# Patient Record
Sex: Male | Born: 1940
Health system: Southern US, Community
[De-identification: ages and names within clinical notes are randomized; demographics above are authoritative.]

## PROBLEM LIST (undated history)

## (undated) DIAGNOSIS — N529 Male erectile dysfunction, unspecified: Secondary | ICD-10-CM

## (undated) DIAGNOSIS — I241 Dressler's syndrome: Secondary | ICD-10-CM

## (undated) DIAGNOSIS — I00 Rheumatic fever without heart involvement: Secondary | ICD-10-CM

## (undated) DIAGNOSIS — R7303 Prediabetes: Secondary | ICD-10-CM

## (undated) DIAGNOSIS — G8929 Other chronic pain: Secondary | ICD-10-CM

## (undated) DIAGNOSIS — R51 Headache: Secondary | ICD-10-CM

## (undated) DIAGNOSIS — I255 Ischemic cardiomyopathy: Secondary | ICD-10-CM

## (undated) DIAGNOSIS — I480 Paroxysmal atrial fibrillation: Secondary | ICD-10-CM

## (undated) DIAGNOSIS — R519 Headache, unspecified: Secondary | ICD-10-CM

## (undated) DIAGNOSIS — I2510 Atherosclerotic heart disease of native coronary artery without angina pectoris: Secondary | ICD-10-CM

## (undated) DIAGNOSIS — R430 Anosmia: Secondary | ICD-10-CM

## (undated) DIAGNOSIS — R7989 Other specified abnormal findings of blood chemistry: Secondary | ICD-10-CM

## (undated) DIAGNOSIS — K760 Fatty (change of) liver, not elsewhere classified: Secondary | ICD-10-CM

## (undated) DIAGNOSIS — C61 Malignant neoplasm of prostate: Secondary | ICD-10-CM

## (undated) HISTORY — DX: Headache: R51

## (undated) HISTORY — DX: Rheumatic fever without heart involvement: I00

## (undated) HISTORY — PX: TONSILLECTOMY: SUR1361

## (undated) HISTORY — DX: Headache, unspecified: R51.9

## (undated) HISTORY — PX: PROSTATECTOMY: SHX69

## (undated) HISTORY — DX: Other chronic pain: G89.29

## (undated) HISTORY — DX: Male erectile dysfunction, unspecified: N52.9

## (undated) HISTORY — DX: Malignant neoplasm of prostate: C61

## (undated) HISTORY — DX: Fatty (change of) liver, not elsewhere classified: K76.0

## (undated) HISTORY — DX: Anosmia: R43.0

---
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
## 2009-07-07 ENCOUNTER — Emergency Department (HOSPITAL_COMMUNITY): Admission: EM | Admit: 2009-07-07 | Discharge: 2009-07-07 | Payer: Self-pay | Admitting: Family Medicine

## 2010-08-21 ENCOUNTER — Encounter: Payer: Self-pay | Admitting: Internal Medicine

## 2010-09-04 ENCOUNTER — Encounter: Payer: Self-pay | Admitting: Internal Medicine

## 2010-09-05 ENCOUNTER — Encounter: Payer: Self-pay | Admitting: Internal Medicine

## 2010-09-05 ENCOUNTER — Ambulatory Visit
Admission: RE | Admit: 2010-09-05 | Discharge: 2010-09-05 | Payer: Self-pay | Source: Home / Self Care | Attending: Internal Medicine | Admitting: Internal Medicine

## 2010-09-12 ENCOUNTER — Ambulatory Visit: Admission: EM | Admit: 2010-09-12 | Payer: Self-pay | Source: Home / Self Care | Admitting: Cardiology

## 2010-09-18 ENCOUNTER — Emergency Department (HOSPITAL_COMMUNITY): Payer: Medicare Other

## 2010-09-18 ENCOUNTER — Emergency Department (HOSPITAL_COMMUNITY)
Admission: EM | Admit: 2010-09-18 | Discharge: 2010-09-18 | Disposition: A | Discharge status: Home / Self Care | Payer: Medicare Other | Attending: Emergency Medicine | Admitting: Emergency Medicine

## 2010-09-18 DIAGNOSIS — R079 Chest pain, unspecified: Secondary | ICD-10-CM | POA: Insufficient documentation

## 2010-09-18 DIAGNOSIS — R0609 Other forms of dyspnea: Secondary | ICD-10-CM | POA: Insufficient documentation

## 2010-09-18 DIAGNOSIS — R0989 Other specified symptoms and signs involving the circulatory and respiratory systems: Secondary | ICD-10-CM | POA: Insufficient documentation

## 2010-09-18 DIAGNOSIS — Z7982 Long term (current) use of aspirin: Secondary | ICD-10-CM | POA: Insufficient documentation

## 2010-09-18 LAB — CBC
HCT: 43.8 % (ref 39.0–52.0)
Hemoglobin: 14.3 g/dL (ref 13.0–17.0)
RBC: 4.92 MIL/uL (ref 4.22–5.81)
WBC: 8.2 10*3/uL (ref 4.0–10.5)

## 2010-09-18 LAB — DIFFERENTIAL
Basophils Absolute: 0 10*3/uL (ref 0.0–0.1)
Lymphocytes Relative: 30 % (ref 12–46)
Monocytes Absolute: 0.8 10*3/uL (ref 0.1–1.0)
Neutro Abs: 4.7 10*3/uL (ref 1.7–7.7)

## 2010-09-18 LAB — BASIC METABOLIC PANEL
Calcium: 9.3 mg/dL (ref 8.4–10.5)
GFR calc Af Amer: >60 mL/min (ref >60)
GFR calc non Af Amer: >60 mL/min (ref >60)
Sodium: 145 mEq/L (ref 135–145)

## 2010-09-18 LAB — POCT CARDIAC MARKERS
CKMB, poc: 1.8 ng/mL (ref 1.0–8.0)
Myoglobin, poc: 63.3 ng/mL (ref 12–200)
Troponin i, poc: <0.05 ng/mL (ref 0.00–0.09)

## 2010-09-25 NOTE — Assessment & Plan Note (Signed)
Summary: np6/chest pains   Visit Type:  Initial Consult Primary Provider:  Dr Shon Baton  CC:  chest tightness recently.  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 Referred by Dr. [name] for further evaluation of CP.  Denies any h/o known HTN, HL, diabetes, or known CAD. Has never had stress test or cath. Brother has CAD and had CABG at age 3.  Had CT scan chest in 2007 as part of work-up for porstate CA dn told he had "hardening of the arteries."  Chest discomfort began about 2 months ago. Feels as if chest gets tight and constricted. Initially was getting it 3-4x/week which he related to stress. Would last 2-3 hours then resolve. No associated symptoms. Now that stressis better has not had any further episode for 3-4 weeks.   Does not exercise regularly but walks frequently as a real estate and has not had an episode.    Problems Prior to Update: 1)  Chest Pain-unspecified  (ICD-786.50)  Medications Prior to Update: 1)  Vitamin C 500 Mg  Tabs (Ascorbic Acid) .... Once Daily 2)  Aspirin 81 Mg Tbec (Aspirin) .... Take One Tablet By Mouth Daily 3)  Indomethacin Cr 75 Mg Cr-Caps (Indomethacin) .... Two Times A Day As Needed  Current Medications (verified): 1)  Vitamin C 500 Mg  Tabs (Ascorbic Acid) .... Once Daily 2)  Aspirin 81 Mg Tbec (Aspirin) .... Take One Tablet By Mouth Daily 3)  Indomethacin Cr 75 Mg Cr-Caps (Indomethacin) .... Two Times A Day As Needed  Allergies (verified): 1)  ! Pcn  Past History:  Past Medical History: Last updated: 09/04/2010 1. Chest pains 2. Prostate Cancer 3. Gout 4. Rheumatic fever @5  in 1947 5. Hx of Chest tube 6. Headaches 7. Fatty liver 8. Anosmia 9. ED  Past Surgical History: Last updated: 09/04/2010 Prostatectomy Tonsillectomy  Family History: Last updated: 09/05/2010 Family History of Cancer:  Family  History of Coronary Artery Disease:  Family History of Diabetes:  Family History of Hyperlipidemia:  Dad died colon CA 41 DM2, HTN, gout Mom 49 liver and kidney cance Brother CAD with CABG age 64  Social History: Last updated: 09/04/2010 Retired  Tobacco Use - Former.  1994 Alcohol Use - yes -- social Regular Exercise - no Drug Use - no  Risk Factors: Exercise: no (09/04/2010)  Risk Factors: Smoking Status: quit (09/04/2010)  Family History: Reviewed history from 09/04/2010 and no changes required. Family History of Cancer:  Family History of Coronary Artery Disease:  Family History of Diabetes:  Family History of Hyperlipidemia:  Dad died colon CA 25 DM2, HTN, gout Mom 80 liver and kidney cance Brother CAD with CABG age 37  Social History: Reviewed history from 09/04/2010 and no changes required. Retired  Tobacco Use - Former.  1994 Alcohol Use - yes -- social Regular Exercise - no Drug Use - no  Review of Systems       As per HPI and past medical history; otherwise all systems negative.   Vital Signs:  Patient profile:   70 year old male Height:      68.5 inches Weight:      178 pounds BMI:     26.77 Pulse rate:   63 / minute BP sitting:   140 / 66  (left arm) Cuff size:   regular  Vitals Entered By: Mignon Pine, RMA (September 05, 2010 3:28  PM)  Physical Exam  General:  Well appearing. no resp difficulty HEENT: normal Neck: supple. no JVD. Carotids 2+ bilat; no bruits. No lymphadenopathy or thryomegaly appreciated. Cor: PMI nondisplaced. Regular rate & rhythm. No rubs, gallops, murmur. Lungs: clear Abdomen: soft, nontender, nondistended. No hepatosplenomegaly. No bruits or masses. Good bowel sounds. Extremities: no cyanosis, clubbing, rash, edema Neuro: alert & orientedx3, cranial nerves grossly intact. moves all 4 extremities w/o difficulty. affect pleasant    Impression & Recommendations:  Problem # 1:  CHEST PAIN-UNSPECIFIED  (ICD-786.50) CP with typical and atypical features with strong FHX of CAD. Will ned further risk stratifciation. Discussed enrollment into the NIH Promise trial (randomized trial of stress testing vs cardiac CT). He is interested in proceeding will have him screened for enrollment. If testing is + will need cath. Continue ASA 81.   Other Orders: EKG w/ Interpretation (93000) Treadmill (Treadmill)  Patient Instructions: 1)  Your physician has requested that you have an exercise tolerance test.  For further information please visit [email or web address].  Please also follow instruction sheet, as given. 2)  Follow up in 2 months.

## 2010-09-26 ENCOUNTER — Encounter (INDEPENDENT_AMBULATORY_CARE_PROVIDER_SITE_OTHER): Payer: Medicare Other

## 2010-09-26 ENCOUNTER — Encounter: Payer: Self-pay | Admitting: Internal Medicine

## 2010-09-26 ENCOUNTER — Encounter (INDEPENDENT_AMBULATORY_CARE_PROVIDER_SITE_OTHER): Payer: Medicare Other | Admitting: Internal Medicine

## 2010-09-26 DIAGNOSIS — R0989 Other specified symptoms and signs involving the circulatory and respiratory systems: Secondary | ICD-10-CM

## 2010-09-26 DIAGNOSIS — R072 Precordial pain: Secondary | ICD-10-CM

## 2010-10-04 ENCOUNTER — Encounter: Payer: Self-pay | Admitting: Internal Medicine

## 2010-10-09 NOTE — Consult Note (Signed)
Summary: [facility].: Consultation Report  [facility] Consultation Report   Imported By: Roddie Mc 10/04/2010 15:36:40  _____________________________________________________________________  External Attachment:    Type:   Image     Comment:   External Document

## 2010-10-09 NOTE — Letter (Signed)
Summary: [facility] Pt History  [facility] Pt History   Imported By: Roddie Mc 10/04/2010 15:28:10  _____________________________________________________________________  External Attachment:    Type:   Image     Comment:   External Document

## 2010-10-15 NOTE — Medication Information (Signed)
Summary: [facility]: RX Folder  [facility]: RX Folder   Imported By: Roddie Mc 10/04/2010 15:35:36  _____________________________________________________________________  External Attachment:    Type:   Image     Comment:   External Document

## 2010-10-24 ENCOUNTER — Encounter (INDEPENDENT_AMBULATORY_CARE_PROVIDER_SITE_OTHER): Payer: Self-pay | Admitting: *Deleted

## 2010-10-29 ENCOUNTER — Encounter: Payer: Self-pay | Admitting: Internal Medicine

## 2010-10-29 NOTE — Letter (Signed)
Summary: Appointment - [place], [place]  [address] [place]   [place], [place] [address]   Phone: [phone]  Fax: [phone]     October 24, 2010 MRN: [id number]   Pedro Pope [place], [place]  [address]   Dear Pedro Pope,   Due to a change in our office schedule, your appointment on March 27,2012 at 11:15 must be changed.  It is very important that we reach you to reschedule this appointment. We look forward to participating in your health care needs. Please contact us at the number listed above at your earliest convenience to reschedule this appointment.     Sincerely, Designer, fashion/clothing

## 2010-11-12 ENCOUNTER — Ambulatory Visit: Payer: Self-pay | Admitting: Internal Medicine

## 2010-11-25 ENCOUNTER — Ambulatory Visit: Payer: Self-pay | Admitting: Internal Medicine

## 2010-12-12 ENCOUNTER — Ambulatory Visit: Payer: Self-pay | Admitting: Internal Medicine

## 2011-01-06 ENCOUNTER — Ambulatory Visit (INDEPENDENT_AMBULATORY_CARE_PROVIDER_SITE_OTHER): Payer: Medicare Other | Admitting: Internal Medicine

## 2011-01-06 ENCOUNTER — Encounter: Payer: Self-pay | Admitting: Internal Medicine

## 2011-01-06 VITALS — BP 130/68 | HR 58 | Resp 18 | Ht 69.0 in | Wt 177.8 lb

## 2011-01-06 DIAGNOSIS — R079 Chest pain, unspecified: Secondary | ICD-10-CM

## 2011-01-06 NOTE — Assessment & Plan Note (Signed)
Resolved. Treadmill without any evidence of ischemia though exercise tolerance not as good as I would have hoped. My suspicion is that he does have some degree of underlying vascular disease but so far seems to be doing well.  We discussed the possibility of starting a statin to reduce risk of plaque development/rupture but he prefers not to do this. I discussed with Dr. [name] who has followed him closely and recent TC 177 with LDL ~ 100. He will continue to follow and encourage ongoing exercise and diet efforts. Can consider red yeast rice as an adjunct. I will see him back in 6 months.

## 2011-01-06 NOTE — Progress Notes (Signed)
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 Referred by Dr. [name] in 1/12 for further evaluation of CP.  Denies any h/o known HTN, HL, diabetes, or known CAD.  Brother has CAD and had CABG at age 30. He had chest CT in past and was told he had "hardening of his heart arteries."  In February has ETT. Went 6:13 on Bruce protocol. No CP or ECG changes. SBP at peak 220.   Chest discomfort began about 2 months ago. Feels as if chest gets tight and constricted. Initially was getting it 3-4x/week which he related to stress. Would last 2-3 hours then resolve. No associated symptoms. Now that stressis better has not had any further episode for 3-4 weeks.   Returns for routine f/u. After ETT started walking program. Was up to 2.5 miles per day in [facility]  but schedule got too busy. Now walks 1.5 miles. Does it every day. No CP at all. Breathing OK. Feels his CP was due to emotional stress. Continues selling real estate.  ROS: All systems negative except as listed in HPI, PMH and Problem List.  Past Medical History  Diagnosis Date  . Chest pain   . Prostate cancer   . Gout   . Rheumatic fever 1947  . History of chest tube placement   . Chronic headaches   . Fatty liver   . Anosmia   . ED (erectile dysfunction)     Current Outpatient Prescriptions  Medication Sig Dispense Refill  . Ascorbic Acid (VITAMIN C) 500 MG tablet Take 500 mg by mouth daily.        Marland Kitchen aspirin 81 MG tablet Take 81 mg by mouth daily.        . indomethacin (INDOCIN SR) 75 MG CR capsule Take 75 mg by mouth as needed.          PHYSICAL EXAM: Filed Vitals:   01/06/11 1052  BP: 130/68  Pulse: 58  Resp: 18   General:  Well appearing. No resp difficulty HEENT: normal Neck: supple. JVP flat. Carotids 2+ bilaterally; no bruits. No lymphadenopathy or thryomegaly appreciated. Cor: PMI normal. Regular rate & rhythm.  No rubs, gallops or murmurs. Lungs: clear Abdomen: soft, nontender, nondistended. No hepatosplenomegaly. No bruits or masses. Good bowel sounds. Extremities: no cyanosis, clubbing, rash, edema Neuro: alert & orientedx3, cranial nerves grossly intact. Moves all 4 extremities w/o difficulty. Affect pleasant.    ECG: Sinus bradycardia 58 No ST-T wave abnormalities.     ASSESSMENT & PLAN:

## 2011-01-06 NOTE — Patient Instructions (Signed)
Your physician wants you to follow-up in:  6 months. You will receive a reminder letter in the mail two months in advance. If you don't receive a letter, please call our office to schedule the follow-up appointment.   

## 2011-11-10 DIAGNOSIS — R030 Elevated blood-pressure reading, without diagnosis of hypertension: Secondary | ICD-10-CM | POA: Diagnosis not present

## 2011-11-10 DIAGNOSIS — M109 Gout, unspecified: Secondary | ICD-10-CM | POA: Diagnosis not present

## 2011-11-10 DIAGNOSIS — K7689 Other specified diseases of liver: Secondary | ICD-10-CM | POA: Diagnosis not present

## 2011-11-10 DIAGNOSIS — K5900 Constipation, unspecified: Secondary | ICD-10-CM | POA: Diagnosis not present

## 2011-12-04 DIAGNOSIS — Z800 Family history of malignant neoplasm of digestive organs: Secondary | ICD-10-CM | POA: Diagnosis not present

## 2011-12-04 DIAGNOSIS — K5900 Constipation, unspecified: Secondary | ICD-10-CM | POA: Diagnosis not present

## 2011-12-04 DIAGNOSIS — R109 Unspecified abdominal pain: Secondary | ICD-10-CM | POA: Diagnosis not present

## 2012-01-15 DIAGNOSIS — R1084 Generalized abdominal pain: Secondary | ICD-10-CM | POA: Diagnosis not present

## 2012-01-15 DIAGNOSIS — K5730 Diverticulosis of large intestine without perforation or abscess without bleeding: Secondary | ICD-10-CM | POA: Diagnosis not present

## 2012-01-15 DIAGNOSIS — K5900 Constipation, unspecified: Secondary | ICD-10-CM | POA: Diagnosis not present

## 2012-01-15 DIAGNOSIS — Z800 Family history of malignant neoplasm of digestive organs: Secondary | ICD-10-CM | POA: Diagnosis not present

## 2012-01-15 DIAGNOSIS — Z1211 Encounter for screening for malignant neoplasm of colon: Secondary | ICD-10-CM | POA: Diagnosis not present

## 2012-01-15 DIAGNOSIS — Q438 Other specified congenital malformations of intestine: Secondary | ICD-10-CM | POA: Diagnosis not present

## 2012-03-11 DIAGNOSIS — C61 Malignant neoplasm of prostate: Secondary | ICD-10-CM | POA: Diagnosis not present

## 2012-05-13 DIAGNOSIS — K7689 Other specified diseases of liver: Secondary | ICD-10-CM | POA: Diagnosis not present

## 2012-05-13 DIAGNOSIS — R030 Elevated blood-pressure reading, without diagnosis of hypertension: Secondary | ICD-10-CM | POA: Diagnosis not present

## 2012-05-13 DIAGNOSIS — Z0000 Encounter for general adult medical examination without abnormal findings: Secondary | ICD-10-CM | POA: Diagnosis not present

## 2012-05-13 DIAGNOSIS — M109 Gout, unspecified: Secondary | ICD-10-CM | POA: Diagnosis not present

## 2012-05-20 DIAGNOSIS — Z0000 Encounter for general adult medical examination without abnormal findings: Secondary | ICD-10-CM | POA: Diagnosis not present

## 2012-05-20 DIAGNOSIS — K5900 Constipation, unspecified: Secondary | ICD-10-CM | POA: Diagnosis not present

## 2012-05-20 DIAGNOSIS — M1990 Unspecified osteoarthritis, unspecified site: Secondary | ICD-10-CM | POA: Diagnosis not present

## 2012-05-20 DIAGNOSIS — K7689 Other specified diseases of liver: Secondary | ICD-10-CM | POA: Diagnosis not present

## 2012-05-20 DIAGNOSIS — Z23 Encounter for immunization: Secondary | ICD-10-CM | POA: Diagnosis not present

## 2012-12-28 DIAGNOSIS — K7689 Other specified diseases of liver: Secondary | ICD-10-CM | POA: Diagnosis not present

## 2012-12-28 DIAGNOSIS — R51 Headache: Secondary | ICD-10-CM | POA: Diagnosis not present

## 2012-12-28 DIAGNOSIS — R209 Unspecified disturbances of skin sensation: Secondary | ICD-10-CM | POA: Diagnosis not present

## 2012-12-28 DIAGNOSIS — R030 Elevated blood-pressure reading, without diagnosis of hypertension: Secondary | ICD-10-CM | POA: Diagnosis not present

## 2012-12-29 DIAGNOSIS — R51 Headache: Secondary | ICD-10-CM | POA: Diagnosis not present

## 2012-12-29 DIAGNOSIS — R209 Unspecified disturbances of skin sensation: Secondary | ICD-10-CM | POA: Diagnosis not present

## 2013-01-03 DIAGNOSIS — R209 Unspecified disturbances of skin sensation: Secondary | ICD-10-CM | POA: Diagnosis not present

## 2013-01-03 DIAGNOSIS — R51 Headache: Secondary | ICD-10-CM | POA: Diagnosis not present

## 2013-02-22 DIAGNOSIS — D1039 Benign neoplasm of other parts of mouth: Secondary | ICD-10-CM | POA: Diagnosis not present

## 2013-03-11 DIAGNOSIS — N529 Male erectile dysfunction, unspecified: Secondary | ICD-10-CM | POA: Diagnosis not present

## 2013-03-11 DIAGNOSIS — C61 Malignant neoplasm of prostate: Secondary | ICD-10-CM | POA: Diagnosis not present

## 2013-03-11 DIAGNOSIS — Z8546 Personal history of malignant neoplasm of prostate: Secondary | ICD-10-CM | POA: Diagnosis not present

## 2013-03-11 DIAGNOSIS — Z9079 Acquired absence of other genital organ(s): Secondary | ICD-10-CM | POA: Diagnosis not present

## 2013-03-11 DIAGNOSIS — Z923 Personal history of irradiation: Secondary | ICD-10-CM | POA: Diagnosis not present

## 2013-03-11 DIAGNOSIS — Z09 Encounter for follow-up examination after completed treatment for conditions other than malignant neoplasm: Secondary | ICD-10-CM | POA: Diagnosis not present

## 2013-03-14 DIAGNOSIS — K1321 Leukoplakia of oral mucosa, including tongue: Secondary | ICD-10-CM | POA: Diagnosis not present

## 2013-03-17 DIAGNOSIS — K1321 Leukoplakia of oral mucosa, including tongue: Secondary | ICD-10-CM | POA: Diagnosis not present

## 2013-05-17 DIAGNOSIS — M109 Gout, unspecified: Secondary | ICD-10-CM | POA: Diagnosis not present

## 2013-05-17 DIAGNOSIS — R7309 Other abnormal glucose: Secondary | ICD-10-CM | POA: Diagnosis not present

## 2013-08-29 ENCOUNTER — Encounter: Payer: Self-pay | Admitting: Internal Medicine

## 2013-09-01 DIAGNOSIS — K7689 Other specified diseases of liver: Secondary | ICD-10-CM | POA: Diagnosis not present

## 2013-09-01 DIAGNOSIS — Z0000 Encounter for general adult medical examination without abnormal findings: Secondary | ICD-10-CM | POA: Diagnosis not present

## 2013-09-01 DIAGNOSIS — R51 Headache: Secondary | ICD-10-CM | POA: Diagnosis not present

## 2013-09-01 DIAGNOSIS — Z1331 Encounter for screening for depression: Secondary | ICD-10-CM | POA: Diagnosis not present

## 2013-09-01 DIAGNOSIS — K5900 Constipation, unspecified: Secondary | ICD-10-CM | POA: Diagnosis not present

## 2013-09-01 DIAGNOSIS — R030 Elevated blood-pressure reading, without diagnosis of hypertension: Secondary | ICD-10-CM | POA: Diagnosis not present

## 2013-09-01 DIAGNOSIS — M109 Gout, unspecified: Secondary | ICD-10-CM | POA: Diagnosis not present

## 2013-09-01 DIAGNOSIS — R7309 Other abnormal glucose: Secondary | ICD-10-CM | POA: Diagnosis not present

## 2013-09-01 DIAGNOSIS — M1990 Unspecified osteoarthritis, unspecified site: Secondary | ICD-10-CM | POA: Diagnosis not present

## 2013-09-23 DIAGNOSIS — C61 Malignant neoplasm of prostate: Secondary | ICD-10-CM | POA: Diagnosis not present

## 2014-03-10 DIAGNOSIS — C61 Malignant neoplasm of prostate: Secondary | ICD-10-CM | POA: Diagnosis not present

## 2014-03-10 DIAGNOSIS — N529 Male erectile dysfunction, unspecified: Secondary | ICD-10-CM | POA: Diagnosis not present

## 2014-05-18 DIAGNOSIS — S93602A Unspecified sprain of left foot, initial encounter: Secondary | ICD-10-CM | POA: Diagnosis not present

## 2014-09-05 DIAGNOSIS — M109 Gout, unspecified: Secondary | ICD-10-CM | POA: Diagnosis not present

## 2014-09-05 DIAGNOSIS — Z125 Encounter for screening for malignant neoplasm of prostate: Secondary | ICD-10-CM | POA: Diagnosis not present

## 2014-09-05 DIAGNOSIS — K5900 Constipation, unspecified: Secondary | ICD-10-CM | POA: Diagnosis not present

## 2014-09-05 DIAGNOSIS — K760 Fatty (change of) liver, not elsewhere classified: Secondary | ICD-10-CM | POA: Diagnosis not present

## 2014-09-05 DIAGNOSIS — R739 Hyperglycemia, unspecified: Secondary | ICD-10-CM | POA: Diagnosis not present

## 2014-09-05 DIAGNOSIS — Z0000 Encounter for general adult medical examination without abnormal findings: Secondary | ICD-10-CM | POA: Diagnosis not present

## 2014-09-21 DIAGNOSIS — K760 Fatty (change of) liver, not elsewhere classified: Secondary | ICD-10-CM | POA: Diagnosis not present

## 2014-09-21 DIAGNOSIS — R030 Elevated blood-pressure reading, without diagnosis of hypertension: Secondary | ICD-10-CM | POA: Diagnosis not present

## 2014-09-21 DIAGNOSIS — R739 Hyperglycemia, unspecified: Secondary | ICD-10-CM | POA: Diagnosis not present

## 2014-09-21 DIAGNOSIS — Z0000 Encounter for general adult medical examination without abnormal findings: Secondary | ICD-10-CM | POA: Diagnosis not present

## 2014-09-21 DIAGNOSIS — E781 Pure hyperglyceridemia: Secondary | ICD-10-CM | POA: Diagnosis not present

## 2014-09-21 DIAGNOSIS — Z6825 Body mass index (BMI) 25.0-25.9, adult: Secondary | ICD-10-CM | POA: Diagnosis not present

## 2014-09-21 DIAGNOSIS — Z1389 Encounter for screening for other disorder: Secondary | ICD-10-CM | POA: Diagnosis not present

## 2014-09-21 DIAGNOSIS — M1990 Unspecified osteoarthritis, unspecified site: Secondary | ICD-10-CM | POA: Diagnosis not present

## 2014-09-21 DIAGNOSIS — M109 Gout, unspecified: Secondary | ICD-10-CM | POA: Diagnosis not present

## 2014-09-21 DIAGNOSIS — C61 Malignant neoplasm of prostate: Secondary | ICD-10-CM | POA: Diagnosis not present

## 2014-09-22 DIAGNOSIS — C61 Malignant neoplasm of prostate: Secondary | ICD-10-CM | POA: Diagnosis not present

## 2014-10-04 DIAGNOSIS — Z1212 Encounter for screening for malignant neoplasm of rectum: Secondary | ICD-10-CM | POA: Diagnosis not present

## 2014-10-30 ENCOUNTER — Encounter (HOSPITAL_COMMUNITY)
Admission: EM | Disposition: A | Discharge status: Home / Self Care | Payer: PRIVATE HEALTH INSURANCE | Source: Ambulatory Visit | Attending: Cardiovascular Disease

## 2014-10-30 ENCOUNTER — Encounter (HOSPITAL_COMMUNITY): Payer: Self-pay | Admitting: Cardiology

## 2014-10-30 ENCOUNTER — Inpatient Hospital Stay (HOSPITAL_COMMUNITY)
Admission: EM | Admit: 2014-10-30 | Discharge: 2014-11-01 | DRG: 247 | Disposition: A | Discharge status: Home / Self Care | Payer: Medicare Other | Source: Ambulatory Visit | Attending: Cardiovascular Disease | Admitting: Cardiovascular Disease

## 2014-10-30 DIAGNOSIS — I2109 ST elevation (STEMI) myocardial infarction involving other coronary artery of anterior wall: Secondary | ICD-10-CM | POA: Diagnosis not present

## 2014-10-30 DIAGNOSIS — R748 Abnormal levels of other serum enzymes: Secondary | ICD-10-CM | POA: Insufficient documentation

## 2014-10-30 DIAGNOSIS — I2102 ST elevation (STEMI) myocardial infarction involving left anterior descending coronary artery: Secondary | ICD-10-CM | POA: Diagnosis not present

## 2014-10-30 DIAGNOSIS — E785 Hyperlipidemia, unspecified: Secondary | ICD-10-CM | POA: Insufficient documentation

## 2014-10-30 DIAGNOSIS — I255 Ischemic cardiomyopathy: Secondary | ICD-10-CM | POA: Diagnosis present

## 2014-10-30 DIAGNOSIS — Z7982 Long term (current) use of aspirin: Secondary | ICD-10-CM

## 2014-10-30 DIAGNOSIS — M109 Gout, unspecified: Secondary | ICD-10-CM | POA: Diagnosis present

## 2014-10-30 DIAGNOSIS — I2510 Atherosclerotic heart disease of native coronary artery without angina pectoris: Secondary | ICD-10-CM | POA: Diagnosis not present

## 2014-10-30 DIAGNOSIS — Z8546 Personal history of malignant neoplasm of prostate: Secondary | ICD-10-CM

## 2014-10-30 DIAGNOSIS — I10 Essential (primary) hypertension: Secondary | ICD-10-CM | POA: Diagnosis not present

## 2014-10-30 DIAGNOSIS — Q245 Malformation of coronary vessels: Secondary | ICD-10-CM

## 2014-10-30 DIAGNOSIS — I213 ST elevation (STEMI) myocardial infarction of unspecified site: Secondary | ICD-10-CM | POA: Diagnosis not present

## 2014-10-30 DIAGNOSIS — Z87891 Personal history of nicotine dependence: Secondary | ICD-10-CM

## 2014-10-30 DIAGNOSIS — I252 Old myocardial infarction: Secondary | ICD-10-CM

## 2014-10-30 DIAGNOSIS — R079 Chest pain, unspecified: Secondary | ICD-10-CM | POA: Diagnosis present

## 2014-10-30 DIAGNOSIS — Z955 Presence of coronary angioplasty implant and graft: Secondary | ICD-10-CM | POA: Diagnosis not present

## 2014-10-30 DIAGNOSIS — E119 Type 2 diabetes mellitus without complications: Secondary | ICD-10-CM | POA: Diagnosis present

## 2014-10-30 DIAGNOSIS — R51 Headache: Secondary | ICD-10-CM | POA: Diagnosis present

## 2014-10-30 DIAGNOSIS — I209 Angina pectoris, unspecified: Secondary | ICD-10-CM | POA: Diagnosis not present

## 2014-10-30 HISTORY — PX: PERCUTANEOUS CORONARY STENT INTERVENTION (PCI-S): SHX5485

## 2014-10-30 HISTORY — PX: LEFT HEART CATHETERIZATION WITH CORONARY ANGIOGRAM: SHX5451

## 2014-10-30 HISTORY — DX: Atherosclerotic heart disease of native coronary artery without angina pectoris: I25.10

## 2014-10-30 HISTORY — PX: CORONARY ANGIOPLASTY WITH STENT PLACEMENT: SHX49

## 2014-10-30 LAB — BASIC METABOLIC PANEL
Anion gap: 9 (ref 5–15)
BUN: 16 mg/dL (ref 6–23)
CO2: 26 mmol/L (ref 19–32)
CREATININE: 1 mg/dL (ref 0.50–1.35)
Calcium: 8.6 mg/dL (ref 8.4–10.5)
Chloride: 101 mmol/L (ref 96–112)
GFR calc Af Amer: 84 mL/min — ABNORMAL LOW (ref >90)
GFR calc non Af Amer: 73 mL/min — ABNORMAL LOW (ref >90)
Glucose, Bld: 146 mg/dL — ABNORMAL HIGH (ref 70–99)
Potassium: 4.2 mmol/L (ref 3.5–5.1)
Sodium: 136 mmol/L (ref 135–145)

## 2014-10-30 LAB — CBC
HEMATOCRIT: 43.7 % (ref 39.0–52.0)
HEMOGLOBIN: 14.7 g/dL (ref 13.0–17.0)
MCH: 29.5 pg (ref 26.0–34.0)
MCHC: 33.6 g/dL (ref 30.0–36.0)
MCV: 87.8 fL (ref 78.0–100.0)
Platelets: 220 10*3/uL (ref 150–400)
RBC: 4.98 MIL/uL (ref 4.22–5.81)
RDW: 14.2 % (ref 11.5–15.5)
WBC: 15.1 10*3/uL — AB (ref 4.0–10.5)

## 2014-10-30 LAB — TROPONIN I
Troponin I: >80 ng/mL (ref <0.031)
Troponin I: >80 ng/mL (ref <0.031)

## 2014-10-30 LAB — MRSA PCR SCREENING: MRSA BY PCR: NEGATIVE

## 2014-10-30 SURGERY — LEFT HEART CATHETERIZATION WITH CORONARY ANGIOGRAM

## 2014-10-30 MED ORDER — ONDANSETRON HCL 4 MG/2ML IJ SOLN: 4.0000 mg | Freq: Four times a day (QID) | INTRAMUSCULAR | Status: DC | PRN

## 2014-10-30 MED ORDER — ACETAMINOPHEN 325 MG PO TABS: 650.0000 mg | ORAL_TABLET | ORAL | Status: DC | PRN

## 2014-10-30 MED ORDER — ATORVASTATIN CALCIUM 80 MG PO TABS
80.0000 mg | ORAL_TABLET | Freq: Every day | ORAL | Status: DC
  Administered 2014-10-30 – 2014-10-31 (×2): 80 mg via ORAL
  Filled 2014-10-30 (×3): qty 1

## 2014-10-30 MED ORDER — SODIUM CHLORIDE 0.9 % IV SOLN
INTRAVENOUS | Status: AC
  Administered 2014-10-30: 15:00:00 via INTRAVENOUS

## 2014-10-30 MED ORDER — TICAGRELOR 90 MG PO TABS
90.0000 mg | ORAL_TABLET | Freq: Two times a day (BID) | ORAL | Status: DC
  Administered 2014-10-30 – 2014-11-01 (×4): 90 mg via ORAL
  Filled 2014-10-30 (×5): qty 1

## 2014-10-30 MED ORDER — HEPARIN (PORCINE) IN NACL 2-0.9 UNIT/ML-% IJ SOLN
INTRAMUSCULAR | Status: AC
  Filled 2014-10-30: qty 1500

## 2014-10-30 MED ORDER — LIDOCAINE HCL (PF) 1 % IJ SOLN
INTRAMUSCULAR | Status: AC
  Filled 2014-10-30: qty 30

## 2014-10-30 MED ORDER — SODIUM CHLORIDE 0.9 % IV SOLN
1.7500 mg/kg/h | INTRAVENOUS | Status: DC
  Filled 2014-10-30: qty 250

## 2014-10-30 MED ORDER — PNEUMOCOCCAL VAC POLYVALENT 25 MCG/0.5ML IJ INJ
0.5000 mL | INJECTION | INTRAMUSCULAR | Status: AC
  Administered 2014-11-01: 0.5 mL via INTRAMUSCULAR
  Filled 2014-10-30: qty 0.5

## 2014-10-30 MED ORDER — BIVALIRUDIN 250 MG IV SOLR
INTRAVENOUS | Status: AC
  Filled 2014-10-30: qty 250

## 2014-10-30 MED ORDER — TICAGRELOR 90 MG PO TABS
90.0000 mg | ORAL_TABLET | Freq: Two times a day (BID) | ORAL | Status: DC
Start: 2014-10-30 — End: 2014-10-30
  Filled 2014-10-30 (×2): qty 1

## 2014-10-30 MED ORDER — NITROGLYCERIN 1 MG/10 ML FOR IR/CATH LAB
INTRA_ARTERIAL | Status: AC
  Filled 2014-10-30: qty 10

## 2014-10-30 MED ORDER — SODIUM CHLORIDE 0.9 % IV SOLN
1.0000 mg/kg/h | INTRAVENOUS | Status: AC
  Administered 2014-10-30: 1 mg/kg/h via INTRAVENOUS
  Filled 2014-10-30: qty 250

## 2014-10-30 MED ORDER — INFLUENZA VAC SPLIT QUAD 0.5 ML IM SUSY
0.5000 mL | PREFILLED_SYRINGE | INTRAMUSCULAR | Status: AC
Start: 2014-10-31 — End: 2014-11-01
  Administered 2014-11-01: 0.5 mL via INTRAMUSCULAR
  Filled 2014-10-30: qty 0.5

## 2014-10-30 MED ORDER — METOPROLOL TARTRATE 25 MG PO TABS
25.0000 mg | ORAL_TABLET | Freq: Two times a day (BID) | ORAL | Status: DC
  Administered 2014-10-30 – 2014-11-01 (×4): 25 mg via ORAL
  Filled 2014-10-30 (×6): qty 1

## 2014-10-30 MED ORDER — FENTANYL CITRATE 0.05 MG/ML IJ SOLN
INTRAMUSCULAR | Status: AC
  Filled 2014-10-30: qty 2

## 2014-10-30 MED ORDER — ASPIRIN 81 MG PO CHEW
81.0000 mg | CHEWABLE_TABLET | Freq: Every day | ORAL | Status: DC
  Administered 2014-10-31 – 2014-11-01 (×2): 81 mg via ORAL
  Filled 2014-10-30 (×2): qty 1

## 2014-10-30 MED ORDER — MORPHINE SULFATE 2 MG/ML IJ SOLN
1.0000 mg | INTRAMUSCULAR | Status: DC | PRN
  Administered 2014-10-30: 1 mg via INTRAVENOUS
  Filled 2014-10-30: qty 1

## 2014-10-30 MED ORDER — TICAGRELOR 90 MG PO TABS
ORAL_TABLET | ORAL | Status: AC
  Filled 2014-10-30: qty 2

## 2014-10-30 MED ORDER — BIVALIRUDIN 250 MG IV SOLR
0.2500 mg/kg/h | INTRAVENOUS | Status: DC
  Filled 2014-10-30: qty 250

## 2014-10-30 MED ORDER — HEPARIN SODIUM (PORCINE) 1000 UNIT/ML IJ SOLN
INTRAMUSCULAR | Status: AC
  Filled 2014-10-30: qty 1

## 2014-10-30 NOTE — Care Management Note (Signed)
    Page 1 of 1   10/30/2014     3:09:12 PM CARE MANAGEMENT NOTE 10/30/2014  Patient:  Pedro Pope, Pedro Pope   Account Number:  [id number]  Date Initiated:  10/30/2014  Documentation initiated by:  Elissa Hefty  Subjective/Objective Assessment:   adm w mi     Action/Plan:   lives w  wife, pcp dr Shon Baton   Anticipated DC Date:     Anticipated DC Plan:  [place]  CM consult  Medication Assistance      Choice offered to / List presented to:             Status of service:   Medicare Important Message given?   (If response is "NO", the following Medicare IM given date fields will be blank) Date Medicare IM given:   Medicare IM given by:   Date Additional Medicare IM given:   Additional Medicare IM given by:    Discharge Disposition:    Per UR Regulation:  Reviewed for med. necessity/level of care/duration of stay  If discussed at [place] of Stay Meetings, dates discussed:    Comments:  3/14 1508 debbie Kenedie Dirocco rn,bsn gave pt 30day free brilinta card. pt states he does have ins for meds.

## 2014-10-30 NOTE — Progress Notes (Signed)
10/30/2014 1750  Trop I >80.0  Expected result with STEMI  MD aware (Dr. Gwenlyn Found)  Pedro Pope, Carolynn Comment

## 2014-10-30 NOTE — H&P (Signed)
Patient ID: Pedro Pope MRN: [id number], DOB/AGE: Nov 11, 1940   Admit date: 10/30/2014   Primary Physician: Precious Reel, MD Primary Cardiologist: Dr. Gwenlyn Found / Seen in past by Dr. Haroldine Laws (last OV 2012)  Pt. Profile:  74 y/o male with no prior cardiac history admitted for STEMI.   Problem List  Past Medical History  Diagnosis Date  . Chest pain   . Prostate cancer   . Gout   . Rheumatic fever 1947  . History of chest tube placement   . Chronic headaches   . Fatty liver   . Anosmia   . ED (erectile dysfunction)   . CAD (coronary artery disease), native coronary artery   . ST elevation myocardial infarction (STEMI) of inferior wall 10/30/14    STEMI s/p DES to proximal LAD; residual 80 % proximal and 60% mid LCx diease    Past Surgical History  Procedure Laterality Date  . Prostatectomy    . Tonsillectomy    . Coronary angioplasty with stent placement  10/30/14    STEMI s/p DES to proximal LAD; residual 80 % proximal and 60% mid LCx diease     Allergies  Allergies  Allergen Reactions  . Penicillins     HPI  The patient is a 74 y/o male, seen in the past by Dr. Haroldine Laws. He was evaluated in 2012 for chest pain and underwent a exercise tolerance test that was negative for ischemia. He is a former smoker, but quit in 1994. No prior history of HTN, HLD or DM. His family history is notable for CAD, with a brother undergoing CABG at the age of 31. He has a personal history of prostate CA s/p prostatectomy, h/o spontaneous PTX (age 58) and previous rheumatic fever. He is followed medically by Dr. [name].   Today, 10/30/14, he developed severe resting CP around 10:30 am and he called EMS. EKG in the field demonstrated anterolateral ST segment elevations. He was transported urgently to the [facility] cath lab for urgent cardiac catheterization. The procedure was performed by Dr. Gwenlyn Found. He was found to have an occluded proximal LAD.  This was successfully treated with PCI utilizing a  DES. He was also noted to have residual circumflex disease with an 80% proximal and 60% mid stenosis, for which medical therapy has been elected. An LV gram was not performed because the elevated LVEDP. 2-D echo will be obtained. He has been started on DAPT with ASA + Brilinta as well as BB and statin therapy. He is currently resting in CCU and now CP free.    Home Medications  Prior to Admission medications   Medication Sig Start Date End Date Taking? Authorizing Provider  Ascorbic Acid (VITAMIN C) 500 MG tablet Take 500 mg by mouth daily.      Historical Provider, MD  aspirin 81 MG tablet Take 81 mg by mouth daily.      Historical Provider, MD  indomethacin (INDOCIN SR) 75 MG CR capsule Take 75 mg by mouth as needed.     Historical Provider, MD    Family History  Family History  Problem Relation Age of Onset  . Cancer    . Coronary artery disease Brother 45    s/p CABG  . Diabetes    . Hyperlipidemia    . Hypertension    . Gout      Social History  History   Social History  . Marital Status: Married    Spouse Name: N/A  . Number of Children:  N/A  . Years of Education: N/A   Occupational History  . Not on file.   Social History Main Topics  . Smoking status: Former Smoker    Quit date: 08/18/1992  . Smokeless tobacco: Not on file  . Alcohol Use: Yes     Comment: social  . Drug Use: No  . Sexual Activity: Not on file   Other Topics Concern  . Not on file   Social History Narrative     Review of Systems General:  No chills, fever, night sweats or weight changes.  Cardiovascular:  No chest pain, dyspnea on exertion, edema, orthopnea, palpitations, paroxysmal nocturnal dyspnea. Dermatological: No rash, lesions/masses Respiratory: No cough, dyspnea Urologic: No hematuria, dysuria Abdominal:   No nausea, vomiting, diarrhea, bright red blood per rectum, melena, or hematemesis Neurologic:  No visual changes, wkns, changes in mental status. All other systems  reviewed and are otherwise negative except as noted above.  Physical Exam  Blood pressure 139/78, pulse 77, resp. rate 18, weight 178 lb 9.2 oz (81 kg), SpO2 99 %.  General: Pleasant, NAD Psych: Normal affect. Neuro: Alert and oriented X 3. Moves all extremities spontaneously. HEENT: Normal  Neck: Supple without bruits or JVD. Lungs:  Resp regular and unlabored, CTA. Heart: RRR no s3, s4, or murmurs. Abdomen: Soft, non-tender, non-distended, BS + x 4.  Extremities: No clubbing, cyanosis or edema. DP/PT/Radials 2+ and equal bilaterally.  Labs  Troponin (Point of Care Test) No results for input(s): TROPIPOC in the last 72 hours. No results for input(s): CKTOTAL, CKMB, TROPONINI in the last 72 hours. Lab Results  Component Value Date   WBC 8.2 09/18/2010   HGB 14.3 09/18/2010   HCT 43.8 09/18/2010   MCV 89.0 09/18/2010   PLT 218 09/18/2010   No results for input(s): NA, K, CL, CO2, BUN, CREATININE, CALCIUM, PROT, BILITOT, ALKPHOS, ALT, AST, GLUCOSE in the last 168 hours.  Invalid input(s): LABALBU No results found for: CHOL, HDL, LDLCALC, TRIG No results found for: DDIMER   Radiology/Studies  No results found.  ECG  Initial EKG: anterolateral ST elevations  Cath Report HEMODYNAMICS:   AO SYSTOLIC/AO DIASTOLIC: 025/42  LV SYSTOLIC/LV DIASTOLIC: 706/23  ANGIOGRAPHIC RESULTS:   1. Left main; normal  2. LAD; occluded proximally. This was the infarct related vessel 3. Left circumflex; dominant with an 80% fairly focal proximal AV groove stenosis and 60% mid.  4. Right coronary artery; nondominant with a 90% mid stenosis 5. Left ventriculography; was not performed  Echocardiogram - pending   ASSESSMENT AND PLAN   Active Problems:   Chest pain   ST elevation myocardial infarction (STEMI) involving other coronary artery of anterior wall - STEMI s/p DES to proximal LAD; residual 80 % proximal and 60% mid LCx diease   1. Anterior Wall STEMI: secondary to  acute proximal LAD occlusion. S/p PCI + DES. Also with moderate residual left circumflex disease to be treated medically. Now CP free. On DAPT with ASA + Brilinta. Will need to continue for a minimum of 1 year given DES placement.  On BB + statin for secondary prevention. Will try to initiate ACE-I therapy prior to discharge if BP allows. 2D echo pending. Will monitor in CCU on telemetry. Check fasting lipid panel, LFTs and Hgb A1c in the am. Will intiatiate Phase I Cardiac rehab prior to discharge.    Janee Morn, PA-C 10/30/2014, 2:35 PM Agree with note written by Ellen Henri  [facility]  Mr. Benkert developed chest pain at  10:30 this morning. He called EMS. His EKG showed anterolateral ST segment elevation. He was brought to the emergency room at which time I reviewed his EKG and obtained his history and agreed with the diagnosis. He is brought emergently to the gray catheterization laboratory for angiography and intervention. He had no cardiac risk factors.  Lorretta Harp 10/30/2014 3:09 PM

## 2014-10-30 NOTE — CV Procedure (Signed)
Pedro Pope is a 74 y.o. male    [id number] LOCATION:  FACILITY: [facility]  PHYSICIAN: Quay Burow, M.D. February 21, 1941   DATE OF PROCEDURE:  10/30/2014  DATE OF DISCHARGE:     CARDIAC CATHETERIZATION     History obtained from chart review.Mr. Tisdale is a 74 year old married Caucasian male without prior cardiac history. He is on no medications. He developed chest pain at 10:30 this morning and called EMS. He was found to have anterolateral ST segment elevation and was brought urgently to the Cath Lab to the emergency room for urgent cardiac catheterization plus or minus intervention.   PROCEDURE DESCRIPTION:   The patient was brought to the second floor [facility] [place] Cardiac cath lab in the postabsorptive state. He was premedicated with fentanyl IV. His right wristwas prepped and shaved in usual sterile fashion. Xylocaine 1% was used for local anesthesia. A 5/6 French sheath was inserted into the right radial artery using standard Seldinger technique. The patient received  4000 units  of heparin  intravenously.  Radial cocktail was administered through the SideArm sheath. A 5 [place] TIG catheter was used for selective coronary angiography. A 5 French Patel catheter was used to obtain LV pressures. An LV gram was not performed because of elevated LVEDP. Visipaque was used for the entirety of the case. Retrograde aortic, left ventricular and pullback pressures were recorded.    HEMODYNAMICS:    AO SYSTOLIC/AO DIASTOLIC: 174/08   LV SYSTOLIC/LV DIASTOLIC: 144/81  ANGIOGRAPHIC RESULTS:   1. Left main; normal  2. LAD; occluded proximally. This was the infarct related vessel 3. Left circumflex; dominant with an 80% fairly focal proximal AV groove stenosis and 60% mid.  4. Right coronary artery; nondominant with a 90% mid stenosis 5. Left ventriculography; was not performed  IMPRESSION:Mr. Nix has an occluded proximal LAD and it is having an anterolateral STEMI. Will proceed with PCI  and stenting using Brilenta, Angiomax, and drug eluding stent.  Procedure description: The patient received Intermedics bolus and infusion with an ACT of 571. Total contrast Moser to the patient was 245 mL.The LAD guide damped the left main and therefore sidehole catheter was used. The lesion was crossed with a 014 per water guidewire and antegrade flow is reestablished without 2 mm balloon. The door to balloon time was 45 minutes. Following this predilatation was upgraded to 2.5 x 15 mm because of inability to pass a stent and a 3 mm x 23 mm long Xience drug-eluting stent was then carefully positioned and deployed at 16 atm. This was postdilated with a 3.25 mm x 20 mm long noncompliant balloon at 16 atm (3.36 mm) resulting reduction of total occlusion to 0% residual. 200 mg of intra-articular glycerin was administered. There did appear to be a midsystolic myocardial bridge. There appeared to be some hypodensity at the origin of the LAD. The stent began to 3 mm after the takeoff of the LAD but there was TIMI-3 flow after reviewing this with Dr. Claiborne Billings I elected not to attempt to re-intervene on the origin of the LAD given that this was a left dominant system and it was at the bifurcation.  Overall impression: successful direct intervention of an occluded proximal LAD with drug-eluting stent, Angiomax and double antiplatelet therapy using Brilenta. An LV gram was not performed because the elevated LVEDP. 2-D echo will be obtained. Patient will be treated with standard post-MI medications including a beta blocker, statin drug. Medical therapy will be recommended for the residual circumflex disease at this time.  The radial sheath was removed and a TR band was placed on the right breast to achieve patent hemostasis. The patient left the lab in stable condition.  Lorretta Harp MD, [facility] 10/30/2014 12:58 PM

## 2014-10-31 ENCOUNTER — Encounter (HOSPITAL_COMMUNITY): Payer: Self-pay | Admitting: Cardiovascular Disease

## 2014-10-31 DIAGNOSIS — I209 Angina pectoris, unspecified: Secondary | ICD-10-CM

## 2014-10-31 DIAGNOSIS — I10 Essential (primary) hypertension: Secondary | ICD-10-CM

## 2014-10-31 DIAGNOSIS — E785 Hyperlipidemia, unspecified: Secondary | ICD-10-CM

## 2014-10-31 DIAGNOSIS — I213 ST elevation (STEMI) myocardial infarction of unspecified site: Secondary | ICD-10-CM

## 2014-10-31 LAB — TROPONIN I

## 2014-10-31 LAB — CBC
HCT: 41.8 % (ref 39.0–52.0)
HEMOGLOBIN: 14 g/dL (ref 13.0–17.0)
MCH: 29.4 pg (ref 26.0–34.0)
MCHC: 33.5 g/dL (ref 30.0–36.0)
MCV: 87.6 fL (ref 78.0–100.0)
Platelets: 205 10*3/uL (ref 150–400)
RBC: 4.77 MIL/uL (ref 4.22–5.81)
RDW: 14.3 % (ref 11.5–15.5)
WBC: 17.5 10*3/uL — ABNORMAL HIGH (ref 4.0–10.5)

## 2014-10-31 LAB — BASIC METABOLIC PANEL
Anion gap: 10 (ref 5–15)
BUN: 16 mg/dL (ref 6–23)
CO2: 23 mmol/L (ref 19–32)
Calcium: 8.7 mg/dL (ref 8.4–10.5)
Chloride: 104 mmol/L (ref 96–112)
Creatinine, Ser: 0.89 mg/dL (ref 0.50–1.35)
GFR calc Af Amer: >90 mL/min (ref >90)
GFR, EST NON AFRICAN AMERICAN: 83 mL/min — AB (ref >90)
Glucose, Bld: 149 mg/dL — ABNORMAL HIGH (ref 70–99)
Potassium: 4 mmol/L (ref 3.5–5.1)
Sodium: 137 mmol/L (ref 135–145)

## 2014-10-31 LAB — LIPID PANEL
CHOL/HDL RATIO: 5.4 ratio
Cholesterol: 158 mg/dL (ref 0–200)
HDL: 29 mg/dL — ABNORMAL LOW (ref >39)
LDL Cholesterol: 100 mg/dL — ABNORMAL HIGH (ref 0–99)
Triglycerides: 145 mg/dL (ref <150)
VLDL: 29 mg/dL (ref 0–40)

## 2014-10-31 LAB — HEPATIC FUNCTION PANEL
ALBUMIN: 3.3 g/dL — AB (ref 3.5–5.2)
ALK PHOS: 48 U/L (ref 39–117)
ALT: 76 U/L — AB (ref 0–53)
AST: 383 U/L — ABNORMAL HIGH (ref 0–37)
Bilirubin, Direct: 0.2 mg/dL (ref 0.0–0.5)
Indirect Bilirubin: 1.1 mg/dL — ABNORMAL HIGH (ref 0.3–0.9)
Total Bilirubin: 1.3 mg/dL — ABNORMAL HIGH (ref 0.3–1.2)
Total Protein: 5.6 g/dL — ABNORMAL LOW (ref 6.0–8.3)

## 2014-10-31 LAB — POCT ACTIVATED CLOTTING TIME: ACTIVATED CLOTTING TIME: 571 s

## 2014-10-31 MED ORDER — PERFLUTREN LIPID MICROSPHERE
1.0000 mL | INTRAVENOUS | Status: AC | PRN
  Administered 2014-10-31: 2 mL via INTRAVENOUS
  Filled 2014-10-31: qty 10

## 2014-10-31 MED ORDER — PERFLUTREN LIPID MICROSPHERE
INTRAVENOUS | Status: AC
  Filled 2014-10-31: qty 10

## 2014-10-31 MED FILL — Sodium Chloride IV Soln 0.9%: INTRAVENOUS | Qty: 50 | Status: AC

## 2014-10-31 NOTE — Progress Notes (Signed)
  Echocardiogram 2D Echocardiogram with Definity has been performed.  Diamond Nickel 10/31/2014, 9:32 AM

## 2014-10-31 NOTE — Progress Notes (Signed)
CARDIAC REHAB PHASE I   PRE:  Rate/Rhythm: 70 SR PVCs  BP:  Supine: 115/78  Sitting:   Standing:    SaO2:   MODE:  Ambulation: 350 ft   POST:  Rate/Rhythm: 84 SR PVCs  BP:  Supine: 117/55  Sitting:   Standing:    SaO2: 94%RA 1105-1235 Pt walked 350 ft with asst x 1 with slow steady gait. Tired by end of walk and back to bed but denied CP. Tolerated well for first walk. Encouraged walks with RN later. MI education completed with pt and son who voiced understanding. Pt does not like to take meds so I stressed at least three or four times importance of brilinta with stent, reasoning for NTG if needed for CP, reasoning for beta blocker, and statin. Stressed to pt that he must let cardiologist know if he has any concerns but cannot just stop meds. Pt has brilinta booklet. Discussed CRP 2 and pt gave permission for referral to [place]. Reviewed heart healthy diet choices. Son recorded session per phone and I was not aware until end of teaching. Stated wanted mother to hear education. Will follow up tomorrow to increase distance with walk.   Pedro Good, RN BSN  10/31/2014 12:30 PM

## 2014-10-31 NOTE — Progress Notes (Signed)
Patient Name: Pedro Pope Date of Encounter: 10/31/2014  Active Problems:   Chest pain   ST elevation myocardial infarction (STEMI) involving other coronary artery of anterior wall - STEMI s/p DES to proximal LAD; residual 80 % proximal and 60% mid LCx diease   Length of Stay: 1  SUBJECTIVE  The patient felt some mild chest pain yesterday night, but it has resolved. He feels very tired today.   CURRENT MEDS . aspirin  81 mg Oral Daily  . atorvastatin  80 mg Oral q1800  . Influenza vac split quadrivalent PF  0.5 mL Intramuscular Tomorrow-1000  . metoprolol tartrate  25 mg Oral BID  . pneumococcal 23 valent vaccine  0.5 mL Intramuscular Tomorrow-1000  . ticagrelor  90 mg Oral BID   OBJECTIVE  Filed Vitals:   10/31/14 0600 10/31/14 0700 10/31/14 0800 10/31/14 0900  BP: 113/48 113/49 103/43 105/40  Pulse: 67 70 66 70  Temp: 98.6 F (37 C) 97.8 F (36.6 C)    TempSrc: Oral Oral    Resp: 14 20 18    Height:      Weight:      SpO2: 94% 93% 95% 96%    Intake/Output Summary (Last 24 hours) at 10/31/14 0953 Last data filed at 10/31/14 0800  Gross per 24 hour  Intake 2075.29 ml  Output   1750 ml  Net 325.29 ml   Filed Weights   10/30/14 1100 10/30/14 1505  Weight: 178 lb 9.2 oz (81 kg) 173 lb 4.5 oz (78.6 kg)   PHYSICAL EXAM  General: Pleasant, NAD. Neuro: Alert and oriented X 3. Moves all extremities spontaneously. Psych: Normal affect. HEENT:  Normal  Neck: Supple without bruits or JVD. Lungs:  Resp regular and unlabored, CTA. Heart: RRR no s3, s4, or murmurs. Abdomen: Soft, non-tender, non-distended, BS + x 4.  Extremities: No clubbing, cyanosis or edema. DP/PT/Radials 2+ and equal bilaterally.  Accessory Clinical Findings  CBC  Recent Labs  10/30/14 1830 10/31/14 0313  WBC 15.1* 17.5*  HGB 14.7 14.0  HCT 43.7 41.8  MCV 87.8 87.6  PLT 220 024   Basic Metabolic Panel  Recent Labs  10/30/14 1830 10/31/14 0313  NA 136 137  K 4.2 4.0  CL  101 104  CO2 26 23  GLUCOSE 146* 149*  BUN 16 16  CREATININE 1.00 0.89  CALCIUM 8.6 8.7   Liver Function Tests  Recent Labs  10/31/14 0313  AST 383*  ALT 76*  ALKPHOS 48  BILITOT 1.3*  PROT 5.6*  ALBUMIN 3.3*   Cardiac Enzymes  Recent Labs  10/30/14 1540 10/30/14 1830 10/31/14 0055  TROPONINI >80.00* >80.00* >80.00*   Fasting Lipid Panel  Recent Labs  10/31/14 0313  CHOL 158  HDL 29*  LDLCALC 100*  TRIG 145  CHOLHDL 5.4   Radiology/Studies  No results found.  TELE: SR, PVCs  Left cardiac cath: 10/30/2014 ANGIOGRAPHIC RESULTS:   1. Left main; normal  2. LAD; occluded proximally. This was the infarct related vessel 3. Left circumflex; dominant with an 80% fairly focal proximal AV groove stenosis and 60% mid.  4. Right coronary artery; nondominant with a 90% mid stenosis 5. Left ventriculography; was not performed   ASSESSMENT AND PLAN  74 year old male without prior cardiac history, on no prior meds, who developed chest pain at 10:30 am yesterday.  1. CAD, s/p STEMI with occluded prox LAD, successful direct intervention of an occluded proximal LAD with drug-eluting stent,  Angiomax and double antiplatelet therapy  using Brilenta. An LV gram was not performed because the elevated LVEDP.  Echo is still pending. Troponin > 80. - continue ASA 81 mg po daily, ticagrelor 90 mg po BID, atorvastatin 80 mg po QHS, metoprolol 25 mg po daily. - start lisinopril 2.5 mg po daily tomorrow as he feels tired today and his BP is low.  - the patient has residual  80% fairly focal proximal AV groove stenosis and 60% mid stenosis in dominant left circumflex artery, per Dr Gwenlyn Found, cath if symptoms persists or schedule an outpatient stress test to evaluate for ischemia. Otherwise medical therapy.  We will keep him in CCU today, anticipated discharge tomorrow.    Signed, Dorothy Spark MD, [facility] 10/31/2014

## 2014-11-01 ENCOUNTER — Encounter (HOSPITAL_COMMUNITY): Payer: Self-pay | Admitting: Physician Assistant

## 2014-11-01 DIAGNOSIS — R748 Abnormal levels of other serum enzymes: Secondary | ICD-10-CM | POA: Insufficient documentation

## 2014-11-01 DIAGNOSIS — I2109 ST elevation (STEMI) myocardial infarction involving other coronary artery of anterior wall: Secondary | ICD-10-CM | POA: Diagnosis not present

## 2014-11-01 DIAGNOSIS — I255 Ischemic cardiomyopathy: Secondary | ICD-10-CM

## 2014-11-01 DIAGNOSIS — E785 Hyperlipidemia, unspecified: Secondary | ICD-10-CM | POA: Insufficient documentation

## 2014-11-01 DIAGNOSIS — Z955 Presence of coronary angioplasty implant and graft: Secondary | ICD-10-CM

## 2014-11-01 LAB — COMPREHENSIVE METABOLIC PANEL
ALT: 48 U/L (ref 0–53)
AST: 121 U/L — ABNORMAL HIGH (ref 0–37)
Albumin: 3.4 g/dL — ABNORMAL LOW (ref 3.5–5.2)
Alkaline Phosphatase: 47 U/L (ref 39–117)
Anion gap: 7 (ref 5–15)
BUN: 21 mg/dL (ref 6–23)
CO2: 26 mmol/L (ref 19–32)
Calcium: 8.9 mg/dL (ref 8.4–10.5)
Chloride: 102 mmol/L (ref 96–112)
Creatinine, Ser: 1.14 mg/dL (ref 0.50–1.35)
GFR calc Af Amer: 72 mL/min — ABNORMAL LOW (ref >90)
GFR calc non Af Amer: 62 mL/min — ABNORMAL LOW (ref >90)
Glucose, Bld: 125 mg/dL — ABNORMAL HIGH (ref 70–99)
Potassium: 3.9 mmol/L (ref 3.5–5.1)
Sodium: 135 mmol/L (ref 135–145)
Total Bilirubin: 2 mg/dL — ABNORMAL HIGH (ref 0.3–1.2)
Total Protein: 6.5 g/dL (ref 6.0–8.3)

## 2014-11-01 LAB — HEMOGLOBIN A1C
Hgb A1c MFr Bld: 6.1 % — ABNORMAL HIGH (ref 4.8–5.6)
Mean Plasma Glucose: 128 mg/dL

## 2014-11-01 MED ORDER — LISINOPRIL 2.5 MG PO TABS: 2.5000 mg | ORAL_TABLET | Freq: Every day | ORAL | Status: DC

## 2014-11-01 MED ORDER — METOPROLOL TARTRATE 25 MG PO TABS: 12.5000 mg | ORAL_TABLET | Freq: Two times a day (BID) | ORAL | Status: DC

## 2014-11-01 MED ORDER — TICAGRELOR 90 MG PO TABS: 90.0000 mg | ORAL_TABLET | Freq: Two times a day (BID) | ORAL | Status: DC

## 2014-11-01 MED ORDER — METOPROLOL TARTRATE 12.5 MG HALF TABLET
12.5000 mg | ORAL_TABLET | Freq: Two times a day (BID) | ORAL | Status: DC
  Filled 2014-11-01: qty 1

## 2014-11-01 MED ORDER — NITROGLYCERIN 0.4 MG SL SUBL: 0.4000 mg | SUBLINGUAL_TABLET | SUBLINGUAL | Status: DC | PRN

## 2014-11-01 MED ORDER — ATORVASTATIN CALCIUM 10 MG PO TABS: 10.0000 mg | ORAL_TABLET | Freq: Every day | ORAL | Status: DC

## 2014-11-01 MED ORDER — ATORVASTATIN CALCIUM 10 MG PO TABS
10.0000 mg | ORAL_TABLET | Freq: Every day | ORAL | Status: DC
  Filled 2014-11-01: qty 1

## 2014-11-01 MED ORDER — ASPIRIN 81 MG PO CHEW
81.0000 mg | CHEWABLE_TABLET | Freq: Every day | ORAL | Status: DC
Start: 2014-11-01 — End: 2015-02-07

## 2014-11-01 NOTE — Discharge Instructions (Signed)
Acute Coronary Syndrome Acute coronary syndrome (ACS) is an urgent problem in which the blood and oxygen supply to the heart is critically deficient. ACS requires hospitalization because one or more coronary arteries may be blocked. ACS represents a range of conditions including:  Previous angina that is now unstable, lasts longer, happens at rest, or is more intense.  A heart attack, with heart muscle cell injury and death. There are three vital coronary arteries that supply the heart muscle with blood and oxygen so that it can pump blood effectively. If blockages to these arteries develop, blood flow to the heart muscle is reduced. If the heart does not get enough blood, angina may occur as the first warning sign. SYMPTOMS   The most common signs of angina include:  Tightness or squeezing in the chest.  Feeling of heaviness on the chest.  Discomfort in the arms, neck, back, or jaw.  Shortness of breath and nausea.  Cold, wet skin.  Angina is usually brought on by physical effort or excitement which increase the oxygen needs of the heart. These states increase the blood flow needs of the heart beyond what can be delivered.  Other symptoms that are not as common include:  Fatigue  Unexplained feelings of nervousness or anxiety  Weakness  Diarrhea  Sometimes, you may not have noticed any symptoms at all but still suffered a cardiac injury. TREATMENT   Medicines to help discomfort may include nitroglycerin (nitro) in the form of tablets or a spray for rapid relief, or longer-acting forms such as cream, patches, or capsules. (Be aware that there are many side effects and possible interactions with other drugs).  Other medicines may be used to help the heart pump better.  Procedures to open blocked arteries including angioplasty or stent placement to keep the arteries open.  Open heart surgery may be needed when there are many blockages or they are in critical locations that  are best treated with surgery. HOME CARE INSTRUCTIONS   Do not use any tobacco products including cigarettes, chewing tobacco, or electronic cigarettes.  Take one baby or adult aspirin daily, if your health care provider advises. This helps reduce the risk of a heart attack.  It is very important that you follow the angina treatment prescribed by your health care provider. Make arrangements for proper follow-up care.  Eat a heart healthy diet with salt and fat restrictions as advised.  Regular exercise is good for you as long as it does not cause discomfort. Do not begin any new type of exercise until you check with your health care provider.  If you are overweight, you should lose weight.  Try to maintain normal blood lipid levels.  Keep your blood pressure under control as recommended by your health care provider.  You should tell your health care provider right away about any increase in the severity or frequency of your chest discomfort or angina attacks. When you have angina, you should stop what you are doing and sit down. This may bring relief in 3 to 5 minutes. If your health care provider has prescribed nitro, take it as directed.  If your health care provider has given you a follow-up appointment, it is very important to keep that appointment. Not keeping the appointment could result in a chronic or permanent injury, pain, and disability. If there is any problem keeping the appointment, you must call back to this facility for assistance. SEEK IMMEDIATE MEDICAL CARE IF:   You develop nausea, vomiting, or shortness  of breath.  You feel faint, lightheaded, or pass out.  Your chest discomfort gets worse.  You are sweating or experience sudden profound fatigue.  You do not get relief of your chest pain after 3 doses of nitro.  Your discomfort lasts longer than 15 minutes. MAKE SURE YOU:   Understand these instructions.  Will watch your condition.  Will get help right  away if you are not doing well or get worse.  Take all medicines as directed by your health care provider. Document Released: 08/04/2005 Document Revised: 08/09/2013 Document Reviewed: 12/06/2013 [facility] Patient Information 2015 [place], [place]. This information is not intended to replace advice given to you by your health care provider. Make sure you discuss any questions you have with your health care provider.  No driving for 48 hours. No lifting over 5 lbs for 1 week. No sexual activity for 1 week.Keep procedure site clean & dry. If you notice increased pain, swelling, bleeding or pus, call/return!  You may shower, but no soaking baths/hot tubs/pools for 1 week.

## 2014-11-01 NOTE — Discharge Summary (Signed)
Discharge Summary   Patient ID: Pedro Pope,  MRN: [id number], DOB/AGE: 1940/09/16 74 y.o.  Admit date: 10/30/2014 Discharge date: 11/01/2014  Primary Care Provider: Precious Reel Primary Cardiologist: Dr. Gwenlyn Found  Discharge Diagnoses Principal Problem:   ST elevation myocardial infarction (STEMI) involving other coronary artery of anterior wall - STEMI s/p DES to proximal LAD; residual 80 % proximal and 60% mid LCx diease Active Problems:   Stented coronary artery   Hyperlipidemia   Cardiomyopathy, ischemic   Elevated liver enzymes   Allergies Allergies  Allergen Reactions  . Penicillins Anaphylaxis  . Corn Syrup [Glucose] Other (See Comments)    High fructose corn syrup causes gout  . Aspirin Adult Low [Aspirin] Itching and Rash    Dr. [name] suspects that long term caused a rash, short term use appears to be ok    Procedures  Echocardiogram 10/31/2014 LV EF: 40% -  45%  ------------------------------------------------------------------- Indications:   MI - acute 410.91.  ------------------------------------------------------------------- History:  PMH:  Chest pain.  ------------------------------------------------------------------- Study Conclusions  - Left ventricle: The cavity size was normal. Wall thickness was increased in a pattern of mild LVH. Systolic function was mildly to moderately reduced. The estimated ejection fraction was in the range of 40% to 45%. Doppler parameters are consistent with pseudonormal left ventricular relaxation (grade 2 diastolic dysfunction). The E/A ratio is 1.8, the E/e&' ratio is >15, suggesting elevated LV filling pressure. - Aortic valve: Trileaflet. Sclerosis without stenosis. There was no regurgitation. - Aorta: The aorta was mildly calcified. - Mitral valve: Calcified annulus and anterior leaflet. Mild regurgitation. - Left atrium: Mildly dilated at 35 ml/m2. - Right atrium: The atrium was at the  upper limits of normal in size. - Tricuspid valve: There was trivial regurgitation. - Pulmonary arteries: PA peak pressure: 39 mm Hg (S). - Inferior vena cava: The vessel was normal in size. The respirophasic diameter changes were in the normal range (>= 50%), consistent with normal central venous pressure.  Impressions:  - LVEF 40-45%, anterior, anteroseptal and apical hypokinesis, grade 2 diastolic dysfunction with elevated left and right heart filling pressures, mild LAD, mild MR, trivial TR, RVSP 39 mmHg.    Cardiac catheterization 10/30/2014  HEMODYNAMICS:   AO SYSTOLIC/AO DIASTOLIC: 338/25  LV SYSTOLIC/LV DIASTOLIC: 053/97  ANGIOGRAPHIC RESULTS:   1. Left main; normal  2. LAD; occluded proximally. This was the infarct related vessel 3. Left circumflex; dominant with an 80% fairly focal proximal AV groove stenosis and 60% mid.  4. Right coronary artery; nondominant with a 90% mid stenosis 5. Left ventriculography; was not performed  IMPRESSION:Mr. Aungst has an occluded proximal LAD and it is having an anterolateral STEMI. Will proceed with PCI and stenting using Brilenta, Angiomax, and drug eluding stent.  Overall impression: successful direct intervention of an occluded proximal LAD with drug-eluting stent, Angiomax and double antiplatelet therapy using Brilenta. An LV gram was not performed because the elevated LVEDP. 2-D echo will be obtained. Patient will be treated with standard post-MI medications including a beta blocker, statin drug. Medical therapy will be recommended for the residual circumflex disease at this time. The radial sheath was removed and a TR band was placed on the right breast to achieve patent hemostasis. The patient left the lab in stable condition.    Hospital Course  The patient is a 74 year old male with no past cardiac history who presented on 10/30/2014 as anterolateral STEMI. He was previously evaluated for chest pain 2012 and  underwent exercise tolerance test that was negative  for ischemia. He is a former smoker however quit in 1994. He has no past history of hypertension, hyperlipidemia and diabetes. His family history is notable for CAD with a brother undergoing CABG at age 18. According to records, he developed severe resting chest pain around 10:30 AM and called EMS. EKG in the field demonstrated anterolateral ST elevation, he was transported directly to [facility] cath lab for urgent cardiac catheterization. Cardiac catheterization showed three-vessel disease with occluded proximal LAD, 80% focal proximal AV groove stenosis in the left circumflex, 60% mid left circumflex stenosis, 90% nondominant mid RCA stenosis. The occluded proximal LAD was treated with drug-eluting stent (3 mm x 23 mm long Xience drug-eluting stent), Angiomax and antiplatelet therapy using Brilinta. LV gram was not performed because elevated LVEDP. Post cath, patient was transported to CCU for monitoring. He was started on aspirin, Brilinta, statin and beta blocker. Post cath his troponin increased to greater than 80. Lipid panel was obtained on the following day which showed cholesterol 158, LDL 100, HDL 29, triglyceride 145. Hemoglobin A1c was 6.1. Echocardiogram was obtained on 3/15 which showed EF 45-80%, grade 2 diastolic dysfunction, elevated left and right heart filling pressure, mild MR, peak PA pressure 39. His statin was decreased on 3/15 as his liver function tests showed AST 383 and ALT 76. The LFT was repeated on the 3/16 which showed AST has improved to 121 and ALT 48.  He was seen in the morning of 11/01/2014, at which time he denies any significant chest discomfort or shortness of breath. We have added lisinopril to his medical regimen which he should take at nighttime to avoid significant drop in his blood pressure. Metoprolol was decreased to 12.5 mg twice a day to allow more room for lisinopril. He has been instructed to be compliant  with aspirin and Brilinta. He still have 80% focal proximal AV groove stenosis with 60% mid left circumflex stenosis, per Dr. Gwenlyn Found, we will continue medical therapy for now, however if he has anginal symptoms in the future or future stress test shows any sign of ischemia, we would consider PCI of this lesion later. Otherwise he is deemed stable for discharge from cardiology perspective. He has been seen by cardiac rehabilitation. I will arrange follow-up in the [facility] in one week as [facility]'s schedule is full for 1 month. I have also arranged followup with Dr. Gwenlyn Found in 4-6 wks.   Discharge Vitals Blood pressure 99/76, pulse 63, temperature 98.8 F (37.1 C), temperature source Oral, resp. rate 14, height 5\' 8"  (1.727 m), weight 173 lb 4.5 oz (78.6 kg), SpO2 98 %.  Filed Weights   10/30/14 1100 10/30/14 1505  Weight: 178 lb 9.2 oz (81 kg) 173 lb 4.5 oz (78.6 kg)    Labs  CBC  Recent Labs  10/30/14 1830 10/31/14 0313  WBC 15.1* 17.5*  HGB 14.7 14.0  HCT 43.7 41.8  MCV 87.8 87.6  PLT 220 998   Basic Metabolic Panel  Recent Labs  10/31/14 0313 11/01/14 1055  NA 137 135  K 4.0 3.9  CL 104 102  CO2 23 26  GLUCOSE 149* 125*  BUN 16 21  CREATININE 0.89 1.14  CALCIUM 8.7 8.9   Liver Function Tests  Recent Labs  10/31/14 0313 11/01/14 1055  AST 383* 121*  ALT 76* 48  ALKPHOS 48 47  BILITOT 1.3* 2.0*  PROT 5.6* 6.5  ALBUMIN 3.3* 3.4*   Cardiac Enzymes  Recent Labs  10/30/14 1540 10/30/14  1830 10/31/14 0055  TROPONINI >80.00* >80.00* >80.00*   Hemoglobin A1C  Recent Labs  10/31/14 0313  HGBA1C 6.1*   Fasting Lipid Panel  Recent Labs  10/31/14 0313  CHOL 158  HDL 29*  LDLCALC 100*  TRIG 145  CHOLHDL 5.4   Disposition  Pt is being discharged home today in good condition.  Follow-up Plans & Appointments      Follow-up Information    Follow up with Lorretta Harp, MD On 12/06/2014.   Specialty:  Cardiology   Why:  9:45am     Contact information:   [address] [place] [address] [phone]       Follow up with Truitt Merle, NP On 11/08/2014.   Specialty:  Nurse Practitioner   Why:  11:15am. Transition of Care followup. Note different office location as [facility]'s schedule is full for 1 month   Contact information:   [place] [place] [address] [phone]       Follow up with Precious Reel, MD.   Specialty:  Internal Medicine   Why:  Please followup with your primary care provider within 1-2 weeks   Contact information:   [address] [place] [address] [phone]       Discharge Medications    Medication List    STOP taking these medications        ibuprofen 200 MG tablet  Commonly known as:  ADVIL,MOTRIN      TAKE these medications        aspirin 81 MG chewable tablet  Chew 1 tablet (81 mg total) by mouth daily.     atorvastatin 10 MG tablet  Commonly known as:  LIPITOR  Take 1 tablet (10 mg total) by mouth daily at 6 PM.     lisinopril 2.5 MG tablet  Commonly known as:  PRINIVIL,ZESTRIL  Take 1 tablet (2.5 mg total) by mouth daily. Taken at night  Start taking on:  11/02/2014     metoprolol tartrate 25 MG tablet  Commonly known as:  LOPRESSOR  Take 0.5 tablets (12.5 mg total) by mouth 2 (two) times daily.     nitroGLYCERIN 0.4 MG SL tablet  Commonly known as:  NITROSTAT  Place 1 tablet (0.4 mg total) under the tongue every 5 (five) minutes as needed for chest pain.     ticagrelor 90 MG Tabs tablet  Commonly known as:  BRILINTA  Take 1 tablet (90 mg total) by mouth 2 (two) times daily.     vitamin C 500 MG tablet  Commonly known as:  ASCORBIC ACID  Take 500 mg by mouth daily.        Duration of Discharge Encounter   Greater than 30 minutes including physician time.  Hilbert Corrigan PA-C Pager: [phone] 11/01/2014, 2:38 PM

## 2014-11-01 NOTE — Progress Notes (Signed)
CARDIAC REHAB PHASE I   PRE:  Rate/Rhythm: 22 SR with PVC's  BP:  Supine:   Sitting: 95/55  Standing:    SaO2: 93 RA  MODE:  Ambulation: 500 ft   POST:  Rate/Rhythm: 74 SR with PVC's  BP:  Supine:   Sitting: 111/47  Standing:    SaO2: 99 RA 1025-1055  Assisted X 1 to ambulate, just held to pt's hand.He was able to walk 500 feet without c/o of cp. Pt did c/o of feeling tired by end of walk. VS stable Pt to recliner after walk with call light in reach.  Rodney Langton RN 11/01/2014 10:53 AM

## 2014-11-01 NOTE — Progress Notes (Addendum)
Patient Name: Pedro Pope Date of Encounter: 11/01/2014  Active Problems:   Chest pain   ST elevation myocardial infarction (STEMI) involving other coronary artery of anterior wall - STEMI s/p DES to proximal LAD; residual 80 % proximal and 60% mid LCx diease   Length of Stay: 2  SUBJECTIVE  The patient has 5/10 dull chest pain on deep inspiration. He hasn't been walking since yesterday..   CURRENT MEDS . aspirin  81 mg Oral Daily  . atorvastatin  80 mg Oral q1800  . metoprolol tartrate  25 mg Oral BID  . ticagrelor  90 mg Oral BID   OBJECTIVE  Filed Vitals:   11/01/14 0300 11/01/14 0400 11/01/14 0600 11/01/14 0904  BP: 94/47 113/53 107/46 112/51  Pulse: 70 68 67   Temp: 99.5 F (37.5 C)     TempSrc: Oral     Resp: 14     Height:      Weight:      SpO2: 93% 90% 90%     Intake/Output Summary (Last 24 hours) at 11/01/14 0920 Last data filed at 10/31/14 2200  Gross per 24 hour  Intake    340 ml  Output    200 ml  Net    140 ml   Filed Weights   10/30/14 1100 10/30/14 1505  Weight: 178 lb 9.2 oz (81 kg) 173 lb 4.5 oz (78.6 kg)   PHYSICAL EXAM  General: Pleasant, NAD. Neuro: Alert and oriented X 3. Moves all extremities spontaneously. Psych: Normal affect. HEENT:  Normal  Neck: Supple without bruits or JVD. Lungs:  Resp regular and unlabored, CTA. Heart: RRR no s3, s4, or murmurs. Abdomen: Soft, non-tender, non-distended, BS + x 4.  Extremities: No clubbing, cyanosis or edema. DP/PT/Radials 2+ and equal bilaterally.  Accessory Clinical Findings  CBC  Recent Labs  10/30/14 1830 10/31/14 0313  WBC 15.1* 17.5*  HGB 14.7 14.0  HCT 43.7 41.8  MCV 87.8 87.6  PLT 220 389   Basic Metabolic Panel  Recent Labs  10/30/14 1830 10/31/14 0313  NA 136 137  K 4.2 4.0  CL 101 104  CO2 26 23  GLUCOSE 146* 149*  BUN 16 16  CREATININE 1.00 0.89  CALCIUM 8.6 8.7   Liver Function Tests  Recent Labs  10/31/14 0313  AST 383*  ALT 76*  ALKPHOS 48   BILITOT 1.3*  PROT 5.6*  ALBUMIN 3.3*   Cardiac Enzymes  Recent Labs  10/30/14 1540 10/30/14 1830 10/31/14 0055  TROPONINI >80.00* >80.00* >80.00*   Fasting Lipid Panel  Recent Labs  10/31/14 0313  CHOL 158  HDL 29*  LDLCALC 100*  TRIG 145  CHOLHDL 5.4   TELE: SR, PVCs  Left cardiac cath: 10/30/2014 ANGIOGRAPHIC RESULTS:   1. Left main; normal  2. LAD; occluded proximally. This was the infarct related vessel 3. Left circumflex; dominant with an 80% fairly focal proximal AV groove stenosis and 60% mid.  4. Right coronary artery; nondominant with a 90% mid stenosis 5. Left ventriculography; was not performed  Echo: 10/31/2014 Left ventricle: The cavity size was normal. Wall thickness was increased in a pattern of mild LVH. Systolic function was mildly to moderately reduced. The estimated ejection fraction was in the range of 40% to 45%. Doppler parameters are consistent with pseudonormal left ventricular relaxation (grade 2 diastolic dysfunction). The E/A ratio is 1.8, the E/e&' ratio is >15, suggesting elevated LV filling pressure. - Aortic valve: Trileaflet. Sclerosis without stenosis. There was no  regurgitation. - Aorta: The aorta was mildly calcified. - Mitral valve: Calcified annulus and anterior leaflet. Mild regurgitation. - Left atrium: Mildly dilated at 35 ml/m2. - Right atrium: The atrium was at the upper limits of normal in size. - Tricuspid valve: There was trivial regurgitation. - Pulmonary arteries: PA peak pressure: 39 mm Hg (S). - Inferior vena cava: The vessel was normal in size. The respirophasic diameter changes were in the normal range (>= 50%), consistent with normal central venous pressure.  Impressions: - LVEF 40-45%, anterior, anteroseptal and apical hypokinesis, grade 2 diastolic dysfunction with elevated left and right heart filling pressures, mild LAD, mild MR, trivial TR, RVSP 39  mmHg.     ASSESSMENT AND PLAN  74 year old male without prior cardiac history, on no prior meds, who developed chest pain at 10:30 am yesterday.  1. CAD, s/p STEMI with occluded prox LAD, successful direct intervention of an occluded proximal LAD with drug-eluting stent,  Angiomax and double antiplatelet therapy using Brilenta. An LV gram was not performed because the elevated LVEDP.  Echo is still pending. Troponin > 80. - continue ASA 81 mg po daily, ticagrelor 90 mg po BID, atorvastatin 80 mg po QHS, decrease metoprolol to 12.5 mg po BID. - start lisinopril 2.5 mg po daily today to be taken at night as his BP is low.  - the patient has residual  80% fairly focal proximal AV groove stenosis and 60% mid stenosis in dominant left circumflex artery, per Dr Gwenlyn Found, cath if symptoms persists or schedule an outpatient stress test to evaluate for ischemia. Otherwise medical therapy.  - we will order physical therapy evaluation, he needs to walk and see if he has any symptoms. - he will need to be referred to an outpatient cardiac rehab  2. Ischemic cardiomyopathy - LVEF 40-45% with anterior, anteroseptal and apical hypokinesis, grade 2 diastolic dysfunction with elevated left and right heart filling pressures, mild LAD, mild MR, trivial TR, RVSP 39 mmHg. We will start lisinopril 2.5 mg po daily. Echo will be repeated in 6 weeks.  3. Elevated LFTs, we will decrease atorvastatin to 10 mg po daily, we will repeat today, if downtrensing, discharge home and follow in 1 week.   4. Hyperlipidemia - on atorvastatin, as above   Awaiting BMP and LFTs, if downtrending, discharge home today. Follow up in the clinic within 1 week.   Signed, Dorothy Spark MD, [facility] 11/01/2014

## 2014-11-03 ENCOUNTER — Telehealth: Payer: Self-pay | Admitting: Cardiovascular Disease

## 2014-11-03 ENCOUNTER — Inpatient Hospital Stay (HOSPITAL_COMMUNITY): Payer: Medicare Other

## 2014-11-03 ENCOUNTER — Other Ambulatory Visit (HOSPITAL_COMMUNITY): Payer: Self-pay

## 2014-11-03 ENCOUNTER — Inpatient Hospital Stay (HOSPITAL_COMMUNITY)
Admission: EM | Admit: 2014-11-03 | Discharge: 2014-11-06 | DRG: 315 | Disposition: A | Discharge status: Home / Self Care | Payer: Medicare Other | Attending: Cardiovascular Disease | Admitting: Cardiovascular Disease

## 2014-11-03 DIAGNOSIS — Z79899 Other long term (current) drug therapy: Secondary | ICD-10-CM

## 2014-11-03 DIAGNOSIS — R002 Palpitations: Secondary | ICD-10-CM | POA: Diagnosis not present

## 2014-11-03 DIAGNOSIS — N529 Male erectile dysfunction, unspecified: Secondary | ICD-10-CM | POA: Diagnosis present

## 2014-11-03 DIAGNOSIS — I252 Old myocardial infarction: Secondary | ICD-10-CM

## 2014-11-03 DIAGNOSIS — R29898 Other symptoms and signs involving the musculoskeletal system: Secondary | ICD-10-CM | POA: Diagnosis not present

## 2014-11-03 DIAGNOSIS — I241 Dressler's syndrome: Secondary | ICD-10-CM | POA: Diagnosis present

## 2014-11-03 DIAGNOSIS — I4892 Unspecified atrial flutter: Secondary | ICD-10-CM | POA: Diagnosis present

## 2014-11-03 DIAGNOSIS — R778 Other specified abnormalities of plasma proteins: Secondary | ICD-10-CM

## 2014-11-03 DIAGNOSIS — R7989 Other specified abnormal findings of blood chemistry: Secondary | ICD-10-CM

## 2014-11-03 DIAGNOSIS — I2510 Atherosclerotic heart disease of native coronary artery without angina pectoris: Secondary | ICD-10-CM | POA: Diagnosis present

## 2014-11-03 DIAGNOSIS — I480 Paroxysmal atrial fibrillation: Secondary | ICD-10-CM | POA: Diagnosis present

## 2014-11-03 DIAGNOSIS — Z9861 Coronary angioplasty status: Secondary | ICD-10-CM

## 2014-11-03 DIAGNOSIS — I493 Ventricular premature depolarization: Secondary | ICD-10-CM | POA: Diagnosis present

## 2014-11-03 DIAGNOSIS — I4891 Unspecified atrial fibrillation: Secondary | ICD-10-CM

## 2014-11-03 DIAGNOSIS — K760 Fatty (change of) liver, not elsewhere classified: Secondary | ICD-10-CM | POA: Diagnosis present

## 2014-11-03 DIAGNOSIS — R7309 Other abnormal glucose: Secondary | ICD-10-CM | POA: Diagnosis present

## 2014-11-03 DIAGNOSIS — R202 Paresthesia of skin: Secondary | ICD-10-CM | POA: Diagnosis not present

## 2014-11-03 DIAGNOSIS — Z955 Presence of coronary angioplasty implant and graft: Secondary | ICD-10-CM

## 2014-11-03 DIAGNOSIS — M109 Gout, unspecified: Secondary | ICD-10-CM | POA: Diagnosis present

## 2014-11-03 DIAGNOSIS — Z87891 Personal history of nicotine dependence: Secondary | ICD-10-CM | POA: Diagnosis not present

## 2014-11-03 DIAGNOSIS — Z7982 Long term (current) use of aspirin: Secondary | ICD-10-CM | POA: Diagnosis not present

## 2014-11-03 DIAGNOSIS — I255 Ischemic cardiomyopathy: Secondary | ICD-10-CM | POA: Diagnosis not present

## 2014-11-03 DIAGNOSIS — R918 Other nonspecific abnormal finding of lung field: Secondary | ICD-10-CM | POA: Diagnosis not present

## 2014-11-03 DIAGNOSIS — E785 Hyperlipidemia, unspecified: Secondary | ICD-10-CM | POA: Diagnosis not present

## 2014-11-03 DIAGNOSIS — R748 Abnormal levels of other serum enzymes: Secondary | ICD-10-CM | POA: Diagnosis present

## 2014-11-03 DIAGNOSIS — Z8546 Personal history of malignant neoplasm of prostate: Secondary | ICD-10-CM | POA: Diagnosis not present

## 2014-11-03 DIAGNOSIS — R197 Diarrhea, unspecified: Secondary | ICD-10-CM

## 2014-11-03 DIAGNOSIS — R200 Anesthesia of skin: Secondary | ICD-10-CM | POA: Diagnosis not present

## 2014-11-03 DIAGNOSIS — I10 Essential (primary) hypertension: Secondary | ICD-10-CM | POA: Diagnosis present

## 2014-11-03 DIAGNOSIS — R7303 Prediabetes: Secondary | ICD-10-CM | POA: Diagnosis present

## 2014-11-03 DIAGNOSIS — I6789 Other cerebrovascular disease: Secondary | ICD-10-CM | POA: Diagnosis not present

## 2014-11-03 DIAGNOSIS — E876 Hypokalemia: Secondary | ICD-10-CM

## 2014-11-03 HISTORY — DX: Dressler's syndrome: I24.1

## 2014-11-03 HISTORY — DX: Prediabetes: R73.03

## 2014-11-03 HISTORY — DX: Other specified abnormal findings of blood chemistry: R79.89

## 2014-11-03 HISTORY — DX: Paroxysmal atrial fibrillation: I48.0

## 2014-11-03 HISTORY — DX: Ischemic cardiomyopathy: I25.5

## 2014-11-03 LAB — BASIC METABOLIC PANEL
Anion gap: 12 (ref 5–15)
BUN: 30 mg/dL — AB (ref 6–23)
CHLORIDE: 101 mmol/L (ref 96–112)
CO2: 20 mmol/L (ref 19–32)
Calcium: 8.6 mg/dL (ref 8.4–10.5)
Creatinine, Ser: 1.15 mg/dL (ref 0.50–1.35)
GFR calc Af Amer: 71 mL/min — ABNORMAL LOW (ref >90)
GFR calc non Af Amer: 61 mL/min — ABNORMAL LOW (ref >90)
GLUCOSE: 129 mg/dL — AB (ref 70–99)
POTASSIUM: 3 mmol/L — AB (ref 3.5–5.1)
Sodium: 133 mmol/L — ABNORMAL LOW (ref 135–145)

## 2014-11-03 LAB — CBC
HCT: 40.1 % (ref 39.0–52.0)
Hemoglobin: 13.9 g/dL (ref 13.0–17.0)
MCH: 29.7 pg (ref 26.0–34.0)
MCHC: 34.7 g/dL (ref 30.0–36.0)
MCV: 85.7 fL (ref 78.0–100.0)
Platelets: 232 10*3/uL (ref 150–400)
RBC: 4.68 MIL/uL (ref 4.22–5.81)
RDW: 14.4 % (ref 11.5–15.5)
WBC: 11.4 10*3/uL — ABNORMAL HIGH (ref 4.0–10.5)

## 2014-11-03 LAB — I-STAT TROPONIN, ED: Troponin i, poc: 6.11 ng/mL (ref 0.00–0.08)

## 2014-11-03 LAB — APTT: aPTT: 33 seconds (ref 24–37)

## 2014-11-03 LAB — DIFFERENTIAL
BASOS ABS: 0 10*3/uL (ref 0.0–0.1)
Basophils Relative: 0 % (ref 0–1)
Eosinophils Absolute: 0.2 10*3/uL (ref 0.0–0.7)
Eosinophils Relative: 2 % (ref 0–5)
Lymphocytes Relative: 11 % — ABNORMAL LOW (ref 12–46)
Lymphs Abs: 1.3 10*3/uL (ref 0.7–4.0)
Monocytes Absolute: 1 10*3/uL (ref 0.1–1.0)
Monocytes Relative: 9 % (ref 3–12)
NEUTROS PCT: 78 % — AB (ref 43–77)
Neutro Abs: 8.9 10*3/uL — ABNORMAL HIGH (ref 1.7–7.7)

## 2014-11-03 LAB — I-STAT CHEM 8, ED
BUN: 31 mg/dL — AB (ref 6–23)
CALCIUM ION: 1.15 mmol/L (ref 1.13–1.30)
CREATININE: 1.1 mg/dL (ref 0.50–1.35)
Chloride: 100 mmol/L (ref 96–112)
Glucose, Bld: 132 mg/dL — ABNORMAL HIGH (ref 70–99)
HCT: 44 % (ref 39.0–52.0)
Hemoglobin: 15 g/dL (ref 13.0–17.0)
POTASSIUM: 3.1 mmol/L — AB (ref 3.5–5.1)
Sodium: 136 mmol/L (ref 135–145)
TCO2: 19 mmol/L (ref 0–100)

## 2014-11-03 LAB — PROTIME-INR
INR: 1.2 (ref 0.00–1.49)
Prothrombin Time: 15.4 seconds — ABNORMAL HIGH (ref 11.6–15.2)

## 2014-11-03 LAB — MAGNESIUM: MAGNESIUM: 2.1 mg/dL (ref 1.5–2.5)

## 2014-11-03 MED ORDER — HEPARIN (PORCINE) IN NACL 100-0.45 UNIT/ML-% IJ SOLN
1700.0000 [IU]/h | INTRAMUSCULAR | Status: DC
  Administered 2014-11-03: 1200 [IU]/h via INTRAVENOUS
  Administered 2014-11-06: 1500 [IU]/h via INTRAVENOUS
  Filled 2014-11-03 (×4): qty 250

## 2014-11-03 MED ORDER — HEPARIN SODIUM (PORCINE) 5000 UNIT/ML IJ SOLN
INTRAMUSCULAR | Status: AC
  Filled 2014-11-03: qty 1

## 2014-11-03 MED ORDER — HEPARIN SODIUM (PORCINE) 5000 UNIT/ML IJ SOLN: 4000.0000 [IU] | Freq: Once | INTRAMUSCULAR | Status: DC

## 2014-11-03 MED ORDER — ONDANSETRON HCL 4 MG/2ML IJ SOLN: 4.0000 mg | Freq: Four times a day (QID) | INTRAMUSCULAR | Status: DC | PRN

## 2014-11-03 MED ORDER — LISINOPRIL 2.5 MG PO TABS
2.5000 mg | ORAL_TABLET | Freq: Every day | ORAL | Status: DC
  Administered 2014-11-04 – 2014-11-06 (×3): 2.5 mg via ORAL
  Filled 2014-11-03 (×3): qty 1

## 2014-11-03 MED ORDER — METOPROLOL TARTRATE 12.5 MG HALF TABLET
12.5000 mg | ORAL_TABLET | Freq: Two times a day (BID) | ORAL | Status: DC
  Administered 2014-11-04 – 2014-11-06 (×5): 12.5 mg via ORAL
  Filled 2014-11-03 (×6): qty 1

## 2014-11-03 MED ORDER — COLCHICINE 0.6 MG PO TABS
0.6000 mg | ORAL_TABLET | Freq: Two times a day (BID) | ORAL | Status: DC
  Administered 2014-11-04 – 2014-11-05 (×4): 0.6 mg via ORAL
  Filled 2014-11-03 (×4): qty 1

## 2014-11-03 MED ORDER — NITROGLYCERIN 0.4 MG SL SUBL: 0.4000 mg | SUBLINGUAL_TABLET | SUBLINGUAL | Status: DC | PRN

## 2014-11-03 MED ORDER — VITAMIN C 500 MG PO TABS
500.0000 mg | ORAL_TABLET | Freq: Every day | ORAL | Status: DC
  Administered 2014-11-04 – 2014-11-06 (×3): 500 mg via ORAL
  Filled 2014-11-03 (×3): qty 1

## 2014-11-03 MED ORDER — POTASSIUM CHLORIDE CRYS ER 20 MEQ PO TBCR
40.0000 meq | EXTENDED_RELEASE_TABLET | Freq: Once | ORAL | Status: AC
  Administered 2014-11-03: 40 meq via ORAL
  Filled 2014-11-03: qty 2

## 2014-11-03 MED ORDER — ACETAMINOPHEN 325 MG PO TABS: 650.0000 mg | ORAL_TABLET | ORAL | Status: DC | PRN

## 2014-11-03 MED ORDER — POTASSIUM CHLORIDE 10 MEQ/100ML IV SOLN
10.0000 meq | Freq: Once | INTRAVENOUS | Status: AC
  Administered 2014-11-03: 10 meq via INTRAVENOUS
  Filled 2014-11-03: qty 100

## 2014-11-03 MED ORDER — TICAGRELOR 90 MG PO TABS
90.0000 mg | ORAL_TABLET | Freq: Two times a day (BID) | ORAL | Status: DC
  Administered 2014-11-04 – 2014-11-06 (×5): 90 mg via ORAL
  Filled 2014-11-03 (×6): qty 1

## 2014-11-03 MED ORDER — ATORVASTATIN CALCIUM 10 MG PO TABS
10.0000 mg | ORAL_TABLET | Freq: Every day | ORAL | Status: DC
  Administered 2014-11-04 – 2014-11-05 (×2): 10 mg via ORAL
  Filled 2014-11-03 (×2): qty 1

## 2014-11-03 MED ORDER — HEPARIN (PORCINE) IN NACL 100-0.45 UNIT/ML-% IJ SOLN: 10.0000 [IU]/kg/h | Freq: Once | INTRAMUSCULAR | Status: DC

## 2014-11-03 MED ORDER — ASPIRIN 81 MG PO CHEW
81.0000 mg | CHEWABLE_TABLET | Freq: Every day | ORAL | Status: DC
  Administered 2014-11-04 – 2014-11-06 (×3): 81 mg via ORAL
  Filled 2014-11-03 (×3): qty 1

## 2014-11-03 MED ORDER — DEXTROSE 5 % IV SOLN
5.0000 mg/h | INTRAVENOUS | Status: DC
  Administered 2014-11-03 – 2014-11-04 (×3): 10 mg/h via INTRAVENOUS
  Administered 2014-11-05: 5 mg/h via INTRAVENOUS

## 2014-11-03 MED ORDER — DILTIAZEM HCL 100 MG IV SOLR: 5.0000 mg/h | INTRAVENOUS | Status: DC

## 2014-11-03 MED ORDER — HEPARIN BOLUS VIA INFUSION
4000.0000 [IU] | Freq: Once | INTRAVENOUS | Status: AC
  Administered 2014-11-03: 4000 [IU] via INTRAVENOUS
  Filled 2014-11-03: qty 4000

## 2014-11-03 NOTE — ED Notes (Signed)
Dr. Gwenlyn Found, cardiologist at bedside.

## 2014-11-03 NOTE — ED Notes (Addendum)
Dr Ashok Cordia given a copy of troponin results 6.11

## 2014-11-03 NOTE — Telephone Encounter (Signed)
No further instructions. Follow-up with PCP, Dr. [name], for noncardiac issues

## 2014-11-03 NOTE — Telephone Encounter (Signed)
Pt's wife called in stating that for the past 2 days he has been having some really bad diarhrea and she would like to know if his medications may need to be adjusted. Please call her back  Thanks

## 2014-11-03 NOTE — Telephone Encounter (Signed)
Spoke to wife states patient had MI -PCI stent on Monday . He was discharge on Wednesday. She states he has had diarrhea before leaving hospital.4-5 times yesterday and 3-4 times last night. She states it is watery, no blood noted.  She states he is not able to eat but is able to drink fluids.She wanted to know if this normal form a heart standpoint. RN informed wife - that diarrhea is not normal - patient may have picked up stomach virus. RN informed wife to continue fluids and attempt soft food intake. If diarrhea continue this morning -Contact primary,or follow up at an [facility].  Wife also wanted to know what website she could go to for heart healthy diet- RN recommend [facility] may check [employer] website. Will defer to Dr Gwenlyn Found for any further istructions

## 2014-11-03 NOTE — H&P (Signed)
HPI: [name] is a 6M with recent anterolateral STEMI (s/p prox LAD DES 11/01/14), ischemic cardiomyopathy EF 40-45%, and HL here with L arm tingling and new onset AF.  [name] was feeling well until after dinner this evening when he developed L arm tingling.  This was similar to how he felt during his STEMI earlier this week, so he decided to call EMS.  He denies any CP/pressure, SOB, diaphoresis, N/V or palpitations.  He called EMS and upon their arrival he was noted to be in afib with rates in the 110s.  There were also inferior and lateral ST elevations, so a code STEMI was called.  Upon arrival in the ED [name] continued to be in atrial fibrillation with RVR.  He had 6mm STE in lead III and 2-86mm ST elevation in the lateral leads.  He was evaluated by Dr. Gwenlyn Pope in the ED and it was felt that this EKG was still an evolution of his event earlier this week.  He spontaneously converted to NSR and no longer felt the L arm tingling.  Given that his symptoms resolved with sinus rhythm and his evolving EKG he was not taken to the cath lab.  He was started on a diltiazem and heparin infusions.  He continued to switch back and forth between afib and sinus rhythm while in the ED.  He noted an odd feeling in his chest each time he went back into afib.  Of note, [name] reports multiple loose stools since being discharged from the hospital.  He reports up to 4 loose stools daily.  He denies fever, chills, abdominal pain, N/V, melena or hematochezia.  He also reports pleuritic CP under the L breast with deep inspiration.    Review of Systems:     Cardiac Review of Systems: {Y] = yes [ ]  = no  Chest Pain [    ]  Resting SOB [   ] Exertional SOB  [  ]  Orthopnea [  ]   Pedal Edema [   ]    Palpitations [  ] Syncope  [  ]   Presyncope [   ]  General Review of Systems: [Y] = yes [  ]=no Constitional: recent weight change [  ]; anorexia [  ]; fatigue [  ]; nausea [  ]; night sweats [  ]; fever [   ]; or chills [  ];                                                                                                                                          Dental: poor dentition[  ];  Eye : blurred vision [  ]; diplopia [   ]; vision changes [  ];  Amaurosis fugax[  ]; Resp: cough [  ];  wheezing[  ];  hemoptysis[  ]; shortness of breath[  ];  paroxysmal nocturnal dyspnea[  ]; dyspnea on exertion[  ]; or orthopnea[  ];  GI:  gallstones[  ], vomiting[  ];  dysphagia[  ]; melena[  ];  hematochezia [  ]; heartburn[  ];   Hx of  Colonoscopy[  ]; diarrhea GU: kidney stones [  ]; hematuria[  ];   dysuria [  ];  nocturia[  ];  history of     obstruction [  ];                 Skin: rash, swelling[  ];, hair loss[  ];  peripheral edema[  ];  or itching[  ]; Musculosketetal: myalgias[  ];  joint swelling[  ];  joint erythema[  ];  joint pain[  ];  back pain[  ];  Heme/Lymph: bruising[  ];  bleeding[  ];  anemia[  ];  Neuro: TIA[  ];  headaches[  ];  stroke[  ];  vertigo[  ];  seizures[  ];   paresthesias[  ];  difficulty walking[  ];  Psych:depression[  ]; anxiety[  ];  Endocrine: diabetes[  ];  thyroid dysfunction[  ];  Immunizations: Flu [  ]; Pneumococcal[  ];  Other:  Past Medical History  Diagnosis Date  . Chest pain   . Prostate cancer   . Gout   . Rheumatic fever 1947  . History of chest tube placement   . Chronic headaches   . Fatty liver   . Anosmia   . ED (erectile dysfunction)   . CAD (coronary artery disease), native coronary artery     cath 10/29/2013 anterolateral STEMI s/p DES to proximal LAD; residual 80 % proximal and 60% mid LCx diease  . ST elevation myocardial infarction (STEMI) of inferior wall 10/30/14    STEMI s/p DES to proximal LAD; residual 80 % proximal and 60% mid LCx diease     (Not in a hospital admission)   Allergies  Allergen Reactions  . Penicillins Anaphylaxis  . Corn Syrup [Glucose] Other (See Comments)    High fructose corn syrup causes gout  .  Aspirin Adult Low [Aspirin] Itching and Rash    Dr. Virgina Pope suspects that long term caused a rash, short term use appears to be ok    History   Social History  . Marital Status: Married    Spouse Name: N/A  . Number of Children: N/A  . Years of Education: N/A   Occupational History  . Not on file.   Social History Main Topics  . Smoking status: Former Smoker    Quit date: 08/18/1992  . Smokeless tobacco: Not on file  . Alcohol Use: Yes     Comment: social  . Drug Use: No  . Sexual Activity: Not on file   Other Topics Concern  . Not on file   Social History Narrative    Family History  Problem Relation Age of Onset  . Cancer    . Coronary artery disease Brother 53    s/p CABG  . Diabetes    . Hyperlipidemia    . Hypertension    . Gout      PHYSICAL EXAM: Filed Vitals:   11/03/14 2200  BP: 117/62  Pulse: 70  Temp:   Resp: 12   General:  Well appearing. No respiratory difficulty HEENT: normal Neck: supple. no JVD. Carotids 2+ bilat; no bruits. No lymphadenopathy or thryomegaly appreciated. Cor: PMI nondisplaced. Irregularly irregular. No rubs, gallops or murmurs. Lungs: clear to auscultation  bilaterally Abdomen: soft, nontender, nondistended. No hepatosplenomegaly. No bruits or masses. Good bowel sounds. Extremities: no cyanosis, clubbing, rash, edema Neuro: alert & oriented x 3, CN II-XII in tact.  Strength 5/5 throughout bilateral UE/LE proximally and distally.  Sensation in tact to LT.  No pronator drift.  Toes downgoing bilaterally.    Affect pleasant.  ECG: Atrial fibrillation at 111bpm.    Results for orders placed or performed during the hospital encounter of 11/03/14 (from the past 24 hour(s))  CBC     Status: Abnormal   Collection Time: 11/03/14  8:57 PM  Result Value Ref Range   WBC 11.4 (H) 4.0 - 10.5 K/uL   RBC 4.68 4.22 - 5.81 MIL/uL   Hemoglobin 13.9 13.0 - 17.0 g/dL   HCT 40.1 39.0 - 52.0 %   MCV 85.7 78.0 - 100.0 fL   MCH 29.7 26.0 -  34.0 pg   MCHC 34.7 30.0 - 36.0 g/dL   RDW 14.4 11.5 - 15.5 %   Platelets 232 150 - 400 K/uL  Differential     Status: Abnormal   Collection Time: 11/03/14  8:57 PM  Result Value Ref Range   Neutrophils Relative % 78 (H) 43 - 77 %   Neutro Abs 8.9 (H) 1.7 - 7.7 K/uL   Lymphocytes Relative 11 (L) 12 - 46 %   Lymphs Abs 1.3 0.7 - 4.0 K/uL   Monocytes Relative 9 3 - 12 %   Monocytes Absolute 1.0 0.1 - 1.0 K/uL   Eosinophils Relative 2 0 - 5 %   Eosinophils Absolute 0.2 0.0 - 0.7 K/uL   Basophils Relative 0 0 - 1 %   Basophils Absolute 0.0 0.0 - 0.1 K/uL  Protime-INR     Status: Abnormal   Collection Time: 11/03/14  8:57 PM  Result Value Ref Range   Prothrombin Time 15.4 (H) 11.6 - 15.2 seconds   INR 1.20 0.00 - 1.49  APTT     Status: None   Collection Time: 11/03/14  8:57 PM  Result Value Ref Range   aPTT 33 24 - 37 seconds  Basic metabolic panel     Status: Abnormal   Collection Time: 11/03/14  8:57 PM  Result Value Ref Range   Sodium 133 (L) 135 - 145 mmol/L   Potassium 3.0 (L) 3.5 - 5.1 mmol/L   Chloride 101 96 - 112 mmol/L   CO2 20 19 - 32 mmol/L   Glucose, Bld 129 (H) 70 - 99 mg/dL   BUN 30 (H) 6 - 23 mg/dL   Creatinine, Ser 1.15 0.50 - 1.35 mg/dL   Calcium 8.6 8.4 - 10.5 mg/dL   GFR calc non Af Amer 61 (L) >90 mL/min   GFR calc Af Amer 71 (L) >90 mL/min   Anion gap 12 5 - 15  I-stat troponin, ED (do not order at [facility])     Status: Abnormal   Collection Time: 11/03/14  9:06 PM  Result Value Ref Range   Troponin i, poc 6.11 (HH) 0.00 - 0.08 ng/mL   Comment NOTIFIED PHYSICIAN    Comment 3          I-Stat Chem 8, ED     Status: Abnormal   Collection Time: 11/03/14  9:08 PM  Result Value Ref Range   Sodium 136 135 - 145 mmol/L   Potassium 3.1 (L) 3.5 - 5.1 mmol/L   Chloride 100 96 - 112 mmol/L   BUN 31 (H) 6 - 23 mg/dL  Creatinine, Ser 1.10 0.50 - 1.35 mg/dL   Glucose, Bld 132 (H) 70 - 99 mg/dL   Calcium, Ion 1.15 1.13 - 1.30 mmol/L   TCO2 19 0 - 100 mmol/L    Hemoglobin 15.0 13.0 - 17.0 g/dL   HCT 44.0 39.0 - 52.0 %   No results Pope.   ASSESSMENT:  69M with recent anterolateral STEMI (s/p prox LAD DES 11/01/14), ischemic cardiomyopathy EF 40-45%, and HL here with L arm tingling and new onset AF.   PLAN/DISCUSSION:  # Atrial fibrillation: [name] continues to vacillate between afib and sinus rhythm.  He is tolerating the diltiazem infusion well.  Etiology may be due to ischemia, though troponin is downtrending and he does not have any stymptoms when in NSR, making this unlikely.  Potassium was 3.1 on admission.  Patient denies excessive EtOH use.  Given his report of pleuritic CP and ST elevations in leads that were not a part of his infarct-related artery, I'm concerned that he may have Dressler's syndrome.  Given that he is on heparin and ticagrelor, will not give high dose ASA. - Continue home metoprolol - Continue diltiazem infusion with plan to switch to oral vs increase metoprolol.  May favor increasing metoprolol given his depressed EF - Continue heparin infusion (CHA2DS2-VASc of 2) - Check thyroid function - Replete K - repeat echo to assess for effusion - Start colchicine 0.6mg  bid  # CAD s/p recent STEMI: No recurrent CP.   - Continue home ASA, beta blocker and statin - Continue ticagrelor - Consider ACE-I given reduced EF if BP will tolerate - Repeat troponin x1 to ensure it continues to trend down - Heart healthy diet  # Diarrhea: Seems to be slowing down.  Will check C. Diff given recent hospitalization.  Will likely get worse before getting better given colchicine.  # Code: Full

## 2014-11-03 NOTE — Progress Notes (Signed)
ANTICOAGULATION CONSULT NOTE - Initial Consult  Pharmacy Consult for Heparin Indication: atrial fibrillation  Allergies  Allergen Reactions  . Penicillins Anaphylaxis  . Corn Syrup [Glucose] Other (See Comments)    High fructose corn syrup causes gout  . Aspirin Adult Low [Aspirin] Itching and Rash    Dr. [name] suspects that long term caused a rash, short term use appears to be ok    Patient Measurements: Height: 5' 8.5" (174 cm)  Weight: 172 lb (78.019 kg) IBW/kg (Calculated) : 69.55 Heparin Dosing Weight: 78 kg  Vital Signs:    Labs:  Recent Labs  11/01/14 1055  CREATININE 1.14    Estimated Creatinine Clearance: 55.8 mL/min (by C-G formula based on Cr of 1.14).   Medical History: Past Medical History  Diagnosis Date  . Chest pain   . Prostate cancer   . Gout   . Rheumatic fever 1947  . History of chest tube placement   . Chronic headaches   . Fatty liver   . Anosmia   . ED (erectile dysfunction)   . CAD (coronary artery disease), native coronary artery     cath 10/29/2013 anterolateral STEMI s/p DES to proximal LAD; residual 80 % proximal and 60% mid LCx diease  . ST elevation myocardial infarction (STEMI) of inferior wall 10/30/14    STEMI s/p DES to proximal LAD; residual 80 % proximal and 60% mid LCx diease    Medications:   (Not in a hospital admission) Scheduled:  Infusions:   Assessment: 74yo male recently discharged for STEMI presents with arm numbness and is found to be in Afib w/ RVR. Pt reports no chest pain. Pharmacy is consulted to dose heparin for atrial fibrillation. Hgb 15, sCr 1.1, Plt pending.  Goal of Therapy:  Heparin level 0.3-0.7 units/ml Monitor platelets by anticoagulation protocol: Yes   Plan:  Give 4000 units bolus x 1 Start heparin infusion at 1200 units/hr Check anti-Xa level in 8 hours and daily while on heparin Continue to monitor H&H and platelets  Andrey Cota. Diona Foley, PharmD Clinical Pharmacist Pager  [phone] 11/03/2014,8:54 PM

## 2014-11-03 NOTE — Progress Notes (Signed)
Chaplain responded to code STEMI.  Offered emotional support, water to wife. Wife requested prayer, which was offered, with patient and family.  Patient and wife appear calm and express relief that the cath was cancelled although they are worried about why he is back in the hospital so soon after a heart attack and cath (Monday). Son appears somewhat anxious. Offered continued support as needed.    Luana Shu [phone]       11/03/14 2100  Clinical Encounter Type  Visited With Patient and family together  Visit Type Spiritual support;Initial  Spiritual Encounters  Spiritual Needs Emotional;Prayer  Stress Factors  Patient Stress Factors Health changes

## 2014-11-03 NOTE — ED Notes (Signed)
Pt taken to xray 

## 2014-11-03 NOTE — ED Notes (Addendum)
PER EMS: pt from home, stent placed on Monday. Pt states he called EMS tonight due to "irregular heart beat" and tingling and numbness in left arm, denies chest pain, started at 1930. A&OX4.  Pt takes brilinta and took 81mg  aspirin today. VS: BP-121/65, HR-89 afib, 97% RA.

## 2014-11-04 DIAGNOSIS — I255 Ischemic cardiomyopathy: Secondary | ICD-10-CM

## 2014-11-04 DIAGNOSIS — Z955 Presence of coronary angioplasty implant and graft: Secondary | ICD-10-CM

## 2014-11-04 DIAGNOSIS — I4892 Unspecified atrial flutter: Secondary | ICD-10-CM

## 2014-11-04 DIAGNOSIS — I4891 Unspecified atrial fibrillation: Secondary | ICD-10-CM

## 2014-11-04 DIAGNOSIS — R7989 Other specified abnormal findings of blood chemistry: Secondary | ICD-10-CM

## 2014-11-04 DIAGNOSIS — E785 Hyperlipidemia, unspecified: Secondary | ICD-10-CM

## 2014-11-04 LAB — BASIC METABOLIC PANEL
ANION GAP: 7 (ref 5–15)
BUN: 28 mg/dL — ABNORMAL HIGH (ref 6–23)
CO2: 21 mmol/L (ref 19–32)
Calcium: 8.5 mg/dL (ref 8.4–10.5)
Chloride: 104 mmol/L (ref 96–112)
Creatinine, Ser: 1.1 mg/dL (ref 0.50–1.35)
GFR calc Af Amer: 75 mL/min — ABNORMAL LOW (ref >90)
GFR calc non Af Amer: 65 mL/min — ABNORMAL LOW (ref >90)
Glucose, Bld: 104 mg/dL — ABNORMAL HIGH (ref 70–99)
POTASSIUM: 3.7 mmol/L (ref 3.5–5.1)
SODIUM: 132 mmol/L — AB (ref 135–145)

## 2014-11-04 LAB — HEPARIN LEVEL (UNFRACTIONATED)
HEPARIN UNFRACTIONATED: 0.55 [IU]/mL (ref 0.30–0.70)
Heparin Unfractionated: <0.1 IU/mL — ABNORMAL LOW (ref 0.30–0.70)

## 2014-11-04 LAB — CBC
HCT: 36.5 % — ABNORMAL LOW (ref 39.0–52.0)
Hemoglobin: 12.3 g/dL — ABNORMAL LOW (ref 13.0–17.0)
MCH: 29.2 pg (ref 26.0–34.0)
MCHC: 33.7 g/dL (ref 30.0–36.0)
MCV: 86.7 fL (ref 78.0–100.0)
Platelets: 232 10*3/uL (ref 150–400)
RBC: 4.21 MIL/uL — ABNORMAL LOW (ref 4.22–5.81)
RDW: 14.5 % (ref 11.5–15.5)
WBC: 11.1 10*3/uL — ABNORMAL HIGH (ref 4.0–10.5)

## 2014-11-04 LAB — TSH: TSH: 5.708 u[IU]/mL — ABNORMAL HIGH (ref 0.350–4.500)

## 2014-11-04 LAB — TROPONIN I: Troponin I: 8.73 ng/mL (ref <0.031)

## 2014-11-04 MED ORDER — HEPARIN BOLUS VIA INFUSION
2000.0000 [IU] | Freq: Once | INTRAVENOUS | Status: AC
  Administered 2014-11-04: 2000 [IU] via INTRAVENOUS
  Filled 2014-11-04: qty 2000

## 2014-11-04 NOTE — Progress Notes (Signed)
Pt has positive troponin 8.73. Pt resting in bed and denies any Cp. Md on call made aware. No new orders received. Will cont to monitor pt.

## 2014-11-04 NOTE — Progress Notes (Signed)
*  PRELIMINARY RESULTS* Echocardiogram 2D Echocardiogram has been performed.  Leavy Cella 11/04/2014, 1:27 PM

## 2014-11-04 NOTE — Progress Notes (Signed)
SUBJECTIVE:  Denies anginal chest pain.  He will have a very slight sharp pain when he takes a deep breath in. Left arm tingling has resolved  OBJECTIVE:   Vitals:   Filed Vitals:   11/03/14 2326 11/03/14 2345 11/04/14 0400 11/04/14 0926  BP: 96/62 103/76 110/53 111/57  Pulse:   68 70  Temp:   98.6 F (37 C)   TempSrc:   Oral   Resp: 18  18   Height:      Weight:   169 lb 6.4 oz (76.839 kg)   SpO2: 96%  96%    I&O's:   Intake/Output Summary (Last 24 hours) at 11/04/14 1418 Last data filed at 11/04/14 3295  Gross per 24 hour  Intake    240 ml  Output    300 ml  Net    -60 ml   TELEMETRY: Reviewed telemetry pt in NSR:     PHYSICAL EXAM General: Well developed, well nourished, in no acute distress Head: Eyes PERRLA, No xanthomas.   Normal cephalic and atramatic  Lungs:   Clear bilaterally to auscultation and percussion. Heart:   HRRR S1 S2 Pulses are 2+ & equal. Abdomen: Bowel sounds are positive, abdomen soft and non-tender without masses  Extremities:   No clubbing, cyanosis or edema.  DP +1 Neuro: Alert and oriented X 3. Psych:  Good affect, responds appropriately   LABS: Basic Metabolic Panel:  Recent Labs  11/03/14 2057 11/03/14 2108 11/03/14 2253 11/04/14 0415  NA 133* 136  --  132*  K 3.0* 3.1*  --  3.7  CL 101 100  --  104  CO2 20  --   --  21  GLUCOSE 129* 132*  --  104*  BUN 30* 31*  --  28*  CREATININE 1.15 1.10  --  1.10  CALCIUM 8.6  --   --  8.5  MG  --   --  2.1  --    Liver Function Tests: No results for input(s): AST, ALT, ALKPHOS, BILITOT, PROT, ALBUMIN in the last 72 hours. No results for input(s): LIPASE, AMYLASE in the last 72 hours. CBC:  Recent Labs  11/03/14 2057 11/03/14 2108 11/04/14 0415  WBC 11.4*  --  11.1*  NEUTROABS 8.9*  --   --   HGB 13.9 15.0 12.3*  HCT 40.1 44.0 36.5*  MCV 85.7  --  86.7  PLT 232  --  232   Cardiac Enzymes:  Recent Labs  11/04/14 0415  TROPONINI 8.73*   BNP: Invalid input(s):  POCBNP D-Dimer: No results for input(s): DDIMER in the last 72 hours. Hemoglobin A1C: No results for input(s): HGBA1C in the last 72 hours. Fasting Lipid Panel: No results for input(s): CHOL, HDL, LDLCALC, TRIG, CHOLHDL, LDLDIRECT in the last 72 hours. Thyroid Function Tests:  Recent Labs  11/03/14 2232  TSH 5.708*   Anemia Panel: No results for input(s): VITAMINB12, FOLATE, FERRITIN, TIBC, IRON, RETICCTPCT in the last 72 hours. Coag Panel:   Lab Results  Component Value Date   INR 1.20 11/03/2014    RADIOLOGY: X-ray Chest Pa And Lateral  11/03/2014   CLINICAL DATA:  Palpitations and left upper extremity paresthesias  EXAM: CHEST  2 VIEW  COMPARISON:  09/18/2010  FINDINGS: There is mild cardiomegaly. There is mild interstitial prominence. There is focal opacity in the posterior base which may represent a left lower lobe infiltrate. There are no effusions. Hilar and mediastinal contours are normal.  IMPRESSION: Small focal airspace opacity  in the posterior base, probably left lower lobe, more likely atelectatic but infectious infiltrate not excluded. Mild generalized interstitial prominence may be chronic.   Electronically Signed   By: Andreas Newport M.D.   On: 11/03/2014 23:41    ASSESSMENT: 21M with recent anterolateral STEMI (s/p prox LAD DES 11/01/14), ischemic cardiomyopathy EF 40-45%, and HL here with L arm tingling and new onset AF.   PLAN/DISCUSSION:  # Atrial fibrillation: Mr. Dipaola continues to go in and out of afib and sinus rhythm. He is tolerating the diltiazem infusion well. Etiology may be due to ischemia, though troponin is downtrending and he does not have any symptoms when in NSR, making this unlikely.Initially called an inferior STEMI in the ER but when reviewed with Dr. Gwenlyn Found he felt this was EKG changes as evolution of prior STEMI.  Code STEMI was cancelled.   Potassium was 3.1 on admission. Patient denies excessive EtOH use. Given his report of pleuritic  CP and ST elevations in leads that were not a part of his infarct-related artery, need to consider Dressler's syndrome. Given that he is on heparin and ticagrelor, will not give high dose ASA.  TSH slightly increased at 5.7 - Continue home metoprolol - Continue diltiazem infusion with plan to switch to oral vs increase metoprolol. May favor increasing metoprolol given his depressed EF.  Will await echo results. - Continue heparin infusion (CHA2DS2-VASc of 2) - continue colchicine 0.6mg  bid for now.  Avoid NSAIDs since he is on DAPT and IV Heparin gtt   # CAD s/p recent STEMI: No recurrent CP.  - Continue home ASA, beta blocker and statin - Continue ticagrelor - Consider ACE-I given reduced EF if BP will tolerate - Repeat troponin x1 to ensure it continues to trend down - Heart healthy diet  # Diarrhea: Seems to be slowing down and none since last night. Will check C. Diff given recent hospitalization. Will likely get worse before getting better given colchicine.    Sueanne Margarita, MD  11/04/2014  2:18 PM

## 2014-11-04 NOTE — Progress Notes (Signed)
Pt's EKG showed bigeminy, SR, and Atrial Fib. Pt resting in bed dening any CP orSOB. Md on call made aware. Will cont IV cardiziem and will start PO colchiceine.Will cont to monitor pt.

## 2014-11-04 NOTE — Progress Notes (Signed)
ANTICOAGULATION CONSULT NOTE - Follow Up Consult  Pharmacy Consult for Heparin Indication: atrial fibrillation  Allergies  Allergen Reactions  . Penicillins Anaphylaxis  . Corn Syrup [Glucose] Other (See Comments)    High fructose corn syrup causes gout  . Aspirin Adult Low [Aspirin] Itching and Rash    Dr. [name] suspects that long term caused a rash, short term use appears to be ok    Patient Measurements: Height: 5' 8.5" (174 cm) (Simultaneous filing. User may not have seen previous data.) Weight: 169 lb 6.4 oz (76.839 kg) IBW/kg (Calculated) : 69.55   Vital Signs: BP: 111/57 mmHg (03/19 0926) Pulse Rate: 70 (03/19 0926)  Labs:  Recent Labs  11/03/14 2057 11/03/14 2108 11/04/14 0415 11/04/14 1630  HGB 13.9 15.0 12.3*  --   HCT 40.1 44.0 36.5*  --   PLT 232  --  232  --   APTT 33  --   --   --   LABPROT 15.4*  --   --   --   INR 1.20  --   --   --   HEPARINUNFRC  --   --  <0.10* 0.55  CREATININE 1.15 1.10 1.10  --   TROPONINI  --   --  8.73*  --     Estimated Creatinine Clearance: 58.9 mL/min (by C-G formula based on Cr of 1.1).   Medications:  Heparin @ 1500 units/hr  Assessment: 73yom continues on heparin for afib. Heparin level is therapeutic after re-bolus and rate increase this morning.  Goal of Therapy:  Heparin level 0.3-0.7 units/ml Monitor platelets by anticoagulation protocol: Yes   Plan:  1) Continue heparin at 1500 units/hr 2) Follow up heparin level in AM  Deboraha Sprang 11/04/2014,7:05 PM

## 2014-11-04 NOTE — ED Provider Notes (Signed)
CSN: [id number]     Arrival date & time 11/03/14  2048 History   First MD Initiated Contact with Patient 11/03/14 2054     Chief Complaint  Patient presents with  . Code STEMI     (Consider location/radiation/quality/duration/timing/severity/associated sxs/prior Treatment) HPI   Pedro Pope is a 4M with recent anterolateral STEMI (s/p prox LAD DES 11/01/14), ischemic cardiomyopathy EF 40-45%, and HL  Prior to arrival he developed L arm tingling. This was similar to how he felt during his STEMI earlier this week, so he decided to call EMS. He denies any CP/pressure, SOB, diaphoresis, N/V or palpitations. He called EMS and upon their arrival he was noted to be in afib with rates in the 110s. There were also inferior and lateral ST elevations.  Called out as STEMI  Past Medical History  Diagnosis Date  . Chest pain   . Prostate cancer   . Gout   . Rheumatic fever 1947  . History of chest tube placement   . Chronic headaches   . Fatty liver   . Anosmia   . ED (erectile dysfunction)   . CAD (coronary artery disease), native coronary artery     cath 10/29/2013 anterolateral STEMI s/p DES to proximal LAD; residual 80 % proximal and 60% mid LCx diease  . ST elevation myocardial infarction (STEMI) of inferior wall 10/30/14    STEMI s/p DES to proximal LAD; residual 80 % proximal and 60% mid LCx diease   Past Surgical History  Procedure Laterality Date  . Prostatectomy    . Tonsillectomy    . Coronary angioplasty with stent placement  10/30/14    STEMI s/p DES to proximal LAD; residual 80 % proximal and 60% mid LCx diease  . Left heart catheterization with coronary angiogram N/A 10/30/2014    Procedure: LEFT HEART CATHETERIZATION WITH CORONARY ANGIOGRAM;  Surgeon: Lorretta Harp, MD;  Location: [facility] CATH LAB;  Service: Cardiovascular;  Laterality: N/A;  . Percutaneous coronary stent intervention (pci-s)  10/30/2014    Procedure: PERCUTANEOUS CORONARY STENT INTERVENTION (PCI-S);   Surgeon: Lorretta Harp, MD;  Location: [facility] CATH LAB;  Service: Cardiovascular;;   Family History  Problem Relation Age of Onset  . Cancer    . Coronary artery disease Brother 12    s/p CABG  . Diabetes    . Hyperlipidemia    . Hypertension    . Gout     History  Substance Use Topics  . Smoking status: Former Smoker    Quit date: 08/18/1992  . Smokeless tobacco: Not on file  . Alcohol Use: Yes     Comment: social    Review of Systems  Constitutional: Negative for fever and chills.  Eyes: Negative for redness.  Respiratory: Negative for cough and shortness of breath.   Cardiovascular: Negative for chest pain.  Gastrointestinal: Negative for nausea, vomiting, abdominal pain and diarrhea.  Genitourinary: Negative for dysuria.  Skin: Negative for rash.  Neurological: Negative for headaches.       Arm tingling   All other systems reviewed and are negative.     Allergies  Penicillins; Corn syrup; and Aspirin adult low  Home Medications   Prior to Admission medications   Medication Sig Start Date End Date Taking? Authorizing Provider  aspirin 81 MG chewable tablet Chew 81 mg by mouth daily.   Yes Historical Provider, MD  atorvastatin (LIPITOR) 10 MG tablet Take 10 mg by mouth daily.   Yes Historical Provider, MD  lisinopril (PRINIVIL,ZESTRIL)  2.5 MG tablet Take 2.5 mg by mouth daily.   Yes Historical Provider, MD  metoprolol tartrate (LOPRESSOR) 25 MG tablet Take 12.5 mg by mouth 2 (two) times daily.   Yes Historical Provider, MD  nitroGLYCERIN (NITROSTAT) 0.4 MG SL tablet Place 0.4 mg under the tongue every 5 (five) minutes as needed for chest pain.   Yes Historical Provider, MD  ticagrelor (BRILINTA) 90 MG TABS tablet Take 90 mg by mouth 2 (two) times daily.   Yes Historical Provider, MD  vitamin C (ASCORBIC ACID) 500 MG tablet Take 500 mg by mouth daily.   Yes Historical Provider, MD  Ascorbic Acid (VITAMIN C) 500 MG tablet Take 500 mg by mouth daily.      Historical  Provider, MD  aspirin 81 MG chewable tablet Chew 1 tablet (81 mg total) by mouth daily. 11/01/14   Almyra Deforest, PA  atorvastatin (LIPITOR) 10 MG tablet Take 1 tablet (10 mg total) by mouth daily at 6 PM. 11/01/14   Almyra Deforest, PA  lisinopril (PRINIVIL,ZESTRIL) 2.5 MG tablet Take 1 tablet (2.5 mg total) by mouth daily. Taken at night 11/02/14   Almyra Deforest, PA  metoprolol tartrate (LOPRESSOR) 25 MG tablet Take 0.5 tablets (12.5 mg total) by mouth 2 (two) times daily. 11/01/14   Almyra Deforest, PA  nitroGLYCERIN (NITROSTAT) 0.4 MG SL tablet Place 1 tablet (0.4 mg total) under the tongue every 5 (five) minutes as needed for chest pain. 11/01/14   Almyra Deforest, PA  ticagrelor (BRILINTA) 90 MG TABS tablet Take 1 tablet (90 mg total) by mouth 2 (two) times daily. 11/01/14   Almyra Deforest, PA   BP 111/57 mmHg  Pulse 70  Temp(Src) 98.6 F (37 C) (Oral)  Resp 18  Ht 5' 8.5" (1.74 m)  Wt 169 lb 6.4 oz (76.839 kg)  BMI 25.38 kg/m2  SpO2 96% Physical Exam  Constitutional: He is oriented to person, place, and time. No distress.  HENT:  Head: Normocephalic and atraumatic.  Eyes: EOM are normal. Pupils are equal, round, and reactive to light.  Neck: Normal range of motion. Neck supple.  Pulmonary/Chest: Effort normal. No respiratory distress.  Abdominal: Soft. There is no tenderness.  Musculoskeletal: Normal range of motion.  Neurological: He is alert and oriented to person, place, and time.  Skin: No rash noted. He is not diaphoretic.  Psychiatric: He has a normal mood and affect.  Vitals reviewed.   ED Course  Procedures (including critical care time) Labs Review Labs Reviewed  CBC - Abnormal; Notable for the following:    WBC 11.4 (*)    All other components within normal limits  DIFFERENTIAL - Abnormal; Notable for the following:    Neutrophils Relative % 78 (*)    Neutro Abs 8.9 (*)    Lymphocytes Relative 11 (*)    All other components within normal limits  PROTIME-INR - Abnormal; Notable for the following:     Prothrombin Time 15.4 (*)    All other components within normal limits  BASIC METABOLIC PANEL - Abnormal; Notable for the following:    Sodium 133 (*)    Potassium 3.0 (*)    Glucose, Bld 129 (*)    BUN 30 (*)    GFR calc non Af Amer 61 (*)    GFR calc Af Amer 71 (*)    All other components within normal limits  HEPARIN LEVEL (UNFRACTIONATED) - Abnormal; Notable for the following:    Heparin Unfractionated <0.10 (*)    All other  components within normal limits  CBC - Abnormal; Notable for the following:    WBC 11.1 (*)    RBC 4.21 (*)    Hemoglobin 12.3 (*)    HCT 36.5 (*)    All other components within normal limits  TSH - Abnormal; Notable for the following:    TSH 5.708 (*)    All other components within normal limits  TROPONIN I - Abnormal; Notable for the following:    Troponin I 8.73 (*)    All other components within normal limits  BASIC METABOLIC PANEL - Abnormal; Notable for the following:    Sodium 132 (*)    Glucose, Bld 104 (*)    BUN 28 (*)    GFR calc non Af Amer 65 (*)    GFR calc Af Amer 75 (*)    All other components within normal limits  I-STAT CHEM 8, ED - Abnormal; Notable for the following:    Potassium 3.1 (*)    BUN 31 (*)    Glucose, Bld 132 (*)    All other components within normal limits  I-STAT TROPOININ, ED - Abnormal; Notable for the following:    Troponin i, poc 6.11 (*)    All other components within normal limits  CLOSTRIDIUM DIFFICILE BY PCR  APTT  MAGNESIUM  HEPARIN LEVEL (UNFRACTIONATED)  HEPARIN LEVEL (UNFRACTIONATED)  CBC    Imaging Review X-ray Chest Pa And Lateral  11/03/2014   CLINICAL DATA:  Palpitations and left upper extremity paresthesias  EXAM: CHEST  2 VIEW  COMPARISON:  09/18/2010  FINDINGS: There is mild cardiomegaly. There is mild interstitial prominence. There is focal opacity in the posterior base which may represent a left lower lobe infiltrate. There are no effusions. Hilar and mediastinal contours are normal.   IMPRESSION: Small focal airspace opacity in the posterior base, probably left lower lobe, more likely atelectatic but infectious infiltrate not excluded. Mild generalized interstitial prominence may be chronic.   Electronically Signed   By: Andreas Newport M.D.   On: 11/03/2014 23:41     EKG Interpretation   Date/Time:  Friday November 03 2014 21:02:41 EDT Ventricular Rate:  123 PR Interval:    QRS Duration: 101 QT Interval:  338 QTC Calculation: 483 R Axis:   91 Text Interpretation:  Atrial fibrillation with rapid ventricular response  Premature ventricular complexes Non-specific ST-t changes Confirmed by  Ashok Cordia  MD, Lennette Bihari ([id number]) on 11/03/2014 9:13:50 PM      MDM   Final diagnoses:  Atrial fibrillation with rapid ventricular response  Hypokalemia  Elevated troponin    Pedro Pope is a 89M with recent anterolateral STEMI (s/p prox LAD DES 11/01/14), ischemic cardiomyopathy EF 40-45%, and HL  Prior to arrival he developed L arm tingling and some ST segment elevations in the inferior leads.  Called out as STEMI  On arrival, cards as bedside.  Cards has started hep gtt and will admit to their service.  Labs sig for trop of 6.  Patient admitted to cards w/o incident.    Jarome Matin, MD 11/04/14 [place], MD 11/05/14 [phone]

## 2014-11-04 NOTE — Progress Notes (Signed)
ANTICOAGULATION CONSULT NOTE - Follow Up Consult  Pharmacy Consult for Heparin  Indication: atrial fibrillation  Allergies  Allergen Reactions  . Penicillins Anaphylaxis  . Corn Syrup [Glucose] Other (See Comments)    High fructose corn syrup causes gout  . Aspirin Adult Low [Aspirin] Itching and Rash    Dr. [name] suspects that long term caused a rash, short term use appears to be ok    Patient Measurements: Height: 5' 8.5" (174 cm) (Simultaneous filing. User may not have seen previous data.) Weight: 169 lb 6.4 oz (76.839 kg) IBW/kg (Calculated) : 69.55  Vital Signs: Temp: 98.6 F (37 C) (03/19 0400) Temp Source: Oral (03/19 0400) BP: 110/53 mmHg (03/19 0400) Pulse Rate: 68 (03/19 0400)  Labs:  Recent Labs  11/01/14 1055  11/03/14 2057 11/03/14 2108 11/04/14 0415  HGB  --   < > 13.9 15.0 12.3*  HCT  --   --  40.1 44.0 36.5*  PLT  --   --  232  --  232  APTT  --   --  33  --   --   LABPROT  --   --  15.4*  --   --   INR  --   --  1.20  --   --   HEPARINUNFRC  --   --   --   --  <0.10*  CREATININE 1.14  --  1.15 1.10  --   < > = values in this interval not displayed.  Estimated Creatinine Clearance: 58.9 mL/min (by C-G formula based on Cr of 1.1).  Assessment: Sub-therapeutic heparin level, no issues per RN.  Goal of Therapy:  Heparin level 0.3-0.7 units/ml Monitor platelets by anticoagulation protocol: Yes   Plan:  -Heparin 2000 units BOLUS -Increase heparin drip 1500 units/hr -1400 H -Daily CBC/HL -Monitor for bleeding  Narda Bonds 11/04/2014,5:59 AM

## 2014-11-05 ENCOUNTER — Encounter (HOSPITAL_COMMUNITY): Payer: Self-pay | Admitting: *Deleted

## 2014-11-05 DIAGNOSIS — R197 Diarrhea, unspecified: Secondary | ICD-10-CM

## 2014-11-05 DIAGNOSIS — I2510 Atherosclerotic heart disease of native coronary artery without angina pectoris: Secondary | ICD-10-CM

## 2014-11-05 LAB — TROPONIN I
TROPONIN I: 5.33 ng/mL — AB (ref <0.031)
TROPONIN I: 4.8 ng/mL — AB (ref <0.031)
Troponin I: 6.13 ng/mL (ref <0.031)

## 2014-11-05 LAB — CBC
HCT: 35.9 % — ABNORMAL LOW (ref 39.0–52.0)
Hemoglobin: 12.1 g/dL — ABNORMAL LOW (ref 13.0–17.0)
MCH: 29.2 pg (ref 26.0–34.0)
MCHC: 33.7 g/dL (ref 30.0–36.0)
MCV: 86.7 fL (ref 78.0–100.0)
Platelets: 247 10*3/uL (ref 150–400)
RBC: 4.14 MIL/uL — ABNORMAL LOW (ref 4.22–5.81)
RDW: 14.5 % (ref 11.5–15.5)
WBC: 10.1 10*3/uL (ref 4.0–10.5)

## 2014-11-05 LAB — HEPARIN LEVEL (UNFRACTIONATED): Heparin Unfractionated: 0.33 IU/mL (ref 0.30–0.70)

## 2014-11-05 LAB — CLOSTRIDIUM DIFFICILE BY PCR: Toxigenic C. Difficile by PCR: NEGATIVE

## 2014-11-05 MED ORDER — DILTIAZEM HCL ER COATED BEADS 180 MG PO CP24
180.0000 mg | ORAL_CAPSULE | Freq: Every day | ORAL | Status: DC
  Administered 2014-11-05 – 2014-11-06 (×2): 180 mg via ORAL
  Filled 2014-11-05 (×2): qty 1

## 2014-11-05 MED ORDER — COLCHICINE 0.6 MG PO TABS
0.6000 mg | ORAL_TABLET | Freq: Every day | ORAL | Status: DC
  Administered 2014-11-06: 0.6 mg via ORAL
  Filled 2014-11-05: qty 1

## 2014-11-05 NOTE — Progress Notes (Signed)
ANTICOAGULATION CONSULT NOTE - Follow Up Consult  Pharmacy Consult for Heparin Indication: atrial fibrillation  Allergies  Allergen Reactions  . Penicillins Anaphylaxis  . Corn Syrup [Glucose] Other (See Comments)    High fructose corn syrup causes gout  . Aspirin Adult Low [Aspirin] Itching and Rash    Dr. [name] suspects that long term caused a rash, short term use appears to be ok    Patient Measurements: Height: 5' 8.5" (174 cm) (Simultaneous filing. User may not have seen previous data.) Weight: 170 lb (77.111 kg) IBW/kg (Calculated) : 69.55   Vital Signs: Temp: 98.5 F (36.9 C) (03/20 0500) Temp Source: Oral (03/20 0500) BP: 104/49 mmHg (03/20 0500) Pulse Rate: 62 (03/20 0500)  Labs:  Recent Labs  11/03/14 2057 11/03/14 2108 11/04/14 0415 11/04/14 1630 11/05/14 0030 11/05/14 0433  HGB 13.9 15.0 12.3*  --   --  12.1*  HCT 40.1 44.0 36.5*  --   --  35.9*  PLT 232  --  232  --   --  247  APTT 33  --   --   --   --   --   LABPROT 15.4*  --   --   --   --   --   INR 1.20  --   --   --   --   --   HEPARINUNFRC  --   --  <0.10* 0.55  --  0.33  CREATININE 1.15 1.10 1.10  --   --   --   TROPONINI  --   --  8.73*  --  6.13* 5.33*    Estimated Creatinine Clearance: 58.9 mL/min (by C-G formula based on Cr of 1.1).   Medications:  Heparin @ 1500 units/hr  Assessment: Pedro Pope continues on heparin for afib w/ RVR (CHA2DS2-VASc of 2).   Heparin level remains therapeutic this AM.  CBC is slightly low but stable. Platelets are within normal limits. No bleeding reported.   Goal of Therapy:  Heparin level 0.3-0.7 units/ml Monitor platelets by anticoagulation protocol: Yes   Plan:  1) Continue heparin at 1500 units/hr 2) Follow up heparin level in AM 3) Follow-up long-term anticoagulation plan  Sloan Leiter, PharmD, BCPS Clinical Pharmacist [phone] 11/05/2014,7:39 AM

## 2014-11-05 NOTE — Progress Notes (Signed)
SUBJECTIVE:  Patient continues to feel better. He is not having the slight discomfort in the chest that he had before. Hopefully this means that colchicine is helping his symptoms. He continues to have watery stools. C. difficile is negative. We did have to add colchicine twice a day. This may be playing a role with his diarrhea now although he had diarrhea before the colchicine. The patient is on heparin. Two-dimensional echo yesterday showed that his ejection fraction remained in the 45% range. There was no significant pericardial effusion. He has gone in and out of atrial fibrillation since being here. Currently he is in sinus rhythm. He is on dual antiplatelet therapy for his recent stent. In addition he is on heparin for his atrial fibrillation. Decisions have not been finalized about his long-term drug therapy for all of these problems. Troponin was checked again yesterday. There has been no bump since his admission.   Filed Vitals:   11/04/14 2112 11/05/14 0500 11/05/14 1015 11/05/14 1323  BP: 108/47 104/49 118/47 95/56  Pulse:  62 74 63  Temp: 98.4 F (36.9 C) 98.5 F (36.9 C)  97.8 F (36.6 C)  TempSrc:  Oral  Oral  Resp: 20 18    Height:      Weight:  170 lb (77.111 kg)    SpO2: 96% 96%  98%     Intake/Output Summary (Last 24 hours) at 11/05/14 1403 Last data filed at 11/05/14 1300  Gross per 24 hour  Intake    720 ml  Output      0 ml  Net    720 ml    LABS: Basic Metabolic Panel:  Recent Labs  11/03/14 2057 11/03/14 2108 11/03/14 2253 11/04/14 0415  NA 133* 136  --  132*  K 3.0* 3.1*  --  3.7  CL 101 100  --  104  CO2 20  --   --  21  GLUCOSE 129* 132*  --  104*  BUN 30* 31*  --  28*  CREATININE 1.15 1.10  --  1.10  CALCIUM 8.6  --   --  8.5  MG  --   --  2.1  --    Liver Function Tests: No results for input(s): AST, ALT, ALKPHOS, BILITOT, PROT, ALBUMIN in the last 72 hours. No results for input(s): LIPASE, AMYLASE in the last 72 hours. CBC:  Recent  Labs  11/03/14 2057  11/04/14 0415 11/05/14 0433  WBC 11.4*  --  11.1* 10.1  NEUTROABS 8.9*  --   --   --   HGB 13.9  < > 12.3* 12.1*  HCT 40.1  < > 36.5* 35.9*  MCV 85.7  --  86.7 86.7  PLT 232  --  232 247  < > = values in this interval not displayed. Cardiac Enzymes:  Recent Labs  11/05/14 0030 11/05/14 0433 11/05/14 1120  TROPONINI 6.13* 5.33* 4.80*   BNP: Invalid input(s): POCBNP D-Dimer: No results for input(s): DDIMER in the last 72 hours. Hemoglobin A1C: No results for input(s): HGBA1C in the last 72 hours. Fasting Lipid Panel: No results for input(s): CHOL, HDL, LDLCALC, TRIG, CHOLHDL, LDLDIRECT in the last 72 hours. Thyroid Function Tests:  Recent Labs  11/03/14 2232  TSH 5.708*    RADIOLOGY: X-ray Chest Pa And Lateral  11/03/2014   CLINICAL DATA:  Palpitations and left upper extremity paresthesias  EXAM: CHEST  2 VIEW  COMPARISON:  09/18/2010  FINDINGS: There is mild cardiomegaly. There is mild  interstitial prominence. There is focal opacity in the posterior base which may represent a left lower lobe infiltrate. There are no effusions. Hilar and mediastinal contours are normal.  IMPRESSION: Small focal airspace opacity in the posterior base, probably left lower lobe, more likely atelectatic but infectious infiltrate not excluded. Mild generalized interstitial prominence may be chronic.   Electronically Signed   By: Andreas Newport M.D.   On: 11/03/2014 23:41    PHYSICAL EXAM   patient is oriented to person time and place. Affect is normal. There are 5 or 6 family members in the room all of which are very nice and helpful. Lungs are clear. Respiratory effort is not labored. Cardiac exam reveals an S1 and S2. Abdomen is soft. There is no peripheral edema.   TELEMETRY: Currently on telemetry the rhythm is sinus. I personally reviewed telemetry today November 05, 2014   ASSESSMENT AND PLAN:    Stented coronary artery     Patient had a recent MI with PCI. He is  on dual antiplatelet therapy.    Current symptoms of chest discomfort. We are treating him for the possibility of post MI pleuropericarditis. He is on colchicine 0.6 mg twice a day. He is not having any further symptoms. Because of his diarrhea, I'm going to lower his dose to 0.6 mg once daily.    Hyperlipidemia    Cardiomyopathy, ischemic      His EF is in the 40-45% range. He is on an ACE inhibitor and beta blocker. We are also using diltiazem because of his atrial fibrillation. I feel this is reasonable in his case.    Paroxysmal atrial fibrillation     It had been noted that he was in and out of atrial fibrillation. Today he is in sinus rhythm. He is on dual antiplatelet therapy for his recent stent. He is on IV heparin as of today. It is difficult to decide about adding an antiarrhythmic in this case. Clinically we feel he has pleuropericarditis. I'm hesitant to have him on very potent combinations of anticoagulants. Heparin will be continued tonight. Decision will have to be made tomorrow as to whether or not heparin will be stopped with or without the addition of an oral anticoagulant. It would appear that his atrial fib is related to his other cardiac issues. He is currently on IV Cardizem. I'll be changing him to oral today.    Elevated troponin     History troponin has continued to come down since his admission.    CAD (coronary artery disease), native coronary artery    Diarrhea    At this point etiology remains unclear. He had this before colchicine was added. C. difficile is negative. Today I'm lowering his colchicine to 0.6 once daily in hopes that he will continue to feel well and possibly have further improvement in his diarrhea. We do not want to use nonsteroidal anti-inflammatory meds because of the multitude of other medications that he needs with bleeding risk.  Dola Argyle 11/05/2014 2:03 PM

## 2014-11-06 ENCOUNTER — Encounter (HOSPITAL_COMMUNITY): Payer: Self-pay | Admitting: Physician Assistant

## 2014-11-06 DIAGNOSIS — I2510 Atherosclerotic heart disease of native coronary artery without angina pectoris: Secondary | ICD-10-CM | POA: Diagnosis present

## 2014-11-06 DIAGNOSIS — M109 Gout, unspecified: Secondary | ICD-10-CM | POA: Diagnosis present

## 2014-11-06 DIAGNOSIS — Z9861 Coronary angioplasty status: Secondary | ICD-10-CM

## 2014-11-06 DIAGNOSIS — I241 Dressler's syndrome: Secondary | ICD-10-CM | POA: Diagnosis present

## 2014-11-06 DIAGNOSIS — I480 Paroxysmal atrial fibrillation: Secondary | ICD-10-CM | POA: Diagnosis present

## 2014-11-06 DIAGNOSIS — N529 Male erectile dysfunction, unspecified: Secondary | ICD-10-CM | POA: Diagnosis present

## 2014-11-06 DIAGNOSIS — I255 Ischemic cardiomyopathy: Secondary | ICD-10-CM | POA: Diagnosis present

## 2014-11-06 DIAGNOSIS — R7303 Prediabetes: Secondary | ICD-10-CM | POA: Diagnosis present

## 2014-11-06 LAB — CBC
HCT: 37.4 % — ABNORMAL LOW (ref 39.0–52.0)
Hemoglobin: 12.4 g/dL — ABNORMAL LOW (ref 13.0–17.0)
MCH: 28.8 pg (ref 26.0–34.0)
MCHC: 33.2 g/dL (ref 30.0–36.0)
MCV: 86.8 fL (ref 78.0–100.0)
PLATELETS: 292 10*3/uL (ref 150–400)
RBC: 4.31 MIL/uL (ref 4.22–5.81)
RDW: 14.5 % (ref 11.5–15.5)
WBC: 9.9 10*3/uL (ref 4.0–10.5)

## 2014-11-06 LAB — HEPARIN LEVEL (UNFRACTIONATED): Heparin Unfractionated: <0.1 IU/mL — ABNORMAL LOW (ref 0.30–0.70)

## 2014-11-06 MED ORDER — DILTIAZEM HCL ER COATED BEADS 180 MG PO CP24: 180.0000 mg | ORAL_CAPSULE | Freq: Every day | ORAL | Status: DC

## 2014-11-06 MED ORDER — ATORVASTATIN CALCIUM 40 MG PO TABS: 40.0000 mg | ORAL_TABLET | Freq: Every day | ORAL | Status: DC

## 2014-11-06 MED ORDER — COLCHICINE 0.6 MG PO TABS: 0.6000 mg | ORAL_TABLET | Freq: Every day | ORAL | Status: DC

## 2014-11-06 NOTE — Telephone Encounter (Signed)
Pedro Pope is aware to follow up with PCP.  To note, patient was hospitalized due to low potassium.

## 2014-11-06 NOTE — Discharge Summary (Addendum)
Discharge Summary   Patient ID: Pedro Pope MRN: [id number], DOB/AGE: [birth year] 74 y.o. Admit date: 11/03/2014 D/C date:     11/06/2014  Primary Cardiologist: Dr. Gwenlyn Found  Principal Problem:   PAF (paroxysmal atrial fibrillation)   Dressler's syndrome Active Problems:   Hyperlipidemia   Elevated liver enzymes   Diarrhea   Ischemic cardiomyopathy   CAD (coronary artery disease), native coronary artery   ED (erectile dysfunction)   Gout   Pre-diabetes    Admission Dates: 11/03/14-11/06/14 Discharge Diagnosis: New onset paroxysmal atrial fibrillation and Dressler's syndrome    HPI: Pedro Pope is a 74 y.o. male with a history of recent anterolateral STEMI (s/p prox LAD DES 10/30/14), ischemic cardiomyopathy EF 40-45%, and HLD who presented to [facility] on 11/03/14 with L arm tingling and new onset AF.  Pedro Pope was feeling well until after dinner on the evening of admission when he developed L arm tingling. This was similar to how he felt during his STEMI earlier this week, so he decided to call EMS. He denied CP/pressure, SOB, diaphoresis, N/V or palpitations. He called EMS and upon their arrival he was noted to be in afib with rates in the 110s. There were also inferior and lateral ST elevations, so a code STEMI was called. Upon arrival in the ED Pedro Pope continued to be in atrial fibrillation with RVR. He had 71mm STE in lead III and 2-32mm ST elevation in the lateral leads. He was evaluated by Dr. Gwenlyn Found in the ED and it was felt that this EKG was still an evolution of his event earlier this week. He spontaneously converted to NSR and no longer felt the L arm tingling. Given that his symptoms resolved with sinus rhythm and his evolving EKG he was not taken to the cath lab. He was started on a diltiazem and heparin infusions. He continued to switch back and forth between afib and sinus rhythm while in the ED. He noted an odd feeling in his chest each time he went back into  afib. Of note, Pedro Pope reports multiple loose stools since being discharged from the hospital.   Hospital Course  Paroxysmal atrial fibrillation: Currently he is maintaining NSR with freq ventricular ectopy. Has been out of atrial fibrillation for the past 24 hours  -- Continue home metoprolol 12.5mg  BID and dilt CD 180mg  qd -- He was placed on a heparin gtt. Dr Burt Knack reviewed the pros and cons of oral anticoagulation with the patient. His CHADS-Vasc = 2-3 (age 59, CAD 1, Reduced EF but no clinical CHF +/- 1). He was concerned about risk of hemorrhagic pericarditis with aggressive anticoagulation using either triple therapy or the combination of potent antiplatelet drugs with oral anticoagulants. We are hopeful that his atrial fibrillation will be limited to the post MI and possibly further related to his pericarditis. For now we will continue ASA/Brilinta -- Recommend arranging an event monitor in approximately 1-2 weeks.  Possible Dresslers syndrome- Initially called an inferior STEMI in the ER but when reviewed with Dr. Gwenlyn Found he felt this was EKG changes as evolution of prior STEMI. Code STEMI was cancelled. Given his report of pleuritic CP and ST elevations in leads that were not a part of his infarct-related artery, felt to be likely Dressler's syndrome.  -- Continue Colchicine 0.6 mg daily -- No NSAIDS with DAPT and heparin gtt currently and recent MI  CAD s/p recent STEMI: No recurrent CP.  -- Continue home ASA/Brilinta, beta blocker and statin -- Troponin  trending downwards 6.13--> 5.33--> 4.8  Ischemic CM-  -- 2D ECHO 11/04/14 w/ EF 40-45% with LV WMA, G2DD, mild MR, mild LA dilation, mod TR.There was no significant pericardial effusion -- No s/s CHF -- Continue ACE and BB.   Diarrhea:  -- C. difficile is negative -- Colchicine 0.6 mg decreased to once a day  Pre-diabetes- HGA1c 6.1 -- Counseled on diet and exercise -- Follow with PCP  Elevated TSH- mild @ 5.7.  Follow with PCP  HLD- LDL 100. Goal <70. He was placed on atorvastatin 10mg  due to mildly elevated LFTs (AST 383 and ALT 76.) LFTs repeated on the 3/16 which showed AST has improved to 121 and ALT 48. This was likely secondary to his STEMI. We will increase statin to 40mg  and recommend repeating LFTs in 1 month.   The patient has had an uncomplicated hospital course and is recovering well. He has been seen by Dr. Burt Knack today and deemed ready for discharge home. All follow-up appointments have been scheduled. Discharge medications are listed below.   ADDENDUM: while getting dressed to leave he went back into atrial fibrillation with CVR. He was asymptomatic. He walked without complications and was sent home.    Discharge Vitals: Blood pressure 112/56, pulse 72, temperature 98.7 F (37.1 C), temperature source Oral, resp. rate 18, height 5' 8.5" (1.74 m), weight 170 lb (77.112 kg), SpO2 95 %.  Labs: Lab Results  Component Value Date   WBC 9.9 11/06/2014   HGB 12.4* 11/06/2014   HCT 37.4* 11/06/2014   MCV 86.8 11/06/2014   PLT 292 11/06/2014    Recent Labs Lab 11/01/14 1055  11/04/14 0415  NA 135  < > 132*  K 3.9  < > 3.7  CL 102  < > 104  CO2 26  < > 21  BUN 21  < > 28*  CREATININE 1.14  < > 1.10  CALCIUM 8.9  < > 8.5  PROT 6.5  --   --   BILITOT 2.0*  --   --   ALKPHOS 47  --   --   ALT 48  --   --   AST 121*  --   --   GLUCOSE 125*  < > 104*  < > = values in this interval not displayed.  Recent Labs  11/04/14 0415 11/05/14 0030 11/05/14 0433 11/05/14 1120  TROPONINI 8.73* 6.13* 5.33* 4.80*   Lab Results  Component Value Date   CHOL 158 10/31/2014   HDL 29* 10/31/2014   LDLCALC 100* 10/31/2014   TRIG 145 10/31/2014     Diagnostic Studies/Procedures   X-ray Chest Pa And Lateral  11/03/2014   CLINICAL DATA:  Palpitations and left upper extremity paresthesias  EXAM: CHEST  2 VIEW  COMPARISON:  09/18/2010  FINDINGS: There is mild cardiomegaly. There is mild  interstitial prominence. There is focal opacity in the posterior base which may represent a left lower lobe infiltrate. There are no effusions. Hilar and mediastinal contours are normal.  IMPRESSION: Small focal airspace opacity in the posterior base, probably left lower lobe, more likely atelectatic but infectious infiltrate not excluded. Mild generalized interstitial prominence may be chronic.   Electronically Signed   By: Andreas Newport M.D.   On: 11/03/2014 23:41    2D ECHO  Study Date: 11/04/2014 LV EF: 40% -   45% Study Conclusions - Left ventricle: There is akinesis of the mid and distal anterior,   mid anteroseptal and distal septal walls. The cavity  size was   normal. There was mild concentric hypertrophy. Systolic function   was mildly to moderately reduced. The estimated ejection fraction   was in the range of 40% to 45%. Wall motion was normal; there   were no regional wall motion abnormalities. Features are   consistent with a pseudonormal left ventricular filling pattern,   with concomitant abnormal relaxation and increased filling   pressure (grade 2 diastolic dysfunction). Doppler parameters are   consistent with elevated ventricular end-diastolic filling   pressure. - Mitral valve: There was mild regurgitation. - Left atrium: The atrium was mildly dilated. - Right atrium: The atrium was normal in size. - Tricuspid valve: There was moderate regurgitation. - Pulmonary arteries: Systolic pressure was within the normal   range. PA peak pressure: 31 mm Hg (S). - Pericardium, extracardiac: There was no pericardial effusion. Impressions: - There is mild to moderate LV dysfunction with regional wall   motion abnormalities as described above.    Cardiac catheterization 10/30/2014 HEMODYNAMICS:   AO SYSTOLIC/AO DIASTOLIC: 196/22  LV SYSTOLIC/LV DIASTOLIC: 297/98  ANGIOGRAPHIC RESULTS:   1. Left main; normal  2. LAD; occluded proximally. This was the infarct  related vessel 3. Left circumflex; dominant with an 80% fairly focal proximal AV groove stenosis and 60% mid.  4. Right coronary artery; nondominant with a 90% mid stenosis 5. Left ventriculography; was not performed  IMPRESSION:Pedro Pope has an occluded proximal LAD and it is having an anterolateral STEMI. Will proceed with PCI and stenting using Brilenta, Angiomax, and drug eluding stent. Overall impression: successful direct intervention of an occluded proximal LAD with drug-eluting stent, Angiomax and double antiplatelet therapy using Brilenta. An LV gram was not performed because the elevated LVEDP. 2-D echo will be obtained. Patient will be treated with standard post-MI medications including a beta blocker, statin drug. Medical therapy will be recommended for the residual circumflex disease at this time. The radial sheath was removed and a TR band was placed on the right breast to achieve patent hemostasis. The patient left the lab in stable condition.           Discharge Medications     Medication List    TAKE these medications        aspirin 81 MG chewable tablet  Chew 1 tablet (81 mg total) by mouth daily.     atorvastatin 40 MG tablet  Commonly known as:  LIPITOR  Take 1 tablet (40 mg total) by mouth daily at 6 PM.     colchicine 0.6 MG tablet  Take 1 tablet (0.6 mg total) by mouth daily.     diltiazem 180 MG 24 hr capsule  Commonly known as:  CARDIZEM CD  Take 1 capsule (180 mg total) by mouth daily.     lisinopril 2.5 MG tablet  Commonly known as:  PRINIVIL,ZESTRIL  Take 1 tablet (2.5 mg total) by mouth daily. Taken at night     metoprolol tartrate 25 MG tablet  Commonly known as:  LOPRESSOR  Take 0.5 tablets (12.5 mg total) by mouth 2 (two) times daily.     nitroGLYCERIN 0.4 MG SL tablet  Commonly known as:  NITROSTAT  Place 1 tablet (0.4 mg total) under the tongue every 5 (five) minutes as needed for chest pain.     ticagrelor 90 MG Tabs tablet   Commonly known as:  BRILINTA  Take 1 tablet (90 mg total) by mouth 2 (two) times daily.     vitamin C 500 MG tablet  Commonly known as:  ASCORBIC ACID  Take 500 mg by mouth daily.     vitamin C 500 MG tablet  Commonly known as:  ASCORBIC ACID  Take 500 mg by mouth daily.        Disposition   The patient will be discharged in stable condition to home.  Follow-up Information    Follow up with Precious Reel, MD.   Specialty:  Internal Medicine   Contact information:   [address] [place] [address] [phone]       Follow up with Truitt Merle, NP On 11/08/2014.   Specialty:  Nurse Practitioner   Why:  @ 11:30am   Contact information:   [place] [place] [address] [phone]       Follow up with Lorretta Harp, MD On 12/06/2014.   Specialty:  Cardiology   Why:  @ 9:45am   Contact information:   [address] [place] [address] [phone]         Duration of Discharge Encounter: Greater than 30 minutes including physician and PA time.  SignedAngelena Form R PA-C 11/06/2014, 10:40 AM

## 2014-11-06 NOTE — Progress Notes (Signed)
Pt converted back to SR 78. Continue to monitor. Carroll Kinds RN

## 2014-11-06 NOTE — Progress Notes (Signed)
ANTICOAGULATION CONSULT NOTE - Follow Up Consult  Pharmacy Consult for Heparin  Indication: atrial fibrillation  Allergies  Allergen Reactions  . Penicillins Anaphylaxis  . Corn Syrup [Glucose] Other (See Comments)    High fructose corn syrup causes gout  . Aspirin Adult Low [Aspirin] Itching and Rash    Dr. [name] suspects that long term caused a rash, short term use appears to be ok    Patient Measurements: Height: 5' 8.5" (174 cm) (Simultaneous filing. User may not have seen previous data.) Weight: 170 lb (77.111 kg) IBW/kg (Calculated) : 69.55  Vital Signs: Temp: 98.7 F (37.1 C) (03/21 0536) Temp Source: Oral (03/21 0536) BP: 112/56 mmHg (03/21 0536) Pulse Rate: 72 (03/21 0536)  Labs:  Recent Labs  11/03/14 2057 11/03/14 2108  11/04/14 0415 11/04/14 1630 11/05/14 0030 11/05/14 0433 11/05/14 1120 11/06/14 0432  HGB 13.9 15.0  --  12.3*  --   --  12.1*  --  12.4*  HCT 40.1 44.0  --  36.5*  --   --  35.9*  --  37.4*  PLT 232  --   --  232  --   --  247  --  292  APTT 33  --   --   --   --   --   --   --   --   LABPROT 15.4*  --   --   --   --   --   --   --   --   INR 1.20  --   --   --   --   --   --   --   --   HEPARINUNFRC  --   --   < > <0.10* 0.55  --  0.33  --  <0.10*  CREATININE 1.15 1.10  --  1.10  --   --   --   --   --   TROPONINI  --   --   < > 8.73*  --  6.13* 5.33* 4.80*  --   < > = values in this interval not displayed.  Estimated Creatinine Clearance: 58.9 mL/min (by C-G formula based on Cr of 1.1).  Assessment: Undetectable heparin level, infusing good per RN.   Goal of Therapy:  Heparin level 0.3-0.7 units/ml Monitor platelets by anticoagulation protocol: Yes   Plan:  -Increase heparin to 1700 units/hr -1400 HL -Daily CBC/HL -Monitor for bleeding  Narda Bonds 11/06/2014,6:51 AM

## 2014-11-06 NOTE — Progress Notes (Signed)
Patient Name: Pedro Pope Date of Encounter: 11/06/2014     Active Problems:   Stented coronary artery   Hyperlipidemia   Cardiomyopathy, ischemic   Atrial flutter with rapid ventricular response   Elevated troponin   CAD (coronary artery disease), native coronary artery   Diarrhea    SUBJECTIVE  No further CP or palpitation. Feeling well.   CURRENT MEDS . aspirin  81 mg Oral Daily  . atorvastatin  10 mg Oral q1800  . colchicine  0.6 mg Oral Daily  . diltiazem  180 mg Oral Daily  . lisinopril  2.5 mg Oral Daily  . metoprolol tartrate  12.5 mg Oral BID  . ticagrelor  90 mg Oral BID  . vitamin C  500 mg Oral Daily    OBJECTIVE  Filed Vitals:   11/05/14 1712 11/05/14 2032 11/05/14 2128 11/06/14 0536  BP: 105/54 100/60 105/52 112/56  Pulse:  61 64 72  Temp:  98.4 F (36.9 C)  98.7 F (37.1 C)  TempSrc:  Oral  Oral  Resp:  18  18  Height:      Weight:    170 lb (77.112 kg)  SpO2:  99%  95%    Intake/Output Summary (Last 24 hours) at 11/06/14 0846 Last data filed at 11/05/14 2015  Gross per 24 hour  Intake    480 ml  Output      0 ml  Net    480 ml   Filed Weights   11/04/14 0400 11/05/14 0500 11/06/14 0536  Weight: 169 lb 6.4 oz (76.839 kg) 170 lb (77.111 kg) 170 lb (77.112 kg)    PHYSICAL EXAM  General: Pleasant, NAD. Neuro: Alert and oriented X 3. Moves all extremities spontaneously. Psych: Normal affect. HEENT:  Normal  Neck: Supple without bruits or JVD. Lungs:  Resp regular and unlabored, CTA. Heart: RRR no s3, s4, or murmurs. Abdomen: Soft, non-tender, non-distended, BS + x 4.  Extremities: No clubbing, cyanosis or edema. DP/PT/Radials 2+ and equal bilaterally.  Accessory Clinical Findings  CBC  Recent Labs  11/03/14 2057  11/05/14 0433 11/06/14 0432  WBC 11.4*  < > 10.1 9.9  NEUTROABS 8.9*  --   --   --   HGB 13.9  < > 12.1* 12.4*  HCT 40.1  < > 35.9* 37.4*  MCV 85.7  < > 86.7 86.8  PLT 232  < > 247 292  < > = values in this  interval not displayed. Basic Metabolic Panel  Recent Labs  11/03/14 2057 11/03/14 2108 11/03/14 2253 11/04/14 0415  NA 133* 136  --  132*  K 3.0* 3.1*  --  3.7  CL 101 100  --  104  CO2 20  --   --  21  GLUCOSE 129* 132*  --  104*  BUN 30* 31*  --  28*  CREATININE 1.15 1.10  --  1.10  CALCIUM 8.6  --   --  8.5  MG  --   --  2.1  --     Cardiac Enzymes  Recent Labs  11/05/14 0030 11/05/14 0433 11/05/14 1120  TROPONINI 6.13* 5.33* 4.80*   Thyroid Function Tests  Recent Labs  11/03/14 2232  TSH 5.708*    TELE  Currently he is maintaining NSR with freq ventricular ectopy.   Radiology/Studies  X-ray Chest Pa And Lateral  11/03/2014   CLINICAL DATA:  Palpitations and left upper extremity paresthesias  EXAM: CHEST  2 VIEW  COMPARISON:  09/18/2010  FINDINGS: There is mild cardiomegaly. There is mild interstitial prominence. There is focal opacity in the posterior base which may represent a left lower lobe infiltrate. There are no effusions. Hilar and mediastinal contours are normal.  IMPRESSION: Small focal airspace opacity in the posterior base, probably left lower lobe, more likely atelectatic but infectious infiltrate not excluded. Mild generalized interstitial prominence may be chronic.   Electronically Signed   By: Andreas Newport M.D.   On: 11/03/2014 23:41    ASSESSMENT AND PLAN  [name] is a 57M with recent anterolateral STEMI (s/p prox LAD DES 11/01/14), ischemic cardiomyopathy EF 40-45%, and HLD who presented to [facility] on 11/03/14 with L arm tingling and new onset AF.  Atrial fibrillation: [name] continues to go in and out of afib and sinus rhythm. Currently he is maintaining NSR with freq ventricular ectopy. -- Continue home metoprolol 12.5mg  BID and dilt CD 180mg  qd -- Currently on a heparin gtt/ (CHA2DS2-VASc of at least 2). Dr. Burt Knack to discuss long term AC.   Possible Dresslers syndrome- Initially called an inferior STEMI in the ER but when  reviewed with Dr. Gwenlyn Found he felt this was EKG changes as evolution of prior STEMI. Code STEMI was cancelled. Given his report of pleuritic CP and ST elevations in leads that were not a part of his infarct-related artery, felt to be likely Dressler's syndrome.  -- Continue Colchicine 0.6 mg daily -- No NSAIDS with DAPT and heparin gtt currently and recent MI   CAD s/p recent STEMI: No recurrent CP.  -- Continue home ASA/Brilinta, beta blocker and statin -- Troponin trending downwards 6.13--> 5.33--> 4.8  Ischemic CM-  -- 2D echo yesterday showed that his ejection fraction remained in the 45% range. There was no significant pericardial effusion -- No s/s CHF -- Continue ACE and BB.    Diarrhea:  -- C. difficile is negative -- Colchicine 0.6 mg decreased to once a day  Pre-diabetes- HGA1c 6.1 -- Counseled on diet and exercise -- Follow with PCP  Elevated TSH- mild. Follow with PCP   Signed, Eileen Stanford PA-C  Pager [phone]  Patient seen, examined. Available data reviewed. Agree with findings, assessment, and plan as outlined by Nell Range, PA-C. The patient is independently interviewed and examined. I carefully reviewed all of the available data. The patient had a recent anterior wall STEMI and was treated with primary PCI with recommendations for aspirin and brilinta 12 months. He now has a clinical presentation consistent with post MI pericarditis. He is currently on IV heparin and the question of whether to anticoagulate him has not been fully answered today.  Exam reveals an alert, oriented male in no distress. His lung fields are clear. Heart is regular rate and rhythm with frequent premature beats. There is no murmur or gallop present. There is no peripheral edema.  I have reviewed the pros and cons of oral anticoagulation with the patient. His CHADS-Vasc = 2-3 (age 71, CAD 1, Reduced EF but no clinical CHF +/- 1). I am concerned about risk of hemorrhagic  pericarditis with aggressive anticoagulation using either triple therapy or the combination of potent antiplatelet drugs with oral anticoagulants. I am hopeful that his atrial fibrillation will be limited to the post MI. And possibly further related to his pericarditis. I reviewed telemetry and he has not had any evidence of atrial fibrillation in the last 24 hours. After discussion of options, we decided to continue with aspirin and brilinta. I am going to stop  his IV heparin. I think the patient is stable for discharge and he is eager to go home. We will discharge him on colchicine 0.6 mg daily. Otherwise will continue his current medical program. I would recommend arranging an event monitor in approximately 1-2 weeks.  Sherren Mocha, M.D. 11/06/2014 9:20 AM

## 2014-11-06 NOTE — Progress Notes (Signed)
Pt called out stating he felt like he was in AFib again. Monitor had been dc'd for discharge. Monitor replaced Afib 97. Nell Range PA made aware. Stated to walk pt after lunch and see how heart rate increases. May dc home if no issues with elevated HR. Will continue to monitor. Carroll Kinds RN

## 2014-11-06 NOTE — Discharge Instructions (Signed)

## 2014-11-06 NOTE — Care Management (Signed)
Medicare Important Message given? YES   (If response is "NO", the following Medicare IM given date fields will be blank)   Date Medicare IM given:   Medicare IM given by: Graves-Bigelow, Lovinia Snare  

## 2014-11-08 ENCOUNTER — Ambulatory Visit (INDEPENDENT_AMBULATORY_CARE_PROVIDER_SITE_OTHER): Payer: Medicare Other | Admitting: Nurse Practitioner

## 2014-11-08 ENCOUNTER — Encounter: Payer: Self-pay | Admitting: Nurse Practitioner

## 2014-11-08 VITALS — BP 112/58 | HR 58 | Ht 68.0 in | Wt 168.0 lb

## 2014-11-08 DIAGNOSIS — I255 Ischemic cardiomyopathy: Secondary | ICD-10-CM

## 2014-11-08 DIAGNOSIS — I480 Paroxysmal atrial fibrillation: Secondary | ICD-10-CM | POA: Diagnosis not present

## 2014-11-08 DIAGNOSIS — Z79899 Other long term (current) drug therapy: Secondary | ICD-10-CM | POA: Diagnosis not present

## 2014-11-08 DIAGNOSIS — I213 ST elevation (STEMI) myocardial infarction of unspecified site: Secondary | ICD-10-CM

## 2014-11-08 NOTE — Patient Instructions (Addendum)
Stay on your current medicines  Come in one week for an event monitor  Can we get his OV with Dr. Gwenlyn Found moved to about 2 to 3 weeks   Call the [place] office at [phone] if you have any questions, problems or concerns.

## 2014-11-08 NOTE — Progress Notes (Signed)
CARDIOLOGY OFFICE NOTE  Date:  11/08/2014    Pedro Pope Date of Birth: 06/10/1941 Medical Record #[id number]  PCP:  Precious Reel, MD  Cardiologist:  Gwenlyn Found    Chief Complaint  Patient presents with  . PAF    Post hospital visit - seen for Dr. Gwenlyn Found    History of Present Illness: Pedro Pope is a 74 y.o. male who presents today for a post hospital/TOC visit - however no phone call noted to be documented. He is seen for Dr. Gwenlyn Found.   He has a history of recent anterolateral STEMI (s/p prox LAD DES 10/30/14), ischemic cardiomyopathy EF 40-45%, and HLD who presented back to [facility] on 11/03/14 with L arm tingling and new onset AF.  Pedro Pope was feeling well until after dinner on the evening of admission when he developed L arm tingling. This was similar to how he felt during his STEMI earlier this week, so he decided to call EMS. He denied CP/pressure, SOB, diaphoresis, N/V or palpitations. He called EMS and upon their arrival he was noted to be in afib with rates in the 110s. There were also inferior and lateral ST elevations, so a code STEMI was called. Upon arrival in the ED Pedro Pope continued to be in atrial fibrillation with RVR. He had 29mm STE in lead III and 2-35mm ST elevation in the lateral leads. He was evaluated by Dr. Gwenlyn Found in the ED and it was felt that this EKG was still an evolution of his event earlier this week. He spontaneously converted to NSR and no longer felt the L arm tingling. Given that his symptoms resolved with sinus rhythm and his evolving EKG he was not taken to the cath lab. He was started on a diltiazem and heparin infusions. He continued to switch back and forth between afib and sinus rhythm while in the ED. He noted an odd feeling in his chest each time he went back into afib. Of note, Pedro Pope reports multiple loose stools since being discharged from the hospital.  He was placed on a heparin gtt. Dr Burt Knack reviewed the pros and cons of  oral anticoagulation with the patient. His CHADS-Vasc = 2-3 (age 10, CAD 1, Reduced EF but no clinical CHF +/- Dr. Burt Knack was concerned about risk of hemorrhagic pericarditis with aggressive anticoagulation using either triple therapy or the combination of potent antiplatelet drugs with oral anticoagulants. He was hopeful that his atrial fibrillation would be limited to the post MI and possibly further related to his pericarditis. For now he will continue ASA/Brilinta. It has been recommended that he have an event monitor in approximately 1-2 weeks post discharge. He went back home on CCB and BB therapy. Due to concern for Dresslers - he was given colchicine but the dose cut back due to his loose stools. Did have some trending down of his LFTs and his statin dose was increased.   Comes back today. Here with his wife. Has only been home 2 days. "So far, so good". No real big problems. Still with a little pleuritic pain but that is improving as well. No chest pain. No arm tingling. Not short of breath. Not dizzy or lightheaded. Feels like his rhythm has been ok. Feels ok with his medicines. Not doing much in the way of activity. Wife has lots of questions - regarding diet mostly, possible sleep apnea. His stools are firmer.   Past Medical History  Diagnosis Date  . Prostate cancer   .  Gout   . Rheumatic fever 1947  . Chronic headaches   . Fatty liver   . Anosmia   . ED (erectile dysfunction)   . CAD (coronary artery disease), native coronary artery     a. 10/29/2013 antlat STEMI s/p DES to pLAD; residual 80 % proximal and 60% mid LCx diease  . Dressler's syndrome     a. placed on colchicine   . Ischemic cardiomyopathy     a. 2D ECHO 11/04/14 w/ EF 40-45% with LV WMA, G2DD, mild MR, mild LA dilation, mod TR.  Marland Kitchen PAF (paroxysmal atrial fibrillation)     a. Dx on 11/03/14 admission. Not placed on longterm AC due to DAPT w/ receent DES. Will plan for outpt heart monitor   . Pre-diabetes     a. HgA1c 6.1  in 10/2014  . Elevated TSH     Past Surgical History  Procedure Laterality Date  . Prostatectomy    . Tonsillectomy    . Coronary angioplasty with stent placement  10/30/14    STEMI s/p DES to proximal LAD; residual 80 % proximal and 60% mid LCx diease  . Left heart catheterization with coronary angiogram N/A 10/30/2014    Procedure: LEFT HEART CATHETERIZATION WITH CORONARY ANGIOGRAM;  Surgeon: Lorretta Harp, MD;  Location: [facility] CATH LAB;  Service: Cardiovascular;  Laterality: N/A;  . Percutaneous coronary stent intervention (pci-s)  10/30/2014    Procedure: PERCUTANEOUS CORONARY STENT INTERVENTION (PCI-S);  Surgeon: Lorretta Harp, MD;  Location: [facility] CATH LAB;  Service: Cardiovascular;;     Medications: Current Outpatient Prescriptions  Medication Sig Dispense Refill  . Ascorbic Acid (VITAMIN C) 500 MG tablet Take 500 mg by mouth daily.      Marland Kitchen aspirin 81 MG chewable tablet Chew 1 tablet (81 mg total) by mouth daily.    Marland Kitchen atorvastatin (LIPITOR) 40 MG tablet Take 1 tablet (40 mg total) by mouth daily at 6 PM. 30 tablet 11  . colchicine 0.6 MG tablet Take 1 tablet (0.6 mg total) by mouth daily. 30 tablet 3  . diltiazem (CARDIZEM CD) 180 MG 24 hr capsule Take 1 capsule (180 mg total) by mouth daily. 30 capsule 6  . lisinopril (PRINIVIL,ZESTRIL) 2.5 MG tablet Take 1 tablet (2.5 mg total) by mouth daily. Taken at night 30 tablet 5  . metoprolol tartrate (LOPRESSOR) 25 MG tablet Take 0.5 tablets (12.5 mg total) by mouth 2 (two) times daily. 30 tablet 5  . nitroGLYCERIN (NITROSTAT) 0.4 MG SL tablet Place 1 tablet (0.4 mg total) under the tongue every 5 (five) minutes as needed for chest pain. 25 tablet 5  . ticagrelor (BRILINTA) 90 MG TABS tablet Take 1 tablet (90 mg total) by mouth 2 (two) times daily. 180 tablet 3  . vitamin C (ASCORBIC ACID) 500 MG tablet Take 500 mg by mouth daily.     No current facility-administered medications for this visit.    Allergies: Allergies  Allergen  Reactions  . Penicillins Anaphylaxis  . Corn Syrup [Glucose] Other (See Comments)    High fructose corn syrup causes gout  . Aspirin Adult Low [Aspirin] Itching and Rash    Dr. [name] suspects that long term caused a rash, short term use appears to be ok    Social History: The patient  reports that he quit smoking about 22 years ago. He does not have any smokeless tobacco history on file. He reports that he drinks alcohol. He reports that he does not use illicit drugs.  Family History: The patient's family history includes Cancer in an other family member; Coronary artery disease (age of onset: 61) in his brother; Diabetes in an other family member; Gout in an other family member; Hyperlipidemia in an other family member; Hypertension in an other family member.   Review of Systems: Please see the history of present illness.   All other systems are reviewed and negative.   Physical Exam: VS:  BP 112/58 mmHg  Pulse 58  Ht 5\' 8"  (1.727 m)  Wt 168 lb (76.204 kg)  BMI 25.55 kg/m2 .  BMI Body mass index is 25.55 kg/(m^2).  Wt Readings from Last 3 Encounters:  11/08/14 168 lb (76.204 kg)  11/06/14 170 lb (77.112 kg)  10/30/14 173 lb 4.5 oz (78.6 kg)    General: Pleasant. Well developed, well nourished and in no acute distress.  HEENT: Normal. Neck: Supple, no JVD, carotid bruits, or masses noted.  Cardiac: Regular rate and rhythm. No murmurs, rubs, or gallops. No edema.  Respiratory:  Lungs are clear to auscultation bilaterally with normal work of breathing.  GI: Soft and nontender.  MS: No deformity or atrophy. Gait and ROM intact. Skin: Warm and dry. Color is normal.  Neuro:  Strength and sensation are intact and no gross focal deficits noted.  Psych: Alert, appropriate and with normal affect.   LABORATORY DATA:  EKG:  EKG is ordered today. This demonstrates sinus brady - has evolving changes noted.   Lab Results  Component Value Date   WBC 9.9 11/06/2014   HGB 12.4*  11/06/2014   HCT 37.4* 11/06/2014   PLT 292 11/06/2014   GLUCOSE 104* 11/04/2014   CHOL 158 10/31/2014   TRIG 145 10/31/2014   HDL 29* 10/31/2014   LDLCALC 100* 10/31/2014   ALT 48 11/01/2014   AST 121* 11/01/2014   NA 132* 11/04/2014   K 3.7 11/04/2014   CL 104 11/04/2014   CREATININE 1.10 11/04/2014   BUN 28* 11/04/2014   CO2 21 11/04/2014   TSH 5.708* 11/03/2014   INR 1.20 11/03/2014   HGBA1C 6.1* 10/31/2014    BNP (last 3 results) No results for input(s): BNP in the last 8760 hours.  ProBNP (last 3 results) No results for input(s): PROBNP in the last 8760 hours.   Other Studies Reviewed Today: 2D ECHO  Study Date: 11/04/2014 LV EF: 40% -  45% Study Conclusions - Left ventricle: There is akinesis of the mid and distal anterior, mid anteroseptal and distal septal walls. The cavity size was normal. There was mild concentric hypertrophy. Systolic function was mildly to moderately reduced. The estimated ejection fraction was in the range of 40% to 45%. Wall motion was normal; there were no regional wall motion abnormalities. Features are consistent with a pseudonormal left ventricular filling pattern, with concomitant abnormal relaxation and increased filling pressure (grade 2 diastolic dysfunction). Doppler parameters are consistent with elevated ventricular end-diastolic filling pressure. - Mitral valve: There was mild regurgitation. - Left atrium: The atrium was mildly dilated. - Right atrium: The atrium was normal in size. - Tricuspid valve: There was moderate regurgitation. - Pulmonary arteries: Systolic pressure was within the normal range. PA peak pressure: 31 mm Hg (S). - Pericardium, extracardiac: There was no pericardial effusion. Impressions: - There is mild to moderate LV dysfunction with regional wall motion abnormalities as described above.    Cardiac catheterization 10/30/2014 HEMODYNAMICS:   AO SYSTOLIC/AO  DIASTOLIC: 540/98  LV SYSTOLIC/LV DIASTOLIC: 119/14  ANGIOGRAPHIC RESULTS:   1. Left main;  normal  2. LAD; occluded proximally. This was the infarct related vessel 3. Left circumflex; dominant with an 80% fairly focal proximal AV groove stenosis and 60% mid.  4. Right coronary artery; nondominant with a 90% mid stenosis 5. Left ventriculography; was not performed  IMPRESSION:Pedro Pope has an occluded proximal LAD and it is having an anterolateral STEMI. Will proceed with PCI and stenting using Brilenta, Angiomax, and drug eluding stent. Overall impression: successful direct intervention of an occluded proximal LAD with drug-eluting stent, Angiomax and double antiplatelet therapy using Brilenta. An LV gram was not performed because the elevated LVEDP. 2-D echo will be obtained. Patient will be treated with standard post-MI medications including a beta blocker, statin drug. Medical therapy will be recommended for the residual circumflex disease at this time. The radial sheath was removed and a TR band was placed on the right breast to achieve patent hemostasis. The patient left the lab in stable condition.            Assessment/Plan: 1. Ischemic CM - looks well compensated. Continue to restrict salt. On low dose beta blocker and ACE. BP a little on the softer side - would hold on increase in his medicines for now. Start increasing his activity. Would hold on returning back to work for another week or so. Consider referral to rehab on return OV as well. Will see if we can get him back sooner for recheck.   2. Recent STEMI with PCI - on DAPT - no recurrent chest pain.   3. LV dysfunction - will need repeat echo in 3 months  4. Possible Dressler's - improving clinically  5. PAF -  Remains in sinus - will place event monitor next week to assess for recurrence/burden.  6. Elevated LFTs - will need to follow.   7. HLD  Current medicines are reviewed with the patient today.  The  patient does not have concerns regarding medicines other than what has been noted above.  The following changes have been made:  See above.  Labs/ tests ordered today include:    Orders Placed This Encounter  Procedures  . EKG 12-Lead  . Cardiac event monitor     Disposition:   FU with Dr. Gwenlyn Found as planned in April   Patient is agreeable to this plan and will call if any problems develop in the interim.   Signed: Burtis Junes, RN, ANP-C 11/08/2014 1:22 PM  [facility] [address] [place],   [address] Phone: [phone] Fax: [phone]

## 2014-11-11 ENCOUNTER — Telehealth: Payer: Self-pay | Admitting: Physician Assistant

## 2014-11-11 NOTE — Telephone Encounter (Signed)
Entered in error

## 2014-11-11 NOTE — Telephone Encounter (Signed)
    Noticed that his HR is in the 30s on BP machine. He also feels a little dizzy.  I advised him to stop his lopressor 12.5mg  BID and call the office on Monday to make an appointment. He is supposed to pick up a heart monitor on Wednesday.  He knows to come into the ED if he feels like he is going to pass out or he does not feel better.  Angelena Form PA-C  [facility]

## 2014-11-13 ENCOUNTER — Telehealth: Payer: Self-pay | Admitting: Nurse Practitioner

## 2014-11-13 NOTE — Telephone Encounter (Signed)
New Message    Pt c/o medication issue:  1. Name of Gueydan  2. How are you currently taking this medication (dosage and times per day)? 25mg /1/2 tablet by mouth twice daily   3. Are you having a reaction (difficulty breathing--STAT)? No   4. What is your medication issue? Beta Blocker

## 2014-11-13 NOTE — Telephone Encounter (Signed)
Received message from Pedro Merle, NP requesting monitor appt be moved to an earlier date if possible.  Currently scheduled for November 15, 2014.  I reviewed with Pedro Pope in monitor room and no earlier appts available.  I reviewed with Pedro Pope and pt should keep scheduled appt. He should go to ED if he develops symptoms.  I spoke with pt and gave him this information.  He has stopped metoprolol. Blood pressure now 127/56 and heart rate 62-66.  Pt aware he should continue to hold metoprolol.

## 2014-11-15 ENCOUNTER — Encounter: Payer: Self-pay | Admitting: *Deleted

## 2014-11-15 ENCOUNTER — Encounter (INDEPENDENT_AMBULATORY_CARE_PROVIDER_SITE_OTHER): Payer: Medicare Other

## 2014-11-15 DIAGNOSIS — I255 Ischemic cardiomyopathy: Secondary | ICD-10-CM

## 2014-11-15 DIAGNOSIS — I480 Paroxysmal atrial fibrillation: Secondary | ICD-10-CM

## 2014-11-15 DIAGNOSIS — I213 ST elevation (STEMI) myocardial infarction of unspecified site: Secondary | ICD-10-CM

## 2014-11-15 DIAGNOSIS — R42 Dizziness and giddiness: Secondary | ICD-10-CM

## 2014-11-15 DIAGNOSIS — R001 Bradycardia, unspecified: Secondary | ICD-10-CM

## 2014-11-15 NOTE — Progress Notes (Signed)
Patient ID: Pedro Pope, male   DOB: [birth year], 74 y.o.   MRN: [id number] [employer] verite 30 day cardiac event monitor applied to patient.

## 2014-11-28 DIAGNOSIS — I213 ST elevation (STEMI) myocardial infarction of unspecified site: Secondary | ICD-10-CM | POA: Diagnosis not present

## 2014-11-28 DIAGNOSIS — E781 Pure hyperglyceridemia: Secondary | ICD-10-CM | POA: Diagnosis not present

## 2014-11-28 DIAGNOSIS — Z6823 Body mass index (BMI) 23.0-23.9, adult: Secondary | ICD-10-CM | POA: Diagnosis not present

## 2014-11-28 DIAGNOSIS — R030 Elevated blood-pressure reading, without diagnosis of hypertension: Secondary | ICD-10-CM | POA: Diagnosis not present

## 2014-11-28 DIAGNOSIS — R739 Hyperglycemia, unspecified: Secondary | ICD-10-CM | POA: Diagnosis not present

## 2014-11-28 DIAGNOSIS — I4891 Unspecified atrial fibrillation: Secondary | ICD-10-CM | POA: Diagnosis not present

## 2014-11-28 DIAGNOSIS — I2510 Atherosclerotic heart disease of native coronary artery without angina pectoris: Secondary | ICD-10-CM | POA: Diagnosis not present

## 2014-11-28 DIAGNOSIS — M109 Gout, unspecified: Secondary | ICD-10-CM | POA: Diagnosis not present

## 2014-11-28 DIAGNOSIS — R748 Abnormal levels of other serum enzymes: Secondary | ICD-10-CM | POA: Diagnosis not present

## 2014-11-28 DIAGNOSIS — Z9861 Coronary angioplasty status: Secondary | ICD-10-CM | POA: Diagnosis not present

## 2014-11-28 DIAGNOSIS — I252 Old myocardial infarction: Secondary | ICD-10-CM | POA: Diagnosis not present

## 2014-11-28 DIAGNOSIS — I255 Ischemic cardiomyopathy: Secondary | ICD-10-CM | POA: Diagnosis not present

## 2014-12-06 ENCOUNTER — Encounter: Payer: Self-pay | Admitting: Cardiovascular Disease

## 2014-12-06 ENCOUNTER — Ambulatory Visit (INDEPENDENT_AMBULATORY_CARE_PROVIDER_SITE_OTHER): Payer: Medicare Other | Admitting: Cardiovascular Disease

## 2014-12-06 VITALS — BP 110/56 | HR 59 | Ht 68.5 in | Wt 154.9 lb

## 2014-12-06 DIAGNOSIS — I2510 Atherosclerotic heart disease of native coronary artery without angina pectoris: Secondary | ICD-10-CM

## 2014-12-06 DIAGNOSIS — Z79899 Other long term (current) drug therapy: Secondary | ICD-10-CM

## 2014-12-06 DIAGNOSIS — I2109 ST elevation (STEMI) myocardial infarction involving other coronary artery of anterior wall: Secondary | ICD-10-CM

## 2014-12-06 DIAGNOSIS — I480 Paroxysmal atrial fibrillation: Secondary | ICD-10-CM | POA: Diagnosis not present

## 2014-12-06 DIAGNOSIS — I255 Ischemic cardiomyopathy: Secondary | ICD-10-CM

## 2014-12-06 DIAGNOSIS — E785 Hyperlipidemia, unspecified: Secondary | ICD-10-CM

## 2014-12-06 DIAGNOSIS — I241 Dressler's syndrome: Secondary | ICD-10-CM | POA: Diagnosis not present

## 2014-12-06 NOTE — Assessment & Plan Note (Signed)
5 weeks status post anterior wall Monocryl function status post direct intervention of proximal LAD with a drug-eluting stent. There was mild narrowing and haziness at the true ostium of the LAD however the disease segment was covered by the stent. His beta blocker was discontinued because of bradycardia. He does have moderate LV dysfunction but other than fatigue is fairly symptomatically from this. We will recheck a 2-D echo in 6-8 weeks.

## 2014-12-06 NOTE — Assessment & Plan Note (Signed)
The patient is on atorvastatin 40 mg a day. His lipid profile during the hospitalization revealed total cholesterol 158 with an LDL of 100 and HDL of 29. We will recheck this

## 2014-12-06 NOTE — Assessment & Plan Note (Signed)
History of Dressler syndrome post antral myocardial infarction . The patient was begun on colchicine. He no longer has positional chest pain nor can I hear a rub. His 2-D echo performed on 11/04/14 did not show pericardial effusion. I've instructed him to continue colchicine for 4 more weeks and then to discontinue it

## 2014-12-06 NOTE — Progress Notes (Signed)
12/06/2014 Pedro Pope   March 28, 1941  [id number]  Primary Physician Precious Reel, MD Primary Cardiologist: Lorretta Harp MD [name]   HPI:  Pedro Pope is a delightful 74 year old thin appearing very Caucasian male father of 20, grandfather 7 grandchildren and great grandfather of 6 gray, children is a patient of [name]'s. 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 He basically had no risk factors prior to his presentation. He had an acute anterolateral infarction. By direct intervention by myself 10/30/14 with a drug-eluting stent in his proximal LAD. He had a left dominant system and had moderate proximal AV groove circumflex disease with high-grade nondominant RCA disease. Ejection fraction was in the 40-45% range with moderate anteroapical hypokinesia. He was discharged home 2 days after admission and was admitted several days later with A. Fib with RVR which she was symptomatic. His EKG showed changes and he has converted to sinus rhythm. Consideration was given to oral anticoagulation over the risks of bleeding were thought to outweigh the benefits. He is wearing an event monitor and has noticed several episodes of brief PAF.   Current Outpatient Prescriptions  Medication Sig Dispense Refill  . Ascorbic Acid (VITAMIN C) 500 MG tablet Take 500 mg by mouth daily.      Marland Kitchen aspirin 81 MG chewable tablet Chew 1 tablet (81 mg total) by mouth daily.    Marland Kitchen atorvastatin (LIPITOR) 40 MG tablet Take 1 tablet (40 mg total) by mouth daily at 6 PM. 30 tablet 11  . colchicine 0.6 MG tablet Take 1 tablet (0.6 mg total) by mouth daily. 30 tablet 3  . diltiazem (CARDIZEM CD) 180 MG 24 hr capsule Take 1 capsule (180 mg total) by mouth daily. 30 capsule 6  . lisinopril (PRINIVIL,ZESTRIL) 2.5 MG tablet Take 1 tablet (2.5 mg total) by mouth daily. Taken at night 30 tablet 5  . nitroGLYCERIN (NITROSTAT) 0.4 MG SL tablet Place 1 tablet (0.4 mg total) under  the tongue every 5 (five) minutes as needed for chest pain. 25 tablet 5  . ticagrelor (BRILINTA) 90 MG TABS tablet Take 1 tablet (90 mg total) by mouth 2 (two) times daily. 180 tablet 3   No current facility-administered medications for this visit.    Allergies  Allergen Reactions  . Penicillins Anaphylaxis  . Corn Syrup [Glucose] Other (See Comments)    High fructose corn syrup causes gout  . Aspirin Adult Low [Aspirin] Itching and Rash    Dr. [name] suspects that long term caused a rash, short term use appears to be ok    History   Social History  . Marital Status: Married    Spouse Name: N/A  . Number of Children: N/A  . Years of Education: N/A   Occupational History  . Not on file.   Social History Main Topics  . Smoking status: Former Smoker    Quit date: 08/18/1992  . Smokeless tobacco: Not on file  . Alcohol Use: Yes     Comment: social  . Drug Use: No  . Sexual Activity: Not on file   Other Topics Concern  . Not on file   Social History Narrative     Review of Systems: General: negative for chills, fever, night sweats or weight changes.  Cardiovascular: negative for chest pain, dyspnea on exertion, edema, orthopnea, palpitations, paroxysmal nocturnal dyspnea or shortness of breath Dermatological: negative for rash Respiratory: negative for cough or wheezing Urologic: negative for hematuria  Abdominal: negative for nausea, vomiting, diarrhea, bright red blood per rectum, melena, or hematemesis Neurologic: negative for visual changes, syncope, or dizziness All other systems reviewed and are otherwise negative except as noted above.    Blood pressure 110/56, pulse 59, height 5' 8.5" (1.74 m), weight 154 lb 14.4 oz (70.262 kg).  General appearance: alert and no distress Neck: no adenopathy, no carotid bruit, no JVD, supple, symmetrical, trachea midline and thyroid not enlarged, symmetric, no tenderness/mass/nodules Lungs: clear to auscultation  bilaterally Heart: regular rate and rhythm, S1, S2 normal, no murmur, click, rub or gallop Extremities: extremities normal, atraumatic, no cyanosis or edema  EKG sinus bradycardia 59 with QS pattern in leads V1 through V4 and deep anterolateral T wave inversion consistent with an evolving anterior infarct pattern. I personally reviewed this EKG  ASSESSMENT AND PLAN:   ST elevation myocardial infarction (STEMI) involving other coronary artery of anterior wall - STEMI s/p DES to proximal LAD; residual 80 % proximal and 60% mid LCx diease Pedro Pope is status post direct intervention of his proximal LAD by myself 10/30/14. I placed a 3 mm x 23 [employer] long[employer] science drug-eluting stent in his proximal LAD and postdilated with a 3.25 mm balloon up to 3.36 mm. He had a left dominant system with a 60-70% concentric proximal AV groove circumflex and a high-grade nondominant RCA. His LV function was moderately reduced with an EF in the 40-45% range with anterior wall motion abnormality. He was on appropriate medications including Toprol and therapy, ACE inhibitor, statin drug and beta blocker. He was readmitted with A. Fib with RVR and associated symptoms elsewhere converted to sinus rhythm with going in and out of PAF. Because of bradycardia and being on a calcium channel blocker his beta blocker was discontinued. 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 His chest pain has resolved over time. His EKG shows evolving changes of an anterior MI with QS pattern in leads V1 through V4 and deep anterolateral lateral T-wave inversions with small inferior Q waves.   PAF (paroxysmal atrial fibrillation) The patient was admitted several days after discharge with A. Fib with RVR, EKG changes and symptoms. He also was spontaneous and converted to sinus rhythm. His CHA2DS-VASC2 score was 2. Consideration was given to initiation of oral anticoagulation however given the fact that he had "Dressler syndrome and was already  on dual antibiotic therapy including Brilenta, Dr. Burt Knack, the discharging cardiologist, decided to get an event monitor and not initiate oral coagulation assuming that the A. Fib was related to his recent MI. I concur with his analysis that the bleeding risk outweighed the stroke prophylaxis benefit. He remains in sinus rhythm with a controlled ventricular response on Cardizem. He is wearing an event monitor and has noticed episodes of brief PAF.   Ischemic cardiomyopathy 5 weeks status post anterior wall Monocryl function status post direct intervention of proximal LAD with a drug-eluting stent. There was mild narrowing and haziness at the true ostium of the LAD however the disease segment was covered by the stent. His beta blocker was discontinued because of bradycardia. He does have moderate LV dysfunction but other than fatigue is fairly symptomatically from this. We will recheck a 2-D echo in 6-8 weeks.   Hyperlipidemia The patient is on atorvastatin 40 mg a day. His lipid profile during the hospitalization revealed total cholesterol 158 with an LDL of 100 and HDL of 29. We will recheck this   Dressler's syndrome History of Dressler syndrome post antral  myocardial infarction . The patient was begun on colchicine. He no longer has positional chest pain nor can I hear a rub. His 2-D echo performed on 11/04/14 did not show pericardial effusion. I've instructed him to continue colchicine for 4 more weeks and then to discontinue it       Lorretta Harp MD [facility], [facility] 12/06/2014 10:59 AM

## 2014-12-06 NOTE — Assessment & Plan Note (Signed)
The patient was admitted several days after discharge with A. Fib with RVR, EKG changes and symptoms. He also was spontaneous and converted to sinus rhythm. His CHA2DS-VASC2 score was 2. Consideration was given to initiation of oral anticoagulation however given the fact that he had "Dressler syndrome and was already on dual antibiotic therapy including Brilenta, Dr. Burt Knack, the discharging cardiologist, decided to get an event monitor and not initiate oral coagulation assuming that the A. Fib was related to his recent MI. I concur with his analysis that the bleeding risk outweighed the stroke prophylaxis benefit. He remains in sinus rhythm with a controlled ventricular response on Cardizem. He is wearing an event monitor and has noticed episodes of brief PAF.

## 2014-12-06 NOTE — Patient Instructions (Signed)
  We will see you back in follow up in 3 months with Dr Gwenlyn Found.   Dr Gwenlyn Found has ordered: 1. Lexiscan Myoview (to be done in 6 weeks)- this is a test that looks at the blood flow to your heart muscle.  It takes approximately 2 1/2 hours. Please follow instruction sheet, as given.  2. A FASTING lipid profile: to be done in 6 weeks.  There is a Psychologist, forensic lab on the first floor of this building, [address].  They are open from 8am-5pm with a lunch from 12-2.  You do not need an appointment.

## 2014-12-06 NOTE — Assessment & Plan Note (Signed)
Pedro Pope is status post direct intervention of his proximal LAD by myself 10/30/14. I placed a 3 mm x 23 [employer] long[employer] science drug-eluting stent in his proximal LAD and postdilated with a 3.25 mm balloon up to 3.36 mm. He had a left dominant system with a 60-70% concentric proximal AV groove circumflex and a high-grade nondominant RCA. His LV function was moderately reduced with an EF in the 40-45% range with anterior wall motion abnormality. He was on appropriate medications including Toprol and therapy, ACE inhibitor, statin drug and beta blocker. He was readmitted with A. Fib with RVR and associated symptoms elsewhere converted to sinus rhythm with going in and out of PAF. Because of bradycardia and being on a calcium channel blocker his beta blocker was discontinued. 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 His chest pain has resolved over time. His EKG shows evolving changes of an anterior MI with QS pattern in leads V1 through V4 and deep anterolateral lateral T-wave inversions with small inferior Q waves.

## 2014-12-07 ENCOUNTER — Telehealth: Payer: Self-pay | Admitting: Cardiovascular Disease

## 2014-12-07 NOTE — Telephone Encounter (Signed)
Advised recheck of lipids w/ hep function in 6 weeks. Pt voiced understanding.

## 2014-12-07 NOTE — Telephone Encounter (Signed)
Pt scheduled to have a lipid test,he just had one recently at his other doctor. He wants to make sure it is not a conflict,please call.

## 2014-12-14 ENCOUNTER — Telehealth: Payer: Self-pay | Admitting: Cardiovascular Disease

## 2014-12-15 NOTE — Telephone Encounter (Signed)
Dr Gwenlyn Found contacted [name]

## 2014-12-28 ENCOUNTER — Encounter (HOSPITAL_COMMUNITY)
Admission: RE | Admit: 2014-12-28 | Discharge: 2014-12-28 | Disposition: A | Discharge status: Home / Self Care | Payer: Medicare Other | Source: Ambulatory Visit | Attending: Cardiovascular Disease | Admitting: Cardiovascular Disease

## 2014-12-28 DIAGNOSIS — Z48812 Encounter for surgical aftercare following surgery on the circulatory system: Secondary | ICD-10-CM | POA: Insufficient documentation

## 2014-12-28 DIAGNOSIS — Z955 Presence of coronary angioplasty implant and graft: Secondary | ICD-10-CM | POA: Insufficient documentation

## 2014-12-28 DIAGNOSIS — I252 Old myocardial infarction: Secondary | ICD-10-CM | POA: Insufficient documentation

## 2015-01-01 ENCOUNTER — Encounter (HOSPITAL_COMMUNITY)
Admission: RE | Admit: 2015-01-01 | Discharge: 2015-01-01 | Disposition: A | Discharge status: Home / Self Care | Payer: Medicare Other | Source: Ambulatory Visit | Attending: Cardiovascular Disease | Admitting: Cardiovascular Disease

## 2015-01-01 DIAGNOSIS — I252 Old myocardial infarction: Secondary | ICD-10-CM | POA: Diagnosis not present

## 2015-01-01 DIAGNOSIS — Z955 Presence of coronary angioplasty implant and graft: Secondary | ICD-10-CM | POA: Diagnosis not present

## 2015-01-01 DIAGNOSIS — Z48812 Encounter for surgical aftercare following surgery on the circulatory system: Secondary | ICD-10-CM | POA: Diagnosis not present

## 2015-01-01 NOTE — Progress Notes (Addendum)
Pt started cardiac rehab today.  Pt tolerated light exercise without complaints.until on the nustep Pedro Pope mentioned having a twinge of pain in his left chest that was brief.. VSS, telemetry rhythm Sinus with intermittent PVC's PVC's are present on the patient's 12 lead ECG. , asymptomatic.  Medication list reconciled.  Pt verbalized compliance with medications and denies barriers to compliance. PSYCHOSOCIAL ASSESSMENT:  PHQ-0. Pt exhibits positive coping skills, hopeful outlook with supportive family. No psychosocial needs identified at this time, no psychosocial interventions necessary.   Pt cardiac rehab  goal is  to get his energy back and to try to get back into rhythm.  Pt encouraged to participate in functional fitness and exercising on your own to increase ability to achieve these goals.   Pt long term cardiac rehab goal is gain weight. Pedro Pope reports that he lost 20 pounds after his hospitalization,  Pt oriented to exercise equipment and routine.  Understanding verbalized. Pedro Pope reported that experienced some chest pain on Saturday night that lasted about a minute on Saturday night. Pedro Pope said the pain went across the front of his  Chest and went away without the patient needing to take any sublingual nitroglycerin.  Pedro Pope PAC called and notified. No new order received.  Pedro Pope said Pedro Pope is okay to exercise at cardiac rehab.Will continue to monitor the patient throughout  the program. Will fax exercise flow sheets to Dr. Kennon Holter office for review with today's ECG tracings.

## 2015-01-02 ENCOUNTER — Telehealth (HOSPITAL_COMMUNITY): Payer: Self-pay | Admitting: Internal Medicine

## 2015-01-03 ENCOUNTER — Encounter (HOSPITAL_COMMUNITY)
Admission: RE | Admit: 2015-01-03 | Discharge: 2015-01-03 | Disposition: A | Discharge status: Home / Self Care | Payer: Medicare Other | Source: Ambulatory Visit | Attending: Cardiovascular Disease | Admitting: Cardiovascular Disease

## 2015-01-03 DIAGNOSIS — Z48812 Encounter for surgical aftercare following surgery on the circulatory system: Secondary | ICD-10-CM | POA: Diagnosis not present

## 2015-01-03 DIAGNOSIS — Z955 Presence of coronary angioplasty implant and graft: Secondary | ICD-10-CM | POA: Diagnosis not present

## 2015-01-03 DIAGNOSIS — I252 Old myocardial infarction: Secondary | ICD-10-CM | POA: Diagnosis not present

## 2015-01-05 ENCOUNTER — Encounter (HOSPITAL_COMMUNITY)
Admission: RE | Admit: 2015-01-05 | Discharge: 2015-01-05 | Disposition: A | Discharge status: Home / Self Care | Payer: Medicare Other | Source: Ambulatory Visit | Attending: Cardiovascular Disease | Admitting: Cardiovascular Disease

## 2015-01-05 DIAGNOSIS — Z955 Presence of coronary angioplasty implant and graft: Secondary | ICD-10-CM | POA: Diagnosis not present

## 2015-01-05 DIAGNOSIS — I252 Old myocardial infarction: Secondary | ICD-10-CM | POA: Diagnosis not present

## 2015-01-05 DIAGNOSIS — Z48812 Encounter for surgical aftercare following surgery on the circulatory system: Secondary | ICD-10-CM | POA: Diagnosis not present

## 2015-01-05 NOTE — Progress Notes (Signed)
Hilario Quayle 74 y.o. male Nutrition Note Vitals - 1 value per visit 12/28/2014 12/06/2014 11/08/2014 11/06/2014  Weight (lb) 150.13 154.9 168 170   Vitals - 1 value per visit 11/03/2014 11/01/2014 10/30/2014 01/06/2011  Weight (lb)   173.28 177.8   Spoke with pt and pt's wife. Pt concerned about wt loss over the past year and how to gain wt on a "no sodium, no fat diet." Pt reports consuming "way less than 1000 mg of sodium/d and avoiding all fat." Pt and pt wife educated re: low sodium, heart healthy diet, which includes 30% total fat and < 7% saturated fat. Pt given written nutrition Rx for calories, total fat, and saturated fat for wt gain and a handout for "low sodium diet." Pt and pt wife expressed understanding of the information reviewed. Continue client-centered nutrition education by RD as part of interdisciplinary care.  Monitor and evaluate progress toward nutrition goal with team.  Derek Mound, M.Ed, RD, LDN, CDE 01/29/2015 12:07 PM

## 2015-01-05 NOTE — Progress Notes (Signed)
QUALITY OF LIFE SCORE REVIEW Patient score low in area of health and function with a score of 19.33  Scores less than 21 are considered low.  Patient quality of life slightly altered by physical constraints which limits ability to perform as prior to recent cardiac illness.  Pt with long standing family history of heart disease.  Although he is aware of the strong family history, his MI took him by surprise.  He is now able to deal with the diagnosis and has made many changes particular low fat and low salt.  Pt talked to the dietician about his concerns regarding his intentional weight loss.  Offered emotional support and reassurance.  Will continue to monitor and intervene as necessary. Cherre Huger, BSN

## 2015-01-08 ENCOUNTER — Encounter (HOSPITAL_COMMUNITY)
Admission: RE | Admit: 2015-01-08 | Discharge: 2015-01-08 | Disposition: A | Discharge status: Home / Self Care | Payer: Medicare Other | Source: Ambulatory Visit | Attending: Cardiovascular Disease | Admitting: Cardiovascular Disease

## 2015-01-08 DIAGNOSIS — Z48812 Encounter for surgical aftercare following surgery on the circulatory system: Secondary | ICD-10-CM | POA: Diagnosis not present

## 2015-01-08 DIAGNOSIS — I252 Old myocardial infarction: Secondary | ICD-10-CM | POA: Diagnosis not present

## 2015-01-08 DIAGNOSIS — Z955 Presence of coronary angioplasty implant and graft: Secondary | ICD-10-CM | POA: Diagnosis not present

## 2015-01-08 NOTE — Progress Notes (Signed)
Reviewed home exercise with pt today.  Pt plans to walk at home for exercise.  Reviewed THR, pulse, RPE, sign and symptoms, NTG use, and when to call 911 or MD.  Pt voiced understanding. Alberteen Sam, MA, ACSM RCEP

## 2015-01-10 ENCOUNTER — Encounter (HOSPITAL_COMMUNITY)
Admission: RE | Admit: 2015-01-10 | Discharge: 2015-01-10 | Disposition: A | Discharge status: Home / Self Care | Payer: Medicare Other | Source: Ambulatory Visit | Attending: Cardiovascular Disease | Admitting: Cardiovascular Disease

## 2015-01-10 DIAGNOSIS — Z955 Presence of coronary angioplasty implant and graft: Secondary | ICD-10-CM | POA: Diagnosis not present

## 2015-01-10 DIAGNOSIS — I252 Old myocardial infarction: Secondary | ICD-10-CM | POA: Diagnosis not present

## 2015-01-10 DIAGNOSIS — Z48812 Encounter for surgical aftercare following surgery on the circulatory system: Secondary | ICD-10-CM | POA: Diagnosis not present

## 2015-01-12 ENCOUNTER — Encounter (HOSPITAL_COMMUNITY)
Admission: RE | Admit: 2015-01-12 | Discharge: 2015-01-12 | Disposition: A | Discharge status: Home / Self Care | Payer: Medicare Other | Source: Ambulatory Visit | Attending: Cardiovascular Disease | Admitting: Cardiovascular Disease

## 2015-01-12 ENCOUNTER — Telehealth (HOSPITAL_COMMUNITY): Payer: Self-pay

## 2015-01-12 DIAGNOSIS — Z955 Presence of coronary angioplasty implant and graft: Secondary | ICD-10-CM | POA: Diagnosis not present

## 2015-01-12 DIAGNOSIS — I252 Old myocardial infarction: Secondary | ICD-10-CM | POA: Diagnosis not present

## 2015-01-12 DIAGNOSIS — Z48812 Encounter for surgical aftercare following surgery on the circulatory system: Secondary | ICD-10-CM | POA: Diagnosis not present

## 2015-01-12 NOTE — Telephone Encounter (Signed)
Encounter complete. 

## 2015-01-16 ENCOUNTER — Telehealth (HOSPITAL_COMMUNITY): Payer: Self-pay

## 2015-01-16 ENCOUNTER — Other Ambulatory Visit: Payer: Self-pay | Admitting: *Deleted

## 2015-01-16 ENCOUNTER — Telehealth (HOSPITAL_COMMUNITY): Payer: Self-pay | Admitting: *Deleted

## 2015-01-16 DIAGNOSIS — E785 Hyperlipidemia, unspecified: Secondary | ICD-10-CM | POA: Diagnosis not present

## 2015-01-16 DIAGNOSIS — Z79899 Other long term (current) drug therapy: Secondary | ICD-10-CM | POA: Diagnosis not present

## 2015-01-16 DIAGNOSIS — I2510 Atherosclerotic heart disease of native coronary artery without angina pectoris: Secondary | ICD-10-CM

## 2015-01-16 NOTE — Telephone Encounter (Signed)
Encounter complete. 

## 2015-01-17 ENCOUNTER — Encounter (HOSPITAL_COMMUNITY): Payer: Medicare Other

## 2015-01-17 ENCOUNTER — Encounter: Payer: Self-pay | Admitting: *Deleted

## 2015-01-17 ENCOUNTER — Ambulatory Visit (HOSPITAL_COMMUNITY)
Admission: RE | Admit: 2015-01-17 | Discharge: 2015-01-17 | Disposition: A | Discharge status: Home / Self Care | Payer: Medicare Other | Source: Ambulatory Visit | Attending: Cardiology | Admitting: Cardiology

## 2015-01-17 ENCOUNTER — Ambulatory Visit (HOSPITAL_BASED_OUTPATIENT_CLINIC_OR_DEPARTMENT_OTHER)
Admission: RE | Admit: 2015-01-17 | Discharge: 2015-01-17 | Disposition: A | Discharge status: Home / Self Care | Payer: Medicare Other | Source: Ambulatory Visit | Attending: Cardiology | Admitting: Cardiology

## 2015-01-17 DIAGNOSIS — I2510 Atherosclerotic heart disease of native coronary artery without angina pectoris: Secondary | ICD-10-CM | POA: Insufficient documentation

## 2015-01-17 LAB — MYOCARDIAL PERFUSION IMAGING
CHL CUP NUCLEAR SDS: 3
CHL CUP NUCLEAR SSS: 25
CHL CUP STRESS STAGE 1 DBP: 62 mmHg
CHL CUP STRESS STAGE 1 SPEED: 0 mph
CHL CUP STRESS STAGE 2 GRADE: 0 %
CHL CUP STRESS STAGE 2 HR: 58 {beats}/min
CHL CUP STRESS STAGE 2 SPEED: 0 mph
CHL CUP STRESS STAGE 3 GRADE: 0 %
CHL CUP STRESS STAGE 3 HR: 68 {beats}/min
CHL CUP STRESS STAGE 3 SPEED: 0 mph
CHL CUP STRESS STAGE 4 DBP: 61 mmHg
CSEPPBP: 110 mmHg
CSEPPMHR: 46 %
Estimated workload: 1 METS
Peak HR: 68 {beats}/min
Rest HR: 57 {beats}/min
SRS: 22
Stage 1 Grade: 0 %
Stage 1 HR: 61 {beats}/min
Stage 1 SBP: 117 mmHg
Stage 3 DBP: 59 mmHg
Stage 3 SBP: 110 mmHg
Stage 4 Grade: 0 %
Stage 4 HR: 68 {beats}/min
Stage 4 SBP: 103 mmHg
Stage 4 Speed: 0 mph
TID: 1.04

## 2015-01-17 LAB — LIPID PANEL
CHOLESTEROL: 74 mg/dL (ref 0–200)
HDL: 31 mg/dL — ABNORMAL LOW (ref >=40)
LDL Cholesterol: 23 mg/dL (ref 0–99)
TRIGLYCERIDES: 99 mg/dL (ref <150)
Total CHOL/HDL Ratio: 2.4 Ratio
VLDL: 20 mg/dL (ref 0–40)

## 2015-01-17 LAB — HEPATIC FUNCTION PANEL
ALK PHOS: 75 U/L (ref 39–117)
ALT: 18 U/L (ref 0–53)
AST: 21 U/L (ref 0–37)
Albumin: 3.9 g/dL (ref 3.5–5.2)
BILIRUBIN INDIRECT: 0.4 mg/dL (ref 0.2–1.2)
Bilirubin, Direct: 0.2 mg/dL (ref 0.0–0.3)
Total Bilirubin: 0.6 mg/dL (ref 0.2–1.2)
Total Protein: 6.2 g/dL (ref 6.0–8.3)

## 2015-01-17 MED ORDER — TECHNETIUM TC 99M SESTAMIBI GENERIC - CARDIOLITE
11.0000 | Freq: Once | INTRAVENOUS | Status: AC | PRN
  Administered 2015-01-17: 11 via INTRAVENOUS

## 2015-01-17 MED ORDER — AMINOPHYLLINE 25 MG/ML IV SOLN
75.0000 mg | Freq: Once | INTRAVENOUS | Status: AC
  Administered 2015-01-17: 75 mg via INTRAVENOUS

## 2015-01-17 MED ORDER — REGADENOSON 0.4 MG/5ML IV SOLN
0.4000 mg | Freq: Once | INTRAVENOUS | Status: AC
Start: 2015-01-17 — End: 2015-01-17
  Administered 2015-01-17: 0.4 mg via INTRAVENOUS

## 2015-01-17 MED ORDER — TECHNETIUM TC 99M SESTAMIBI GENERIC - CARDIOLITE
30.9000 | Freq: Once | INTRAVENOUS | Status: AC | PRN
  Administered 2015-01-17: 30.9 via INTRAVENOUS

## 2015-01-19 ENCOUNTER — Ambulatory Visit (INDEPENDENT_AMBULATORY_CARE_PROVIDER_SITE_OTHER): Payer: Medicare Other | Admitting: Cardiovascular Disease

## 2015-01-19 ENCOUNTER — Encounter: Payer: Self-pay | Admitting: Cardiovascular Disease

## 2015-01-19 ENCOUNTER — Encounter (HOSPITAL_COMMUNITY)
Admission: RE | Admit: 2015-01-19 | Discharge: 2015-01-19 | Disposition: A | Discharge status: Home / Self Care | Payer: Medicare Other | Source: Ambulatory Visit | Attending: Cardiovascular Disease | Admitting: Cardiovascular Disease

## 2015-01-19 VITALS — BP 104/50 | HR 46 | Ht 68.75 in | Wt 149.1 lb

## 2015-01-19 DIAGNOSIS — E785 Hyperlipidemia, unspecified: Secondary | ICD-10-CM | POA: Diagnosis not present

## 2015-01-19 DIAGNOSIS — I2510 Atherosclerotic heart disease of native coronary artery without angina pectoris: Secondary | ICD-10-CM

## 2015-01-19 DIAGNOSIS — I241 Dressler's syndrome: Secondary | ICD-10-CM | POA: Diagnosis not present

## 2015-01-19 DIAGNOSIS — I480 Paroxysmal atrial fibrillation: Secondary | ICD-10-CM

## 2015-01-19 DIAGNOSIS — I255 Ischemic cardiomyopathy: Secondary | ICD-10-CM | POA: Diagnosis not present

## 2015-01-19 DIAGNOSIS — Z955 Presence of coronary angioplasty implant and graft: Secondary | ICD-10-CM | POA: Insufficient documentation

## 2015-01-19 DIAGNOSIS — Z48812 Encounter for surgical aftercare following surgery on the circulatory system: Secondary | ICD-10-CM | POA: Insufficient documentation

## 2015-01-19 DIAGNOSIS — I252 Old myocardial infarction: Secondary | ICD-10-CM | POA: Diagnosis not present

## 2015-01-19 NOTE — Assessment & Plan Note (Signed)
History of recent anterior STEMI treatment to go to retract intervention with recent 2-D echo performed 01/17/15 revealing an EF of 30-35%. He has no symptoms of heart failure this represents a decline in his ejection fraction. He may benefit from ICD implantation for primary prevention which may also allow him to be put on a low-dose beta blocker.

## 2015-01-19 NOTE — Progress Notes (Signed)
01/19/2015 [name]   07-23-1941  [id number]  Primary Physician Precious Reel, MD Primary Cardiologist: Lorretta Harp MD [name]   HPI:  Mr. Delaughter is a delightful 74 year old thin appearing very Caucasian male father of 16, grandfather 7 grandchildren and great grandfather of 6 gray, children is a patient of [name]'s. I last saw him in the office 12/06/14. 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 He basically had no risk factors prior to his presentation. He had an acute anterolateral infarction. By direct intervention by myself 10/30/14 with a drug-eluting stent in his proximal LAD. He had a left dominant system and had moderate proximal AV groove circumflex disease with high-grade nondominant RCA disease. Ejection fraction was in the 40-45% range with moderate anteroapical hypokinesia. He was discharged home 2 days after admission and was admitted several days later with A. Fib with RVR which she was symptomatic. His EKG showed changes and he has converted to sinus rhythm. Consideration was given to oral anticoagulation over the risks of bleeding were thought to outweigh the benefits. He is wearing an event monitor and has noticed several episodes of brief PAF. His beta blocker was discontinued because of bradycardia and hypotension. Recent Myoview showed dense scar in the LAD territory and 2-D echo revealed an EF of 30-35%. He participates in cardiac rehabilitation and is relatively asymptomatic.   Current Outpatient Prescriptions  Medication Sig Dispense Refill  . Ascorbic Acid (VITAMIN C) 500 MG tablet Take 500 mg by mouth daily.      Marland Kitchen aspirin 81 MG chewable tablet Chew 1 tablet (81 mg total) by mouth daily.    Marland Kitchen atorvastatin (LIPITOR) 40 MG tablet Take 1 tablet (40 mg total) by mouth daily at 6 PM. 30 tablet 11  . colchicine 0.6 MG tablet Take 1 tablet (0.6 mg total) by mouth daily. 30 tablet 3  . diltiazem (CARDIZEM CD) 180  MG 24 hr capsule Take 1 capsule (180 mg total) by mouth daily. 30 capsule 6  . lisinopril (PRINIVIL,ZESTRIL) 2.5 MG tablet Take 1 tablet (2.5 mg total) by mouth daily. Taken at night 30 tablet 5  . nitroGLYCERIN (NITROSTAT) 0.4 MG SL tablet Place 1 tablet (0.4 mg total) under the tongue every 5 (five) minutes as needed for chest pain. 25 tablet 5  . ticagrelor (BRILINTA) 90 MG TABS tablet Take 1 tablet (90 mg total) by mouth 2 (two) times daily. 180 tablet 3   No current facility-administered medications for this visit.    Allergies  Allergen Reactions  . Penicillins Anaphylaxis  . Corn Syrup [Glucose] Other (See Comments)    High fructose corn syrup causes gout  . Aspirin Adult Low [Aspirin] Itching and Rash    Dr. [name] suspects that long term caused a rash, short term use appears to be ok    History   Social History  . Marital Status: Married    Spouse Name: N/A  . Number of Children: N/A  . Years of Education: N/A   Occupational History  . Not on file.   Social History Main Topics  . Smoking status: Former Smoker    Quit date: 08/18/1992  . Smokeless tobacco: Not on file  . Alcohol Use: Yes     Comment: social  . Drug Use: No  . Sexual Activity: Not on file   Other Topics Concern  . Not on file   Social History Narrative     Review of  Systems: General: negative for chills, fever, night sweats or weight changes.  Cardiovascular: negative for chest pain, dyspnea on exertion, edema, orthopnea, palpitations, paroxysmal nocturnal dyspnea or shortness of breath Dermatological: negative for rash Respiratory: negative for cough or wheezing Urologic: negative for hematuria Abdominal: negative for nausea, vomiting, diarrhea, bright red blood per rectum, melena, or hematemesis Neurologic: negative for visual changes, syncope, or dizziness All other systems reviewed and are otherwise negative except as noted above.    Blood pressure 104/50, pulse 46, height 5' 8.75"  (1.746 m), weight 149 lb 1.6 oz (67.631 kg).  General appearance: alert and no distress Neck: no adenopathy, no carotid bruit, no JVD, supple, symmetrical, trachea midline and thyroid not enlarged, symmetric, no tenderness/mass/nodules Lungs: clear to auscultation bilaterally Heart: regular rate and rhythm, S1, S2 normal, no murmur, click, rub or gallop Extremities: extremities normal, atraumatic, no cyanosis or edema  EKG not performed today  ASSESSMENT AND PLAN:   PAF (paroxysmal atrial fibrillation) Post-MI paroxysmal atrial fibrillation demonstrated during admission post MI as well as on the monitoring period. His CHA2DSVASC2 score is 2. He was not placed on oral anticoagulation  because of his concomitant dual antiplatelets therapy after his recent anterior STEMI 3 months ago. Beta blocker was stopped because of cardiac hypotension. We will need to rethink the possibility of changing oil into to either Effient or her Plavix and beginning a novel or oral anticoagulate for stroke prophylaxis.   Ischemic cardiomyopathy History of recent anterior STEMI treatment to go to retract intervention with recent 2-D echo performed 01/17/15 revealing an EF of 30-35%. He has no symptoms of heart failure this represents a decline in his ejection fraction. He may benefit from ICD implantation for primary prevention which may also allow him to be put on a low-dose beta blocker.   Hyperlipidemia History of hyperlipidemia on atorvastatin 40 mg a day with recent lipid profile performed 01/16/15 revealed a total cholesterol 74, LDL 23 and HDL of 31.   CAD (coronary artery disease), native coronary artery History of CAD status post inferior STEMI 10/29/13 with implantation of a drug-eluting stent in the proximal LAD. He'll left dominant system with residual disease in the circumflex coronary artery. Recent Myoview showed dense scar in the LAD territory without evidence of ischemia. He denies chest pain or  shortness of breath. He remains on a type of therapy with Brilenta.       Lorretta Harp MD FACP,FACC,FAHA, [facility] 01/19/2015 9:41 AM

## 2015-01-19 NOTE — Assessment & Plan Note (Signed)
Post-MI paroxysmal atrial fibrillation demonstrated during admission post MI as well as on the monitoring period. His CHA2DSVASC2 score is 2. He was not placed on oral anticoagulation  because of his concomitant dual antiplatelets therapy after his recent anterior STEMI 3 months ago. Beta blocker was stopped because of cardiac hypotension. We will need to rethink the possibility of changing oil into to either Effient or her Plavix and beginning a novel or oral anticoagulate for stroke prophylaxis.

## 2015-01-19 NOTE — Assessment & Plan Note (Addendum)
History of hyperlipidemia on atorvastatin 40 mg a day with recent lipid profile performed 01/16/15 revealed a total cholesterol 74, LDL 23 and HDL of 31.

## 2015-01-19 NOTE — Assessment & Plan Note (Signed)
History of CAD status post inferior STEMI 10/29/13 with implantation of a drug-eluting stent in the proximal LAD. He'll left dominant system with residual disease in the circumflex coronary artery. Recent Myoview showed dense scar in the LAD territory without evidence of ischemia. He denies chest pain or shortness of breath. He remains on a type of therapy with Brilenta.

## 2015-01-19 NOTE — Patient Instructions (Signed)
Dr Gwenlyn Found has referred you to Dr Rayann Heman, the electrophysiologist at our [facility] location.   Dr Gwenlyn Found will see you back in 3 months.

## 2015-01-22 ENCOUNTER — Encounter (HOSPITAL_COMMUNITY)
Admission: RE | Admit: 2015-01-22 | Discharge: 2015-01-22 | Disposition: A | Discharge status: Home / Self Care | Payer: Medicare Other | Source: Ambulatory Visit | Attending: Cardiovascular Disease | Admitting: Cardiovascular Disease

## 2015-01-22 ENCOUNTER — Telehealth: Payer: Self-pay | Admitting: Cardiovascular Disease

## 2015-01-22 ENCOUNTER — Other Ambulatory Visit: Payer: Self-pay

## 2015-01-22 DIAGNOSIS — Z48812 Encounter for surgical aftercare following surgery on the circulatory system: Secondary | ICD-10-CM | POA: Diagnosis not present

## 2015-01-22 DIAGNOSIS — I2510 Atherosclerotic heart disease of native coronary artery without angina pectoris: Secondary | ICD-10-CM

## 2015-01-22 DIAGNOSIS — I252 Old myocardial infarction: Secondary | ICD-10-CM | POA: Diagnosis not present

## 2015-01-22 DIAGNOSIS — Z955 Presence of coronary angioplasty implant and graft: Secondary | ICD-10-CM | POA: Diagnosis not present

## 2015-01-22 NOTE — Telephone Encounter (Signed)
Spoke with Carlette @ cardiac rehab. Patient is there and reported to her that he has been having "sharp, fleeting pains in chest, about 8-9x/daily". He noticed this pain while walking on the track for warm up and patient was concerned about this given his low EF.   Patient saw Dr. Gwenlyn Found Friday 6/3 and report was fine other than referral to Dr. Rayann Heman was placed - no documentation of chest discomfort being addressed in the note as a pressing issue.  Patient should be OK to exercise as he has previously, as he reports he has had these symptoms (while I suppose unreported until now) at cardiac rehab in past and Dr. Gwenlyn Found did not advise holding off on rehab.   Will route to Dr. Gwenlyn Found as Juluis Rainier

## 2015-01-22 NOTE — Progress Notes (Signed)
Pt in today for rehab today. After ambulating around the track, pt had a sharp fleeting pain.  Pt alerted rehab staff due to his concern over his worsening heart function.  BP 118/60.  Pain immediately resolves with no further intervention.Upon further evaluation pt stated that he has this sensation of pain 8-9 times a day some days little less others a bit more. Pain is not like his pain proceeding his MI which he describes as being heavy and radiates to the left arm. 12 lead ekg completed.  Called Dr. Gwenlyn Found office and spoke to gina who will relay the information to DR. Berrry who is not in the office today.  Pt remained symptom free during exercise.  Pt will see Dr/ allred on Wednesday.  Rehab report given to pt for MD review. Cherre Huger, BSN

## 2015-01-22 NOTE — Telephone Encounter (Signed)
EKG ordered per Pedro Pope's request. They cannot order 12 lead EKG

## 2015-01-22 NOTE — Addendum Note (Signed)
Addended by: Fidel Levy on: 01/22/2015 11:43 AM   Modules accepted: Orders

## 2015-01-24 ENCOUNTER — Ambulatory Visit (INDEPENDENT_AMBULATORY_CARE_PROVIDER_SITE_OTHER): Payer: Medicare Other | Admitting: Internal Medicine

## 2015-01-24 ENCOUNTER — Encounter (HOSPITAL_COMMUNITY)
Admission: RE | Admit: 2015-01-24 | Discharge: 2015-01-24 | Disposition: A | Discharge status: Home / Self Care | Payer: Medicare Other | Source: Ambulatory Visit | Attending: Cardiovascular Disease | Admitting: Cardiovascular Disease

## 2015-01-24 ENCOUNTER — Encounter: Payer: Self-pay | Admitting: Internal Medicine

## 2015-01-24 VITALS — BP 102/50 | HR 51 | Ht 68.75 in | Wt 146.6 lb

## 2015-01-24 DIAGNOSIS — Z48812 Encounter for surgical aftercare following surgery on the circulatory system: Secondary | ICD-10-CM | POA: Diagnosis not present

## 2015-01-24 DIAGNOSIS — I255 Ischemic cardiomyopathy: Secondary | ICD-10-CM

## 2015-01-24 DIAGNOSIS — I2109 ST elevation (STEMI) myocardial infarction involving other coronary artery of anterior wall: Secondary | ICD-10-CM

## 2015-01-24 DIAGNOSIS — Z955 Presence of coronary angioplasty implant and graft: Secondary | ICD-10-CM | POA: Diagnosis not present

## 2015-01-24 DIAGNOSIS — I241 Dressler's syndrome: Secondary | ICD-10-CM | POA: Diagnosis not present

## 2015-01-24 DIAGNOSIS — I252 Old myocardial infarction: Secondary | ICD-10-CM | POA: Diagnosis not present

## 2015-01-24 DIAGNOSIS — I480 Paroxysmal atrial fibrillation: Secondary | ICD-10-CM | POA: Diagnosis not present

## 2015-01-24 DIAGNOSIS — I2510 Atherosclerotic heart disease of native coronary artery without angina pectoris: Secondary | ICD-10-CM

## 2015-01-24 MED ORDER — CARVEDILOL 3.125 MG PO TABS
3.1250 mg | ORAL_TABLET | Freq: Two times a day (BID) | ORAL | Status: DC
Start: 2015-01-24 — End: 2015-06-05

## 2015-01-24 MED ORDER — LISINOPRIL 5 MG PO TABS: 5.0000 mg | ORAL_TABLET | Freq: Every day | ORAL | Status: DC

## 2015-01-24 NOTE — Patient Instructions (Addendum)
Medication Instructions:  Your physician has recommended you make the following change in your medication:  1) Stop Diltiazem 2) Start Carvedilol 3.125 mg twice daily 3) Increase Lisinopril to 5 mg daily   Labwork: None ordered  Testing/Procedures: Your physician has requested that you have an echocardiogram. Echocardiography is a painless test that uses sound waves to create images of your heart. It provides your doctor with information about the size and shape of your heart and how well your heart's chambers and valves are working. This procedure takes approximately one hour. There are no restrictions for this procedure.    Follow-Up: Your physician recommends that you schedule a follow-up appointment as scheduled with Dr Gwenlyn Found   Any Other Special Instructions Will Be Listed Below (If Applicable).

## 2015-01-24 NOTE — Progress Notes (Signed)
Electrophysiology Office Note   Date:  01/24/2015   ID:  Orie Baxendale, DOB Dec 31, 1940, MRN [id number]  PCP:  Precious Reel, MD  Cardiologist:  Dr Gwenlyn Found Primary Electrophysiologist: Thompson Grayer, MD    Chief Complaint  Patient presents with  . Congestive Heart Failure     History of Present Illness: Pedro Pope is a 74 y.o. male who presents today for electrophysiology evaluation.   He reports being in good health until 10/20/14 when he had an acute MI.  He underwent DES placement in the proximal LAD by Dr Gwenlyn Found 10/30/14.  EF was 40-45% at that time.  He was discharged and readmitted several days later with afib with RVR.  Due to antiplatelet therapy, he was not started on anticoagulation therapy.  He has not been placed on a beta blocker due to bradycardia but remains on diltiazem.Recent Myoview showed dense scar in the LAD territory and 2-D echo revealed an EF of 30-35%. He participates in cardiac rehabilitation and is relatively asymptomatic. He reports SOB with moderate activity.  He continues to improve slowly.  He has had atypical chest pains and has been placed on colchicine for dresslers syndrome.  Today, he denies symptoms of palpitations, shortness of breath, orthopnea, PND, lower extremity edema, claudication, dizziness, presyncope, syncope, bleeding, or neurologic sequela. The patient is tolerating medications without difficulties and is otherwise without complaint today.    Past Medical History  Diagnosis Date  . Prostate cancer     s/p prostatectomy and XRT  . Gout   . Rheumatic fever 1947  . Chronic headaches   . Fatty liver   . Anosmia   . ED (erectile dysfunction)   . CAD (coronary artery disease), native coronary artery     a. 10/29/2013 antlat STEMI s/p DES to pLAD; residual 80 % proximal and 60% mid LCx diease  . Dressler's syndrome     a. placed on colchicine   . Ischemic cardiomyopathy     a. 2D ECHO 11/04/14 w/ EF 40-45% with LV WMA, G2DD, mild MR, mild LA  dilation, mod TR.  Marland Kitchen PAF (paroxysmal atrial fibrillation)     a. Dx on 11/03/14 admission. Not placed on longterm AC due to DAPT w/ receent DES. Will plan for outpt heart monitor   . Pre-diabetes     a. HgA1c 6.1 in 10/2014  . Elevated TSH    Past Surgical History  Procedure Laterality Date  . Prostatectomy    . Tonsillectomy    . Coronary angioplasty with stent placement  10/30/14    STEMI s/p DES to proximal LAD; residual 80 % proximal and 60% mid LCx diease  . Left heart catheterization with coronary angiogram N/A 10/30/2014    Procedure: LEFT HEART CATHETERIZATION WITH CORONARY ANGIOGRAM;  Surgeon: Lorretta Harp, MD;  Location: [facility] CATH LAB;  Service: Cardiovascular;  Laterality: N/A;  . Percutaneous coronary stent intervention (pci-s)  10/30/2014    Procedure: PERCUTANEOUS CORONARY STENT INTERVENTION (PCI-S);  Surgeon: Lorretta Harp, MD;  Location: [facility] CATH LAB;  Service: Cardiovascular;;     Current Outpatient Prescriptions  Medication Sig Dispense Refill  . Ascorbic Acid (VITAMIN C) 500 MG tablet Take 500 mg by mouth daily.      Marland Kitchen aspirin 81 MG chewable tablet Chew 1 tablet (81 mg total) by mouth daily.    Marland Kitchen atorvastatin (LIPITOR) 40 MG tablet Take 1 tablet (40 mg total) by mouth daily at 6 PM. 30 tablet 11  . colchicine 0.6 MG tablet Take  1 tablet (0.6 mg total) by mouth daily. 30 tablet 3  . diltiazem (CARDIZEM CD) 180 MG 24 hr capsule Take 1 capsule (180 mg total) by mouth daily. 30 capsule 6  . lisinopril (PRINIVIL,ZESTRIL) 2.5 MG tablet Take 1 tablet (2.5 mg total) by mouth daily. Taken at night 30 tablet 5  . nitroGLYCERIN (NITROSTAT) 0.4 MG SL tablet Place 1 tablet (0.4 mg total) under the tongue every 5 (five) minutes as needed for chest pain. 25 tablet 5  . ticagrelor (BRILINTA) 90 MG TABS tablet Take 1 tablet (90 mg total) by mouth 2 (two) times daily. 180 tablet 3   No current facility-administered medications for this visit.    Allergies:   Penicillins; Corn syrup;  and Aspirin adult low   Social History:  The patient  reports that he quit smoking about 22 years ago. He does not have any smokeless tobacco history on file. He reports that he drinks alcohol. He reports that he does not use illicit drugs.   Family History:  The patient's  family history includes Cancer in an other family member; Coronary artery disease (age of onset: 14) in his brother; Diabetes in an other family member; Gout in an other family member; Hyperlipidemia in an other family member; Hypertension in an other family member.    ROS:  Please see the history of present illness.   All other systems are reviewed and negative.    PHYSICAL EXAM: VS:  BP 102/50 mmHg  Pulse 51  Ht 5' 8.75" (1.746 m)  Wt 66.497 kg (146 lb 9.6 oz)  BMI 21.81 kg/m2  SpO2 98% , BMI Body mass index is 21.81 kg/(m^2). GEN: Well nourished, well developed, in no acute distress HEENT: normal Neck: no JVD, carotid bruits, or masses Cardiac: RRR; no murmurs, rubs, or gallops,no edema  Respiratory:  clear to auscultation bilaterally, normal work of breathing GI: soft, nontender, nondistended, + BS MS: no deformity or atrophy Skin: warm and dry  Neuro:  Strength and sensation are intact Psych: euthymic mood, full affect  EKG:  EKG 03/24/15 reveals sinus rhythm with diffuse TWI   Recent Labs: 11/03/2014: Magnesium 2.1; TSH 5.708* 11/04/2014: BUN 28*; Creatinine, Ser 1.10; Potassium 3.7; Sodium 132* 11/06/2014: Hemoglobin 12.4*; Platelets 292 01/16/2015: ALT 18    Lipid Panel     Component Value Date/Time   CHOL 74 01/16/2015 0927   TRIG 99 01/16/2015 0927   HDL 31* 01/16/2015 0927   CHOLHDL 2.4 01/16/2015 0927   VLDL 20 01/16/2015 0927   LDLCALC 23 01/16/2015 0927     Wt Readings from Last 3 Encounters:  01/24/15 66.497 kg (146 lb 9.6 oz)  01/19/15 67.631 kg (149 lb 1.6 oz)  01/17/15 69.854 kg (154 lb)      Other studies Reviewed: Additional studies/ records that were reviewed today include:  Dr Rachel Bo notes and hospital records   ASSESSMENT AND PLAN:  1.  Ischemic CM The patient is s/p anterior MI and has an ischemic CM.  Today, we discussed ICD implantation.  At this juncture, he would prefer to continue to optimize medicines in hopes that his EF may recover. He is on an ACE inhibitor but not a beta blocker. I will stop diltiazem today and initiate coreg 3.'125mg'$  BID.  This can be further uptitrated when he sees Dr Gwenlyn Found.  I have also increased lisinopril to '5mg'$  daily today as he is not symptomatic with bradycardia Repeat echo in 3 months  2. Paroxysmal atrial fibrillation chads2vasc score is  at least 3.  He should therefore be anticoagulated long term.  He is presently on Multimedia programmer.  As he is now 3 months post PCI, I would advise that we convert Ticagralor to plavix and then initiate oral anticoagulation with a novel anticoagulant.   I would not advise concomitant use of ticagralor with [place] therapy due to bleeding risks.  I will defer this decision ultimately to Dr Gwenlyn Found.  3. dresslors syndrome He continues to have occasional chest pains He questions when he can stop colchicine I have instructed him to discuss this with Dr Gwenlyn Found Pt has follow-up with Dr Gwenlyn Found in 3 months Will repeat an echo at that time.  Current medicines are reviewed at length with the patient today.   The patient does not have concerns regarding his medicines.  The following changes were made today:  none   Signed, Thompson Grayer, MD  01/24/2015 9:09 AM     [facility] [address] [place] [address] [phone] (office) [phone] (fax)

## 2015-01-25 ENCOUNTER — Telehealth: Payer: Self-pay | Admitting: Internal Medicine

## 2015-01-25 NOTE — Telephone Encounter (Signed)
New message   Patient calling.     Pt c/o medication issue:  1. Name of Medication:carvediol 3.125 mg  2. How are you currently taking this medication (dosage and times per day)? Started new medication on yesterday  First dosage last night   3. Are you having a reaction (difficulty breathing--STAT)? No   4. What is your medication issue? Dx in 2001 with fatty liver.

## 2015-01-25 NOTE — Telephone Encounter (Signed)
Spoke with patient and he forgot to mention that was diagnosed with fatty liver in 2001.  He says the SE for Carvedilol says may worsen liver damage for people with liver disease.  He is reluctant to take until I discuss with Dr Rayann Heman.  I let him know I would forward a message to him but he is not back in the office until next week.  I also told him I would discuss with Elberta Leatherwood , Pharm D

## 2015-01-25 NOTE — Telephone Encounter (Signed)
Discussed with Gay Filler and it's okay for patient to take.  He is aware and will continue

## 2015-01-26 ENCOUNTER — Encounter (HOSPITAL_COMMUNITY)
Admission: RE | Admit: 2015-01-26 | Discharge: 2015-01-26 | Disposition: A | Discharge status: Home / Self Care | Payer: Medicare Other | Source: Ambulatory Visit | Attending: Cardiovascular Disease | Admitting: Cardiovascular Disease

## 2015-01-26 DIAGNOSIS — I252 Old myocardial infarction: Secondary | ICD-10-CM | POA: Diagnosis not present

## 2015-01-26 DIAGNOSIS — Z955 Presence of coronary angioplasty implant and graft: Secondary | ICD-10-CM | POA: Diagnosis not present

## 2015-01-26 DIAGNOSIS — Z48812 Encounter for surgical aftercare following surgery on the circulatory system: Secondary | ICD-10-CM | POA: Diagnosis not present

## 2015-01-29 ENCOUNTER — Telehealth: Payer: Self-pay | Admitting: *Deleted

## 2015-01-29 ENCOUNTER — Encounter (HOSPITAL_COMMUNITY)
Admission: RE | Admit: 2015-01-29 | Discharge: 2015-01-29 | Disposition: A | Discharge status: Home / Self Care | Payer: Medicare Other | Source: Ambulatory Visit | Attending: Cardiovascular Disease | Admitting: Cardiovascular Disease

## 2015-01-29 DIAGNOSIS — I252 Old myocardial infarction: Secondary | ICD-10-CM | POA: Diagnosis not present

## 2015-01-29 DIAGNOSIS — Z48812 Encounter for surgical aftercare following surgery on the circulatory system: Secondary | ICD-10-CM | POA: Diagnosis not present

## 2015-01-29 DIAGNOSIS — Z955 Presence of coronary angioplasty implant and graft: Secondary | ICD-10-CM | POA: Diagnosis not present

## 2015-01-29 NOTE — Progress Notes (Signed)
Low asymptomatic pre exercise bp.  96/60.  Pt given gatorade to drink and banana .  Pt breakfast this morning was light.  Pt rechecked 118/60 standing and 112/60 sitting.  Pt able to proceed with exercise with no complaints.  BP remained wnl during exercise as well as post exercise.  Continue to monitor. Cherre Huger, BSN

## 2015-01-29 NOTE — Progress Notes (Signed)
Pedro Pope 75 y.o. male Nutrition Note Spoke with pt. Nutrition Plan and Nutrition Survey goals reviewed with pt. Pt is following Step 2 of the Therapeutic Lifestyle Changes diet. Pt wants to stop losing wt/gain wt. Pt wt 146.5 lb (66.6 kg) today, which is down 3.5 lb (1.6 kg) over the past month. Pt wt is down 26.8 lb over the past 3 months (15.5% decrease). Pt has been trying to gain wt. Wt gain tips reviewed. Pt concerned about increasing his total fat intake due to "my increased risk for stroke and sudden death." Pt concerned about increasing sodium content with increased p.o. intake. Pt does not use canned/convenience foods often. Pt rarely adds salt to food. Pt eats out infrequently.  Pt educated re: role of nutrition and cardiovascular health. Pt is pre-diabetic. Pt is aware of pre-diabetes dx. Pre-diabetes discussed.  Pt expressed understanding of the information reviewed. Pt aware of nutrition education classes offered and plans on attending nutrition classes.  Lab Results  Component Value Date   HGBA1C 6.1* 10/31/2014   Vitals - 1 value per visit 01/24/2015 01/19/2015 01/17/2015 12/28/2014 12/06/2014  Weight (lb) 146.6 149.1 154 150.13 154.9   Vitals - 1 value per visit 11/08/2014 11/06/2014 11/03/2014 11/01/2014  Weight (lb) 168 170     Vitals - 1 value per visit 10/30/2014  Weight (lb) 173.28   Nutrition Diagnosis ? Food-and nutrition-related knowledge deficit related to lack of exposure to information as related to diagnosis of: ? CVD ? Pre-DM ? Recent h/o of wt loss due to decreased energy intake as evidenced by a weight loss of 26 lb over 3 months  Nutrition RX/ Estimated Daily Nutrition Needs for: wt gain  2600-3100 Kcal, 70-85 gm fat, 17-21 gm sat fat, 2.6-3.1 gm trans-fat, <1500 mg sodium Nutrition Intervention ? Pt's individual nutrition plan reviewed with pt. ? Benefits of adopting Therapeutic Lifestyle Changes discussed when Medficts reviewed. ? Pt to attend the Portion  Distortion class ? Pt to attend the   ? Nutrition I class                  ? Nutrition II class      ? Pt given handouts for: ? Pre-diabetes ? High Calorie, High Protein Nutrition Therapy ? Continue client-centered nutrition education by RD, as part of interdisciplinary care. Goal(s) ? Pt to identify food quantities necessary to achieve: ? wt gain at graduation from cardiac rehab.  Monitor and Evaluate progress toward nutrition goal with team. Nutrition Risk: Change to Moderate Derek Mound, M.Ed, RD, LDN, CDE 01/29/2015 12:08 PM

## 2015-01-29 NOTE — Addendum Note (Signed)
Encounter addended by: Jewel Baize, RD on: 01/29/2015 12:07 PM<BR>     Documentation filed: Notes Section

## 2015-01-29 NOTE — Telephone Encounter (Signed)
I spoke with patient and made an appt for patient to see Erasmo Downer for changing Brilinta to Plavix and adding a NOAC.

## 2015-01-29 NOTE — Telephone Encounter (Signed)
Returning your call,closed your encounter by mistake.

## 2015-01-29 NOTE — Telephone Encounter (Signed)
-----   Message from Lorretta Harp, MD sent at 01/27/2015  4:12 PM EDT ----- Erasmo Downer please ----- Message -----    From: Chauncy Lean, RN    Sent: 01/26/2015   3:04 PM      To: Lorretta Harp, MD  Would you like for me to bring the patient in to see you or Erasmo Downer to discuss the med change from Brilinta to plavix and addition of Noac?   ----- Message -----    From: Lorretta Harp, MD    Sent: 01/26/2015   4:53 AM      To: Thompson Grayer, MD, Chauncy Lean, RN  I'm OK to do both of those things Jeneen Rinks.  JJB ----- Message -----    From: Thompson Grayer, MD    Sent: 01/25/2015  10:23 PM      To: Lorretta Harp, MD  Happy to have seen Mr [place]... Very nice guy  If you are ok with switching brilinta to plavix then we could add a NOAC at this time. ? Do you think he could stop colchicine--> on it for dresslors  Will add beta blocker and repeat EF in 3 months.  Given myoview findings, I am doubtful that EF will recover.  If repeat EF is <35% when you see him in 3 months, please let me know.  Thanks!  JA

## 2015-01-30 ENCOUNTER — Encounter: Payer: Self-pay | Admitting: Pharmacist Clinician (PhC)/ Clinical Pharmacy Specialist

## 2015-01-30 ENCOUNTER — Ambulatory Visit (INDEPENDENT_AMBULATORY_CARE_PROVIDER_SITE_OTHER): Payer: Medicare Other | Admitting: Pharmacist Clinician (PhC)/ Clinical Pharmacy Specialist

## 2015-01-30 ENCOUNTER — Telehealth: Payer: Self-pay | Admitting: *Deleted

## 2015-01-30 VITALS — BP 96/50

## 2015-01-30 DIAGNOSIS — I241 Dressler's syndrome: Secondary | ICD-10-CM | POA: Diagnosis not present

## 2015-01-30 DIAGNOSIS — I480 Paroxysmal atrial fibrillation: Secondary | ICD-10-CM | POA: Diagnosis not present

## 2015-01-30 NOTE — Patient Instructions (Signed)
Continue Brilinta 90 mg twice daily until gone.  Twelve hours after stopping the Brilinta, start the clopidogrel 75 mg once daily.  Continue with the clopidogrel once daily.  This medication will help keep blood from sticking to your stent  Start Xarelto 20 mg once daily.  Take it with your evening meal.  This medication will decrease your risk of having a stroke due to the atrial fibrillation.

## 2015-01-30 NOTE — Telephone Encounter (Signed)
Message sent to [facility] to inform patient to d/c colchicine at office visit today.

## 2015-01-30 NOTE — Progress Notes (Signed)
Pt was started on Xarelto for atrial fibrillation on January 30, 2015.    Reviewed patients medication list.  Pt is not currently on any combined P-gp and strong CYP3A4 inhibitors/inducers (ketoconazole, traconazole, ritonavir, carbamazepine, phenytoin, rifampin, St. John's wort).  Reviewed labs.  SCr 1.10, Weight 66 kg, CrCl- 66.6.  Dose appropriate based on CrCl.   Hgb and HCT 12.4/37.4 (both improved from 2 months previous)  A full discussion of the nature of anticoagulants has been carried out.  A benefit/risk analysis has been presented to the patient, so that they understand the justification for choosing anticoagulation with Xarelto at this time.  The need for compliance is stressed.  Pt is aware to take the medication once daily with the largest meal of the day.  Side effects of potential bleeding are discussed, including unusual colored urine or stools, coughing up blood or coffee ground emesis, nose bleeds or serious fall or head trauma.  Discussed signs and symptoms of stroke. The patient should avoid any OTC items containing aspirin or ibuprofen.  Avoid alcohol consumption.   Call if any signs of abnormal bleeding.  Discussed financial obligations and resolved any difficulty in obtaining medication.  Next lab test test in 6 months.   Also spoke with patient about stopping colchicine and switching Brilinta 90 mg bid to clopidogrel 75 mg once daily.  Explained and gave written instruction on how to make this switch

## 2015-01-30 NOTE — Telephone Encounter (Signed)
-----   Message from Lorretta Harp, MD sent at 01/29/2015  9:16 PM EDT ----- Can prob DC ----- Message -----    From: Chauncy Lean, RN    Sent: 01/29/2015   1:49 PM      To: Lorretta Harp, MD  Keep or discontinue the colchicine?   ----- Message -----    From: Lorretta Harp, MD    Sent: 01/27/2015   4:12 PM      To: Chauncy Lean, RN  Erasmo Downer please ----- Message -----    From: Chauncy Lean, RN    Sent: 01/26/2015   3:04 PM      To: Lorretta Harp, MD  Would you like for me to bring the patient in to see you or Erasmo Downer to discuss the med change from Brilinta to plavix and addition of Noac?   ----- Message -----    From: Lorretta Harp, MD    Sent: 01/26/2015   4:53 AM      To: Thompson Grayer, MD, Chauncy Lean, RN  I'm OK to do both of those things Jeneen Rinks.  JJB ----- Message -----    From: Thompson Grayer, MD    Sent: 01/25/2015  10:23 PM      To: Lorretta Harp, MD  Happy to have seen Mr [place]... Very nice guy  If you are ok with switching brilinta to plavix then we could add a NOAC at this time. ? Do you think he could stop colchicine--> on it for dresslors  Will add beta blocker and repeat EF in 3 months.  Given myoview findings, I am doubtful that EF will recover.  If repeat EF is <35% when you see him in 3 months, please let me know.  Thanks!  JA

## 2015-01-31 ENCOUNTER — Encounter (HOSPITAL_COMMUNITY)
Admission: RE | Admit: 2015-01-31 | Discharge: 2015-01-31 | Disposition: A | Discharge status: Home / Self Care | Payer: Medicare Other | Source: Ambulatory Visit | Attending: Cardiovascular Disease | Admitting: Cardiovascular Disease

## 2015-01-31 ENCOUNTER — Encounter: Payer: Self-pay | Admitting: Cardiovascular Disease

## 2015-01-31 DIAGNOSIS — I252 Old myocardial infarction: Secondary | ICD-10-CM | POA: Diagnosis not present

## 2015-01-31 DIAGNOSIS — Z48812 Encounter for surgical aftercare following surgery on the circulatory system: Secondary | ICD-10-CM | POA: Diagnosis not present

## 2015-01-31 DIAGNOSIS — Z955 Presence of coronary angioplasty implant and graft: Secondary | ICD-10-CM | POA: Diagnosis not present

## 2015-01-31 MED ORDER — RIVAROXABAN 20 MG PO TABS: ORAL_TABLET | ORAL | Status: DC

## 2015-01-31 MED ORDER — CLOPIDOGREL BISULFATE 75 MG PO TABS: 75.0000 mg | ORAL_TABLET | Freq: Every day | ORAL | Status: DC

## 2015-02-02 ENCOUNTER — Encounter (HOSPITAL_COMMUNITY)
Admission: RE | Admit: 2015-02-02 | Discharge: 2015-02-02 | Disposition: A | Discharge status: Home / Self Care | Payer: Medicare Other | Source: Ambulatory Visit | Attending: Cardiovascular Disease | Admitting: Cardiovascular Disease

## 2015-02-02 DIAGNOSIS — Z48812 Encounter for surgical aftercare following surgery on the circulatory system: Secondary | ICD-10-CM | POA: Diagnosis not present

## 2015-02-02 DIAGNOSIS — Z955 Presence of coronary angioplasty implant and graft: Secondary | ICD-10-CM | POA: Diagnosis not present

## 2015-02-02 DIAGNOSIS — I252 Old myocardial infarction: Secondary | ICD-10-CM | POA: Diagnosis not present

## 2015-02-05 ENCOUNTER — Telehealth: Payer: Self-pay | Admitting: Cardiovascular Disease

## 2015-02-05 ENCOUNTER — Encounter (HOSPITAL_COMMUNITY)
Admission: RE | Admit: 2015-02-05 | Discharge: 2015-02-05 | Disposition: A | Discharge status: Home / Self Care | Payer: Medicare Other | Source: Ambulatory Visit | Attending: Cardiovascular Disease | Admitting: Cardiovascular Disease

## 2015-02-05 ENCOUNTER — Telehealth: Payer: Self-pay | Admitting: Internal Medicine

## 2015-02-05 DIAGNOSIS — I252 Old myocardial infarction: Secondary | ICD-10-CM | POA: Diagnosis not present

## 2015-02-05 DIAGNOSIS — Z48812 Encounter for surgical aftercare following surgery on the circulatory system: Secondary | ICD-10-CM | POA: Diagnosis not present

## 2015-02-05 DIAGNOSIS — Z79899 Other long term (current) drug therapy: Secondary | ICD-10-CM

## 2015-02-05 DIAGNOSIS — Z955 Presence of coronary angioplasty implant and graft: Secondary | ICD-10-CM | POA: Diagnosis not present

## 2015-02-05 NOTE — Telephone Encounter (Signed)
Pt have been bleeding from his monuth off and on, since his medicine was changed.

## 2015-02-05 NOTE — Telephone Encounter (Signed)
Patient reports intermittent bleeding, thinks is coming from the gumline - in any case, has been having oral bleeding for ~5-6 days. Bleeding self resolves, happening 1-2 times a day.  Meds changed on 6/14 - he was d/c'ed from his Brilinta, started on:  Plavix '75mg'$  daily Xarelto '20mg'$  daily  Notes taking as instructed and was asked to report unusual bleeding. Advised pt to continue meds for now, will route to pharmD to review and if further concerns can defer to cardiologist. Pt voiced agreement w/ plan.

## 2015-02-05 NOTE — Progress Notes (Signed)
  30 day Psychosocial followup assessment  Patient psychosocial assessment reveals no barriers to cardiac rehab participation.  Psychosocial areas that are affecting patient's rehab experience include concerns about overall health.  Pt has some anxiety regarding his weight loss that was similar to his brother who eventually was diagnoses pancreatic cancer and later passed away. Pt talked extensively regarding nutrition with the dietician.  Pt wife accompanied him to the consultation.  Pt recently seen by EP MD for evaluation of ICD.  Plan to hold off from that for now and recheck EF in a few months.  Pt feels comfortable with this decision.  Patient does continue to exhibit coping skills to deal with psychosocial concerns. Increased interaction noted with fellow participants.  Patient feels he is making progress towards cardiac rehab goals.  Patient reports health and activity level  improved in the past 30 days as evidenced by patient's reports being able to care for himself with no difficulties. Patient reports feeling positive about current and projected progress toward cardiac rehab goals.  Patient's rate of progress towards goals is good.  Plan of action to help patient continue to work towards rehab goals include monitoring MET level progression and consistent adherence to home exercise.  Will continue to monitor and evaluate progress toward psychosocial goals. Cherre Huger, BSN

## 2015-02-05 NOTE — Telephone Encounter (Signed)
New message      Talk to [facility].  Pt does not think future appt is correct.  He would not tell me any more

## 2015-02-06 NOTE — Telephone Encounter (Signed)
The patient should be on Xarelto , Plavix but no aspirin. Obtain A verify now P2 Y 12 level

## 2015-02-06 NOTE — Telephone Encounter (Signed)
Called and spoke with pt.  He states he notices a quarter size amount of blood on the pillow when he wakes up some morning as well as some bleeding when he flosses.  He has stopped flossing and that has helped.  He has had issues with nose bleeds related to sinus issues in the past but does not think this is the same.  Explained to him that some increased bleeding from gums is not uncommon with dual antiplatelet and anticoagulant therapy. We could not really stop either of his antiplatelet agents at this time given his recent PCI.  We could change him to a different DOAC but unsure that would resolve this issue. Suggested routine dental follow up and to let us know if it gets worse. He was agreeable to this.   Pt also mentioned he continues to have some arm pain since his procedure.  He states it occurs later in the evening (~8-9pm) and resolves within 2-3 minutes.  It is not constant.  Told him I would inform Dr. Gwenlyn Found.  He also wants to know if he is cleared to travel to [place].

## 2015-02-06 NOTE — Telephone Encounter (Signed)
Pedro Pope spoke with patient and fixed appointments

## 2015-02-06 NOTE — Telephone Encounter (Signed)
Okay to travel, continue antiplatelets therapy and oral anticoagulate

## 2015-02-07 ENCOUNTER — Encounter (HOSPITAL_COMMUNITY)
Admission: RE | Admit: 2015-02-07 | Discharge: 2015-02-07 | Disposition: A | Discharge status: Home / Self Care | Payer: Medicare Other | Source: Ambulatory Visit | Attending: Cardiovascular Disease | Admitting: Cardiovascular Disease

## 2015-02-07 ENCOUNTER — Institutional Professional Consult (permissible substitution): Payer: PRIVATE HEALTH INSURANCE | Admitting: Internal Medicine

## 2015-02-07 DIAGNOSIS — Z955 Presence of coronary angioplasty implant and graft: Secondary | ICD-10-CM | POA: Diagnosis not present

## 2015-02-07 DIAGNOSIS — Z48812 Encounter for surgical aftercare following surgery on the circulatory system: Secondary | ICD-10-CM | POA: Diagnosis not present

## 2015-02-07 DIAGNOSIS — I252 Old myocardial infarction: Secondary | ICD-10-CM | POA: Diagnosis not present

## 2015-02-07 NOTE — Telephone Encounter (Signed)
Patient notified to stop ASA and have p2y12 test drawn after being on the plavix for 2 weeks. He will have it drawn at [facility].

## 2015-02-07 NOTE — Telephone Encounter (Signed)
lmtc

## 2015-02-09 ENCOUNTER — Encounter (HOSPITAL_COMMUNITY)
Admission: RE | Admit: 2015-02-09 | Discharge: 2015-02-09 | Disposition: A | Discharge status: Home / Self Care | Payer: Medicare Other | Source: Ambulatory Visit | Attending: Cardiovascular Disease | Admitting: Cardiovascular Disease

## 2015-02-09 DIAGNOSIS — Z955 Presence of coronary angioplasty implant and graft: Secondary | ICD-10-CM | POA: Diagnosis not present

## 2015-02-09 DIAGNOSIS — Z48812 Encounter for surgical aftercare following surgery on the circulatory system: Secondary | ICD-10-CM | POA: Diagnosis not present

## 2015-02-09 DIAGNOSIS — I252 Old myocardial infarction: Secondary | ICD-10-CM | POA: Diagnosis not present

## 2015-02-12 ENCOUNTER — Encounter (HOSPITAL_COMMUNITY)
Admission: RE | Admit: 2015-02-12 | Discharge: 2015-02-12 | Disposition: A | Discharge status: Home / Self Care | Payer: Medicare Other | Source: Ambulatory Visit | Attending: Cardiovascular Disease | Admitting: Cardiovascular Disease

## 2015-02-12 DIAGNOSIS — I252 Old myocardial infarction: Secondary | ICD-10-CM | POA: Diagnosis not present

## 2015-02-12 DIAGNOSIS — Z955 Presence of coronary angioplasty implant and graft: Secondary | ICD-10-CM | POA: Diagnosis not present

## 2015-02-12 DIAGNOSIS — Z48812 Encounter for surgical aftercare following surgery on the circulatory system: Secondary | ICD-10-CM | POA: Diagnosis not present

## 2015-02-14 ENCOUNTER — Encounter (HOSPITAL_COMMUNITY)
Admission: RE | Admit: 2015-02-14 | Discharge: 2015-02-14 | Disposition: A | Discharge status: Home / Self Care | Payer: Medicare Other | Source: Ambulatory Visit | Attending: Cardiovascular Disease | Admitting: Cardiovascular Disease

## 2015-02-14 DIAGNOSIS — Z48812 Encounter for surgical aftercare following surgery on the circulatory system: Secondary | ICD-10-CM | POA: Diagnosis not present

## 2015-02-14 DIAGNOSIS — Z955 Presence of coronary angioplasty implant and graft: Secondary | ICD-10-CM | POA: Diagnosis not present

## 2015-02-14 DIAGNOSIS — I252 Old myocardial infarction: Secondary | ICD-10-CM | POA: Diagnosis not present

## 2015-02-16 ENCOUNTER — Encounter (HOSPITAL_COMMUNITY)
Admission: RE | Admit: 2015-02-16 | Discharge: 2015-02-16 | Disposition: A | Discharge status: Home / Self Care | Payer: Medicare Other | Source: Ambulatory Visit | Attending: Cardiovascular Disease | Admitting: Cardiovascular Disease

## 2015-02-16 DIAGNOSIS — I252 Old myocardial infarction: Secondary | ICD-10-CM | POA: Diagnosis not present

## 2015-02-16 DIAGNOSIS — Z955 Presence of coronary angioplasty implant and graft: Secondary | ICD-10-CM | POA: Diagnosis not present

## 2015-02-16 DIAGNOSIS — Z48812 Encounter for surgical aftercare following surgery on the circulatory system: Secondary | ICD-10-CM | POA: Insufficient documentation

## 2015-02-21 ENCOUNTER — Encounter (HOSPITAL_COMMUNITY)
Admission: RE | Admit: 2015-02-21 | Discharge: 2015-02-21 | Disposition: A | Discharge status: Home / Self Care | Payer: Medicare Other | Source: Ambulatory Visit | Attending: Cardiovascular Disease | Admitting: Cardiovascular Disease

## 2015-02-21 ENCOUNTER — Telehealth: Payer: Self-pay | Admitting: Cardiovascular Disease

## 2015-02-21 DIAGNOSIS — I252 Old myocardial infarction: Secondary | ICD-10-CM | POA: Diagnosis not present

## 2015-02-21 DIAGNOSIS — Z955 Presence of coronary angioplasty implant and graft: Secondary | ICD-10-CM | POA: Diagnosis not present

## 2015-02-21 DIAGNOSIS — Z48812 Encounter for surgical aftercare following surgery on the circulatory system: Secondary | ICD-10-CM | POA: Diagnosis not present

## 2015-02-21 LAB — PLATELET INHIBITION P2Y12: Platelet Function  P2Y12: 255 [PRU] (ref 194–418)

## 2015-02-21 NOTE — Telephone Encounter (Signed)
Pedro Pope called to request P2Y12 lab order. Gave me fax to send to. I printed order, this was faxed to her at provided number.  I called back to inform.

## 2015-02-23 ENCOUNTER — Encounter (HOSPITAL_COMMUNITY)
Admission: RE | Admit: 2015-02-23 | Discharge: 2015-02-23 | Disposition: A | Discharge status: Home / Self Care | Payer: Medicare Other | Source: Ambulatory Visit | Attending: Cardiovascular Disease | Admitting: Cardiovascular Disease

## 2015-02-23 DIAGNOSIS — Z955 Presence of coronary angioplasty implant and graft: Secondary | ICD-10-CM | POA: Diagnosis not present

## 2015-02-23 DIAGNOSIS — Z48812 Encounter for surgical aftercare following surgery on the circulatory system: Secondary | ICD-10-CM | POA: Diagnosis not present

## 2015-02-23 DIAGNOSIS — I252 Old myocardial infarction: Secondary | ICD-10-CM | POA: Diagnosis not present

## 2015-02-26 ENCOUNTER — Encounter (HOSPITAL_COMMUNITY)
Admission: RE | Admit: 2015-02-26 | Discharge: 2015-02-26 | Disposition: A | Discharge status: Home / Self Care | Payer: Medicare Other | Source: Ambulatory Visit | Attending: Cardiovascular Disease | Admitting: Cardiovascular Disease

## 2015-02-26 ENCOUNTER — Telehealth: Payer: Self-pay | Admitting: Cardiovascular Disease

## 2015-02-26 DIAGNOSIS — Z955 Presence of coronary angioplasty implant and graft: Secondary | ICD-10-CM | POA: Diagnosis not present

## 2015-02-26 DIAGNOSIS — Z48812 Encounter for surgical aftercare following surgery on the circulatory system: Secondary | ICD-10-CM | POA: Diagnosis not present

## 2015-02-26 DIAGNOSIS — I252 Old myocardial infarction: Secondary | ICD-10-CM | POA: Diagnosis not present

## 2015-02-26 NOTE — Telephone Encounter (Signed)
Pedro Pope is calling because she has several things he wants to speak about and the report on his blood test .. Please call

## 2015-02-26 NOTE — Progress Notes (Signed)
Pt in today for cardiac rehab.  Pt pre exercise bp with diastolic of 42.  Pt given h20 to drink.  Recheck orthostatic bp sitting 108/60, standing 106/56.  Pt oka to proceed with exercise.  During recheck pt remarked that he had an episode over the weekend where he was dizzy.  On Saturday morning about 3:30 pt awoke to go to the bathroom.  Pt remarked that he felt dizzy and needed help to get to the bathroom.  Pt did not have any reoccurrence and presently denies any symptoms.  Later on during second station, pt noted to have increased ectopy-couplets that were frequent in nature. Pt observed pushing hard on the stepper at a rate of 150 steps per minutes on level 5. Pt advised to dec workload and steps to no more than 100.  Ectopy on the monitor immediately decreased.  Will send rehab report and ekg strips to Dr. Gwenlyn Found for review. Cherre Huger, BSN

## 2015-02-26 NOTE — Telephone Encounter (Signed)
Pt. called in wanting to talk to Curt Bears about his message in [facility]

## 2015-02-27 ENCOUNTER — Telehealth: Payer: Self-pay | Admitting: Cardiovascular Disease

## 2015-02-27 NOTE — Telephone Encounter (Signed)
Patient complains of gout in his big toe, which he has a history of.  Patient was recently taking colchicine for his heart and has some left over.  Pedro Pope wants to know if he can take the remaining colchicine and get that RX renewed by Dr Gwenlyn Found.  I will defer to Dr Gwenlyn Found.

## 2015-02-27 NOTE — Telephone Encounter (Signed)
Pt said he talked to you yesterday,he forgot to ask you a question.

## 2015-02-28 ENCOUNTER — Encounter (HOSPITAL_COMMUNITY): Payer: Medicare Other

## 2015-02-28 ENCOUNTER — Telehealth (HOSPITAL_COMMUNITY): Payer: Self-pay | Admitting: Internal Medicine

## 2015-03-01 NOTE — Telephone Encounter (Signed)
The patient's colchicine will need to be renewed by his primary care physician

## 2015-03-02 ENCOUNTER — Encounter (HOSPITAL_COMMUNITY): Payer: Medicare Other

## 2015-03-02 NOTE — Telephone Encounter (Signed)
Patient advised to contact PCP for refill due to gout.

## 2015-03-05 ENCOUNTER — Encounter (HOSPITAL_COMMUNITY)
Admission: RE | Admit: 2015-03-05 | Discharge: 2015-03-05 | Disposition: A | Discharge status: Home / Self Care | Payer: Medicare Other | Source: Ambulatory Visit | Attending: Cardiovascular Disease | Admitting: Cardiovascular Disease

## 2015-03-05 DIAGNOSIS — Z955 Presence of coronary angioplasty implant and graft: Secondary | ICD-10-CM | POA: Diagnosis not present

## 2015-03-05 DIAGNOSIS — Z48812 Encounter for surgical aftercare following surgery on the circulatory system: Secondary | ICD-10-CM | POA: Diagnosis not present

## 2015-03-05 DIAGNOSIS — I252 Old myocardial infarction: Secondary | ICD-10-CM | POA: Diagnosis not present

## 2015-03-05 NOTE — Progress Notes (Signed)
Pt returned back to rehab after absences due to gout flare up.  Pt reported to rehab staff feeling "fluttering" in his chest area that comes and goes over a period of weeks.  Pt presently does not feel this discomfort.  Pt has not notified the md regarding this sensation.  Pt did not take any NTG for the pain because he felt like it wasn't bad enough.  Pt with history of ectopy and ? "fluttering sensation" be related to his ectopy. Pt proceeded with exercise with modifications due to gout pain in his feet.  Pt tolerated exercise with no complaints or return of "fluttering sensation".  Pt has upcoming appointment with md and he will mention this to him.  Continue to monitor. Cherre Huger, BSN

## 2015-03-06 ENCOUNTER — Ambulatory Visit: Payer: PRIVATE HEALTH INSURANCE | Admitting: Cardiovascular Disease

## 2015-03-07 ENCOUNTER — Encounter (HOSPITAL_COMMUNITY): Admission: RE | Admit: 2015-03-07 | Payer: Medicare Other | Source: Ambulatory Visit

## 2015-03-07 ENCOUNTER — Telehealth (HOSPITAL_COMMUNITY): Payer: Self-pay | Admitting: *Deleted

## 2015-03-07 DIAGNOSIS — M109 Gout, unspecified: Secondary | ICD-10-CM | POA: Diagnosis not present

## 2015-03-07 DIAGNOSIS — Z6822 Body mass index (BMI) 22.0-22.9, adult: Secondary | ICD-10-CM | POA: Diagnosis not present

## 2015-03-07 DIAGNOSIS — I255 Ischemic cardiomyopathy: Secondary | ICD-10-CM | POA: Diagnosis not present

## 2015-03-09 ENCOUNTER — Encounter (HOSPITAL_COMMUNITY)
Admission: RE | Admit: 2015-03-09 | Discharge: 2015-03-09 | Disposition: A | Discharge status: Home / Self Care | Payer: Medicare Other | Source: Ambulatory Visit | Attending: Cardiovascular Disease | Admitting: Cardiovascular Disease

## 2015-03-09 DIAGNOSIS — Z955 Presence of coronary angioplasty implant and graft: Secondary | ICD-10-CM | POA: Diagnosis not present

## 2015-03-09 DIAGNOSIS — I252 Old myocardial infarction: Secondary | ICD-10-CM | POA: Diagnosis not present

## 2015-03-09 DIAGNOSIS — Z48812 Encounter for surgical aftercare following surgery on the circulatory system: Secondary | ICD-10-CM | POA: Diagnosis not present

## 2015-03-09 NOTE — Progress Notes (Signed)
  60 day Psychosocial followup assessment  Pt absent on 60 day review due to complaints of gout discomfort.  Review completed on next day. Patient psychosocial assessment reveals no barriers to cardiac rehab participation. Psychosocial areas that are affecting patient's rehab experience include concerns about overall health. Pt has anxious behaviors that sometimes causes him to over analyze every little thing,  This is reported by the patient.  Despite encouragement and support, pt has multiple comments and questions about his heart health and overall healthy seemingly every day he participates in Cardiac Rehab.  Pt recently seen by EP MD for evaluation of ICD. Plan to hold off from that for now and recheck EF in a few months. Pt feels comfortable with this decision. Pt with interruption in his participation due to complaints of gout.  Pt presently on prednisone dose pack. Talked with pt regarding possible side effects. Increased interaction noted with fellow participants. Patient feels he is making progress towards cardiac rehab goals. Patient reports health and activity level improved in the past 30 days as evidenced by patient's reports being able to perform light household duties. Patient reports feeling positive about current and projected progress toward cardiac rehab goals. Patient's rate of progress towards goals is good. Plan of action to help patient continue to work towards rehab goals include monitoring MET level progression and consistent adherence to home exercise. Will continue to monitor and evaluate progress toward psychosocial goals. Cherre Huger, BSN

## 2015-03-12 ENCOUNTER — Encounter (HOSPITAL_COMMUNITY)
Admission: RE | Admit: 2015-03-12 | Discharge: 2015-03-12 | Disposition: A | Discharge status: Home / Self Care | Payer: Medicare Other | Source: Ambulatory Visit | Attending: Cardiovascular Disease | Admitting: Cardiovascular Disease

## 2015-03-12 DIAGNOSIS — I252 Old myocardial infarction: Secondary | ICD-10-CM | POA: Diagnosis not present

## 2015-03-12 DIAGNOSIS — Z955 Presence of coronary angioplasty implant and graft: Secondary | ICD-10-CM | POA: Diagnosis not present

## 2015-03-12 DIAGNOSIS — Z48812 Encounter for surgical aftercare following surgery on the circulatory system: Secondary | ICD-10-CM | POA: Diagnosis not present

## 2015-03-14 ENCOUNTER — Encounter (HOSPITAL_COMMUNITY)
Admission: RE | Admit: 2015-03-14 | Discharge: 2015-03-14 | Disposition: A | Discharge status: Home / Self Care | Payer: Medicare Other | Source: Ambulatory Visit | Attending: Cardiovascular Disease | Admitting: Cardiovascular Disease

## 2015-03-14 DIAGNOSIS — Z955 Presence of coronary angioplasty implant and graft: Secondary | ICD-10-CM | POA: Diagnosis not present

## 2015-03-14 DIAGNOSIS — Z48812 Encounter for surgical aftercare following surgery on the circulatory system: Secondary | ICD-10-CM | POA: Diagnosis not present

## 2015-03-14 DIAGNOSIS — I252 Old myocardial infarction: Secondary | ICD-10-CM | POA: Diagnosis not present

## 2015-03-16 ENCOUNTER — Encounter (HOSPITAL_COMMUNITY)
Admission: RE | Admit: 2015-03-16 | Discharge: 2015-03-16 | Disposition: A | Discharge status: Home / Self Care | Payer: Medicare Other | Source: Ambulatory Visit | Attending: Cardiovascular Disease | Admitting: Cardiovascular Disease

## 2015-03-16 ENCOUNTER — Encounter: Payer: Self-pay | Admitting: Cardiovascular Disease

## 2015-03-16 DIAGNOSIS — I252 Old myocardial infarction: Secondary | ICD-10-CM | POA: Diagnosis not present

## 2015-03-16 DIAGNOSIS — Z955 Presence of coronary angioplasty implant and graft: Secondary | ICD-10-CM | POA: Diagnosis not present

## 2015-03-16 DIAGNOSIS — Z48812 Encounter for surgical aftercare following surgery on the circulatory system: Secondary | ICD-10-CM | POA: Diagnosis not present

## 2015-03-19 ENCOUNTER — Encounter (HOSPITAL_COMMUNITY)
Admission: RE | Admit: 2015-03-19 | Discharge: 2015-03-19 | Disposition: A | Discharge status: Home / Self Care | Payer: Medicare Other | Source: Ambulatory Visit | Attending: Cardiovascular Disease | Admitting: Cardiovascular Disease

## 2015-03-19 DIAGNOSIS — Z48812 Encounter for surgical aftercare following surgery on the circulatory system: Secondary | ICD-10-CM | POA: Insufficient documentation

## 2015-03-19 DIAGNOSIS — Z955 Presence of coronary angioplasty implant and graft: Secondary | ICD-10-CM | POA: Diagnosis not present

## 2015-03-19 DIAGNOSIS — C61 Malignant neoplasm of prostate: Secondary | ICD-10-CM | POA: Diagnosis not present

## 2015-03-19 DIAGNOSIS — N401 Enlarged prostate with lower urinary tract symptoms: Secondary | ICD-10-CM | POA: Diagnosis not present

## 2015-03-19 DIAGNOSIS — R972 Elevated prostate specific antigen [PSA]: Secondary | ICD-10-CM | POA: Diagnosis not present

## 2015-03-19 DIAGNOSIS — I252 Old myocardial infarction: Secondary | ICD-10-CM | POA: Insufficient documentation

## 2015-03-21 ENCOUNTER — Encounter (HOSPITAL_COMMUNITY)
Admission: RE | Admit: 2015-03-21 | Discharge: 2015-03-21 | Disposition: A | Discharge status: Home / Self Care | Payer: Medicare Other | Source: Ambulatory Visit | Attending: Cardiovascular Disease | Admitting: Cardiovascular Disease

## 2015-03-21 DIAGNOSIS — I252 Old myocardial infarction: Secondary | ICD-10-CM | POA: Diagnosis not present

## 2015-03-21 DIAGNOSIS — Z955 Presence of coronary angioplasty implant and graft: Secondary | ICD-10-CM | POA: Diagnosis not present

## 2015-03-21 DIAGNOSIS — Z48812 Encounter for surgical aftercare following surgery on the circulatory system: Secondary | ICD-10-CM | POA: Diagnosis not present

## 2015-03-23 ENCOUNTER — Encounter (HOSPITAL_COMMUNITY)
Admission: RE | Admit: 2015-03-23 | Discharge: 2015-03-23 | Disposition: A | Discharge status: Home / Self Care | Payer: Medicare Other | Source: Ambulatory Visit | Attending: Cardiovascular Disease | Admitting: Cardiovascular Disease

## 2015-03-23 DIAGNOSIS — I252 Old myocardial infarction: Secondary | ICD-10-CM | POA: Diagnosis not present

## 2015-03-23 DIAGNOSIS — Z955 Presence of coronary angioplasty implant and graft: Secondary | ICD-10-CM | POA: Diagnosis not present

## 2015-03-23 DIAGNOSIS — Z48812 Encounter for surgical aftercare following surgery on the circulatory system: Secondary | ICD-10-CM | POA: Diagnosis not present

## 2015-03-26 ENCOUNTER — Encounter (HOSPITAL_COMMUNITY)
Admission: RE | Admit: 2015-03-26 | Discharge: 2015-03-26 | Disposition: A | Discharge status: Home / Self Care | Payer: Medicare Other | Source: Ambulatory Visit | Attending: Cardiovascular Disease | Admitting: Cardiovascular Disease

## 2015-03-26 DIAGNOSIS — I252 Old myocardial infarction: Secondary | ICD-10-CM | POA: Diagnosis not present

## 2015-03-26 DIAGNOSIS — Z955 Presence of coronary angioplasty implant and graft: Secondary | ICD-10-CM | POA: Diagnosis not present

## 2015-03-26 DIAGNOSIS — Z48812 Encounter for surgical aftercare following surgery on the circulatory system: Secondary | ICD-10-CM | POA: Diagnosis not present

## 2015-03-28 ENCOUNTER — Encounter (HOSPITAL_COMMUNITY)
Admission: RE | Admit: 2015-03-28 | Discharge: 2015-03-28 | Disposition: A | Discharge status: Home / Self Care | Payer: Medicare Other | Source: Ambulatory Visit | Attending: Cardiovascular Disease | Admitting: Cardiovascular Disease

## 2015-03-28 DIAGNOSIS — I252 Old myocardial infarction: Secondary | ICD-10-CM | POA: Diagnosis not present

## 2015-03-28 DIAGNOSIS — Z48812 Encounter for surgical aftercare following surgery on the circulatory system: Secondary | ICD-10-CM | POA: Diagnosis not present

## 2015-03-28 DIAGNOSIS — Z955 Presence of coronary angioplasty implant and graft: Secondary | ICD-10-CM | POA: Diagnosis not present

## 2015-03-30 ENCOUNTER — Encounter (HOSPITAL_COMMUNITY): Payer: Medicare Other

## 2015-03-30 DIAGNOSIS — Z6822 Body mass index (BMI) 22.0-22.9, adult: Secondary | ICD-10-CM | POA: Diagnosis not present

## 2015-03-30 DIAGNOSIS — I255 Ischemic cardiomyopathy: Secondary | ICD-10-CM | POA: Diagnosis not present

## 2015-03-30 DIAGNOSIS — R739 Hyperglycemia, unspecified: Secondary | ICD-10-CM | POA: Diagnosis not present

## 2015-03-30 DIAGNOSIS — E781 Pure hyperglyceridemia: Secondary | ICD-10-CM | POA: Diagnosis not present

## 2015-03-30 DIAGNOSIS — I4891 Unspecified atrial fibrillation: Secondary | ICD-10-CM | POA: Diagnosis not present

## 2015-03-30 DIAGNOSIS — I509 Heart failure, unspecified: Secondary | ICD-10-CM | POA: Diagnosis not present

## 2015-03-30 DIAGNOSIS — I2510 Atherosclerotic heart disease of native coronary artery without angina pectoris: Secondary | ICD-10-CM | POA: Diagnosis not present

## 2015-03-30 DIAGNOSIS — M109 Gout, unspecified: Secondary | ICD-10-CM | POA: Diagnosis not present

## 2015-03-30 DIAGNOSIS — Z9861 Coronary angioplasty status: Secondary | ICD-10-CM | POA: Diagnosis not present

## 2015-03-31 ENCOUNTER — Encounter: Payer: Self-pay | Admitting: Cardiovascular Disease

## 2015-04-02 ENCOUNTER — Encounter (HOSPITAL_COMMUNITY)
Admission: RE | Admit: 2015-04-02 | Discharge: 2015-04-02 | Disposition: A | Discharge status: Home / Self Care | Payer: Medicare Other | Source: Ambulatory Visit | Attending: Cardiovascular Disease | Admitting: Cardiovascular Disease

## 2015-04-02 DIAGNOSIS — I252 Old myocardial infarction: Secondary | ICD-10-CM | POA: Diagnosis not present

## 2015-04-02 DIAGNOSIS — Z48812 Encounter for surgical aftercare following surgery on the circulatory system: Secondary | ICD-10-CM | POA: Diagnosis not present

## 2015-04-02 DIAGNOSIS — Z955 Presence of coronary angioplasty implant and graft: Secondary | ICD-10-CM | POA: Diagnosis not present

## 2015-04-04 ENCOUNTER — Encounter (HOSPITAL_COMMUNITY)
Admission: RE | Admit: 2015-04-04 | Discharge: 2015-04-04 | Disposition: A | Discharge status: Home / Self Care | Payer: Medicare Other | Source: Ambulatory Visit | Attending: Cardiovascular Disease | Admitting: Cardiovascular Disease

## 2015-04-04 DIAGNOSIS — Z48812 Encounter for surgical aftercare following surgery on the circulatory system: Secondary | ICD-10-CM | POA: Diagnosis not present

## 2015-04-04 DIAGNOSIS — I252 Old myocardial infarction: Secondary | ICD-10-CM | POA: Diagnosis not present

## 2015-04-04 DIAGNOSIS — Z955 Presence of coronary angioplasty implant and graft: Secondary | ICD-10-CM | POA: Diagnosis not present

## 2015-04-06 ENCOUNTER — Encounter (HOSPITAL_COMMUNITY)
Admission: RE | Admit: 2015-04-06 | Discharge: 2015-04-06 | Disposition: A | Discharge status: Home / Self Care | Payer: Medicare Other | Source: Ambulatory Visit | Attending: Cardiovascular Disease | Admitting: Cardiovascular Disease

## 2015-04-06 DIAGNOSIS — I252 Old myocardial infarction: Secondary | ICD-10-CM | POA: Diagnosis not present

## 2015-04-06 DIAGNOSIS — Z955 Presence of coronary angioplasty implant and graft: Secondary | ICD-10-CM | POA: Diagnosis not present

## 2015-04-06 DIAGNOSIS — Z48812 Encounter for surgical aftercare following surgery on the circulatory system: Secondary | ICD-10-CM | POA: Diagnosis not present

## 2015-04-09 ENCOUNTER — Encounter (HOSPITAL_COMMUNITY)
Admission: RE | Admit: 2015-04-09 | Discharge: 2015-04-09 | Disposition: A | Discharge status: Home / Self Care | Payer: Medicare Other | Source: Ambulatory Visit | Attending: Cardiovascular Disease | Admitting: Cardiovascular Disease

## 2015-04-09 DIAGNOSIS — Z48812 Encounter for surgical aftercare following surgery on the circulatory system: Secondary | ICD-10-CM | POA: Diagnosis not present

## 2015-04-09 DIAGNOSIS — I252 Old myocardial infarction: Secondary | ICD-10-CM | POA: Diagnosis not present

## 2015-04-09 DIAGNOSIS — Z955 Presence of coronary angioplasty implant and graft: Secondary | ICD-10-CM | POA: Diagnosis not present

## 2015-04-09 NOTE — Progress Notes (Signed)
Pt graduated from cardiac rehab program today with completion of 36 exercise sessions in Phase II. Pt maintained good attendance to exercise and education classes and progressed nicely during his participation in rehab as evidenced by increased MET level. Pt increased from 3.2 to 4.9. Pt seen positively interacting with fellow participants. Medication list reconciled. Repeat  PHQ score-0 .  Pt has made significant lifestyle changes and should be commended for his success.Pt feels he is less anxious but still worries as he laughs. Pt is eager to have his echocardiogram completed again in September to see if there is any improvement of his EF.  Pt feels he has achieved his goals during cardiac rehab.  Pt feels he has his energy back.  Pt desired to get back in rhythm and this is a partially met goal. Pt has periodic checks where he feels his heart beat skips.  Pt with history of afib with frequent pvc and couplets.  Pt long term goal is progressing toward meeting this gaol.  Pt gain 2 pounds since beginning his participation in cardiac rehab.  Pt has met with the dietician for advice regarding safe weight gain. Pt plans to continue exercise in cardiac maintenance program. Maurice Small RN, BSN

## 2015-04-19 ENCOUNTER — Other Ambulatory Visit: Payer: Self-pay

## 2015-04-19 ENCOUNTER — Ambulatory Visit (HOSPITAL_COMMUNITY): Payer: Medicare Other | Attending: Cardiology

## 2015-04-19 DIAGNOSIS — I517 Cardiomegaly: Secondary | ICD-10-CM | POA: Diagnosis not present

## 2015-04-19 DIAGNOSIS — I480 Paroxysmal atrial fibrillation: Secondary | ICD-10-CM | POA: Diagnosis not present

## 2015-04-19 DIAGNOSIS — I351 Nonrheumatic aortic (valve) insufficiency: Secondary | ICD-10-CM | POA: Insufficient documentation

## 2015-04-19 DIAGNOSIS — I4891 Unspecified atrial fibrillation: Secondary | ICD-10-CM | POA: Diagnosis not present

## 2015-04-19 DIAGNOSIS — I340 Nonrheumatic mitral (valve) insufficiency: Secondary | ICD-10-CM | POA: Insufficient documentation

## 2015-04-24 ENCOUNTER — Ambulatory Visit (INDEPENDENT_AMBULATORY_CARE_PROVIDER_SITE_OTHER): Payer: Medicare Other | Admitting: Cardiovascular Disease

## 2015-04-24 ENCOUNTER — Encounter: Payer: Self-pay | Admitting: Cardiovascular Disease

## 2015-04-24 VITALS — BP 96/54 | HR 52 | Ht 68.0 in | Wt 150.0 lb

## 2015-04-24 DIAGNOSIS — E785 Hyperlipidemia, unspecified: Secondary | ICD-10-CM | POA: Diagnosis not present

## 2015-04-24 DIAGNOSIS — I255 Ischemic cardiomyopathy: Secondary | ICD-10-CM | POA: Diagnosis not present

## 2015-04-24 DIAGNOSIS — I2109 ST elevation (STEMI) myocardial infarction involving other coronary artery of anterior wall: Secondary | ICD-10-CM | POA: Diagnosis not present

## 2015-04-24 DIAGNOSIS — I241 Dressler's syndrome: Secondary | ICD-10-CM

## 2015-04-24 DIAGNOSIS — I480 Paroxysmal atrial fibrillation: Secondary | ICD-10-CM

## 2015-04-24 NOTE — Assessment & Plan Note (Addendum)
[name] had an acute anterolateral myocardial infarction treated by direct intervention 10/30/14 with a drug-eluting stent to his proximal LAD by myself. He had a left dominant system with moderate atypia gross circumflex disease and high-grade nondominant RCA disease. His EF at that time was 4045%. He is on antiplatelet therapy which initially was [place]. He transitioned to Plavix once he started a novel oral anticoagulant

## 2015-04-24 NOTE — Assessment & Plan Note (Signed)
History of paroxysmal atrial fibrillation which was diagnosed 11/03/14. He was ultimately placed on a novel oral anticoagulative by Dr. Rayann Heman because his CHA2DSVASC2 score is  3 . He was transitioned from [place] to products. He still has fairly frequent episodes of PAF which he is minimally symptomatically.

## 2015-04-24 NOTE — Assessment & Plan Note (Signed)
History of hyperlipidemia or atorvastatin 40 mg a day with recent lipid profile performed 01/16/15 revealing a total cholesterol 74, LDL 23 and HDL 31.

## 2015-04-24 NOTE — Assessment & Plan Note (Signed)
History of ischemic cardiac myopathy with an ejection fraction by 2-D echo 01/17/15 of 30-35% which has since improved to 40-45% by recent 2-D echo 04/19/15 obviating the need for consideration of ICD therapy.

## 2015-04-24 NOTE — Progress Notes (Signed)
04/24/2015 [name].   14-Jun-1941  [id number]  Primary Physician Precious Reel, MD Primary Cardiologist: Lorretta Harp MD [name]   HPI:  Mr. Dirr is a delightful 74 year old thin appearing very Caucasian male father of 41, grandfather 7 grandchildren and great grandfather of 19 great grandchildren . He  is a patient of [name]'s. I last saw him in the office 01/19/15. 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 He basically had no risk factors prior to his presentation. He had an acute anterolateral infarction. By direct intervention by myself 10/30/14 with a drug-eluting stent in his proximal LAD. He had a left dominant system and had moderate proximal AV groove circumflex disease with high-grade nondominant RCA disease. Ejection fraction was in the 40-45% range with moderate anteroapical hypokinesia. He was discharged home 2 days after admission and was admitted several days later with A. Fib with RVR which she was symptomatic. His EKG showed changes and he has converted to sinus rhythm. Consideration was given to oral anticoagulation over the risks of bleeding were thought to outweigh the benefits. He is wearing an event monitor and has noticed several episodes of brief PAF. His beta blocker was discontinued because of bradycardia and hypotension. Recent Myoview showed dense scar in the LAD territory and 2-D echo revealed an EF of 30-35%. He participated  in cardiac rehabilitation and is relatively asymptomatic. His most recent 2-D echo performed 04/19/15 showed improvement in his LV function up to the 40-45% range. I did send him to see Dr. Rayann Heman for consideration of ICD therapy and to discuss his A. Fib. He was ultimately placed on Xarelto oral anti-coagulation and his Luna Kitchens was transitioned to Plavix to mitigate his bleeding risk.   Current Outpatient Prescriptions  Medication Sig Dispense Refill  . Ascorbic Acid (VITAMIN C) 500  MG tablet Take 500 mg by mouth daily.      Marland Kitchen atorvastatin (LIPITOR) 40 MG tablet Take 1 tablet (40 mg total) by mouth daily at 6 PM. 30 tablet 11  . carvedilol (COREG) 3.125 MG tablet Take 1 tablet (3.125 mg total) by mouth 2 (two) times daily. 180 tablet 3  . clopidogrel (PLAVIX) 75 MG tablet Take 1 tablet (75 mg total) by mouth daily. 30 tablet 5  . colchicine 0.6 MG tablet Take 1 tablet (0.6 mg total) by mouth daily. (Patient taking differently: Take 0.6 mg by mouth as needed. AS NEEDED FOR GOUT.) 30 tablet 3  . lisinopril (PRINIVIL,ZESTRIL) 5 MG tablet Take 1 tablet (5 mg total) by mouth daily. Taken at night 90 tablet 3  . nitroGLYCERIN (NITROSTAT) 0.4 MG SL tablet Place 1 tablet (0.4 mg total) under the tongue every 5 (five) minutes as needed for chest pain. 25 tablet 5  . rivaroxaban (XARELTO) 20 MG TABS tablet Take 1 tablet by mouth daily with supper or largest meal of the day 30 tablet 5   No current facility-administered medications for this visit.    Allergies  Allergen Reactions  . Penicillins Anaphylaxis  . Corn Syrup [Glucose] Other (See Comments)    High fructose corn syrup causes gout  . Aspirin Adult Low [Aspirin] Itching and Rash    Dr. [name] suspects that long term caused a rash, short term use appears to be ok    Social History   Social History  . Marital Status: Married    Spouse Name: N/A  . Number of Children: N/A  . Years of  Education: N/A   Occupational History  . Not on file.   Social History Main Topics  . Smoking status: Former Smoker    Quit date: 08/18/1992  . Smokeless tobacco: Not on file  . Alcohol Use: Yes     Comment: social  . Drug Use: No  . Sexual Activity: Not on file   Other Topics Concern  . Not on file   Social History Narrative   Pt lives in [place] with spouse.  Realtor.  Retired Environmental education officer     Review of Systems: General: negative for chills, fever, night sweats or weight changes.  Cardiovascular: negative for chest  pain, dyspnea on exertion, edema, orthopnea, palpitations, paroxysmal nocturnal dyspnea or shortness of breath Dermatological: negative for rash Respiratory: negative for cough or wheezing Urologic: negative for hematuria Abdominal: negative for nausea, vomiting, diarrhea, bright red blood per rectum, melena, or hematemesis Neurologic: negative for visual changes, syncope, or dizziness All other systems reviewed and are otherwise negative except as noted above.    Blood pressure 96/54, pulse 52, height '5\' 8"'$  (1.727 m), weight 150 lb (68.04 kg).  General appearance: alert and no distress Neck: no adenopathy, no carotid bruit, no JVD, supple, symmetrical, trachea midline and thyroid not enlarged, symmetric, no tenderness/mass/nodules Lungs: clear to auscultation bilaterally Heart: regular rate and rhythm, S1, S2 normal, no murmur, click, rub or gallop Extremities: extremities normal, atraumatic, no cyanosis or edema Pulses: 2+ and symmetric Skin: Skin color, texture, turgor normal. No rashes or lesions Neurologic: Grossly normal  EKG not performed today  ASSESSMENT AND PLAN:   ST elevation myocardial infarction (STEMI) involving other coronary artery of anterior wall - STEMI s/p DES to proximal LAD; residual 80 % proximal and 60% mid LCx diease Mr. Trickett had an acute anterolateral myocardial infarction treated by direct intervention 10/30/14 with a drug-eluting stent to his proximal LAD by myself. He had a left dominant system with moderate atypia gross circumflex disease and high-grade nondominant RCA disease. His EF at that time was 4045%. He is on antiplatelet therapy which initially was [place]. He transitioned to Plavix once he started a novel oral anticoagulant  PAF (paroxysmal atrial fibrillation) History of paroxysmal atrial fibrillation which was diagnosed 11/03/14. He was ultimately placed on a novel oral anticoagulative by Dr. Rayann Heman because his CHA2DSVASC2 score is  3 . He was  transitioned from [place] to products. He still has fairly frequent episodes of PAF which he is minimally symptomatically.  Ischemic cardiomyopathy History of ischemic cardiac myopathy with an ejection fraction by 2-D echo 01/17/15 of 30-35% which has since improved to 40-45% by recent 2-D echo 04/19/15 obviating the need for consideration of ICD therapy.  Hyperlipidemia History of hyperlipidemia or atorvastatin 40 mg a day with recent lipid profile performed 01/16/15 revealing a total cholesterol 74, LDL 23 and HDL 31.      Lorretta Harp MD [facility], [facility] 04/24/2015 2:02 PM

## 2015-04-24 NOTE — Patient Instructions (Signed)
Your physician wants you to follow-up in: 6 months with Dr Berry. You will receive a reminder letter in the mail two months in advance. If you don't receive a letter, please call our office to schedule the follow-up appointment.  

## 2015-05-05 ENCOUNTER — Telehealth: Payer: Self-pay | Admitting: Physician Assistant

## 2015-05-05 NOTE — Telephone Encounter (Signed)
Patient called answering service this AM b/c he took 2 Coreg this AM by mistake.  His wife answered the phone when I called b/c the patient was in the shower.  She tells me that he is feeling well without complaints.    I advised her to have her husband to check his BP before his PM medicines.  If it is lower than usual, hold Lisinopril and Coreg tonight.  He should rest some today and drink extra fluids as well.  She agrees with this plan.  Richardson Dopp, PA-C   05/05/2015 8:56 AM

## 2015-06-01 ENCOUNTER — Telehealth: Payer: Self-pay | Admitting: Cardiovascular Disease

## 2015-06-01 DIAGNOSIS — I959 Hypotension, unspecified: Secondary | ICD-10-CM | POA: Diagnosis not present

## 2015-06-01 DIAGNOSIS — J029 Acute pharyngitis, unspecified: Secondary | ICD-10-CM | POA: Diagnosis not present

## 2015-06-01 DIAGNOSIS — J069 Acute upper respiratory infection, unspecified: Secondary | ICD-10-CM | POA: Diagnosis not present

## 2015-06-01 DIAGNOSIS — Z6822 Body mass index (BMI) 22.0-22.9, adult: Secondary | ICD-10-CM | POA: Diagnosis not present

## 2015-06-01 DIAGNOSIS — R008 Other abnormalities of heart beat: Secondary | ICD-10-CM | POA: Diagnosis not present

## 2015-06-01 NOTE — Telephone Encounter (Signed)
Spoke with patient and informed him he can take zyrtec, claritin, benadryl, mucinex without the -D or -DM in the name. He states he was exposed to strep throat over the weekend and he was advised to consult PCP or go to urgent care.

## 2015-06-01 NOTE — Telephone Encounter (Signed)
Pt called in stating that he has come down with a really bad cold and would like to know if he needs to take any procautions considering that he just had a heart attack 7 months ago. Please call  Thanks

## 2015-06-04 ENCOUNTER — Telehealth: Payer: Self-pay | Admitting: Cardiovascular Disease

## 2015-06-04 NOTE — Telephone Encounter (Signed)
Records received from [facility] for appointment with Dr Gwenlyn Found on 06/05/15.  Records given to [facility] (medical records) for Dr Kennon Holter schedule on 06/05/15. lp

## 2015-06-05 ENCOUNTER — Ambulatory Visit (INDEPENDENT_AMBULATORY_CARE_PROVIDER_SITE_OTHER): Payer: Medicare Other | Admitting: Cardiovascular Disease

## 2015-06-05 ENCOUNTER — Encounter: Payer: Self-pay | Admitting: Cardiovascular Disease

## 2015-06-05 VITALS — BP 84/42 | HR 69 | Ht 68.5 in | Wt 144.3 lb

## 2015-06-05 DIAGNOSIS — R9431 Abnormal electrocardiogram [ECG] [EKG]: Secondary | ICD-10-CM | POA: Diagnosis not present

## 2015-06-05 DIAGNOSIS — E785 Hyperlipidemia, unspecified: Secondary | ICD-10-CM

## 2015-06-05 DIAGNOSIS — I959 Hypotension, unspecified: Secondary | ICD-10-CM | POA: Insufficient documentation

## 2015-06-05 DIAGNOSIS — I241 Dressler's syndrome: Secondary | ICD-10-CM | POA: Diagnosis not present

## 2015-06-05 DIAGNOSIS — I9589 Other hypotension: Secondary | ICD-10-CM

## 2015-06-05 MED ORDER — BLOOD PRESSURE MONITOR KIT: PACK | Status: AC

## 2015-06-05 NOTE — Patient Instructions (Signed)
Medication Instructions:  Your physician recommends that you continue on your current medications as directed. Please refer to the Current Medication list given to you today.   Labwork: none  Testing/Procedures: none  Follow-Up: We request that you follow-up in: 6-8 weeks with an extender and in 4-6 months with Dr Andria Rhein will receive a reminder letter in the mail two months in advance. If you don't receive a letter, please call our office to schedule the follow-up appointment.    Any Other Special Instructions Will Be Listed Below (If Applicable).

## 2015-06-05 NOTE — Assessment & Plan Note (Signed)
Pedro Pope returns today after last being seen one month ago with more pronounced hypotension. His blood pressure a month ago was 96/54. He does have moderate LV dysfunction/ischemic cardiomyopathy on lisinopril and carvedilol. He saw his primary care physician yesterday who noted he was even more hypotensive and bradycardic. He has been feeling somewhat weak. He has lost weight as result of a very strict diet. He has eliminated salt.  Dr. [name] discontinued his lisinopril and cut his carvedilol and half. Today his blood pressure is 80/62 with a pulse of 69. His EKG shows sinus rhythm with PVCs. I've asked him to mildly liberalize his diet. He can remain off his lisinopril and continue carvedilol once a day (3.125 mg). He will see mid-level provider back in 6-8 weeks and me back in 4-6 months. I've asked him to monitor his blood pressure on a daily basis.

## 2015-06-05 NOTE — Progress Notes (Signed)
06/05/2015 Pedro Pope.   08/04/1941  [id number]  Primary Physician Precious Reel, MD Primary Cardiologist: Lorretta Harp MD [name]   HPI:  Pedro Pope is a delightful 74 year old thin appearing very Caucasian male father of 51, grandfather 7 grandchildren and great grandfather of 74 great grandchildren . He is a patient of [name]'s. I last saw him in the office 01/19/15. 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 He basically had no risk factors prior to his presentation. He had an acute anterolateral infarction. By direct intervention by myself 10/30/14 with a drug-eluting stent in his proximal LAD. He had a left dominant system and had moderate proximal AV groove circumflex disease with high-grade nondominant RCA disease. Ejection fraction was in the 40-45% range with moderate anteroapical hypokinesia. He was discharged home 2 days after admission and was admitted several days later with A. Fib with RVR which she was symptomatic. His EKG showed changes and he has converted to sinus rhythm. Consideration was given to oral anticoagulation over the risks of bleeding were thought to outweigh the benefits. He is wearing an event monitor and has noticed several episodes of brief PAF. His beta blocker was discontinued because of bradycardia and hypotension. Recent Myoview showed dense scar in the LAD territory and 2-D echo revealed an EF of 30-35%. He participated in cardiac rehabilitation and is relatively asymptomatic. His most recent 2-D echo performed 04/19/15 showed improvement in his LV function up to the 40-45% range. I did send him to see Dr. Rayann Heman for consideration of ICD therapy and to discuss his A. Fib. He was ultimately placed on Xarelto oral anti-coagulation and his Luna Kitchens was transitioned to Plavix to mitigate his bleeding risk. ssince I saw him in the office 04/24/15 he has been somewhat more hypotensive than usual. He was seen in  the office by Dr. [name] yesterday who noted an even more so pronounced low blood pressure and bradycardia. His meds were adjusted and he seen back here today in follow-up.   Current Outpatient Prescriptions  Medication Sig Dispense Refill  . carvedilol (COREG) 3.125 MG tablet Take 3.125 mg by mouth daily. Pt takes half of 3.187m tablet daily, pt takes a whole tablet only when blood pressure gets over 110    . colchicine 0.6 MG tablet Take 1 tablet (0.6 mg total) by mouth daily. (Patient taking differently: Take 0.6 mg by mouth as needed. AS NEEDED FOR GOUT.) 30 tablet 3  . Ascorbic Acid (VITAMIN C) 500 MG tablet Take 500 mg by mouth daily.      .Marland Kitchenatorvastatin (LIPITOR) 40 MG tablet Take 1 tablet (40 mg total) by mouth daily at 6 PM. 30 tablet 11  . azithromycin (ZITHROMAX) 250 MG tablet Take 250 mg by mouth daily.    . Blood Pressure Monitor KIT For daily blood pressure monitoring;  Arm Cuff 1 each 0  . clopidogrel (PLAVIX) 75 MG tablet Take 1 tablet (75 mg total) by mouth daily. 30 tablet 5  . nitroGLYCERIN (NITROSTAT) 0.4 MG SL tablet Place 1 tablet (0.4 mg total) under the tongue every 5 (five) minutes as needed for chest pain. 25 tablet 5  . rivaroxaban (XARELTO) 20 MG TABS tablet Take 1 tablet by mouth daily with supper or largest meal of the day 30 tablet 5   No current facility-administered medications for this visit.    Allergies  Allergen Reactions  . Penicillins Anaphylaxis  . Corn Syrup [Glucose]  Other (See Comments)    High fructose corn syrup causes gout  . Aspirin Adult Low [Aspirin] Itching and Rash    Dr. [name] suspects that long term caused a rash, short term use appears to be ok    Social History   Social History  . Marital Status: Married    Spouse Name: N/A  . Number of Children: N/A  . Years of Education: N/A   Occupational History  . Not on file.   Social History Main Topics  . Smoking status: Former Smoker    Quit date: 08/18/1992  . Smokeless  tobacco: Not on file  . Alcohol Use: Yes     Comment: social  . Drug Use: No  . Sexual Activity: Not on file   Other Topics Concern  . Not on file   Social History Narrative   Pt lives in [place] with spouse.  Realtor.  Retired Environmental education officer     Review of Systems: General: negative for chills, fever, night sweats or weight changes.  Cardiovascular: negative for chest pain, dyspnea on exertion, edema, orthopnea, palpitations, paroxysmal nocturnal dyspnea or shortness of breath Dermatological: negative for rash Respiratory: negative for cough or wheezing Urologic: negative for hematuria Abdominal: negative for nausea, vomiting, diarrhea, bright red blood per rectum, melena, or hematemesis Neurologic: negative for visual changes, syncope, or dizziness All other systems reviewed and are otherwise negative except as noted above.    Blood pressure 84/42, pulse 69, height 5' 8.5" (1.74 m), weight 144 lb 4.8 oz (65.454 kg).  General appearance: alert and no distress Neck: no adenopathy, no carotid bruit, no JVD, supple, symmetrical, trachea midline and thyroid not enlarged, symmetric, no tenderness/mass/nodules Lungs: clear to auscultation bilaterally Heart: regular rate and rhythm, S1, S2 normal, no murmur, click, rub or gallop Extremities: extremities normal, atraumatic, no cyanosis or edema  EKG normal sinus rhythm at 69 with frequent PVCs. He did have poor R-wave progression consistent with prior anterior wall myocardial infarction. I personally reviewed this EKG  ASSESSMENT AND PLAN:   Low blood pressure [name] returns today after last being seen one month ago with more pronounced hypotension. His blood pressure a month ago was 96/54. He does have moderate LV dysfunction/ischemic cardiomyopathy on lisinopril and carvedilol. He saw his primary care physician yesterday who noted he was even more hypotensive and bradycardic. He has been feeling somewhat weak. He has lost weight as  result of a very strict diet. He has eliminated salt.  Dr. [name] discontinued his lisinopril and cut his carvedilol and half. Today his blood pressure is 80/62 with a pulse of 69. His EKG shows sinus rhythm with PVCs. I've asked him to mildly liberalize his diet. He can remain off his lisinopril and continue carvedilol once a day (3.125 mg). He will see mid-level provider back in 6-8 weeks and me back in 4-6 months. I've asked him to monitor his blood pressure on a daily basis.      Lorretta Harp MD FACP,FACC,FAHA, [facility] 06/05/2015 9:18 AM

## 2015-06-25 ENCOUNTER — Encounter (HOSPITAL_COMMUNITY)
Admission: RE | Admit: 2015-06-25 | Discharge: 2015-06-25 | Disposition: A | Discharge status: Home / Self Care | Payer: Self-pay | Source: Ambulatory Visit | Attending: Cardiovascular Disease | Admitting: Cardiovascular Disease

## 2015-06-25 DIAGNOSIS — I480 Paroxysmal atrial fibrillation: Secondary | ICD-10-CM | POA: Insufficient documentation

## 2015-06-27 ENCOUNTER — Encounter (HOSPITAL_COMMUNITY)
Admission: RE | Admit: 2015-06-27 | Discharge: 2015-06-27 | Disposition: A | Discharge status: Home / Self Care | Payer: Self-pay | Source: Ambulatory Visit | Attending: Cardiovascular Disease | Admitting: Cardiovascular Disease

## 2015-06-29 ENCOUNTER — Encounter (HOSPITAL_COMMUNITY)
Admission: RE | Admit: 2015-06-29 | Discharge: 2015-06-29 | Disposition: A | Discharge status: Home / Self Care | Payer: Self-pay | Source: Ambulatory Visit | Attending: Cardiovascular Disease | Admitting: Cardiovascular Disease

## 2015-07-02 ENCOUNTER — Encounter (HOSPITAL_COMMUNITY)
Admission: RE | Admit: 2015-07-02 | Discharge: 2015-07-02 | Disposition: A | Discharge status: Home / Self Care | Payer: Self-pay | Source: Ambulatory Visit | Attending: Cardiovascular Disease | Admitting: Cardiovascular Disease

## 2015-07-04 ENCOUNTER — Encounter (HOSPITAL_COMMUNITY)
Admission: RE | Admit: 2015-07-04 | Discharge: 2015-07-04 | Disposition: A | Discharge status: Home / Self Care | Payer: Self-pay | Source: Ambulatory Visit | Attending: Cardiovascular Disease | Admitting: Cardiovascular Disease

## 2015-07-06 ENCOUNTER — Encounter (HOSPITAL_COMMUNITY): Payer: Self-pay

## 2015-07-09 ENCOUNTER — Encounter (HOSPITAL_COMMUNITY)
Admission: RE | Admit: 2015-07-09 | Discharge: 2015-07-09 | Disposition: A | Discharge status: Home / Self Care | Payer: Self-pay | Source: Ambulatory Visit | Attending: Cardiovascular Disease | Admitting: Cardiovascular Disease

## 2015-07-11 ENCOUNTER — Encounter (HOSPITAL_COMMUNITY): Payer: Self-pay

## 2015-07-16 ENCOUNTER — Encounter (HOSPITAL_COMMUNITY): Payer: Self-pay

## 2015-07-17 ENCOUNTER — Ambulatory Visit (INDEPENDENT_AMBULATORY_CARE_PROVIDER_SITE_OTHER): Payer: Medicare Other | Admitting: Cardiology

## 2015-07-17 ENCOUNTER — Encounter: Payer: Self-pay | Admitting: Cardiology

## 2015-07-17 VITALS — BP 108/52 | HR 56 | Ht 68.5 in | Wt 147.4 lb

## 2015-07-17 DIAGNOSIS — I241 Dressler's syndrome: Secondary | ICD-10-CM | POA: Diagnosis not present

## 2015-07-17 DIAGNOSIS — I255 Ischemic cardiomyopathy: Secondary | ICD-10-CM

## 2015-07-17 DIAGNOSIS — I480 Paroxysmal atrial fibrillation: Secondary | ICD-10-CM

## 2015-07-17 DIAGNOSIS — R0989 Other specified symptoms and signs involving the circulatory and respiratory systems: Secondary | ICD-10-CM | POA: Diagnosis not present

## 2015-07-17 NOTE — Patient Instructions (Signed)
Your physician has requested that you have a carotid duplex. This test is an ultrasound of the carotid arteries in your neck. It looks at blood flow through these arteries that supply the brain with blood. Allow one hour for this exam. There are no restrictions or special instructions.  WHEN YOU HAVE BLOOD WORK DONE FOR DR. [name] PLEASE MAKE SURE THEY RUN A TSH, CMET, AND LIPID PROFILE.  Your physician recommends that you schedule a follow-up appointment in: [place]

## 2015-07-17 NOTE — Assessment & Plan Note (Signed)
10/30/2014 antl STEMI s/p DES to pLAD; residual 80 % proximal and 60% mid LCx diease No angina

## 2015-07-17 NOTE — Progress Notes (Addendum)
07/17/2015 Pedro Re Jr.   June 02, 1941  [id number]  Primary Physician Precious Reel, MD Primary Cardiologist: Dr Gwenlyn Found  HPI:  74 y.o. male with a history of recent anterolateral STEMI (s/p prox LAD DES 10/30/14), ischemic cardiomyopathy with his most recent EF 40-45% Sept 2016, and post MI PAF. He was ultimately taken off Brilinta and ASA and placed on Plavix and Xarelto secondary to documented PAF on Holter. Recently he has been having problems with low B/P. He has not had syncope. He has completed stage 2 cardiac rehab. He is in the office today for follow up.    Current Outpatient Prescriptions  Medication Sig Dispense Refill  . Ascorbic Acid (VITAMIN C) 500 MG tablet Take 500 mg by mouth daily.      Marland Kitchen atorvastatin (LIPITOR) 40 MG tablet Take 1 tablet (40 mg total) by mouth daily at 6 PM. 30 tablet 11  . Blood Pressure Monitor KIT For daily blood pressure monitoring;  Arm Cuff 1 each 0  . carvedilol (COREG) 3.125 MG tablet Take 3.125 mg by mouth daily.     . clopidogrel (PLAVIX) 75 MG tablet Take 1 tablet (75 mg total) by mouth daily. 30 tablet 5  . colchicine 0.6 MG tablet Take 1 tablet (0.6 mg total) by mouth daily. (Patient taking differently: Take 0.6 mg by mouth as needed. AS NEEDED FOR GOUT.) 30 tablet 3  . nitroGLYCERIN (NITROSTAT) 0.4 MG SL tablet Place 1 tablet (0.4 mg total) under the tongue every 5 (five) minutes as needed for chest pain. 25 tablet 5  . rivaroxaban (XARELTO) 20 MG TABS tablet Take 1 tablet by mouth daily with supper or largest meal of the day 30 tablet 5   No current facility-administered medications for this visit.    Allergies  Allergen Reactions  . Penicillins Anaphylaxis  . Corn Syrup [Glucose] Other (See Comments)    High fructose corn syrup causes gout  . Aspirin Adult Low [Aspirin] Itching and Rash    Dr. [name] suspects that long term caused a rash, short term use appears to be ok    Social History   Social History  . Marital Status:  Married    Spouse Name: N/A  . Number of Children: N/A  . Years of Education: N/A   Occupational History  . Not on file.   Social History Main Topics  . Smoking status: Former Smoker    Quit date: 08/18/1992  . Smokeless tobacco: Not on file  . Alcohol Use: Yes     Comment: social  . Drug Use: No  . Sexual Activity: Not on file   Other Topics Concern  . 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  Cardiovascular: negative for chest pain, dyspnea on exertion, edema, orthopnea, palpitations, paroxysmal nocturnal dyspnea or shortness of breath Dermatological: negative for rash Respiratory: negative for cough or wheezing Urologic: negative for hematuria Abdominal: negative for nausea, vomiting, diarrhea, bright red blood per rectum, melena, or hematemesis Neurologic: negative for visual changes, syncope, or dizziness All other systems reviewed and are otherwise negative except as noted above.    Blood pressure 108/52, pulse 56, height 5' 8.5" (1.74 m), weight 147 lb 7 oz (66.877 kg).  General appearance: alert, cooperative and no distress Neck: no JVD and Lt carotid bruit Lungs: clear to auscultation bilaterally  Heart: regular rate and rhythm, S1, S2 normal, no murmur, click, rub or gallop Extremities: extremities normal, atraumatic, no cyanosis or edema Pulses: 2+ and symmetric Skin: Skin color, texture, turgor normal. No rashes or lesions Neurologic: Grossly normal   ASSESSMENT AND PLAN:   CAD S/P percutaneous coronary angioplasty 10/30/2014 antl STEMI s/p DES to pLAD; residual 80 % proximal and 60% mid LCx diease No angina  Ischemic cardiomyopathy 2D ECHO Sept16  Showed improved  EF to 40-45%   PAF post MI CHADS VASc = 3. Occasional irregular beats but no sustained tachycardia  Hyperlipidemia He needs CMET  and fasting lipids when he sees his PCP  Low blood pressure Intolerant to ACE, currently tolerating low dose Coreg.   PLAN  He is doing well from a cardiac standpoint. Same cardiac Rx. Reassurance. He should have a f/u TSH, CMET and lipid panel when he sees his PCP in a month or so. His TSH was mildly abnormal in March and his last lipid panel doesn't make sense- (chol 74- LDL 23 ). Pedro Pope has a Lt carotid bruit on exam, will check carotid dopplers.   Erlene Quan PA-C 07/17/2015 3:39 PM

## 2015-07-17 NOTE — Assessment & Plan Note (Signed)
He needs CMET and fasting lipids when he sees his PCP

## 2015-07-17 NOTE — Assessment & Plan Note (Addendum)
CHADS VASc = 3. Occasional irregular beats but no sustained tachycardia

## 2015-07-17 NOTE — Assessment & Plan Note (Signed)
2D ECHO Sept16  Showed improved  EF to 40-45%

## 2015-07-17 NOTE — Assessment & Plan Note (Signed)
Intolerant to ACE, currently tolerating low dose Coreg.

## 2015-07-17 NOTE — Assessment & Plan Note (Signed)
Lt CA bruit on exam

## 2015-07-17 NOTE — Assessment & Plan Note (Deleted)
Lt CA bruit on exam

## 2015-07-18 ENCOUNTER — Encounter (HOSPITAL_COMMUNITY)
Admission: RE | Admit: 2015-07-18 | Discharge: 2015-07-18 | Disposition: A | Discharge status: Home / Self Care | Payer: Self-pay | Source: Ambulatory Visit | Attending: Cardiovascular Disease | Admitting: Cardiovascular Disease

## 2015-07-19 ENCOUNTER — Ambulatory Visit (HOSPITAL_COMMUNITY)
Admission: RE | Admit: 2015-07-19 | Discharge: 2015-07-19 | Disposition: A | Discharge status: Home / Self Care | Payer: Medicare Other | Source: Ambulatory Visit | Attending: Cardiovascular Disease | Admitting: Cardiovascular Disease

## 2015-07-19 DIAGNOSIS — I6523 Occlusion and stenosis of bilateral carotid arteries: Secondary | ICD-10-CM | POA: Insufficient documentation

## 2015-07-19 DIAGNOSIS — R7303 Prediabetes: Secondary | ICD-10-CM | POA: Diagnosis not present

## 2015-07-19 DIAGNOSIS — R0989 Other specified symptoms and signs involving the circulatory and respiratory systems: Secondary | ICD-10-CM | POA: Diagnosis not present

## 2015-07-20 ENCOUNTER — Encounter (HOSPITAL_COMMUNITY)
Admission: RE | Admit: 2015-07-20 | Discharge: 2015-07-20 | Disposition: A | Discharge status: Home / Self Care | Payer: Self-pay | Source: Ambulatory Visit | Attending: Cardiovascular Disease | Admitting: Cardiovascular Disease

## 2015-07-20 DIAGNOSIS — I480 Paroxysmal atrial fibrillation: Secondary | ICD-10-CM | POA: Insufficient documentation

## 2015-07-23 ENCOUNTER — Encounter (HOSPITAL_COMMUNITY)
Admission: RE | Admit: 2015-07-23 | Discharge: 2015-07-23 | Disposition: A | Discharge status: Home / Self Care | Payer: Self-pay | Source: Ambulatory Visit | Attending: Cardiovascular Disease | Admitting: Cardiovascular Disease

## 2015-07-25 ENCOUNTER — Encounter (HOSPITAL_COMMUNITY)
Admission: RE | Admit: 2015-07-25 | Discharge: 2015-07-25 | Disposition: A | Discharge status: Home / Self Care | Payer: Self-pay | Source: Ambulatory Visit | Attending: Cardiovascular Disease | Admitting: Cardiovascular Disease

## 2015-07-27 ENCOUNTER — Encounter (HOSPITAL_COMMUNITY)
Admission: RE | Admit: 2015-07-27 | Discharge: 2015-07-27 | Disposition: A | Discharge status: Home / Self Care | Payer: Self-pay | Source: Ambulatory Visit | Attending: Cardiovascular Disease | Admitting: Cardiovascular Disease

## 2015-07-30 ENCOUNTER — Other Ambulatory Visit: Payer: Self-pay | Admitting: Cardiovascular Disease

## 2015-07-30 ENCOUNTER — Encounter (HOSPITAL_COMMUNITY)
Admission: RE | Admit: 2015-07-30 | Discharge: 2015-07-30 | Disposition: A | Discharge status: Home / Self Care | Payer: Self-pay | Source: Ambulatory Visit | Attending: Cardiovascular Disease | Admitting: Cardiovascular Disease

## 2015-07-30 NOTE — Telephone Encounter (Signed)
REFILL 

## 2015-08-01 ENCOUNTER — Encounter (HOSPITAL_COMMUNITY): Payer: Self-pay

## 2015-08-03 ENCOUNTER — Encounter (HOSPITAL_COMMUNITY): Payer: Self-pay

## 2015-08-06 ENCOUNTER — Encounter (HOSPITAL_COMMUNITY): Payer: Self-pay

## 2015-08-08 ENCOUNTER — Encounter (HOSPITAL_COMMUNITY): Payer: Self-pay

## 2015-08-10 ENCOUNTER — Encounter (HOSPITAL_COMMUNITY)
Admission: RE | Admit: 2015-08-10 | Discharge: 2015-08-10 | Disposition: A | Discharge status: Home / Self Care | Payer: Self-pay | Source: Ambulatory Visit | Attending: Cardiovascular Disease | Admitting: Cardiovascular Disease

## 2015-08-10 ENCOUNTER — Other Ambulatory Visit: Payer: Self-pay | Admitting: Cardiovascular Disease

## 2015-08-10 NOTE — Telephone Encounter (Signed)
Rx request sent to pharmacy.  

## 2015-08-10 NOTE — Telephone Encounter (Signed)
°*  STAT* If patient is at the pharmacy, call can be transferred to refill team.   1. Which medications need to be refilled? (please list name of each medication and dose if known) Xarelto-Please call today  2. Which pharmacy/location (including street and city if local pharmacy) is medication to be sent to?[facility]-[phone]  3. Do they need a 30 day or 90 day supply? 30 and refills

## 2015-08-15 ENCOUNTER — Encounter (HOSPITAL_COMMUNITY)
Admission: RE | Admit: 2015-08-15 | Discharge: 2015-08-15 | Disposition: A | Discharge status: Home / Self Care | Payer: Self-pay | Source: Ambulatory Visit | Attending: Cardiovascular Disease | Admitting: Cardiovascular Disease

## 2015-08-17 ENCOUNTER — Encounter (HOSPITAL_COMMUNITY)
Admission: RE | Admit: 2015-08-17 | Discharge: 2015-08-17 | Disposition: A | Discharge status: Home / Self Care | Payer: Self-pay | Source: Ambulatory Visit | Attending: Cardiovascular Disease | Admitting: Cardiovascular Disease

## 2015-08-22 ENCOUNTER — Encounter (HOSPITAL_COMMUNITY)
Admission: RE | Admit: 2015-08-22 | Discharge: 2015-08-22 | Disposition: A | Discharge status: Home / Self Care | Payer: Self-pay | Source: Ambulatory Visit | Attending: Cardiovascular Disease | Admitting: Cardiovascular Disease

## 2015-08-22 DIAGNOSIS — I480 Paroxysmal atrial fibrillation: Secondary | ICD-10-CM | POA: Insufficient documentation

## 2015-08-24 ENCOUNTER — Encounter (HOSPITAL_COMMUNITY)
Admission: RE | Admit: 2015-08-24 | Discharge: 2015-08-24 | Disposition: A | Discharge status: Home / Self Care | Payer: Self-pay | Source: Ambulatory Visit | Attending: Cardiovascular Disease | Admitting: Cardiovascular Disease

## 2015-08-27 ENCOUNTER — Encounter (HOSPITAL_COMMUNITY): Payer: Self-pay

## 2015-08-29 ENCOUNTER — Encounter (HOSPITAL_COMMUNITY): Payer: Self-pay

## 2015-08-31 ENCOUNTER — Encounter (HOSPITAL_COMMUNITY)
Admission: RE | Admit: 2015-08-31 | Discharge: 2015-08-31 | Disposition: A | Discharge status: Home / Self Care | Payer: Self-pay | Source: Ambulatory Visit | Attending: Cardiovascular Disease | Admitting: Cardiovascular Disease

## 2015-09-03 ENCOUNTER — Encounter (HOSPITAL_COMMUNITY)
Admission: RE | Admit: 2015-09-03 | Discharge: 2015-09-03 | Disposition: A | Discharge status: Home / Self Care | Payer: Self-pay | Source: Ambulatory Visit | Attending: Cardiovascular Disease | Admitting: Cardiovascular Disease

## 2015-09-05 ENCOUNTER — Encounter (HOSPITAL_COMMUNITY)
Admission: RE | Admit: 2015-09-05 | Discharge: 2015-09-05 | Disposition: A | Discharge status: Home / Self Care | Payer: Self-pay | Source: Ambulatory Visit | Attending: Cardiovascular Disease | Admitting: Cardiovascular Disease

## 2015-09-07 ENCOUNTER — Encounter (HOSPITAL_COMMUNITY): Payer: Self-pay

## 2015-09-10 ENCOUNTER — Encounter (HOSPITAL_COMMUNITY)
Admission: RE | Admit: 2015-09-10 | Discharge: 2015-09-10 | Disposition: A | Discharge status: Home / Self Care | Payer: Self-pay | Source: Ambulatory Visit | Attending: Cardiovascular Disease | Admitting: Cardiovascular Disease

## 2015-09-12 ENCOUNTER — Encounter (HOSPITAL_COMMUNITY)
Admission: RE | Admit: 2015-09-12 | Discharge: 2015-09-12 | Disposition: A | Discharge status: Home / Self Care | Payer: Self-pay | Source: Ambulatory Visit | Attending: Cardiovascular Disease | Admitting: Cardiovascular Disease

## 2015-09-14 ENCOUNTER — Encounter (HOSPITAL_COMMUNITY)
Admission: RE | Admit: 2015-09-14 | Discharge: 2015-09-14 | Disposition: A | Discharge status: Home / Self Care | Payer: Self-pay | Source: Ambulatory Visit | Attending: Cardiovascular Disease | Admitting: Cardiovascular Disease

## 2015-09-17 ENCOUNTER — Encounter (HOSPITAL_COMMUNITY)
Admission: RE | Admit: 2015-09-17 | Discharge: 2015-09-17 | Disposition: A | Discharge status: Home / Self Care | Payer: Self-pay | Source: Ambulatory Visit | Attending: Cardiovascular Disease | Admitting: Cardiovascular Disease

## 2015-09-17 DIAGNOSIS — I2510 Atherosclerotic heart disease of native coronary artery without angina pectoris: Secondary | ICD-10-CM | POA: Diagnosis not present

## 2015-09-17 DIAGNOSIS — I509 Heart failure, unspecified: Secondary | ICD-10-CM | POA: Diagnosis not present

## 2015-09-17 DIAGNOSIS — M109 Gout, unspecified: Secondary | ICD-10-CM | POA: Diagnosis not present

## 2015-09-17 DIAGNOSIS — R739 Hyperglycemia, unspecified: Secondary | ICD-10-CM | POA: Diagnosis not present

## 2015-09-17 DIAGNOSIS — E781 Pure hyperglyceridemia: Secondary | ICD-10-CM | POA: Diagnosis not present

## 2015-09-17 DIAGNOSIS — R8290 Unspecified abnormal findings in urine: Secondary | ICD-10-CM | POA: Diagnosis not present

## 2015-09-19 ENCOUNTER — Encounter (HOSPITAL_COMMUNITY)
Admission: RE | Admit: 2015-09-19 | Discharge: 2015-09-19 | Disposition: A | Discharge status: Home / Self Care | Payer: Self-pay | Source: Ambulatory Visit | Attending: Cardiovascular Disease | Admitting: Cardiovascular Disease

## 2015-09-19 DIAGNOSIS — I480 Paroxysmal atrial fibrillation: Secondary | ICD-10-CM | POA: Insufficient documentation

## 2015-09-21 ENCOUNTER — Encounter (HOSPITAL_COMMUNITY): Payer: Self-pay

## 2015-09-21 DIAGNOSIS — C61 Malignant neoplasm of prostate: Secondary | ICD-10-CM | POA: Diagnosis not present

## 2015-09-24 ENCOUNTER — Other Ambulatory Visit: Payer: Self-pay | Admitting: Internal Medicine

## 2015-09-24 ENCOUNTER — Encounter (HOSPITAL_COMMUNITY)
Admission: RE | Admit: 2015-09-24 | Discharge: 2015-09-24 | Disposition: A | Discharge status: Home / Self Care | Payer: Self-pay | Source: Ambulatory Visit | Attending: Cardiovascular Disease | Admitting: Cardiovascular Disease

## 2015-09-24 DIAGNOSIS — Z6821 Body mass index (BMI) 21.0-21.9, adult: Secondary | ICD-10-CM | POA: Diagnosis not present

## 2015-09-24 DIAGNOSIS — I4891 Unspecified atrial fibrillation: Secondary | ICD-10-CM | POA: Diagnosis not present

## 2015-09-24 DIAGNOSIS — I255 Ischemic cardiomyopathy: Secondary | ICD-10-CM | POA: Diagnosis not present

## 2015-09-24 DIAGNOSIS — I9589 Other hypotension: Secondary | ICD-10-CM | POA: Diagnosis not present

## 2015-09-24 DIAGNOSIS — Z23 Encounter for immunization: Secondary | ICD-10-CM | POA: Diagnosis not present

## 2015-09-24 DIAGNOSIS — Z1389 Encounter for screening for other disorder: Secondary | ICD-10-CM | POA: Diagnosis not present

## 2015-09-24 DIAGNOSIS — R008 Other abnormalities of heart beat: Secondary | ICD-10-CM | POA: Diagnosis not present

## 2015-09-24 DIAGNOSIS — H538 Other visual disturbances: Secondary | ICD-10-CM | POA: Diagnosis not present

## 2015-09-24 DIAGNOSIS — R319 Hematuria, unspecified: Secondary | ICD-10-CM

## 2015-09-24 DIAGNOSIS — I2510 Atherosclerotic heart disease of native coronary artery without angina pectoris: Secondary | ICD-10-CM | POA: Diagnosis not present

## 2015-09-24 DIAGNOSIS — Z0000 Encounter for general adult medical examination without abnormal findings: Secondary | ICD-10-CM | POA: Diagnosis not present

## 2015-09-24 DIAGNOSIS — I272 Other secondary pulmonary hypertension: Secondary | ICD-10-CM | POA: Diagnosis not present

## 2015-09-24 DIAGNOSIS — I509 Heart failure, unspecified: Secondary | ICD-10-CM | POA: Diagnosis not present

## 2015-09-26 ENCOUNTER — Encounter (HOSPITAL_COMMUNITY): Payer: Self-pay

## 2015-09-28 ENCOUNTER — Other Ambulatory Visit: Payer: Medicare Other

## 2015-09-28 ENCOUNTER — Encounter (HOSPITAL_COMMUNITY): Payer: Self-pay

## 2015-10-01 ENCOUNTER — Encounter (HOSPITAL_COMMUNITY): Payer: Self-pay

## 2015-10-02 ENCOUNTER — Ambulatory Visit
Admission: RE | Admit: 2015-10-02 | Discharge: 2015-10-02 | Disposition: A | Discharge status: Home / Self Care | Payer: Medicare Other | Source: Ambulatory Visit | Attending: Internal Medicine | Admitting: Internal Medicine

## 2015-10-02 DIAGNOSIS — R319 Hematuria, unspecified: Secondary | ICD-10-CM

## 2015-10-03 ENCOUNTER — Other Ambulatory Visit: Payer: Self-pay | Admitting: Physician Assistant

## 2015-10-03 ENCOUNTER — Encounter (HOSPITAL_COMMUNITY): Payer: Self-pay

## 2015-10-03 NOTE — Telephone Encounter (Signed)
Colchicine refill for gout deferred to PCP

## 2015-10-03 NOTE — Telephone Encounter (Signed)
Refill for colchicine refused - defer to PCP

## 2015-10-05 ENCOUNTER — Encounter (HOSPITAL_COMMUNITY): Payer: Self-pay

## 2015-10-08 ENCOUNTER — Encounter (HOSPITAL_COMMUNITY): Payer: Self-pay

## 2015-10-10 ENCOUNTER — Encounter (HOSPITAL_COMMUNITY): Payer: Self-pay

## 2015-10-12 ENCOUNTER — Encounter (HOSPITAL_COMMUNITY): Payer: Self-pay

## 2015-10-15 ENCOUNTER — Encounter (HOSPITAL_COMMUNITY)
Admission: RE | Admit: 2015-10-15 | Discharge: 2015-10-15 | Disposition: A | Discharge status: Home / Self Care | Payer: Self-pay | Source: Ambulatory Visit | Attending: Cardiovascular Disease | Admitting: Cardiovascular Disease

## 2015-10-16 DIAGNOSIS — R319 Hematuria, unspecified: Secondary | ICD-10-CM | POA: Diagnosis not present

## 2015-10-19 ENCOUNTER — Encounter (HOSPITAL_COMMUNITY)
Admission: RE | Admit: 2015-10-19 | Discharge: 2015-10-19 | Disposition: A | Discharge status: Home / Self Care | Payer: Self-pay | Source: Ambulatory Visit | Attending: Cardiovascular Disease | Admitting: Cardiovascular Disease

## 2015-10-19 DIAGNOSIS — Z954 Presence of other heart-valve replacement: Secondary | ICD-10-CM | POA: Insufficient documentation

## 2015-10-19 DIAGNOSIS — I213 ST elevation (STEMI) myocardial infarction of unspecified site: Secondary | ICD-10-CM | POA: Insufficient documentation

## 2015-10-22 ENCOUNTER — Encounter (HOSPITAL_COMMUNITY)
Admission: RE | Admit: 2015-10-22 | Discharge: 2015-10-22 | Disposition: A | Discharge status: Home / Self Care | Payer: Self-pay | Source: Ambulatory Visit | Attending: Cardiovascular Disease | Admitting: Cardiovascular Disease

## 2015-10-24 ENCOUNTER — Encounter (HOSPITAL_COMMUNITY)
Admission: RE | Admit: 2015-10-24 | Discharge: 2015-10-24 | Disposition: A | Discharge status: Home / Self Care | Payer: Self-pay | Source: Ambulatory Visit | Attending: Cardiovascular Disease | Admitting: Cardiovascular Disease

## 2015-10-26 ENCOUNTER — Encounter (HOSPITAL_COMMUNITY)
Admission: RE | Admit: 2015-10-26 | Discharge: 2015-10-26 | Disposition: A | Discharge status: Home / Self Care | Payer: Self-pay | Source: Ambulatory Visit | Attending: Cardiovascular Disease | Admitting: Cardiovascular Disease

## 2015-10-29 ENCOUNTER — Encounter (HOSPITAL_COMMUNITY)
Admission: RE | Admit: 2015-10-29 | Discharge: 2015-10-29 | Disposition: A | Discharge status: Home / Self Care | Payer: Self-pay | Source: Ambulatory Visit | Attending: Cardiovascular Disease | Admitting: Cardiovascular Disease

## 2015-10-31 ENCOUNTER — Encounter (HOSPITAL_COMMUNITY): Payer: Self-pay

## 2015-11-01 ENCOUNTER — Other Ambulatory Visit: Payer: Self-pay | Admitting: Physician Assistant

## 2015-11-01 NOTE — Telephone Encounter (Signed)
REFILL 

## 2015-11-02 ENCOUNTER — Encounter (HOSPITAL_COMMUNITY)
Admission: RE | Admit: 2015-11-02 | Discharge: 2015-11-02 | Disposition: A | Discharge status: Home / Self Care | Payer: Self-pay | Source: Ambulatory Visit | Attending: Cardiovascular Disease | Admitting: Cardiovascular Disease

## 2015-11-02 ENCOUNTER — Other Ambulatory Visit: Payer: Self-pay | Admitting: Physician Assistant

## 2015-11-02 NOTE — Telephone Encounter (Signed)
Rx request sent to pharmacy.  

## 2015-11-02 NOTE — Telephone Encounter (Signed)
Please review for refill. Thanks!  

## 2015-11-05 ENCOUNTER — Encounter (HOSPITAL_COMMUNITY)
Admission: RE | Admit: 2015-11-05 | Discharge: 2015-11-05 | Disposition: A | Discharge status: Home / Self Care | Payer: Self-pay | Source: Ambulatory Visit | Attending: Cardiovascular Disease | Admitting: Cardiovascular Disease

## 2015-11-07 ENCOUNTER — Encounter (HOSPITAL_COMMUNITY)
Admission: RE | Admit: 2015-11-07 | Discharge: 2015-11-07 | Disposition: A | Discharge status: Home / Self Care | Payer: Self-pay | Source: Ambulatory Visit | Attending: Cardiovascular Disease | Admitting: Cardiovascular Disease

## 2015-11-09 ENCOUNTER — Telehealth: Payer: Self-pay | Admitting: Internal Medicine

## 2015-11-09 ENCOUNTER — Encounter (HOSPITAL_COMMUNITY): Payer: Self-pay

## 2015-11-09 NOTE — Telephone Encounter (Signed)
On-Call Cardiology Phone Note  I was contacted by Pedro Pope, who states that he ran out of Xarelto and forgot to pick up his prescription today.  His last dose was approximately 10 PM yesterday.  He has continued taking Plavix and feels well.  The patient has a history of coronary artery disease with his most recent PCI being on 10/30/14, at which time he received a drug-eluting stent to the proximal LAD.  He also has a history of paroxysmal atrial fibrillation and has been maintained on Xarelto and Plavix.  Given that he is more than 12 months out from stent placement, I advised the patient that missing 1 dose of Xarelto carries with it a low risk.  I offered to call in a prescription to a 24 hour pharmacy, but the patient and I agreed that it would be reasonable for him to pick up his regular prescription at his pharmacy first thing tomorrow morning and take his next scheduled dose at that time.  I advised him to seek immediate medical attention if he develops any concerning symptoms such as chest pain.  I will forward this message to Dr. Gwenlyn Found for his review.  Nelva Bush Cardiology Fellow

## 2015-11-11 NOTE — Telephone Encounter (Signed)
That was approp advise Pedro Pope

## 2015-11-12 ENCOUNTER — Encounter (HOSPITAL_COMMUNITY)
Admission: RE | Admit: 2015-11-12 | Discharge: 2015-11-12 | Disposition: A | Discharge status: Home / Self Care | Payer: Self-pay | Source: Ambulatory Visit | Attending: Cardiovascular Disease | Admitting: Cardiovascular Disease

## 2015-11-14 ENCOUNTER — Encounter (HOSPITAL_COMMUNITY): Payer: Self-pay

## 2015-11-16 ENCOUNTER — Encounter (HOSPITAL_COMMUNITY)
Admission: RE | Admit: 2015-11-16 | Discharge: 2015-11-16 | Disposition: A | Discharge status: Home / Self Care | Payer: Self-pay | Source: Ambulatory Visit | Attending: Cardiovascular Disease | Admitting: Cardiovascular Disease

## 2015-11-19 ENCOUNTER — Encounter (HOSPITAL_COMMUNITY)
Admission: RE | Admit: 2015-11-19 | Discharge: 2015-11-19 | Disposition: A | Discharge status: Home / Self Care | Payer: Self-pay | Source: Ambulatory Visit | Attending: Cardiovascular Disease | Admitting: Cardiovascular Disease

## 2015-11-19 DIAGNOSIS — Z954 Presence of other heart-valve replacement: Secondary | ICD-10-CM | POA: Insufficient documentation

## 2015-11-19 DIAGNOSIS — I213 ST elevation (STEMI) myocardial infarction of unspecified site: Secondary | ICD-10-CM | POA: Insufficient documentation

## 2015-11-21 ENCOUNTER — Encounter (HOSPITAL_COMMUNITY): Payer: Self-pay

## 2015-11-23 ENCOUNTER — Encounter (HOSPITAL_COMMUNITY): Payer: Self-pay

## 2015-11-26 ENCOUNTER — Encounter (HOSPITAL_COMMUNITY)
Admission: RE | Admit: 2015-11-26 | Discharge: 2015-11-26 | Disposition: A | Discharge status: Home / Self Care | Payer: Self-pay | Source: Ambulatory Visit | Attending: Cardiovascular Disease | Admitting: Cardiovascular Disease

## 2015-11-28 ENCOUNTER — Encounter (HOSPITAL_COMMUNITY)
Admission: RE | Admit: 2015-11-28 | Discharge: 2015-11-28 | Disposition: A | Discharge status: Home / Self Care | Payer: Self-pay | Source: Ambulatory Visit | Attending: Cardiovascular Disease | Admitting: Cardiovascular Disease

## 2015-11-30 ENCOUNTER — Encounter (HOSPITAL_COMMUNITY): Payer: Self-pay

## 2015-12-03 ENCOUNTER — Encounter (HOSPITAL_COMMUNITY): Payer: Self-pay

## 2015-12-05 ENCOUNTER — Encounter (HOSPITAL_COMMUNITY)
Admission: RE | Admit: 2015-12-05 | Discharge: 2015-12-05 | Disposition: A | Discharge status: Home / Self Care | Payer: Self-pay | Source: Ambulatory Visit | Attending: Cardiovascular Disease | Admitting: Cardiovascular Disease

## 2015-12-07 ENCOUNTER — Encounter (HOSPITAL_COMMUNITY): Payer: Self-pay

## 2015-12-10 ENCOUNTER — Encounter (HOSPITAL_COMMUNITY)
Admission: RE | Admit: 2015-12-10 | Discharge: 2015-12-10 | Disposition: A | Discharge status: Home / Self Care | Payer: Self-pay | Source: Ambulatory Visit | Attending: Cardiovascular Disease | Admitting: Cardiovascular Disease

## 2015-12-12 ENCOUNTER — Encounter (HOSPITAL_COMMUNITY): Payer: Self-pay

## 2015-12-14 ENCOUNTER — Encounter (HOSPITAL_COMMUNITY): Payer: Self-pay

## 2015-12-17 ENCOUNTER — Encounter (HOSPITAL_COMMUNITY): Payer: Self-pay

## 2015-12-17 DIAGNOSIS — Z954 Presence of other heart-valve replacement: Secondary | ICD-10-CM | POA: Insufficient documentation

## 2015-12-17 DIAGNOSIS — I213 ST elevation (STEMI) myocardial infarction of unspecified site: Secondary | ICD-10-CM | POA: Insufficient documentation

## 2015-12-18 ENCOUNTER — Ambulatory Visit (INDEPENDENT_AMBULATORY_CARE_PROVIDER_SITE_OTHER): Payer: Medicare Other | Admitting: Cardiovascular Disease

## 2015-12-18 ENCOUNTER — Encounter: Payer: Self-pay | Admitting: Cardiovascular Disease

## 2015-12-18 VITALS — BP 106/52 | HR 61 | Ht 69.0 in | Wt 150.0 lb

## 2015-12-18 DIAGNOSIS — I480 Paroxysmal atrial fibrillation: Secondary | ICD-10-CM

## 2015-12-18 DIAGNOSIS — I255 Ischemic cardiomyopathy: Secondary | ICD-10-CM | POA: Diagnosis not present

## 2015-12-18 DIAGNOSIS — I2109 ST elevation (STEMI) myocardial infarction involving other coronary artery of anterior wall: Secondary | ICD-10-CM | POA: Diagnosis not present

## 2015-12-18 DIAGNOSIS — R0989 Other specified symptoms and signs involving the circulatory and respiratory systems: Secondary | ICD-10-CM

## 2015-12-18 DIAGNOSIS — E785 Hyperlipidemia, unspecified: Secondary | ICD-10-CM | POA: Diagnosis not present

## 2015-12-18 NOTE — Assessment & Plan Note (Signed)
History of PAF post MI currently on Xarelto maintaining sinus rhythm.

## 2015-12-18 NOTE — Assessment & Plan Note (Signed)
History of soft carotid bruit with normal carotid Doppler studies

## 2015-12-18 NOTE — Assessment & Plan Note (Signed)
History of hyperlipidemia on statin therapy followed by his PCP 

## 2015-12-18 NOTE — Progress Notes (Signed)
12/18/2015 Pedro Pope.   12-24-1940  [id number]  Primary Physician Pedro Reel, MD Primary Cardiologist: Pedro Harp MD Pedro Pope   HPI:  Pedro Pope is a delightful 75 year old thin appearing very Caucasian male father of 70, grandfather 7 grandchildren and great grandfather of 16 great grandchildren . He is a patient of Pedro Pope's. I last saw him in the office 06/05/15. 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 He basically had no risk factors prior to his presentation. He had an acute anterolateral infarction. By direct intervention by myself 10/30/14 with a drug-eluting stent in his proximal LAD. He had a left dominant system and had moderate proximal AV groove circumflex disease with high-grade nondominant RCA disease. Ejection fraction was in the 40-45% range with moderate anteroapical hypokinesia. He was discharged home 2 days after admission and was admitted several days later with A. Fib with RVR which she was symptomatic. His EKG showed changes and he has converted to sinus rhythm. Consideration was given to oral anticoagulation over the risks of bleeding were thought to outweigh the benefits. He is wearing an event monitor and has noticed several episodes of brief PAF. His beta blocker was discontinued because of bradycardia and hypotension. Recent Myoview showed dense scar in the LAD territory and 2-D echo revealed an EF of 30-35%. He participated in cardiac rehabilitation and is relatively asymptomatic. His most recent 2-D echo performed 04/19/15 showed improvement in his LV function up to the 40-45% range. I did send him to see Pedro Pope for consideration of ICD therapy and to discuss his A. Fib. He was ultimately placed on Xarelto oral anti-coagulation and his Pedro Pope was transitioned to Plavix to mitigate his bleeding risk. ssince I saw him in the office 04/24/15 he has been somewhat more hypotensive than usual. His blood  pressure medicines have been adjusted because of symptomatic hypotension which does not seem to be a problem recently. He does occasionally have "skipped beats"    Current Outpatient Prescriptions  Medication Sig Dispense Refill  . Ascorbic Acid (VITAMIN C) 500 MG tablet Take 500 mg by mouth daily.      Marland Kitchen atorvastatin (LIPITOR) 40 MG tablet TAKE ONE TABLET BY MOUTH ONCE DAILY AT  6  PM 30 tablet 1  . Blood Pressure Monitor KIT For daily blood pressure monitoring;  Arm Cuff 1 each 0  . carvedilol (COREG) 3.125 MG tablet Take 3.125 mg by mouth daily.     . clopidogrel (PLAVIX) 75 MG tablet TAKE ONE TABLET BY MOUTH ONCE DAILY 30 tablet 5  . colchicine 0.6 MG tablet Take 1 tablet (0.6 mg total) by mouth daily. (Patient taking differently: Take 0.6 mg by mouth as needed. AS NEEDED FOR GOUT.) 30 tablet 3  . NITROSTAT 0.4 MG SL tablet DISSOLVE ONE TABLET UNDER THE TONGUE EVERY 5 MINUTES AS NEEDED FOR CHEST PAIN. 25 tablet 4  . XARELTO 20 MG TABS tablet TAKE ONE TABLET BY MOUTH ONCE DAILY WITH  SUPPER OR LARGEST MEAL OF THE DAY 30 tablet 5   No current facility-administered medications for this visit.    Allergies  Allergen Reactions  . Penicillins Anaphylaxis  . Corn Syrup [Glucose] Other (See Comments)    High fructose corn syrup causes gout  . Aspirin Adult Low [Aspirin] Itching and Rash    Pedro Pope suspects that long term caused a rash, short term use appears to be ok    Social History  Social History  . Marital Status: Married    Spouse Name: N/A  . Number of Children: N/A  . Years of Education: N/A   Occupational History  . Not on file.   Social History Main Topics  . Smoking status: Former Smoker    Quit date: 08/18/1992  . Smokeless tobacco: Not on file  . Alcohol Use: Yes     Comment: social  . Drug Use: No  . Sexual Activity: Not on file   Other Topics Concern  . Not on file   Social History Narrative   Pt lives in [place] with spouse.  Realtor.  Retired  Environmental education officer     Review of Systems: General: negative for chills, fever, night sweats or weight changes.  Cardiovascular: negative for chest pain, dyspnea on exertion, edema, orthopnea, palpitations, paroxysmal nocturnal dyspnea or shortness of breath Dermatological: negative for rash Respiratory: negative for cough or wheezing Urologic: negative for hematuria Abdominal: negative for nausea, vomiting, diarrhea, bright red blood per rectum, melena, or hematemesis Neurologic: negative for visual changes, syncope, or dizziness All other systems reviewed and are otherwise negative except as noted above.    Blood pressure 106/52, pulse 61, height _0  (1.753 m), weight 150 lb (68.04 kg).  General appearance: alert and no distress Neck: no adenopathy, no JVD, supple, symmetrical, trachea midline, thyroid not enlarged, symmetric, no tenderness/mass/nodules and soft right carotid bruit Lungs: clear to auscultation bilaterally Heart: regular rate and rhythm, S1, S2 normal, no murmur, click, rub or gallop Extremities: extremities normal, atraumatic, no cyanosis or edema  EKG normal sinus rhythm at 61 with septal Q waves and nonspecific ST and T-wave changes. I personally reviewed this EKG  ASSESSMENT AND PLAN:   ST elevation myocardial infarction (STEMI) involving other coronary artery of anterior wall - STEMI s/p DES to proximal LAD; residual 80 % proximal and 60% mid LCx diease History of CAD status post acute anterolateral myocardial infarction treated by direct intervention 10/30/58 with a drug-eluting stent to his proximal LAD. He had a left dominant system with moderate proximal AV groove circumflex stenosis and high-grade nondominant RCA stenosis. His EF was in the 40-45% range at that time with moderate anteroapical hypokinesia. EF fell to 30% range after that ultimately improved recently to the 40-45% range. He remains on clopidogrel. He denies chest pain or shortness of  breath.  Hyperlipidemia History of hyperlipidemia on statin therapy followed by his PCP  Ischemic cardiomyopathy History of ischemic cardiac myopathy with an EF in the 40-45% range. He has no symptoms of congestive heart failure  PAF post MI History of PAF post MI currently on Xarelto maintaining sinus rhythm.   Carotid bruit History of soft carotid bruit with normal carotid Doppler studies      Pedro Harp MD [facility], [facility] 12/18/2015 1:47 PM

## 2015-12-18 NOTE — Assessment & Plan Note (Signed)
History of CAD status post acute anterolateral myocardial infarction treated by direct intervention 10/30/58 with a drug-eluting stent to his proximal LAD. He had a left dominant system with moderate proximal AV groove circumflex stenosis and high-grade nondominant RCA stenosis. His EF was in the 40-45% range at that time with moderate anteroapical hypokinesia. EF fell to 30% range after that ultimately improved recently to the 40-45% range. He remains on clopidogrel. He denies chest pain or shortness of breath.

## 2015-12-18 NOTE — Patient Instructions (Signed)
Medication Instructions:  Your physician recommends that you continue on your current medications as directed. Please refer to the Current Medication list given to you today.   Labwork: I will get your lab work from your Primary Care Physician.   Testing/Procedures: none  Follow-Up: Your physician wants you to follow-up in: 12 months with Dr. Gwenlyn Found. You will receive a reminder letter in the mail two months in advance. If you don't receive a letter, please call our office to schedule the follow-up appointment.   Any Other Special Instructions Will Be Listed Below (If Applicable).     If you need a refill on your cardiac medications before your next appointment, please call your pharmacy.

## 2015-12-18 NOTE — Assessment & Plan Note (Signed)
History of ischemic cardiac myopathy with an EF in the 40-45% range. He has no symptoms of congestive heart failure

## 2015-12-19 ENCOUNTER — Encounter (HOSPITAL_COMMUNITY): Payer: Self-pay

## 2015-12-21 ENCOUNTER — Encounter (HOSPITAL_COMMUNITY): Payer: Self-pay

## 2015-12-24 ENCOUNTER — Encounter (HOSPITAL_COMMUNITY)
Admission: RE | Admit: 2015-12-24 | Discharge: 2015-12-24 | Disposition: A | Discharge status: Home / Self Care | Payer: Self-pay | Source: Ambulatory Visit | Attending: Cardiovascular Disease | Admitting: Cardiovascular Disease

## 2015-12-25 DIAGNOSIS — H353131 Nonexudative age-related macular degeneration, bilateral, early dry stage: Secondary | ICD-10-CM | POA: Diagnosis not present

## 2015-12-25 DIAGNOSIS — H2513 Age-related nuclear cataract, bilateral: Secondary | ICD-10-CM | POA: Diagnosis not present

## 2015-12-26 ENCOUNTER — Encounter (HOSPITAL_COMMUNITY)
Admission: RE | Admit: 2015-12-26 | Discharge: 2015-12-26 | Disposition: A | Discharge status: Home / Self Care | Payer: Self-pay | Source: Ambulatory Visit | Attending: Cardiovascular Disease | Admitting: Cardiovascular Disease

## 2015-12-27 NOTE — Progress Notes (Deleted)
1300 dischargee instructions and prescription given to pt ans spouse . Verbalized understanding. Sent home Wheeled to lobby by NT

## 2015-12-28 ENCOUNTER — Encounter (HOSPITAL_COMMUNITY): Payer: Self-pay

## 2015-12-31 ENCOUNTER — Encounter: Payer: Self-pay | Admitting: Cardiovascular Disease

## 2015-12-31 ENCOUNTER — Encounter (HOSPITAL_COMMUNITY)
Admission: RE | Admit: 2015-12-31 | Discharge: 2015-12-31 | Disposition: A | Discharge status: Home / Self Care | Payer: Self-pay | Source: Ambulatory Visit | Attending: Cardiovascular Disease | Admitting: Cardiovascular Disease

## 2016-01-01 ENCOUNTER — Other Ambulatory Visit: Payer: Self-pay | Admitting: Physician Assistant

## 2016-01-01 NOTE — Telephone Encounter (Signed)
Rx Refill

## 2016-01-02 ENCOUNTER — Encounter (HOSPITAL_COMMUNITY)
Admission: RE | Admit: 2016-01-02 | Discharge: 2016-01-02 | Disposition: A | Discharge status: Home / Self Care | Payer: Self-pay | Source: Ambulatory Visit | Attending: Cardiovascular Disease | Admitting: Cardiovascular Disease

## 2016-01-04 ENCOUNTER — Encounter (HOSPITAL_COMMUNITY): Payer: Self-pay

## 2016-01-07 ENCOUNTER — Encounter (HOSPITAL_COMMUNITY): Payer: Self-pay

## 2016-01-07 ENCOUNTER — Telehealth: Payer: Self-pay | Admitting: Cardiovascular Disease

## 2016-01-07 NOTE — Telephone Encounter (Signed)
OK to interrupt Plavix (1 week prior) and Xarelto (3-4 days prior) to cataract surgery  JJB

## 2016-01-07 NOTE — Telephone Encounter (Signed)
New message     Please check mychart question from last week and call pt

## 2016-01-07 NOTE — Telephone Encounter (Signed)
Left msg for patient to call. 

## 2016-01-07 NOTE — Telephone Encounter (Signed)
Returned call. Pt states no rush on his part to do the cataract surgery. However, he has been scheduled for this on 5/31 and needs to know something this week as far as directions/clearance - or to cancel/postpone this procedure. Aware Dr. Gwenlyn Found out of office but may check messages - will route for recommendations.

## 2016-01-07 NOTE — Telephone Encounter (Signed)
Patient's msg from last week: From   Pedro Pope.    To   Lorretta Harp, MD    Sent   12/31/2015 10:32 AM        Dr. Jola Schmidt of [facility] says I am in need of cataract surgery on both of my eyes. I want to make certain that Dr, Gwenlyn Found is aware and approves of this surgery. I don't think I am in dire need to have this done now because I can still see well enough to function in my environment. I can delay if Dr. Gwenlyn Found thinks it's best that I wait awhile before having this surgery. Having the surgery may require me to skip my blood thinner medication prior to the surgery. I want to make the best decision for my overall health. Please advise.

## 2016-01-08 NOTE — Telephone Encounter (Signed)
Pt notified of Dr. Gwenlyn Found medication instructions. Pt very frustrated as if he needs to hold these medications for this time frame he must not even need it to begin with. Pt educated that these medication can be held for a short period of time to allow for a procedure. Pt stated he is not going to have procedure at this time. Told to contact his eye Dr. As it is his decision if he wants to have the surgery. Pt stated that it is unprofessional to put it on him to make the decision if he needs to have the surgery or not we are are the medical professional and should tell him. Pt educated that if his eye Dr. Alinda Dooms for the surgery it is likely needs and Dr. Gwenlyn Found has cleared him to hold these two medications for this amount of time. Pt said "thank you" and hung up.

## 2016-01-09 ENCOUNTER — Encounter (HOSPITAL_COMMUNITY): Payer: Self-pay

## 2016-01-11 ENCOUNTER — Encounter (HOSPITAL_COMMUNITY)
Admission: RE | Admit: 2016-01-11 | Discharge: 2016-01-11 | Disposition: A | Discharge status: Home / Self Care | Payer: Self-pay | Source: Ambulatory Visit | Attending: Cardiovascular Disease | Admitting: Cardiovascular Disease

## 2016-01-16 ENCOUNTER — Encounter (HOSPITAL_COMMUNITY): Payer: Self-pay

## 2016-01-18 ENCOUNTER — Encounter (HOSPITAL_COMMUNITY): Payer: Self-pay

## 2016-01-18 DIAGNOSIS — Z954 Presence of other heart-valve replacement: Secondary | ICD-10-CM | POA: Insufficient documentation

## 2016-01-18 DIAGNOSIS — I213 ST elevation (STEMI) myocardial infarction of unspecified site: Secondary | ICD-10-CM | POA: Insufficient documentation

## 2016-01-21 ENCOUNTER — Encounter (HOSPITAL_COMMUNITY)
Admission: RE | Admit: 2016-01-21 | Discharge: 2016-01-21 | Disposition: A | Discharge status: Home / Self Care | Payer: Self-pay | Source: Ambulatory Visit | Attending: Cardiovascular Disease | Admitting: Cardiovascular Disease

## 2016-01-23 ENCOUNTER — Encounter (HOSPITAL_COMMUNITY)
Admission: RE | Admit: 2016-01-23 | Discharge: 2016-01-23 | Disposition: A | Discharge status: Home / Self Care | Payer: Self-pay | Source: Ambulatory Visit | Attending: Cardiovascular Disease | Admitting: Cardiovascular Disease

## 2016-01-25 ENCOUNTER — Other Ambulatory Visit: Payer: Self-pay | Admitting: Cardiovascular Disease

## 2016-01-25 ENCOUNTER — Encounter (HOSPITAL_COMMUNITY): Payer: Self-pay

## 2016-01-25 NOTE — Telephone Encounter (Signed)
Rx request sent to pharmacy.  

## 2016-01-28 ENCOUNTER — Encounter (HOSPITAL_COMMUNITY)
Admission: RE | Admit: 2016-01-28 | Discharge: 2016-01-28 | Disposition: A | Discharge status: Home / Self Care | Payer: Self-pay | Source: Ambulatory Visit | Attending: Cardiovascular Disease | Admitting: Cardiovascular Disease

## 2016-01-30 ENCOUNTER — Encounter (HOSPITAL_COMMUNITY)
Admission: RE | Admit: 2016-01-30 | Discharge: 2016-01-30 | Disposition: A | Discharge status: Home / Self Care | Payer: Self-pay | Source: Ambulatory Visit | Attending: Cardiovascular Disease | Admitting: Cardiovascular Disease

## 2016-02-01 ENCOUNTER — Encounter (HOSPITAL_COMMUNITY)
Admission: RE | Admit: 2016-02-01 | Discharge: 2016-02-01 | Disposition: A | Discharge status: Home / Self Care | Payer: Self-pay | Source: Ambulatory Visit | Attending: Cardiovascular Disease | Admitting: Cardiovascular Disease

## 2016-02-04 ENCOUNTER — Encounter (HOSPITAL_COMMUNITY)
Admission: RE | Admit: 2016-02-04 | Discharge: 2016-02-04 | Disposition: A | Discharge status: Home / Self Care | Payer: Self-pay | Source: Ambulatory Visit | Attending: Cardiovascular Disease | Admitting: Cardiovascular Disease

## 2016-02-04 DIAGNOSIS — I213 ST elevation (STEMI) myocardial infarction of unspecified site: Secondary | ICD-10-CM

## 2016-02-05 ENCOUNTER — Other Ambulatory Visit: Payer: Self-pay | Admitting: *Deleted

## 2016-02-05 ENCOUNTER — Other Ambulatory Visit: Payer: Self-pay | Admitting: Cardiovascular Disease

## 2016-02-05 MED ORDER — RIVAROXABAN 20 MG PO TABS
20.0000 mg | ORAL_TABLET | Freq: Every day | ORAL | Status: DC
Start: 2016-02-05 — End: 2017-02-28

## 2016-02-06 ENCOUNTER — Encounter (HOSPITAL_COMMUNITY)
Admission: RE | Admit: 2016-02-06 | Discharge: 2016-02-06 | Disposition: A | Discharge status: Home / Self Care | Payer: Self-pay | Source: Ambulatory Visit | Attending: Cardiovascular Disease | Admitting: Cardiovascular Disease

## 2016-02-08 ENCOUNTER — Encounter (HOSPITAL_COMMUNITY): Payer: Self-pay

## 2016-02-11 ENCOUNTER — Encounter (HOSPITAL_COMMUNITY)
Admission: RE | Admit: 2016-02-11 | Discharge: 2016-02-11 | Disposition: A | Discharge status: Home / Self Care | Payer: Self-pay | Source: Ambulatory Visit | Attending: Cardiovascular Disease | Admitting: Cardiovascular Disease

## 2016-02-13 ENCOUNTER — Encounter (HOSPITAL_COMMUNITY)
Admission: RE | Admit: 2016-02-13 | Discharge: 2016-02-13 | Disposition: A | Discharge status: Home / Self Care | Payer: Self-pay | Source: Ambulatory Visit | Attending: Cardiovascular Disease | Admitting: Cardiovascular Disease

## 2016-02-15 ENCOUNTER — Encounter (HOSPITAL_COMMUNITY)
Admission: RE | Admit: 2016-02-15 | Discharge: 2016-02-15 | Disposition: A | Discharge status: Home / Self Care | Payer: Medicare Other | Source: Ambulatory Visit | Attending: Cardiovascular Disease | Admitting: Cardiovascular Disease

## 2016-02-18 ENCOUNTER — Encounter (HOSPITAL_COMMUNITY)
Admission: RE | Admit: 2016-02-18 | Discharge: 2016-02-18 | Disposition: A | Discharge status: Home / Self Care | Payer: Self-pay | Source: Ambulatory Visit | Attending: Cardiovascular Disease | Admitting: Cardiovascular Disease

## 2016-02-18 DIAGNOSIS — Z954 Presence of other heart-valve replacement: Secondary | ICD-10-CM | POA: Insufficient documentation

## 2016-02-18 DIAGNOSIS — I213 ST elevation (STEMI) myocardial infarction of unspecified site: Secondary | ICD-10-CM | POA: Insufficient documentation

## 2016-02-20 ENCOUNTER — Encounter (HOSPITAL_COMMUNITY): Payer: Self-pay

## 2016-02-22 ENCOUNTER — Encounter (HOSPITAL_COMMUNITY)
Admission: RE | Admit: 2016-02-22 | Discharge: 2016-02-22 | Disposition: A | Discharge status: Home / Self Care | Payer: Self-pay | Source: Ambulatory Visit | Attending: Cardiovascular Disease | Admitting: Cardiovascular Disease

## 2016-02-25 ENCOUNTER — Encounter (HOSPITAL_COMMUNITY)
Admission: RE | Admit: 2016-02-25 | Discharge: 2016-02-25 | Disposition: A | Discharge status: Home / Self Care | Payer: Self-pay | Source: Ambulatory Visit | Attending: Cardiovascular Disease | Admitting: Cardiovascular Disease

## 2016-02-27 ENCOUNTER — Encounter (HOSPITAL_COMMUNITY)
Admission: RE | Admit: 2016-02-27 | Discharge: 2016-02-27 | Disposition: A | Discharge status: Home / Self Care | Payer: Self-pay | Source: Ambulatory Visit | Attending: Cardiovascular Disease | Admitting: Cardiovascular Disease

## 2016-02-29 ENCOUNTER — Encounter (HOSPITAL_COMMUNITY)
Admission: RE | Admit: 2016-02-29 | Discharge: 2016-02-29 | Disposition: A | Discharge status: Home / Self Care | Payer: Self-pay | Source: Ambulatory Visit | Attending: Cardiovascular Disease | Admitting: Cardiovascular Disease

## 2016-02-29 IMAGING — NM NM MISC PROCEDURE
4 series · 24 of 24 positions shown · non-contrast
Comparison: none

[Series 1: wbr rest · 6.40mm/px · 6 of 64 frames shown]
[frame 6/64]
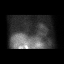
[frame 16/64]
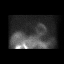
[frame 27/64]
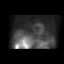
[frame 38/64]
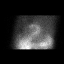
[frame 48/64]
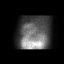
[frame 59/64]
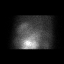

[Series 1: wbr_r-proj_st wbr rest · 6.40mm/px · 6 of 64 frames shown]
[frame 6/64]
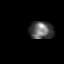
[frame 16/64]
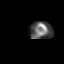
[frame 27/64]
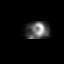
[frame 38/64]
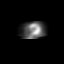
[frame 48/64]
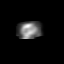
[frame 59/64]
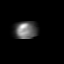

[Series 2: wbr stress ng · 6.40mm/px · 6 of 64 frames shown]
[frame 6/64]
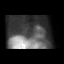
[frame 16/64]
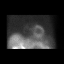
[frame 27/64]
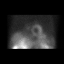
[frame 38/64]
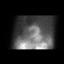
[frame 48/64]
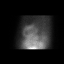
[frame 59/64]
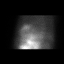

[Series 2: wbr_s-proj_st wbr stress ng · 6.40mm/px · 6 of 64 frames shown]
[frame 6/64]
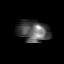
[frame 16/64]
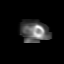
[frame 27/64]
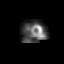
[frame 38/64]
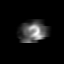
[frame 48/64]
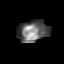
[frame 59/64]
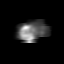

[24 of 24 positions shown; findings below may reference images not displayed]

Canned report from images found in remote index.

Refer to host system for actual result text.

## 2016-03-03 ENCOUNTER — Encounter (HOSPITAL_COMMUNITY)
Admission: RE | Admit: 2016-03-03 | Discharge: 2016-03-03 | Disposition: A | Discharge status: Home / Self Care | Payer: Self-pay | Source: Ambulatory Visit | Attending: Cardiovascular Disease | Admitting: Cardiovascular Disease

## 2016-03-05 ENCOUNTER — Encounter (HOSPITAL_COMMUNITY): Admission: RE | Admit: 2016-03-05 | Payer: Self-pay | Source: Ambulatory Visit

## 2016-03-07 ENCOUNTER — Encounter (HOSPITAL_COMMUNITY): Payer: Self-pay

## 2016-03-10 ENCOUNTER — Encounter (HOSPITAL_COMMUNITY)
Admission: RE | Admit: 2016-03-10 | Discharge: 2016-03-10 | Disposition: A | Discharge status: Home / Self Care | Payer: Self-pay | Source: Ambulatory Visit | Attending: Cardiovascular Disease | Admitting: Cardiovascular Disease

## 2016-03-12 ENCOUNTER — Encounter (HOSPITAL_COMMUNITY)
Admission: RE | Admit: 2016-03-12 | Discharge: 2016-03-12 | Disposition: A | Discharge status: Home / Self Care | Payer: Self-pay | Source: Ambulatory Visit | Attending: Cardiovascular Disease | Admitting: Cardiovascular Disease

## 2016-03-14 ENCOUNTER — Encounter (HOSPITAL_COMMUNITY): Payer: Self-pay

## 2016-03-17 ENCOUNTER — Encounter (HOSPITAL_COMMUNITY)
Admission: RE | Admit: 2016-03-17 | Discharge: 2016-03-17 | Disposition: A | Discharge status: Home / Self Care | Payer: Self-pay | Source: Ambulatory Visit | Attending: Cardiovascular Disease | Admitting: Cardiovascular Disease

## 2016-03-19 ENCOUNTER — Encounter (HOSPITAL_COMMUNITY): Payer: Self-pay

## 2016-03-19 DIAGNOSIS — I213 ST elevation (STEMI) myocardial infarction of unspecified site: Secondary | ICD-10-CM | POA: Insufficient documentation

## 2016-03-19 DIAGNOSIS — Z954 Presence of other heart-valve replacement: Secondary | ICD-10-CM | POA: Insufficient documentation

## 2016-03-21 ENCOUNTER — Encounter (HOSPITAL_COMMUNITY): Payer: Self-pay

## 2016-03-24 ENCOUNTER — Encounter (HOSPITAL_COMMUNITY)
Admission: RE | Admit: 2016-03-24 | Discharge: 2016-03-24 | Disposition: A | Discharge status: Home / Self Care | Payer: Self-pay | Source: Ambulatory Visit | Attending: Cardiovascular Disease | Admitting: Cardiovascular Disease

## 2016-03-25 DIAGNOSIS — Z6821 Body mass index (BMI) 21.0-21.9, adult: Secondary | ICD-10-CM | POA: Diagnosis not present

## 2016-03-25 DIAGNOSIS — I2510 Atherosclerotic heart disease of native coronary artery without angina pectoris: Secondary | ICD-10-CM | POA: Diagnosis not present

## 2016-03-25 DIAGNOSIS — I4891 Unspecified atrial fibrillation: Secondary | ICD-10-CM | POA: Diagnosis not present

## 2016-03-25 DIAGNOSIS — R030 Elevated blood-pressure reading, without diagnosis of hypertension: Secondary | ICD-10-CM | POA: Diagnosis not present

## 2016-03-25 DIAGNOSIS — H3530 Unspecified macular degeneration: Secondary | ICD-10-CM | POA: Diagnosis not present

## 2016-03-25 DIAGNOSIS — R7309 Other abnormal glucose: Secondary | ICD-10-CM | POA: Diagnosis not present

## 2016-03-25 DIAGNOSIS — I509 Heart failure, unspecified: Secondary | ICD-10-CM | POA: Diagnosis not present

## 2016-03-25 DIAGNOSIS — M109 Gout, unspecified: Secondary | ICD-10-CM | POA: Diagnosis not present

## 2016-03-25 DIAGNOSIS — I252 Old myocardial infarction: Secondary | ICD-10-CM | POA: Diagnosis not present

## 2016-03-25 DIAGNOSIS — N3289 Other specified disorders of bladder: Secondary | ICD-10-CM | POA: Diagnosis not present

## 2016-03-25 DIAGNOSIS — I255 Ischemic cardiomyopathy: Secondary | ICD-10-CM | POA: Diagnosis not present

## 2016-03-26 ENCOUNTER — Encounter (HOSPITAL_COMMUNITY)
Admission: RE | Admit: 2016-03-26 | Discharge: 2016-03-26 | Disposition: A | Discharge status: Home / Self Care | Payer: Self-pay | Source: Ambulatory Visit | Attending: Cardiovascular Disease | Admitting: Cardiovascular Disease

## 2016-03-28 ENCOUNTER — Encounter (HOSPITAL_COMMUNITY)
Admission: RE | Admit: 2016-03-28 | Discharge: 2016-03-28 | Disposition: A | Discharge status: Home / Self Care | Payer: Self-pay | Source: Ambulatory Visit | Attending: Cardiovascular Disease | Admitting: Cardiovascular Disease

## 2016-03-31 ENCOUNTER — Encounter (HOSPITAL_COMMUNITY): Payer: Self-pay

## 2016-04-01 ENCOUNTER — Telehealth: Payer: Self-pay | Admitting: *Deleted

## 2016-04-01 NOTE — Telephone Encounter (Signed)
Orders for [place]. [facility] signed by Dr Gwenlyn Found and faxed to [phone].

## 2016-04-02 ENCOUNTER — Encounter (HOSPITAL_COMMUNITY)
Admission: RE | Admit: 2016-04-02 | Discharge: 2016-04-02 | Disposition: A | Discharge status: Home / Self Care | Payer: Self-pay | Source: Ambulatory Visit | Attending: Cardiovascular Disease | Admitting: Cardiovascular Disease

## 2016-04-04 ENCOUNTER — Encounter (HOSPITAL_COMMUNITY)
Admission: RE | Admit: 2016-04-04 | Discharge: 2016-04-04 | Disposition: A | Discharge status: Home / Self Care | Payer: Self-pay | Source: Ambulatory Visit | Attending: Cardiovascular Disease | Admitting: Cardiovascular Disease

## 2016-04-07 ENCOUNTER — Encounter (HOSPITAL_COMMUNITY): Payer: Self-pay

## 2016-04-09 ENCOUNTER — Encounter (HOSPITAL_COMMUNITY)
Admission: RE | Admit: 2016-04-09 | Discharge: 2016-04-09 | Disposition: A | Discharge status: Home / Self Care | Payer: Self-pay | Source: Ambulatory Visit | Attending: Cardiovascular Disease | Admitting: Cardiovascular Disease

## 2016-04-11 ENCOUNTER — Encounter (HOSPITAL_COMMUNITY): Payer: Self-pay

## 2016-04-14 ENCOUNTER — Encounter (HOSPITAL_COMMUNITY)
Admission: RE | Admit: 2016-04-14 | Discharge: 2016-04-14 | Disposition: A | Discharge status: Home / Self Care | Payer: Self-pay | Source: Ambulatory Visit | Attending: Cardiovascular Disease | Admitting: Cardiovascular Disease

## 2016-04-16 ENCOUNTER — Encounter (HOSPITAL_COMMUNITY)
Admission: RE | Admit: 2016-04-16 | Discharge: 2016-04-16 | Disposition: A | Discharge status: Home / Self Care | Payer: Self-pay | Source: Ambulatory Visit | Attending: Cardiovascular Disease | Admitting: Cardiovascular Disease

## 2016-04-18 ENCOUNTER — Encounter (HOSPITAL_COMMUNITY)
Admission: RE | Admit: 2016-04-18 | Discharge: 2016-04-18 | Disposition: A | Discharge status: Home / Self Care | Payer: Self-pay | Source: Ambulatory Visit | Attending: Cardiovascular Disease | Admitting: Cardiovascular Disease

## 2016-04-18 DIAGNOSIS — Z954 Presence of other heart-valve replacement: Secondary | ICD-10-CM | POA: Insufficient documentation

## 2016-04-18 DIAGNOSIS — I213 ST elevation (STEMI) myocardial infarction of unspecified site: Secondary | ICD-10-CM | POA: Insufficient documentation

## 2016-04-23 ENCOUNTER — Encounter (HOSPITAL_COMMUNITY): Payer: Self-pay

## 2016-04-25 ENCOUNTER — Encounter (HOSPITAL_COMMUNITY): Payer: Self-pay

## 2016-04-28 ENCOUNTER — Encounter (HOSPITAL_COMMUNITY)
Admission: RE | Admit: 2016-04-28 | Discharge: 2016-04-28 | Disposition: A | Discharge status: Home / Self Care | Payer: Self-pay | Source: Ambulatory Visit | Attending: Cardiovascular Disease | Admitting: Cardiovascular Disease

## 2016-04-30 ENCOUNTER — Encounter (HOSPITAL_COMMUNITY): Payer: Self-pay

## 2016-05-02 ENCOUNTER — Encounter (HOSPITAL_COMMUNITY)
Admission: RE | Admit: 2016-05-02 | Discharge: 2016-05-02 | Disposition: A | Discharge status: Home / Self Care | Payer: Self-pay | Source: Ambulatory Visit | Attending: Cardiovascular Disease | Admitting: Cardiovascular Disease

## 2016-05-05 ENCOUNTER — Encounter (HOSPITAL_COMMUNITY)
Admission: RE | Admit: 2016-05-05 | Discharge: 2016-05-05 | Disposition: A | Discharge status: Home / Self Care | Payer: Self-pay | Source: Ambulatory Visit | Attending: Cardiovascular Disease | Admitting: Cardiovascular Disease

## 2016-05-07 ENCOUNTER — Encounter (HOSPITAL_COMMUNITY)
Admission: RE | Admit: 2016-05-07 | Discharge: 2016-05-07 | Disposition: A | Discharge status: Home / Self Care | Payer: Self-pay | Source: Ambulatory Visit | Attending: Cardiovascular Disease | Admitting: Cardiovascular Disease

## 2016-05-09 ENCOUNTER — Encounter (HOSPITAL_COMMUNITY): Payer: Self-pay

## 2016-05-12 ENCOUNTER — Encounter (HOSPITAL_COMMUNITY)
Admission: RE | Admit: 2016-05-12 | Discharge: 2016-05-12 | Disposition: A | Discharge status: Home / Self Care | Payer: Self-pay | Source: Ambulatory Visit | Attending: Cardiovascular Disease | Admitting: Cardiovascular Disease

## 2016-05-14 ENCOUNTER — Encounter (HOSPITAL_COMMUNITY)
Admission: RE | Admit: 2016-05-14 | Discharge: 2016-05-14 | Disposition: A | Discharge status: Home / Self Care | Payer: Self-pay | Source: Ambulatory Visit | Attending: Cardiovascular Disease | Admitting: Cardiovascular Disease

## 2016-05-16 ENCOUNTER — Encounter (HOSPITAL_COMMUNITY): Payer: Self-pay

## 2016-05-19 ENCOUNTER — Encounter (HOSPITAL_COMMUNITY)
Admission: RE | Admit: 2016-05-19 | Discharge: 2016-05-19 | Disposition: A | Discharge status: Home / Self Care | Payer: Self-pay | Source: Ambulatory Visit | Attending: Cardiovascular Disease | Admitting: Cardiovascular Disease

## 2016-05-19 DIAGNOSIS — I213 ST elevation (STEMI) myocardial infarction of unspecified site: Secondary | ICD-10-CM | POA: Insufficient documentation

## 2016-05-19 DIAGNOSIS — Z954 Presence of other heart-valve replacement: Secondary | ICD-10-CM | POA: Insufficient documentation

## 2016-05-21 ENCOUNTER — Encounter (HOSPITAL_COMMUNITY)
Admission: RE | Admit: 2016-05-21 | Discharge: 2016-05-21 | Disposition: A | Discharge status: Home / Self Care | Payer: Self-pay | Source: Ambulatory Visit | Attending: Cardiovascular Disease | Admitting: Cardiovascular Disease

## 2016-05-23 ENCOUNTER — Encounter (HOSPITAL_COMMUNITY): Payer: Self-pay

## 2016-05-26 ENCOUNTER — Encounter (HOSPITAL_COMMUNITY): Payer: Self-pay

## 2016-05-28 ENCOUNTER — Encounter (HOSPITAL_COMMUNITY)
Admission: RE | Admit: 2016-05-28 | Discharge: 2016-05-28 | Disposition: A | Discharge status: Home / Self Care | Payer: Self-pay | Source: Ambulatory Visit | Attending: Cardiovascular Disease | Admitting: Cardiovascular Disease

## 2016-05-30 ENCOUNTER — Encounter (HOSPITAL_COMMUNITY)
Admission: RE | Admit: 2016-05-30 | Discharge: 2016-05-30 | Disposition: A | Discharge status: Home / Self Care | Payer: Self-pay | Source: Ambulatory Visit | Attending: Cardiovascular Disease | Admitting: Cardiovascular Disease

## 2016-06-02 ENCOUNTER — Encounter (HOSPITAL_COMMUNITY)
Admission: RE | Admit: 2016-06-02 | Discharge: 2016-06-02 | Disposition: A | Discharge status: Home / Self Care | Payer: Self-pay | Source: Ambulatory Visit | Attending: Cardiovascular Disease | Admitting: Cardiovascular Disease

## 2016-06-04 ENCOUNTER — Encounter (HOSPITAL_COMMUNITY)
Admission: RE | Admit: 2016-06-04 | Discharge: 2016-06-04 | Disposition: A | Discharge status: Home / Self Care | Payer: Self-pay | Source: Ambulatory Visit | Attending: Cardiovascular Disease | Admitting: Cardiovascular Disease

## 2016-06-06 ENCOUNTER — Encounter (HOSPITAL_COMMUNITY)
Admission: RE | Admit: 2016-06-06 | Discharge: 2016-06-06 | Disposition: A | Discharge status: Home / Self Care | Payer: Self-pay | Source: Ambulatory Visit | Attending: Cardiovascular Disease | Admitting: Cardiovascular Disease

## 2016-06-09 ENCOUNTER — Encounter (HOSPITAL_COMMUNITY)
Admission: RE | Admit: 2016-06-09 | Discharge: 2016-06-09 | Disposition: A | Discharge status: Home / Self Care | Payer: Self-pay | Source: Ambulatory Visit | Attending: Cardiovascular Disease | Admitting: Cardiovascular Disease

## 2016-06-11 ENCOUNTER — Encounter (HOSPITAL_COMMUNITY)
Admission: RE | Admit: 2016-06-11 | Discharge: 2016-06-11 | Disposition: A | Discharge status: Home / Self Care | Payer: Self-pay | Source: Ambulatory Visit | Attending: Cardiovascular Disease | Admitting: Cardiovascular Disease

## 2016-06-13 ENCOUNTER — Encounter (HOSPITAL_COMMUNITY)
Admission: RE | Admit: 2016-06-13 | Discharge: 2016-06-13 | Disposition: A | Discharge status: Home / Self Care | Payer: Self-pay | Source: Ambulatory Visit | Attending: Cardiovascular Disease | Admitting: Cardiovascular Disease

## 2016-06-16 ENCOUNTER — Encounter (HOSPITAL_COMMUNITY)
Admission: RE | Admit: 2016-06-16 | Discharge: 2016-06-16 | Disposition: A | Discharge status: Home / Self Care | Payer: Self-pay | Source: Ambulatory Visit | Attending: Cardiovascular Disease | Admitting: Cardiovascular Disease

## 2016-06-18 ENCOUNTER — Encounter (HOSPITAL_COMMUNITY)
Admission: RE | Admit: 2016-06-18 | Discharge: 2016-06-18 | Disposition: A | Discharge status: Home / Self Care | Payer: Self-pay | Source: Ambulatory Visit | Attending: Cardiovascular Disease | Admitting: Cardiovascular Disease

## 2016-06-18 DIAGNOSIS — I213 ST elevation (STEMI) myocardial infarction of unspecified site: Secondary | ICD-10-CM | POA: Insufficient documentation

## 2016-06-18 DIAGNOSIS — Z954 Presence of other heart-valve replacement: Secondary | ICD-10-CM | POA: Insufficient documentation

## 2016-06-20 ENCOUNTER — Encounter (HOSPITAL_COMMUNITY)
Admission: RE | Admit: 2016-06-20 | Discharge: 2016-06-20 | Disposition: A | Discharge status: Home / Self Care | Payer: Self-pay | Source: Ambulatory Visit | Attending: Cardiovascular Disease | Admitting: Cardiovascular Disease

## 2016-06-23 ENCOUNTER — Ambulatory Visit: Payer: Self-pay | Admitting: *Deleted

## 2016-06-23 ENCOUNTER — Encounter (HOSPITAL_COMMUNITY)
Admission: RE | Admit: 2016-06-23 | Discharge: 2016-06-23 | Disposition: A | Discharge status: Home / Self Care | Payer: Self-pay | Source: Ambulatory Visit | Attending: Cardiovascular Disease | Admitting: Cardiovascular Disease

## 2016-06-25 ENCOUNTER — Encounter (HOSPITAL_COMMUNITY)
Admission: RE | Admit: 2016-06-25 | Discharge: 2016-06-25 | Disposition: A | Discharge status: Home / Self Care | Payer: Self-pay | Source: Ambulatory Visit | Attending: Cardiovascular Disease | Admitting: Cardiovascular Disease

## 2016-06-27 ENCOUNTER — Encounter (HOSPITAL_COMMUNITY)
Admission: RE | Admit: 2016-06-27 | Discharge: 2016-06-27 | Disposition: A | Discharge status: Home / Self Care | Payer: Self-pay | Source: Ambulatory Visit | Attending: Cardiovascular Disease | Admitting: Cardiovascular Disease

## 2016-06-30 ENCOUNTER — Encounter (HOSPITAL_COMMUNITY)
Admission: RE | Admit: 2016-06-30 | Discharge: 2016-06-30 | Disposition: A | Discharge status: Home / Self Care | Payer: Self-pay | Source: Ambulatory Visit | Attending: Cardiovascular Disease | Admitting: Cardiovascular Disease

## 2016-07-02 ENCOUNTER — Encounter (HOSPITAL_COMMUNITY)
Admission: RE | Admit: 2016-07-02 | Discharge: 2016-07-02 | Disposition: A | Discharge status: Home / Self Care | Payer: Self-pay | Source: Ambulatory Visit | Attending: Cardiovascular Disease | Admitting: Cardiovascular Disease

## 2016-07-04 ENCOUNTER — Encounter (HOSPITAL_COMMUNITY)
Admission: RE | Admit: 2016-07-04 | Discharge: 2016-07-04 | Disposition: A | Discharge status: Home / Self Care | Payer: Self-pay | Source: Ambulatory Visit | Attending: Cardiovascular Disease | Admitting: Cardiovascular Disease

## 2016-07-07 ENCOUNTER — Encounter (HOSPITAL_COMMUNITY): Payer: Self-pay

## 2016-07-09 ENCOUNTER — Encounter (HOSPITAL_COMMUNITY): Payer: Self-pay

## 2016-07-09 DIAGNOSIS — Z23 Encounter for immunization: Secondary | ICD-10-CM | POA: Diagnosis not present

## 2016-07-14 ENCOUNTER — Encounter (HOSPITAL_COMMUNITY)
Admission: RE | Admit: 2016-07-14 | Discharge: 2016-07-14 | Disposition: A | Discharge status: Home / Self Care | Payer: Self-pay | Source: Ambulatory Visit | Attending: Cardiovascular Disease | Admitting: Cardiovascular Disease

## 2016-07-16 ENCOUNTER — Encounter (HOSPITAL_COMMUNITY)
Admission: RE | Admit: 2016-07-16 | Discharge: 2016-07-16 | Disposition: A | Discharge status: Home / Self Care | Payer: Self-pay | Source: Ambulatory Visit | Attending: Cardiovascular Disease | Admitting: Cardiovascular Disease

## 2016-07-18 ENCOUNTER — Encounter (HOSPITAL_COMMUNITY): Payer: Self-pay

## 2016-07-21 ENCOUNTER — Encounter (HOSPITAL_COMMUNITY): Payer: Self-pay

## 2016-07-23 ENCOUNTER — Encounter (HOSPITAL_COMMUNITY): Payer: Self-pay

## 2016-07-25 ENCOUNTER — Encounter (HOSPITAL_COMMUNITY): Payer: Self-pay

## 2016-07-28 ENCOUNTER — Encounter (HOSPITAL_COMMUNITY): Payer: Self-pay

## 2016-07-30 ENCOUNTER — Encounter (HOSPITAL_COMMUNITY): Payer: Self-pay

## 2016-08-01 ENCOUNTER — Encounter (HOSPITAL_COMMUNITY): Payer: Self-pay

## 2016-08-04 ENCOUNTER — Encounter (HOSPITAL_COMMUNITY): Payer: Self-pay

## 2016-08-06 ENCOUNTER — Encounter (HOSPITAL_COMMUNITY): Payer: Self-pay

## 2016-08-08 ENCOUNTER — Encounter (HOSPITAL_COMMUNITY): Payer: Self-pay

## 2016-08-13 ENCOUNTER — Encounter (HOSPITAL_COMMUNITY): Payer: Self-pay

## 2016-08-15 ENCOUNTER — Encounter (HOSPITAL_COMMUNITY): Payer: Self-pay

## 2016-08-20 ENCOUNTER — Encounter (HOSPITAL_COMMUNITY): Payer: Self-pay

## 2016-08-20 ENCOUNTER — Other Ambulatory Visit: Payer: Self-pay | Admitting: *Deleted

## 2016-08-20 MED ORDER — CARVEDILOL 3.125 MG PO TABS: 3.1250 mg | ORAL_TABLET | Freq: Two times a day (BID) | ORAL | 1 refills | Status: DC

## 2016-08-22 ENCOUNTER — Encounter (HOSPITAL_COMMUNITY): Payer: Self-pay

## 2016-08-25 ENCOUNTER — Encounter (HOSPITAL_COMMUNITY): Payer: Self-pay

## 2016-08-27 ENCOUNTER — Encounter (HOSPITAL_COMMUNITY): Payer: Self-pay

## 2016-08-29 ENCOUNTER — Encounter (HOSPITAL_COMMUNITY): Payer: Self-pay

## 2016-09-01 ENCOUNTER — Encounter (HOSPITAL_COMMUNITY): Payer: Self-pay

## 2016-09-03 ENCOUNTER — Encounter (HOSPITAL_COMMUNITY): Payer: Self-pay

## 2016-09-05 ENCOUNTER — Encounter (HOSPITAL_COMMUNITY): Payer: Self-pay

## 2016-09-08 ENCOUNTER — Encounter (HOSPITAL_COMMUNITY): Payer: Self-pay

## 2016-09-10 ENCOUNTER — Encounter (HOSPITAL_COMMUNITY): Payer: Self-pay

## 2016-09-12 ENCOUNTER — Encounter (HOSPITAL_COMMUNITY): Payer: Self-pay

## 2016-09-15 ENCOUNTER — Encounter (HOSPITAL_COMMUNITY): Payer: Self-pay

## 2016-09-17 ENCOUNTER — Encounter (HOSPITAL_COMMUNITY): Payer: Self-pay

## 2016-09-18 DIAGNOSIS — I2510 Atherosclerotic heart disease of native coronary artery without angina pectoris: Secondary | ICD-10-CM | POA: Diagnosis not present

## 2016-09-18 DIAGNOSIS — E781 Pure hyperglyceridemia: Secondary | ICD-10-CM | POA: Diagnosis not present

## 2016-09-18 DIAGNOSIS — M109 Gout, unspecified: Secondary | ICD-10-CM | POA: Diagnosis not present

## 2016-09-18 DIAGNOSIS — R7309 Other abnormal glucose: Secondary | ICD-10-CM | POA: Diagnosis not present

## 2016-09-19 ENCOUNTER — Encounter (HOSPITAL_COMMUNITY): Payer: Self-pay

## 2016-09-22 ENCOUNTER — Encounter (HOSPITAL_COMMUNITY): Payer: Self-pay

## 2016-09-24 ENCOUNTER — Encounter (HOSPITAL_COMMUNITY): Payer: Self-pay

## 2016-09-26 ENCOUNTER — Encounter (HOSPITAL_COMMUNITY): Payer: Self-pay

## 2016-09-26 DIAGNOSIS — C61 Malignant neoplasm of prostate: Secondary | ICD-10-CM | POA: Diagnosis not present

## 2016-09-26 DIAGNOSIS — R399 Unspecified symptoms and signs involving the genitourinary system: Secondary | ICD-10-CM | POA: Diagnosis not present

## 2016-09-29 ENCOUNTER — Encounter (HOSPITAL_COMMUNITY): Payer: Self-pay

## 2016-09-30 DIAGNOSIS — E781 Pure hyperglyceridemia: Secondary | ICD-10-CM | POA: Diagnosis not present

## 2016-09-30 DIAGNOSIS — R7309 Other abnormal glucose: Secondary | ICD-10-CM | POA: Diagnosis not present

## 2016-09-30 DIAGNOSIS — I2789 Other specified pulmonary heart diseases: Secondary | ICD-10-CM | POA: Diagnosis not present

## 2016-09-30 DIAGNOSIS — Z0000 Encounter for general adult medical examination without abnormal findings: Secondary | ICD-10-CM | POA: Diagnosis not present

## 2016-09-30 DIAGNOSIS — I4891 Unspecified atrial fibrillation: Secondary | ICD-10-CM | POA: Diagnosis not present

## 2016-09-30 DIAGNOSIS — N3289 Other specified disorders of bladder: Secondary | ICD-10-CM | POA: Diagnosis not present

## 2016-09-30 DIAGNOSIS — I2510 Atherosclerotic heart disease of native coronary artery without angina pectoris: Secondary | ICD-10-CM | POA: Diagnosis not present

## 2016-09-30 DIAGNOSIS — Z6822 Body mass index (BMI) 22.0-22.9, adult: Secondary | ICD-10-CM | POA: Diagnosis not present

## 2016-09-30 DIAGNOSIS — I255 Ischemic cardiomyopathy: Secondary | ICD-10-CM | POA: Diagnosis not present

## 2016-09-30 DIAGNOSIS — R030 Elevated blood-pressure reading, without diagnosis of hypertension: Secondary | ICD-10-CM | POA: Diagnosis not present

## 2016-09-30 DIAGNOSIS — I509 Heart failure, unspecified: Secondary | ICD-10-CM | POA: Diagnosis not present

## 2016-09-30 DIAGNOSIS — Z1389 Encounter for screening for other disorder: Secondary | ICD-10-CM | POA: Diagnosis not present

## 2016-10-01 ENCOUNTER — Encounter (HOSPITAL_COMMUNITY): Payer: Self-pay

## 2016-10-02 DIAGNOSIS — Z1212 Encounter for screening for malignant neoplasm of rectum: Secondary | ICD-10-CM | POA: Diagnosis not present

## 2016-10-03 ENCOUNTER — Encounter (HOSPITAL_COMMUNITY): Payer: Self-pay

## 2016-10-06 ENCOUNTER — Encounter (HOSPITAL_COMMUNITY): Payer: Self-pay

## 2016-10-08 ENCOUNTER — Encounter (HOSPITAL_COMMUNITY): Payer: Self-pay

## 2016-10-10 ENCOUNTER — Encounter (HOSPITAL_COMMUNITY): Payer: Self-pay

## 2016-10-13 ENCOUNTER — Encounter (HOSPITAL_COMMUNITY): Payer: Self-pay

## 2016-10-14 DIAGNOSIS — C61 Malignant neoplasm of prostate: Secondary | ICD-10-CM | POA: Diagnosis not present

## 2016-10-15 ENCOUNTER — Encounter (HOSPITAL_COMMUNITY): Payer: Self-pay

## 2016-10-17 ENCOUNTER — Encounter (HOSPITAL_COMMUNITY): Payer: Self-pay

## 2016-10-20 ENCOUNTER — Encounter (HOSPITAL_COMMUNITY): Payer: Self-pay

## 2016-10-22 ENCOUNTER — Encounter (HOSPITAL_COMMUNITY): Payer: Self-pay

## 2016-10-24 ENCOUNTER — Encounter (HOSPITAL_COMMUNITY): Payer: Self-pay

## 2016-10-27 ENCOUNTER — Encounter (HOSPITAL_COMMUNITY): Payer: Self-pay

## 2016-10-28 ENCOUNTER — Telehealth: Payer: Self-pay | Admitting: Cardiovascular Disease

## 2016-10-28 NOTE — Telephone Encounter (Signed)
Call doesn't connect through when dialed, no means to leave VM.  Will seek Dr. Kennon Holter advice on any med holds/clearance for this dental procedure.

## 2016-10-28 NOTE — Telephone Encounter (Signed)
Will discuss w [facility] - pt had DES March 10/30/14. Dentist advising urgent procedure for bone graft and extraction w need to hold anticoagulation and antiplatelet agent. Rip Harbour spoke w dentist, informed her we would reach out to patient to give recommendations/med hold instructions prior to proc.

## 2016-10-28 NOTE — Telephone Encounter (Signed)
Request for surgical clearance:  What type of surgery is being performed?Tooth Extraction and Bone Graph  1. When is this surgery scheduled? 10/31/16    2. Are there any medications that need to be held prior to surgery and how long?Plavix and Xarelto    3. Name of physician performing surgery? Dr.Zafrin  4. What is your office phone and fax number? EL#[phone]  Per Dr. Doloris Hall this is needed for an emergency procedure , Please call if you can before 3:30pm. She will be out of the office after 3:30pm .

## 2016-10-28 NOTE — Telephone Encounter (Signed)
Spoke w dentist earlier who noted concern that patient would need to have the bone graft and tooth extraction done emergently/urgently, requested hold instructions for patient's antiplatelet and anticoagulation meds.  Discussed w Dr. Claiborne Billings - pt 2 years out from last stent (10/30/2014). He advised OK if emergent procedure to hold plavix starting today for 3 day hold, and OK to hold xarelto 48 hrs prior to procedure-- though noted ideally 5 day minimum hold of plavix to reduce bleed risk.  I have left msg w recommendations on dentist office voice mail, w request to call back to verify instruction. I discussed in detail w patient and he is aware of recommendations.  He is aware I have also contacted Dr. Suzette Battiest office to communicate advice.

## 2016-10-29 ENCOUNTER — Encounter (HOSPITAL_COMMUNITY): Payer: Self-pay

## 2016-10-31 ENCOUNTER — Encounter (HOSPITAL_COMMUNITY): Payer: Self-pay

## 2016-10-31 NOTE — Telephone Encounter (Signed)
Forward /deferred to Dr Quay Burow for clearance

## 2016-10-31 NOTE — Telephone Encounter (Signed)
ROUTED INFORMATION TO DR Doloris Hall

## 2016-10-31 NOTE — Telephone Encounter (Signed)
OK to hold plavix ( 7 days prior) and Xarelto ( 3 days prior) for dental procedure  JJB

## 2016-10-31 NOTE — Telephone Encounter (Signed)
F/U Call:  Dr. Fayette Pho is calling and like our office to fax over written documentation of the recommendation to suspend plavix. Patient's appt is scheduled with Dr. Doloris Hall on 11-17-16. Please fax to [phone]. Thanks.

## 2016-11-03 ENCOUNTER — Encounter (HOSPITAL_COMMUNITY): Payer: Self-pay

## 2016-11-05 ENCOUNTER — Encounter (HOSPITAL_COMMUNITY): Payer: Self-pay

## 2016-11-07 ENCOUNTER — Encounter (HOSPITAL_COMMUNITY): Payer: Self-pay

## 2016-11-10 ENCOUNTER — Encounter (HOSPITAL_COMMUNITY): Payer: Self-pay

## 2016-11-12 ENCOUNTER — Encounter (HOSPITAL_COMMUNITY): Payer: Self-pay

## 2016-11-13 ENCOUNTER — Telehealth: Payer: Self-pay | Admitting: Cardiovascular Disease

## 2016-11-13 NOTE — Telephone Encounter (Signed)
Called spoke to Dr Andres Shad

## 2016-11-13 NOTE — Telephone Encounter (Signed)
Follow up    Dr. Doloris Hall is calling back. She ask that she be called back on her direct number. [phone]

## 2016-11-13 NOTE — Telephone Encounter (Signed)
New message       Pt request to talk to the nurse.  He is having a tooth extraction on Monday.  He has already been advised by [place] regarding stopping medications but he has a question.  Please call

## 2016-11-13 NOTE — Telephone Encounter (Signed)
SPOKE TO PATIENT  PATIENT WANT CLARIFICATION OF HOW LONG TO HOLD  PLAVIX AND XARELTO FOR DENTAL PROCEDURE- TOOTH EXTRACTION--- ON Monday 11/17/16.  PATIENT STATES DR Doloris Hall HAS QUESTION CONCERNING PROCEDURE  HAS NOT BEEN ABLE TO CONTACT OFFICE.  INFORMATION WAS FAXED  RE FAXED AND CONTACT Dr Doloris Hall.

## 2016-11-13 NOTE — Telephone Encounter (Signed)
LATE ENTRY  SPOKE TO DR Doloris Hall    STATES RECEIVE INFORMATION FOR CLEARANCE   PATIENT WANTED  DR Doloris Hall TO CONTACT OFFICE   IF PATIENT HAS EXCESSIVE BLEEDING - DR Salley - SHE WOULD USE GUAZE, EPINEPHRINE.   REVIEWED WITH DR RANDOPLH- PER DR [place] NO CHANGE WITH BLEEDING PROTOCOL  RN NOTIFIED DR ZAFRIN,AND PATIENT. BOTH VERBALIZED UNDERSTANDING

## 2016-11-14 ENCOUNTER — Encounter (HOSPITAL_COMMUNITY): Payer: Self-pay

## 2016-11-24 DIAGNOSIS — Z6821 Body mass index (BMI) 21.0-21.9, adult: Secondary | ICD-10-CM | POA: Diagnosis not present

## 2016-11-24 DIAGNOSIS — H1130 Conjunctival hemorrhage, unspecified eye: Secondary | ICD-10-CM | POA: Diagnosis not present

## 2016-11-24 DIAGNOSIS — J069 Acute upper respiratory infection, unspecified: Secondary | ICD-10-CM | POA: Diagnosis not present

## 2016-12-23 ENCOUNTER — Ambulatory Visit (INDEPENDENT_AMBULATORY_CARE_PROVIDER_SITE_OTHER): Payer: Medicare Other | Admitting: Cardiovascular Disease

## 2016-12-23 ENCOUNTER — Encounter: Payer: Self-pay | Admitting: Cardiovascular Disease

## 2016-12-23 VITALS — BP 112/52 | HR 58 | Ht 69.0 in | Wt 147.6 lb

## 2016-12-23 DIAGNOSIS — E7800 Pure hypercholesterolemia, unspecified: Secondary | ICD-10-CM | POA: Diagnosis not present

## 2016-12-23 DIAGNOSIS — I2109 ST elevation (STEMI) myocardial infarction involving other coronary artery of anterior wall: Secondary | ICD-10-CM

## 2016-12-23 DIAGNOSIS — I255 Ischemic cardiomyopathy: Secondary | ICD-10-CM | POA: Diagnosis not present

## 2016-12-23 DIAGNOSIS — I480 Paroxysmal atrial fibrillation: Secondary | ICD-10-CM

## 2016-12-23 NOTE — Progress Notes (Signed)
12/23/2016 Pedro Pope.   Nov 12, 1940  [id number]  Primary Physician Shon Baton, MD Primary Cardiologist: Lorretta Harp MD [name]  HPI:  Pedro Pope is a delightful 76 year old thin appearing very Caucasian male father of 77, grandfather 7 grandchildren and great grandfather of 50 great grandchildren . He is a patient of [name]'s. I last saw him in the office 12/18/15. 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 He basically had no risk factors prior to his presentation. He had an acute anterolateral infarction. By direct intervention by myself 10/30/14 with a drug-eluting stent in his proximal LAD. He had a left dominant system and had moderate proximal AV groove circumflex disease with high-grade nondominant RCA disease. Ejection fraction was in the 40-45% range with moderate anteroapical hypokinesia. He was discharged home 2 days after admission and was admitted several days later with A. Fib with RVR which she was symptomatic. His EKG showed changes and he has converted to sinus rhythm. Consideration was given to oral anticoagulation over the risks of bleeding were thought to outweigh the benefits. He is wearing an event monitor and has noticed several episodes of brief PAF. His beta blocker was discontinued because of bradycardia and hypotension. Recent Myoview showed dense scar in the LAD territory and 2-D echo revealed an EF of 30-35%. He participated in cardiac rehabilitation and is relatively asymptomatic. His most recent 2-D echo performed 04/19/15 showed improvement in his LV function up to the 40-45% range. I did send him to see Dr. Rayann Heman for consideration of ICD therapy and to discuss his A. Fib. He was ultimately placed on Xarelto oral anti-coagulation and his Luna Kitchens was transitioned to Plavix to mitigate his bleeding risk. Since I saw him in the office 12/18/15 he has had occasional nosebleeds. He denies chest pain or shortness of  breath.  Current Outpatient Prescriptions  Medication Sig Dispense Refill  . Ascorbic Acid (VITAMIN C) 500 MG tablet Take 500 mg by mouth daily.      Marland Kitchen atorvastatin (LIPITOR) 40 MG tablet TAKE ONE TABLET BY MOUTH ONCE DAILY AT 6PM 30 tablet 11  . Blood Pressure Monitor KIT For daily blood pressure monitoring;  Arm Cuff 1 each 0  . carvedilol (COREG) 3.125 MG tablet Take 1 tablet (3.125 mg total) by mouth 2 (two) times daily with a meal. 180 tablet 1  . clopidogrel (PLAVIX) 75 MG tablet TAKE ONE TABLET BY MOUTH ONCE DAILY 30 tablet 11  . colchicine 0.6 MG tablet Take 1 tablet (0.6 mg total) by mouth daily. (Patient taking differently: Take 0.6 mg by mouth as needed. AS NEEDED FOR GOUT.) 30 tablet 3  . NITROSTAT 0.4 MG SL tablet DISSOLVE ONE TABLET UNDER THE TONGUE EVERY 5 MINUTES AS NEEDED FOR CHEST PAIN. 25 tablet 4  . rivaroxaban (XARELTO) 20 MG TABS tablet Take 1 tablet (20 mg total) by mouth daily with supper. 30 tablet 11   No current facility-administered medications for this visit.     Allergies  Allergen Reactions  . Penicillins Anaphylaxis  . Corn Syrup [Glucose] Other (See Comments)    High fructose corn syrup causes gout  . Aspirin Adult Low [Aspirin] Itching and Rash    Dr. [name] suspects that long term caused a rash, short term use appears to be ok    Social History   Social History  . Marital status: Married    Spouse name: N/A  . Number of children:  N/A  . Years of education: N/A   Occupational History  . Not on file.   Social History Main Topics  . Smoking status: Former Smoker    Quit date: 08/18/1992  . Smokeless tobacco: Never Used  . Alcohol use Yes     Comment: social  . Drug use: No  . Sexual activity: Not on file   Other Topics Concern  . Not on file   Social History Narrative   Pt lives in [place] with spouse.  Realtor.  Retired Environmental education officer     Review of Systems: General: negative for chills, fever, night sweats or weight changes.    Cardiovascular: negative for chest pain, dyspnea on exertion, edema, orthopnea, palpitations, paroxysmal nocturnal dyspnea or shortness of breath Dermatological: negative for rash Respiratory: negative for cough or wheezing Urologic: negative for hematuria Abdominal: negative for nausea, vomiting, diarrhea, bright red blood per rectum, melena, or hematemesis Neurologic: negative for visual changes, syncope, or dizziness All other systems reviewed and are otherwise negative except as noted above.    Blood pressure (!) 112/52, pulse (!) 58, height _0  (1.753 m), weight 147 lb 9.6 oz (67 kg).  General appearance: alert and no distress Neck: no adenopathy, no carotid bruit, no JVD, supple, symmetrical, trachea midline and thyroid not enlarged, symmetric, no tenderness/mass/nodules Lungs: clear to auscultation bilaterally Heart: regular rate and rhythm, S1, S2 normal, no murmur, click, rub or gallop Extremities: extremities normal, atraumatic, no cyanosis or edema  EKG sinus bradycardia at 58 with anterolateral Q waves consistent with an anterolateral myocardial infarction occasional PVC. I personally reviewed this EKG  ASSESSMENT AND PLAN:   ST elevation myocardial infarction (STEMI) involving other coronary artery of anterior wall - STEMI s/p DES to proximal LAD; residual 80 % proximal and 60% mid LCx diease Pedro Pope had an anterior STEMI 10/30/14 with direct intervention by myself using a drug loading stent in his proximal LAD. A left dominant system with moderate proximal AV groove circumflex disease as well as high-grade nondominant RCA disease. His EF was in the 40-45% by most recent 2-D echo 04/19/15. He denies chest pain or shortness of breath.  Hyperlipidemia History of hyperlipidemia on statin therapy with recent lipid profile performed 09/18/16 revealing total cholesterol 89, LDL 43 and HDL of 32.  Ischemic cardiomyopathy History of ischemic cardiomyopathy with an EF of 40% range  on appropriate medications, a symptomatic.  PAF post MI History of PAF maintaining sinus rhythm on Xarelto (CHADs VASc = 3)      Lorretta Harp MD [facility], [facility] 12/23/2016 1:56 PM

## 2016-12-23 NOTE — Assessment & Plan Note (Signed)
History of PAF maintaining sinus rhythm on Xarelto (CHADs VASc = 3)

## 2016-12-23 NOTE — Assessment & Plan Note (Signed)
Pedro Pope had an anterior STEMI 10/30/14 with direct intervention by myself using a drug loading stent in his proximal LAD. A left dominant system with moderate proximal AV groove circumflex disease as well as high-grade nondominant RCA disease. His EF was in the 40-45% by most recent 2-D echo 04/19/15. He denies chest pain or shortness of breath.

## 2016-12-23 NOTE — Assessment & Plan Note (Signed)
History of ischemic cardiomyopathy with an EF of 40% range on appropriate medications, a symptomatic.

## 2016-12-23 NOTE — Assessment & Plan Note (Signed)
History of hyperlipidemia on statin therapy with recent lipid profile performed 09/18/16 revealing total cholesterol 89, LDL 43 and HDL of 32.

## 2016-12-23 NOTE — Patient Instructions (Signed)
Dr Berry recommends that you schedule a follow-up appointment in 12 months. You will receive a reminder letter in the mail two months in advance. If you don't receive a letter, please call our office to schedule the follow-up appointment.  If you need a refill on your cardiac medications before your next appointment, please call your pharmacy. 

## 2017-01-29 ENCOUNTER — Other Ambulatory Visit: Payer: Self-pay | Admitting: Cardiovascular Disease

## 2017-01-30 NOTE — Telephone Encounter (Signed)
Rx(s) sent to pharmacy electronically.  

## 2017-02-05 DIAGNOSIS — S61218A Laceration without foreign body of other finger without damage to nail, initial encounter: Secondary | ICD-10-CM | POA: Diagnosis not present

## 2017-02-11 ENCOUNTER — Other Ambulatory Visit: Payer: Self-pay | Admitting: Cardiovascular Disease

## 2017-02-13 DIAGNOSIS — Z4802 Encounter for removal of sutures: Secondary | ICD-10-CM | POA: Diagnosis not present

## 2017-02-13 DIAGNOSIS — S61219S Laceration without foreign body of unspecified finger without damage to nail, sequela: Secondary | ICD-10-CM | POA: Diagnosis not present

## 2017-02-13 DIAGNOSIS — Z6821 Body mass index (BMI) 21.0-21.9, adult: Secondary | ICD-10-CM | POA: Diagnosis not present

## 2017-02-23 ENCOUNTER — Telehealth: Payer: Self-pay | Admitting: Cardiovascular Disease

## 2017-02-23 NOTE — Telephone Encounter (Signed)
Requesting surgical clearance:  1. Type of surgery: Colonoscopy  2. Surgeon: Dr. Paulita Fujita  3.Surgical Date:  03/17/17  4. Medications that need to be held: Xarelto   5. CAD: Yes  6. I will defer to:  Dr. Orpha Bur long to hold Xarelto if cleared?   Contact Information:  [facility] Phone:  [phone] Fax:  [phone]

## 2017-02-24 DIAGNOSIS — H2513 Age-related nuclear cataract, bilateral: Secondary | ICD-10-CM | POA: Diagnosis not present

## 2017-02-24 NOTE — Telephone Encounter (Signed)
okay to hold Xarelto for colonoscopy

## 2017-02-28 ENCOUNTER — Other Ambulatory Visit: Payer: Self-pay | Admitting: Cardiovascular Disease

## 2017-03-02 NOTE — Telephone Encounter (Signed)
Needs recent blood work

## 2017-03-03 NOTE — Telephone Encounter (Signed)
Labs requested from [facility]

## 2017-03-10 ENCOUNTER — Telehealth: Payer: Self-pay | Admitting: Cardiovascular Disease

## 2017-03-10 NOTE — Telephone Encounter (Signed)
3 days

## 2017-03-10 NOTE — Telephone Encounter (Signed)
Hold xarelto x 2 days prior to procedure

## 2017-03-10 NOTE — Telephone Encounter (Signed)
New message     They received the surgical clearance how long should patient hold the Xarelto , it was not specified on the paperwork   Fax [phone]  Procedure is being done on 03/17/17

## 2017-03-10 NOTE — Telephone Encounter (Signed)
Faxed via EPIC to [facility]

## 2017-03-10 NOTE — Telephone Encounter (Signed)
Clearance faxed to number provided via EPIC. 

## 2017-03-12 DIAGNOSIS — L821 Other seborrheic keratosis: Secondary | ICD-10-CM | POA: Diagnosis not present

## 2017-03-12 DIAGNOSIS — D1801 Hemangioma of skin and subcutaneous tissue: Secondary | ICD-10-CM | POA: Diagnosis not present

## 2017-03-12 DIAGNOSIS — L814 Other melanin hyperpigmentation: Secondary | ICD-10-CM | POA: Diagnosis not present

## 2017-03-12 DIAGNOSIS — L218 Other seborrheic dermatitis: Secondary | ICD-10-CM | POA: Diagnosis not present

## 2017-03-16 ENCOUNTER — Other Ambulatory Visit: Payer: Self-pay

## 2017-03-16 ENCOUNTER — Other Ambulatory Visit: Payer: Self-pay | Admitting: Pharmacist Clinician (PhC)/ Clinical Pharmacy Specialist

## 2017-03-16 MED ORDER — RIVAROXABAN 20 MG PO TABS: ORAL_TABLET | ORAL | 1 refills | Status: DC

## 2017-03-16 MED ORDER — CARVEDILOL 3.125 MG PO TABS: 3.1250 mg | ORAL_TABLET | Freq: Two times a day (BID) | ORAL | 3 refills | Status: DC

## 2017-03-16 MED ORDER — CLOPIDOGREL BISULFATE 75 MG PO TABS
75.0000 mg | ORAL_TABLET | Freq: Every day | ORAL | 6 refills | Status: DC
Start: 2017-03-16 — End: 2017-08-18

## 2017-03-16 MED ORDER — ATORVASTATIN CALCIUM 40 MG PO TABS: 40.0000 mg | ORAL_TABLET | Freq: Every day | ORAL | 8 refills | Status: DC

## 2017-03-16 MED ORDER — CARVEDILOL 3.125 MG PO TABS: 3.1250 mg | ORAL_TABLET | Freq: Two times a day (BID) | ORAL | 1 refills | Status: DC

## 2017-03-16 MED ORDER — ATORVASTATIN CALCIUM 40 MG PO TABS: 40.0000 mg | ORAL_TABLET | Freq: Every day | ORAL | 3 refills | Status: DC

## 2017-03-17 DIAGNOSIS — K5730 Diverticulosis of large intestine without perforation or abscess without bleeding: Secondary | ICD-10-CM | POA: Diagnosis not present

## 2017-03-17 DIAGNOSIS — Z1211 Encounter for screening for malignant neoplasm of colon: Secondary | ICD-10-CM | POA: Diagnosis not present

## 2017-03-17 DIAGNOSIS — Z800 Family history of malignant neoplasm of digestive organs: Secondary | ICD-10-CM | POA: Diagnosis not present

## 2017-03-25 DIAGNOSIS — H25812 Combined forms of age-related cataract, left eye: Secondary | ICD-10-CM | POA: Diagnosis not present

## 2017-03-25 DIAGNOSIS — H2512 Age-related nuclear cataract, left eye: Secondary | ICD-10-CM | POA: Diagnosis not present

## 2017-03-25 DIAGNOSIS — H5212 Myopia, left eye: Secondary | ICD-10-CM | POA: Diagnosis not present

## 2017-04-23 DIAGNOSIS — Z6821 Body mass index (BMI) 21.0-21.9, adult: Secondary | ICD-10-CM | POA: Diagnosis not present

## 2017-04-23 DIAGNOSIS — I509 Heart failure, unspecified: Secondary | ICD-10-CM | POA: Diagnosis not present

## 2017-04-23 DIAGNOSIS — I2510 Atherosclerotic heart disease of native coronary artery without angina pectoris: Secondary | ICD-10-CM | POA: Diagnosis not present

## 2017-04-23 DIAGNOSIS — Z23 Encounter for immunization: Secondary | ICD-10-CM | POA: Diagnosis not present

## 2017-04-23 DIAGNOSIS — E781 Pure hyperglyceridemia: Secondary | ICD-10-CM | POA: Diagnosis not present

## 2017-04-23 DIAGNOSIS — H269 Unspecified cataract: Secondary | ICD-10-CM | POA: Diagnosis not present

## 2017-04-23 DIAGNOSIS — I4891 Unspecified atrial fibrillation: Secondary | ICD-10-CM | POA: Diagnosis not present

## 2017-04-23 DIAGNOSIS — R946 Abnormal results of thyroid function studies: Secondary | ICD-10-CM | POA: Diagnosis not present

## 2017-04-23 DIAGNOSIS — I255 Ischemic cardiomyopathy: Secondary | ICD-10-CM | POA: Diagnosis not present

## 2017-06-10 DIAGNOSIS — H2511 Age-related nuclear cataract, right eye: Secondary | ICD-10-CM | POA: Diagnosis not present

## 2017-06-10 DIAGNOSIS — H25812 Combined forms of age-related cataract, left eye: Secondary | ICD-10-CM | POA: Diagnosis not present

## 2017-08-18 ENCOUNTER — Other Ambulatory Visit: Payer: Self-pay | Admitting: Cardiovascular Disease

## 2017-08-19 NOTE — Telephone Encounter (Signed)
Rx has been sent to the pharmacy electronically. ° °

## 2017-09-24 DIAGNOSIS — R946 Abnormal results of thyroid function studies: Secondary | ICD-10-CM | POA: Diagnosis not present

## 2017-09-24 DIAGNOSIS — R7309 Other abnormal glucose: Secondary | ICD-10-CM | POA: Diagnosis not present

## 2017-09-24 DIAGNOSIS — Z125 Encounter for screening for malignant neoplasm of prostate: Secondary | ICD-10-CM | POA: Diagnosis not present

## 2017-09-24 DIAGNOSIS — M109 Gout, unspecified: Secondary | ICD-10-CM | POA: Diagnosis not present

## 2017-09-24 DIAGNOSIS — R82998 Other abnormal findings in urine: Secondary | ICD-10-CM | POA: Diagnosis not present

## 2017-09-24 DIAGNOSIS — E781 Pure hyperglyceridemia: Secondary | ICD-10-CM | POA: Diagnosis not present

## 2017-09-27 ENCOUNTER — Other Ambulatory Visit: Payer: Self-pay | Admitting: Cardiovascular Disease

## 2017-09-28 NOTE — Telephone Encounter (Signed)
REFILL 

## 2017-10-01 DIAGNOSIS — I252 Old myocardial infarction: Secondary | ICD-10-CM | POA: Diagnosis not present

## 2017-10-01 DIAGNOSIS — Z1389 Encounter for screening for other disorder: Secondary | ICD-10-CM | POA: Diagnosis not present

## 2017-10-01 DIAGNOSIS — I255 Ischemic cardiomyopathy: Secondary | ICD-10-CM | POA: Diagnosis not present

## 2017-10-01 DIAGNOSIS — Z6821 Body mass index (BMI) 21.0-21.9, adult: Secondary | ICD-10-CM | POA: Diagnosis not present

## 2017-10-01 DIAGNOSIS — Z9861 Coronary angioplasty status: Secondary | ICD-10-CM | POA: Diagnosis not present

## 2017-10-01 DIAGNOSIS — I2721 Secondary pulmonary arterial hypertension: Secondary | ICD-10-CM | POA: Diagnosis not present

## 2017-10-01 DIAGNOSIS — I509 Heart failure, unspecified: Secondary | ICD-10-CM | POA: Diagnosis not present

## 2017-10-01 DIAGNOSIS — R008 Other abnormalities of heart beat: Secondary | ICD-10-CM | POA: Diagnosis not present

## 2017-10-01 DIAGNOSIS — I2510 Atherosclerotic heart disease of native coronary artery without angina pectoris: Secondary | ICD-10-CM | POA: Diagnosis not present

## 2017-10-01 DIAGNOSIS — I4891 Unspecified atrial fibrillation: Secondary | ICD-10-CM | POA: Diagnosis not present

## 2017-10-01 DIAGNOSIS — Z0000 Encounter for general adult medical examination without abnormal findings: Secondary | ICD-10-CM | POA: Diagnosis not present

## 2017-10-01 DIAGNOSIS — R946 Abnormal results of thyroid function studies: Secondary | ICD-10-CM | POA: Diagnosis not present

## 2017-10-01 DIAGNOSIS — Z23 Encounter for immunization: Secondary | ICD-10-CM | POA: Diagnosis not present

## 2017-10-08 DIAGNOSIS — Z1212 Encounter for screening for malignant neoplasm of rectum: Secondary | ICD-10-CM | POA: Diagnosis not present

## 2017-10-20 DIAGNOSIS — N5231 Erectile dysfunction following radical prostatectomy: Secondary | ICD-10-CM | POA: Diagnosis not present

## 2017-10-20 DIAGNOSIS — C61 Malignant neoplasm of prostate: Secondary | ICD-10-CM | POA: Diagnosis not present

## 2017-10-28 ENCOUNTER — Other Ambulatory Visit: Payer: Self-pay | Admitting: Cardiovascular Disease

## 2017-10-29 NOTE — Telephone Encounter (Signed)
Recent blood work @ [facility]

## 2017-12-14 ENCOUNTER — Telehealth: Payer: Self-pay | Admitting: Cardiovascular Disease

## 2017-12-14 NOTE — Telephone Encounter (Signed)
   [facility] Pre-operative Risk Assessment    Request for surgical clearance:  1. What type of surgery is being performed? 2 Dental Implants   2. When is this surgery scheduled? 12/23/2017   3. What type of clearance is required (medical clearance vs. Pharmacy clearance to hold med vs. Both)? PHARMACY  4. Are there any medications that need to be held prior to surgery and how long? Plavix and Xarelto (pt had extraction last year and even with holding Plavix for 7 days and Xarelto for 3 days he experienced excessive bleeding and a liver clot after the procedure)  5. Practice name and name of physician performing surgery? [facility]; Dr. Johnnette Gourd    6. What is your office phone number? [phone]    7.   What is your office fax number. [phone]  8.   Anesthesia type (None, local, MAC, general) ?    Newt Minion 12/14/2017, 4:27 PM  _________________________________________________________________   (provider comments below)

## 2017-12-14 NOTE — Telephone Encounter (Signed)
Routing to both pharmacy and Dr. Gwenlyn Found for input.   Dr. Gwenlyn Found - please route your recommendations back to the CV DIV Preop pool   Burtis Junes, RN, [facility] [address] [place], [place]  [phone]

## 2017-12-14 NOTE — Telephone Encounter (Signed)
New Message:      Dr. Emmit Pomfret was told to call and speak with someone regarding this pt's clearance. She states she was told she needed to answer two questions. She states this is regarding pt getting dental implants.

## 2017-12-14 NOTE — Telephone Encounter (Signed)
Pt takes Xarelto for afib with CHADS2VASc score of 4 (age x2, CAD, CHF). Renal function is normal. Recommend holding Xarelto for 2 days prior to procedure. Ok to hold up to 3 days if needed given prior bleeding after extractions.

## 2017-12-15 NOTE — Telephone Encounter (Signed)
   Awaiting input regarding +/- holding Plavix for dental implant. Patient with hx of anterior MI in 2016 tx with DES to LAD, parox AF.  He remains on Clopidogrel and Rivaroxaban.  Richardson Dopp, PA-C    12/15/2017 3:42 PM

## 2017-12-16 NOTE — Telephone Encounter (Signed)
Okay to control his Plavix if necessary

## 2017-12-17 NOTE — Telephone Encounter (Signed)
   Primary Cardiologist: No primary care provider on file.  Chart reviewed as part of pre-operative protocol coverage. Given past medical history and time since last visit, based on ACC/AHA guidelines, Pedro Pope. would be at acceptable risk for the planned procedure without further cardiovascular testing.   Patient's chart was reviewed by our clinical pharmacist who has recommended the patient hold Xarelto for 2 days prior to procedure, though may hold up to 3 days if needed given prior history of bleeding following extractions.  The patient's chart was also reviewed by his primary cardiologist, Dr. Gwenlyn Found, who has provided clearance to hold the patient's Plavix if necessary.  Plavix is typically held for 5-7 days prior to procedure.  Resumption of these medications should be undertaken as soon as felt to be safely possible and is deferred to the treating physician.  I will route this recommendation to the requesting party via Epic fax function and remove from pre-op pool.  Please call with questions.  Christell Faith, PA-C 12/17/2017, 3:39 PM

## 2017-12-18 NOTE — Telephone Encounter (Signed)
Info was sent but they have not rec'd will fax.

## 2017-12-18 NOTE — Telephone Encounter (Signed)
I called [phone] to verify fax number. I s/w Dr. Doloris Hall herself which she did verify fax # [phone]. I will manually fax clearance.

## 2017-12-18 NOTE — Telephone Encounter (Signed)
Follow Up    [facility] dental office calling, they have not received pre op clearance letter. Please fax  [phone] Any questions call [phone]

## 2017-12-29 ENCOUNTER — Ambulatory Visit (INDEPENDENT_AMBULATORY_CARE_PROVIDER_SITE_OTHER): Payer: Medicare Other | Admitting: Cardiovascular Disease

## 2017-12-29 ENCOUNTER — Encounter: Payer: Self-pay | Admitting: Cardiovascular Disease

## 2017-12-29 DIAGNOSIS — I255 Ischemic cardiomyopathy: Secondary | ICD-10-CM

## 2017-12-29 DIAGNOSIS — E7800 Pure hypercholesterolemia, unspecified: Secondary | ICD-10-CM | POA: Diagnosis not present

## 2017-12-29 DIAGNOSIS — I2109 ST elevation (STEMI) myocardial infarction involving other coronary artery of anterior wall: Secondary | ICD-10-CM | POA: Diagnosis not present

## 2017-12-29 DIAGNOSIS — I480 Paroxysmal atrial fibrillation: Secondary | ICD-10-CM | POA: Diagnosis not present

## 2017-12-29 NOTE — Assessment & Plan Note (Addendum)
History of ischemic cardiomyopathy with an EF in the 40 to 45% range asymptomatic on appropriate medications.

## 2017-12-29 NOTE — Assessment & Plan Note (Signed)
History of PAF maintaining sinus rhythm on Xarelto.

## 2017-12-29 NOTE — Progress Notes (Signed)
12/29/2017 Pedro Pope.   November 01, 1940  [id number]  Primary Physician Shon Baton, MD Primary Cardiologist: Lorretta Harp MD Pedro Pope, [place]  HPI:  Pedro Pope. is a 77 y.o.  thin appearing very Caucasian male father of 26, grandfather 7 grandchildren and great grandfather of 37 great grandchildren . He is a patient of Dr. Jenny Reichmann Pope's. I last saw him in the office 12/23/2016. 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 He basically had no risk factors prior to his presentation. He had an acute anterolateral infarction. By direct intervention by myself 10/30/14 with a drug-eluting stent in his proximal LAD. He had a left dominant system and had moderate proximal AV groove circumflex disease with high-grade nondominant RCA disease. Ejection fraction was in the 40-45% range with moderate anteroapical hypokinesia. He was discharged home 2 days after admission and was admitted several days later with A. Fib with RVR which she was symptomatic. His EKG showed changes and he has converted to sinus rhythm. Consideration was given to oral anticoagulation over the risks of bleeding were thought to outweigh the benefits. He is wearing an event monitor and has noticed several episodes of brief PAF. His beta blocker was discontinued because of bradycardia and hypotension. Recent Myoview showed dense scar in the LAD territory and 2-D echo revealed an EF of 30-35%. He participated in cardiac rehabilitation and is relatively asymptomatic. His most recent 2-D echo performed 04/19/15 showed improvement in his LV function up to the 40-45% range. I did send him to see Dr. Rayann Pope for consideration of ICD therapy and to discuss his A. Fib. He was ultimately placed on Xarelto oral anti-coagulation and his Pedro Pope was transitioned to Plavix to mitigate his bleeding risk. Since I saw him in the office 12/18/15 he has had occasional nosebleeds. He denies chest pain or shortness of  breath.  Recent lipid profile performed by his PCP 09/24/2017 revealed total cholesterol 84, LDL 37 and HDL of 36.     Current Meds  Medication Sig  . Ascorbic Acid (VITAMIN C) 500 MG tablet Take 500 mg by mouth daily.    Marland Kitchen atorvastatin (LIPITOR) 40 MG tablet Take 1 tablet (40 mg total) by mouth daily at 6 PM.  . Blood Pressure Monitor KIT For daily blood pressure monitoring;  Arm Cuff  . carvedilol (COREG) 3.125 MG tablet Take 1 tablet (3.125 mg total) by mouth 2 (two) times daily with a meal.  . clopidogrel (PLAVIX) 75 MG tablet TAKE 1 TABLET BY MOUTH EVERY DAY  . colchicine 0.6 MG tablet Take 1 tablet (0.6 mg total) by mouth daily. (Patient taking differently: Take 0.6 mg by mouth as needed. AS NEEDED FOR GOUT.)  . NITROSTAT 0.4 MG SL tablet DISSOLVE ONE TABLET UNDER THE TONGUE EVERY 5 MINUTES AS NEEDED FOR CHEST PAIN.  Marland Kitchen XARELTO 20 MG TABS tablet TAKE 1 TABLET BY MOUTH ONCE DAILY WITH SUPPER.     Allergies  Allergen Reactions  . Penicillins Anaphylaxis  . Corn Syrup [Glucose] Other (See Comments)    High fructose corn syrup causes gout  . Aspirin Adult Low [Aspirin] Itching and Rash    Dr. [name] suspects that long term caused a rash, short term use appears to be ok    Social History   Socioeconomic History  . Marital status: Married    Spouse name: Not on file  . Number of children: Not on file  . Years of education: Not  on file  . Highest education level: Not on file  Occupational History  . Not on file  Social Needs  . Financial resource strain: Not on file  . Food insecurity:    Worry: Not on file    Inability: Not on file  . Transportation needs:    Medical: Not on file    Non-medical: Not on file  Tobacco Use  . Smoking status: Former Smoker    Last attempt to quit: 08/18/1992    Years since quitting: 25.3  . Smokeless tobacco: Never Used  Substance and Sexual Activity  . Alcohol use: Yes    Comment: social  . Drug use: No  . Sexual activity: Not on file    Lifestyle  . Physical activity:    Days per week: Not on file    Minutes per session: Not on file  . Stress: Not on file  Relationships  . Social connections:    Talks on phone: Not on file    Gets together: Not on file    Attends religious service: Not on file    Active member of club or organization: Not on file    Attends meetings of clubs or organizations: Not on file    Relationship status: Not on file  . Intimate partner violence:    Fear of current or ex partner: Not on file    Emotionally abused: Not on file    Physically abused: Not on file    Forced sexual activity: Not on file  Other Topics Concern  . Not on file  Social History Narrative   Pt lives in [place] with spouse.  Realtor.  Retired Environmental education officer     Review of Systems: General: negative for chills, fever, night sweats or weight changes.  Cardiovascular: negative for chest pain, dyspnea on exertion, edema, orthopnea, palpitations, paroxysmal nocturnal dyspnea or shortness of breath Dermatological: negative for rash Respiratory: negative for cough or wheezing Urologic: negative for hematuria Abdominal: negative for nausea, vomiting, diarrhea, bright red blood per rectum, melena, or hematemesis Neurologic: negative for visual changes, syncope, or dizziness All other systems reviewed and are otherwise negative except as noted above.    Blood pressure (!) 116/54, pulse (!) 53, height 5' 9" (1.753 m), weight 145 lb (65.8 kg).  General appearance: alert and no distress Neck: no adenopathy, no carotid bruit, no JVD, supple, symmetrical, trachea midline and thyroid not enlarged, symmetric, no tenderness/mass/nodules Lungs: clear to auscultation bilaterally Heart: regular rate and rhythm, S1, S2 normal, no murmur, click, rub or gallop Extremities: extremities normal, atraumatic, no cyanosis or edema Pulses: 2+ and symmetric Skin: Skin color, texture, turgor normal. No rashes or lesions Neurologic: Alert and  oriented X 3, normal strength and tone. Normal symmetric reflexes. Normal coordination and gait  EKG sinus bradycardia 53 anterolateral T wave inversion is R wave progression along with small inferior Q waves.  I personally reviewed this EKG.  ASSESSMENT AND PLAN:   ST elevation myocardial infarction (STEMI) involving other coronary artery of anterior wall - STEMI s/p DES to proximal LAD; residual 80 % proximal and 60% mid LCx diease History of CAD status post anterolateral STEMI treated with direct intervention by myself 10/30/2014 with a drug-eluting stent to his proximal LAD.  He had a left dominant system with moderate and high-grade nondominant RCA disease with an EF initially 40 to 45%.  He currently denies chest pain or shortness of breath.  He remains on clopidogrel.  Hyperlipidemia She hyperlipidemia on statin therapy with  recent lipid profile performed 09/24/2017 revealing total cholesterol 84, LDL 37 HDL of 36.  Ischemic cardiomyopathy History of ischemic cardiomyopathy with an EF in the 40 to 45% range asymptomatic on appropriate medications.  PAF post MI History of PAF maintaining sinus rhythm on Xarelto.      Lorretta Harp MD FACP,FACC,FAHA, [facility] 12/29/2017 11:26 AM

## 2017-12-29 NOTE — Patient Instructions (Signed)

## 2017-12-29 NOTE — Assessment & Plan Note (Signed)
She hyperlipidemia on statin therapy with recent lipid profile performed 09/24/2017 revealing total cholesterol 84, LDL 37 HDL of 36.

## 2017-12-29 NOTE — Assessment & Plan Note (Signed)
History of CAD status post anterolateral STEMI treated with direct intervention by myself 10/30/2014 with a drug-eluting stent to his proximal LAD.  He had a left dominant system with moderate and high-grade nondominant RCA disease with an EF initially 40 to 45%.  He currently denies chest pain or shortness of breath.  He remains on clopidogrel.

## 2018-01-12 DIAGNOSIS — Z961 Presence of intraocular lens: Secondary | ICD-10-CM | POA: Diagnosis not present

## 2018-01-12 DIAGNOSIS — H353131 Nonexudative age-related macular degeneration, bilateral, early dry stage: Secondary | ICD-10-CM | POA: Diagnosis not present

## 2018-03-15 DIAGNOSIS — L821 Other seborrheic keratosis: Secondary | ICD-10-CM | POA: Diagnosis not present

## 2018-03-15 DIAGNOSIS — D225 Melanocytic nevi of trunk: Secondary | ICD-10-CM | POA: Diagnosis not present

## 2018-03-15 DIAGNOSIS — L814 Other melanin hyperpigmentation: Secondary | ICD-10-CM | POA: Diagnosis not present

## 2018-03-15 DIAGNOSIS — D1801 Hemangioma of skin and subcutaneous tissue: Secondary | ICD-10-CM | POA: Diagnosis not present

## 2018-03-19 DIAGNOSIS — Z6821 Body mass index (BMI) 21.0-21.9, adult: Secondary | ICD-10-CM | POA: Diagnosis not present

## 2018-03-19 DIAGNOSIS — M79671 Pain in right foot: Secondary | ICD-10-CM | POA: Diagnosis not present

## 2018-03-30 DIAGNOSIS — M79671 Pain in right foot: Secondary | ICD-10-CM | POA: Diagnosis not present

## 2018-03-31 ENCOUNTER — Other Ambulatory Visit: Payer: Self-pay | Admitting: Cardiovascular Disease

## 2018-03-31 NOTE — Telephone Encounter (Signed)
Rx request sent to pharmacy.  

## 2018-04-08 DIAGNOSIS — Z23 Encounter for immunization: Secondary | ICD-10-CM | POA: Diagnosis not present

## 2018-04-08 DIAGNOSIS — Z9861 Coronary angioplasty status: Secondary | ICD-10-CM | POA: Diagnosis not present

## 2018-04-08 DIAGNOSIS — I4891 Unspecified atrial fibrillation: Secondary | ICD-10-CM | POA: Diagnosis not present

## 2018-04-08 DIAGNOSIS — R7309 Other abnormal glucose: Secondary | ICD-10-CM | POA: Diagnosis not present

## 2018-04-08 DIAGNOSIS — R946 Abnormal results of thyroid function studies: Secondary | ICD-10-CM | POA: Diagnosis not present

## 2018-04-08 DIAGNOSIS — R030 Elevated blood-pressure reading, without diagnosis of hypertension: Secondary | ICD-10-CM | POA: Diagnosis not present

## 2018-04-08 DIAGNOSIS — I255 Ischemic cardiomyopathy: Secondary | ICD-10-CM | POA: Diagnosis not present

## 2018-04-08 DIAGNOSIS — N329 Bladder disorder, unspecified: Secondary | ICD-10-CM | POA: Diagnosis not present

## 2018-04-08 DIAGNOSIS — M109 Gout, unspecified: Secondary | ICD-10-CM | POA: Diagnosis not present

## 2018-04-08 DIAGNOSIS — Z6821 Body mass index (BMI) 21.0-21.9, adult: Secondary | ICD-10-CM | POA: Diagnosis not present

## 2018-04-08 DIAGNOSIS — M1990 Unspecified osteoarthritis, unspecified site: Secondary | ICD-10-CM | POA: Diagnosis not present

## 2018-04-30 DIAGNOSIS — Z8546 Personal history of malignant neoplasm of prostate: Secondary | ICD-10-CM | POA: Diagnosis not present

## 2018-04-30 DIAGNOSIS — N3041 Irradiation cystitis with hematuria: Secondary | ICD-10-CM | POA: Diagnosis not present

## 2018-04-30 DIAGNOSIS — Z87891 Personal history of nicotine dependence: Secondary | ICD-10-CM | POA: Diagnosis not present

## 2018-04-30 DIAGNOSIS — N5231 Erectile dysfunction following radical prostatectomy: Secondary | ICD-10-CM | POA: Diagnosis not present

## 2018-04-30 DIAGNOSIS — N200 Calculus of kidney: Secondary | ICD-10-CM | POA: Diagnosis not present

## 2018-04-30 DIAGNOSIS — R310 Gross hematuria: Secondary | ICD-10-CM | POA: Diagnosis not present

## 2018-04-30 DIAGNOSIS — N3040 Irradiation cystitis without hematuria: Secondary | ICD-10-CM | POA: Diagnosis not present

## 2018-04-30 DIAGNOSIS — C61 Malignant neoplasm of prostate: Secondary | ICD-10-CM | POA: Diagnosis not present

## 2018-05-07 ENCOUNTER — Telehealth: Payer: Self-pay | Admitting: Cardiovascular Disease

## 2018-05-07 NOTE — Telephone Encounter (Signed)
Returned call to patient, patient made aware of recommendations and verbalized understanding.

## 2018-05-07 NOTE — Telephone Encounter (Signed)
Per pt call stated he needs a call back about the blood in his urine. Pt stated he has seen his Urologist prescried  Med Elmiron 100 mg 3x daily.  He has a question about the interaction with other medications.  Pt c/o medication issue:  1. Name of Medication: ELMIRON 100mg    2. How are you currently taking this medication (dosage and times per day)? 3x day  3. Are you having a reaction (difficulty breathing--STAT)? no  4. What is your medication issue? Questions about Interactions with his current medication.

## 2018-05-07 NOTE — Telephone Encounter (Signed)
Have patient hold xarelto for 2 days (CHADS2-VASc score of 2).  Should follow with Urology about hematuria

## 2018-05-25 ENCOUNTER — Other Ambulatory Visit: Payer: Self-pay | Admitting: Cardiovascular Disease

## 2018-07-28 ENCOUNTER — Other Ambulatory Visit: Payer: Self-pay | Admitting: Cardiovascular Disease

## 2018-07-28 NOTE — Telephone Encounter (Signed)
Rx request sent to pharmacy.  

## 2018-07-30 ENCOUNTER — Other Ambulatory Visit: Payer: Self-pay | Admitting: *Deleted

## 2018-07-30 MED ORDER — NITROGLYCERIN 0.4 MG SL SUBL: 0.4000 mg | SUBLINGUAL_TABLET | SUBLINGUAL | 2 refills | Status: DC | PRN

## 2018-07-30 NOTE — Telephone Encounter (Signed)
Rx has been sent to the pharmacy electronically. ° °

## 2018-09-10 ENCOUNTER — Other Ambulatory Visit: Payer: Self-pay | Admitting: *Deleted

## 2018-09-10 ENCOUNTER — Telehealth: Payer: Self-pay | Admitting: Cardiovascular Disease

## 2018-09-10 MED ORDER — CARVEDILOL 3.125 MG PO TABS: 3.1250 mg | ORAL_TABLET | Freq: Two times a day (BID) | ORAL | 0 refills | Status: DC

## 2018-09-10 MED ORDER — CARVEDILOL 3.125 MG PO TABS: 3.1250 mg | ORAL_TABLET | Freq: Two times a day (BID) | ORAL | 1 refills | Status: DC

## 2018-09-10 NOTE — Telephone Encounter (Signed)
New message      *STAT* If patient is at the pharmacy, call can be transferred to refill team.   1. Which medications need to be refilled? (please list name of each medication and dose if known) carvedilol (COREG) 3.125 MG tablet  2. Which pharmacy/location (including street and city if local pharmacy) is medication to be sent to?[place], [place] - [address]  3. Do they need a 30 day or 90 day supply? [place]

## 2018-09-28 DIAGNOSIS — R7309 Other abnormal glucose: Secondary | ICD-10-CM | POA: Diagnosis not present

## 2018-09-28 DIAGNOSIS — R946 Abnormal results of thyroid function studies: Secondary | ICD-10-CM | POA: Diagnosis not present

## 2018-09-28 DIAGNOSIS — M109 Gout, unspecified: Secondary | ICD-10-CM | POA: Diagnosis not present

## 2018-09-28 DIAGNOSIS — E781 Pure hyperglyceridemia: Secondary | ICD-10-CM | POA: Diagnosis not present

## 2018-10-05 DIAGNOSIS — Z1331 Encounter for screening for depression: Secondary | ICD-10-CM | POA: Diagnosis not present

## 2018-10-05 DIAGNOSIS — Z9861 Coronary angioplasty status: Secondary | ICD-10-CM | POA: Diagnosis not present

## 2018-10-05 DIAGNOSIS — Z6822 Body mass index (BMI) 22.0-22.9, adult: Secondary | ICD-10-CM | POA: Diagnosis not present

## 2018-10-05 DIAGNOSIS — J439 Emphysema, unspecified: Secondary | ICD-10-CM | POA: Diagnosis not present

## 2018-10-05 DIAGNOSIS — I255 Ischemic cardiomyopathy: Secondary | ICD-10-CM | POA: Diagnosis not present

## 2018-10-05 DIAGNOSIS — R0981 Nasal congestion: Secondary | ICD-10-CM | POA: Diagnosis not present

## 2018-10-05 DIAGNOSIS — Z0000 Encounter for general adult medical examination without abnormal findings: Secondary | ICD-10-CM | POA: Diagnosis not present

## 2018-10-05 DIAGNOSIS — I4891 Unspecified atrial fibrillation: Secondary | ICD-10-CM | POA: Diagnosis not present

## 2018-10-05 DIAGNOSIS — I509 Heart failure, unspecified: Secondary | ICD-10-CM | POA: Diagnosis not present

## 2018-10-05 DIAGNOSIS — R946 Abnormal results of thyroid function studies: Secondary | ICD-10-CM | POA: Diagnosis not present

## 2018-10-05 DIAGNOSIS — I2721 Secondary pulmonary arterial hypertension: Secondary | ICD-10-CM | POA: Diagnosis not present

## 2018-10-05 DIAGNOSIS — I2510 Atherosclerotic heart disease of native coronary artery without angina pectoris: Secondary | ICD-10-CM | POA: Diagnosis not present

## 2018-10-05 DIAGNOSIS — I252 Old myocardial infarction: Secondary | ICD-10-CM | POA: Diagnosis not present

## 2018-10-08 DIAGNOSIS — Z1212 Encounter for screening for malignant neoplasm of rectum: Secondary | ICD-10-CM | POA: Diagnosis not present

## 2018-10-19 DIAGNOSIS — C61 Malignant neoplasm of prostate: Secondary | ICD-10-CM | POA: Diagnosis not present

## 2018-10-19 DIAGNOSIS — N3040 Irradiation cystitis without hematuria: Secondary | ICD-10-CM | POA: Diagnosis not present

## 2018-12-29 ENCOUNTER — Other Ambulatory Visit: Payer: Self-pay | Admitting: Cardiovascular Disease

## 2018-12-31 ENCOUNTER — Ambulatory Visit: Payer: Medicare Other | Admitting: Cardiovascular Disease

## 2019-02-21 ENCOUNTER — Other Ambulatory Visit: Payer: Self-pay | Admitting: Cardiovascular Disease

## 2019-02-21 NOTE — Telephone Encounter (Signed)
Rx(s) sent to pharmacy electronically.  

## 2019-03-23 DIAGNOSIS — N390 Urinary tract infection, site not specified: Secondary | ICD-10-CM | POA: Diagnosis not present

## 2019-03-23 DIAGNOSIS — I4891 Unspecified atrial fibrillation: Secondary | ICD-10-CM | POA: Diagnosis not present

## 2019-03-23 DIAGNOSIS — R310 Gross hematuria: Secondary | ICD-10-CM | POA: Diagnosis not present

## 2019-03-23 DIAGNOSIS — C61 Malignant neoplasm of prostate: Secondary | ICD-10-CM | POA: Diagnosis not present

## 2019-03-23 DIAGNOSIS — Z7901 Long term (current) use of anticoagulants: Secondary | ICD-10-CM | POA: Diagnosis not present

## 2019-03-23 DIAGNOSIS — R102 Pelvic and perineal pain: Secondary | ICD-10-CM | POA: Diagnosis not present

## 2019-03-24 ENCOUNTER — Other Ambulatory Visit: Payer: Self-pay | Admitting: Internal Medicine

## 2019-03-24 ENCOUNTER — Ambulatory Visit
Admission: RE | Admit: 2019-03-24 | Discharge: 2019-03-24 | Disposition: A | Discharge status: Home / Self Care | Payer: Medicare Other | Source: Ambulatory Visit | Attending: Internal Medicine | Admitting: Internal Medicine

## 2019-03-24 ENCOUNTER — Other Ambulatory Visit: Payer: Self-pay

## 2019-03-24 DIAGNOSIS — R102 Pelvic and perineal pain: Secondary | ICD-10-CM

## 2019-03-24 DIAGNOSIS — R319 Hematuria, unspecified: Secondary | ICD-10-CM

## 2019-03-24 DIAGNOSIS — R1032 Left lower quadrant pain: Secondary | ICD-10-CM | POA: Diagnosis not present

## 2019-03-24 MED ORDER — IOPAMIDOL (ISOVUE-300) INJECTION 61%
100.0000 mL | Freq: Once | INTRAVENOUS | Status: AC | PRN
  Administered 2019-03-24: 100 mL via INTRAVENOUS

## 2019-04-01 DIAGNOSIS — N3041 Irradiation cystitis with hematuria: Secondary | ICD-10-CM | POA: Diagnosis not present

## 2019-04-04 ENCOUNTER — Other Ambulatory Visit: Payer: Self-pay | Admitting: Cardiovascular Disease

## 2019-04-04 NOTE — Telephone Encounter (Signed)
Please schedule follow-up appointment.  Thank you.

## 2019-04-05 ENCOUNTER — Encounter: Payer: Self-pay | Admitting: Cardiovascular Disease

## 2019-04-05 ENCOUNTER — Ambulatory Visit (INDEPENDENT_AMBULATORY_CARE_PROVIDER_SITE_OTHER): Payer: Medicare Other | Admitting: Cardiovascular Disease

## 2019-04-05 ENCOUNTER — Other Ambulatory Visit: Payer: Self-pay

## 2019-04-05 VITALS — BP 130/56 | HR 54 | Ht 68.0 in | Wt 144.0 lb

## 2019-04-05 DIAGNOSIS — I255 Ischemic cardiomyopathy: Secondary | ICD-10-CM

## 2019-04-05 DIAGNOSIS — Z9861 Coronary angioplasty status: Secondary | ICD-10-CM | POA: Diagnosis not present

## 2019-04-05 DIAGNOSIS — Z008 Encounter for other general examination: Secondary | ICD-10-CM | POA: Diagnosis not present

## 2019-04-05 DIAGNOSIS — I509 Heart failure, unspecified: Secondary | ICD-10-CM | POA: Diagnosis not present

## 2019-04-05 DIAGNOSIS — I2109 ST elevation (STEMI) myocardial infarction involving other coronary artery of anterior wall: Secondary | ICD-10-CM | POA: Diagnosis not present

## 2019-04-05 DIAGNOSIS — E782 Mixed hyperlipidemia: Secondary | ICD-10-CM | POA: Diagnosis not present

## 2019-04-05 DIAGNOSIS — R310 Gross hematuria: Secondary | ICD-10-CM | POA: Diagnosis not present

## 2019-04-05 DIAGNOSIS — J439 Emphysema, unspecified: Secondary | ICD-10-CM | POA: Diagnosis not present

## 2019-04-05 DIAGNOSIS — Z7901 Long term (current) use of anticoagulants: Secondary | ICD-10-CM | POA: Diagnosis not present

## 2019-04-05 DIAGNOSIS — I4891 Unspecified atrial fibrillation: Secondary | ICD-10-CM | POA: Diagnosis not present

## 2019-04-05 DIAGNOSIS — R030 Elevated blood-pressure reading, without diagnosis of hypertension: Secondary | ICD-10-CM | POA: Diagnosis not present

## 2019-04-05 DIAGNOSIS — I700 Atherosclerosis of aorta: Secondary | ICD-10-CM | POA: Diagnosis not present

## 2019-04-05 DIAGNOSIS — R946 Abnormal results of thyroid function studies: Secondary | ICD-10-CM | POA: Diagnosis not present

## 2019-04-05 NOTE — Progress Notes (Signed)
04/05/2019 Pedro Pope.   Oct 09, 1940  [id number]  Primary Physician Shon Baton, MD Primary Cardiologist: Lorretta Harp MD [name], [place]  HPI:  Pedro Pope. is a 78 y.o.  thin appearing very Caucasian male father of 11, grandfather 7 grandchildren and great grandfather of 66 great grandchildren . He is a patient of [name]'s. I last saw him in the office 12/29/2017. 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 He basically had no risk factors prior to his presentation. He had an acute anterolateral infarction. By direct intervention by myself 10/30/14 with a drug-eluting stent in his proximal LAD. He had a left dominant system and had moderate proximal AV groove circumflex disease with high-grade nondominant RCA disease. Ejection fraction was in the 40-45% range with moderate anteroapical hypokinesia. He was discharged home 2 days after admission and was admitted several days later with A. Fib with RVR which she was symptomatic. His EKG showed changes and he has converted to sinus rhythm. Consideration was given to oral anticoagulation over the risks of bleeding were thought to outweigh the benefits. He is wearing an event monitor and has noticed several episodes of brief PAF. His beta blocker was discontinued because of bradycardia and hypotension. Recent Myoview showed dense scar in the LAD territory and 2-D echo revealed an EF of 30-35%. He participated in cardiac rehabilitation and is relatively asymptomatic. His most recent 2-D echo performed 04/19/15 showed improvement in his LV function up to the 40-45% range. I did send him to see Dr. Rayann Heman for consideration of ICD therapy and to discuss his A. Fib. He was ultimately placed on Xarelto oral anti-coagulation and his Luna Kitchens was transitioned to Plavix to mitigate his bleeding risk.  Since I saw him a year ago he is had some hematuria which is been worked up with an abdominal CT scan which  showed incidentally an occluded left iliac and questionable chronic distal abdominal aortic dissection.  The patient is completely asymptomatic from this.  He underwent [facility] at [facility] that did not reveal a bleeding source although they suggested that he begin on medication which has anticoagulation properties.  He denies chest pain or shortness of breath.    Current Meds  Medication Sig  . Ascorbic Acid (VITAMIN C) 500 MG tablet Take 500 mg by mouth daily.    Marland Kitchen atorvastatin (LIPITOR) 40 MG tablet TAKE 1 TABLET BY MOUTH ONCE DAILY AT  6PM  . Blood Pressure Monitor KIT For daily blood pressure monitoring;  Arm Cuff  . carvedilol (COREG) 3.125 MG tablet Take 1 tablet (3.125 mg total) by mouth 2 (two) times daily with a meal. NEED OV.  . clopidogrel (PLAVIX) 75 MG tablet TAKE 1 TABLET BY MOUTH EVERY DAY  . clopidogrel (PLAVIX) 75 MG tablet Take 1 tablet (75 mg total) by mouth daily. MUST KEEP APPOINTMENT 04/05/19 WITH DR Bernese Doffing FOR FUTURE REFILLS  . colchicine 0.6 MG tablet Take 1 tablet (0.6 mg total) by mouth daily. (Patient taking differently: Take 0.6 mg by mouth as needed. AS NEEDED FOR GOUT.)  . nitroGLYCERIN (NITROSTAT) 0.4 MG SL tablet Place 1 tablet (0.4 mg total) under the tongue every 5 (five) minutes as needed for chest pain.  Marland Kitchen XARELTO 20 MG TABS tablet TAKE 1 TABLET BY MOUTH ONCE DAILY WITH SUPPER     Allergies  Allergen Reactions  . Penicillins Anaphylaxis  . Corn Syrup [Glucose] Other (See Comments)    High  fructose corn syrup causes gout  . Aspirin Adult Low [Aspirin] Itching and Rash    Dr. [name] suspects that long term caused a rash, short term use appears to be ok    Social History   Socioeconomic History  . Marital status: Married    Spouse name: Not on file  . Number of children: Not on file  . Years of education: Not on file  . Highest education level: Not on file  Occupational History  . Not on file  Social Needs  . Financial resource strain: Not on file   . Food insecurity    Worry: Not on file    Inability: Not on file  . Transportation needs    Medical: Not on file    Non-medical: Not on file  Tobacco Use  . Smoking status: Former Smoker    Quit date: 08/18/1992    Years since quitting: 26.6  . Smokeless tobacco: Never Used  Substance and Sexual Activity  . Alcohol use: Yes    Comment: social  . Drug use: No  . Sexual activity: Not on file  Lifestyle  . Physical activity    Days per week: Not on file    Minutes per session: Not on file  . Stress: Not on file  Relationships  . Social Herbalist on phone: Not on file    Gets together: Not on file    Attends religious service: Not on file    Active member of club or organization: Not on file    Attends meetings of clubs or organizations: Not on file    Relationship status: Not on file  . Intimate partner violence    Fear of current or ex partner: Not on file    Emotionally abused: Not on file    Physically abused: Not on file    Forced sexual activity: Not on file  Other Topics Concern  . Not on file  Social History Narrative   Pt lives in [place] with spouse.  Realtor.  Retired Environmental education officer     Review of Systems: General: negative for chills, fever, night sweats or weight changes.  Cardiovascular: negative for chest pain, dyspnea on exertion, edema, orthopnea, palpitations, paroxysmal nocturnal dyspnea or shortness of breath Dermatological: negative for rash Respiratory: negative for cough or wheezing Urologic: negative for hematuria Abdominal: negative for nausea, vomiting, diarrhea, bright red blood per rectum, melena, or hematemesis Neurologic: negative for visual changes, syncope, or dizziness All other systems reviewed and are otherwise negative except as noted above.    Blood pressure (!) 130/56, pulse (!) 54, height _0  (1.727 m), weight 144 lb (65.3 kg), SpO2 97 %.  General appearance: alert and no distress Neck: no adenopathy, no carotid  bruit, no JVD, supple, symmetrical, trachea midline and thyroid not enlarged, symmetric, no tenderness/mass/nodules Lungs: clear to auscultation bilaterally Heart: regular rate and rhythm, S1, S2 normal, no murmur, click, rub or gallop Extremities: extremities normal, atraumatic, no cyanosis or edema Pulses: 2+ and symmetric Skin: Skin color, texture, turgor normal. No rashes or lesions Neurologic: Alert and oriented X 3, normal strength and tone. Normal symmetric reflexes. Normal coordination and gait  EKG sinus bradycardia with poor R wave progression, inferior Q waves and anterolateral T wave inversion.  I personally reviewed reviewed this EKG.  ASSESSMENT AND PLAN:   ST elevation myocardial infarction (STEMI) involving other coronary artery of anterior wall - STEMI s/p DES to proximal LAD; residual 80 % proximal and 60% mid  LCx diease History of CAD status post anterolateral STEMI treated with direct intervention by myself 10/30/2014 with a drug-eluting stent to the proximal LAD.  He had a left dominant system with moderate high-grade nondominant RCA stenosis, EF of 40 to 45%.  He remains pain-free on clopidogrel.  Hyperlipidemia History of hyperlipidemia on statin therapy with lipid profile performed 09/28/2018 revealing total cholesterol 90, LDL 41 HDL 32.  PAF post MI History of PAF maintaining sinus rhythm on Eliquis oral anticoagulation.  Ischemic cardiomyopathy History of ischemic cardiomyopathy with an EF September 2016 and 40 to 45%.  Patient is asymptomatic on appropriate medications.      Lorretta Harp MD FACP,FACC,FAHA, [facility] 04/05/2019 4:14 PM

## 2019-04-05 NOTE — Assessment & Plan Note (Signed)
History of hyperlipidemia on statin therapy with lipid profile performed 09/28/2018 revealing total cholesterol 90, LDL 41 HDL 32.

## 2019-04-05 NOTE — Assessment & Plan Note (Signed)
History of CAD status post anterolateral STEMI treated with direct intervention by myself 10/30/2014 with a drug-eluting stent to the proximal LAD.  He had a left dominant system with moderate high-grade nondominant RCA stenosis, EF of 40 to 45%.  He remains pain-free on clopidogrel.

## 2019-04-05 NOTE — Patient Instructions (Addendum)
Medication Instructions:  Your physician recommends that you continue on your current medications as directed. Please refer to the Current Medication list given to you today.  If you need a refill on your cardiac medications before your next appointment, please call your pharmacy.   Lab work: none If you have labs (blood work) drawn today and your tests are completely normal, you will receive your results only by: Marland Kitchen MyChart Message (if you have MyChart) OR . A paper copy in the mail If you have any lab test that is abnormal or we need to change your treatment, we will call you to review the results.  Testing/Procedures: none  Follow-Up: At [facility], you and your health needs are our priority.  As part of our continuing mission to provide you with exceptional heart care, we have created designated Provider Care Teams.  These Care Teams include your primary Cardiologist (physician) and Advanced Practice Providers (APPs -  Physician Assistants and Nurse Practitioners) who all work together to provide you with the care you need, when you need it. You will need a follow up appointment in 12 months with Dr. Quay Burow.  Please call our office 2 months in advance to schedule this appointment.

## 2019-04-05 NOTE — Assessment & Plan Note (Signed)
History of PAF maintaining sinus rhythm on Eliquis oral anticoagulation. 

## 2019-04-05 NOTE — Assessment & Plan Note (Signed)
History of ischemic cardiomyopathy with an EF September 2016 and 40 to 45%.  Patient is asymptomatic on appropriate medications.

## 2019-04-18 ENCOUNTER — Other Ambulatory Visit: Payer: Self-pay | Admitting: Cardiovascular Disease

## 2019-04-18 NOTE — Telephone Encounter (Signed)
Please review for refill. Thank you! 

## 2019-06-10 ENCOUNTER — Other Ambulatory Visit: Payer: Self-pay | Admitting: Cardiovascular Disease

## 2019-06-28 ENCOUNTER — Other Ambulatory Visit: Payer: Self-pay | Admitting: Cardiovascular Disease

## 2019-09-13 ENCOUNTER — Other Ambulatory Visit: Payer: Self-pay

## 2019-09-13 MED ORDER — CARVEDILOL 3.125 MG PO TABS: 3.1250 mg | ORAL_TABLET | Freq: Two times a day (BID) | ORAL | 0 refills | Status: DC

## 2019-10-03 DIAGNOSIS — M109 Gout, unspecified: Secondary | ICD-10-CM | POA: Diagnosis not present

## 2019-10-03 DIAGNOSIS — E781 Pure hyperglyceridemia: Secondary | ICD-10-CM | POA: Diagnosis not present

## 2019-10-03 DIAGNOSIS — R739 Hyperglycemia, unspecified: Secondary | ICD-10-CM | POA: Diagnosis not present

## 2019-10-10 DIAGNOSIS — I2721 Secondary pulmonary arterial hypertension: Secondary | ICD-10-CM | POA: Diagnosis not present

## 2019-10-10 DIAGNOSIS — I2510 Atherosclerotic heart disease of native coronary artery without angina pectoris: Secondary | ICD-10-CM | POA: Diagnosis not present

## 2019-10-10 DIAGNOSIS — R310 Gross hematuria: Secondary | ICD-10-CM | POA: Diagnosis not present

## 2019-10-10 DIAGNOSIS — Z9861 Coronary angioplasty status: Secondary | ICD-10-CM | POA: Diagnosis not present

## 2019-10-10 DIAGNOSIS — I255 Ischemic cardiomyopathy: Secondary | ICD-10-CM | POA: Diagnosis not present

## 2019-10-10 DIAGNOSIS — I4891 Unspecified atrial fibrillation: Secondary | ICD-10-CM | POA: Diagnosis not present

## 2019-10-10 DIAGNOSIS — Z7901 Long term (current) use of anticoagulants: Secondary | ICD-10-CM | POA: Diagnosis not present

## 2019-10-10 DIAGNOSIS — I509 Heart failure, unspecified: Secondary | ICD-10-CM | POA: Diagnosis not present

## 2019-10-10 DIAGNOSIS — Z0000 Encounter for general adult medical examination without abnormal findings: Secondary | ICD-10-CM | POA: Diagnosis not present

## 2019-10-10 DIAGNOSIS — I700 Atherosclerosis of aorta: Secondary | ICD-10-CM | POA: Diagnosis not present

## 2019-10-10 DIAGNOSIS — J439 Emphysema, unspecified: Secondary | ICD-10-CM | POA: Diagnosis not present

## 2019-10-10 DIAGNOSIS — R008 Other abnormalities of heart beat: Secondary | ICD-10-CM | POA: Diagnosis not present

## 2019-10-12 DIAGNOSIS — K921 Melena: Secondary | ICD-10-CM | POA: Diagnosis not present

## 2019-10-14 ENCOUNTER — Ambulatory Visit: Payer: Medicare Other | Attending: Internal Medicine

## 2019-10-14 DIAGNOSIS — Z23 Encounter for immunization: Secondary | ICD-10-CM

## 2019-10-14 NOTE — Progress Notes (Signed)
   [id number]  Name:  Pedro Pope.    MRN: [id number] DOB: 1940-10-23  10/14/2019  Pedro Pope was observed post Covid-19 immunization for 30 minutes based on pre-vaccination screening without incidence. He was provided with Vaccine Information Sheet and instruction to access the V-Safe system.   Pedro Pope was instructed to call 911 with any severe reactions post vaccine: Marland Kitchen Difficulty breathing  . Swelling of your face and throat  . A fast heartbeat  . A bad rash all over your body  . Dizziness and weakness    Immunizations Administered    Name Date Dose VIS Date Route   Pfizer COVID-19 Vaccine 10/14/2019  1:12 PM 0.3 mL 07/29/2019 Intramuscular   Manufacturer: [place]   Lot: [id number]   [place]: [address]-5

## 2019-10-25 DIAGNOSIS — R972 Elevated prostate specific antigen [PSA]: Secondary | ICD-10-CM | POA: Diagnosis not present

## 2019-10-25 DIAGNOSIS — C61 Malignant neoplasm of prostate: Secondary | ICD-10-CM | POA: Diagnosis not present

## 2019-10-25 DIAGNOSIS — N3040 Irradiation cystitis without hematuria: Secondary | ICD-10-CM | POA: Diagnosis not present

## 2019-10-25 DIAGNOSIS — N5231 Erectile dysfunction following radical prostatectomy: Secondary | ICD-10-CM | POA: Diagnosis not present

## 2019-11-05 ENCOUNTER — Other Ambulatory Visit: Payer: Self-pay | Admitting: Cardiovascular Disease

## 2019-11-08 ENCOUNTER — Ambulatory Visit: Payer: Medicare Other | Attending: Internal Medicine

## 2019-11-08 DIAGNOSIS — Z23 Encounter for immunization: Secondary | ICD-10-CM

## 2019-11-08 NOTE — Progress Notes (Signed)
   [id number]  Name:  Rocco Kerkhoff.    MRN: [id number] DOB: 1941/05/31  11/08/2019  Mr. Fidalgo was observed post Covid-19 immunization for 30 minutes based on pre-vaccination screening without incident. He was provided with Vaccine Information Sheet and instruction to access the V-Safe system.   Mr. Nall was instructed to call 911 with any severe reactions post vaccine: Marland Kitchen Difficulty breathing  . Swelling of face and throat  . A fast heartbeat  . A bad rash all over body  . Dizziness and weakness   Immunizations Administered    Name Date Dose VIS Date Route   Pfizer COVID-19 Vaccine 11/08/2019  3:43 PM 0.3 mL 07/29/2019 Intramuscular   Manufacturer: [place]   Lot: [id number]   [place]: [id number]

## 2019-11-22 DIAGNOSIS — Z1212 Encounter for screening for malignant neoplasm of rectum: Secondary | ICD-10-CM | POA: Diagnosis not present

## 2019-12-06 ENCOUNTER — Other Ambulatory Visit: Payer: Self-pay | Admitting: Cardiovascular Disease

## 2019-12-19 ENCOUNTER — Other Ambulatory Visit: Payer: Self-pay | Admitting: Cardiovascular Disease

## 2019-12-19 NOTE — Telephone Encounter (Signed)
Rx(s) sent to pharmacy electronically. Last OV August 2020

## 2020-03-13 ENCOUNTER — Other Ambulatory Visit: Payer: Self-pay | Admitting: Cardiovascular Disease

## 2020-04-10 DIAGNOSIS — I2729 Other secondary pulmonary hypertension: Secondary | ICD-10-CM | POA: Diagnosis not present

## 2020-04-10 DIAGNOSIS — I2510 Atherosclerotic heart disease of native coronary artery without angina pectoris: Secondary | ICD-10-CM | POA: Diagnosis not present

## 2020-04-10 DIAGNOSIS — Z7901 Long term (current) use of anticoagulants: Secondary | ICD-10-CM | POA: Diagnosis not present

## 2020-04-10 DIAGNOSIS — J439 Emphysema, unspecified: Secondary | ICD-10-CM | POA: Diagnosis not present

## 2020-04-10 DIAGNOSIS — R0789 Other chest pain: Secondary | ICD-10-CM | POA: Diagnosis not present

## 2020-04-10 DIAGNOSIS — I4891 Unspecified atrial fibrillation: Secondary | ICD-10-CM | POA: Diagnosis not present

## 2020-04-10 DIAGNOSIS — E781 Pure hyperglyceridemia: Secondary | ICD-10-CM | POA: Diagnosis not present

## 2020-04-10 DIAGNOSIS — I700 Atherosclerosis of aorta: Secondary | ICD-10-CM | POA: Diagnosis not present

## 2020-04-10 DIAGNOSIS — C61 Malignant neoplasm of prostate: Secondary | ICD-10-CM | POA: Diagnosis not present

## 2020-04-10 DIAGNOSIS — I255 Ischemic cardiomyopathy: Secondary | ICD-10-CM | POA: Diagnosis not present

## 2020-04-10 DIAGNOSIS — K760 Fatty (change of) liver, not elsewhere classified: Secondary | ICD-10-CM | POA: Diagnosis not present

## 2020-04-10 DIAGNOSIS — M109 Gout, unspecified: Secondary | ICD-10-CM | POA: Diagnosis not present

## 2020-04-30 ENCOUNTER — Other Ambulatory Visit: Payer: Self-pay

## 2020-04-30 ENCOUNTER — Ambulatory Visit (INDEPENDENT_AMBULATORY_CARE_PROVIDER_SITE_OTHER): Payer: Medicare Other | Admitting: Cardiovascular Disease

## 2020-04-30 ENCOUNTER — Encounter: Payer: Self-pay | Admitting: Cardiovascular Disease

## 2020-04-30 VITALS — BP 118/62 | HR 53 | Ht 68.0 in | Wt 145.0 lb

## 2020-04-30 DIAGNOSIS — I2109 ST elevation (STEMI) myocardial infarction involving other coronary artery of anterior wall: Secondary | ICD-10-CM | POA: Diagnosis not present

## 2020-04-30 DIAGNOSIS — I480 Paroxysmal atrial fibrillation: Secondary | ICD-10-CM

## 2020-04-30 DIAGNOSIS — E782 Mixed hyperlipidemia: Secondary | ICD-10-CM

## 2020-04-30 DIAGNOSIS — I255 Ischemic cardiomyopathy: Secondary | ICD-10-CM | POA: Diagnosis not present

## 2020-04-30 NOTE — Assessment & Plan Note (Signed)
History of ischemic heart cardiomyopathy with an EF in the 40 to 45% range on carvedilol low-dose although he is somewhat bradycardic.  He has no symptoms of heart failure

## 2020-04-30 NOTE — Assessment & Plan Note (Signed)
History of CAD status post anterior STEMI treated with direct intervention by myself 10/30/2014 of his proximal LAD with drug-eluting stent.  He did have a left dominant system and had moderate proximal AV groove circumflex stenosis with high-grade nondominant RCA stenosis.  His EF initially was 40 to 45% with moderate anteroapical hypokinesia.  He is on clopidogrel.  He had one episode of chest pain approximately 5 months ago which resolved after 2 sublingual nitroglycerin and has had no recurrent symptoms.

## 2020-04-30 NOTE — Assessment & Plan Note (Signed)
History of PAF maintaining sinus rhythm on Xarelto oral anticoagulation.

## 2020-04-30 NOTE — Assessment & Plan Note (Signed)
History of hyperlipidemia on statin therapy with lipid profile performed 10/03/2019 revealing total cholesterol of 96, LDL 46 and HDL of 38.

## 2020-04-30 NOTE — Patient Instructions (Signed)
Medication Instructions:  Your physician recommends that you continue on your current medications as directed. Please refer to the Current Medication list given to you today.  *If you need a refill on your cardiac medications before your next appointment, please call your pharmacy*   Follow-Up: At [facility], you and your health needs are our priority.  As part of our continuing mission to provide you with exceptional heart care, we have created designated Provider Care Teams.  These Care Teams include your primary Cardiologist (physician) and Advanced Practice Providers (APPs -  Physician Assistants and Nurse Practitioners) who all work together to provide you with the care you need, when you need it.  We recommend signing up for the patient portal called "MyChart".  Sign up information is provided on this After Visit Summary.  MyChart is used to connect with patients for Virtual Visits (Telemedicine).  Patients are able to view lab/test results, encounter notes, upcoming appointments, etc.  Non-urgent messages can be sent to your provider as well.   To learn more about what you can do with MyChart, go to [email or web address].    Your next appointment:   12 month(s)  The format for your next appointment:   In Person  Provider:   You may see Quay Burow, MD or one of the following Advanced Practice Providers on your designated Care Team:    Kerin Ransom, PA-C  [place], [place]  Coletta Memos, [place]    Other Instructions Please call 2 months in advance to schedule your follow-up appointment with Dr. Gwenlyn Found.

## 2020-04-30 NOTE — Progress Notes (Signed)
04/30/2020 Pedro Pope.   1940-08-25  [id number]  Primary Physician Shon Baton, MD Primary Cardiologist: Lorretta Harp MD [name], [place]  HPI:  Pedro Pope. is a 79 y.o.   thin appearing very Caucasian male father of 14, grandfather 7 grandchildren and great grandfather of 34 great grandchildren . He is a patient of [name]'s. I last saw him in the office 04/05/2019. 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 He basically had no risk factors prior to his presentation. He had an acute anterolateral infarction. By direct intervention by myself 10/30/14 with a drug-eluting stent in his proximal LAD. He had a left dominant system and had moderate proximal AV groove circumflex disease with high-grade nondominant RCA disease. Ejection fraction was in the 40-45% range with moderate anteroapical hypokinesia. He was discharged home 2 days after admission and was admitted several days later with A. Fib with RVR which she was symptomatic. His EKG showed changes and he has converted to sinus rhythm. Consideration was given to oral anticoagulation over the risks of bleeding were thought to outweigh the benefits. He is wearing an event monitor and has noticed several episodes of brief PAF. His beta blocker was discontinued because of bradycardia and hypotension. Recent Myoview showed dense scar in the LAD territory and 2-D echo revealed an EF of 30-35%. He participated in cardiac rehabilitation and is relatively asymptomatic. His most recent 2-D echo performed 04/19/15 showed improvement in his LV function up to the 40-45% range. I did send him to see Dr. Rayann Heman for consideration of ICD therapy and to discuss his A. Fib. He was ultimately placed on Xarelto oral anti-coagulation and his Luna Kitchens was transitioned to Plavix to mitigate his bleeding risk.  Since I saw him a year ago he continues to do well.  He had one episode of chest pain approxi-5 months ago  at night which resolved after 2 sublingual nitroglycerin.  He denies chest pain shortness of breath or claudication.  He apparently did have a CT scan at [facility] remotely that suggested a left iliac occlusion..   Current Meds  Medication Sig  . Ascorbic Acid (VITAMIN C) 500 MG tablet Take 500 mg by mouth daily.    Marland Kitchen atorvastatin (LIPITOR) 40 MG tablet TAKE 1 TABLET BY MOUTH EVERY DAY AT 6PM  . Blood Pressure Monitor KIT For daily blood pressure monitoring;  Arm Cuff  . carvedilol (COREG) 3.125 MG tablet TAKE 1 TABLET BY MOUTH TWICE A DAY WITH A MEAL  . clopidogrel (PLAVIX) 75 MG tablet TAKE 1 TABLET BY MOUTH EVERY DAY  . clopidogrel (PLAVIX) 75 MG tablet TAKE 1 TABLET BY MOUTH ONCE DAILY . APPOINTMENT REQUIRED FOR FUTURE REFILLS (04/05/19  WITH  DR  Shalynn Jorstad)  . clopidogrel (PLAVIX) 75 MG tablet TAKE 1 TABLET BY MOUTH EVERY DAY  . colchicine 0.6 MG tablet Take 1 tablet (0.6 mg total) by mouth daily. (Patient taking differently: Take 0.6 mg by mouth as needed. AS NEEDED FOR GOUT.)  . nitroGLYCERIN (NITROSTAT) 0.4 MG SL tablet Place 1 tablet (0.4 mg total) under the tongue every 5 (five) minutes as needed for chest pain.  Marland Kitchen XARELTO 20 MG TABS tablet TAKE 1 TABLET BY MOUTH EVERY DAY WITH SUPPER     Allergies  Allergen Reactions  . Penicillins Anaphylaxis  . Corn Syrup [Glucose] Other (See Comments)    High fructose corn syrup causes gout  . Aspirin Adult Low [Aspirin] Itching  and Rash    Dr. [name] suspects that long term caused a rash, short term use appears to be ok    Social History   Socioeconomic History  . Marital status: Married    Spouse name: Not on file  . Number of children: Not on file  . Years of education: Not on file  . Highest education level: Not on file  Occupational History  . Not on file  Tobacco Use  . Smoking status: Former Smoker    Quit date: 08/18/1992    Years since quitting: 27.7  . Smokeless tobacco: Never Used  Substance and Sexual Activity  . Alcohol use:  Yes    Comment: social  . Drug use: No  . Sexual activity: Not on file  Other Topics Concern  . Not on file  Social History Narrative   Pt lives in [place] with spouse.  Realtor.  Retired Environmental education officer   Social Determinants of Radio broadcast assistant Strain:   . Difficulty of Paying Living Expenses: Not on file  Food Insecurity:   . Worried About Charity fundraiser in the Last Year: Not on file  . Ran Out of Food in the Last Year: Not on file  Transportation Needs:   . Lack of Transportation (Medical): Not on file  . Lack of Transportation (Non-Medical): Not on file  Physical Activity:   . Days of Exercise per Week: Not on file  . Minutes of Exercise per Session: Not on file  Stress:   . Feeling of Stress : Not on file  Social Connections:   . Frequency of Communication with Friends and Family: Not on file  . Frequency of Social Gatherings with Friends and Family: Not on file  . Attends Religious Services: Not on file  . Active Member of Clubs or Organizations: Not on file  . Attends Archivist Meetings: Not on file  . Marital Status: Not on file  Intimate Partner Violence:   . Fear of Current or Ex-Partner: Not on file  . Emotionally Abused: Not on file  . Physically Abused: Not on file  . Sexually Abused: Not on file     Review of Systems: General: negative for chills, fever, night sweats or weight changes.  Cardiovascular: negative for chest pain, dyspnea on exertion, edema, orthopnea, palpitations, paroxysmal nocturnal dyspnea or shortness of breath Dermatological: negative for rash Respiratory: negative for cough or wheezing Urologic: negative for hematuria Abdominal: negative for nausea, vomiting, diarrhea, bright red blood per rectum, melena, or hematemesis Neurologic: negative for visual changes, syncope, or dizziness All other systems reviewed and are otherwise negative except as noted above.    Blood pressure 118/62, pulse (!) 53, height 5'  8" (1.727 m), weight 145 lb (65.8 kg).  General appearance: alert and no distress Neck: no adenopathy, no carotid bruit, no JVD, supple, symmetrical, trachea midline and thyroid not enlarged, symmetric, no tenderness/mass/nodules Lungs: clear to auscultation bilaterally Heart: regular rate and rhythm, S1, S2 normal, no murmur, click, rub or gallop Extremities: extremities normal, atraumatic, no cyanosis or edema Pulses: 2+ and symmetric Skin: Skin color, texture, turgor normal. No rashes or lesions Neurologic: Alert and oriented X 3, normal strength and tone. Normal symmetric reflexes. Normal coordination and gait  EKG sinus bradycardia at 53 with poor R wave progression and anterolateral T wave inversion.  There were inferior Q waves as well.  This was unchanged from his prior EKGs.  I personally reviewed this EKG.  ASSESSMENT AND PLAN:  ST elevation myocardial infarction (STEMI) involving other coronary artery of anterior wall - STEMI s/p DES to proximal LAD; residual 80 % proximal and 60% mid LCx diease History of CAD status post anterior STEMI treated with direct intervention by myself 10/30/2014 of his proximal LAD with drug-eluting stent.  He did have a left dominant system and had moderate proximal AV groove circumflex stenosis with high-grade nondominant RCA stenosis.  His EF initially was 40 to 45% with moderate anteroapical hypokinesia.  He is on clopidogrel.  He had one episode of chest pain approximately 5 months ago which resolved after 2 sublingual nitroglycerin and has had no recurrent symptoms.  Hyperlipidemia History of hyperlipidemia on statin therapy with lipid profile performed 10/03/2019 revealing total cholesterol of 96, LDL 46 and HDL of 38.  Ischemic cardiomyopathy History of ischemic heart cardiomyopathy with an EF in the 40 to 45% range on carvedilol low-dose although he is somewhat bradycardic.  He has no symptoms of heart failure  PAF post MI History of PAF  maintaining sinus rhythm on Xarelto oral anticoagulation.      Lorretta Harp MD FACP,FACC,FAHA, [facility] 04/30/2020 11:32 AM

## 2020-05-20 ENCOUNTER — Other Ambulatory Visit: Payer: Self-pay

## 2020-05-20 ENCOUNTER — Ambulatory Visit (HOSPITAL_COMMUNITY)
Admission: RE | Admit: 2020-05-20 | Discharge: 2020-05-20 | Disposition: A | Discharge status: Home / Self Care | Payer: Medicare Other | Source: Ambulatory Visit | Attending: Internal Medicine | Admitting: Internal Medicine

## 2020-05-20 NOTE — ED Notes (Signed)
Pedro Pope, patient access reports patient requested dr lamptey.  Dr Lanny Cramp not here today.  Patient then asked for a male provider.  No male providers on duty today.  Patient left saying he would check back.

## 2020-05-23 ENCOUNTER — Other Ambulatory Visit: Payer: Self-pay | Admitting: Cardiovascular Disease

## 2020-05-25 ENCOUNTER — Ambulatory Visit (HOSPITAL_COMMUNITY): Payer: Self-pay

## 2020-05-25 DIAGNOSIS — L02214 Cutaneous abscess of groin: Secondary | ICD-10-CM | POA: Diagnosis not present

## 2020-05-27 DIAGNOSIS — L02214 Cutaneous abscess of groin: Secondary | ICD-10-CM | POA: Diagnosis not present

## 2020-05-31 ENCOUNTER — Other Ambulatory Visit: Payer: Self-pay | Admitting: Cardiovascular Disease

## 2020-06-03 ENCOUNTER — Other Ambulatory Visit: Payer: Self-pay | Admitting: Cardiovascular Disease

## 2020-07-16 DIAGNOSIS — Z23 Encounter for immunization: Secondary | ICD-10-CM | POA: Diagnosis not present

## 2020-07-26 ENCOUNTER — Other Ambulatory Visit: Payer: Self-pay | Admitting: Cardiovascular Disease

## 2020-08-07 DIAGNOSIS — Z23 Encounter for immunization: Secondary | ICD-10-CM | POA: Diagnosis not present

## 2020-09-04 ENCOUNTER — Other Ambulatory Visit: Payer: Self-pay | Admitting: Cardiovascular Disease

## 2020-10-08 DIAGNOSIS — R946 Abnormal results of thyroid function studies: Secondary | ICD-10-CM | POA: Diagnosis not present

## 2020-10-08 DIAGNOSIS — R739 Hyperglycemia, unspecified: Secondary | ICD-10-CM | POA: Diagnosis not present

## 2020-10-08 DIAGNOSIS — M109 Gout, unspecified: Secondary | ICD-10-CM | POA: Diagnosis not present

## 2020-10-08 DIAGNOSIS — E781 Pure hyperglyceridemia: Secondary | ICD-10-CM | POA: Diagnosis not present

## 2020-10-08 DIAGNOSIS — I959 Hypotension, unspecified: Secondary | ICD-10-CM | POA: Diagnosis not present

## 2020-10-15 DIAGNOSIS — M109 Gout, unspecified: Secondary | ICD-10-CM | POA: Diagnosis not present

## 2020-10-15 DIAGNOSIS — I4891 Unspecified atrial fibrillation: Secondary | ICD-10-CM | POA: Diagnosis not present

## 2020-10-15 DIAGNOSIS — K760 Fatty (change of) liver, not elsewhere classified: Secondary | ICD-10-CM | POA: Diagnosis not present

## 2020-10-15 DIAGNOSIS — I272 Pulmonary hypertension, unspecified: Secondary | ICD-10-CM | POA: Diagnosis not present

## 2020-10-15 DIAGNOSIS — I255 Ischemic cardiomyopathy: Secondary | ICD-10-CM | POA: Diagnosis not present

## 2020-10-15 DIAGNOSIS — C61 Malignant neoplasm of prostate: Secondary | ICD-10-CM | POA: Diagnosis not present

## 2020-10-15 DIAGNOSIS — J439 Emphysema, unspecified: Secondary | ICD-10-CM | POA: Diagnosis not present

## 2020-10-15 DIAGNOSIS — I2510 Atherosclerotic heart disease of native coronary artery without angina pectoris: Secondary | ICD-10-CM | POA: Diagnosis not present

## 2020-10-15 DIAGNOSIS — E781 Pure hyperglyceridemia: Secondary | ICD-10-CM | POA: Diagnosis not present

## 2020-10-15 DIAGNOSIS — Z9861 Coronary angioplasty status: Secondary | ICD-10-CM | POA: Diagnosis not present

## 2020-10-15 DIAGNOSIS — Z0000 Encounter for general adult medical examination without abnormal findings: Secondary | ICD-10-CM | POA: Diagnosis not present

## 2020-10-15 DIAGNOSIS — I700 Atherosclerosis of aorta: Secondary | ICD-10-CM | POA: Diagnosis not present

## 2020-10-15 DIAGNOSIS — Z1212 Encounter for screening for malignant neoplasm of rectum: Secondary | ICD-10-CM | POA: Diagnosis not present

## 2020-10-23 DIAGNOSIS — C61 Malignant neoplasm of prostate: Secondary | ICD-10-CM | POA: Diagnosis not present

## 2020-10-23 DIAGNOSIS — R972 Elevated prostate specific antigen [PSA]: Secondary | ICD-10-CM | POA: Diagnosis not present

## 2020-10-23 DIAGNOSIS — N3040 Irradiation cystitis without hematuria: Secondary | ICD-10-CM | POA: Diagnosis not present

## 2020-10-30 ENCOUNTER — Other Ambulatory Visit: Payer: Self-pay | Admitting: Cardiovascular Disease

## 2020-11-23 ENCOUNTER — Other Ambulatory Visit: Payer: Self-pay | Admitting: Cardiovascular Disease

## 2020-11-23 NOTE — Telephone Encounter (Signed)
Prescription refill request for Xarelto received.  Indication: atrial fib Last office visit:9/21  berry Weight:65.8 kg Age:80 Scr: needs labs CrCl: needs labs  Prescription refilled

## 2020-12-29 ENCOUNTER — Other Ambulatory Visit: Payer: Self-pay | Admitting: Cardiovascular Disease

## 2021-01-27 ENCOUNTER — Other Ambulatory Visit: Payer: Self-pay | Admitting: Cardiovascular Disease

## 2021-04-11 ENCOUNTER — Emergency Department (HOSPITAL_COMMUNITY)
Admission: EM | Admit: 2021-04-11 | Discharge: 2021-04-11 | Disposition: A | Discharge status: Home / Self Care | Payer: Medicare Other | Attending: Emergency Medicine | Admitting: Emergency Medicine

## 2021-04-11 ENCOUNTER — Other Ambulatory Visit: Payer: Self-pay

## 2021-04-11 ENCOUNTER — Encounter (HOSPITAL_COMMUNITY): Payer: Self-pay | Admitting: Emergency Medicine

## 2021-04-11 DIAGNOSIS — R310 Gross hematuria: Secondary | ICD-10-CM

## 2021-04-11 DIAGNOSIS — Z87891 Personal history of nicotine dependence: Secondary | ICD-10-CM | POA: Diagnosis not present

## 2021-04-11 DIAGNOSIS — D500 Iron deficiency anemia secondary to blood loss (chronic): Secondary | ICD-10-CM | POA: Diagnosis not present

## 2021-04-11 DIAGNOSIS — Z8546 Personal history of malignant neoplasm of prostate: Secondary | ICD-10-CM | POA: Insufficient documentation

## 2021-04-11 DIAGNOSIS — R339 Retention of urine, unspecified: Secondary | ICD-10-CM | POA: Insufficient documentation

## 2021-04-11 DIAGNOSIS — I2510 Atherosclerotic heart disease of native coronary artery without angina pectoris: Secondary | ICD-10-CM | POA: Diagnosis not present

## 2021-04-11 LAB — BASIC METABOLIC PANEL
Anion gap: 7 (ref 5–15)
BUN: 18 mg/dL (ref 8–23)
CO2: 21 mmol/L — ABNORMAL LOW (ref 22–32)
Calcium: 8.7 mg/dL — ABNORMAL LOW (ref 8.9–10.3)
Chloride: 107 mmol/L (ref 98–111)
Creatinine, Ser: 1.11 mg/dL (ref 0.61–1.24)
GFR, Estimated: >60 mL/min (ref >60)
Glucose, Bld: 138 mg/dL — ABNORMAL HIGH (ref 70–99)
Potassium: 5 mmol/L (ref 3.5–5.1)
Sodium: 135 mmol/L (ref 135–145)

## 2021-04-11 LAB — CBC WITH DIFFERENTIAL/PLATELET
Abs Immature Granulocytes: 0.05 10*3/uL (ref 0.00–0.07)
Basophils Absolute: 0 10*3/uL (ref 0.0–0.1)
Basophils Relative: 0 %
Eosinophils Absolute: 0.1 10*3/uL (ref 0.0–0.5)
Eosinophils Relative: 1 %
HCT: 27.2 % — ABNORMAL LOW (ref 39.0–52.0)
Hemoglobin: 8 g/dL — ABNORMAL LOW (ref 13.0–17.0)
Immature Granulocytes: 0 %
Lymphocytes Relative: 15 %
Lymphs Abs: 1.8 10*3/uL (ref 0.7–4.0)
MCH: 30.1 pg (ref 26.0–34.0)
MCHC: 29.4 g/dL — ABNORMAL LOW (ref 30.0–36.0)
MCV: 102.3 fL — ABNORMAL HIGH (ref 80.0–100.0)
Monocytes Absolute: 0.8 10*3/uL (ref 0.1–1.0)
Monocytes Relative: 7 %
Neutro Abs: 9.1 10*3/uL — ABNORMAL HIGH (ref 1.7–7.7)
Neutrophils Relative %: 77 %
Platelets: 332 10*3/uL (ref 150–400)
RBC: 2.66 MIL/uL — ABNORMAL LOW (ref 4.22–5.81)
RDW: 15 % (ref 11.5–15.5)
WBC: 11.8 10*3/uL — ABNORMAL HIGH (ref 4.0–10.5)
nRBC: 0 % (ref 0.0–0.2)

## 2021-04-11 MED ORDER — SODIUM CHLORIDE 0.9 % IV SOLN
INTRAVENOUS | Status: DC
  Filled 2021-04-11: qty 40

## 2021-04-11 MED ORDER — SODIUM CHLORIDE 0.9 % IR SOLN: 3000.0000 mL | Status: DC

## 2021-04-11 NOTE — Discharge Instructions (Addendum)
You have an appointment with Dr. Darcus Austin on August 30 at 10:15 am  If you develop new or worsening bleeding, inability to urinate, dizziness or lightheadedness, or any other new/concerning symptoms then return to the ER or call 911.

## 2021-04-11 NOTE — ED Triage Notes (Signed)
Patient complains of hematuria and dysuria for the last two weeks. States his physician at [facility] prescribed him the same medication that worked last time these symptoms occurred but the pain and bleeding have persisted. Patient states physician at [facility] recommended to go to a local emergency department to be catheterized.

## 2021-04-11 NOTE — ED Provider Notes (Signed)
Emergency Medicine Provider Triage Evaluation Note  Pedro Pope. , a 80 y.o. male  was evaluated in triage.  Pt complains of urinary retention.  Review of Systems  Positive: Suprapubic pressure, hematuria with clots, urinary retention Negative: Fever, constipation  Physical Exam  BP (!) 157/60 (BP Location: Right Arm)   Pulse 93   Temp 98.8 F (37.1 C) (Oral)   Resp 18   SpO2 100%  Gen:   Awake, standing, pacing, appears uncomfortable Resp:  Normal effort  MSK:   Moves extremities without difficulty  Other:  Ttp to suprapupic.  Holding urinal with blood   Medical Decision Making  Medically screening exam initiated at 12:47 PM.  Appropriate orders placed.  Pedro Pope. was informed that the remainder of the evaluation will be completed by another provider, this initial triage assessment does not replace that evaluation, and the importance of remaining in the ED until their evaluation is complete.  Pt has had hematuria x 2 weeks and progressive worsening urinary retention x 1 week.  Has remote hx of prostate CA with radiation treatment.  Need foley insertion and bladder irrigation.    Domenic Moras, PA-C 04/11/21 [place], MD 04/11/21 1600

## 2021-04-11 NOTE — ED Provider Notes (Signed)
[name] EMERGENCY DEPARTMENT Provider Note   CSN: [id number] Arrival date & time: 04/11/21  1154     History Chief Complaint  Patient presents with   Urinary Retention    Pedro Pope. is a 79 y.o. male.  HPI 80 year old male presents with hematuria and urinary retention.  He has been having hematuria for about a week or so.  This is a recurrent problem for him and he is followed by [facility] urology.  However this morning he was unable to urinate at all.  He came here after calling his urologist to get a Foley catheter placed.  Just prior to me seeing him after triage he was able to urinate and urinated fully.  He feels like there is no residual urine.  Every once in a while if he cannot immediately pee then he will feel a little bit of burning but there is no other dysuria.  Past Medical History:  Diagnosis Date   Anosmia    CAD (coronary artery disease), native coronary artery    a. 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 placed on colchicine    ED (erectile dysfunction)    Elevated TSH    Fatty liver    Gout    Ischemic cardiomyopathy    a. 2D ECHO 11/04/14 w/ EF 40-45% with LV WMA, G2DD, mild MR, mild LA dilation, mod TR.   PAF (paroxysmal atrial fibrillation) ([place])    a. Dx on 11/03/14 admission. Not placed on longterm AC due to DAPT w/ receent DES. Will plan for outpt heart monitor    Pre-diabetes    a. 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[place])    Ischemic cardiomyopathy    PAF post MI    CAD S/P percutaneous coronary angioplasty    ED (erectile dysfunction)    Gout    Pre-diabetes    Diarrhea 11/05/2014   Hyperlipidemia    Elevated liver  enzymes    ST elevation myocardial infarction (STEMI) involving other coronary artery of anterior wall - STEMI s/p DES to proximal LAD; residual 80 % proximal and 60% mid LCx diease     Past Surgical History:  Procedure Laterality Date   CORONARY ANGIOPLASTY WITH STENT PLACEMENT  10/30/14   STEMI s/p DES to proximal LAD; residual 80 % proximal and 60% mid LCx diease   LEFT HEART CATHETERIZATION WITH CORONARY ANGIOGRAM N/A 10/30/2014   Procedure: LEFT HEART CATHETERIZATION WITH CORONARY ANGIOGRAM;  Surgeon: Lorretta Harp, MD;  Location: [facility] CATH LAB;  Service: Cardiovascular;  Laterality: N/A;   PERCUTANEOUS CORONARY STENT INTERVENTION (PCI-S)  10/30/2014   Procedure: PERCUTANEOUS CORONARY STENT INTERVENTION (PCI-S);  Surgeon: Lorretta Harp, MD;  Location: [facility] CATH LAB;  Service: Cardiovascular;;   PROSTATECTOMY     TONSILLECTOMY         Family History  Problem Relation Age of Onset   Cancer Other    Coronary artery disease Brother 52       s/p CABG   Diabetes Other    Hyperlipidemia Other    Hypertension Other    Gout Other  Social History   Tobacco Use   Smoking status: Former    Types: Cigarettes    Quit date: 08/18/1992    Years since quitting: 28.6   Smokeless tobacco: Never  Substance Use Topics   Alcohol use: Yes    Comment: social   Drug use: No    Home Medications Prior to Admission medications   Medication Sig Start Date End Date Taking? Authorizing Provider  Ascorbic Acid (VITAMIN C) 500 MG tablet Take 500 mg by mouth daily.     Yes [provider]  atorvastatin (LIPITOR) 40 MG tablet TAKE 1 TABLET BY MOUTH EVERY DAY AT 6PM 12/31/20  Yes Lorretta Harp, MD  carvedilol (COREG) 3.125 MG tablet TAKE 1 TABLET BY MOUTH TWICE A DAY WITH MEALS 07/27/20  Yes Lorretta Harp, MD  clopidogrel (PLAVIX) 75 MG tablet TAKE 1 TABLET BY MOUTH EVERY DAY 07/28/18  Yes Lorretta Harp, MD  colchicine 0.6 MG tablet Take 1 tablet (0.6 mg total) by mouth  daily. Patient taking differently: Take 0.6 mg by mouth as needed. AS NEEDED FOR GOUT. 11/06/14  Yes Eileen Stanford, PA-C  nitroGLYCERIN (NITROSTAT) 0.4 MG SL tablet Place 1 tablet (0.4 mg total) under the tongue every 5 (five) minutes as needed for chest pain. 07/30/18  Yes Lorretta Harp, MD  pentosan polysulfate (ELMIRON) 100 MG capsule Take 100 mg by mouth 3 (three) times daily.   Yes [provider]  XARELTO 20 MG TABS tablet TAKE 1 TABLET BY MOUTH EVERY DAY WITH SUPPER 11/23/20  Yes Lorretta Harp, MD  Blood Pressure Monitor KIT For daily blood pressure monitoring;  Arm Cuff 06/05/15   Lorretta Harp, MD  clopidogrel (PLAVIX) 75 MG tablet TAKE 1 TABLET BY MOUTH ONCE DAILY . APPOINTMENT REQUIRED FOR FUTURE REFILLS (04/05/19  WITH  DR  Gwenlyn Found) Patient not taking: Reported on 04/11/2021 06/10/19   Lorretta Harp, MD  clopidogrel (PLAVIX) 75 MG tablet TAKE 1 TABLET BY MOUTH EVERY DAY Patient not taking: Reported on 04/11/2021 01/28/21   Lorretta Harp, MD    Allergies    Penicillins, Corn syrup [glucose], and Aspirin adult low [aspirin]  Review of Systems   Review of Systems  Gastrointestinal:  Positive for abdominal pain (resolved after urinating).  Genitourinary:  Positive for difficulty urinating, dysuria and hematuria.  Musculoskeletal:  Negative for back pain.  All other systems reviewed and are negative.  Physical Exam Updated Vital Signs BP (!) 105/54   Pulse (!) 59   Temp 98.8 F (37.1 C) (Oral)   Resp 18   SpO2 100%   Physical Exam Vitals and nursing note reviewed.  Constitutional:      Appearance: He is well-developed.  HENT:     Head: Normocephalic and atraumatic.     Right Ear: External ear normal.     Left Ear: External ear normal.     Nose: Nose normal.  Eyes:     General:        Right eye: No discharge.        Left eye: No discharge.  Cardiovascular:     Rate and Rhythm: Normal rate and regular rhythm.     Heart sounds: Normal heart  sounds.  Pulmonary:     Effort: Pulmonary effort is normal.     Breath sounds: Normal breath sounds.  Abdominal:     General: There is no distension.     Palpations: Abdomen is soft.     Tenderness: There  is no abdominal tenderness.  Genitourinary:    Penis: Normal.   Musculoskeletal:     Cervical back: Neck supple.  Skin:    General: Skin is warm and dry.  Neurological:     Mental Status: He is alert.  Psychiatric:        Mood and Affect: Mood is not anxious.    ED Results / Procedures / Treatments   Labs (all labs ordered are listed, but only abnormal results are displayed) Labs Reviewed  BASIC METABOLIC PANEL - Abnormal; Notable for the following components:      Result Value   CO2 21 (*)    Glucose, Bld 138 (*)    Calcium 8.7 (*)    All other components within normal limits  CBC WITH DIFFERENTIAL/PLATELET - Abnormal; Notable for the following components:   WBC 11.8 (*)    RBC 2.66 (*)    Hemoglobin 8.0 (*)    HCT 27.2 (*)    MCV 102.3 (*)    MCHC 29.4 (*)    Neutro Abs 9.1 (*)    All other components within normal limits  URINALYSIS, ROUTINE W REFLEX MICROSCOPIC    EKG None  Radiology No results found.  Procedures Procedures   Medications Ordered in ED Medications - No data to display  ED Course  I have reviewed the triage vital signs and the nursing notes.  Pertinent labs & imaging results that were available during my care of the patient were reviewed by me and considered in my medical decision making (see chart for details).    MDM Rules/Calculators/A&P                           Patient is well-appearing.  He has soft blood pressures but no hypotension and he is not acutely ill.  He is found to have significant anemia with a hemoglobin of 8.0, last hemoglobin a few months ago at his PCPs office was 14.  Seems to be that he is actually been bleeding for several weeks rather than just 1 week.  I discussed with [facility] u[facility], Dr. Darcus Austin, who has set up  an appointment for him on August and they will recheck.  The patient completely emptied his bladder and does not want to wait to give a urinalysis as he is not really having any issues besides the blood itself.  He does not feel he has a UTI.  I think this is pretty reasonable and we discussed return precautions and he otherwise appears stable for discharge. Final Clinical Impression(s) / ED Diagnoses Final diagnoses:  Gross hematuria  Blood loss anemia    Rx / DC Orders ED Discharge Orders     None        Sherwood Gambler, MD 04/11/21 1439

## 2021-04-12 DIAGNOSIS — N138 Other obstructive and reflux uropathy: Secondary | ICD-10-CM | POA: Diagnosis not present

## 2021-04-12 DIAGNOSIS — Z923 Personal history of irradiation: Secondary | ICD-10-CM | POA: Diagnosis not present

## 2021-04-12 DIAGNOSIS — Z87891 Personal history of nicotine dependence: Secondary | ICD-10-CM | POA: Diagnosis not present

## 2021-04-12 DIAGNOSIS — I255 Ischemic cardiomyopathy: Secondary | ICD-10-CM | POA: Diagnosis not present

## 2021-04-12 DIAGNOSIS — Z9861 Coronary angioplasty status: Secondary | ICD-10-CM | POA: Diagnosis not present

## 2021-04-12 DIAGNOSIS — Z9079 Acquired absence of other genital organ(s): Secondary | ICD-10-CM | POA: Diagnosis not present

## 2021-04-12 DIAGNOSIS — D649 Anemia, unspecified: Secondary | ICD-10-CM | POA: Diagnosis not present

## 2021-04-12 DIAGNOSIS — N3040 Irradiation cystitis without hematuria: Secondary | ICD-10-CM | POA: Diagnosis not present

## 2021-04-12 DIAGNOSIS — R918 Other nonspecific abnormal finding of lung field: Secondary | ICD-10-CM | POA: Diagnosis not present

## 2021-04-12 DIAGNOSIS — N3041 Irradiation cystitis with hematuria: Secondary | ICD-10-CM | POA: Diagnosis not present

## 2021-04-12 DIAGNOSIS — Z20822 Contact with and (suspected) exposure to covid-19: Secondary | ICD-10-CM | POA: Diagnosis not present

## 2021-04-12 DIAGNOSIS — I4891 Unspecified atrial fibrillation: Secondary | ICD-10-CM | POA: Diagnosis not present

## 2021-04-12 DIAGNOSIS — I2510 Atherosclerotic heart disease of native coronary artery without angina pectoris: Secondary | ICD-10-CM | POA: Diagnosis not present

## 2021-04-12 DIAGNOSIS — R310 Gross hematuria: Secondary | ICD-10-CM | POA: Diagnosis not present

## 2021-04-12 DIAGNOSIS — C61 Malignant neoplasm of prostate: Secondary | ICD-10-CM | POA: Diagnosis not present

## 2021-04-12 DIAGNOSIS — Z79899 Other long term (current) drug therapy: Secondary | ICD-10-CM | POA: Diagnosis not present

## 2021-04-12 DIAGNOSIS — D6489 Other specified anemias: Secondary | ICD-10-CM | POA: Diagnosis not present

## 2021-04-12 DIAGNOSIS — R030 Elevated blood-pressure reading, without diagnosis of hypertension: Secondary | ICD-10-CM | POA: Diagnosis not present

## 2021-04-12 DIAGNOSIS — R8290 Unspecified abnormal findings in urine: Secondary | ICD-10-CM | POA: Diagnosis not present

## 2021-04-12 DIAGNOSIS — D62 Acute posthemorrhagic anemia: Secondary | ICD-10-CM | POA: Diagnosis not present

## 2021-04-12 DIAGNOSIS — Z7901 Long term (current) use of anticoagulants: Secondary | ICD-10-CM | POA: Diagnosis not present

## 2021-04-13 DIAGNOSIS — N3040 Irradiation cystitis without hematuria: Secondary | ICD-10-CM | POA: Diagnosis not present

## 2021-04-13 DIAGNOSIS — D62 Acute posthemorrhagic anemia: Secondary | ICD-10-CM | POA: Diagnosis present

## 2021-04-13 DIAGNOSIS — Z87891 Personal history of nicotine dependence: Secondary | ICD-10-CM | POA: Diagnosis not present

## 2021-04-13 DIAGNOSIS — Z79899 Other long term (current) drug therapy: Secondary | ICD-10-CM | POA: Diagnosis not present

## 2021-04-13 DIAGNOSIS — D649 Anemia, unspecified: Secondary | ICD-10-CM | POA: Diagnosis present

## 2021-04-13 DIAGNOSIS — R310 Gross hematuria: Secondary | ICD-10-CM | POA: Diagnosis not present

## 2021-04-13 DIAGNOSIS — N3041 Irradiation cystitis with hematuria: Secondary | ICD-10-CM | POA: Diagnosis present

## 2021-04-13 DIAGNOSIS — Z923 Personal history of irradiation: Secondary | ICD-10-CM | POA: Diagnosis not present

## 2021-04-13 DIAGNOSIS — Z20822 Contact with and (suspected) exposure to covid-19: Secondary | ICD-10-CM | POA: Diagnosis present

## 2021-04-13 DIAGNOSIS — Z8546 Personal history of malignant neoplasm of prostate: Secondary | ICD-10-CM | POA: Diagnosis not present

## 2021-04-13 DIAGNOSIS — Z7902 Long term (current) use of antithrombotics/antiplatelets: Secondary | ICD-10-CM | POA: Diagnosis not present

## 2021-04-13 DIAGNOSIS — C61 Malignant neoplasm of prostate: Secondary | ICD-10-CM | POA: Diagnosis not present

## 2021-04-13 DIAGNOSIS — R338 Other retention of urine: Secondary | ICD-10-CM | POA: Diagnosis present

## 2021-04-13 DIAGNOSIS — Z7901 Long term (current) use of anticoagulants: Secondary | ICD-10-CM | POA: Diagnosis not present

## 2021-04-13 DIAGNOSIS — I252 Old myocardial infarction: Secondary | ICD-10-CM | POA: Diagnosis not present

## 2021-04-17 ENCOUNTER — Telehealth: Payer: Self-pay

## 2021-04-17 ENCOUNTER — Telehealth: Payer: Self-pay | Admitting: Cardiovascular Disease

## 2021-04-17 NOTE — Telephone Encounter (Signed)
Spoke to patient he stated he was recently discharged from [facility] with gross hematuria and urinary retention.He has been holding Xarelto for 5 days.He was told to see Dr.Berry soon.He needs to know if ok to restart Xarelto.Appointment scheduled with Coletta Memos NP 9/12 at 2:15 pm.Message sent to [facility] for advice.

## 2021-04-17 NOTE — Telephone Encounter (Signed)
Spoke to patient he stated he was discharged from [facility] yesterday.Stated he had gross hematuria and urinary retention.Stated Xarelto was stopped.He was told to see Dr.Berry soon for advice when to restart Xarelto.Appointment scheduled with Dr.Berry 9/2 at 11:30 am.He will bring copies of recent lab for Dr.Berry to review.

## 2021-04-17 NOTE — Telephone Encounter (Signed)
Pt c/o medication issue:  1. Name of Medication: XARELTO 20 MG TABS tablet  2. How are you currently taking this medication (dosage and times per day)? Patient last took the medication Thursday 04/11/21  3. Are you having a reaction (difficulty breathing--STAT)?   4. What is your medication issue? Patient had instructions on his AVS from his hospital stay at [facility] to hold xarelto until he spoke to Dr. Gwenlyn Found.  He had blood in his urine (Gross Hematuria ) and Blood Loss.

## 2021-04-19 ENCOUNTER — Ambulatory Visit: Payer: Medicare Other | Admitting: Cardiovascular Disease

## 2021-04-19 NOTE — Telephone Encounter (Signed)
Spoke to patient Dr.Berry's advice given.

## 2021-04-24 DIAGNOSIS — R319 Hematuria, unspecified: Secondary | ICD-10-CM | POA: Diagnosis not present

## 2021-04-24 DIAGNOSIS — C61 Malignant neoplasm of prostate: Secondary | ICD-10-CM | POA: Diagnosis not present

## 2021-04-24 DIAGNOSIS — R600 Localized edema: Secondary | ICD-10-CM | POA: Diagnosis not present

## 2021-04-24 DIAGNOSIS — D62 Acute posthemorrhagic anemia: Secondary | ICD-10-CM | POA: Diagnosis not present

## 2021-04-24 DIAGNOSIS — M109 Gout, unspecified: Secondary | ICD-10-CM | POA: Diagnosis not present

## 2021-04-24 DIAGNOSIS — I509 Heart failure, unspecified: Secondary | ICD-10-CM | POA: Diagnosis not present

## 2021-04-24 DIAGNOSIS — I4891 Unspecified atrial fibrillation: Secondary | ICD-10-CM | POA: Diagnosis not present

## 2021-04-24 DIAGNOSIS — D6489 Other specified anemias: Secondary | ICD-10-CM | POA: Diagnosis not present

## 2021-04-24 DIAGNOSIS — Z7901 Long term (current) use of anticoagulants: Secondary | ICD-10-CM | POA: Diagnosis not present

## 2021-04-24 DIAGNOSIS — E8809 Other disorders of plasma-protein metabolism, not elsewhere classified: Secondary | ICD-10-CM | POA: Diagnosis not present

## 2021-04-26 NOTE — Progress Notes (Signed)
Cardiology Clinic Note   Patient Name: Pedro Pope. Date of Encounter: 04/29/2021  Primary Care Provider:  Shon Baton, MD Primary Cardiologist:  Quay Burow, MD  Patient Profile    Pedro Pope. 80 year old male presents the clinic today for follow-up evaluation of his coronary artery disease and mixed hyperlipidemia.  Past Medical History    Past Medical History:  Diagnosis Date   Anosmia    CAD (coronary artery disease), native coronary artery    a. 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 placed on colchicine    ED (erectile dysfunction)    Elevated TSH    Fatty liver    Gout    Ischemic cardiomyopathy    a. 2D ECHO 11/04/14 w/ EF 40-45% with LV WMA, G2DD, mild MR, mild LA dilation, mod TR.   PAF (paroxysmal atrial fibrillation) ([place])    a. Dx on 11/03/14 admission. Not placed on longterm AC due to DAPT w/ receent DES. Will plan for outpt heart monitor    Pre-diabetes    a. HgA1c 6.1 in 10/2014   Prostate cancer [facility])    s/p prostatectomy and XRT   Rheumatic fever 1947   Past Surgical History:  Procedure Laterality Date   CORONARY ANGIOPLASTY WITH STENT PLACEMENT  10/30/14   STEMI s/p DES to proximal LAD; residual 80 % proximal and 60% mid LCx diease   LEFT HEART CATHETERIZATION WITH CORONARY ANGIOGRAM N/A 10/30/2014   Procedure: LEFT HEART CATHETERIZATION WITH CORONARY ANGIOGRAM;  Surgeon: Lorretta Harp, MD;  Location: [facility] CATH LAB;  Service: Cardiovascular;  Laterality: N/A;   PERCUTANEOUS CORONARY STENT INTERVENTION (PCI-S)  10/30/2014   Procedure: PERCUTANEOUS CORONARY STENT INTERVENTION (PCI-S);  Surgeon: Lorretta Harp, MD;  Location: [facility] CATH LAB;  Service: Cardiovascular;;   PROSTATECTOMY     TONSILLECTOMY      Allergies  Allergies  Allergen Reactions   Penicillins Anaphylaxis   Corn Syrup [Glucose] Other (See Comments)    High fructose  corn syrup causes gout   Aspirin Adult Low [Aspirin] Itching and Rash    Dr. [name] suspects that long term caused a rash, short term use appears to be ok    History of Present Illness    Pedro Pope. is a PMH of coronary artery disease status post STEMI.  He had an anterior lateral infarction and underwent cardiac catheterization with PCI and DES by Dr. Gwenlyn Found on 10/30/2014.  His stent was placed in his proximal LAD.  His EF at that time was 40-45% and he was noted to have moderate anteroapical hypokinesis.  He was discharged on 11/01/2014 and readmitted with atrial fibrillation with RVR several days later.  He was symptomatic at that time.  His EKG showed changes and he was converted to NSR.  Oral anticoagulation was considered however, his bleeding risks were felt to outweigh his benefits.  He wore a cardiac event monitor which showed several episodes of brief paroxysmal atrial fibrillation.  His beta-blocker was discontinued due to bradycardia and hypotension.  His PMH also includes mixed hyperlipidemia, anemia, hematuria, and ischemic cardiomyopathy.  Has follow-up nuclear stress test showed dense scar in the LAD territory and his echocardiogram showed an EF of 30-35%.  He underwent cardiac rehab and was asymptomatic.  He underwent repeat echocardiogram 9/16 which showed an improved LV function of 40-45%.  Dr. Gwenlyn Found referred to  Dr. Rayann Heman for consideration of ICD placement.  His atrial fibrillation was also discussed.  He was placed on Xarelto for anticoagulation and his Brilinta was transitioned to Plavix to lower his bleeding risk.  He was seen by Dr. Gwenlyn Found 04/30/2020.  During that time he was doing well.  He noted 1 episode of chest pain 5 months prior.  His discomfort was relieved by 2 sublingual nitroglycerin.  At the time of the visit he denied chest pain shortness of breath as well as claudication.  He reported having a CT scan at [facility] which showed left iliac occlusion.  Contacted nurse  triage line on 04/17/2021 and reported that he had been discharged from [facility] after a bout of gross hematuria and urinary retention.  He reported that his Xarelto has been stopped.  He was contacting cardiology office/Dr. Gwenlyn Found for further recommendations.  He presents the clinic today for follow-up evaluation states he feels well today.  He reports that he had another recurrence of blood in his urine after taking a dose of colchicine for his gout last weekend.  We reviewed his CHA2DS2-VASc score and risk.  Will not restart his Xarelto at this time.  He denies episodes of chest discomfort.  He reports that since stopping Xarelto he has not had any chest discomfort whatsoever.  He has increased the protein in his diet and has noticed less swelling in his ankles.  He shows me labs from his most recent admission at [facility] where his albumin was low.  We reviewed the importance of maintaining his protein sources in his diet.  I also discussed the importance of heart healthy low-sodium high-fiber diet.  He and his wife expressed understanding.  I will have him keep his follow-up appointment as scheduled with Dr. Gwenlyn Found.  On  Today he denies chest pain, shortness of breath, lower extremity edema, fatigue, palpitations, melena, hematuria, hemoptysis, diaphoresis, weakness, presyncope, syncope, orthopnea, and PND.   Home Medications    Prior to Admission medications   Medication Sig Start Date End Date Taking? Authorizing Provider  Ascorbic Acid (VITAMIN C) 500 MG tablet Take 500 mg by mouth daily.      [provider]  atorvastatin (LIPITOR) 40 MG tablet TAKE 1 TABLET BY MOUTH EVERY DAY AT 6PM 12/31/20   Lorretta Harp, MD  Blood Pressure Monitor KIT For daily blood pressure monitoring;  Arm Cuff 06/05/15   Lorretta Harp, MD  carvedilol (COREG) 3.125 MG tablet TAKE 1 TABLET BY MOUTH TWICE A DAY WITH MEALS 07/27/20   Lorretta Harp, MD  clopidogrel (PLAVIX) 75 MG tablet TAKE 1 TABLET BY MOUTH  EVERY DAY 07/28/18   Lorretta Harp, MD  clopidogrel (PLAVIX) 75 MG tablet TAKE 1 TABLET BY MOUTH ONCE DAILY . APPOINTMENT REQUIRED FOR FUTURE REFILLS (04/05/19  WITH  DR  Gwenlyn Found) Patient not taking: Reported on 04/11/2021 06/10/19   Lorretta Harp, MD  clopidogrel (PLAVIX) 75 MG tablet TAKE 1 TABLET BY MOUTH EVERY DAY Patient not taking: Reported on 04/11/2021 01/28/21   Lorretta Harp, MD  colchicine 0.6 MG tablet Take 1 tablet (0.6 mg total) by mouth daily. Patient taking differently: Take 0.6 mg by mouth as needed. AS NEEDED FOR GOUT. 11/06/14   Eileen Stanford, PA-C  nitroGLYCERIN (NITROSTAT) 0.4 MG SL tablet Place 1 tablet (0.4 mg total) under the tongue every 5 (five) minutes as needed for chest pain. 07/30/18   Lorretta Harp, MD  pentosan polysulfate (ELMIRON) 100 MG capsule Take  100 mg by mouth 3 (three) times daily.    [provider]  XARELTO 20 MG TABS tablet TAKE 1 TABLET BY MOUTH EVERY DAY WITH SUPPER 11/23/20   Lorretta Harp, MD    Family History    Family History  Problem Relation Age of Onset   Cancer Other    Coronary artery disease Brother 55       s/p CABG   Diabetes Other    Hyperlipidemia Other    Hypertension Other    Gout Other    He indicated that his mother is deceased. He indicated that his father is deceased. He indicated that the status of his brother is unknown. He indicated that the status of his other is unknown.  Social History    Social History   Socioeconomic History   Marital status: Married    Spouse name: Not on file   Number of children: Not on file   Years of education: Not on file   Highest education level: Not on file  Occupational History   Not on file  Tobacco Use   Smoking status: Former    Types: Cigarettes    Quit date: 08/18/1992    Years since quitting: 28.7   Smokeless tobacco: Never  Substance and Sexual Activity   Alcohol use: Yes    Comment: social   Drug use: No   Sexual activity: Not on file   Other Topics Concern   Not on file  Social History Narrative   Pt lives in [place] with spouse.  Realtor.  Retired Surveyor, quantity of Radio broadcast assistant Strain: Not on [employer] Insecurity: Not on file  Transportation Needs: Not on file  Physical Activity: Not on file  Stress: Not on file  Social Connections: Not on file  Intimate Partner Violence: Not on file     Review of Systems    General:  No chills, fever, night sweats or weight changes.  Cardiovascular:  No chest pain, dyspnea on exertion, edema, orthopnea, palpitations, paroxysmal nocturnal dyspnea. Dermatological: No rash, lesions/masses Respiratory: No cough, dyspnea Urologic: No hematuria, dysuria Abdominal:   No nausea, vomiting, diarrhea, bright red blood per rectum, melena, or hematemesis Neurologic:  No visual changes, wkns, changes in mental status. All other systems reviewed and are otherwise negative except as noted above.  Physical Exam    VS:  BP (!) 120/50 (BP Location: Left Arm)   Pulse (!) 53   Ht 5' 8.5" (1.74 m)   Wt 143 lb 6.4 oz (65 kg)   SpO2 98%   BMI 21.49 kg/m  , BMI Body mass index is 21.49 kg/m. GEN: Well nourished, well developed, in no acute distress. HEENT: normal. Neck: Supple, no JVD, carotid bruits, or masses. Cardiac: RRR, no murmurs, rubs, or gallops. No clubbing, cyanosis, edema.  Radials/DP/PT 2+ and equal bilaterally.  Respiratory:  Respirations regular and unlabored, clear to auscultation bilaterally. GI: Soft, nontender, nondistended, BS + x 4. MS: no deformity or atrophy. Skin: warm and dry, no rash. Neuro:  Strength and sensation are intact. Psych: Normal affect.  Accessory Clinical Findings    Recent Labs: 04/11/2021: BUN 18; Creatinine, Ser 1.11; Hemoglobin 8.0; Platelets 332; Potassium 5.0; Sodium 135   Recent Lipid Panel    Component Value Date/Time   CHOL 74 01/16/2015 0927   TRIG 99 01/16/2015 0927   HDL 31 (L) 01/16/2015  0927   CHOLHDL 2.4 01/16/2015 0927   VLDL 20 01/16/2015 3419  [place] 23 01/16/2015 0927    ECG personally reviewed by me today-sinus bradycardia with first-degree AV block anterior lateral ischemia 53 bpm- No acute changes  Echocardiogram 04/19/2015  Study Conclusions   - Left ventricle: The cavity size was normal. Systolic function was    mildly to moderately reduced. The estimated ejection fraction was    in the range of 40% to 45%. There is akinesis of the mid    inferoseptal and apical septal myocardium. There is akinesis of    the entireapical myocardium. There is akinesis of the basal-mid    anterolateral and apical anterior myocardium.  - Aortic valve: Trileaflet; mildly thickened, mildly calcified    leaflets. There was trivial regurgitation.  - Mitral valve: Calcified annulus. There was mild regurgitation.  - Right atrium: The atrium was moderately dilated.   Assessment & Plan   1.  Coronary artery disease-denies chest pain today.  Underwent cardiac catheterization 10/30/2014 and received PCI with DES x1 to his proximal LAD.  Follow-up nuclear stress test showed LAD scaring.  Most recent echocardiogram showed LVEF 40-45%. Continue atorvastatin, carvedilol, clopidogrel Heart healthy low-sodium diet-salty 6 given Increase physical activity as tolerated  Hyperlipidemia-LDL 38 on 10/08/20 Continue atorvastatin Heart healthy low-sodium high-fiber diet Increase physical activity as tolerated   Paroxysmal atrial fibrillation-denies recent episodes of irregular or accelerated heartbeat.  Diagnosed with A. fib with RVR post cardiac catheterization 3/16.  Wore cardiac event monitor which showed brief episodes of paroxysmal atrial fibrillation.  Recently discharged from [facility] after a bout of gross hematuria and urinary retention.  His Xarelto was stopped at that time.CHA2DS2-VASc Score = 4   This indicates a 4.8% annual risk of stroke. The patient's score is based upon: CHF History:  1 HTN History: 0 Diabetes History: 0 Stroke History: 0 Vascular Disease History: 1 Age Score: 2 Gender Score: 0  Risk calculator algorithm discussed with patient.  He expresses understanding of risk.  Due to recent hematuria and increased bleeding risk we will not restart Xarelto at this time.  Ischemic cardiomyopathy-NYHA  class II today.  Ankle edema.  Echocardiogram 04/19/2015 showed LVEF 40-45%. Continue carvedilol Heart healthy low-sodium diet-salty 6 given Increase physical activity as tolerated  Disposition: Follow-up with Dr. Gwenlyn Found as scheduled.  Jossie Ng. Nik Gorrell NP-C    04/29/2021, 2:51 PM [facility] [place] [address] Office [phone] Fax [phone]  Notice: This dictation was prepared with Dragon dictation along with smaller phrase technology. Any transcriptional errors that result from this process are unintentional and may not be corrected upon review.  I spent 14 minutes examining this patient, reviewing medications, and using patient centered shared decision making involving her cardiac care.  Prior to her visit I spent greater than 20 minutes reviewing her past medical history,  medications, and prior cardiac tests.

## 2021-04-29 ENCOUNTER — Ambulatory Visit (INDEPENDENT_AMBULATORY_CARE_PROVIDER_SITE_OTHER): Payer: Medicare Other | Admitting: General Practice

## 2021-04-29 ENCOUNTER — Encounter: Payer: Self-pay | Admitting: General Practice

## 2021-04-29 ENCOUNTER — Other Ambulatory Visit: Payer: Self-pay

## 2021-04-29 VITALS — BP 120/50 | HR 53 | Ht 68.5 in | Wt 143.4 lb

## 2021-04-29 DIAGNOSIS — I480 Paroxysmal atrial fibrillation: Secondary | ICD-10-CM | POA: Diagnosis not present

## 2021-04-29 DIAGNOSIS — E782 Mixed hyperlipidemia: Secondary | ICD-10-CM

## 2021-04-29 DIAGNOSIS — I2510 Atherosclerotic heart disease of native coronary artery without angina pectoris: Secondary | ICD-10-CM

## 2021-04-29 DIAGNOSIS — I255 Ischemic cardiomyopathy: Secondary | ICD-10-CM

## 2021-04-29 NOTE — Patient Instructions (Signed)
Medication Instructions:  STOP XARELTO  *If you need a refill on your cardiac medications before your next appointment, please call your pharmacy*  Lab Work:   Testing/Procedures:  NONE    NONE  Special Instructions PLEASE READ AND FOLLOW INCREASED FIBER DIET-ATTACHED  PLEASE SEE ETI COMPRESSION STOCKING FLYER GIVEN TO YOU FOR ORDERING DIRECTIONS-CALL AND ORDER COMPRESSION=15-20 mmHg KNEE HIGHS. S  Follow-Up: Your next appointment:  KEEP SCHEDULED October APPOINTMENT In Person with Quay Burow, MD   At [facility], you and your health needs are our priority.  As part of our continuing mission to provide you with exceptional heart care, we have created designated Provider Care Teams.  These Care Teams include your primary Cardiologist (physician) and Advanced Practice Providers (APPs -  Physician Assistants and Nurse Practitioners) who all work together to provide you with the care you need, when you need it.          High-Fiber Eating Plan Fiber, also called dietary fiber, is a type of carbohydrate. It is found foods such as fruits, vegetables, whole grains, and beans. A high-fiber diet can have many health benefits. Your health care provider may recommend a high-fiber diet to help: Prevent constipation. Fiber can make your bowel movements more regular. Lower your cholesterol. Relieve the following conditions: Inflammation of veins in the anus (hemorrhoids). Inflammation of specific areas of the digestive tract (uncomplicated diverticulosis). A problem of the large intestine, also called the colon, that sometimes causes pain and diarrhea (irritable bowel syndrome, or IBS). Prevent overeating as part of a weight-loss plan. Prevent heart disease, type 2 diabetes, and certain cancers. What are tips for following this plan? Reading food labels  Check the nutrition facts label on food products for the amount of dietary fiber. Choose foods that have 5 grams of fiber or more per  serving. The goals for recommended daily fiber intake include: Men (age 25 or younger): 34-38 g. Men (over age 70): 28-34 g. Women (age 67 or younger): 25-28 g. Women (over age 66): 22-25 g. Your daily fiber goal is _____________ g. Shopping Choose whole fruits and vegetables instead of processed forms, such as apple juice or applesauce. Choose a wide variety of high-fiber foods such as avocados, lentils, oats, and kidney beans. Read the nutrition facts label of the foods you choose. Be aware of foods with added fiber. These foods often have high sugar and sodium amounts per serving. Cooking Use whole-grain flour for baking and cooking. Cook with brown rice instead of white rice. Meal planning Start the day with a breakfast that is high in fiber, such as a cereal that contains 5 g of fiber or more per serving. Eat breads and cereals that are made with whole-grain flour instead of refined flour or white flour. Eat brown rice, bulgur wheat, or millet instead of white rice. Use beans in place of meat in soups, salads, and pasta dishes. Be sure that half of the grains you eat each day are whole grains. General information You can get the recommended daily intake of dietary fiber by: Eating a variety of fruits, vegetables, grains, nuts, and beans. Taking a fiber supplement if you are not able to take in enough fiber in your diet. It is better to get fiber through food than from a supplement. Gradually increase how much fiber you consume. If you increase your intake of dietary fiber too quickly, you may have bloating, cramping, or gas. Drink plenty of water to help you digest fiber. Choose high-fiber snacks,  such as berries, raw vegetables, nuts, and popcorn. What foods should I eat? Fruits Berries. Pears. Apples. Oranges. Avocado. Prunes and raisins. Dried figs. Vegetables Sweet potatoes. Spinach. Kale. Artichokes. Cabbage. Broccoli. Cauliflower. Green peas. Carrots.  Squash. Grains Whole-grain breads. Multigrain cereal. Oats and oatmeal. Brown rice. Barley. Bulgur wheat. [place]. Quinoa. Bran muffins. Popcorn. Rye wafer crackers. Meats and other proteins Navy beans, kidney beans, and pinto beans. Soybeans. Split peas. Lentils. Nuts and seeds. Dairy Fiber-fortified yogurt. Beverages Fiber-fortified soy milk. Fiber-fortified orange juice. Other foods Fiber bars. The items listed above may not be a complete list of recommended foods and beverages. Contact a dietitian for more information. What foods should I avoid? Fruits Fruit juice. Cooked, strained fruit. Vegetables Fried potatoes. Canned vegetables. Well-cooked vegetables. Grains White bread. Pasta made with refined flour. White rice. Meats and other proteins Fatty cuts of meat. Fried chicken or fried fish. Dairy Milk. Yogurt. Cream cheese. Sour cream. Fats and oils Butters. Beverages Soft drinks. Other foods Cakes and pastries. The items listed above may not be a complete list of foods and beverages to avoid. Talk with your dietitian about what choices are best for you. Summary Fiber is a type of carbohydrate. It is found in foods such as fruits, vegetables, whole grains, and beans. A high-fiber diet has many benefits. It can help to prevent constipation, lower blood cholesterol, aid weight loss, and reduce your risk of heart disease, diabetes, and certain cancers. Increase your intake of fiber gradually. Increasing fiber too quickly may cause cramping, bloating, and gas. Drink plenty of water while you increase the amount of fiber you consume. The best sources of fiber include whole fruits and vegetables, whole grains, nuts, seeds, and beans. This information is not intended to replace advice given to you by your health care provider. Make sure you discuss any questions you have with your health care provider. Document Revised: 12/08/2019 Document Reviewed: 12/08/2019 Elsevier Patient  Education  2022 [employer].

## 2021-05-11 ENCOUNTER — Other Ambulatory Visit: Payer: Self-pay | Admitting: Cardiovascular Disease

## 2021-06-02 ENCOUNTER — Other Ambulatory Visit: Payer: Self-pay | Admitting: Cardiovascular Disease

## 2021-06-03 DIAGNOSIS — Z23 Encounter for immunization: Secondary | ICD-10-CM | POA: Diagnosis not present

## 2021-06-12 ENCOUNTER — Ambulatory Visit (INDEPENDENT_AMBULATORY_CARE_PROVIDER_SITE_OTHER): Payer: Medicare Other | Admitting: Cardiovascular Disease

## 2021-06-12 ENCOUNTER — Other Ambulatory Visit: Payer: Self-pay

## 2021-06-12 ENCOUNTER — Encounter: Payer: Self-pay | Admitting: Cardiovascular Disease

## 2021-06-12 DIAGNOSIS — I480 Paroxysmal atrial fibrillation: Secondary | ICD-10-CM

## 2021-06-12 DIAGNOSIS — E782 Mixed hyperlipidemia: Secondary | ICD-10-CM

## 2021-06-12 DIAGNOSIS — I255 Ischemic cardiomyopathy: Secondary | ICD-10-CM

## 2021-06-12 DIAGNOSIS — I2109 ST elevation (STEMI) myocardial infarction involving other coronary artery of anterior wall: Secondary | ICD-10-CM

## 2021-06-12 NOTE — Progress Notes (Signed)
06/12/2021 [name].   [birth year]  [id number]  Primary Physician Shon Baton, MD Primary Cardiologist: Lorretta Harp MD [name], [place]  HPI:  Elkin Belfield. is a 80 y.o.  thin appearing very Caucasian male father of 77, grandfather 7 grandchildren and great grandfather of 67 great grandchildren . He  is a patient of [name]'s. I last saw him in the office 04/30/2020. 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 He basically had no risk factors prior to his presentation. He had an acute anterolateral infarction. By direct intervention by myself 10/30/14 with a drug-eluting stent in his proximal LAD. He had a left dominant system and had moderate proximal AV groove circumflex disease with high-grade nondominant RCA disease. Ejection fraction was in the 40-45% range with moderate anteroapical hypokinesia. He was discharged home 2 days after admission and was admitted several days later with A. Fib with RVR which she was symptomatic. His EKG showed changes and he has converted to sinus rhythm. Consideration was given to oral anticoagulation over the risks of bleeding were thought to outweigh the benefits. He is wearing an event monitor and has noticed several episodes of brief PAF. His beta blocker was discontinued because of bradycardia and hypotension. Recent Myoview showed dense scar in the LAD territory and 2-D echo revealed an EF of 30-35%. He participated  in cardiac rehabilitation and is relatively asymptomatic. His most recent 2-D echo performed 04/19/15 showed improvement in his LV function up to the 40-45% range. I did send him to see Dr. Rayann Heman for consideration of ICD therapy and to discuss his A. Fib. He was ultimately placed on Xarelto oral anti-coagulation and his Luna Kitchens was transitioned to Plavix to mitigate his bleeding risk.   Since I saw him a year ago he continues to do well.  He apparently was seen at [facility] for gross hematuria  and his Xarelto was discontinued.  This has resolved.  He otherwise denies chest pain or shortness of breath.   Current Meds  Medication Sig   Ascorbic Acid (VITAMIN C) 500 MG tablet Take 500 mg by mouth daily.     atorvastatin (LIPITOR) 40 MG tablet TAKE 1 TABLET BY MOUTH EVERY DAY AT 6PM   Blood Pressure Monitor KIT For daily blood pressure monitoring;  Arm Cuff   carvedilol (COREG) 3.125 MG tablet TAKE 1 TABLET BY MOUTH TWICE A DAY WITH MEALS   Cholecalciferol 25 MCG (1000 UT) tablet Take by mouth daily.   clopidogrel (PLAVIX) 75 MG tablet TAKE 1 TABLET BY MOUTH EVERY DAY   pentosan polysulfate (ELMIRON) 100 MG capsule Take 100 mg by mouth 3 (three) times daily.     Allergies  Allergen Reactions   Penicillins Anaphylaxis   Corn Syrup [Glucose] Other (See Comments)    High fructose corn syrup causes gout   Aspirin Adult Low [Aspirin] Itching and Rash    Dr. [name] suspects that long term caused a rash, short term use appears to be ok    Social History   Socioeconomic History   Marital status: Married    Spouse name: Not on file   Number of children: Not on file   Years of education: Not on file   Highest education level: Not on file  Occupational History   Not on file  Tobacco Use   Smoking status: Former    Types: Cigarettes    Quit date: 08/18/1992    Years since  quitting: 28.8   Smokeless tobacco: Never  Substance and Sexual Activity   Alcohol use: Yes    Comment: social   Drug use: No   Sexual activity: Not on file  Other Topics Concern   Not on file  Social History Narrative   Pt lives in [place] with spouse.  Realtor.  Retired Surveyor, quantity of Radio broadcast assistant Strain: Not on [employer] Insecurity: Not on file  Transportation Needs: Not on file  Physical Activity: Not on file  Stress: Not on file  Social Connections: Not on file  Intimate Partner Violence: Not on file     Review of Systems: General: negative for  chills, fever, night sweats or weight changes.  Cardiovascular: negative for chest pain, dyspnea on exertion, edema, orthopnea, palpitations, paroxysmal nocturnal dyspnea or shortness of breath Dermatological: negative for rash Respiratory: negative for cough or wheezing Urologic: negative for hematuria Abdominal: negative for nausea, vomiting, diarrhea, bright red blood per rectum, melena, or hematemesis Neurologic: negative for visual changes, syncope, or dizziness All other systems reviewed and are otherwise negative except as noted above.    Blood pressure (!) 130/54, pulse (!) 54, height 5' 8.5" (1.74 m), weight 144 lb 6.4 oz (65.5 kg).  General appearance: alert and no distress Neck: no adenopathy, no carotid bruit, no JVD, supple, symmetrical, trachea midline, and thyroid not enlarged, symmetric, no tenderness/mass/nodules Lungs: clear to auscultation bilaterally Heart: regular rate and rhythm, S1, S2 normal, no murmur, click, rub or gallop Extremities: extremities normal, atraumatic, no cyanosis or edema Pulses: 2+ and symmetric Skin: Skin color, texture, turgor normal. No rashes or lesions Neurologic: Grossly normal  EKG not performed today  ASSESSMENT AND PLAN:   ST elevation myocardial infarction (STEMI) involving other coronary artery of anterior wall - STEMI s/p DES to proximal LAD; residual 80 % proximal and 60% mid LCx diease  History of CAD status post acute anterolateral myocardial infarction 10/30/2014 treated with drug-eluting stenting of the proximal LAD by myself.  He had a left dominant system and had moderate proximal AV groove circumflex disease with high-grade nondominant RCA disease.  His EF at that time was 40 to 45%.  His last echo performed 04/19/2015 showed an EF of 40 to 45% which has remained stable.  He is asymptomatic.  Hyperlipidemia History of hyperlipidemia on statin therapy with lipid profile performed 10/08/2020 revealing total cholesterol of 86, LDL 38  and HDL of 38.  PAF post MI History of PAF post MI on Xarelto oral anticoagulation which was recently discontinued at [facility] when he presented with gross hematuria.     Lorretta Harp MD FACP,FACC,FAHA, [facility] 06/12/2021 12:41 PM

## 2021-06-12 NOTE — Assessment & Plan Note (Signed)
History of hyperlipidemia on statin therapy with lipid profile performed 10/08/2020 revealing total cholesterol of 86, LDL 38 and HDL of 38.

## 2021-06-12 NOTE — Assessment & Plan Note (Signed)
History of PAF post MI on Xarelto oral anticoagulation which was recently discontinued at [facility] when he presented with gross hematuria.

## 2021-06-12 NOTE — Assessment & Plan Note (Signed)
History of CAD status post acute anterolateral myocardial infarction 10/30/2014 treated with drug-eluting stenting of the proximal LAD by myself.  He had a left dominant system and had moderate proximal AV groove circumflex disease with high-grade nondominant RCA disease.  His EF at that time was 40 to 45%.  His last echo performed 04/19/2015 showed an EF of 40 to 45% which has remained stable.  He is asymptomatic.

## 2021-06-12 NOTE — Patient Instructions (Signed)

## 2021-06-28 MED ORDER — NITROGLYCERIN 0.4 MG SL SUBL: 0.4000 mg | SUBLINGUAL_TABLET | SUBLINGUAL | 2 refills | Status: DC | PRN

## 2021-07-03 ENCOUNTER — Other Ambulatory Visit: Payer: Self-pay | Admitting: Cardiovascular Disease

## 2021-08-16 DIAGNOSIS — I2510 Atherosclerotic heart disease of native coronary artery without angina pectoris: Secondary | ICD-10-CM | POA: Diagnosis not present

## 2021-08-16 DIAGNOSIS — M1990 Unspecified osteoarthritis, unspecified site: Secondary | ICD-10-CM | POA: Diagnosis not present

## 2021-08-16 DIAGNOSIS — E781 Pure hyperglyceridemia: Secondary | ICD-10-CM | POA: Diagnosis not present

## 2021-10-22 DIAGNOSIS — R972 Elevated prostate specific antigen [PSA]: Secondary | ICD-10-CM | POA: Diagnosis not present

## 2021-10-22 DIAGNOSIS — N3040 Irradiation cystitis without hematuria: Secondary | ICD-10-CM | POA: Diagnosis not present

## 2021-10-22 DIAGNOSIS — C61 Malignant neoplasm of prostate: Secondary | ICD-10-CM | POA: Diagnosis not present

## 2021-10-22 DIAGNOSIS — N5231 Erectile dysfunction following radical prostatectomy: Secondary | ICD-10-CM | POA: Diagnosis not present

## 2021-10-30 DIAGNOSIS — I2510 Atherosclerotic heart disease of native coronary artery without angina pectoris: Secondary | ICD-10-CM | POA: Diagnosis not present

## 2021-10-30 DIAGNOSIS — E781 Pure hyperglyceridemia: Secondary | ICD-10-CM | POA: Diagnosis not present

## 2021-10-30 DIAGNOSIS — Z0000 Encounter for general adult medical examination without abnormal findings: Secondary | ICD-10-CM | POA: Diagnosis not present

## 2021-10-30 DIAGNOSIS — Z7901 Long term (current) use of anticoagulants: Secondary | ICD-10-CM | POA: Diagnosis not present

## 2021-10-30 DIAGNOSIS — I700 Atherosclerosis of aorta: Secondary | ICD-10-CM | POA: Diagnosis not present

## 2021-10-30 DIAGNOSIS — D6489 Other specified anemias: Secondary | ICD-10-CM | POA: Diagnosis not present

## 2021-10-30 DIAGNOSIS — K760 Fatty (change of) liver, not elsewhere classified: Secondary | ICD-10-CM | POA: Diagnosis not present

## 2021-10-30 DIAGNOSIS — R946 Abnormal results of thyroid function studies: Secondary | ICD-10-CM | POA: Diagnosis not present

## 2021-10-30 DIAGNOSIS — R739 Hyperglycemia, unspecified: Secondary | ICD-10-CM | POA: Diagnosis not present

## 2021-10-30 DIAGNOSIS — I4891 Unspecified atrial fibrillation: Secondary | ICD-10-CM | POA: Diagnosis not present

## 2021-10-30 DIAGNOSIS — E8809 Other disorders of plasma-protein metabolism, not elsewhere classified: Secondary | ICD-10-CM | POA: Diagnosis not present

## 2021-10-30 DIAGNOSIS — M109 Gout, unspecified: Secondary | ICD-10-CM | POA: Diagnosis not present

## 2021-12-08 ENCOUNTER — Encounter (HOSPITAL_COMMUNITY): Payer: Self-pay | Admitting: Emergency Medicine

## 2021-12-08 ENCOUNTER — Emergency Department (HOSPITAL_COMMUNITY)
Admission: EM | Admit: 2021-12-08 | Discharge: 2021-12-08 | Disposition: A | Discharge status: Home / Self Care | Payer: Medicare Other | Attending: Emergency Medicine | Admitting: Emergency Medicine

## 2021-12-08 ENCOUNTER — Emergency Department (HOSPITAL_COMMUNITY): Payer: Medicare Other

## 2021-12-08 ENCOUNTER — Other Ambulatory Visit: Payer: Self-pay

## 2021-12-08 DIAGNOSIS — Z7902 Long term (current) use of antithrombotics/antiplatelets: Secondary | ICD-10-CM | POA: Diagnosis not present

## 2021-12-08 DIAGNOSIS — Z79899 Other long term (current) drug therapy: Secondary | ICD-10-CM | POA: Insufficient documentation

## 2021-12-08 DIAGNOSIS — I2109 ST elevation (STEMI) myocardial infarction involving other coronary artery of anterior wall: Secondary | ICD-10-CM | POA: Diagnosis not present

## 2021-12-08 DIAGNOSIS — J181 Lobar pneumonia, unspecified organism: Secondary | ICD-10-CM | POA: Insufficient documentation

## 2021-12-08 DIAGNOSIS — R059 Cough, unspecified: Secondary | ICD-10-CM | POA: Diagnosis present

## 2021-12-08 DIAGNOSIS — R079 Chest pain, unspecified: Secondary | ICD-10-CM | POA: Diagnosis not present

## 2021-12-08 DIAGNOSIS — R0602 Shortness of breath: Secondary | ICD-10-CM | POA: Insufficient documentation

## 2021-12-08 DIAGNOSIS — R0789 Other chest pain: Secondary | ICD-10-CM | POA: Diagnosis not present

## 2021-12-08 DIAGNOSIS — J189 Pneumonia, unspecified organism: Secondary | ICD-10-CM

## 2021-12-08 LAB — CK: Total CK: 64 U/L (ref 49–397)

## 2021-12-08 LAB — CBC
HCT: 43.3 % (ref 39.0–52.0)
Hemoglobin: 13.9 g/dL (ref 13.0–17.0)
MCH: 29.9 pg (ref 26.0–34.0)
MCHC: 32.1 g/dL (ref 30.0–36.0)
MCV: 93.1 fL (ref 80.0–100.0)
Platelets: 212 10*3/uL (ref 150–400)
RBC: 4.65 MIL/uL (ref 4.22–5.81)
RDW: 13.2 % (ref 11.5–15.5)
WBC: 8.9 10*3/uL (ref 4.0–10.5)
nRBC: 0 % (ref 0.0–0.2)

## 2021-12-08 LAB — COMPREHENSIVE METABOLIC PANEL
ALT: 14 U/L (ref 0–44)
AST: 22 U/L (ref 15–41)
Albumin: 3.4 g/dL — ABNORMAL LOW (ref 3.5–5.0)
Alkaline Phosphatase: 75 U/L (ref 38–126)
Anion gap: 6 (ref 5–15)
BUN: 13 mg/dL (ref 8–23)
CO2: 25 mmol/L (ref 22–32)
Calcium: 8.7 mg/dL — ABNORMAL LOW (ref 8.9–10.3)
Chloride: 108 mmol/L (ref 98–111)
Creatinine, Ser: 0.92 mg/dL (ref 0.61–1.24)
GFR, Estimated: >60 mL/min (ref >60)
Glucose, Bld: 91 mg/dL (ref 70–99)
Potassium: 4.1 mmol/L (ref 3.5–5.1)
Sodium: 139 mmol/L (ref 135–145)
Total Bilirubin: 0.7 mg/dL (ref 0.3–1.2)
Total Protein: 6.2 g/dL — ABNORMAL LOW (ref 6.5–8.1)

## 2021-12-08 LAB — LIPASE, BLOOD: Lipase: 42 U/L (ref 11–51)

## 2021-12-08 LAB — TROPONIN I (HIGH SENSITIVITY): Troponin I (High Sensitivity): 10 ng/L (ref <18)

## 2021-12-08 LAB — BRAIN NATRIURETIC PEPTIDE: B Natriuretic Peptide: 192.8 pg/mL — ABNORMAL HIGH (ref 0.0–100.0)

## 2021-12-08 LAB — LACTIC ACID, PLASMA: Lactic Acid, Venous: 0.9 mmol/L (ref 0.5–1.9)

## 2021-12-08 MED ORDER — CEFDINIR 300 MG PO CAPS: 300.0000 mg | ORAL_CAPSULE | Freq: Two times a day (BID) | ORAL | 0 refills | Status: AC

## 2021-12-08 MED ORDER — CEFDINIR 300 MG PO CAPS: 300.0000 mg | ORAL_CAPSULE | Freq: Two times a day (BID) | ORAL | 0 refills | Status: DC

## 2021-12-08 MED ORDER — AZITHROMYCIN 250 MG PO TABS: 250.0000 mg | ORAL_TABLET | Freq: Every day | ORAL | 0 refills | Status: DC

## 2021-12-08 MED ORDER — AZITHROMYCIN 250 MG PO TABS
500.0000 mg | ORAL_TABLET | Freq: Once | ORAL | Status: AC
  Administered 2021-12-08: 500 mg via ORAL
  Filled 2021-12-08: qty 2

## 2021-12-08 MED ORDER — CEFDINIR 300 MG PO CAPS
300.0000 mg | ORAL_CAPSULE | Freq: Once | ORAL | Status: AC
  Administered 2021-12-08: 300 mg via ORAL
  Filled 2021-12-08: qty 1

## 2021-12-08 MED ORDER — AZITHROMYCIN 250 MG PO TABS: 250.0000 mg | ORAL_TABLET | Freq: Every day | ORAL | 0 refills | Status: AC

## 2021-12-08 MED ORDER — SODIUM CHLORIDE 0.9 % IV BOLUS
1000.0000 mL | Freq: Once | INTRAVENOUS | Status: AC
  Administered 2021-12-08: 1000 mL via INTRAVENOUS

## 2021-12-08 MED ORDER — ASPIRIN 81 MG PO CHEW: 324.0000 mg | CHEWABLE_TABLET | Freq: Once | ORAL | Status: DC

## 2021-12-08 NOTE — Discharge Instructions (Addendum)
Monitor your condition carefully and do not hesitate to return here for concerning changes in your condition. ?

## 2021-12-08 NOTE — ED Provider Notes (Addendum)
?[facility] ?Provider Note ? ? ?CSN: [id number] ?Arrival date & time: 12/08/21  1635 ? ?  ? ?History ? ?Chief Complaint  ?Patient presents with  ? Chest Pain  ? ? ?Pedro Pope. is a 81 y.o. male. ? ?HPI ?Patient presents with concern of left upper chest pain.  Pain has been present for days to weeks, worse with breathing, coughing, but improved with exertion.  He has a history of MI 7 years ago, has been taking his medication regularly and was well prior to the onset of this illness.  Today he went to urgent care, with concern for possible pneumonia he was sent here for evaluation. ?  ? ?Home Medications ?Prior to Admission medications   ?Medication Sig Start Date End Date Taking? Authorizing Provider  ?Ascorbic Acid (VITAMIN C) 500 MG tablet Take 500 mg by mouth daily.      [provider]  ?ascorbic acid (VITAMIN C) 500 MG tablet Vitamin C 500 mg tablet ?  1 tablet every day by oral route. 11/01/14   [provider]  ?atorvastatin (LIPITOR) 40 MG tablet TAKE 1 TABLET BY MOUTH EVERY DAY AT 6PM 07/03/21   Lorretta Harp, MD  ?Blood Pressure Monitor KIT For daily blood pressure monitoring;  ?Arm Cuff 06/05/15   Lorretta Harp, MD  ?carvedilol (COREG) 3.125 MG tablet TAKE 1 TABLET BY MOUTH TWICE A DAY WITH MEALS 07/27/20   Lorretta Harp, MD  ?Cholecalciferol 25 MCG (1000 UT) tablet Take by mouth daily.    [provider]  ?clopidogrel (PLAVIX) 75 MG tablet TAKE 1 TABLET BY MOUTH EVERY DAY 06/04/21   Lorretta Harp, MD  ?colchicine 0.6 MG tablet Take 1 tablet (0.6 mg total) by mouth daily. ?Patient not taking: Reported on 06/12/2021 11/06/14   Eileen Stanford, PA-C  ?nitroGLYCERIN (NITROSTAT) 0.4 MG SL tablet Place 1 tablet (0.4 mg total) under the tongue every 5 (five) minutes as needed for chest pain. 06/28/21   Lorretta Harp, MD  ?pentosan polysulfate (ELMIRON) 100 MG capsule Take 100 mg by mouth 3 (three) times daily.     [provider]  ?XARELTO 20 MG TABS tablet TAKE 1 TABLET BY MOUTH EVERY DAY WITH SUPPER ?Patient not taking: Reported on 06/12/2021 11/23/20   Lorretta Harp, MD  ?   ? ?Allergies    ?Penicillins, Corn syrup [glucose], and Aspirin adult low [aspirin]   ? ?Review of Systems   ?Review of Systems  ?Constitutional:   ?     Per HPI, otherwise negative  ?HENT:    ?     Per HPI, otherwise negative  ?Respiratory:    ?     Per HPI, otherwise negative  ?Cardiovascular:   ?     Per HPI, otherwise negative  ?Gastrointestinal:  Negative for vomiting.  ?Endocrine:  ?     Negative aside from HPI  ?Genitourinary:   ?     Neg aside from HPI   ?Musculoskeletal:   ?     Per HPI, otherwise negative  ?Skin: Negative.   ?Neurological:  Negative for syncope.  ? ?Physical Exam ?Updated Vital Signs ?BP (!) 135/59   Pulse (!) 54   Temp 97.8 ?F (36.6 ?C) (Oral)   Resp 15   Ht 5' 8.5" (1.74 m)   Wt 63.5 kg   SpO2 100%   BMI 20.98 kg/m?  ?Physical Exam ?Vitals and nursing note reviewed.  ?Constitutional:   ?  General: He is not in acute distress. ?   Appearance: He is well-developed.  ?HENT:  ?   Head: Normocephalic and atraumatic.  ?Eyes:  ?   Conjunctiva/sclera: Conjunctivae normal.  ?Cardiovascular:  ?   Rate and Rhythm: Normal rate and regular rhythm.  ?Pulmonary:  ?   Effort: Pulmonary effort is normal. No respiratory distress.  ?   Breath sounds: No stridor.  ?Abdominal:  ?   General: There is no distension.  ?Skin: ?   General: Skin is warm and dry.  ?Neurological:  ?   Mental Status: He is alert and oriented to person, place, and time.  ? ? ?ED Results / Procedures / Treatments   ?Labs ?(all labs ordered are listed, but only abnormal results are displayed) ?Labs Reviewed  ?COMPREHENSIVE METABOLIC PANEL - Abnormal; Notable for the following components:  ?    Result Value  ? Calcium 8.7 (*)   ? Total Protein 6.2 (*)   ? Albumin 3.4 (*)   ? All other components within normal limits  ?BRAIN NATRIURETIC PEPTIDE - Abnormal;  Notable for the following components:  ? B Natriuretic Peptide 192.8 (*)   ? All other components within normal limits  ?CBC  ?LIPASE, BLOOD  ?LACTIC ACID, PLASMA  ?CK  ?CBG MONITORING, ED  ?TROPONIN I (HIGH SENSITIVITY)  ?TROPONIN I (HIGH SENSITIVITY)  ? ? ?EKG ?EKG Interpretation ? ?Date/Time:  Sunday December 08 2021 16:49:56 EDT ?Ventricular Rate:  52 ?PR Interval:  219 ?QRS Duration: 105 ?QT Interval:  493 ?QTC Calculation: 459 ?R Axis:   56 ?Text Interpretation: Sinus rhythm Borderline prolonged PR interval Probable anterior infarct, age indeterminate T wave abnormality Abnormal ECG Confirmed by Carmin Muskrat [phone]) on 12/08/2021 4:52:03 PM ? ?Radiology ?DG Chest Portable 1 View ? ?Result Date: 12/08/2021 ?CLINICAL DATA:  Left upper chest pain for 1 week. EXAM: PORTABLE CHEST 1 VIEW COMPARISON:  Chest two views 11/03/2014 FINDINGS: There is heterogeneous opacification of the lateral mid and inferior aspect of the left upper lobe suggesting pneumonia. Baseline chronic mild interstitial thickening bilaterally. No definite pleural effusion. No pneumothorax. Cardiac silhouette and mediastinal contours are within normal limits with mild calcification within aortic arch. IMPRESSION: Moderate to high-grade lateral left upper lobe heterogeneous airspace opacification suspicious for pneumonia. Recommend follow-up radiographs 6 weeks after treatment to ensure resolution. Electronically Signed   By: Yvonne Kendall M.D.   On: 12/08/2021 18:34   ? ?Procedures ?Procedures  ? ? ?Medications Ordered in ED ?Medications  ?cefdinir (OMNICEF) capsule 300 mg (has no administration in time range)  ?azithromycin (ZITHROMAX) tablet 500 mg (has no administration in time range)  ?sodium chloride 0.9 % bolus 1,000 mL (1,000 mLs Intravenous New Bag/Given 12/08/21 1805)  ? ? ?ED Course/ Medical Decision Making/ A&P ?This patient with a Hx of MI chest pain presents to the ED for concern of chest pain, this involves an extensive number of  treatment options, and is a complaint that carries with it a high risk of complications and morbidity.   ? ?The differential diagnosis includes ACS, pneumonia, pneumothorax, bacteremia, sepsis less likely PE ? ? ?Social Determinants of Health: ? ?Age ? ?Additional history obtained: ? ?Additional history and/or information obtained from urgent care notes, ECG, notable for details of x-ray from urgent care, ECG, x-ray concerning for possible pneumonia left upper lobe, ECG with biphasic T wave ? ? ?After the initial evaluation, orders, including: X-ray, ECG, monitoring were initiated. ? ? ?Urgent care ECG with sinus rhythm, rate 53 ST  wave changes abnormal ?EMS rhythm strip rate 57 ST changes abnormal ? ?Patient placed on Cardiac and Pulse-Oximetry Monitors. ?The patient was maintained on a cardiac monitor.  The cardiac monitored showed an rhythm of sinus 60 normal ?The patient was also maintained on pulse oximetry. The readings were typically 99% room air normal ? ? ?On repeat evaluation of the patient improved ? ?Lab Tests: ? ?I personally interpreted labs.  The pertinent results include: Unremarkable labs, normal troponin ? ?Imaging Studies ordered: ? ?I independently visualized and interpreted imaging which showed  X-ray with lateral superior left opacification concerning for pneumonia ?I agree with the radiologist interpretation ? ?Consultations Obtained: ? ?I requested consultation with the pharmacy given the patient's allergy history,  and discussed lab and imaging findings as well as pertinent plan - they recommend: Omnicef, azithromycin ? ?Dispostion / Final MDM: ?7:36 PM ?Patient coming by his wife and son.  We lengthy conversation about all findings ?After consideration of the diagnostic results and the patient's response to treatment, patient will be discharged after initiation of antibiotics here.  Findings suggest pneumonia contributing to the patient's left upper chest wall pain.  Does have a history of  MI, risk profile is lifted but without abnormal ECG, with normal troponin, and with exertional improvement rather than worsening of symptoms, low suspicion for atypical ACS.  No evidence for bacteremia, sepsis, he ha

## 2021-12-08 NOTE — ED Triage Notes (Signed)
Pt BIB [facility] for L upper chest pain, pain has been ongoing for a week, worsened last PM, increases significantly with lying down. Pt arrives from [facility], EKG at [facility] showed possible ST-elevation. Hx MI appx 7 years ago.  ? ?EMS VS- HR 56 (hx bradycardia), 120/60, CBG 95  ?

## 2021-12-22 ENCOUNTER — Other Ambulatory Visit: Payer: Self-pay | Admitting: Cardiovascular Disease

## 2021-12-23 ENCOUNTER — Other Ambulatory Visit (HOSPITAL_BASED_OUTPATIENT_CLINIC_OR_DEPARTMENT_OTHER): Payer: Self-pay | Admitting: Adult Health

## 2021-12-23 ENCOUNTER — Other Ambulatory Visit: Payer: Self-pay | Admitting: Adult Health

## 2021-12-23 ENCOUNTER — Ambulatory Visit (HOSPITAL_COMMUNITY)
Admission: RE | Admit: 2021-12-23 | Discharge: 2021-12-23 | Disposition: A | Discharge status: Home / Self Care | Payer: Medicare Other | Source: Ambulatory Visit | Attending: Adult Health | Admitting: Adult Health

## 2021-12-23 DIAGNOSIS — I700 Atherosclerosis of aorta: Secondary | ICD-10-CM | POA: Diagnosis not present

## 2021-12-23 DIAGNOSIS — J189 Pneumonia, unspecified organism: Secondary | ICD-10-CM

## 2021-12-23 DIAGNOSIS — I2510 Atherosclerotic heart disease of native coronary artery without angina pectoris: Secondary | ICD-10-CM | POA: Diagnosis not present

## 2021-12-23 DIAGNOSIS — J181 Lobar pneumonia, unspecified organism: Secondary | ICD-10-CM | POA: Diagnosis not present

## 2021-12-23 DIAGNOSIS — J432 Centrilobular emphysema: Secondary | ICD-10-CM | POA: Diagnosis not present

## 2021-12-23 DIAGNOSIS — I509 Heart failure, unspecified: Secondary | ICD-10-CM | POA: Diagnosis not present

## 2021-12-23 DIAGNOSIS — J439 Emphysema, unspecified: Secondary | ICD-10-CM | POA: Diagnosis not present

## 2021-12-23 DIAGNOSIS — J90 Pleural effusion, not elsewhere classified: Secondary | ICD-10-CM | POA: Diagnosis not present

## 2021-12-23 DIAGNOSIS — R058 Other specified cough: Secondary | ICD-10-CM | POA: Diagnosis not present

## 2021-12-23 DIAGNOSIS — J918 Pleural effusion in other conditions classified elsewhere: Secondary | ICD-10-CM | POA: Diagnosis not present

## 2021-12-23 DIAGNOSIS — J159 Unspecified bacterial pneumonia: Secondary | ICD-10-CM | POA: Diagnosis not present

## 2021-12-23 DIAGNOSIS — J479 Bronchiectasis, uncomplicated: Secondary | ICD-10-CM | POA: Diagnosis not present

## 2021-12-25 ENCOUNTER — Telehealth: Payer: Self-pay | Admitting: Pulmonary Disease

## 2021-12-25 ENCOUNTER — Encounter: Payer: Self-pay | Admitting: Pulmonary Disease

## 2021-12-25 ENCOUNTER — Ambulatory Visit (INDEPENDENT_AMBULATORY_CARE_PROVIDER_SITE_OTHER): Payer: Medicare Other | Admitting: Pulmonary Disease

## 2021-12-25 ENCOUNTER — Ambulatory Visit: Payer: Medicare Other | Admitting: Gastroenterology

## 2021-12-25 VITALS — BP 116/62 | HR 83 | Temp 98.1°F | Ht 69.0 in | Wt 140.6 lb

## 2021-12-25 DIAGNOSIS — J181 Lobar pneumonia, unspecified organism: Secondary | ICD-10-CM

## 2021-12-25 DIAGNOSIS — J189 Pneumonia, unspecified organism: Secondary | ICD-10-CM

## 2021-12-25 DIAGNOSIS — J918 Pleural effusion in other conditions classified elsewhere: Secondary | ICD-10-CM | POA: Diagnosis not present

## 2021-12-25 NOTE — Patient Instructions (Signed)
Nice to meet you ? ?I recommend draining the fluid around the long since it is associated with your recent pneumonia ? ?I recommend a thoracentesis to be performed as an outpatient at [facility] next week.  We will try to arrange this to be done on Tuesday.  We should be in touch with date and time. ? ?In anticipation to reduce the risk of bleeding, please stop your Plavix starting tomorrow.  I will send a message to Dr. Gwenlyn Found to make sure this continues to be okay. ? ?We will need to repeat a CT scan in 6 to 8 weeks.  I will order this now and plan to see you shortly thereafter to discuss results in the office. ? ?Return to clinic in 6 to 8 weeks or sooner as needed with Dr. Silas Flood ?

## 2021-12-25 NOTE — Progress Notes (Signed)
? ?@Patient  ID: Pedro Pope., male    DOB: 1941-01-29, 81 y.o.   MRN: [id number] ? ?Chief Complaint  ?Patient presents with  ? Consult  ?  Pt is here for PNA that started in April and is on antibiotics. Pt states he also has emphysema that symptoms that were noted last year. Pain in upper left chest and lower left rib area. Pt states that he was told he had heavy fluid in the lower left part of his lung.   ? ? ?Referring provider: ?Pedro Baton, MD ? ?HPI:  ? ?81 y.o. man whom we are seeing in consultation for evaluation of pneumonia and parapneumonic effusion.  ED note reviewed.  Recent PCP note reviewed.  Most recent cardiology note reviewed. ? ?He was in usual health.  Significant smoking history.  Quit in the mid 88s.  No issues with dyspnea, cough etc.  Still works.  Unfortunately, developed chest pain and some cough, fever.  Went to the ED 11/2022.  Diagnosed with pneumonia based on chest x-ray on my review interpretation shows left peripheral midlung field opacity, scattered nodular surrounding, clear on the right, cannot totally assess presence of pleural effusion as left lung base is cut off, hyperinflation is present.  Placed on antibiotics.  Mild improvement in symptoms.  Unfortunate, developed recurrent pleuritic chest pain.  Had moved slightly, involve more the chest as opposed to upper chest initially.  This prompted CT scan 12/23/2021 on my review interpretation shows dense confluent airspace disease with peripheral nodularity and associated moderate left pleural effusion that appears relatively free-flowing with subtle early signs of loculation.  He was placed on levofloxacin at that point time.  He referred here for further evaluation. ? ?Overall, cough better.  Producing some sputum.  Still with pleuritic chest pain.  No fevers.  Overall improving but still residual symptoms. ? ?PMH: Sinus problem, CAD status post stent LAD 10/2014, atrial fibrillation, emphysema, prostate cancer, seasonal  allergies ?Surgical history: Tonsillectomy, stent as above ?Family history: CAD in first-degree relatives ?Social history: Former smoker, quit in 1994, lives in [place] ? ? ?Questionaires / Pulmonary Flowsheets:  ? ?ACT:  ?   ? View : No data to display.  ?  ?  ?  ? ? ?MMRC: ?   ? View : No data to display.  ?  ?  ?  ? ? ?Epworth:  ?   ? View : No data to display.  ?  ?  ?  ? ? ?Tests:  ? ?FENO:  ?No results found for: NITRICOXIDE ? ?PFT: ?   ? View : No data to display.  ?  ?  ?  ? ? ?WALK:  ?   ? View : No data to display.  ?  ?  ?  ? ? ?Imaging: ?Personally reviewed and as per EMR ?CT CHEST WO CONTRAST ? ?Result Date: 12/23/2021 ?CLINICAL DATA:  Pneumonia EXAM: CT CHEST WITHOUT CONTRAST TECHNIQUE: Multidetector CT imaging of the chest was performed following the standard protocol without IV contrast. RADIATION DOSE REDUCTION: This exam was performed according to the departmental dose-optimization program which includes automated exposure control, adjustment of the mA and/or kV according to patient size and/or use of iterative reconstruction technique. COMPARISON:  Radiograph 12/08/2021 FINDINGS: Cardiovascular: Normal cardiac size.No pericardial disease.Coronary artery calcifications. There is unchanged subendocardial hypoattenuation in the left ventricle associated thinning of the ventricular wall compatible with chronic LAD territory infarct.There is moderate atherosclerosis of the thoracic aorta. Mediastinum/Nodes: There is mediastinal and hilar  lymphadenopathy. For reference, left hilar lymph node measures up to 1.2 cm short axis (series 2, image 91). Left paratracheal lymph node measures to 1.0 cm short axis (series 2, image 66).The thyroid is unremarkable.Esophagus is unremarkable.The trachea is unremarkable. Lungs/Pleura: There is a moderate size left pleural effusion. There is a persistent left upper lobe airspace consolidation with mucoid impaction and some nodular areas. There is centrilobular and  paraseptal emphysema, mid apical predominant.There is subpleural reticulation and bronchiolectasis likely reflecting mild pulmonary fibrosis. No pneumothorax. Upper Abdomen: No acute abnormality. Musculoskeletal: Mild pectus deformity. No acute osseous abnormality. No suspicious lytic or blastic lesions. Multilevel degenerative changes of the spine. IMPRESSION: Severe left upper lobe pneumonia with mucoid impaction and nodular areas. Mediastinal and left hilar lymphadenopathy, likely reactive. Moderate size left pleural effusion. Recommend follow-up chest CT in 3 months after treatment to ensure resolution and rule out underlying neoplasm. Mid to apical predominant centrilobular and paraseptal emphysema. Coronary artery atherosclerosis with evidence of chronic LAD territory infarct. Aortic Atherosclerosis (ICD10-I70.0) and Emphysema (ICD10-J43.9). Electronically Signed   By: Maurine Simmering M.D.   On: 12/23/2021 16:12  ? ?DG Chest Portable 1 View ? ?Result Date: 12/08/2021 ?CLINICAL DATA:  Left upper chest pain for 1 week. EXAM: PORTABLE CHEST 1 VIEW COMPARISON:  Chest two views 11/03/2014 FINDINGS: There is heterogeneous opacification of the lateral mid and inferior aspect of the left upper lobe suggesting pneumonia. Baseline chronic mild interstitial thickening bilaterally. No definite pleural effusion. No pneumothorax. Cardiac silhouette and mediastinal contours are within normal limits with mild calcification within aortic arch. IMPRESSION: Moderate to high-grade lateral left upper lobe heterogeneous airspace opacification suspicious for pneumonia. Recommend follow-up radiographs 6 weeks after treatment to ensure resolution. Electronically Signed   By: Yvonne Kendall M.D.   On: 12/08/2021 18:34   ? ?Lab Results: ?Personally reviewed ?CBC ?   ?Component Value Date/Time  ? WBC 8.9 12/08/2021 1717  ? RBC 4.65 12/08/2021 1717  ? HGB 13.9 12/08/2021 1717  ? HCT 43.3 12/08/2021 1717  ? PLT 212 12/08/2021 1717  ? MCV 93.1  12/08/2021 1717  ? MCH 29.9 12/08/2021 1717  ? MCHC 32.1 12/08/2021 1717  ? RDW 13.2 12/08/2021 1717  ? LYMPHSABS 1.8 04/11/2021 1252  ? MONOABS 0.8 04/11/2021 1252  ? EOSABS 0.1 04/11/2021 1252  ? BASOSABS 0.0 04/11/2021 1252  ? ? ?BMET ?   ?Component Value Date/Time  ? NA 139 12/08/2021 1717  ? K 4.1 12/08/2021 1717  ? CL 108 12/08/2021 1717  ? CO2 25 12/08/2021 1717  ? GLUCOSE 91 12/08/2021 1717  ? BUN 13 12/08/2021 1717  ? CREATININE 0.92 12/08/2021 1717  ? CALCIUM 8.7 (L) 12/08/2021 1717  ? GFRNONAA >60 12/08/2021 1717  ? GFRAA 75 (L) 11/04/2014 0415  ? ? ?BNP ?   ?Component Value Date/Time  ? BNP 192.8 (H) 12/08/2021 1717  ? ? ?ProBNP ?No results found for: PROBNP ? ?Specialty Problems   ?None ? ? ?Allergies  ?Allergen Reactions  ? Penicillins Anaphylaxis  ? Corn Syrup [Glucose] Other (See Comments)  ?  High fructose corn syrup causes gout  ? Aspirin Adult Low [Aspirin] Itching and Rash  ?  Dr. [name] suspects that long term caused a rash, short term use appears to be ok  ? ? ?Immunization History  ?Administered Date(s) Administered  ? Influenza Split 05/20/2012, 09/01/2013  ? Influenza, High Dose Seasonal PF 04/23/2017  ? Influenza, Quadrivalent, Recombinant, Inj, Pf 04/08/2018, 07/18/2020  ? Influenza,inj,Quad PF,6+ Mos 11/01/2014  ? PFIZER  Comirnaty(Gray Top)Covid-19 Tri-Sucrose Vaccine 12/16/2019, 05/18/2020  ? PFIZER(Purple Top)SARS-COV-2 Vaccination 10/14/2019, 11/08/2019  ? Pneumococcal Conjugate-13 09/01/2013, 10/01/2017  ? Pneumococcal Polysaccharide-23 11/01/2014  ? Zoster, Live 09/01/2013  ? ? ?Past Medical History:  ?Diagnosis Date  ? Anosmia   ? CAD (coronary artery disease), native coronary artery   ? a. 10/29/2013 antlat STEMI s/p DES to pLAD; residual 80 % proximal and 60% mid LCx diease  ? Chronic headaches   ? 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 a. placed on colchicine   ? ED (erectile dysfunction)   ? Elevated TSH   ? Fatty liver   ? Gout   ? Ischemic cardiomyopathy   ? a. 2D ECHO 11/04/14 w/ EF  40-45% with LV WMA, G2DD, mild MR, mild LA dilation, mod TR.  ? PAF (paroxysmal atrial fibrillation) ([place])   ? a. Dx on 11/03/14 admission. Not placed on longterm AC due to DAPT w/ receent DES. Will plan for outpt heart

## 2021-12-25 NOTE — H&P (View-Only) (Signed)
? ?@Patient  ID: Pedro Needle., male    DOB: 06-16-1941, 81 y.o.   MRN: [id number] ? ?Chief Complaint  ?Patient presents with  ? Consult  ?  Pt is here for PNA that started in April and is on antibiotics. Pt states he also has emphysema that symptoms that were noted last year. Pain in upper left chest and lower left rib area. Pt states that he was told he had heavy fluid in the lower left part of his lung.   ? ? ?Referring provider: ?Shon Baton, MD ? ?HPI:  ? ?81 y.o. man whom we are seeing in consultation for evaluation of pneumonia and parapneumonic effusion.  ED note reviewed.  Recent PCP note reviewed.  Most recent cardiology note reviewed. ? ?He was in usual health.  Significant smoking history.  Quit in the mid 66s.  No issues with dyspnea, cough etc.  Still works.  Unfortunately, developed chest pain and some cough, fever.  Went to the ED 11/2022.  Diagnosed with pneumonia based on chest x-ray on my review interpretation shows left peripheral midlung field opacity, scattered nodular surrounding, clear on the right, cannot totally assess presence of pleural effusion as left lung base is cut off, hyperinflation is present.  Placed on antibiotics.  Mild improvement in symptoms.  Unfortunate, developed recurrent pleuritic chest pain.  Had moved slightly, involve more the chest as opposed to upper chest initially.  This prompted CT scan 12/23/2021 on my review interpretation shows dense confluent airspace disease with peripheral nodularity and associated moderate left pleural effusion that appears relatively free-flowing with subtle early signs of loculation.  He was placed on levofloxacin at that point time.  He referred here for further evaluation. ? ?Overall, cough better.  Producing some sputum.  Still with pleuritic chest pain.  No fevers.  Overall improving but still residual symptoms. ? ?PMH: Sinus problem, CAD status post stent LAD 10/2014, atrial fibrillation, emphysema, prostate cancer, seasonal  allergies ?Surgical history: Tonsillectomy, stent as above ?Family history: CAD in first-degree relatives ?Social history: Former smoker, quit in 1994, lives in [place] ? ? ?Questionaires / Pulmonary Flowsheets:  ? ?ACT:  ?   ? View : No data to display.  ?  ?  ?  ? ? ?MMRC: ?   ? View : No data to display.  ?  ?  ?  ? ? ?Epworth:  ?   ? View : No data to display.  ?  ?  ?  ? ? ?Tests:  ? ?FENO:  ?No results found for: NITRICOXIDE ? ?PFT: ?   ? View : No data to display.  ?  ?  ?  ? ? ?WALK:  ?   ? View : No data to display.  ?  ?  ?  ? ? ?Imaging: ?Personally reviewed and as per EMR ?CT CHEST WO CONTRAST ? ?Result Date: 12/23/2021 ?CLINICAL DATA:  Pneumonia EXAM: CT CHEST WITHOUT CONTRAST TECHNIQUE: Multidetector CT imaging of the chest was performed following the standard protocol without IV contrast. RADIATION DOSE REDUCTION: This exam was performed according to the departmental dose-optimization program which includes automated exposure control, adjustment of the mA and/or kV according to patient size and/or use of iterative reconstruction technique. COMPARISON:  Radiograph 12/08/2021 FINDINGS: Cardiovascular: Normal cardiac size.No pericardial disease.Coronary artery calcifications. There is unchanged subendocardial hypoattenuation in the left ventricle associated thinning of the ventricular wall compatible with chronic LAD territory infarct.There is moderate atherosclerosis of the thoracic aorta. Mediastinum/Nodes: There is mediastinal and hilar  lymphadenopathy. For reference, left hilar lymph node measures up to 1.2 cm short axis (series 2, image 91). Left paratracheal lymph node measures to 1.0 cm short axis (series 2, image 66).The thyroid is unremarkable.Esophagus is unremarkable.The trachea is unremarkable. Lungs/Pleura: There is a moderate size left pleural effusion. There is a persistent left upper lobe airspace consolidation with mucoid impaction and some nodular areas. There is centrilobular and  paraseptal emphysema, mid apical predominant.There is subpleural reticulation and bronchiolectasis likely reflecting mild pulmonary fibrosis. No pneumothorax. Upper Abdomen: No acute abnormality. Musculoskeletal: Mild pectus deformity. No acute osseous abnormality. No suspicious lytic or blastic lesions. Multilevel degenerative changes of the spine. IMPRESSION: Severe left upper lobe pneumonia with mucoid impaction and nodular areas. Mediastinal and left hilar lymphadenopathy, likely reactive. Moderate size left pleural effusion. Recommend follow-up chest CT in 3 months after treatment to ensure resolution and rule out underlying neoplasm. Mid to apical predominant centrilobular and paraseptal emphysema. Coronary artery atherosclerosis with evidence of chronic LAD territory infarct. Aortic Atherosclerosis (ICD10-I70.0) and Emphysema (ICD10-J43.9). Electronically Signed   By: Maurine Simmering M.D.   On: 12/23/2021 16:12  ? ?DG Chest Portable 1 View ? ?Result Date: 12/08/2021 ?CLINICAL DATA:  Left upper chest pain for 1 week. EXAM: PORTABLE CHEST 1 VIEW COMPARISON:  Chest two views 11/03/2014 FINDINGS: There is heterogeneous opacification of the lateral mid and inferior aspect of the left upper lobe suggesting pneumonia. Baseline chronic mild interstitial thickening bilaterally. No definite pleural effusion. No pneumothorax. Cardiac silhouette and mediastinal contours are within normal limits with mild calcification within aortic arch. IMPRESSION: Moderate to high-grade lateral left upper lobe heterogeneous airspace opacification suspicious for pneumonia. Recommend follow-up radiographs 6 weeks after treatment to ensure resolution. Electronically Signed   By: Yvonne Kendall M.D.   On: 12/08/2021 18:34   ? ?Lab Results: ?Personally reviewed ?CBC ?   ?Component Value Date/Time  ? WBC 8.9 12/08/2021 1717  ? RBC 4.65 12/08/2021 1717  ? HGB 13.9 12/08/2021 1717  ? HCT 43.3 12/08/2021 1717  ? PLT 212 12/08/2021 1717  ? MCV 93.1  12/08/2021 1717  ? MCH 29.9 12/08/2021 1717  ? MCHC 32.1 12/08/2021 1717  ? RDW 13.2 12/08/2021 1717  ? LYMPHSABS 1.8 04/11/2021 1252  ? MONOABS 0.8 04/11/2021 1252  ? EOSABS 0.1 04/11/2021 1252  ? BASOSABS 0.0 04/11/2021 1252  ? ? ?BMET ?   ?Component Value Date/Time  ? NA 139 12/08/2021 1717  ? K 4.1 12/08/2021 1717  ? CL 108 12/08/2021 1717  ? CO2 25 12/08/2021 1717  ? GLUCOSE 91 12/08/2021 1717  ? BUN 13 12/08/2021 1717  ? CREATININE 0.92 12/08/2021 1717  ? CALCIUM 8.7 (L) 12/08/2021 1717  ? GFRNONAA >60 12/08/2021 1717  ? GFRAA 75 (L) 11/04/2014 0415  ? ? ?BNP ?   ?Component Value Date/Time  ? BNP 192.8 (H) 12/08/2021 1717  ? ? ?ProBNP ?No results found for: PROBNP ? ?Specialty Problems   ?None ? ? ?Allergies  ?Allergen Reactions  ? Penicillins Anaphylaxis  ? Corn Syrup [Glucose] Other (See Comments)  ?  High fructose corn syrup causes gout  ? Aspirin Adult Low [Aspirin] Itching and Rash  ?  Dr. [name] suspects that long term caused a rash, short term use appears to be ok  ? ? ?Immunization History  ?Administered Date(s) Administered  ? Influenza Split 05/20/2012, 09/01/2013  ? Influenza, High Dose Seasonal PF 04/23/2017  ? Influenza, Quadrivalent, Recombinant, Inj, Pf 04/08/2018, 07/18/2020  ? Influenza,inj,Quad PF,6+ Mos 11/01/2014  ? PFIZER  Comirnaty(Gray Top)Covid-19 Tri-Sucrose Vaccine 12/16/2019, 05/18/2020  ? PFIZER(Purple Top)SARS-COV-2 Vaccination 10/14/2019, 11/08/2019  ? Pneumococcal Conjugate-13 09/01/2013, 10/01/2017  ? Pneumococcal Polysaccharide-23 11/01/2014  ? Zoster, Live 09/01/2013  ? ? ?Past Medical History:  ?Diagnosis Date  ? Anosmia   ? CAD (coronary artery disease), native coronary artery   ? a. 10/29/2013 antlat STEMI s/p DES to pLAD; residual 80 % proximal and 60% mid LCx diease  ? Chronic headaches   ? 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 a. placed on colchicine   ? ED (erectile dysfunction)   ? Elevated TSH   ? Fatty liver   ? Gout   ? Ischemic cardiomyopathy   ? a. 2D ECHO 11/04/14 w/ EF  40-45% with LV WMA, G2DD, mild MR, mild LA dilation, mod TR.  ? PAF (paroxysmal atrial fibrillation) ([place])   ? a. Dx on 11/03/14 admission. Not placed on longterm AC due to DAPT w/ receent DES. Will plan for outpt heart

## 2021-12-25 NOTE — Telephone Encounter (Signed)
Noted to Dr Silas Flood. Will try to get procedure done Monday but still plans to shoots for next Tuesday. Nothing further needed  ?

## 2021-12-31 ENCOUNTER — Encounter (HOSPITAL_COMMUNITY): Payer: Self-pay | Admitting: Internal Medicine

## 2021-12-31 ENCOUNTER — Ambulatory Visit (HOSPITAL_COMMUNITY)
Admission: RE | Admit: 2021-12-31 | Discharge: 2021-12-31 | Disposition: A | Discharge status: Home / Self Care | Payer: Medicare Other | Source: Ambulatory Visit | Attending: Internal Medicine | Admitting: Internal Medicine

## 2021-12-31 ENCOUNTER — Encounter (HOSPITAL_COMMUNITY)
Admission: RE | Disposition: A | Discharge status: Home / Self Care | Payer: Self-pay | Source: Ambulatory Visit | Attending: Internal Medicine

## 2021-12-31 ENCOUNTER — Ambulatory Visit (HOSPITAL_COMMUNITY): Payer: Medicare Other

## 2021-12-31 DIAGNOSIS — Z7902 Long term (current) use of antithrombotics/antiplatelets: Secondary | ICD-10-CM | POA: Diagnosis not present

## 2021-12-31 DIAGNOSIS — J90 Pleural effusion, not elsewhere classified: Secondary | ICD-10-CM | POA: Insufficient documentation

## 2021-12-31 DIAGNOSIS — J189 Pneumonia, unspecified organism: Secondary | ICD-10-CM | POA: Insufficient documentation

## 2021-12-31 DIAGNOSIS — Z87891 Personal history of nicotine dependence: Secondary | ICD-10-CM | POA: Diagnosis not present

## 2021-12-31 DIAGNOSIS — R846 Abnormal cytological findings in specimens from respiratory organs and thorax: Secondary | ICD-10-CM | POA: Diagnosis not present

## 2021-12-31 DIAGNOSIS — R091 Pleurisy: Secondary | ICD-10-CM | POA: Diagnosis not present

## 2021-12-31 HISTORY — PX: THORACENTESIS: SHX235

## 2021-12-31 LAB — BODY FLUID CELL COUNT WITH DIFFERENTIAL
Eos, Fluid: 5 %
Lymphs, Fluid: 77 %
Monocyte-Macrophage-Serous Fluid: 16 % — ABNORMAL LOW (ref 50–90)
Neutrophil Count, Fluid: 2 % (ref 0–25)
Total Nucleated Cell Count, Fluid: 670 cu mm (ref 0–1000)

## 2021-12-31 LAB — PROTEIN, PLEURAL OR PERITONEAL FLUID: Total protein, fluid: 3.6 g/dL

## 2021-12-31 LAB — LACTATE DEHYDROGENASE, PLEURAL OR PERITONEAL FLUID: LD, Fluid: 218 U/L — ABNORMAL HIGH (ref 3–23)

## 2021-12-31 SURGERY — THORACENTESIS
Anesthesia: LOCAL | Laterality: Left

## 2021-12-31 MED ORDER — LIDOCAINE HCL (PF) 1 % IJ SOLN
INTRAMUSCULAR | Status: AC
  Filled 2021-12-31: qty 30

## 2021-12-31 NOTE — Procedures (Signed)
Thoracentesis  Procedure Note ? ?Pedro Pope.  ?[id number]  ?August 02, 1941 ? ?Date:12/31/21  ?Time:2:42 PM  ? ?Provider Performing:Yonis Carreon C Tamala Julian  ? ?Procedure: Thoracentesis with imaging guidance ([id number]) ? ?Indication(s) ?Pleural Effusion ? ?Consent ?Risks of the procedure as well as the alternatives and risks of each were explained to the patient and/or caregiver.  Consent for the procedure was obtained and is signed in the bedside chart ? ?Anesthesia ?Topical only with 1% lidocaine  ? ? ?Time Out ?Verified patient identification, verified procedure, site/side was marked, verified correct patient position, special equipment/implants available, medications/allergies/relevant history reviewed, required imaging and test results available. ? ? ?Sterile Technique ?Maximal sterile technique including full sterile barrier drape, hand hygiene, sterile gown, sterile gloves, mask, hair covering, sterile ultrasound probe cover (if used). ? ?Procedure Description ?Ultrasound was used to identify appropriate pleural anatomy for placement and overlying skin marked.  Area of drainage cleaned and draped in sterile fashion. Lidocaine was used to anesthetize the skin and subcutaneous tissue.  1800 cc's of amber appearing fluid was drained from the left pleural space. Catheter then removed and bandaid applied to site. ? ? ?Complications/Tolerance ?None; patient tolerated the procedure well. ?Chest X-ray is ordered to confirm no post-procedural complication. ? ? ?EBL ?Minimal ? ? ?Specimen(s) ?Pleural fluid ? ? ? ? ? ? ? ? ? ? ?

## 2021-12-31 NOTE — Interval H&P Note (Signed)
Seen and examined. ?Agreeable to proceed with thora. ?All questions answered. ?

## 2022-01-01 ENCOUNTER — Encounter (HOSPITAL_COMMUNITY): Payer: Self-pay | Admitting: Internal Medicine

## 2022-01-02 LAB — CYTOLOGY - NON PAP

## 2022-01-03 ENCOUNTER — Telehealth: Payer: Self-pay | Admitting: Pulmonary Disease

## 2022-01-03 MED ORDER — DOXYCYCLINE HYCLATE 100 MG PO TABS: 100.0000 mg | ORAL_TABLET | Freq: Two times a day (BID) | ORAL | 0 refills | Status: AC

## 2022-01-03 MED ORDER — NAPROXEN 375 MG PO TBEC: 1.0000 | DELAYED_RELEASE_TABLET | Freq: Two times a day (BID) | ORAL | 0 refills | Status: AC

## 2022-01-03 NOTE — Telephone Encounter (Signed)
He reports  that he was told his pain would get better. He reports its a sharp pain in the right upper part of the lung and chest area. He feels its on the inside and he wants to know if may still have infection in his lung. He denies any other symptoms such as chest pressure, cough or shortness of breath. He is taking the Trelegy as directed and wants to know if he would need more Levaquin. Please advise.

## 2022-01-03 NOTE — Telephone Encounter (Signed)
Dr. Silas Flood is requesting  to have CT scan 01/29/2022 moved up to the next week or 2.    Please advise

## 2022-01-03 NOTE — Telephone Encounter (Signed)
Called and spoke with patient.  Still endorsing left-sided chest pain.  Largely pleuritic in nature.  Overall improved since thoracentesis earlier in the week.  Still persistent.  Reviewed most recent chest x-ray 12/31/2021 when compared to 11/2021 shows increased density, no real improvement at the least and left mid to upper lung field consolidation.  For the pain, advised naproxen 1 tablet twice daily, prescription sent.  Use for 7 days then stop.  In addition, will prescribe doxycycline 1 tab twice daily for 10 days.  Given persistent imaging findings, will request to have CT scan 01/29/2022 moved up to the next week or 2.  Do worry about underlying malignancy given lack of radiographic improvement.

## 2022-01-04 LAB — BODY FLUID CULTURE W GRAM STAIN
Culture: NO GROWTH
Gram Stain: NONE SEEN

## 2022-01-06 NOTE — Telephone Encounter (Addendum)
I rescheduled pt to 5/31.  I spoke to him and gave him new appt info.

## 2022-01-14 ENCOUNTER — Telehealth: Payer: Self-pay | Admitting: *Deleted

## 2022-01-14 NOTE — Telephone Encounter (Signed)
Call returned to patient, confirmed DOB. Patient states he has an appt tomorrow for a CT scan. He reports speaking with the MD about pleurisy. He states since the he has developed pain from the top to the bottom of his lungs as well as the back of his lungs and ribs.   I made him aware that his CT will cover his entire chest area. Voiced understanding.   Nothing further needed at this time.

## 2022-01-14 NOTE — Telephone Encounter (Signed)
Patient has CT scan scheduled for tomorrow at 3:30 and patient called to ask if he can have additional areas looked at as well.  He is having some back and rib pain and he also said he is having shortness of breath. Please advise.

## 2022-01-15 ENCOUNTER — Ambulatory Visit (HOSPITAL_COMMUNITY)
Admission: RE | Admit: 2022-01-15 | Discharge: 2022-01-15 | Disposition: A | Discharge status: Home / Self Care | Payer: Medicare Other | Source: Ambulatory Visit | Attending: Pulmonary Disease | Admitting: Pulmonary Disease

## 2022-01-15 DIAGNOSIS — R0902 Hypoxemia: Secondary | ICD-10-CM | POA: Diagnosis not present

## 2022-01-15 DIAGNOSIS — J189 Pneumonia, unspecified organism: Secondary | ICD-10-CM | POA: Diagnosis not present

## 2022-01-15 DIAGNOSIS — Z8249 Family history of ischemic heart disease and other diseases of the circulatory system: Secondary | ICD-10-CM | POA: Diagnosis not present

## 2022-01-15 DIAGNOSIS — J942 Hemothorax: Secondary | ICD-10-CM | POA: Diagnosis not present

## 2022-01-15 DIAGNOSIS — Z880 Allergy status to penicillin: Secondary | ICD-10-CM | POA: Diagnosis not present

## 2022-01-15 DIAGNOSIS — I2510 Atherosclerotic heart disease of native coronary artery without angina pectoris: Secondary | ICD-10-CM | POA: Diagnosis not present

## 2022-01-15 DIAGNOSIS — J181 Lobar pneumonia, unspecified organism: Secondary | ICD-10-CM | POA: Insufficient documentation

## 2022-01-15 DIAGNOSIS — R0689 Other abnormalities of breathing: Secondary | ICD-10-CM | POA: Diagnosis not present

## 2022-01-15 DIAGNOSIS — I480 Paroxysmal atrial fibrillation: Secondary | ICD-10-CM | POA: Diagnosis present

## 2022-01-15 DIAGNOSIS — Z886 Allergy status to analgesic agent status: Secondary | ICD-10-CM | POA: Diagnosis not present

## 2022-01-15 DIAGNOSIS — Z91018 Allergy to other foods: Secondary | ICD-10-CM | POA: Diagnosis not present

## 2022-01-15 DIAGNOSIS — J90 Pleural effusion, not elsewhere classified: Secondary | ICD-10-CM | POA: Diagnosis not present

## 2022-01-15 DIAGNOSIS — R1032 Left lower quadrant pain: Secondary | ICD-10-CM | POA: Diagnosis not present

## 2022-01-15 DIAGNOSIS — Z87891 Personal history of nicotine dependence: Secondary | ICD-10-CM | POA: Diagnosis not present

## 2022-01-15 DIAGNOSIS — Z79899 Other long term (current) drug therapy: Secondary | ICD-10-CM | POA: Diagnosis not present

## 2022-01-15 DIAGNOSIS — J8410 Pulmonary fibrosis, unspecified: Secondary | ICD-10-CM | POA: Diagnosis not present

## 2022-01-15 DIAGNOSIS — R7303 Prediabetes: Secondary | ICD-10-CM | POA: Diagnosis present

## 2022-01-15 DIAGNOSIS — C782 Secondary malignant neoplasm of pleura: Secondary | ICD-10-CM | POA: Diagnosis not present

## 2022-01-15 DIAGNOSIS — E785 Hyperlipidemia, unspecified: Secondary | ICD-10-CM | POA: Diagnosis present

## 2022-01-15 DIAGNOSIS — J9601 Acute respiratory failure with hypoxia: Secondary | ICD-10-CM | POA: Diagnosis not present

## 2022-01-15 DIAGNOSIS — Z20822 Contact with and (suspected) exposure to covid-19: Secondary | ICD-10-CM | POA: Diagnosis not present

## 2022-01-15 DIAGNOSIS — Z955 Presence of coronary angioplasty implant and graft: Secondary | ICD-10-CM | POA: Diagnosis not present

## 2022-01-15 DIAGNOSIS — Z9079 Acquired absence of other genital organ(s): Secondary | ICD-10-CM | POA: Diagnosis not present

## 2022-01-15 DIAGNOSIS — J910 Malignant pleural effusion: Secondary | ICD-10-CM | POA: Diagnosis not present

## 2022-01-15 DIAGNOSIS — I255 Ischemic cardiomyopathy: Secondary | ICD-10-CM | POA: Diagnosis present

## 2022-01-15 DIAGNOSIS — Z8546 Personal history of malignant neoplasm of prostate: Secondary | ICD-10-CM | POA: Diagnosis not present

## 2022-01-15 DIAGNOSIS — R1904 Left lower quadrant abdominal swelling, mass and lump: Secondary | ICD-10-CM | POA: Diagnosis not present

## 2022-01-15 DIAGNOSIS — I10 Essential (primary) hypertension: Secondary | ICD-10-CM | POA: Diagnosis not present

## 2022-01-15 DIAGNOSIS — Z7902 Long term (current) use of antithrombotics/antiplatelets: Secondary | ICD-10-CM | POA: Diagnosis not present

## 2022-01-15 DIAGNOSIS — Y92239 Unspecified place in hospital as the place of occurrence of the external cause: Secondary | ICD-10-CM | POA: Diagnosis not present

## 2022-01-15 DIAGNOSIS — C384 Malignant neoplasm of pleura: Secondary | ICD-10-CM | POA: Diagnosis not present

## 2022-01-15 DIAGNOSIS — R0602 Shortness of breath: Secondary | ICD-10-CM | POA: Diagnosis not present

## 2022-01-15 DIAGNOSIS — I252 Old myocardial infarction: Secondary | ICD-10-CM | POA: Diagnosis not present

## 2022-01-15 DIAGNOSIS — J439 Emphysema, unspecified: Secondary | ICD-10-CM | POA: Diagnosis not present

## 2022-01-15 DIAGNOSIS — J918 Pleural effusion in other conditions classified elsewhere: Secondary | ICD-10-CM | POA: Diagnosis not present

## 2022-01-15 DIAGNOSIS — I241 Dressler's syndrome: Secondary | ICD-10-CM | POA: Diagnosis not present

## 2022-01-15 DIAGNOSIS — Z992 Dependence on renal dialysis: Secondary | ICD-10-CM | POA: Diagnosis not present

## 2022-01-15 DIAGNOSIS — I700 Atherosclerosis of aorta: Secondary | ICD-10-CM | POA: Diagnosis not present

## 2022-01-15 DIAGNOSIS — K760 Fatty (change of) liver, not elsewhere classified: Secondary | ICD-10-CM | POA: Diagnosis not present

## 2022-01-15 DIAGNOSIS — N200 Calculus of kidney: Secondary | ICD-10-CM | POA: Diagnosis not present

## 2022-01-15 DIAGNOSIS — Z9861 Coronary angioplasty status: Secondary | ICD-10-CM | POA: Diagnosis not present

## 2022-01-15 DIAGNOSIS — C801 Malignant (primary) neoplasm, unspecified: Secondary | ICD-10-CM | POA: Diagnosis not present

## 2022-01-16 ENCOUNTER — Other Ambulatory Visit: Payer: Self-pay

## 2022-01-16 DIAGNOSIS — C3490 Malignant neoplasm of unspecified part of unspecified bronchus or lung: Secondary | ICD-10-CM

## 2022-01-16 HISTORY — DX: Malignant neoplasm of unspecified part of unspecified bronchus or lung: C34.90

## 2022-01-17 ENCOUNTER — Other Ambulatory Visit: Payer: Self-pay

## 2022-01-17 ENCOUNTER — Ambulatory Visit (INDEPENDENT_AMBULATORY_CARE_PROVIDER_SITE_OTHER): Payer: Medicare Other | Admitting: Nurse Practitioner

## 2022-01-17 ENCOUNTER — Encounter (HOSPITAL_COMMUNITY): Payer: Self-pay | Admitting: Emergency Medicine

## 2022-01-17 ENCOUNTER — Emergency Department (HOSPITAL_COMMUNITY): Payer: Medicare Other

## 2022-01-17 ENCOUNTER — Inpatient Hospital Stay (HOSPITAL_COMMUNITY)
Admission: EM | Admit: 2022-01-17 | Discharge: 2022-01-23 | DRG: 166 | Disposition: A | Discharge status: Home Health | Payer: Medicare Other | Attending: Internal Medicine | Admitting: Internal Medicine

## 2022-01-17 ENCOUNTER — Telehealth: Payer: Self-pay | Admitting: Pulmonary Disease

## 2022-01-17 ENCOUNTER — Encounter: Payer: Self-pay | Admitting: Nurse Practitioner

## 2022-01-17 DIAGNOSIS — R0689 Other abnormalities of breathing: Secondary | ICD-10-CM | POA: Diagnosis not present

## 2022-01-17 DIAGNOSIS — C801 Malignant (primary) neoplasm, unspecified: Secondary | ICD-10-CM

## 2022-01-17 DIAGNOSIS — Z79899 Other long term (current) drug therapy: Secondary | ICD-10-CM

## 2022-01-17 DIAGNOSIS — K760 Fatty (change of) liver, not elsewhere classified: Secondary | ICD-10-CM | POA: Diagnosis present

## 2022-01-17 DIAGNOSIS — R0602 Shortness of breath: Secondary | ICD-10-CM | POA: Diagnosis not present

## 2022-01-17 DIAGNOSIS — I480 Paroxysmal atrial fibrillation: Secondary | ICD-10-CM

## 2022-01-17 DIAGNOSIS — Z992 Dependence on renal dialysis: Secondary | ICD-10-CM | POA: Diagnosis not present

## 2022-01-17 DIAGNOSIS — Z886 Allergy status to analgesic agent status: Secondary | ICD-10-CM | POA: Diagnosis not present

## 2022-01-17 DIAGNOSIS — Z955 Presence of coronary angioplasty implant and graft: Secondary | ICD-10-CM | POA: Diagnosis not present

## 2022-01-17 DIAGNOSIS — E785 Hyperlipidemia, unspecified: Secondary | ICD-10-CM | POA: Diagnosis present

## 2022-01-17 DIAGNOSIS — R1904 Left lower quadrant abdominal swelling, mass and lump: Secondary | ICD-10-CM

## 2022-01-17 DIAGNOSIS — I241 Dressler's syndrome: Secondary | ICD-10-CM | POA: Diagnosis present

## 2022-01-17 DIAGNOSIS — I255 Ischemic cardiomyopathy: Secondary | ICD-10-CM | POA: Diagnosis present

## 2022-01-17 DIAGNOSIS — J918 Pleural effusion in other conditions classified elsewhere: Secondary | ICD-10-CM | POA: Diagnosis present

## 2022-01-17 DIAGNOSIS — I700 Atherosclerosis of aorta: Secondary | ICD-10-CM | POA: Diagnosis not present

## 2022-01-17 DIAGNOSIS — Z87891 Personal history of nicotine dependence: Secondary | ICD-10-CM

## 2022-01-17 DIAGNOSIS — R7303 Prediabetes: Secondary | ICD-10-CM | POA: Diagnosis present

## 2022-01-17 DIAGNOSIS — Z8249 Family history of ischemic heart disease and other diseases of the circulatory system: Secondary | ICD-10-CM

## 2022-01-17 DIAGNOSIS — I10 Essential (primary) hypertension: Secondary | ICD-10-CM | POA: Diagnosis not present

## 2022-01-17 DIAGNOSIS — Z91018 Allergy to other foods: Secondary | ICD-10-CM | POA: Diagnosis not present

## 2022-01-17 DIAGNOSIS — J9600 Acute respiratory failure, unspecified whether with hypoxia or hypercapnia: Secondary | ICD-10-CM | POA: Insufficient documentation

## 2022-01-17 DIAGNOSIS — I2510 Atherosclerotic heart disease of native coronary artery without angina pectoris: Secondary | ICD-10-CM | POA: Diagnosis present

## 2022-01-17 DIAGNOSIS — R1032 Left lower quadrant pain: Secondary | ICD-10-CM | POA: Diagnosis not present

## 2022-01-17 DIAGNOSIS — Z9861 Coronary angioplasty status: Secondary | ICD-10-CM | POA: Diagnosis not present

## 2022-01-17 DIAGNOSIS — Z20822 Contact with and (suspected) exposure to covid-19: Secondary | ICD-10-CM | POA: Diagnosis present

## 2022-01-17 DIAGNOSIS — J9601 Acute respiratory failure with hypoxia: Secondary | ICD-10-CM | POA: Diagnosis present

## 2022-01-17 DIAGNOSIS — Y92239 Unspecified place in hospital as the place of occurrence of the external cause: Secondary | ICD-10-CM | POA: Diagnosis not present

## 2022-01-17 DIAGNOSIS — C384 Malignant neoplasm of pleura: Secondary | ICD-10-CM | POA: Diagnosis present

## 2022-01-17 DIAGNOSIS — Z9079 Acquired absence of other genital organ(s): Secondary | ICD-10-CM | POA: Diagnosis not present

## 2022-01-17 DIAGNOSIS — T461X5A Adverse effect of calcium-channel blockers, initial encounter: Secondary | ICD-10-CM | POA: Diagnosis not present

## 2022-01-17 DIAGNOSIS — Z7902 Long term (current) use of antithrombotics/antiplatelets: Secondary | ICD-10-CM

## 2022-01-17 DIAGNOSIS — J90 Pleural effusion, not elsewhere classified: Secondary | ICD-10-CM

## 2022-01-17 DIAGNOSIS — C782 Secondary malignant neoplasm of pleura: Secondary | ICD-10-CM | POA: Diagnosis not present

## 2022-01-17 DIAGNOSIS — Z8546 Personal history of malignant neoplasm of prostate: Secondary | ICD-10-CM

## 2022-01-17 DIAGNOSIS — J942 Hemothorax: Secondary | ICD-10-CM

## 2022-01-17 DIAGNOSIS — I252 Old myocardial infarction: Secondary | ICD-10-CM

## 2022-01-17 DIAGNOSIS — I952 Hypotension due to drugs: Secondary | ICD-10-CM | POA: Diagnosis not present

## 2022-01-17 DIAGNOSIS — Z880 Allergy status to penicillin: Secondary | ICD-10-CM

## 2022-01-17 DIAGNOSIS — R0902 Hypoxemia: Secondary | ICD-10-CM | POA: Diagnosis not present

## 2022-01-17 DIAGNOSIS — N200 Calculus of kidney: Secondary | ICD-10-CM | POA: Diagnosis not present

## 2022-01-17 DIAGNOSIS — J439 Emphysema, unspecified: Secondary | ICD-10-CM | POA: Diagnosis not present

## 2022-01-17 DIAGNOSIS — Z833 Family history of diabetes mellitus: Secondary | ICD-10-CM

## 2022-01-17 DIAGNOSIS — J910 Malignant pleural effusion: Secondary | ICD-10-CM | POA: Diagnosis not present

## 2022-01-17 LAB — CBC WITH DIFFERENTIAL/PLATELET
Abs Immature Granulocytes: 0.05 10*3/uL (ref 0.00–0.07)
Basophils Absolute: 0 10*3/uL (ref 0.0–0.1)
Basophils Relative: 0 %
Eosinophils Absolute: 0.2 10*3/uL (ref 0.0–0.5)
Eosinophils Relative: 1 %
HCT: 42.4 % (ref 39.0–52.0)
Hemoglobin: 14.1 g/dL (ref 13.0–17.0)
Immature Granulocytes: 0 %
Lymphocytes Relative: 15 %
Lymphs Abs: 1.8 10*3/uL (ref 0.7–4.0)
MCH: 29.9 pg (ref 26.0–34.0)
MCHC: 33.3 g/dL (ref 30.0–36.0)
MCV: 90 fL (ref 80.0–100.0)
Monocytes Absolute: 0.9 10*3/uL (ref 0.1–1.0)
Monocytes Relative: 8 %
Neutro Abs: 8.7 10*3/uL — ABNORMAL HIGH (ref 1.7–7.7)
Neutrophils Relative %: 76 %
Platelets: 346 10*3/uL (ref 150–400)
RBC: 4.71 MIL/uL (ref 4.22–5.81)
RDW: 13.4 % (ref 11.5–15.5)
WBC: 11.7 10*3/uL — ABNORMAL HIGH (ref 4.0–10.5)
nRBC: 0 % (ref 0.0–0.2)

## 2022-01-17 LAB — COMPREHENSIVE METABOLIC PANEL
ALT: 16 U/L (ref 0–44)
AST: 36 U/L (ref 15–41)
Albumin: 2.1 g/dL — ABNORMAL LOW (ref 3.5–5.0)
Alkaline Phosphatase: 61 U/L (ref 38–126)
Anion gap: 7 (ref 5–15)
BUN: 19 mg/dL (ref 8–23)
CO2: 22 mmol/L (ref 22–32)
Calcium: 8.1 mg/dL — ABNORMAL LOW (ref 8.9–10.3)
Chloride: 111 mmol/L (ref 98–111)
Creatinine, Ser: 0.82 mg/dL (ref 0.61–1.24)
GFR, Estimated: >60 mL/min (ref >60)
Glucose, Bld: 95 mg/dL (ref 70–99)
Potassium: 4.7 mmol/L (ref 3.5–5.1)
Sodium: 140 mmol/L (ref 135–145)
Total Bilirubin: 1.2 mg/dL (ref 0.3–1.2)
Total Protein: 4.9 g/dL — ABNORMAL LOW (ref 6.5–8.1)

## 2022-01-17 LAB — SARS CORONAVIRUS 2 BY RT PCR: SARS Coronavirus 2 by RT PCR: NEGATIVE

## 2022-01-17 LAB — LACTIC ACID, PLASMA: Lactic Acid, Venous: 1.5 mmol/L (ref 0.5–1.9)

## 2022-01-17 LAB — TROPONIN I (HIGH SENSITIVITY)
Troponin I (High Sensitivity): 9 ng/L (ref <18)
Troponin I (High Sensitivity): 11 ng/L (ref <18)

## 2022-01-17 LAB — BRAIN NATRIURETIC PEPTIDE: B Natriuretic Peptide: 192.2 pg/mL — ABNORMAL HIGH (ref 0.0–100.0)

## 2022-01-17 MED ORDER — IOHEXOL 300 MG/ML  SOLN
80.0000 mL | Freq: Once | INTRAMUSCULAR | Status: AC | PRN
  Administered 2022-01-17: 80 mL via INTRAVENOUS

## 2022-01-17 NOTE — ED Triage Notes (Addendum)
Pt sent from [facility].  Has had increasing shob and was sent for CT on 5/31.  Was at the office today to receive results which showed significant pleural effusion on the left.  Pt not normally on O2.  Today requiring 6L.  Room air sats 82%  No lung sounds on the left.  18G LAC

## 2022-01-17 NOTE — Consult Note (Signed)
NAME:  Pedro Revoir., MRN:  [id number], DOB:  [birth year], LOS: 0 ADMISSION DATE:  01/17/2022, CONSULTATION DATE: January 17, 2022 REFERRING MD: Dr. Sherry Ruffing, CHIEF COMPLAINT: Dyspnea  History of Present Illness:  This is a very pleasant 81 year old male who has been followed by [facility] pulmonary for the last several weeks in the setting of a moderate to large left-sided pleural effusion.  The patient is a prior smoker who was diagnosed with pneumonia in late March and treated with antibiotics.  He had progressive dyspnea after this and by April 23 he was noted to have chest pain, coughing, and was treated for pneumonia.  He had persistent pleuritic pain and little resolution of symptoms so he had a CT chest which showed lymphadenopathy and effusion.  He was noted to be on Plavix which was held and plans were made for a thoracentesis which was performed on May 16.  1800 cc of amber appearing fluid was removed.  Postoperative imaging showed persistent opacification of the left lung and a small pleural effusion.  He has followed with pulmonary since the procedure.  He was treated with a course of Levaquin and doxycycline on separate occasions.  He continues to have some degree of dyspnea but it has been steadily worsening ever since the thoracentesis.  He now is unable to take several steps.  For the last 4 to 5 days he is also had increasing swelling and pain in his left flank.  He presented to the pulmonary office today was noted to be hypoxemic and was started on oxygen and sent to the emergency room for further evaluation. Pertinent  Medical History  Coronary artery disease> status post LAD stent, on Plavix Former cigarette smoker, smoked 1-1/2 packs of cigarettes daily up until 1994, Prostate cancer Prediabetes Paroxysmal A-fib Fatty liver   Significant Hospital Events: Including procedures, antibiotic start and stop dates in addition to other pertinent events   June 2 admission  Interim History  / Subjective:  As above  Objective   Blood pressure 93/60, pulse 72, temperature (!) 97.2 F (36.2 C), temperature source Temporal, resp. rate 19, SpO2 96 %.       No intake or output data in the 24 hours ending 01/17/22 1900 There were no vitals filed for this visit.  Examination:  General:  Resting comfortably in bed HENT: NCAT OP clear PULM: Diminished left base B, normal effort CV: RRR, no mgr GI: BS+, soft, nontender, mass like swelling left flank tender but not red or fluctuant MSK: normal bulk and tone Neuro: awake, alert, no distress, MAEW   Resolved Hospital Problem list     Assessment & Plan:  Acute respiratory failure with hypoxemia in setting of enlarging pleural effusion Loculated pleural effusion on left, presumably parapneumonic but differential diagnosis is broad New onset flank mass/swelling Coronary artery disease on Plavix  Discussion: I am concerned that he has developed a hemothorax, as the swelling on his left flank is most likely related to slow oozing from the recent thoracentesis and the fact that he is still taking Plavix.  Given this recent complication we have to be careful with proceeding with another thoracentesis versus chest tube.  At this time I think it is best to hold Plavix for as long as he can tolerate and then perform a thoracentesis and/or chest tube.  Ideally it would be best to have him hold Plavix for 5 days (last dose was yesterday).  However, if his symptoms worsen then we will need to  proceed with a high risk thoracentesis versus chest tube.  I believe that the new onset left flank swelling is related to bleeding.  It was not present on imaging earlier in May and it just popped up over the last 4-5 days.  Plan: At this time I do not think there is any role for further chest imaging Hold Plavix No sub-q heparin or chemical dvt prophylaxis Administer oxygen to maintain O2 saturation greater than 88% We will plan for thoracentesis  versus chest tube over the next several days based on how his symptoms progress Warm compresses to left flank swelling  Pulmonary and critical care will follow  Best Practice (right click and "Reselect all SmartList Selections" daily)   Per [facility]  Labs   CBC: Recent Labs  Lab 01/17/22 1617  WBC 11.7*  NEUTROABS 8.7*  HGB 14.1  HCT 42.4  MCV 90.0  PLT 326    Basic Metabolic Panel: Recent Labs  Lab 01/17/22 1722  NA 140  K 4.7  CL 111  CO2 22  GLUCOSE 95  BUN 19  CREATININE 0.82  CALCIUM 8.1*   GFR: CrCl cannot be calculated (Unknown ideal weight.). Recent Labs  Lab 01/17/22 1617 01/17/22 1636  WBC 11.7*  --   LATICACIDVEN  --  1.5    Liver Function Tests: Recent Labs  Lab 01/17/22 1722  AST 36  ALT 16  ALKPHOS 61  BILITOT 1.2  PROT 4.9*  ALBUMIN 2.1*   No results for input(s): LIPASE, AMYLASE in the last 168 hours. No results for input(s): AMMONIA in the last 168 hours.  ABG    Component Value Date/Time   TCO2 19 11/03/2014 2108     Coagulation Profile: No results for input(s): INR, PROTIME in the last 168 hours.  Cardiac Enzymes: No results for input(s): CKTOTAL, CKMB, CKMBINDEX, TROPONINI in the last 168 hours.  HbA1C: Hgb A1c MFr Bld  Date/Time Value Ref Range Status  10/31/2014 03:13 AM 6.1 (H) 4.8 - 5.6 % Final    Comment:    (NOTE)         Pre-diabetes: 5.7 - 6.4         Diabetes: >6.4         Glycemic control for adults with diabetes: <7.0     CBG: No results for input(s): GLUCAP in the last 168 hours.  Review of Systems:   Gen: Denies fever, chills, weight change, fatigue, night sweats HEENT: Denies blurred vision, double vision, hearing loss, tinnitus, sinus congestion, rhinorrhea, sore throat, neck stiffness, dysphagia PULM:per HPI CV: Denies chest pain, edema, orthopnea, paroxysmal nocturnal dyspnea, palpitations GI: Denies abdominal pain, nausea, vomiting, diarrhea, hematochezia, melena, constipation, change in bowel  habits GU: Denies dysuria, hematuria, polyuria, oliguria, urethral discharge Endocrine: Denies hot or cold intolerance, polyuria, polyphagia or appetite change Derm: Denies rash, dry skin, scaling or peeling skin change Heme: Denies easy bruising, bleeding, bleeding gums Neuro: Denies headache, numbness, weakness, slurred speech, loss of memory or consciousness   Past Medical History:  He,  has a past medical history of Anosmia, CAD (coronary artery disease), native coronary artery, Chronic headaches, 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   Surgical History:   Past Surgical History:  Procedure Laterality Date   CORONARY ANGIOPLASTY WITH STENT PLACEMENT  10/30/14   STEMI s/p DES to proximal LAD; residual 80 % proximal and 60% mid LCx diease  LEFT HEART CATHETERIZATION WITH CORONARY ANGIOGRAM N/A 10/30/2014   Procedure: LEFT HEART CATHETERIZATION WITH CORONARY ANGIOGRAM;  Surgeon: Lorretta Harp, MD;  Location: [facility] CATH LAB;  Service: Cardiovascular;  Laterality: N/A;   PERCUTANEOUS CORONARY STENT INTERVENTION (PCI-S)  10/30/2014   Procedure: PERCUTANEOUS CORONARY STENT INTERVENTION (PCI-S);  Surgeon: Lorretta Harp, MD;  Location: [facility] CATH LAB;  Service: Cardiovascular;;   PROSTATECTOMY     THORACENTESIS Left 12/31/2021   Procedure: THORACENTESIS;  Surgeon: Candee Furbish, MD;  Location: [facility] ENDOSCOPY;  Service: Pulmonary;  Laterality: Left;   TONSILLECTOMY       Social History:   reports that he quit smoking about 29 years ago. His smoking use included cigarettes. He has never used smokeless tobacco. He reports current alcohol use. He reports that he does not use drugs.   Family History:  His family history includes Cancer in an other family member; Coronary artery disease (age of onset: 22) in his brother;  Diabetes in an other family member; Gout in an other family member; Hyperlipidemia in an other family member; Hypertension in an other family member.   Allergies Allergies  Allergen Reactions   Penicillins Anaphylaxis   Corn Syrup [Glucose] Other (See Comments)    High fructose corn syrup causes gout   Aspirin Adult Low [Aspirin] Itching and Rash    Dr. [name] suspects that long term caused a rash, short term use appears to be ok     Home Medications  Prior to Admission medications   Medication Sig Start Date End Date Taking? Authorizing Provider  acetaminophen (TYLENOL) 500 MG tablet Take 500 mg by mouth every 6 (six) hours as needed for mild pain or moderate pain.    [provider]  albuterol (VENTOLIN HFA) 108 (90 Base) MCG/ACT inhaler Inhale 2 puffs into the lungs every 4 (four) hours as needed for shortness of breath. 12/23/21   [provider]  ascorbic acid (VITAMIN C) 500 MG tablet Take 500 mg by mouth daily. 11/01/14   [provider]  atorvastatin (LIPITOR) 40 MG tablet TAKE 1 TABLET BY MOUTH EVERY DAY AT 6PM 07/03/21   Lorretta Harp, MD  Blood Pressure Monitor KIT For daily blood pressure monitoring;  Arm Cuff 06/05/15   Lorretta Harp, MD  carvedilol (COREG) 3.125 MG tablet TAKE 1 TABLET BY MOUTH TWICE A DAY WITH MEALS Patient taking differently: Take 3.125 mg by mouth daily. 07/27/20   Lorretta Harp, MD  Cholecalciferol 25 MCG (1000 UT) tablet Take 1,000 Units by mouth daily.    [provider]  clopidogrel (PLAVIX) 75 MG tablet TAKE 1 TABLET BY MOUTH EVERY DAY 12/23/21   Lorretta Harp, MD  colchicine 0.6 MG tablet Take 1 tablet (0.6 mg total) by mouth daily. Patient taking differently: Take 0.6 mg by mouth daily as needed (Gout). 11/06/14   Eileen Stanford, PA-C  guaiFENesin (MUCINEX) 600 MG 12 hr tablet Take 600 mg by mouth 2 (two) times daily.    [provider]  levofloxacin (LEVAQUIN) 500 MG tablet Take 500 mg by  mouth daily. 12/23/21   [provider]  nitroGLYCERIN (NITROSTAT) 0.4 MG SL tablet Place 1 tablet (0.4 mg total) under the tongue every 5 (five) minutes as needed for chest pain. 06/28/21   Lorretta Harp, MD  TRELEGY ELLIPTA 100-62.5-25 MCG/ACT AEPB Inhale 1 puff into the lungs daily. Patient not taking: Reported on 01/17/2022 12/23/21   [provider]     Critical care  time: n/a    Roselie Awkward, MD [place] [facility] Pager: [phone] Cell: [phone] After 7:00 pm call Elink  [phone]

## 2022-01-17 NOTE — H&P (Signed)
History and Physical    Patient: Pedro Pope. MLY:[id number] DOB: [birth year] DOA: 01/17/2022 DOS: the patient was seen and examined on 01/18/2022 PCP: Shon Baton, MD  Patient coming from: Home  Chief Complaint:  Chief Complaint  Patient presents with   Shortness of Breath   HPI: Pedro Pope. is a 81 y.o. male with medical history significant of prostate cancer s/p RRP and salave EBRT, CAD s/p DES to LAD in 2016, recent parapneumonic effusion requiring thoracentesis who presents with worsening shortness of breath.  Starting in April, patient was treated with multiple courses of antibiotics for pneumonia but continued to have respiratory symptoms and recurrent pleuritic chest pain.  He then had CT imaging on 5/8 showing moderate left pleural effusion that appears to be loculated.  He was followed by pulmonology and had thoracentesis outpatient on 5/16 with 1800 cc of amber appearing fluid removed.  Postoperative imaging showed persistent opacification of the left lung and a small pleural effusion.  He continues to have dyspnea that has worsened since thoracentesis.  Symptoms worse with ambulation.  Has felt weak with decreased appetite.  He has also noticed increasing swelling and pain to his left flank for the past 4 to 5 days.  He was seen in pulmonology office today with hypoxemia and started on oxygen and sent to ED for further eval.  In the ED, he was afebrile and requiring 6 L via nasal cannula.  Mild leukocytosis of 11.7 K.  CMP largely unremarkable.  CT abdomen/pelvis showed large left pleural effusion and peripheral pleural nodules on the left side suggesting malignant neoplastic process such as mesothelioma or pleural metastatic disease.  There is 13 mm lesion in the right lobe of the liver not fully evaluated.  Suggest hemangioma or metastatic disease.  He was evaluated by pulmonology who suspect recurrent pleural effusion likely hemothorax given he was taking Plavix.  They  recommend holding Plavix for 5 days with plans for thoracentesis versus chest tube depending on his symptoms. Hospitalist on-call for admission.  Review of Systems: As mentioned in the history of present illness. All other systems reviewed and are negative. Past Medical History:  Diagnosis Date   Anosmia    CAD (coronary artery disease), native coronary artery    a. 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 placed on colchicine    ED (erectile dysfunction)    Elevated TSH    Fatty liver    Gout    Ischemic cardiomyopathy    a. 2D ECHO 11/04/14 w/ EF 40-45% with LV WMA, G2DD, mild MR, mild LA dilation, mod TR.   PAF (paroxysmal atrial fibrillation) ([place])    a. Dx on 11/03/14 admission. Not placed on longterm AC due to DAPT w/ receent DES. Will plan for outpt heart monitor    Pre-diabetes    a. HgA1c 6.1 in 10/2014   Prostate cancer [facility])    s/p prostatectomy and XRT   Rheumatic fever 1947   Past Surgical History:  Procedure Laterality Date   CORONARY ANGIOPLASTY WITH STENT PLACEMENT  10/30/14   STEMI s/p DES to proximal LAD; residual 80 % proximal and 60% mid LCx diease   LEFT HEART CATHETERIZATION WITH CORONARY ANGIOGRAM N/A 10/30/2014   Procedure: LEFT HEART CATHETERIZATION WITH CORONARY ANGIOGRAM;  Surgeon: Lorretta Harp, MD;  Location: [facility] CATH LAB;  Service: Cardiovascular;  Laterality: N/A;  PERCUTANEOUS CORONARY STENT INTERVENTION (PCI-S)  10/30/2014   Procedure: PERCUTANEOUS CORONARY STENT INTERVENTION (PCI-S);  Surgeon: Lorretta Harp, MD;  Location: [facility] CATH LAB;  Service: Cardiovascular;;   PROSTATECTOMY     THORACENTESIS Left 12/31/2021   Procedure: THORACENTESIS;  Surgeon: Candee Furbish, MD;  Location: [facility] ENDOSCOPY;  Service: Pulmonary;  Laterality: Left;   TONSILLECTOMY     Social History:  reports that he quit smoking about 29 years ago. His smoking use included  cigarettes. He has never used smokeless tobacco. He reports that he does not currently use alcohol. He reports that he does not use drugs.  Allergies  Allergen Reactions   Penicillins Anaphylaxis   Corn Syrup [Glucose] Other (See Comments)    High fructose corn syrup causes gout   Aspirin Adult Low [Aspirin] Itching and Rash    Dr. [name] suspects that long term caused a rash, short term use appears to be ok    Family History  Problem Relation Age of Onset   Cancer Other    Coronary artery disease Brother 41       s/p CABG   Diabetes Other    Hyperlipidemia Other    Hypertension Other    Gout Other     Prior to Admission medications   Medication Sig Start Date End Date Taking? Authorizing Provider  acetaminophen (TYLENOL) 500 MG tablet Take 500 mg by mouth every 6 (six) hours as needed for mild pain or moderate pain.   Yes [provider]  albuterol (VENTOLIN HFA) 108 (90 Base) MCG/ACT inhaler Inhale 2 puffs into the lungs every 4 (four) hours as needed for shortness of breath. 12/23/21  Yes [provider]  ascorbic acid (VITAMIN C) 500 MG tablet Take 500 mg by mouth daily. 11/01/14  Yes [provider]  atorvastatin (LIPITOR) 40 MG tablet TAKE 1 TABLET BY MOUTH EVERY DAY AT 6PM Patient taking differently: Take 40 mg by mouth every evening. 07/03/21  Yes Lorretta Harp, MD  carvedilol (COREG) 3.125 MG tablet TAKE 1 TABLET BY MOUTH TWICE A DAY WITH MEALS Patient taking differently: Take 3.125 mg by mouth daily. 07/27/20  Yes Lorretta Harp, MD  Cholecalciferol (VITAMIN D3) 25 MCG (1000 UT) CAPS Take 1 capsule by mouth daily.   Yes [provider]  clopidogrel (PLAVIX) 75 MG tablet TAKE 1 TABLET BY MOUTH EVERY DAY Patient taking differently: Take 75 mg by mouth daily. 12/23/21  Yes Lorretta Harp, MD  colchicine 0.6 MG tablet Take 1 tablet (0.6 mg total) by mouth daily. Patient taking differently: Take 0.6 mg by mouth daily as needed (Gout).  11/06/14  Yes Eileen Stanford, PA-C  nitroGLYCERIN (NITROSTAT) 0.4 MG SL tablet Place 1 tablet (0.4 mg total) under the tongue every 5 (five) minutes as needed for chest pain. 06/28/21  Yes Lorretta Harp, MD  Blood Pressure Monitor KIT For daily blood pressure monitoring;  Arm Cuff 06/05/15   Lorretta Harp, MD  levofloxacin (LEVAQUIN) 500 MG tablet Take 500 mg by mouth daily. Patient not taking: Reported on 01/17/2022 12/23/21   [provider]    Physical Exam: Vitals:   01/17/22 1900 01/17/22 2045 01/17/22 2047 01/17/22 2337  BP: 106/69   123/72  Pulse: 72 76 77 77  Resp: 17 (!) 24 (!) 25 19  Temp:    (!) 97.5 F (36.4 C)  TempSrc:    Oral  SpO2: 96% 95% 96% 94%  Weight:    62 kg  Height:    _0  (1.727 m)   Constitutional: NAD, calm, comfortable, thin elderly male laying flat in bed Eyes: PERRL, lids and conjunctivae normal ENMT: Mucous membranes are moist. Neck: normal, supple Respiratory: Diminished lung sounds throughout with no wheezing, no crackles. Normal respiratory effort on 6 L. No accessory muscle use.  Cardiovascular: Regular rate and rhythm, no murmurs / rubs / gallops. No extremity edema. 2+ pedal pulses. No carotid bruits.  Abdomen: Soft, nondistended with left lower quadrant abdominal swelling and mild tenderness. Bowel sounds positive.  Musculoskeletal: no clubbing / cyanosis. No joint deformity upper and lower extremities.  Normal muscle tone.  Skin: no rashes, lesions, ulcers. No induration Neurologic: CN 2-12 grossly intact. Strength 5/5 in all 4.  Psychiatric: Normal judgment and insight. Alert and oriented x 3. Normal mood. Data Reviewed:  See HPI  Assessment and Plan: * Acute respiratory failure with hypoxia ([place]) Hemothorax Secondary to large left-sided pleural effusion suspected to be hemothorax from recent thoracentesis on 5/16 and being on Plavix for CAD.  There is also concerns of possible malignant neoplasm process. -Admitted on  6 L. -Continue to monitor her to maintain O2 saturation greater than 80% --pulmonology has evaluated and they recommend holding Plavix for 5 days with plans for thoracentesis versus chest tube depending on his symptoms. -Last dose of Plavix on 6/1   Left lower quadrant abdominal swelling Possible small hematoma.  No significant findings seen on CT abdomen/pelvis  History of prostate cancer s/p RRP and salave EBRT. Followed by oncology at [facility]. -has chronic hematuria suspect that to be due to radiation cystitis  CAD S/P percutaneous coronary angioplasty S/p DES to LAD in 2016 on Plavix  -hold Plavix due to concerns of hemothorax from recent thoracentesis -continue Coreg and statin      Advance Care Planning:   Code Status: Full Code   Consults: Pulmonology  Family Communication: Discussed with wife and son at bedside  Severity of Illness: The appropriate patient status for this patient is INPATIENT. Inpatient status is judged to be reasonable and necessary in order to provide the required intensity of service to ensure the patient's safety. The patient's presenting symptoms, physical exam findings, and initial radiographic and laboratory data in the context of their chronic comorbidities is felt to place them at high risk for further clinical deterioration. Furthermore, it is not anticipated that the patient will be medically stable for discharge from the hospital within 2 midnights of admission.   * I certify that at the point of admission it is my clinical judgment that the patient will require inpatient hospital care spanning beyond 2 midnights from the point of admission due to high intensity of service, high risk for further deterioration and high frequency of surveillance required.*  Author: Orene Desanctis, DO 01/18/2022 1:39 AM  For on call review [email or web address].

## 2022-01-17 NOTE — Telephone Encounter (Signed)
On [place] NP schedule today

## 2022-01-17 NOTE — Assessment & Plan Note (Signed)
Recurrent effusion after thoracentesis on 5/16; significantly increased when compared to previous imaging. Concern for underlying malignancy. Pt presented hypoxic with sats in the 80's and increased work of breathing. He required 6 lpm to recover to 90-91%. Discussed that he is not safe for outpatient treatment given his new oxygen requirement. Advised he needs to be hospitalized for further management/workup. Sent to [facility] ED via EMS. Charge RN, Interior and spatial designer, and PCCM notified of incoming patient.

## 2022-01-17 NOTE — Progress Notes (Signed)
@Patient  ID: Pedro Needle., Pedro Pope    DOB: 1940/11/26, 81 y.o.   MRN: [id number]  Chief Complaint  Patient presents with   Acute Visit    Pateint states that he just had a CT scan done on 5/31/ Pt states he is not feeling well at all. Cant sleep, not eating much, no energy levels noted, feet are swollen and states they are blue, and he states oxygen is dropping. Pt states he has been feeling like this for 3 weeks now. Fluid was removed off lung a few weeks ago. Bloody stools noted as well x2. Haven't taken trelegy in a few days d/t it makes him cough. Pain on both sides and lower back.     Referring provider: Shon Baton, MD  HPI: [name], former smoker followed for recurrent left-sided pleural effusion.  He was initially seen 12/08/2021 in the ED for increased shortness of breath, coughing and left upper chest pain.  CXR was obtained which showed opacification of the mid and left upper lobe suggesting pneumonia, chronic interstitial thickening.  He was treated with azithromycin and cefdinir.  He then had ongoing pleuritic pain and minimal improvement in his symptoms.  Underwent CT chest which showed mediastinal and hilar LAD and a moderate size left pleural effusion.  There was also a persistent left upper lobe airspace consolidation with mucoid impaction and some nodular areas.  He was prescribed Levaquin course and referred to pulmonary.  He then saw Dr. Silas Flood 12/25/2021.  Overall he was feeling some better but continued to have a productive cough and pleuritic chest pain.  Recommended thoracentesis for further management.  He underwent thoracentesis with Dr. Tamala Julian on 5/16.  1800 cc of amber appearing fluid was drained and cytology negative for malignant cells.  He had a postop chest x-ray which showed worsening of the left midlung airspace consolidation and residual small pleural effusion.  Patient contacted the office on 5/19 and reported worsening pain in his left upper chest.   Spoke with Dr. Silas Flood who thought that this could be some pleurisy from procedure and persistent consolidation, possibly unresolved pneumonia.  He was recommended to start naproxen and treated with 10-day course of Doxy.  CT scan was moved up from 6/14 to 5/31.  There was some concern for underlying malignancy given lack of improvement.  TEST/EVENTS:  01/15/2022 CT chest: Interval increase in volume of a left pleural effusion, now large with extensive associated pleural thickening and nodularity throughout the left hemithorax.  There is also essentially complete consolidation/atelectasis of the left upper lobe.  Proximal obstruction of the left upper lobe bronchus as well.  There is interval enlargement of pretracheal, AP window and left hilar lymph nodes.  All of these findings are highly suspicious for nodal metastatic disease.  Right lung has mild pulmonary fibrosis with irregular peripheral interstitial opacity, septal thickening and areas of subpleural bronchiolectasis at the right lung base, considered probable UIP.  There is also underlying emphysematous changes.  01/17/2022: Today-acute Patient presents today with wife and son for increased shortness of breath and worsening rib and back pain.  He had a CT scan on 5/31; call report received today with significant increase in left-sided pleural effusion and essentially complete consolidation or atelectasis of the left upper lobe with proximal obstruction of the left upper lobe bronchus.  He reports that ever since his last thoracentesis he has never gotten quite back to his baseline.  Thought that he may have some pleurisy on the left side and  was treated with naproxen course he also had a persistent consolidation so he was provided with a 10-day course of Doxy.  He has completed both of these with minimal improvement in his symptoms.  He has also had recent loss of appetite and has just felt overall weak. Denies any hemoptysis, weight loss, night  sweats, fevers.   Allergies  Allergen Reactions   Penicillins Anaphylaxis   Corn Syrup [Glucose] Other (See Comments)    High fructose corn syrup causes gout   Aspirin Adult Low [Aspirin] Itching and Rash    Dr. [name] suspects that long term caused a rash, short term use appears to be ok    Immunization History  Administered Date(s) Administered   Influenza Split 05/20/2012, 09/01/2013   Influenza, High Dose Seasonal PF 04/23/2017   Influenza, Quadrivalent, Recombinant, Inj, Pf 04/08/2018, 07/18/2020   Influenza,inj,Quad PF,6+ Mos 11/01/2014   PFIZER Comirnaty(Gray Top)Covid-19 Tri-Sucrose Vaccine 12/16/2019, 05/18/2020   PFIZER(Purple Top)SARS-COV-2 Vaccination 10/14/2019, 11/08/2019   Pneumococcal Conjugate-13 09/01/2013, 10/01/2017   Pneumococcal Polysaccharide-23 11/01/2014   Zoster, Live 09/01/2013    Past Medical History:  Diagnosis Date   Anosmia    CAD (coronary artery disease), native coronary artery    a. 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 placed on colchicine    ED (erectile dysfunction)    Elevated TSH    Fatty liver    Gout    Ischemic cardiomyopathy    a. 2D ECHO 11/04/14 w/ EF 40-45% with LV WMA, G2DD, mild MR, mild LA dilation, mod TR.   PAF (paroxysmal atrial fibrillation) ([place])    a. Dx on 11/03/14 admission. Not placed on longterm AC due to DAPT w/ receent DES. Will plan for outpt heart monitor    Pre-diabetes    a. HgA1c 6.1 in 10/2014   Prostate cancer [facility])    s/p prostatectomy and XRT   Rheumatic fever 1947    Tobacco History: Social History   Tobacco Use  Smoking Status Former   Types: Cigarettes   Quit date: 08/18/1992   Years since quitting: 29.4  Smokeless Tobacco Never   Counseling given: Not Answered   Outpatient Medications Prior to Visit  Medication Sig Dispense Refill   acetaminophen (TYLENOL) 500 MG tablet Take 500 mg by  mouth every 6 (six) hours as needed for mild pain or moderate pain.     albuterol (VENTOLIN HFA) 108 (90 Base) MCG/ACT inhaler Inhale 2 puffs into the lungs every 4 (four) hours as needed for shortness of breath.     ascorbic acid (VITAMIN C) 500 MG tablet Take 500 mg by mouth daily.     atorvastatin (LIPITOR) 40 MG tablet TAKE 1 TABLET BY MOUTH EVERY DAY AT 6PM 90 tablet 1   Blood Pressure Monitor KIT For daily blood pressure monitoring;  Arm Cuff 1 each 0   carvedilol (COREG) 3.125 MG tablet TAKE 1 TABLET BY MOUTH TWICE A DAY WITH MEALS (Patient taking differently: Take 3.125 mg by mouth daily.) 180 tablet 3   Cholecalciferol 25 MCG (1000 UT) tablet Take 1,000 Units by mouth daily.     clopidogrel (PLAVIX) 75 MG tablet TAKE 1 TABLET BY MOUTH EVERY DAY 90 tablet 1   colchicine 0.6 MG tablet Take 1 tablet (0.6 mg total) by mouth daily. (Patient taking differently: Take 0.6 mg by mouth daily as needed (Gout).) 30 tablet  3   guaiFENesin (MUCINEX) 600 MG 12 hr tablet Take 600 mg by mouth 2 (two) times daily.     levofloxacin (LEVAQUIN) 500 MG tablet Take 500 mg by mouth daily.     nitroGLYCERIN (NITROSTAT) 0.4 MG SL tablet Place 1 tablet (0.4 mg total) under the tongue every 5 (five) minutes as needed for chest pain. 25 tablet 2   TRELEGY ELLIPTA 100-62.5-25 MCG/ACT AEPB Inhale 1 puff into the lungs daily. (Patient not taking: Reported on 01/17/2022)     No facility-administered medications prior to visit.     Review of Systems: As listed above otherwise negative    Physical Exam:  BP 126/64 (BP Location: Left Arm, Patient Position: Sitting, Cuff Size: Normal)   Pulse 83   SpO2 90% Comment: 6L  GEN: Pleasant, interactive, acute on chronically ill appearing; in mild respiratory distress. HEENT:  Normocephalic and atraumatic. PERRLA. Sclera white.  NECK:  Supple w/ fair ROM. No JVD present.  CV: RRR, no m/r/g, no peripheral edema. Pulses intact, +2 bilaterally. No cyanosis, pallor or  clubbing. PULMONARY:  Increased work of breathing. Minimal airflow left lung; diminished right lobe with basilar crackles. No accessory muscle use. No dullness to percussion. GI: BS present and normoactive. Soft, non-tender to palpation.  Neuro: A/Ox3. No focal deficits noted.   Skin: Warm, no lesions or rashe Psych: Normal affect and behavior. Judgement and thought content appropriate.     Lab Results:  CBC    Component Value Date/Time   WBC 8.9 12/08/2021 1717   RBC 4.65 12/08/2021 1717   HGB 13.9 12/08/2021 1717   HCT 43.3 12/08/2021 1717   PLT 212 12/08/2021 1717   MCV 93.1 12/08/2021 1717   MCH 29.9 12/08/2021 1717   MCHC 32.1 12/08/2021 1717   RDW 13.2 12/08/2021 1717   LYMPHSABS 1.8 04/11/2021 1252   MONOABS 0.8 04/11/2021 1252   EOSABS 0.1 04/11/2021 1252   BASOSABS 0.0 04/11/2021 1252    BMET    Component Value Date/Time   NA 139 12/08/2021 1717   K 4.1 12/08/2021 1717   CL 108 12/08/2021 1717   CO2 25 12/08/2021 1717   GLUCOSE 91 12/08/2021 1717   BUN 13 12/08/2021 1717   CREATININE 0.92 12/08/2021 1717   CALCIUM 8.7 (L) 12/08/2021 1717   GFRNONAA >60 12/08/2021 1717   GFRAA 75 (L) 11/04/2014 0415    BNP    Component Value Date/Time   BNP 192.8 (H) 12/08/2021 1717     Imaging:  CT Chest Wo Contrast  Result Date: 01/17/2022 CLINICAL DATA:  Follow-up pneumonia, parapneumonic effusion * Tracking Code: BO * EXAM: CT CHEST WITHOUT CONTRAST TECHNIQUE: Multidetector CT imaging of the chest was performed following the standard protocol without IV contrast. RADIATION DOSE REDUCTION: This exam was performed according to the departmental dose-optimization program which includes automated exposure control, adjustment of the mA and/or kV according to patient size and/or use of iterative reconstruction technique. COMPARISON:  12/23/2021 FINDINGS: Cardiovascular: Aortic atherosclerosis. Aortic valve calcifications. Normal heart size. Subendocardial fibrofatty scarring  of the left ventricular apex in keeping with prior infarction (series 2, image 117). Three-vessel coronary artery calcifications. No pericardial effusion. Mediastinum/Nodes: Interval enlargement of pretracheal, AP window, and left hilar lymph nodes, largest AP window node measuring 2.0 x 1.9 cm, previously 1.7 x 1.5 cm (series 2, image 66). Thyroid gland, trachea, and esophagus demonstrate no significant findings. Lungs/Pleura: Interval increase in volume of a left pleural effusion, now large, with extensive associated pleural thickening and nodularity  throughout the left hemithorax (series 2, image 138). There is now essentially complete consolidation and or atelectasis of the left upper lobe sans lingula, with proximal obstruction of the left upper lobe bronchus (series 5, image 77). Moderate to severe underlying centrilobular and paraseptal emphysema. Mild pulmonary fibrosis of the aerated right lung, characterized by irregular peripheral interstitial opacity, septal thickening, and areas of subpleural bronchiolectasis at the right lung base. Upper Abdomen: No acute abnormality. Nonobstructive right nephrolithiasis. Musculoskeletal: No chest wall abnormality. No suspicious osseous lesions identified. IMPRESSION: 1. Interval increase in volume of a left pleural effusion, now large, with extensive associated pleural thickening and nodularity throughout the left hemithorax. There is now essentially complete consolidation and or atelectasis of the left upper lobe sans lingula, with proximal obstruction of the left upper lobe bronchus. Findings are most consistent with underlying primary lung malignancy and associated pleural metastatic disease. 2. Interval enlargement of pretracheal, AP window, and left hilar lymph nodes, which may be reactive but are highly suspicious for nodal metastatic disease. 3. Underlying emphysema. 4. Mild pulmonary fibrosis of the aerated right lung, characterized by irregular peripheral  interstitial opacity, septal thickening, and areas of subpleural bronchiolectasis at the right lung base. Although not optimally characterized by this non tailored examination, findings are categorized as probable UIP per consensus guidelines: Diagnosis of Idiopathic Pulmonary Fibrosis: An Official ATS/ERS/JRS/ALAT Clinical Practice Guideline. [place], Iss 5, [phone], Apr 18 2017. 5. Coronary artery disease. 6. Aortic valve calcifications. Correlate for echocardiographic evidence of aortic valve dysfunction. 7. Nonobstructive right nephrolithiasis. These results will be called to the ordering clinician or representative by the Radiologist Assistant, and communication documented in the PACS or [employer]. Aortic Atherosclerosis (ICD10-I70.0) and Emphysema (ICD10-J43.9). Electronically Signed   By: Delanna Ahmadi M.D.   On: 01/17/2022 09:00   CT CHEST WO CONTRAST  Result Date: 12/23/2021 CLINICAL DATA:  Pneumonia EXAM: CT CHEST WITHOUT CONTRAST TECHNIQUE: Multidetector CT imaging of the chest was performed following the standard protocol without IV contrast. RADIATION DOSE REDUCTION: This exam was performed according to the departmental dose-optimization program which includes automated exposure control, adjustment of the mA and/or kV according to patient size and/or use of iterative reconstruction technique. COMPARISON:  Radiograph 12/08/2021 FINDINGS: Cardiovascular: Normal cardiac size.No pericardial disease.Coronary artery calcifications. There is unchanged subendocardial hypoattenuation in the left ventricle associated thinning of the ventricular wall compatible with chronic LAD territory infarct.There is moderate atherosclerosis of the thoracic aorta. Mediastinum/Nodes: There is mediastinal and hilar lymphadenopathy. For reference, left hilar lymph node measures up to 1.2 cm short axis (series 2, image 91). Left paratracheal lymph node measures to 1.0 cm short axis (series 2,  image 66).The thyroid is unremarkable.Esophagus is unremarkable.The trachea is unremarkable. Lungs/Pleura: There is a moderate size left pleural effusion. There is a persistent left upper lobe airspace consolidation with mucoid impaction and some nodular areas. There is centrilobular and paraseptal emphysema, mid apical predominant.There is subpleural reticulation and bronchiolectasis likely reflecting mild pulmonary fibrosis. No pneumothorax. Upper Abdomen: No acute abnormality. Musculoskeletal: Mild pectus deformity. No acute osseous abnormality. No suspicious lytic or blastic lesions. Multilevel degenerative changes of the spine. IMPRESSION: Severe left upper lobe pneumonia with mucoid impaction and nodular areas. Mediastinal and left hilar lymphadenopathy, likely reactive. Moderate size left pleural effusion. Recommend follow-up chest CT in 3 months after treatment to ensure resolution and rule out underlying neoplasm. Mid to apical predominant centrilobular and paraseptal emphysema. Coronary artery atherosclerosis with evidence of chronic LAD territory  infarct. Aortic Atherosclerosis (ICD10-I70.0) and Emphysema (ICD10-J43.9). Electronically Signed   By: Maurine Simmering M.D.   On: 12/23/2021 16:12   DG CHEST PORT 1 VIEW  Result Date: 12/31/2021 CLINICAL DATA:  Pneumonia, s/P thoracentesis EXAM: PORTABLE CHEST - 1 VIEW COMPARISON:  12/08/2021 FINDINGS: No pneumothorax. Progressive peripheral consolidation in the left mid lung extending to the hilum. Blunting of the left lateral costophrenic angle suggesting small effusion. Right lung clear. Heart size and mediastinal contours are within normal limits. Aortic Atherosclerosis (ICD10-170.0). Visualized bones unremarkable. IMPRESSION: 1. No pneumothorax. 2. Worsening peripheral left mid lung airspace consolidation with small effusion Electronically Signed   By: Lucrezia Europe M.D.   On: 12/31/2021 15:08          View : No data to display.          No results  found for: NITRICOXIDE      Assessment & Plan:   Pleural effusion Recurrent effusion after thoracentesis on 5/16; significantly increased when compared to previous imaging. Concern for underlying malignancy. Pt presented hypoxic with sats in the 80's and increased work of breathing. He required 6 lpm to recover to 90-91%. Discussed that he is not safe for outpatient treatment given his new oxygen requirement. Advised he needs to be hospitalized for further management/workup. Sent to [facility] ED via EMS. Charge RN, Interior and spatial designer, and PCCM notified of incoming patient.    Acute respiratory failure (HCC) Hypoxic into the 80's with new O2 requirement and mild respiratory distress.    Clayton Bibles, NP 01/17/2022  Pt aware and understands NP's role.

## 2022-01-17 NOTE — Telephone Encounter (Signed)
Spoke with Diane at [facility] imaging  Call report on CT Chest dated 01/15/22   Impression:  IMPRESSION: 1. Interval increase in volume of a left pleural effusion, now large, with extensive associated pleural thickening and nodularity throughout the left hemithorax. There is now essentially complete consolidation and or atelectasis of the left upper lobe sans lingula, with proximal obstruction of the left upper lobe bronchus. Findings are most consistent with underlying primary lung malignancy and associated pleural metastatic disease. 2. Interval enlargement of pretracheal, AP window, and left hilar lymph nodes, which may be reactive but are highly suspicious for nodal metastatic disease. 3. Underlying emphysema. 4. Mild pulmonary fibrosis of the aerated right lung, characterized by irregular peripheral interstitial opacity, septal thickening, and areas of subpleural bronchiolectasis at the right lung base. Although not optimally characterized by this non tailored examination, findings are categorized as probable UIP per consensus guidelines: Diagnosis of Idiopathic Pulmonary Fibrosis: An Official ATS/ERS/JRS/ALAT Clinical Practice Guideline. [place], Iss 5, [phone], Apr 18 2017. 5. Coronary artery disease. 6. Aortic valve calcifications. Correlate for echocardiographic evidence of aortic valve dysfunction. 7. Nonobstructive right nephrolithiasis.   These results will be called to the ordering clinician or representative by the Radiologist Assistant, and communication documented in the PACS or [employer].   Aortic Atherosclerosis (ICD10-I70.0) and Emphysema (ICD10-J43.9).     Electronically Signed   By: Delanna Ahmadi M.D.   On: 01/17/2022 09:00   Routing to Dr Lynelle Smoke P since Dr Silas Flood not available today

## 2022-01-17 NOTE — Consult Note (Incomplete)
NAME:  Pedro Santillana., MRN:  [id number], DOB:  1940-11-09, LOS: 0 ADMISSION DATE:  01/17/2022, CONSULTATION DATE:  6/2 REFERRING MD:  Tegeler, CHIEF COMPLAINT:  ***   History of Present Illness:  ***  Pertinent  Medical History  ***  Significant Hospital Events: Including procedures, antibiotic start and stop dates in addition to other pertinent events     Interim History / Subjective:  ***  Objective   Blood pressure 93/60, pulse 72, temperature (!) 97.2 F (36.2 C), temperature source Temporal, resp. rate 19, SpO2 96 %.       No intake or output data in the 24 hours ending 01/17/22 1858 There were no vitals filed for this visit.  Examination: General: *** HENT: *** Lungs: *** Cardiovascular: *** Abdomen: *** Extremities: *** Neuro: *** GU: ***  Resolved Hospital Problem list   ***  Assessment & Plan:  ***  Best Practice (right click and "Reselect all SmartList Selections" daily)   Diet/type: {diet type:25684} DVT prophylaxis: {anticoagulation (Optional):25687} GI prophylaxis: {GU:44034} Lines: {Central Venous Access:25771} Foley:  {Central Venous Access:25691} Code Status:  {Code Status:26939} Last date of multidisciplinary goals of care discussion [***]  Labs   CBC: Recent Labs  Lab 01/17/22 1617  WBC 11.7*  NEUTROABS 8.7*  HGB 14.1  HCT 42.4  MCV 90.0  PLT 742    Basic Metabolic Panel: Recent Labs  Lab 01/17/22 1722  NA 140  K 4.7  CL 111  CO2 22  GLUCOSE 95  BUN 19  CREATININE 0.82  CALCIUM 8.1*   GFR: CrCl cannot be calculated (Unknown ideal weight.). Recent Labs  Lab 01/17/22 1617 01/17/22 1636  WBC 11.7*  --   LATICACIDVEN  --  1.5    Liver Function Tests: Recent Labs  Lab 01/17/22 1722  AST 36  ALT 16  ALKPHOS 61  BILITOT 1.2  PROT 4.9*  ALBUMIN 2.1*   No results for input(s): LIPASE, AMYLASE in the last 168 hours. No results for input(s): AMMONIA in the last 168 hours.  ABG    Component Value  Date/Time   TCO2 19 11/03/2014 2108     Coagulation Profile: No results for input(s): INR, PROTIME in the last 168 hours.  Cardiac Enzymes: No results for input(s): CKTOTAL, CKMB, CKMBINDEX, TROPONINI in the last 168 hours.  HbA1C: Hgb A1c MFr Bld  Date/Time Value Ref Range Status  10/31/2014 03:13 AM 6.1 (H) 4.8 - 5.6 % Final    Comment:    (NOTE)         Pre-diabetes: 5.7 - 6.4         Diabetes: >6.4         Glycemic control for adults with diabetes: <7.0     CBG: No results for input(s): GLUCAP in the last 168 hours.  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   Surgical History:   Past Surgical History:  Procedure Laterality Date   CORONARY ANGIOPLASTY WITH STENT PLACEMENT  10/30/14   STEMI s/p DES to proximal LAD; residual 80 % proximal and 60% mid LCx diease   LEFT HEART CATHETERIZATION WITH CORONARY ANGIOGRAM N/A 10/30/2014   Procedure: LEFT HEART CATHETERIZATION WITH CORONARY ANGIOGRAM;  Surgeon: Lorretta Harp, MD;  Location: [place] CATH LAB;  Service: Cardiovascular;  Laterality: N/A;   PERCUTANEOUS CORONARY STENT INTERVENTION (PCI-S)  10/30/2014   Procedure: PERCUTANEOUS CORONARY STENT INTERVENTION (PCI-S);  Surgeon: Lorretta Harp, MD;  Location: [facility] CATH LAB;  Service: Cardiovascular;;   PROSTATECTOMY     THORACENTESIS Left 12/31/2021   Procedure: THORACENTESIS;  Surgeon: Candee Furbish, MD;  Location: [facility] ENDOSCOPY;  Service: Pulmonary;  Laterality: Left;   TONSILLECTOMY       Social History:   reports that he quit smoking about 29 years ago. His smoking use included cigarettes. He has never used smokeless tobacco. He  reports current alcohol use. He reports that he does not use drugs.   Family History:  His family history includes Cancer in an other family member; Coronary artery disease (age of onset: 12) in his brother; Diabetes in an other family member; Gout in an other family member; Hyperlipidemia in an other family member; Hypertension in an other family member.   Allergies Allergies  Allergen Reactions   Penicillins Anaphylaxis   Corn Syrup [Glucose] Other (See Comments)    High fructose corn syrup causes gout   Aspirin Adult Low [Aspirin] Itching and Rash    Dr. [name] suspects that long term caused a rash, short term use appears to be ok     Home Medications  Prior to Admission medications   Medication Sig Start Date End Date Taking? Authorizing Provider  acetaminophen (TYLENOL) 500 MG tablet Take 500 mg by mouth every 6 (six) hours as needed for mild pain or moderate pain.    [provider]  albuterol (VENTOLIN HFA) 108 (90 Base) MCG/ACT inhaler Inhale 2 puffs into the lungs every 4 (four) hours as needed for shortness of breath. 12/23/21   [provider]  ascorbic acid (VITAMIN C) 500 MG tablet Take 500 mg by mouth daily. 11/01/14   [provider]  atorvastatin (LIPITOR) 40 MG tablet TAKE 1 TABLET BY MOUTH EVERY DAY AT 6PM 07/03/21   Lorretta Harp, MD  Blood Pressure Monitor KIT For daily blood pressure monitoring;  Arm Cuff 06/05/15   Lorretta Harp, MD  carvedilol (COREG) 3.125 MG tablet TAKE 1 TABLET BY MOUTH TWICE A DAY WITH MEALS Patient taking differently: Take 3.125 mg by mouth daily. 07/27/20   Lorretta Harp, MD  Cholecalciferol 25 MCG (1000 UT) tablet Take 1,000 Units by mouth daily.    [provider]  clopidogrel (PLAVIX) 75 MG tablet TAKE 1 TABLET BY MOUTH EVERY DAY 12/23/21   Lorretta Harp, MD  colchicine 0.6 MG tablet Take 1 tablet (0.6 mg total) by mouth daily. Patient taking differently: Take 0.6 mg by mouth daily as needed  (Gout). 11/06/14   Eileen Stanford, PA-C  guaiFENesin (MUCINEX) 600 MG 12 hr tablet Take 600 mg by mouth 2 (two) times daily.    [provider]  levofloxacin (LEVAQUIN) 500 MG tablet Take 500 mg by mouth daily. 12/23/21   [provider]  nitroGLYCERIN (NITROSTAT) 0.4 MG SL tablet Place 1 tablet (0.4 mg total) under the tongue every 5 (five) minutes as needed for chest pain. 06/28/21   Lorretta Harp, MD  TRELEGY ELLIPTA 100-62.5-25 MCG/ACT AEPB Inhale 1 puff into the lungs daily. Patient not taking: Reported on 01/17/2022 12/23/21   [provider]     Critical care time: ***

## 2022-01-17 NOTE — ED Provider Notes (Signed)
[facility] EMERGENCY DEPARTMENT Provider Note   CSN: [id number] Arrival date & time: 01/17/22  1542     History  Chief Complaint  Patient presents with   Shortness of Breath    Pedro Pope. is a 81 y.o. male.  The history is provided by the patient, medical records and a relative. No language interpreter was used.  Shortness of Breath Severity:  Severe Onset quality:  Gradual Duration:  3 days Timing:  Constant Progression:  Waxing and waning Chronicity:  Recurrent Context: not URI   Relieved by:  Nothing Worsened by:  Deep breathing and coughing Ineffective treatments:  None tried Associated symptoms: abdominal pain, cough and sputum production   Associated symptoms: no chest pain, no claudication, no diaphoresis, no fever, no headaches, no neck pain, no rash, no syncope, no vomiting and no wheezing       Home Medications Prior to Admission medications   Medication Sig Start Date End Date Taking? Authorizing Provider  acetaminophen (TYLENOL) 500 MG tablet Take 500 mg by mouth every 6 (six) hours as needed for mild pain or moderate pain.    [provider]  albuterol (VENTOLIN HFA) 108 (90 Base) MCG/ACT inhaler Inhale 2 puffs into the lungs every 4 (four) hours as needed for shortness of breath. 12/23/21   [provider]  ascorbic acid (VITAMIN C) 500 MG tablet Take 500 mg by mouth daily. 11/01/14   [provider]  atorvastatin (LIPITOR) 40 MG tablet TAKE 1 TABLET BY MOUTH EVERY DAY AT 6PM 07/03/21   Lorretta Harp, MD  Blood Pressure Monitor KIT For daily blood pressure monitoring;  Arm Cuff 06/05/15   Lorretta Harp, MD  carvedilol (COREG) 3.125 MG tablet TAKE 1 TABLET BY MOUTH TWICE A DAY WITH MEALS Patient taking differently: Take 3.125 mg by mouth daily. 07/27/20   Lorretta Harp, MD  Cholecalciferol 25 MCG (1000 UT) tablet Take 1,000 Units by mouth daily.    [provider]  clopidogrel (PLAVIX) 75  MG tablet TAKE 1 TABLET BY MOUTH EVERY DAY 12/23/21   Lorretta Harp, MD  colchicine 0.6 MG tablet Take 1 tablet (0.6 mg total) by mouth daily. Patient taking differently: Take 0.6 mg by mouth daily as needed (Gout). 11/06/14   Eileen Stanford, PA-C  guaiFENesin (MUCINEX) 600 MG 12 hr tablet Take 600 mg by mouth 2 (two) times daily.    [provider]  levofloxacin (LEVAQUIN) 500 MG tablet Take 500 mg by mouth daily. 12/23/21   [provider]  nitroGLYCERIN (NITROSTAT) 0.4 MG SL tablet Place 1 tablet (0.4 mg total) under the tongue every 5 (five) minutes as needed for chest pain. 06/28/21   Lorretta Harp, MD  TRELEGY ELLIPTA 100-62.5-25 MCG/ACT AEPB Inhale 1 puff into the lungs daily. Patient not taking: Reported on 01/17/2022 12/23/21   [provider]      Allergies    Penicillins, Corn syrup [glucose], and Aspirin adult low [aspirin]    Review of Systems   Review of Systems  Constitutional:  Positive for fatigue. Negative for chills, diaphoresis and fever.  HENT:  Negative for congestion.   Respiratory:  Positive for cough, sputum production, chest tightness and shortness of breath. Negative for wheezing and stridor.   Cardiovascular:  Negative for chest pain, palpitations, claudication, leg swelling and syncope.  Gastrointestinal:  Positive for abdominal pain and blood in stool. Negative for constipation, diarrhea, nausea and vomiting.  Genitourinary:  Positive for flank  pain. Negative for dysuria and frequency.  Musculoskeletal:  Positive for back pain (L flank). Negative for neck pain and neck stiffness.  Skin:  Negative for rash and wound.  Neurological:  Negative for headaches.  Psychiatric/Behavioral:  Negative for agitation and confusion.   All other systems reviewed and are negative.  Physical Exam Updated Vital Signs BP 106/69   Pulse 77   Temp (!) 97.2 F (36.2 C) (Temporal)   Resp (!) 25   SpO2 96%  Physical Exam Vitals and nursing note  reviewed.  Constitutional:      General: He is not in acute distress.    Appearance: He is well-developed. He is ill-appearing. He is not toxic-appearing or diaphoretic.  HENT:     Head: Normocephalic and atraumatic.     Mouth/Throat:     Mouth: Mucous membranes are moist.  Eyes:     Conjunctiva/sclera: Conjunctivae normal.  Cardiovascular:     Rate and Rhythm: Normal rate and regular rhythm.     Heart sounds: No murmur heard. Pulmonary:     Effort: Pulmonary effort is normal. No respiratory distress.     Breath sounds: Examination of the left-upper field reveals decreased breath sounds. Examination of the left-middle field reveals decreased breath sounds. Examination of the left-lower field reveals decreased breath sounds. Decreased breath sounds, rhonchi and rales present.  Chest:     Chest wall: No tenderness.  Abdominal:     Palpations: Abdomen is soft.     Tenderness: There is no abdominal tenderness.  Musculoskeletal:        General: No swelling.     Cervical back: Neck supple.     Right lower leg: No tenderness. Edema present.     Left lower leg: No tenderness. Edema present.  Skin:    General: Skin is warm and dry.     Capillary Refill: Capillary refill takes less than 2 seconds.     Coloration: Skin is pale.     Findings: No erythema.  Neurological:     General: No focal deficit present.     Mental Status: He is alert.  Psychiatric:        Mood and Affect: Mood normal.    ED Results / Procedures / Treatments   Labs (all labs ordered are listed, but only abnormal results are displayed) Labs Reviewed  CBC WITH DIFFERENTIAL/PLATELET - Abnormal; Notable for the following components:      Result Value   WBC 11.7 (*)    Neutro Abs 8.7 (*)    All other components within normal limits  COMPREHENSIVE METABOLIC PANEL - Abnormal; Notable for the following components:   Calcium 8.1 (*)    Total Protein 4.9 (*)    Albumin 2.1 (*)    All other components within normal  limits  SARS CORONAVIRUS 2 BY RT PCR  URINE CULTURE  LACTIC ACID, PLASMA  URINALYSIS, ROUTINE W REFLEX MICROSCOPIC  BRAIN NATRIURETIC PEPTIDE  TROPONIN I (HIGH SENSITIVITY)  TROPONIN I (HIGH SENSITIVITY)    EKG EKG Interpretation  Date/Time:  Friday January 17 2022 18:19:09 EDT Ventricular Rate:  75 PR Interval:  201 QRS Duration: 100 QT Interval:  429 QTC Calculation: 480 R Axis:   57 Text Interpretation: Sinus rhythm Low voltage, precordial leads Borderline T wave abnormalities Borderline prolonged QT interval when compared to prior, similar appearance. No STEMI Confirmed by Antony Blackbird [phone]) on 01/17/2022 6:48:38 PM  Radiology DG Chest 2 View  Result Date: 01/17/2022 CLINICAL DATA:  Worsening hypoxia. Known  left pleural effusion recurrence EXAM: CHEST - 2 VIEW COMPARISON:  Chest CT 01/15/2022.  Radiograph 12/31/2021 FINDINGS: Large partially loculated left pleural effusion is not significantly changed from recent chest CT. Lateral component tracks towards the apex. There is underlying left lung opacities. Right lung emphysema without acute findings. Heart size is obscured by lung opacity. No pneumothorax. IMPRESSION: 1. Large partially loculated left pleural effusion, not significantly changed from CT 2 days ago. Underlying left lung opacities were better assessed on recent CT. 2. Emphysema.  No acute right lung findings. Electronically Signed   By: Keith Rake M.D.   On: 01/17/2022 17:08   CT ABDOMEN PELVIS W CONTRAST  Result Date: 01/17/2022 CLINICAL DATA:  Pain left lower quadrant of abdomen, rectal bleeding EXAM: CT ABDOMEN AND PELVIS WITH CONTRAST TECHNIQUE: Multidetector CT imaging of the abdomen and pelvis was performed using the standard protocol following bolus administration of intravenous contrast. RADIATION DOSE REDUCTION: This exam was performed according to the departmental dose-optimization program which includes automated exposure control, adjustment of the mA  and/or kV according to patient size and/or use of iterative reconstruction technique. CONTRAST:  2m OMNIPAQUE IOHEXOL 300 MG/ML  SOLN COMPARISON:  Previous CT chest done on 01/15/2022, chest radiographs done earlier today FINDINGS: Lower chest: Large left pleural effusion is noted. There are peripheral pleural nodules on the left side. There is almost complete atelectasis in the visualized left lower lung fields. There is increase in interstitial markings in the periphery of right lower lung fields possibly suggesting interstitial lung disease. There are scattered coronary artery calcifications. Dense calcification is seen in the region of mitral annulus. Hepatobiliary: In the image 14 of series 4, there is 1.3 cm low-density lesion in the right lobe of liver. There is no significant dilation of bile ducts. Gallbladder is not distended. Pancreas: No focal abnormality is seen. Spleen: There is medial displacement of spleen caused by large left pleural effusion. Adrenals/Urinary Tract: Adrenals are unremarkable. There is no hydronephrosis. Left kidney is displaced medially. There is 7 mm calculus in the midportion of right kidney. Ureters are not dilated. Urinary bladder is unremarkable. Stomach/Bowel: Stomach is not distended. Small bowel loops are not dilated. Appendix is not dilated. Appendix is noted in the right pelvic cavity. There is no significant wall thickening in colon. There is no pericolic stranding. Vascular/Lymphatic: Extensive calcifications are seen in the abdominal aorta and its major branches. Some of the coarse calcifications are noted projecting into the lumen of abdominal aorta. Reproductive: There is evidence of previous surgical removal of prostate. There are multiple surgical clips in both sides of pelvis. Other: There is no ascites or pneumoperitoneum. Umbilical hernia containing fat is seen. Musculoskeletal: Degenerative changes are noted in both hips, more so on the right side. No  definite focal lytic or sclerotic lesions are seen. IMPRESSION: Massive left pleural effusion. There are numerous nodules in the periphery of left pleural space suggesting malignant neoplastic process such as mesothelioma or pleural metastatic disease. There is compression atelectasis of visualized left lower lung fields. There is prominence of interstitial markings in the periphery of right lower lung fields suggesting scarring from interstitial lung disease. There is no evidence of intestinal obstruction or pneumoperitoneum. Appendix is not dilated. There is no hydronephrosis. There is 7 mm nonobstructing right renal stone. There is 13 mm low-density lesion in the right lobe of liver which is not fully evaluated. This may suggest hemangioma or malignant neoplastic process such as metastatic disease. Other findings as described in the  body of the report. Electronically Signed   By: Elmer Picker M.D.   On: 01/17/2022 20:06    Procedures Procedures    CRITICAL CARE Performed by: Gwenyth Allegra Keishawn Rajewski Total critical care time: 35 minutes Critical care time was exclusive of separately billable procedures and treating other patients. Critical care was necessary to treat or prevent imminent or life-threatening deterioration. Critical care was time spent personally by me on the following activities: development of treatment plan with patient and/or surrogate as well as nursing, discussions with consultants, evaluation of patient's response to treatment, examination of patient, obtaining history from patient or surrogate, ordering and performing treatments and interventions, ordering and review of laboratory studies, ordering and review of radiographic studies, pulse oximetry and re-evaluation of patient's condition.   Medications Ordered in ED Medications  iohexol (OMNIPAQUE) 300 MG/ML solution 80 mL (80 mLs Intravenous Contrast Given 01/17/22 1922)    ED Course/ Medical Decision Making/ A&P                            Medical Decision Making Amount and/or Complexity of Data Reviewed Labs: ordered. Radiology: ordered.  Risk Prescription drug management. Decision regarding hospitalization.    Kasey Ewings. is a 81 y.o. male with a past medical history significant for CAD status post PCI, hyperlipidemia, paroxysmal atrial fibrillation, ischemic cardiomyopathy, Dressler syndrome, recent pleural effusion status post thoracentesis several weeks ago, fatty liver, previous prostate cancer status post prostatectomy, who presents at the direction of pulmonology for evaluation and admission for worsened hypoxia in the setting of recurrent pleural effusion and new left lower quadrant abdominal pain with intermittent rectal bleeding.  According to patient, he had fluid taken off his lung several weeks ago and was doing better.  He says that over the last few days he has had shortness of breath worsening and some slightly productive cough.  He denies hemoptysis.  Denies any chest pain or palpitations but had a CT scan done 2 days ago that showed recurrent large left-sided pleural effusion and he was found to be hypoxic today.  Patient was requiring 6 L as his oxygen saturation on room air was 82%.  On my initial evaluation he is now on 7 L nasal cannula to maintain oxygen saturations in the 90s.  He says that for the last few days he has also had pain in his left lower quadrant/left flank and has had bright bloody stools.  He does think he has a history of diverticulitis in the past.  Otherwise denies fevers, chills, constipation, or diarrhea.  Denies any urinary changes or history of kidney stones.  Denies any rashes.  Does report some mild peripheral edema that he has had in the past.  Patient was sent for admission and likely thoracentesis during admission.  On exam, patient does have diminished breath sounds bilaterally left worse than right with some rales and rhonchi.  Chest and back were  nontender.  Patient does have tenderness in his left lower quadrant that is moderate.  He did not have rash to suggest shingles.  Bowel sounds were appreciated.  Legs are slightly edematous and are pale.  Patient otherwise maintaining oxygen saturations on the 7 L nasal cannula that when he does not take oxygen at home normally.  He is afebrile.  He is not hypotensive or tachycardic.  EKG does not show STEMI.  Clinically I suspect that patient has a combination of problems tonight.  I do feel  that given his hypoxia and the pleural effusion, I anticipate he will need admission for pleurocentesis with pulmonology however, with his new left lower quadrant abdominal pain, rectal bleeding I want to make sure he does not have concurrent diverticulitis and thus would need antibiotics.  We will also get a type and screen as he appears pale and has had the reported rectal bleeding.  Does not report any significant rectal pain.  When not pushing on his abdomen, he is not having significant pain, will hold on medications initially.  He is not reporting nausea or vomiting.  Anticipate when work-up is completed, we will call for admission.  As he just had a CT scan 2 days ago and is not having chest pain, will hold on repeat CT of the chest but we will get x-ray to look for other abnormality.  We will admit him after work-up.   7:11 PM Pulmonology came to see the patient and will see in consultation.  They agree with medicine admission after CT imaging is completed of the abdomen and pelvis.  CT scan returned showing no evidence of acute diverticulitis or intra-abdominal acute finding.  No obstruction.  No pneumoperitoneum.  Patient does have a liver hyperdensity that could be malignancy and also has some pleural abnormalities that could be malignancy or mesothelioma.  His labs also returned and he is not critically anemic.  We will call for admission now to get the thoracentesis and further management of his  hypoxia.         Final Clinical Impression(s) / ED Diagnoses Final diagnoses:  Hypoxia  Pleural effusion     Clinical Impression: 1. Hypoxia   2. Pleural effusion     Disposition: Admit  This note was prepared with assistance of Dragon voice recognition software. Occasional wrong-word or sound-a-like substitutions may have occurred due to the inherent limitations of voice recognition software.      Loui Massenburg, Gwenyth Allegra, MD 01/17/22 2056

## 2022-01-17 NOTE — Assessment & Plan Note (Addendum)
Hypoxic into the 80's with new O2 requirement and mild respiratory distress.

## 2022-01-17 NOTE — ED Notes (Signed)
Pt provided a urinal at this time

## 2022-01-18 ENCOUNTER — Encounter (HOSPITAL_COMMUNITY): Payer: Self-pay | Admitting: Family Medicine

## 2022-01-18 DIAGNOSIS — J942 Hemothorax: Secondary | ICD-10-CM | POA: Diagnosis not present

## 2022-01-18 DIAGNOSIS — Z8546 Personal history of malignant neoplasm of prostate: Secondary | ICD-10-CM

## 2022-01-18 DIAGNOSIS — J9601 Acute respiratory failure with hypoxia: Secondary | ICD-10-CM | POA: Diagnosis not present

## 2022-01-18 DIAGNOSIS — R1904 Left lower quadrant abdominal swelling, mass and lump: Secondary | ICD-10-CM

## 2022-01-18 LAB — CBC
HCT: 37.3 % — ABNORMAL LOW (ref 39.0–52.0)
Hemoglobin: 12.3 g/dL — ABNORMAL LOW (ref 13.0–17.0)
MCH: 29.6 pg (ref 26.0–34.0)
MCHC: 33 g/dL (ref 30.0–36.0)
MCV: 89.7 fL (ref 80.0–100.0)
Platelets: 319 10*3/uL (ref 150–400)
RBC: 4.16 MIL/uL — ABNORMAL LOW (ref 4.22–5.81)
RDW: 13.3 % (ref 11.5–15.5)
WBC: 10.8 10*3/uL — ABNORMAL HIGH (ref 4.0–10.5)
nRBC: 0 % (ref 0.0–0.2)

## 2022-01-18 MED ORDER — ACETAMINOPHEN 500 MG PO TABS
500.0000 mg | ORAL_TABLET | Freq: Four times a day (QID) | ORAL | Status: DC | PRN
Start: 2022-01-18 — End: 2022-01-23
  Administered 2022-01-20: 500 mg via ORAL
  Filled 2022-01-18 (×2): qty 1

## 2022-01-18 MED ORDER — OXYCODONE-ACETAMINOPHEN 5-325 MG PO TABS
1.0000 | ORAL_TABLET | Freq: Four times a day (QID) | ORAL | Status: AC | PRN
  Administered 2022-01-20: 1 via ORAL
  Filled 2022-01-18: qty 1

## 2022-01-18 MED ORDER — ALBUTEROL SULFATE (2.5 MG/3ML) 0.083% IN NEBU: 2.5000 mg | INHALATION_SOLUTION | RESPIRATORY_TRACT | Status: DC | PRN

## 2022-01-18 MED ORDER — ATORVASTATIN CALCIUM 40 MG PO TABS
40.0000 mg | ORAL_TABLET | Freq: Every evening | ORAL | Status: DC
  Administered 2022-01-18 – 2022-01-22 (×5): 40 mg via ORAL
  Filled 2022-01-18 (×5): qty 1

## 2022-01-18 MED ORDER — CARVEDILOL 3.125 MG PO TABS
3.1250 mg | ORAL_TABLET | Freq: Every day | ORAL | Status: DC
  Administered 2022-01-19: 3.125 mg via ORAL
  Filled 2022-01-18 (×2): qty 1

## 2022-01-18 MED ORDER — ASCORBIC ACID 500 MG PO TABS
500.0000 mg | ORAL_TABLET | Freq: Every day | ORAL | Status: DC
  Administered 2022-01-18 – 2022-01-23 (×6): 500 mg via ORAL
  Filled 2022-01-18 (×6): qty 1

## 2022-01-18 MED ORDER — VITAMIN D 25 MCG (1000 UNIT) PO TABS
1000.0000 [IU] | ORAL_TABLET | Freq: Every day | ORAL | Status: DC
  Administered 2022-01-18 – 2022-01-23 (×6): 1000 [IU] via ORAL
  Filled 2022-01-18 (×6): qty 1

## 2022-01-18 NOTE — Assessment & Plan Note (Addendum)
S/p DES to LAD in 2016 on Plavix  -continue Coreg - d/c statin given no further mortality benefit in setting of malignancy

## 2022-01-18 NOTE — Assessment & Plan Note (Addendum)
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

## 2022-01-18 NOTE — Progress Notes (Signed)
PROGRESS NOTE  Pedro Pope.  DOB: April 16, 1941  PCP: Shon Baton, MD YNW:[id number]  DOA: 01/17/2022  LOS: 1 day  Hospital Day: 2  Brief narrative: Pedro Eckard. is a 81 y.o. male with PMH significant for CAD s/p DES to LAD 2016, ischemic cardiomyopathy, mild systolic CHF, paroxysmal A-fib, Dressler syndrome, prediabetes, prostate cancer s/p prostatectomy and radiation, fatty liver, gout. Patient presented to the ED on 6/2 with worsening shortness of breath. In April, patient was treated with multiple courses of antibiotics for pneumonia despite which she continued to have shortness of breath and pleuritic chest pain. 5/8, CT chest showed moderate loculated left pleural effusion. 5/16, he was seen by pulmonology as an outpatient and underwent thoracentesis.  1800 cc of amber appearing fluid was removed.  Chest x-ray postprocedure showed worsening peripheral left midlung airspace opacity with a small effusion. His symptoms continue to progress.  Now he has dyspnea on minimal exertion, has poor oral intake, he also noticed increasing swelling and pain to his left flank for the past 4 to 5 days.   6/2, he followed up at pulmonology office.  He was noted to hypoxic, started on oxygen and sent to the ED.  In the ED, patient was afebrile, respiratory rate in 20s, required up to 6 L oxygen by nasal cannula. Labs showed WBC count elevated 11.7, CMP unremarkable, BNP elevated to 192. CT abdomen/pelvis showed large left pleural effusion and peripheral pleural nodules on the left side suggesting malignant neoplastic process such as mesothelioma or pleural metastatic disease.  He was also noted to have a 13 mm lesion in the right lobe of the liver suspicious for hemangioma versus metastatic disease. Admitted to hospitalist service. Pulmonary consultation was obtained.  Subjective: Patient was seen and examined this morning.  Pleasant elderly Caucasian male.  Lying on bed.  On 4 L oxygen by nasal  cannula.  Says he was able to get up and go to the bathroom but was short of breath.  Feels no different than at presentation.  Principal Problem:   Acute respiratory failure with hypoxia (HCC) Active Problems:   CAD S/P percutaneous coronary angioplasty   Pleural effusion on left   Hemothorax   History of prostate cancer   Left lower quadrant abdominal swelling    Assessment and Plan: Acute respiratory failure with hypoxia in the setting of enlarging pleural effusion -Presented with progressive shortness of breath for more than 2 months.  Underwent thoracentesis on 5/16 with recurrence again. -CT scanning on admission also suggestive of peripheral pulm nodules suspicious for mesothelioma or metastasis.  Per pulmonology Dr. Lake Bells, cytology was negative from the fluid sample on 5/16. -Pulmonary following.  Recommended to hold Plavix for 5 days with possible plan of chest tube placement at that time.  -Initially required 6 L oxygen by nasal cannula.  Wean down as tolerated. -Incentive spirometry  Left lower quadrant abdominal swelling -Unclear etiology.  No significant finding on CT abdomen pelvis.  Discussed with pulmonology.  Patient might have had leaking of pleural fluid/hemothorax tracking down to the left flank area.   -Percocet, Tylenol as needed for pain  History of prostate cancer s/p RRP and salave EBRT. Followed by oncology at [facility]. -has chronic hematuria likely due to to radiation cystitis   CAD S/P percutaneous coronary angioplasty S/p DES to LAD in 2016 on Plavix  -PTA, patient was not Coreg, Plavix, statin.   -Currently continued on Coreg and statin.  Plavix on hold for possible need of chest  tube.    Goals of care   Code Status: Full Code    Mobility: Encourage ambulation  Skin assessment: None    Nutritional status:  Body mass index is 20.79 kg/m.          Diet:  Diet Order             Diet regular Room service appropriate? Yes; Fluid  consistency: Thin  Diet effective now                   DVT prophylaxis: None SCDs Start: 01/17/22 2328   Antimicrobials: None Fluid: None Consultants: Pulmonology Family Communication: None at bedside  Status is: Inpatient  Continue in-hospital care because: Needs chest tube placement after Plavix washout Level of care: Progressive   Dispo: The patient is from: Home              Anticipated d/c is to: Pending clinical course              Patient currently is not medically stable to d/c.   Difficult to place patient No     Infusions:    Scheduled Meds:  ascorbic acid  500 mg Oral Daily   atorvastatin  40 mg Oral QPM   carvedilol  3.125 mg Oral Q breakfast   cholecalciferol  1,000 Units Oral Daily    PRN meds: acetaminophen, albuterol, oxyCODONE-acetaminophen   Antimicrobials: Anti-infectives (From admission, onward)    None       Objective: Vitals:   01/18/22 0921 01/18/22 1100  BP: (!) 109/57 (!) 100/55  Pulse:  78  Resp:  18  Temp:  97.9 F (36.6 C)  SpO2: 93% 95%    Intake/Output Summary (Last 24 hours) at 01/18/2022 1359 Last data filed at 01/18/2022 1304 Gross per 24 hour  Intake 296 ml  Output 300 ml  Net -4 ml   Filed Weights   01/17/22 2337  Weight: 62 kg   Weight change:  Body mass index is 20.79 kg/m.   Physical Exam: General exam: Pleasant, elderly Caucasian male. Skin: No rashes, lesions or ulcers. HEENT: Atraumatic, normocephalic, no obvious bleeding Lungs: Diminished air entry in left base.  Otherwise clear to auscultation bilaterally CVS: Regular rate and rhythm, no murmur GI/Abd soft, nontender, nondistended, bowel sound present CNS: Alert, awake, oriented x3 Psychiatry: Mood appropriate Extremities: No pedal edema, no calf tenderness  Data Review: I have personally reviewed the laboratory data and studies available.  F/u labs ordered Unresulted Labs (From admission, onward)     Start     Ordered   01/17/22 1611   Urine Culture  Once,   URGENT       Question:  Indication  Answer:  Flank Pain   01/17/22 1612   01/17/22 1610  Urinalysis, Routine w reflex microscopic  Once,   URGENT        01/17/22 1612            Signed, Terrilee Croak, MD [facility] 01/18/2022

## 2022-01-18 NOTE — Plan of Care (Signed)
  Problem: Education: Goal: Knowledge of General Education information will improve Description Including pain rating scale, medication(s)/side effects and non-pharmacologic comfort measures Outcome: Progressing   

## 2022-01-18 NOTE — Assessment & Plan Note (Signed)
s/p RRP and salave EBRT. Followed by oncology at [facility]. -has chronic hematuria suspect that to be due to radiation cystitis

## 2022-01-18 NOTE — Progress Notes (Signed)
NAME:  Pedro Pope., MRN:  [id number], DOB:  03-08-1941, LOS: 1 ADMISSION DATE:  01/17/2022, CONSULTATION DATE: January 17, 2022 REFERRING MD: Dr. Sherry Ruffing, CHIEF COMPLAINT: Dyspnea  History of Present Illness:  This is a very pleasant 81 year old male who has been followed by [facility] pulmonary for the last several weeks in the setting of a moderate to large left-sided pleural effusion.  The patient is a prior smoker who was diagnosed with pneumonia in late March and treated with antibiotics.  He had progressive dyspnea after this and by April 23 he was noted to have chest pain, coughing, and was treated for pneumonia.  He had persistent pleuritic pain and little resolution of symptoms so he had a CT chest which showed lymphadenopathy and effusion.  He was noted to be on Plavix which was held and plans were made for a thoracentesis which was performed on May 16.  1800 cc of amber appearing fluid was removed.  Postoperative imaging showed persistent opacification of the left lung and a small pleural effusion.  He has followed with pulmonary since the procedure.  He was treated with a course of Levaquin and doxycycline on separate occasions.  He continues to have some degree of dyspnea but it has been steadily worsening ever since the thoracentesis.  He now is unable to take several steps.  For the last 4 to 5 days he is also had increasing swelling and pain in his left flank.  He presented to the pulmonary office today was noted to be hypoxemic and was started on oxygen and sent to the emergency room for further evaluation. Pertinent  Medical History  Coronary artery disease> status post LAD stent, on Plavix Former cigarette smoker, smoked 1-1/2 packs of cigarettes daily up until 1994, Prostate cancer Prediabetes Paroxysmal A-fib Fatty liver   Significant Hospital Events: Including procedures, antibiotic start and stop dates in addition to other pertinent events   June 2 admission 6/3 sitting up  91% with O2 off, states that his breathing is feeling improved  Interim History / Subjective:  Pt sitting up and feeling improved, he has his oxygen off to eat and is satting 91%, denies CP or dyspnea  Objective   Blood pressure (!) 100/55, pulse 78, temperature 97.9 F (36.6 C), temperature source Oral, resp. rate 18, height 5' 8"  (1.727 m), weight 62 kg, SpO2 95 %.        Intake/Output Summary (Last 24 hours) at 01/18/2022 1202 Last data filed at 01/18/2022 0845 Gross per 24 hour  Intake 60 ml  Output 300 ml  Net -240 ml   Filed Weights   01/17/22 2337  Weight: 62 kg    General:  elderly, chronically ill-appearing M sitting up in chair in no distress HEENT: MM pink/moist, sclera anicteric Neuro: awake, alert, oriented and conversational CV: s1s2 rrr, no m/r/g PULM:  no distress on RA, decreased air entry LLL, soft L flank edema noted without erythema or induration GI: soft, non-tender Extremities: warm/dry, no edema  Skin: no rashes or lesions    Resolved Hospital Problem list     Assessment & Plan:  Acute respiratory failure with hypoxemia in setting of enlarging pleural effusion Loculated pleural effusion on left, presumably parapneumonic but differential diagnosis is broad New onset flank mass/swelling Coronary artery disease on Plavix  Concern that he has developed a hemothorax, as the swelling on his left flank is most likely related to slow oozing from the recent thoracentesis and the fact that he is still  taking Plavix.  Given he is improving, the plan is hold Plavix before proceeding with another thoracentesis or chest tube  Ideally it would be best to have him hold Plavix for 5 days (last dose was 6/1).  However, if his symptoms worsen then we will need to proceed with a high risk thoracentesis versus chest tube.  I believe that the new onset left flank swelling is related to bleeding.  It was not present on imaging earlier in May and it just popped up over the  last 4-5 days.  Plan: -continue holding Plavix -no current role for additional imaging -No sub-q heparin or chemical dvt prophylaxis -Administer oxygen to maintain O2 saturation greater than 88% -We will plan for thoracentesis versus chest tube over the next several days based on how his symptoms progress -Warm compresses to left flank swelling  Pulmonary and critical care will follow  Best Practice (right click and "Reselect all SmartList Selections" daily)   Per [facility]  Labs   CBC: Recent Labs  Lab 01/17/22 1617 01/18/22 0351  WBC 11.7* 10.8*  NEUTROABS 8.7*  --   HGB 14.1 12.3*  HCT 42.4 37.3*  MCV 90.0 89.7  PLT 346 319     Basic Metabolic Panel: Recent Labs  Lab 01/17/22 1722  NA 140  K 4.7  CL 111  CO2 22  GLUCOSE 95  BUN 19  CREATININE 0.82  CALCIUM 8.1*    GFR: Estimated Creatinine Clearance: 63 mL/min (by C-G formula based on SCr of 0.82 mg/dL). Recent Labs  Lab 01/17/22 1617 01/17/22 1636 01/18/22 0351  WBC 11.7*  --  10.8*  LATICACIDVEN  --  1.5  --      Liver Function Tests: Recent Labs  Lab 01/17/22 1722  AST 36  ALT 16  ALKPHOS 61  BILITOT 1.2  PROT 4.9*  ALBUMIN 2.1*    No results for input(s): LIPASE, AMYLASE in the last 168 hours. No results for input(s): AMMONIA in the last 168 hours.  ABG    Component Value Date/Time   TCO2 19 11/03/2014 2108      Coagulation Profile: No results for input(s): INR, PROTIME in the last 168 hours.  Cardiac Enzymes: No results for input(s): CKTOTAL, CKMB, CKMBINDEX, TROPONINI in the last 168 hours.  HbA1C: Hgb A1c MFr Bld  Date/Time Value Ref Range Status  10/31/2014 03:13 AM 6.1 (H) 4.8 - 5.6 % Final    Comment:    (NOTE)         Pre-diabetes: 5.7 - 6.4         Diabetes: >6.4         Glycemic control for adults with diabetes: <7.0     CBG: No results for input(s): GLUCAP in the last 168 hours.  Review of Systems:   Please see the history of present illness. All other  systems reviewed and are negative    Past Medical History:  He,  has a past medical history of Anosmia, CAD (coronary artery disease), native coronary artery, Chronic headaches, Dressler's syndrome ([place]), ED (erectile dysfunction), Elevated TSH, Fatty liver, Gout, Ischemic cardiomyopathy, PAF (paroxysmal atrial fibrillation) ([place]), Pre-diabetes, Prostate cancer ([place]), and Rheumatic fever (1947).   Surgical History:   Past Surgical History:  Procedure Laterality Date   CORONARY ANGIOPLASTY WITH STENT PLACEMENT  10/30/14   STEMI s/p DES to proximal LAD; residual 80 % proximal and 60% mid LCx diease   LEFT HEART CATHETERIZATION WITH CORONARY ANGIOGRAM N/A 10/30/2014   Procedure: LEFT HEART CATHETERIZATION  WITH CORONARY ANGIOGRAM;  Surgeon: Lorretta Harp, MD;  Location: [facility] CATH LAB;  Service: Cardiovascular;  Laterality: N/A;   PERCUTANEOUS CORONARY STENT INTERVENTION (PCI-S)  10/30/2014   Procedure: PERCUTANEOUS CORONARY STENT INTERVENTION (PCI-S);  Surgeon: Lorretta Harp, MD;  Location: [facility] CATH LAB;  Service: Cardiovascular;;   PROSTATECTOMY     THORACENTESIS Left 12/31/2021   Procedure: THORACENTESIS;  Surgeon: Candee Furbish, MD;  Location: [facility] ENDOSCOPY;  Service: Pulmonary;  Laterality: Left;   TONSILLECTOMY       Social History:   reports that he quit smoking about 29 years ago. His smoking use included cigarettes. He has never used smokeless tobacco. He reports that he does not currently use alcohol. He reports that he does not use drugs.   Family History:  His family history includes Cancer in an other family member; Coronary artery disease (age of onset: 97) in his brother; Diabetes in an other family member; Gout in an other family member; Hyperlipidemia in an other family member; Hypertension in an other family member.   Allergies Allergies  Allergen Reactions   Penicillins Anaphylaxis   Corn Syrup [Glucose] Other (See Comments)    High fructose corn syrup causes gout    Aspirin Adult Low [Aspirin] Itching and Rash    Dr. [name] suspects that long term caused a rash, short term use appears to be ok     Home Medications  Prior to Admission medications   Medication Sig Start Date End Date Taking? Authorizing Provider  acetaminophen (TYLENOL) 500 MG tablet Take 500 mg by mouth every 6 (six) hours as needed for mild pain or moderate pain.    [provider]  albuterol (VENTOLIN HFA) 108 (90 Base) MCG/ACT inhaler Inhale 2 puffs into the lungs every 4 (four) hours as needed for shortness of breath. 12/23/21   [provider]  ascorbic acid (VITAMIN C) 500 MG tablet Take 500 mg by mouth daily. 11/01/14   [provider]  atorvastatin (LIPITOR) 40 MG tablet TAKE 1 TABLET BY MOUTH EVERY DAY AT 6PM 07/03/21   Lorretta Harp, MD  Blood Pressure Monitor KIT For daily blood pressure monitoring;  Arm Cuff 06/05/15   Lorretta Harp, MD  carvedilol (COREG) 3.125 MG tablet TAKE 1 TABLET BY MOUTH TWICE A DAY WITH MEALS Patient taking differently: Take 3.125 mg by mouth daily. 07/27/20   Lorretta Harp, MD  Cholecalciferol 25 MCG (1000 UT) tablet Take 1,000 Units by mouth daily.    [provider]  clopidogrel (PLAVIX) 75 MG tablet TAKE 1 TABLET BY MOUTH EVERY DAY 12/23/21   Lorretta Harp, MD  colchicine 0.6 MG tablet Take 1 tablet (0.6 mg total) by mouth daily. Patient taking differently: Take 0.6 mg by mouth daily as needed (Gout). 11/06/14   Eileen Stanford, PA-C  guaiFENesin (MUCINEX) 600 MG 12 hr tablet Take 600 mg by mouth 2 (two) times daily.    [provider]  levofloxacin (LEVAQUIN) 500 MG tablet Take 500 mg by mouth daily. 12/23/21   [provider]  nitroGLYCERIN (NITROSTAT) 0.4 MG SL tablet Place 1 tablet (0.4 mg total) under the tongue every 5 (five) minutes as needed for chest pain. 06/28/21   Lorretta Harp, MD  TRELEGY ELLIPTA 100-62.5-25 MCG/ACT AEPB Inhale 1 puff into the lungs daily. Patient not  taking: Reported on 01/17/2022 12/23/21   [provider]     Critical care time: n/a    Otilio Carpen Willow Reczek, PA-C [place]  Pulmonary & Critical care please call [phone] until 7pm After 7:00 pm call Elink  [phone]

## 2022-01-18 NOTE — Assessment & Plan Note (Signed)
Possible small hematoma.  No significant findings seen on CT abdomen/pelvis

## 2022-01-18 NOTE — Progress Notes (Signed)
Mobility Specialist Progress Note:   01/18/22 1357  Mobility  Activity Ambulated with assistance in hallway  Level of Assistance Independent after set-up  Assistive Device None  Distance Ambulated (ft) 260 ft  Activity Response Tolerated well  $Mobility charge 1 Mobility   Pt received in bed willing to participate in mobility. No complaints of pain. Required 1 standing break d/t feeling SOB (SpO2 90% on 6L) . Left in bed with call bell in reach and all needs met.   [facility] Danielys Madry Mobility Specialist

## 2022-01-19 DIAGNOSIS — J9601 Acute respiratory failure with hypoxia: Secondary | ICD-10-CM | POA: Diagnosis not present

## 2022-01-19 LAB — TROPONIN I (HIGH SENSITIVITY): Troponin I (High Sensitivity): 11 ng/L (ref <18)

## 2022-01-19 MED ORDER — DILTIAZEM HCL-DEXTROSE 125-5 MG/125ML-% IV SOLN (PREMIX)
5.0000 mg/h | INTRAVENOUS | Status: DC
  Administered 2022-01-19: 5 mg/h via INTRAVENOUS
  Filled 2022-01-19 (×2): qty 125

## 2022-01-19 MED ORDER — CARVEDILOL 3.125 MG PO TABS
3.1250 mg | ORAL_TABLET | Freq: Two times a day (BID) | ORAL | Status: DC
  Administered 2022-01-19 – 2022-01-23 (×7): 3.125 mg via ORAL
  Filled 2022-01-19 (×10): qty 1

## 2022-01-19 MED ORDER — LIDOCAINE 5 % EX PTCH
1.0000 | MEDICATED_PATCH | Freq: Every day | CUTANEOUS | Status: DC | PRN
  Administered 2022-01-19: 1 via TRANSDERMAL
  Filled 2022-01-19: qty 1

## 2022-01-19 MED ORDER — METOPROLOL TARTRATE 5 MG/5ML IV SOLN
5.0000 mg | Freq: Once | INTRAVENOUS | Status: AC
  Administered 2022-01-19: 5 mg via INTRAVENOUS
  Filled 2022-01-19: qty 5

## 2022-01-19 NOTE — Progress Notes (Signed)
Notified that patient became hypotensive on Cardizem infusion and BP recovered when it was stopped.   He has been in sinus rhythm since 16:28. Plan to discontinue the Cardizem infusion.

## 2022-01-19 NOTE — Progress Notes (Signed)
NAME:  Pedro Pope., MRN:  [id number], DOB:  07-Apr-1941, LOS: 2 ADMISSION DATE:  01/17/2022, CONSULTATION DATE: January 17, 2022 REFERRING MD: Dr. Sherry Ruffing, CHIEF COMPLAINT: Dyspnea  History of Present Illness:  This is a very pleasant 81 year old male who has been followed by [facility] pulmonary for the last several weeks in the setting of a moderate to large left-sided pleural effusion.  The patient is a prior smoker who was diagnosed with pneumonia in late March and treated with antibiotics.  He had progressive dyspnea after this and by April 23 he was noted to have chest pain, coughing, and was treated for pneumonia.  He had persistent pleuritic pain and little resolution of symptoms so he had a CT chest which showed lymphadenopathy and effusion.  He was noted to be on Plavix which was held and plans were made for a thoracentesis which was performed on May 16.  1800 cc of amber appearing fluid was removed.  Postoperative imaging showed persistent opacification of the left lung and a small pleural effusion.  He has followed with pulmonary since the procedure.  He was treated with a course of Levaquin and doxycycline on separate occasions.  He continues to have some degree of dyspnea but it has been steadily worsening ever since the thoracentesis.  He now is unable to take several steps.  For the last 4 to 5 days he is also had increasing swelling and pain in his left flank.  He presented to the pulmonary office today was noted to be hypoxemic and was started on oxygen and sent to the emergency room for further evaluation.  Pertinent  Medical History  Coronary artery disease> status post LAD stent, on Plavix Former cigarette smoker, smoked 1-1/2 packs of cigarettes daily up until 1994, Prostate cancer Prediabetes Paroxysmal A-fib Fatty liver   Significant Hospital Events: Including procedures, antibiotic start and stop dates in addition to other pertinent events   June 2 admission 6/3 sitting up  91% with O2 off, states that his breathing is feeling improved 6/4 atrial fibrillation  Interim History / Subjective:   Feels better Went into atrial fibrillation this morning, given metoprolol  Objective   Blood pressure 110/60, pulse (!) 126, temperature 98.3 F (36.8 C), resp. rate (!) 25, height 5\' 8"  (1.727 m), weight 61.2 kg, SpO2 93 %.        Intake/Output Summary (Last 24 hours) at 01/19/2022 0944 Last data filed at 01/18/2022 2000 Gross per 24 hour  Intake 596 ml  Output --  Net 596 ml   Filed Weights   01/17/22 2337 01/19/22 0302  Weight: 62 kg 61.2 kg    General:  Resting comfortably in bed HENT: NCAT OP clear PULM: Diminished on left B, normal effort CV: Irreg irreg, no mgr GI: BS+, soft, nontender MSK: normal bulk and tone Neuro: awake, alert, no distress, MAEW    Resolved Hospital Problem list     Assessment & Plan:  Acute respiratory failure with hypoxemia in setting of enlarging pleural effusion Loculated pleural effusion on left, presumably parapneumonic but differential diagnosis is broad; concern for hemothorax given plavix use and hematoma New flank mass/swelling> likely hematoma Left upper lobe infiltrate and mediastinal adenopathy> presumably pneumonia but in former smoker malignancy possible Coronary artery disease on Plavix Atrial fibrillation  Plan: Given concern for bleeding after the last thoracentesis risk:benefit ratio still favors observation while awaiting plavix washout today, however if he develops worsening dyspnea we will need to intervene with chest tube sooner Would  plan for pigtail chest tube this week and likely bronchoscopy to assess LUL infiltrate while we have him off plavix Administer O2 to maintain O2 saturation > 88% ambulate  Pulmonary and critical care will follow  Best Practice (right click and "Reselect all SmartList Selections" daily)   Per [place]   Critical care time: n/a    Roselie Awkward, MD [place]  PCCM Pager: [phone] Cell: [phone] After 7:00 pm call Elink  [phone]

## 2022-01-19 NOTE — Progress Notes (Addendum)
Patient converted to NSR at 1628.  Per Dr. Pietro Cassis, titrate Cardizem back to 5mg /hr and keep running overnight.

## 2022-01-19 NOTE — Progress Notes (Signed)
PROGRESS NOTE  Pedro Pope.  DOB: 09-29-1940  PCP: Shon Baton, MD YHC:[id number]  DOA: 01/17/2022  LOS: 2 days  Hospital Day: 3  Brief narrative: Pedro Zuver. is a 81 y.o. male with PMH significant for CAD s/p DES to LAD 2016, ischemic cardiomyopathy, mild systolic CHF, paroxysmal A-fib, Dressler syndrome, prediabetes, prostate cancer s/p prostatectomy and radiation, fatty liver, gout. Patient presented to the ED on 6/2 with worsening shortness of breath. In April, patient was treated with multiple courses of antibiotics for pneumonia despite which she continued to have shortness of breath and pleuritic chest pain. 5/8, CT chest showed moderate loculated left pleural effusion. 5/16, he was seen by pulmonology as an outpatient and underwent thoracentesis.  1800 cc of amber appearing fluid was removed.  Chest x-ray postprocedure showed worsening peripheral left midlung airspace opacity with a small effusion. His symptoms continue to progress.  Now he has dyspnea on minimal exertion, has poor oral intake, he also noticed increasing swelling and pain to his left flank for the past 4 to 5 days.   6/2, he followed up at pulmonology office.  He was noted to hypoxic, started on oxygen and sent to the ED.  In the ED, patient was afebrile, respiratory rate in 20s, required up to 6 L oxygen by nasal cannula. Labs showed WBC count elevated 11.7, CMP unremarkable, BNP elevated to 192. CT abdomen/pelvis showed large left pleural effusion and peripheral pleural nodules on the left side suggesting malignant neoplastic process such as mesothelioma or pleural metastatic disease.  He was also noted to have a 13 mm lesion in the right lobe of the liver suspicious for hemangioma versus metastatic disease. Admitted to hospitalist service. Pulmonary consultation was obtained.  Subjective: Patient was seen and examined this morning.   Just 10 minutes prior to my visit, patient flipped from sinus rhythm  to A-fib with heart rate up to 130s.  He was feeling palpitations and left arm tingling.  He has a history of A-fib for which he used to be on Xarelto in the past but that was held after he started having hematuria last year.  He is on Coreg that he was given this morning.  He had a stent placed in 2014 after which he was on Plavix.  Plavix is on hold for last 3 days. IV metoprolol 1 dose 5 mg was given after which his heart rate improved to 90s, blood pressure remained stable.  Symptoms faded away.  Troponin negative.  Principal Problem:   Acute respiratory failure with hypoxia (HCC) Active Problems:   CAD S/P percutaneous coronary angioplasty   Pleural effusion on left   Hemothorax   History of prostate cancer   Left lower quadrant abdominal swelling    Assessment and Plan: Acute respiratory failure with hypoxia in the setting of enlarging pleural effusion -Presented with progressive shortness of breath for more than 2 months.  Underwent thoracentesis on 5/16 with recurrence again. -CT scanning on admission also suggestive of peripheral pulm nodules suspicious for mesothelioma or metastasis.  Per pulmonology Dr. Lake Bells, cytology was negative from the fluid sample on 5/16. -Pulmonary following.  Recommended to hold Plavix for 5 days with possible plan of chest tube placement at that time.  -Initially required 6 L oxygen by nasal cannula.  Wean down as tolerated. -Incentive spirometry  A-fib with RVR -This morning, patient flipped from sinus rhythm to A-fib with heart rate up to 130s.  He was feeling palpitations and left arm tingling.  He has a history of A-fib for which he used to be on Xarelto in the past but that was held after he started having hematuria last year.  He is on Coreg that he was given this morning.  He had a stent placed in 2014 after which he was on Plavix.  Plavix is on hold for last 3 days. IV metoprolol 1 dose 5 mg was given after which his heart rate improved to 90s,  blood pressure remained stable.  Symptoms faded away.  Troponin negative.  CAD S/P percutaneous coronary angioplasty S/p DES to LAD in 2016 on Plavix  -PTA, patient was not Coreg, Plavix, statin.   -Currently continued on Coreg and statin.  Plavix on hold for possible need of chest tube.    Left lower quadrant abdominal swelling -Unclear etiology.  No significant finding on CT abdomen pelvis.  Discussed with pulmonology.  Patient might have had leaking of pleural fluid/hemothorax tracking down to the left flank area.   -Percocet, Tylenol as needed for pain  History of prostate cancer s/p RRP and salave EBRT. Followed by oncology at [facility]. -has chronic hematuria likely due to to radiation cystitis.  Currently not bleeding   Goals of care   Code Status: Full Code    Mobility: Encourage ambulation  Skin assessment: None    Nutritional status:  Body mass index is 20.51 kg/m.          Diet:  Diet Order             Diet regular Room service appropriate? Yes; Fluid consistency: Thin  Diet effective now                   DVT prophylaxis: None SCDs Start: 01/17/22 2328   Antimicrobials: None Fluid: None Consultants: Pulmonology Family Communication: None at bedside  Status is: Inpatient  Continue in-hospital care because: Needs chest tube placement after Plavix washout Level of care: Progressive   Dispo: The patient is from: Home              Anticipated d/c is to: Pending clinical course              Patient currently is not medically stable to d/c.   Difficult to place patient No     Infusions:    Scheduled Meds:  ascorbic acid  500 mg Oral Daily   atorvastatin  40 mg Oral QPM   carvedilol  3.125 mg Oral Q breakfast   cholecalciferol  1,000 Units Oral Daily    PRN meds: acetaminophen, albuterol, oxyCODONE-acetaminophen   Antimicrobials: Anti-infectives (From admission, onward)    None       Objective: Vitals:   01/19/22 0921 01/19/22  1030  BP: 110/60 93/69  Pulse: (!) 126 (!) 104  Resp: (!) 25 (!) 25  Temp: 98.3 F (36.8 C) (!) 97.4 F (36.3 C)  SpO2: 93% 93%    Intake/Output Summary (Last 24 hours) at 01/19/2022 1045 Last data filed at 01/19/2022 0900 Gross per 24 hour  Intake 836 ml  Output 0 ml  Net 836 ml    Filed Weights   01/17/22 2337 01/19/22 0302  Weight: 62 kg 61.2 kg   Weight change: -0.816 kg Body mass index is 20.51 kg/m.   Physical Exam: General exam: Pleasant, elderly Caucasian male. Skin: No rashes, lesions or ulcers. HEENT: Atraumatic, normocephalic, no obvious bleeding Lungs: Diminished air entry in left hemithorax.  Right side clear CVS: Irregular tachycardia, no murmur  GI/Abd soft,  and area of swelling and tenderness in left flank, nondistended, bowel sound present CNS: Alert, awake, oriented x3 Psychiatry: Mood appropriate Extremities: No pedal edema, no calf tenderness  Data Review: I have personally reviewed the laboratory data and studies available.  F/u labs ordered Unresulted Labs (From admission, onward)     Start     Ordered   01/17/22 1611  Urine Culture  Once,   URGENT       Question:  Indication  Answer:  Flank Pain   01/17/22 1612   01/17/22 1610  Urinalysis, Routine w reflex microscopic  Once,   URGENT        01/17/22 1612            Signed, Terrilee Croak, MD [facility] 01/19/2022

## 2022-01-19 NOTE — Progress Notes (Signed)
   01/19/22 0921  Assess: MEWS Score  Temp 98.3 F (36.8 C)  BP 110/60  MAP (mmHg) 76  Pulse Rate (!) 126  ECG Heart Rate (!) 130  Resp (!) 25  Level of Consciousness Alert  SpO2 93 %  O2 Device Nasal Cannula  O2 Flow Rate (L/min) 4 L/min  Assess: MEWS Score  MEWS Temp 0  MEWS Systolic 0  MEWS Pulse 3  MEWS RR 1  MEWS LOC 0  MEWS Score 4  MEWS Score Color Red  Assess: if the MEWS score is Yellow or Red  Were vital signs taken at a resting state? Yes  Focused Assessment Change from prior assessment (see assessment flowsheet)  Does the patient meet 2 or more of the SIRS criteria? No  MEWS guidelines implemented *See Row Information* Yes  Treat  Pain Scale 0-10  Pain Score 0  Take Vital Signs  Increase Vital Sign Frequency  Red: Q 1hr X 4 then Q 4hr X 4, if remains red, continue Q 4hrs  Escalate  MEWS: Escalate Red: discuss with charge nurse/RN and provider, consider discussing with RRT  Notify: Charge Nurse/RN  Name of Charge Nurse/RN Notified [place]  Date Charge Nurse/RN Notified 01/19/22  Time Charge Nurse/RN Notified 0737  Notify: Provider  Provider Name/Title Dr. Pietro Cassis  Date Provider Notified 01/19/22  Time Provider Notified [phone]  Method of Notification Page  Notification Reason Change in status  Provider response At bedside  Date of Provider Response 01/19/22  Time of Provider Response [id number]  Document  Patient Outcome Other (Comment) (stabilized)  Assess: SIRS CRITERIA  SIRS Temperature  0  SIRS Pulse 1  SIRS Respirations  1  SIRS WBC 0  SIRS Score Sum  2

## 2022-01-20 ENCOUNTER — Inpatient Hospital Stay (HOSPITAL_COMMUNITY): Payer: Medicare Other

## 2022-01-20 DIAGNOSIS — J90 Pleural effusion, not elsewhere classified: Secondary | ICD-10-CM

## 2022-01-20 DIAGNOSIS — J9601 Acute respiratory failure with hypoxia: Secondary | ICD-10-CM | POA: Diagnosis not present

## 2022-01-20 DIAGNOSIS — R0602 Shortness of breath: Secondary | ICD-10-CM

## 2022-01-20 LAB — TYPE AND SCREEN
ABO/RH(D): O POS
Antibody Screen: NEGATIVE

## 2022-01-20 LAB — ABO/RH: ABO/RH(D): O POS

## 2022-01-20 LAB — PROTIME-INR
INR: 1.2 (ref 0.8–1.2)
Prothrombin Time: 15.1 seconds (ref 11.4–15.2)

## 2022-01-20 MED ORDER — IOHEXOL 300 MG/ML  SOLN
100.0000 mL | Freq: Once | INTRAMUSCULAR | Status: AC | PRN
Start: 2022-01-20 — End: 2022-01-20
  Administered 2022-01-20: 100 mL via INTRAVENOUS

## 2022-01-20 NOTE — Consult Note (Signed)
[address]       [place] [address]             Texline Jr. Thayer Record #[id number] Date of Birth: 1941-07-11  Referring: No ref. provider found Primary Care: Shon Baton, MD Primary Cardiologist: Quay Burow, MD  Chief Complaint:    Chief Complaint  Patient presents with   Shortness of Breath    History of Present Illness:    Pedro Pope. 81 y.o. male admitted to the hospital with shortness of breath.  He was originally treated for parapneumonic effusion in May with thoracentesis.  He represented with worsening shortness of breath and underwent a second thoracentesis.  He subsequently restarted his Plavix and comes in with for shortness of breath and a larger left pleural effusion along with left chest wall swelling.  CTS has been consulted to assist with management.    Past Medical History:  Diagnosis Date   Anosmia    CAD (coronary artery disease), native coronary artery    a. 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 placed on colchicine    ED (erectile dysfunction)    Elevated TSH    Fatty liver    Gout    Ischemic cardiomyopathy    a. 2D ECHO 11/04/14 w/ EF 40-45% with LV WMA, G2DD, mild MR, mild LA dilation, mod TR.   PAF (paroxysmal atrial fibrillation) ([place])    a. Dx on 11/03/14 admission. Not placed on longterm AC due to DAPT w/ receent DES. Will plan for outpt heart monitor    Pre-diabetes    a. HgA1c 6.1 in 10/2014   Prostate cancer Procedure [facility])    s/p prostatectomy and XRT   Rheumatic fever 1947    Past Surgical History:  Procedure Laterality Date   CORONARY ANGIOPLASTY WITH STENT PLACEMENT  10/30/14   STEMI s/p DES to proximal LAD; residual 80 % proximal and 60% mid LCx diease   LEFT HEART CATHETERIZATION WITH CORONARY ANGIOGRAM N/A 10/30/2014   Procedure: LEFT HEART  CATHETERIZATION WITH CORONARY ANGIOGRAM;  Surgeon: Lorretta Harp, MD;  Location: [facility] CATH LAB;  Service: Cardiovascular;  Laterality: N/A;   PERCUTANEOUS CORONARY STENT INTERVENTION (PCI-S)  10/30/2014   Procedure: PERCUTANEOUS CORONARY STENT INTERVENTION (PCI-S);  Surgeon: Lorretta Harp, MD;  Location: [facility] CATH LAB;  Service: Cardiovascular;;   PROSTATECTOMY     THORACENTESIS Left 12/31/2021   Procedure: THORACENTESIS;  Surgeon: Candee Furbish, MD;  Location: [facility] ENDOSCOPY;  Service: Pulmonary;  Laterality: Left;   TONSILLECTOMY      Family History  Problem Relation Age of Onset   Cancer Other    Coronary artery disease Brother 72       s/p CABG   Diabetes Other    Hyperlipidemia Other    Hypertension Other    Gout Other      Social History   Tobacco Use  Smoking Status Former   Types: Cigarettes   Quit date: 08/18/1992   Years since quitting: 29.4  Smokeless Tobacco Never    Social History   Substance and Sexual Activity  Alcohol Use Not Currently   Comment: social     Allergies  Allergen Reactions   Penicillins Anaphylaxis  Corn Syrup [Glucose] Other (See Comments)    High fructose corn syrup causes gout   Aspirin Adult Low [Aspirin] Itching and Rash    Dr. [name] suspects that long term caused a rash, short term use appears to be ok    Current Facility-Administered Medications  Medication Dose Route Frequency Provider Last Rate Last Admin   acetaminophen (TYLENOL) tablet 500 mg  500 mg Oral Q6H PRN Tu, Ching T, DO   500 mg at 01/20/22 1050   albuterol (PROVENTIL) (2.5 MG/3ML) 0.083% nebulizer solution 2.5 mg  2.5 mg Inhalation Q4H PRN Tu, Ching T, DO       ascorbic acid (VITAMIN C) tablet 500 mg  500 mg Oral Daily Tu, Ching T, DO   500 mg at 01/20/22 0955   atorvastatin (LIPITOR) tablet 40 mg  40 mg Oral QPM Tu, Ching T, DO   40 mg at 01/19/22 1604   carvedilol (COREG) tablet 3.125 mg  3.125 mg Oral BID WC Dahal, Binaya, MD   3.125 mg at 01/20/22 1000    cholecalciferol (VITAMIN D3) tablet 1,000 Units  1,000 Units Oral Daily Tu, Ching T, DO   1,000 Units at 01/20/22 0955   lidocaine (LIDODERM) 5 % 1 patch  1 patch Transdermal Daily PRN Opyd, Ilene Qua, MD   1 patch at 01/19/22 2026   oxyCODONE-acetaminophen (PERCOCET/ROXICET) 5-325 MG per tablet 1 tablet  1 tablet Oral Q6H PRN Terrilee Croak, MD        Review of Systems  Constitutional:  Positive for malaise/fatigue. Negative for fever.  Respiratory:  Positive for shortness of breath.   Cardiovascular:  Negative for chest pain.   PHYSICAL EXAMINATION: BP 114/82   Pulse 82   Temp 98 F (36.7 C)   Resp 18   Ht 5\' 8"  (1.727 m)   Wt 61.6 kg   SpO2 92%   BMI 20.66 kg/m   Physical Exam Constitutional:      General: He is not in acute distress.    Appearance: He is normal weight. He is not ill-appearing.  Cardiovascular:     Rate and Rhythm: Normal rate.  Pulmonary:     Effort: Pulmonary effort is normal.  Chest:     Comments: Left flank fullness.  No bruising Musculoskeletal:        General: Normal range of motion.  Neurological:     General: No focal deficit present.     Mental Status: He is alert and oriented to person, place, and time.     Diagnostic Studies & Laboratory data:     Recent Radiology Findings:   DG Chest 2 View  Result Date: 01/17/2022 CLINICAL DATA:  Worsening hypoxia. Known left pleural effusion recurrence EXAM: CHEST - 2 VIEW COMPARISON:  Chest CT 01/15/2022.  Radiograph 12/31/2021 FINDINGS: Large partially loculated left pleural effusion is not significantly changed from recent chest CT. Lateral component tracks towards the apex. There is underlying left lung opacities. Right lung emphysema without acute findings. Heart size is obscured by lung opacity. No pneumothorax. IMPRESSION: 1. Large partially loculated left pleural effusion, not significantly changed from CT 2 days ago. Underlying left lung opacities were better assessed on recent CT. 2. Emphysema.  No  acute right lung findings. Electronically Signed   By: Keith Rake M.D.   On: 01/17/2022 17:08   CT Chest Wo Contrast  Result Date: 01/17/2022 CLINICAL DATA:  Follow-up pneumonia, parapneumonic effusion * Tracking Code: BO * EXAM: CT CHEST WITHOUT CONTRAST TECHNIQUE: Multidetector CT imaging  of the chest was performed following the standard protocol without IV contrast. RADIATION DOSE REDUCTION: This exam was performed according to the departmental dose-optimization program which includes automated exposure control, adjustment of the mA and/or kV according to patient size and/or use of iterative reconstruction technique. COMPARISON:  12/23/2021 FINDINGS: Cardiovascular: Aortic atherosclerosis. Aortic valve calcifications. Normal heart size. Subendocardial fibrofatty scarring of the left ventricular apex in keeping with prior infarction (series 2, image 117). Three-vessel coronary artery calcifications. No pericardial effusion. Mediastinum/Nodes: Interval enlargement of pretracheal, AP window, and left hilar lymph nodes, largest AP window node measuring 2.0 x 1.9 cm, previously 1.7 x 1.5 cm (series 2, image 66). Thyroid gland, trachea, and esophagus demonstrate no significant findings. Lungs/Pleura: Interval increase in volume of a left pleural effusion, now large, with extensive associated pleural thickening and nodularity throughout the left hemithorax (series 2, image 138). There is now essentially complete consolidation and or atelectasis of the left upper lobe sans lingula, with proximal obstruction of the left upper lobe bronchus (series 5, image 77). Moderate to severe underlying centrilobular and paraseptal emphysema. Mild pulmonary fibrosis of the aerated right lung, characterized by irregular peripheral interstitial opacity, septal thickening, and areas of subpleural bronchiolectasis at the right lung base. Upper Abdomen: No acute abnormality. Nonobstructive right nephrolithiasis. Musculoskeletal:  No chest wall abnormality. No suspicious osseous lesions identified. IMPRESSION: 1. Interval increase in volume of a left pleural effusion, now large, with extensive associated pleural thickening and nodularity throughout the left hemithorax. There is now essentially complete consolidation and or atelectasis of the left upper lobe sans lingula, with proximal obstruction of the left upper lobe bronchus. Findings are most consistent with underlying primary lung malignancy and associated pleural metastatic disease. 2. Interval enlargement of pretracheal, AP window, and left hilar lymph nodes, which may be reactive but are highly suspicious for nodal metastatic disease. 3. Underlying emphysema. 4. Mild pulmonary fibrosis of the aerated right lung, characterized by irregular peripheral interstitial opacity, septal thickening, and areas of subpleural bronchiolectasis at the right lung base. Although not optimally characterized by this non tailored examination, findings are categorized as probable UIP per consensus guidelines: Diagnosis of Idiopathic Pulmonary Fibrosis: An Official ATS/ERS/JRS/ALAT Clinical Practice Guideline. [place], Iss 5, [phone], Apr 18 2017. 5. Coronary artery disease. 6. Aortic valve calcifications. Correlate for echocardiographic evidence of aortic valve dysfunction. 7. Nonobstructive right nephrolithiasis. These results will be called to the ordering clinician or representative by the Radiologist Assistant, and communication documented in the PACS or [employer]. Aortic Atherosclerosis (ICD10-I70.0) and Emphysema (ICD10-J43.9). Electronically Signed   By: Delanna Ahmadi M.D.   On: 01/17/2022 09:00   CT CHEST WO CONTRAST  Result Date: 12/23/2021 CLINICAL DATA:  Pneumonia EXAM: CT CHEST WITHOUT CONTRAST TECHNIQUE: Multidetector CT imaging of the chest was performed following the standard protocol without IV contrast. RADIATION DOSE REDUCTION: This exam was performed  according to the departmental dose-optimization program which includes automated exposure control, adjustment of the mA and/or kV according to patient size and/or use of iterative reconstruction technique. COMPARISON:  Radiograph 12/08/2021 FINDINGS: Cardiovascular: Normal cardiac size.No pericardial disease.Coronary artery calcifications. There is unchanged subendocardial hypoattenuation in the left ventricle associated thinning of the ventricular wall compatible with chronic LAD territory infarct.There is moderate atherosclerosis of the thoracic aorta. Mediastinum/Nodes: There is mediastinal and hilar lymphadenopathy. For reference, left hilar lymph node measures up to 1.2 cm short axis (series 2, image 91). Left paratracheal lymph node measures to 1.0 cm short axis (  series 2, image 66).The thyroid is unremarkable.Esophagus is unremarkable.The trachea is unremarkable. Lungs/Pleura: There is a moderate size left pleural effusion. There is a persistent left upper lobe airspace consolidation with mucoid impaction and some nodular areas. There is centrilobular and paraseptal emphysema, mid apical predominant.There is subpleural reticulation and bronchiolectasis likely reflecting mild pulmonary fibrosis. No pneumothorax. Upper Abdomen: No acute abnormality. Musculoskeletal: Mild pectus deformity. No acute osseous abnormality. No suspicious lytic or blastic lesions. Multilevel degenerative changes of the spine. IMPRESSION: Severe left upper lobe pneumonia with mucoid impaction and nodular areas. Mediastinal and left hilar lymphadenopathy, likely reactive. Moderate size left pleural effusion. Recommend follow-up chest CT in 3 months after treatment to ensure resolution and rule out underlying neoplasm. Mid to apical predominant centrilobular and paraseptal emphysema. Coronary artery atherosclerosis with evidence of chronic LAD territory infarct. Aortic Atherosclerosis (ICD10-I70.0) and Emphysema (ICD10-J43.9).  Electronically Signed   By: Maurine Simmering M.D.   On: 12/23/2021 16:12   CT ABDOMEN PELVIS W CONTRAST  Result Date: 01/17/2022 CLINICAL DATA:  Pain left lower quadrant of abdomen, rectal bleeding EXAM: CT ABDOMEN AND PELVIS WITH CONTRAST TECHNIQUE: Multidetector CT imaging of the abdomen and pelvis was performed using the standard protocol following bolus administration of intravenous contrast. RADIATION DOSE REDUCTION: This exam was performed according to the departmental dose-optimization program which includes automated exposure control, adjustment of the mA and/or kV according to patient size and/or use of iterative reconstruction technique. CONTRAST:  54mL OMNIPAQUE IOHEXOL 300 MG/ML  SOLN COMPARISON:  Previous CT chest done on 01/15/2022, chest radiographs done earlier today FINDINGS: Lower chest: Large left pleural effusion is noted. There are peripheral pleural nodules on the left side. There is almost complete atelectasis in the visualized left lower lung fields. There is increase in interstitial markings in the periphery of right lower lung fields possibly suggesting interstitial lung disease. There are scattered coronary artery calcifications. Dense calcification is seen in the region of mitral annulus. Hepatobiliary: In the image 14 of series 4, there is 1.3 cm low-density lesion in the right lobe of liver. There is no significant dilation of bile ducts. Gallbladder is not distended. Pancreas: No focal abnormality is seen. Spleen: There is medial displacement of spleen caused by large left pleural effusion. Adrenals/Urinary Tract: Adrenals are unremarkable. There is no hydronephrosis. Left kidney is displaced medially. There is 7 mm calculus in the midportion of right kidney. Ureters are not dilated. Urinary bladder is unremarkable. Stomach/Bowel: Stomach is not distended. Small bowel loops are not dilated. Appendix is not dilated. Appendix is noted in the right pelvic cavity. There is no significant  wall thickening in colon. There is no pericolic stranding. Vascular/Lymphatic: Extensive calcifications are seen in the abdominal aorta and its major branches. Some of the coarse calcifications are noted projecting into the lumen of abdominal aorta. Reproductive: There is evidence of previous surgical removal of prostate. There are multiple surgical clips in both sides of pelvis. Other: There is no ascites or pneumoperitoneum. Umbilical hernia containing fat is seen. Musculoskeletal: Degenerative changes are noted in both hips, more so on the right side. No definite focal lytic or sclerotic lesions are seen. IMPRESSION: Massive left pleural effusion. There are numerous nodules in the periphery of left pleural space suggesting malignant neoplastic process such as mesothelioma or pleural metastatic disease. There is compression atelectasis of visualized left lower lung fields. There is prominence of interstitial markings in the periphery of right lower lung fields suggesting scarring from interstitial lung disease. There is no evidence of  intestinal obstruction or pneumoperitoneum. Appendix is not dilated. There is no hydronephrosis. There is 7 mm nonobstructing right renal stone. There is 13 mm low-density lesion in the right lobe of liver which is not fully evaluated. This may suggest hemangioma or malignant neoplastic process such as metastatic disease. Other findings as described in the body of the report. Electronically Signed   By: Elmer Picker M.D.   On: 01/17/2022 20:06   DG CHEST PORT 1 VIEW  Result Date: 12/31/2021 CLINICAL DATA:  Pneumonia, s/P thoracentesis EXAM: PORTABLE CHEST - 1 VIEW COMPARISON:  12/08/2021 FINDINGS: No pneumothorax. Progressive peripheral consolidation in the left mid lung extending to the hilum. Blunting of the left lateral costophrenic angle suggesting small effusion. Right lung clear. Heart size and mediastinal contours are within normal limits. Aortic Atherosclerosis  (ICD10-170.0). Visualized bones unremarkable. IMPRESSION: 1. No pneumothorax. 2. Worsening peripheral left mid lung airspace consolidation with small effusion Electronically Signed   By: Lucrezia Europe M.D.   On: 12/31/2021 15:08       I have independently reviewed the above radiology studies  and reviewed the findings with the patient.   Recent Lab Findings: Lab Results  Component Value Date   WBC 10.8 (H) 01/18/2022   HGB 12.3 (L) 01/18/2022   HCT 37.3 (L) 01/18/2022   PLT 319 01/18/2022   GLUCOSE 95 01/17/2022   CHOL 74 01/16/2015   TRIG 99 01/16/2015   HDL 31 (L) 01/16/2015   LDLCALC 23 01/16/2015   ALT 16 01/17/2022   AST 36 01/17/2022   NA 140 01/17/2022   K 4.7 01/17/2022   CL 111 01/17/2022   CREATININE 0.82 01/17/2022   BUN 19 01/17/2022   CO2 22 01/17/2022   TSH 5.708 (H) 11/03/2014   INR 1.20 11/03/2014   HGBA1C 6.1 (H) 10/31/2014         Assessment / Plan:   81 year old male with current left pleural effusion.  There is some concern for pleural thickening on his abdominal CT.  We will obtain a CT chest, and tentatively plan for left thoracoscopy, decortication, pleural biopsy on 01/21/2022.      Lajuana Matte 01/20/2022 2:38 PM

## 2022-01-20 NOTE — TOC Progression Note (Addendum)
Transition of Care (TOC) - Progression Note    Patient Details  Name: Pedro Pope. MRN: [id number] Date of Birth: 01/06/1941  Transition of Care [facility]) CM/SW Contact  Zenon Mayo, RN Phone Number: 01/20/2022, 5:15 PM  Clinical Narrative:     Transition of Care [facility]) Screening Note   Patient Details  Name: Pedro Pope. Date of Birth: 06-29-1941   Transition of Care [facility]) CM/SW Contact:    Zenon Mayo, RN Phone Number: 01/20/2022, 5:15 PM    From home with wife, resp distress , for tap tomorrow, possibly may go home with pleurx drain, We will continue to monitor patient advancement through interdisciplinary progression rounds.           Expected Discharge Plan and Services                                                 Social Determinants of Health (SDOH) Interventions    Readmission Risk Interventions     View : No data to display.

## 2022-01-20 NOTE — Progress Notes (Signed)
NAME:  Pedro Yaffe., MRN:  [id number], DOB:  26-Feb-1941, LOS: 3 ADMISSION DATE:  01/17/2022, CONSULTATION DATE: January 17, 2022 REFERRING MD: Dr. Sherry Ruffing, CHIEF COMPLAINT: Dyspnea  History of Present Illness:  This is a very pleasant 81 year old male who has been followed by [facility] pulmonary for the last several weeks in the setting of a moderate to large left-sided pleural effusion.  The patient is a prior smoker who was diagnosed with pneumonia in late March and treated with antibiotics.  He had progressive dyspnea after this and by April 23 he was noted to have chest pain, coughing, and was treated for pneumonia.  He had persistent pleuritic pain and little resolution of symptoms so he had a CT chest which showed lymphadenopathy and effusion.  He was noted to be on Plavix which was held and plans were made for a thoracentesis which was performed on May 16.  1800 cc of amber appearing fluid was removed.  Postoperative imaging showed persistent opacification of the left lung and a small pleural effusion.  He has followed with pulmonary since the procedure.  He was treated with a course of Levaquin and doxycycline on separate occasions.  He continues to have some degree of dyspnea but it has been steadily worsening ever since the thoracentesis.  He now is unable to take several steps.  For the last 4 to 5 days he is also had increasing swelling and pain in his left flank.  He presented to the pulmonary office today was noted to be hypoxemic and was started on oxygen and sent to the emergency room for further evaluation.  Pertinent  Medical History  Coronary artery disease> status post LAD stent, on Plavix Former cigarette smoker, smoked 1-1/2 packs of cigarettes daily up until 1994, Prostate cancer Prediabetes Paroxysmal A-fib Fatty liver   Significant Hospital Events: Including procedures, antibiotic start and stop dates in addition to other pertinent events   June 2 admission 6/3 sitting up  91% with O2 off, states that his breathing is feeling improved 6/4 atrial fibrillation  Interim History / Subjective:  Breathing is stable today; supplemental oxygen has been helpful. Last plavix on Friday 6/2.  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 Active at baseline-- before his recent illness this spring he would have had no activity limitations.  Objective   Blood pressure (!) 105/54, pulse 70, temperature 97.9 F (36.6 C), temperature source Oral, resp. rate (!) 21, height 5\' 8"  (1.727 m), weight 61.6 kg, SpO2 95 %.        Intake/Output Summary (Last 24 hours) at 01/20/2022 0901 Last data filed at 01/19/2022 2042 Gross per 24 hour  Intake 559.36 ml  Output 200 ml  Net 359.36 ml    Filed Weights   01/17/22 2337 01/19/22 0302 01/20/22 0553  Weight: 62 kg 61.2 kg 61.6 kg    General:  elderly man lying in bed in NAD, appears younger than stated age HENT: [place]/AT, eyes anicteric PULM: breathing comfortably on 4L [place], reduced left basilar breath sounds CV: S1S2, RRR GI: soft, NT MSK: normal muscle mass, no clubbing or cyanosis. Left flank mass mobile, no associated bruising.  Neuro: awake, alert, normal speech, answering questions appropriately  H/H 12.3/37.2, down from ~14/42 in April  Pleural fluid studies from 12/31/2021: LD 218 Protein 3.6g WBC 670 (77% lymphs) Culture> NG Path> No malignant cells. Reactive mesothelial cells and chronic inflammatory cells present.  CT w/ contrast personally  reviewed>  left effusion with significant compression atelectasis.   Resolved Hospital Problem list     Assessment & Plan:  Acute respiratory failure with hypoxemia due to enlarging pleural effusion-- concern for hemothorax after recent thoracentesis despite having held plavix for his procedure.  Loculated pleural effusion on left- it was presumably originally parapneumonic but differential diagnosis is broad, especially since it was  lymphocyte predominant with negative cytology. Pleura on newest CT is significantly irregular, and previous CT was not ideal for assessing pleura without contrast. New flank mass/swelling> likely hematoma from tract of thoracentesis Left upper lobe infiltrate and mediastinal adenopathy> presumably pneumonia but in former smoker malignancy possible (had 25 years x 1.5ppd exposure) Coronary artery disease on Plavix- remote stent placement Paroxysmal atrial fibrillation  Plan: -TCTS consult for consideration of VATs vs chest tube drainage given concern for hemothorax. Direct examination of the pleura and potentially pleural biopsy may be helpful given irregular pleural appearance on contrasted CT scan. Can repeat dedicated chest CT if this would he helpful, but will hold off until discussed with TCTS. -con't to hold plavix for 5-day washout (last dose 6/2) -supplemental O2 to maintain SpO2 >90% -pulmonary toilet,  ambulate  PCCM will con't to follow.   Best Practice (right click and "Reselect all SmartList Selections" daily)   Per Morrison   Critical care time: Pulcifer Nathifa Ritthaler, DO 01/20/22 9:48 AM [facility]

## 2022-01-20 NOTE — Progress Notes (Addendum)
PROGRESS NOTE  Pedro Pope.  DOB: 04-21-1941  PCP: Shon Baton, MD LOV:[id number]  DOA: 01/17/2022  LOS: 3 days  Hospital Day: 4  Brief narrative: Pedro Pope. is a 81 y.o. male with PMH significant for CAD s/p DES to LAD 2016, ischemic cardiomyopathy, mild systolic CHF, paroxysmal A-fib, Dressler syndrome, prediabetes, prostate cancer s/p prostatectomy and radiation, fatty liver, gout. Patient presented to the ED on 6/2 with worsening shortness of breath. In April, patient was treated with multiple courses of antibiotics for pneumonia despite which she continued to have shortness of breath and pleuritic chest pain. 5/8, CT chest showed moderate loculated left pleural effusion. 5/16, he was seen by pulmonology as an outpatient and underwent thoracentesis.  1800 cc of amber appearing fluid was removed.  Chest x-ray postprocedure showed worsening peripheral left midlung airspace opacity with a small effusion. His symptoms continue to progress.  Now he has dyspnea on minimal exertion, has poor oral intake, he also noticed increasing swelling and pain to his left flank for the past 4 to 5 days.   6/2, he followed up at pulmonology office.  He was noted to hypoxic, started on oxygen and sent to the ED.  In the ED, patient was afebrile, respiratory rate in 20s, required up to 6 L oxygen by nasal cannula. Labs showed WBC count elevated 11.7, CMP unremarkable, BNP elevated to 192. CT abdomen/pelvis showed large left pleural effusion and peripheral pleural nodules on the left side suggesting malignant neoplastic process such as mesothelioma or pleural metastatic disease.  He was also noted to have a 13 mm lesion in the right lobe of the liver suspicious for hemangioma versus metastatic disease. Admitted to hospitalist service. Pulmonary consultation was obtained.  Subjective: Patient was seen and examined this morning.   Lying on bed.  Patient is on 6 L oxygen by nasal cannula.  Blood  pressure in 100s, heart rate 80s.  Son at bedside.   Yesterday morning, patient was in A-fib with RVR which initially improved with IV metoprolol push but recurred again and hence patient was started on Cardizem drip.  Patient converted to normal sinus rhythm in the afternoon.  I wanted to continue Cardizem overnight but because of low blood pressure last night, it was held.  Patient remains in normal sinus rhythm this morning.  Principal Problem:   Acute respiratory failure with hypoxia (HCC) Active Problems:   CAD S/P percutaneous coronary angioplasty   Pleural effusion on left   Hemothorax   History of prostate cancer   Left lower quadrant abdominal swelling    Assessment and Plan: Acute respiratory failure with hypoxia in the setting of enlarging pleural effusion -Presented with progressive shortness of breath for more than 2 months.  Underwent thoracentesis on 5/16 with recurrence again. -CT scan on admission also suggestive of peripheral pulmonary nodules suspicious for mesothelioma or metastasis.  Per pulmonology Dr. Lake Bells, cytology was negative from the fluid sample on 5/16.  Pulmonary follow-up appreciated.  Noted a plan to consult chest surgery because of hemothorax and possible need of pleural biopsy. -Currently Plavix is on hold.  Last dose 6/2.   -Currently on 6 L oxygen by nasal cannula. Continue to wean down as tolerated. -Incentive spirometry  A-fib with RVR History of paroxysmal A-fib -6/4, patient went to A-fib with RVR.  It initially improved with IV metoprolol push but recurred again and hence patient was started on Cardizem drip.  Patient converted to normal sinus rhythm in the afternoon.  I  wanted to continue Cardizem overnight but because of low blood pressure last night, it was held.  Patient remains in normal sinus rhythm this morning.  -Previously, patient was on Coreg 3.125 mg daily only.  I would increase the frequency to twice daily.  CAD S/P percutaneous  coronary angioplasty S/p DES to LAD in 2016 on Plavix  -PTA, patient was not Coreg, Plavix, statin.   -Currently continued on Coreg and statin.  Plavix on hold last dose on 6/2.  Left lower quadrant abdominal swelling -Per pulmonology, patient likely had tracking of hematoma from thoracentesis puncture site and collecting subcutaneously in the left flank.   -Continue Percocet, Tylenol as needed for pain  History of prostate cancer -s/p RRP and salave EBRT. Followed by oncology at [facility]. -has chronic hematuria likely due to to radiation cystitis.  Currently not bleeding   Goals of care   Code Status: Full Code    Mobility: Encourage ambulation  Skin assessment: None    Nutritional status:  Body mass index is 20.66 kg/m.          Diet:  Diet Order             Diet regular Room service appropriate? Yes; Fluid consistency: Thin  Diet effective now                   DVT prophylaxis: None SCDs Start: 01/17/22 2328   Antimicrobials: None Fluid: None Consultants: Pulmonology Family Communication: None at bedside  Status is: Inpatient  Continue in-hospital care because: Needs chest tube placement after Plavix washout Level of care: Progressive   Dispo: The patient is from: Home              Anticipated d/c is to: Pending clinical course              Patient currently is not medically stable to d/c.   Difficult to place patient No     Infusions:    Scheduled Meds:  ascorbic acid  500 mg Oral Daily   atorvastatin  40 mg Oral QPM   carvedilol  3.125 mg Oral BID WC   cholecalciferol  1,000 Units Oral Daily    PRN meds: acetaminophen, albuterol, lidocaine, oxyCODONE-acetaminophen   Antimicrobials: Anti-infectives (From admission, onward)    None       Objective: Vitals:   01/20/22 0553 01/20/22 0730  BP: (!) 104/53 (!) 105/54  Pulse: 71 70  Resp: 20 (!) 21  Temp: 97.9 F (36.6 C)   SpO2: 91% 95%    Intake/Output Summary (Last 24 hours)  at 01/20/2022 1024 Last data filed at 01/19/2022 2042 Gross per 24 hour  Intake 559.36 ml  Output 200 ml  Net 359.36 ml    Filed Weights   01/17/22 2337 01/19/22 0302 01/20/22 0553  Weight: 62 kg 61.2 kg 61.6 kg   Weight change: 0.454 kg Body mass index is 20.66 kg/m.   Physical Exam: General exam: Pleasant, elderly Caucasian male. Skin: No rashes, lesions or ulcers. HEENT: Atraumatic, normocephalic, no obvious bleeding Lungs: Diminished air entry in left hemithorax. Right side clear. CVS: Regular rate and rhythm, no murmur. GI/Abd soft, and area of swelling and tenderness in left flank, nondistended, bowel sound present CNS: Alert, awake, oriented x3 Psychiatry: Mood appropriate Extremities: No pedal edema, no calf tenderness  Data Review: I have personally reviewed the laboratory data and studies available.  F/u labs ordered [employer] (From admission, onward)     Start  Ordered   01/17/22 1611  Urine Culture  Once,   URGENT       Question:  Indication  Answer:  Flank Pain   01/17/22 1612   01/17/22 1610  Urinalysis, Routine w reflex microscopic  Once,   URGENT        01/17/22 1612            Signed, Terrilee Croak, MD [facility] 01/20/2022

## 2022-01-20 NOTE — Care Management Important Message (Signed)
Important Message  Patient Details  Name: Pedro Pope. MRN: [id number] Date of Birth: Aug 11, 1941   Medicare Important Message Given:  Yes     [name] 01/20/2022, 10:49 AM

## 2022-01-21 ENCOUNTER — Inpatient Hospital Stay (HOSPITAL_COMMUNITY): Payer: Medicare Other | Admitting: Certified Registered Nurse Anesthetist

## 2022-01-21 ENCOUNTER — Encounter (HOSPITAL_COMMUNITY)
Admission: EM | Disposition: A | Discharge status: Home / Self Care | Payer: Self-pay | Source: Home / Self Care | Attending: Internal Medicine

## 2022-01-21 ENCOUNTER — Encounter (HOSPITAL_COMMUNITY): Payer: Self-pay | Admitting: Family Medicine

## 2022-01-21 ENCOUNTER — Other Ambulatory Visit: Payer: Self-pay

## 2022-01-21 DIAGNOSIS — J910 Malignant pleural effusion: Secondary | ICD-10-CM

## 2022-01-21 DIAGNOSIS — C782 Secondary malignant neoplasm of pleura: Secondary | ICD-10-CM | POA: Diagnosis not present

## 2022-01-21 DIAGNOSIS — C801 Malignant (primary) neoplasm, unspecified: Secondary | ICD-10-CM | POA: Diagnosis not present

## 2022-01-21 DIAGNOSIS — I2510 Atherosclerotic heart disease of native coronary artery without angina pectoris: Secondary | ICD-10-CM

## 2022-01-21 DIAGNOSIS — J9601 Acute respiratory failure with hypoxia: Secondary | ICD-10-CM | POA: Diagnosis not present

## 2022-01-21 HISTORY — PX: VIDEO ASSISTED THORACOSCOPY (VATS)/DECORTICATION: SHX6171

## 2022-01-21 LAB — SURGICAL PCR SCREEN
MRSA, PCR: NEGATIVE
Staphylococcus aureus: NEGATIVE

## 2022-01-21 SURGERY — VIDEO ASSISTED THORACOSCOPY (VATS)/DECORTICATION
Anesthesia: General | Laterality: Left

## 2022-01-21 MED ORDER — LIDOCAINE 2% (20 MG/ML) 5 ML SYRINGE
INTRAMUSCULAR | Status: AC
  Filled 2022-01-21: qty 5

## 2022-01-21 MED ORDER — DEXAMETHASONE SODIUM PHOSPHATE 10 MG/ML IJ SOLN
INTRAMUSCULAR | Status: DC | PRN
  Administered 2022-01-21: 10 mg via INTRAVENOUS

## 2022-01-21 MED ORDER — ONDANSETRON HCL 4 MG/2ML IJ SOLN
INTRAMUSCULAR | Status: AC
  Filled 2022-01-21: qty 2

## 2022-01-21 MED ORDER — ACETAMINOPHEN 500 MG PO TABS
1000.0000 mg | ORAL_TABLET | Freq: Once | ORAL | Status: AC
  Administered 2022-01-21: 1000 mg via ORAL

## 2022-01-21 MED ORDER — LACTATED RINGERS IV SOLN: INTRAVENOUS | Status: DC

## 2022-01-21 MED ORDER — AMISULPRIDE (ANTIEMETIC) 5 MG/2ML IV SOLN
10.0000 mg | Freq: Once | INTRAVENOUS | Status: DC | PRN
Start: 2022-01-21 — End: 2022-01-21

## 2022-01-21 MED ORDER — VANCOMYCIN HCL 1000 MG IV SOLR
1000.0000 mg | Freq: Once | INTRAVENOUS | Status: DC
  Administered 2022-01-21: 1000 mg via INTRAVENOUS
  Filled 2022-01-21: qty 20

## 2022-01-21 MED ORDER — VANCOMYCIN HCL IN DEXTROSE 1-5 GM/200ML-% IV SOLN
1000.0000 mg | INTRAVENOUS | Status: DC
  Filled 2022-01-21: qty 200

## 2022-01-21 MED ORDER — 0.9 % SODIUM CHLORIDE (POUR BTL) OPTIME
TOPICAL | Status: DC | PRN
  Administered 2022-01-21: 1000 mL

## 2022-01-21 MED ORDER — ALBUMIN HUMAN 5 % IV SOLN: INTRAVENOUS | Status: DC | PRN

## 2022-01-21 MED ORDER — LACTATED RINGERS IV SOLN: INTRAVENOUS | Status: DC | PRN

## 2022-01-21 MED ORDER — DEXAMETHASONE SODIUM PHOSPHATE 10 MG/ML IJ SOLN
INTRAMUSCULAR | Status: AC
  Filled 2022-01-21: qty 1

## 2022-01-21 MED ORDER — PROPOFOL 10 MG/ML IV BOLUS
INTRAVENOUS | Status: AC
  Filled 2022-01-21: qty 20

## 2022-01-21 MED ORDER — BUPIVACAINE HCL (PF) 0.5 % IJ SOLN
INTRAMUSCULAR | Status: AC
  Filled 2022-01-21: qty 30

## 2022-01-21 MED ORDER — FENTANYL CITRATE (PF) 100 MCG/2ML IJ SOLN: 25.0000 ug | INTRAMUSCULAR | Status: DC | PRN

## 2022-01-21 MED ORDER — PHENYLEPHRINE 80 MCG/ML (10ML) SYRINGE FOR IV PUSH (FOR BLOOD PRESSURE SUPPORT)
PREFILLED_SYRINGE | INTRAVENOUS | Status: DC | PRN
  Administered 2022-01-21: 160 ug via INTRAVENOUS

## 2022-01-21 MED ORDER — ONDANSETRON HCL 4 MG/2ML IJ SOLN
INTRAMUSCULAR | Status: DC | PRN
  Administered 2022-01-21: 4 mg via INTRAVENOUS

## 2022-01-21 MED ORDER — PHENYLEPHRINE HCL-NACL 20-0.9 MG/250ML-% IV SOLN
INTRAVENOUS | Status: DC | PRN
  Administered 2022-01-21: 25 ug/min via INTRAVENOUS

## 2022-01-21 MED ORDER — ORAL CARE MOUTH RINSE: 15.0000 mL | Freq: Once | OROMUCOSAL | Status: AC

## 2022-01-21 MED ORDER — CHLORHEXIDINE GLUCONATE 0.12 % MT SOLN
OROMUCOSAL | Status: AC
  Administered 2022-01-21: 15 mL via OROMUCOSAL
  Filled 2022-01-21: qty 15

## 2022-01-21 MED ORDER — BUPIVACAINE HCL 0.5 % IJ SOLN
INTRAMUSCULAR | Status: DC | PRN
  Administered 2022-01-21: 30 mL

## 2022-01-21 MED ORDER — FENTANYL CITRATE (PF) 250 MCG/5ML IJ SOLN
INTRAMUSCULAR | Status: DC | PRN
  Administered 2022-01-21: 100 ug via INTRAVENOUS

## 2022-01-21 MED ORDER — FENTANYL CITRATE (PF) 250 MCG/5ML IJ SOLN
INTRAMUSCULAR | Status: AC
  Filled 2022-01-21: qty 5

## 2022-01-21 MED ORDER — EPHEDRINE SULFATE-NACL 50-0.9 MG/10ML-% IV SOSY
PREFILLED_SYRINGE | INTRAVENOUS | Status: DC | PRN
  Administered 2022-01-21: 10 mg via INTRAVENOUS

## 2022-01-21 MED ORDER — CHLORHEXIDINE GLUCONATE 0.12 % MT SOLN: 15.0000 mL | Freq: Once | OROMUCOSAL | Status: AC

## 2022-01-21 MED ORDER — VANCOMYCIN HCL 1000 MG IV SOLR
INTRAVENOUS | Status: AC
Start: 2022-01-21 — End: ?
  Filled 2022-01-21: qty 20

## 2022-01-21 MED ORDER — LIDOCAINE 2% (20 MG/ML) 5 ML SYRINGE
INTRAMUSCULAR | Status: DC | PRN
  Administered 2022-01-21: 40 mg via INTRAVENOUS

## 2022-01-21 MED ORDER — SUGAMMADEX SODIUM 200 MG/2ML IV SOLN
INTRAVENOUS | Status: DC | PRN
  Administered 2022-01-21: 200 mg via INTRAVENOUS

## 2022-01-21 MED ORDER — ROCURONIUM BROMIDE 10 MG/ML (PF) SYRINGE
PREFILLED_SYRINGE | INTRAVENOUS | Status: AC
  Filled 2022-01-21: qty 10

## 2022-01-21 MED ORDER — BUPIVACAINE LIPOSOME 1.3 % IJ SUSP
INTRAMUSCULAR | Status: AC
  Filled 2022-01-21: qty 20

## 2022-01-21 MED ORDER — ROCURONIUM BROMIDE 10 MG/ML (PF) SYRINGE
PREFILLED_SYRINGE | INTRAVENOUS | Status: DC | PRN
  Administered 2022-01-21: 50 mg via INTRAVENOUS
  Administered 2022-01-21: 10 mg via INTRAVENOUS

## 2022-01-21 MED ORDER — PROPOFOL 10 MG/ML IV BOLUS
INTRAVENOUS | Status: DC | PRN
  Administered 2022-01-21: 80 mg via INTRAVENOUS

## 2022-01-21 SURGICAL SUPPLY — 87 items
APPLICATOR TIP COSEAL (VASCULAR PRODUCTS) IMPLANT
APPLICATOR TIP EXT COSEAL (VASCULAR PRODUCTS) IMPLANT
BLADE CLIPPER SURG (BLADE) IMPLANT
BLADE SURG 11 STRL SS (BLADE) ×2 IMPLANT
BLADE SURG 15 STRL LF DISP TIS (BLADE) IMPLANT
BLADE SURG 15 STRL SS (BLADE) ×1
CANISTER SUCT 3000ML PPV (MISCELLANEOUS) ×2 IMPLANT
CATH THORACIC 28FR (CATHETERS) ×1 IMPLANT
CATH THORACIC 36FR (CATHETERS) IMPLANT
CATH THORACIC 36FR RT ANG (CATHETERS) IMPLANT
CHLORAPREP W/TINT 26 (MISCELLANEOUS) ×2 IMPLANT
CLEANER TIP ELECTROSURG 2X2 (MISCELLANEOUS) IMPLANT
CNTNR URN SCR LID CUP LEK RST (MISCELLANEOUS) ×2 IMPLANT
CONN ST 1/4X3/8  BEN (MISCELLANEOUS)
CONN ST 1/4X3/8 BEN (MISCELLANEOUS) IMPLANT
CONN Y 3/8X3/8X3/8  BEN (MISCELLANEOUS)
CONN Y 3/8X3/8X3/8 BEN (MISCELLANEOUS) IMPLANT
CONT SPEC 4OZ STRL OR WHT (MISCELLANEOUS) ×2
COVER SURGICAL LIGHT HANDLE (MISCELLANEOUS) IMPLANT
DEFOGGER SCOPE WARMER CLEARIFY (MISCELLANEOUS) ×3 IMPLANT
DERMABOND ADVANCED (GAUZE/BANDAGES/DRESSINGS) ×1
DERMABOND ADVANCED .7 DNX12 (GAUZE/BANDAGES/DRESSINGS) ×1 IMPLANT
DISSECTOR BLUNT TIP ENDO 5MM (MISCELLANEOUS) IMPLANT
DRAIN CHANNEL 28F RND 3/8 FF (WOUND CARE) IMPLANT
ELECT BLADE 4.0 EZ CLEAN MEGAD (MISCELLANEOUS) ×2
ELECT REM PT RETURN 9FT ADLT (ELECTROSURGICAL) ×2
ELECTRODE BLDE 4.0 EZ CLN MEGD (MISCELLANEOUS) ×1 IMPLANT
ELECTRODE REM PT RTRN 9FT ADLT (ELECTROSURGICAL) ×1 IMPLANT
GAUZE 4X4 16PLY ~~LOC~~+RFID DBL (SPONGE) ×2 IMPLANT
GAUZE SPONGE 4X4 12PLY STRL (GAUZE/BANDAGES/DRESSINGS) ×2 IMPLANT
GLOVE BIO SURGEON STRL SZ7.5 (GLOVE) ×4 IMPLANT
GOWN STRL REUS W/ TWL LRG LVL3 (GOWN DISPOSABLE) ×3 IMPLANT
GOWN STRL REUS W/ TWL XL LVL3 (GOWN DISPOSABLE) ×1 IMPLANT
GOWN STRL REUS W/TWL LRG LVL3 (GOWN DISPOSABLE) ×3
GOWN STRL REUS W/TWL XL LVL3 (GOWN DISPOSABLE) ×1
KIT BASIN OR (CUSTOM PROCEDURE TRAY) ×2 IMPLANT
KIT PLEURX DRAIN CATH 15.5FR (DRAIN) ×1 IMPLANT
KIT SUCTION CATH 14FR (SUCTIONS) ×2 IMPLANT
KIT TURNOVER KIT B (KITS) ×2 IMPLANT
NDL 18GX1X1/2 (RX/OR ONLY) (NEEDLE) ×1 IMPLANT
NEEDLE 18GX1X1/2 (RX/OR ONLY) (NEEDLE) ×2 IMPLANT
NS IRRIG 1000ML POUR BTL (IV SOLUTION) ×4 IMPLANT
PACK CHEST (CUSTOM PROCEDURE TRAY) ×2 IMPLANT
PACK UNIVERSAL I (CUSTOM PROCEDURE TRAY) ×2 IMPLANT
PAD ARMBOARD 7.5X6 YLW CONV (MISCELLANEOUS) ×4 IMPLANT
PASSER SUT SWANSON 36MM LOOP (INSTRUMENTS) IMPLANT
SCISSORS LAP 5X35 DISP (ENDOMECHANICALS) IMPLANT
SEALANT SURG COSEAL 4ML (VASCULAR PRODUCTS) IMPLANT
SEALANT SURG COSEAL 8ML (VASCULAR PRODUCTS) IMPLANT
SPONGE T-LAP 18X18 ~~LOC~~+RFID (SPONGE) ×8 IMPLANT
STOPCOCK 4 WAY LG BORE MALE ST (IV SETS) IMPLANT
SUT PROLENE 3 0 SH DA (SUTURE) IMPLANT
SUT PROLENE 4 0 RB 1 (SUTURE)
SUT PROLENE 4-0 RB1 .5 CRCL 36 (SUTURE) IMPLANT
SUT SILK  1 MH (SUTURE) ×1
SUT SILK 1 MH (SUTURE) ×1 IMPLANT
SUT SILK 1 TIES 10X30 (SUTURE) IMPLANT
SUT SILK 2 0SH CR/8 30 (SUTURE) IMPLANT
SUT SILK 3 0SH CR/8 30 (SUTURE) IMPLANT
SUT VIC AB 1 CTX 18 (SUTURE) IMPLANT
SUT VIC AB 1 CTX 36 (SUTURE)
SUT VIC AB 1 CTX36XBRD ANBCTR (SUTURE) IMPLANT
SUT VIC AB 2-0 CT1 27 (SUTURE) ×1
SUT VIC AB 2-0 CT1 TAPERPNT 27 (SUTURE) ×1 IMPLANT
SUT VIC AB 2-0 CTX 36 (SUTURE) IMPLANT
SUT VIC AB 3-0 SH 27 (SUTURE) ×2
SUT VIC AB 3-0 SH 27X BRD (SUTURE) ×1 IMPLANT
SUT VIC AB 3-0 X1 27 (SUTURE) IMPLANT
SUT VICRYL 0 UR6 27IN ABS (SUTURE) ×3 IMPLANT
SUT VICRYL 2 TP 1 (SUTURE) IMPLANT
SWAB COLLECTION DEVICE MRSA (MISCELLANEOUS) IMPLANT
SWAB CULTURE ESWAB REG 1ML (MISCELLANEOUS) IMPLANT
SYR 10ML LL (SYRINGE) IMPLANT
SYR 20ML LL LF (SYRINGE) ×2 IMPLANT
SYR 50ML LL SCALE MARK (SYRINGE) IMPLANT
SYR CONTROL 10ML LL (SYRINGE) ×1 IMPLANT
SYSTEM SAHARA CHEST DRAIN ATS (WOUND CARE) ×2 IMPLANT
TAPE CLOTH 4X10 WHT NS (GAUZE/BANDAGES/DRESSINGS) ×2 IMPLANT
TAPE UMBILICAL COTTON 1/8X30 (MISCELLANEOUS) ×2 IMPLANT
TIP APPLICATOR SPRAY EXTEND 16 (VASCULAR PRODUCTS) IMPLANT
TOWEL GREEN STERILE (TOWEL DISPOSABLE) ×4 IMPLANT
TRAP SPECIMEN MUCUS 40CC (MISCELLANEOUS) ×2 IMPLANT
TRAY FOLEY SLVR 16FR LF STAT (SET/KITS/TRAYS/PACK) ×2 IMPLANT
TROCAR XCEL 12X100 BLDLESS (ENDOMECHANICALS) IMPLANT
TUBING EXTENTION W/L.L. (IV SETS) IMPLANT
TUBING LAP HI FLOW INSUFFLATIO (TUBING) IMPLANT
WATER STERILE IRR 1000ML POUR (IV SOLUTION) ×2 IMPLANT

## 2022-01-21 NOTE — Progress Notes (Addendum)
NAME:  Pedro Venuto., MRN:  [id number], DOB:  [birth year], LOS: 4 ADMISSION DATE:  01/17/2022, CONSULTATION DATE: January 17, 2022 REFERRING MD: Dr. Sherry Ruffing, CHIEF COMPLAINT: Dyspnea  History of Present Illness:  This is a very pleasant 81 year old male who has been followed by [facility] pulmonary for the last several weeks in the setting of a moderate to large left-sided pleural effusion.  The patient is a prior smoker who was diagnosed with pneumonia in late March and treated with antibiotics.  He had progressive dyspnea after this and by April 23 he was noted to have chest pain, coughing, and was treated for pneumonia.  He had persistent pleuritic pain and little resolution of symptoms so he had a CT chest which showed lymphadenopathy and effusion.  He was noted to be on Plavix which was held and plans were made for a thoracentesis which was performed on May 16.  1800 cc of amber appearing fluid was removed.  Postoperative imaging showed persistent opacification of the left lung and a small pleural effusion.  He has followed with pulmonary since the procedure.  He was treated with a course of Levaquin and doxycycline on separate occasions.  He continues to have some degree of dyspnea but it has been steadily worsening ever since the thoracentesis.  He now is unable to take several steps.  For the last 4 to 5 days he is also had increasing swelling and pain in his left flank.  He presented to the pulmonary office today was noted to be hypoxemic and was started on oxygen and sent to the emergency room for further evaluation.  Pertinent  Medical History  Coronary artery disease> status post LAD stent, on Plavix Former cigarette smoker, smoked 1-1/2 packs of cigarettes daily up until 1994, Prostate cancer Prediabetes Paroxysmal A-fib Fatty liver   Significant Hospital Events: Including procedures, antibiotic start and stop dates in addition to other pertinent events   June 2 admission 6/3 sitting up  91% with O2 off, states that his breathing is feeling improved 6/4 atrial fibrillation  Interim History / Subjective:  Breathing is stable today; supplemental oxygen has been helpful. Last plavix on Friday 6/2.  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 Active at baseline-- before his recent illness this spring he would have had no activity limitations.  Objective   Blood pressure 103/61, pulse 70, temperature 98.1 F (36.7 C), temperature source Oral, resp. rate 18, height 5\' 8"  (1.727 m), weight 61.6 kg, SpO2 95 %.        Intake/Output Summary (Last 24 hours) at 01/21/2022 1127 Last data filed at 01/21/2022 0800 Gross per 24 hour  Intake 440 ml  Output 0 ml  Net 440 ml    Filed Weights   01/17/22 2337 01/19/22 0302 01/20/22 0553  Weight: 62 kg 61.2 kg 61.6 kg    General:  elderly man lying in bed in NAD, appears younger than stated age HENT: [place]/AT, eyes anicteric PULM: breathing comfortably on [place], reduced left basilar breath sounds CV: S1S2, RRR GI: soft, NT MSK: normal muscle mass, no clubbing or cyanosis. Left flank mass mobile, no associated bruising.  Neuro: awake, alert, normal speech, answering questions appropriately  H/H 12.3/37.2, down from ~14/42 in April  Pleural fluid studies from 12/31/2021: LD 218 Protein 3.6g WBC 670 (77% lymphs) Culture> NG Path> No malignant cells. Reactive mesothelial cells and chronic inflammatory cells present.  CT w/ contrast personally  reviewed> left effusion with  significant compression atelectasis, contrasted images with irregular, nodular pleura with some heterogeneity in density  Resolved Hospital Problem list     Assessment & Plan:  Acute respiratory failure with hypoxemia due to enlarging pleural effusion-- concern for hemothorax after recent thoracentesis despite having held plavix for his procedure.  Loculated pleural effusion on left- it was presumably originally parapneumonic but  differential diagnosis is broad, especially since it was lymphocyte predominant with negative cytology. Pleura on newest CT is significantly irregular, and previous CT was not ideal for assessing pleura without contrast. New flank mass/swelling> likely hematoma from tract of thoracentesis Left upper lobe mass and mediastinal adenopathy> treated for pneumonia but imaging has worsened, former smoker (had 25 years x 1.5ppd exposure) Coronary artery disease on Plavix- remote stent placement Paroxysmal atrial fibrillation  Plan: --High suspicion for malignant effusion --flank mass could be hematoma but no sign of bruising or mobilization of hematoma, if appearance of fluid appears consistent with hemothorax then would likely confirm hematoma --VATS today for biopsy, high suspicion for malignancy, favor lung primary but pleural based malignancy possible, discussed at length with patient and family at bedside  PCCM will con't to follow.   Best Practice (right click and "Reselect all SmartList Selections" daily)   Per [place]   Critical care time: n/a     Lanier Clam, MD  01/21/22 11:27 AM [facility]

## 2022-01-21 NOTE — Op Note (Signed)
     [address]       [place] [address]             [phone]       01/21/2022 Patient:  Pedro Pope. Pre-Op Dx: Recurrent left pleural effusion Post-op Dx: Pleural metastasis. Procedure: -Left video assisted thoracoscopy -Pleural biopsy -Placement of Pleurx catheter   Surgeon and Role:      * Dasha Kawabata, Lucile Crater, MD - Primary        Anesthesia  general EBL: 10 ml Blood Administration: None Specimen: Pleural biopsy  Drains: Pleurx catheter Counts: correct    Indications: This is an 81 year old gentleman that presents to the hospital with recurrent left-sided pleural effusion.  He does have a significant smoking history and on cross-sectional imaging he was noted to have some pleural studding.  He is undergone a thoracentesis which has been negative for any malignancy but due to concern for pleural disease he was brought to the operating theater for pleural biopsy.  Findings: Pleural nodules and carcinomatosis was noted.  The frozen section was positive for cancer.  Both lobes were completely encased in tumor.  We drained over 3 L serous fluid from the pleural space.  Operative Technique: After the risks, benefits and alternatives were thoroughly discussed, the patient was brought to the operative theatre.  Anesthesia was induced, the patient was then placed in a right decubitus position and was prepped and draped in normal sterile fashion.  An appropriate surgical pause was performed, and pre-operative antibiotics were dosed accordingly.  We began with 2cm incision in the anterior axillary line at the 5th intercostal space.  The chest was entered, and on digital palpation there were nodules noted along the pleural surface.  We drained over 3 L of serous fluid from the pleural space.  We then placed a 1cm incision at the 10th intercostal space, and introduced our camera port.  The lung was directly visualized.  Both lobes were completely encased in  tumor.  Pleural biopsy was performed and sent off for frozen section.   Results were consistent with carcinoma.    A Pleurx catheter was then tunneled under the skin and passed into the chest under direct visualization.  The skin and soft tissue were closed with absorbable suture    The patient tolerated the procedure without any immediate complications, and was transferred to the PACU in stable condition.  [name]

## 2022-01-21 NOTE — Progress Notes (Signed)
Mobility Specialist Progress Note:   01/21/22 1210  Mobility  Activity Off unit   Will follow-up as time allows.   [facility] Mikai Meints Mobility Specialist

## 2022-01-21 NOTE — Progress Notes (Signed)
Placed Pt on HFNC 5L due to increase oxygen demand while ambulating. Pt states he feels fine and is not on any respiratory distress. RT will continue to monitor for any changes.

## 2022-01-21 NOTE — Progress Notes (Signed)
     [address]       [place] [address]             [phone]       No events Vitals:   01/21/22 1145 01/21/22 1152  BP: (!) 125/102 106/74  Pulse: 80 76  Resp: 18   Temp: 97.8 F (36.6 C)   SpO2: 95%    Alert NAD Sinus EWOB  OR today for L VATS decortication  Devra Stare O Marcene Laskowski

## 2022-01-21 NOTE — Op Note (Incomplete)
     [address]       [place] [address]             [phone]       01/21/2022 Patient:  [name] Pre-Op Dx: ***   Post-op Dx:  *** Procedure: - *** Video assisted thoracoscopy - Decortication - Intercostal nerve block  Surgeon and Role:      * Lajuana Matte, MD - Primary    * *** - assisting An experienced assistant was required given the complexity of this surgery and the standard of surgical care. The assistant was needed for exposure, dissection, suctioning, retraction of delicate tissues and sutures, instrument exchange and for overall help during this procedure.     Anesthesia  general EBL:  ***ml Blood Administration: *** Specimen: ***  Drains: 35 F argyle chest tube in *** chest Counts: correct    Indications: *** Findings: ***  Operative Technique: After the risks, benefits and alternatives were thoroughly discussed, the patient was brought to the operative theatre.  Anesthesia was induced, the patient was then placed in a *** decubitus position and was prepped and draped in normal sterile fashion.  An appropriate surgical pause was performed, and pre-operative antibiotics were dosed accordingly.  We began with 2cm incision in the anterior axillary line at the 5th intercostal space.  The chest was entered, and we then placed a 1cm incision at the 10th intercostal space, and introduced our camera port.  The lung was directly visualized.  ***.   The lung was then mobilized off of the chest wall.  ***.  The pleural peal was carefully decorticated off of each lobe.  The fissure was then mobilized.  We achieved good expansion of the lung.  The chest was then irrigated.    An intercostal nerve block was performed under direct visualization.  A 28 F chest tube was then placed, and we watch the lung re-expand.  The skin and soft tissue were closed with absorbable suture    The patient tolerated the procedure without any immediate  complications, and was transferred to the PACU in stable condition.  Sora Vrooman Bary Leriche

## 2022-01-21 NOTE — Anesthesia Procedure Notes (Signed)
Arterial Line Insertion Start/End6/01/2022 12:16 PM, 01/21/2022 12:16 PM Performed by: Renato Shin, CRNA, CRNA  Patient location: Pre-op. Preanesthetic checklist: patient identified, IV checked, site marked, risks and benefits discussed, surgical consent, monitors and equipment checked, pre-op evaluation, timeout performed and anesthesia consent Lidocaine 1% used for infiltration Right, radial was placed Catheter size: 20 G Hand hygiene performed , maximum sterile barriers used  and Seldinger technique used Allen's test indicative of satisfactory collateral circulation Attempts: 1 Procedure performed without using ultrasound guided technique. Following insertion, dressing applied and Biopatch. Post procedure assessment: normal  Patient tolerated the procedure well with no immediate complications.

## 2022-01-21 NOTE — Anesthesia Procedure Notes (Addendum)
Procedure Name: Intubation Date/Time: 01/21/2022 1:35 PM Performed by: Harden Mo, CRNA Pre-anesthesia Checklist: Patient identified, Emergency Drugs available, Suction available and Patient being monitored Patient Re-evaluated:Patient Re-evaluated prior to induction Oxygen Delivery Method: Circle System Utilized Preoxygenation: Pre-oxygenation with 100% oxygen Induction Type: IV induction Ventilation: Mask ventilation without difficulty Laryngoscope Size: Miller and 2 Grade View: Grade I Endobronchial tube: Double lumen EBT, EBT position confirmed by auscultation and EBT position confirmed by fiberoptic bronchoscope and 39 Fr Number of attempts: 1 Airway Equipment and Method: Stylet and Oral airway Placement Confirmation: ETT inserted through vocal cords under direct vision, positive ETCO2 and breath sounds checked- equal and bilateral Tube secured with: Tape Dental Injury: Teeth and Oropharynx as per pre-operative assessment

## 2022-01-21 NOTE — Plan of Care (Signed)

## 2022-01-21 NOTE — Transfer of Care (Signed)
Immediate Anesthesia Transfer of Care Note  Patient: Pedro Pope.  Procedure(s) Performed: VIDEO ASSISTED THORACOSCOPY (VATS)/DECORTICATION (Left)  Patient Location: PACU  Anesthesia Type:General  Level of Consciousness: drowsy  Airway & Oxygen Therapy: Patient Spontanous Breathing and Patient connected to nasal cannula oxygen  Post-op Assessment: Report given to RN and Post -op Vital signs reviewed and stable  Post vital signs: Reviewed and stable  Last Vitals:  Vitals Value Taken Time  BP 101/46 01/21/22 1500  Temp    Pulse 60 01/21/22 1506  Resp 18 01/21/22 1506  SpO2 86 % 01/21/22 1506  Vitals shown include unvalidated device data.  Last Pain:  Vitals:   01/21/22 1152  TempSrc:   PainSc: 0-No pain      Patients Stated Pain Goal: 2 (47/34/03 7096)  Complications: No notable events documented.

## 2022-01-21 NOTE — Progress Notes (Signed)
PROGRESS NOTE  Pedro Pope.  DOB: Jan 12, 1941  PCP: Shon Baton, MD NWG:[id number]  DOA: 01/17/2022  LOS: 4 days  Hospital Day: 5  Brief narrative: Pedro Tackitt. is a 81 y.o. male with PMH significant for CAD s/p DES to LAD 2016, ischemic cardiomyopathy, mild systolic CHF, paroxysmal A-fib, Dressler syndrome, prediabetes, prostate cancer s/p prostatectomy and radiation, fatty liver, gout. Patient presented to the ED on 6/2 with worsening shortness of breath. In April, patient was treated with multiple courses of antibiotics for pneumonia despite which she continued to have shortness of breath and pleuritic chest pain. 5/8, CT chest showed moderate loculated left pleural effusion. 5/16, he was seen by pulmonology as an outpatient and underwent thoracentesis.  1800 cc of amber appearing fluid was removed.  Chest x-ray postprocedure showed worsening peripheral left midlung airspace opacity with a small effusion. His symptoms continue to progress.  Now he has dyspnea on minimal exertion, has poor oral intake, he also noticed increasing swelling and pain to his left flank for the past 4 to 5 days.   6/2, he followed up at pulmonology office.  He was noted to hypoxic, started on oxygen and sent to the ED.  In the ED, patient was afebrile, respiratory rate in 20s, required up to 6 L oxygen by nasal cannula. Labs showed WBC count elevated 11.7, CMP unremarkable, BNP elevated to 192. CT abdomen/pelvis showed large left pleural effusion and peripheral pleural nodules on the left side suggesting malignant neoplastic process such as mesothelioma or pleural metastatic disease.  He was also noted to have a 13 mm lesion in the right lobe of the liver suspicious for hemangioma versus metastatic disease. Admitted to hospitalist service. Pulmonary consultation was obtained.  Subjective: Patient was seen and examined this morning.   Lying down in bed.  On 4 L oxygen by nasal collar.  Multiple family  members at bedside.  He is waiting for VATS today.  Principal Problem:   Acute respiratory failure with hypoxia (HCC) Active Problems:   CAD S/P percutaneous coronary angioplasty   Pleural effusion on left   Hemothorax   History of prostate cancer   Left lower quadrant abdominal swelling    Assessment and Plan: Acute respiratory failure with hypoxia in the setting of enlarging pleural effusion -Presented with progressive shortness of breath for more than 2 months.  Underwent thoracentesis on 5/16 with negative cytology. -CT scan on admission showed large left pleural effusion and was also suggestive of peripheral pulmonary nodules suspicious for mesothelioma or metastasis.   -PCCM and CT surgery consulted.   -Pending VATS today. -Continue to monitor  A-fib with RVR History of paroxysmal A-fib -6/4, patient went to A-fib with RVR.  Controlled with Cardizem drip.  Currently on Coreg 3.125 mg twice daily.  Continue to monitor  CAD S/P percutaneous coronary angioplasty S/p DES to LAD in 2016 on Plavix  -PTA, patient was not Coreg, Plavix, statin.   -Currently continued on Coreg and statin.  Plavix on hold last dose on 6/2.  Left lower quadrant abdominal swelling -Per pulmonology, patient likely had tracking of hematoma from thoracentesis puncture site and collecting subcutaneously in the left flank.   -Continue Percocet, Tylenol as needed for pain  History of prostate cancer -s/p RRP and salave EBRT. Followed by oncology at [facility]. -has chronic hematuria likely due to to radiation cystitis.  Currently not bleeding   Goals of care   Code Status: Full Code    Mobility: Encourage ambulation  Skin  assessment: None    Nutritional status:  Body mass index is 20.66 kg/m.          Diet:  Diet Order             Diet regular Room service appropriate? Yes; Fluid consistency: Thin  Diet effective now                   DVT prophylaxis: None SCDs Start: 01/17/22  2328   Antimicrobials: None Fluid: None Consultants: Pulmonology, CT surgery Family Communication: None at bedside  Status is: Inpatient  Continue in-hospital care because: VATS today for biopsy, Level of care: Progressive   Dispo: The patient is from: Home              Anticipated d/c is to: Pending clinical course              Patient currently is not medically stable to d/c.   Difficult to place patient No     Infusions:   lactated ringers      Scheduled Meds:  acetaminophen  1,000 mg Oral Once   [MAR Hold] ascorbic acid  500 mg Oral Daily   [MAR Hold] atorvastatin  40 mg Oral QPM   [MAR Hold] carvedilol  3.125 mg Oral BID WC   chlorhexidine  15 mL Mouth/Throat Once   Or   mouth rinse  15 mL Mouth Rinse Once   chlorhexidine       [MAR Hold] cholecalciferol  1,000 Units Oral Daily    PRN meds: [MAR Hold] acetaminophen, [MAR Hold] albuterol, [MAR Hold] lidocaine, [MAR Hold] oxyCODONE-acetaminophen   Antimicrobials: Anti-infectives (From admission, onward)    None       Objective: Vitals:   01/21/22 1145 01/21/22 1152  BP: (!) 125/102 106/74  Pulse: 80 76  Resp: 18   Temp: 97.8 F (36.6 C)   SpO2: 95%     Intake/Output Summary (Last 24 hours) at 01/21/2022 1157 Last data filed at 01/21/2022 0800 Gross per 24 hour  Intake 440 ml  Output 0 ml  Net 440 ml    Filed Weights   01/17/22 2337 01/19/22 0302 01/20/22 0553  Weight: 62 kg 61.2 kg 61.6 kg   Weight change:  Body mass index is 20.66 kg/m.   Physical Exam: General exam: Pleasant, elderly Caucasian male. Skin: No rashes, lesions or ulcers. HEENT: Atraumatic, normocephalic, no obvious bleeding Lungs: Diminished air entry in left hemithorax. Right side clear. CVS: Regular rate and rhythm, no murmur. GI/Abd soft, and area of swelling and tenderness in left flank, nondistended, bowel sound present CNS: Alert, awake, oriented x3 Psychiatry: Mood appropriate Extremities: No pedal edema, no calf  tenderness  Data Review: I have personally reviewed the laboratory data and studies available.  F/u labs ordered Unresulted Labs (From admission, onward)     Start     Ordered   01/17/22 1611  Urine Culture  Once,   URGENT       Question:  Indication  Answer:  Flank Pain   01/17/22 1612   01/17/22 1610  Urinalysis, Routine w reflex microscopic  Once,   URGENT        01/17/22 1612            Signed, Terrilee Croak, MD [facility] 01/21/2022

## 2022-01-21 NOTE — Anesthesia Preprocedure Evaluation (Signed)
Anesthesia Evaluation  Patient identified by MRN, date of birth, ID band Patient awake    Reviewed: Allergy & Precautions, NPO status , Patient's Chart, lab work & pertinent test results, reviewed documented beta blocker date and time   Airway Mallampati: II  TM Distance: >3 FB Neck ROM: Full    Dental  (+) Dental Advisory Given   Pulmonary former smoker,    breath sounds clear to auscultation       Cardiovascular hypertension, Pt. on home beta blockers and Pt. on medications + CAD, + Past MI and + Cardiac Stents   Rhythm:Regular Rate:Normal     Neuro/Psych negative neurological ROS     GI/Hepatic negative GI ROS, Neg liver ROS,   Endo/Other  negative endocrine ROS  Renal/GU negative Renal ROS     Musculoskeletal   Abdominal   Peds  Hematology negative hematology ROS (+)   Anesthesia Other Findings   Reproductive/Obstetrics                             Lab Results  Component Value Date   WBC 10.8 (H) 01/18/2022   HGB 12.3 (L) 01/18/2022   HCT 37.3 (L) 01/18/2022   MCV 89.7 01/18/2022   PLT 319 01/18/2022   Lab Results  Component Value Date   CREATININE 0.82 01/17/2022   BUN 19 01/17/2022   NA 140 01/17/2022   K 4.7 01/17/2022   CL 111 01/17/2022   CO2 22 01/17/2022    Anesthesia Physical Anesthesia Plan  ASA: 3  Anesthesia Plan: General   Post-op Pain Management: Tylenol PO (pre-op)*   Induction: Intravenous  PONV Risk Score and Plan: 2 and Dexamethasone, Ondansetron and Treatment may vary due to age or medical condition  Airway Management Planned: Double Lumen EBT  Additional Equipment: Arterial line  Intra-op Plan:   Post-operative Plan: Extubation in OR and Possible Post-op intubation/ventilation  Informed Consent: I have reviewed the patients History and Physical, chart, labs and discussed the procedure including the risks, benefits and alternatives for the  proposed anesthesia with the patient or authorized representative who has indicated his/her understanding and acceptance.     Dental advisory given  Plan Discussed with:   Anesthesia Plan Comments:         Anesthesia Quick Evaluation

## 2022-01-22 ENCOUNTER — Telehealth: Payer: Self-pay | Admitting: Pulmonary Disease

## 2022-01-22 ENCOUNTER — Encounter (HOSPITAL_COMMUNITY): Payer: Self-pay | Admitting: Thoracic Surgery (Cardiothoracic Vascular Surgery)

## 2022-01-22 ENCOUNTER — Inpatient Hospital Stay (HOSPITAL_COMMUNITY): Payer: Medicare Other

## 2022-01-22 DIAGNOSIS — J9601 Acute respiratory failure with hypoxia: Secondary | ICD-10-CM | POA: Diagnosis not present

## 2022-01-22 DIAGNOSIS — J90 Pleural effusion, not elsewhere classified: Secondary | ICD-10-CM | POA: Diagnosis not present

## 2022-01-22 DIAGNOSIS — I480 Paroxysmal atrial fibrillation: Secondary | ICD-10-CM

## 2022-01-22 LAB — CYTOLOGY - NON PAP

## 2022-01-22 LAB — SURGICAL PATHOLOGY

## 2022-01-22 MED ORDER — TRAMADOL HCL 50 MG PO TABS
50.0000 mg | ORAL_TABLET | Freq: Four times a day (QID) | ORAL | Status: DC | PRN
  Administered 2022-01-22 – 2022-01-23 (×2): 50 mg via ORAL
  Filled 2022-01-22 (×2): qty 1

## 2022-01-22 NOTE — Hospital Course (Signed)
Pedro Pope. is an 81 y.o. male with PMH significant for CAD s/p DES to LAD 2016, ischemic cardiomyopathy, mild systolic CHF, paroxysmal A-fib, Dressler syndrome, prediabetes, prostate cancer s/p prostatectomy and radiation, fatty liver, gout. Patient presented to the ED on 6/2 with worsening shortness of breath. In April, patient was treated with multiple courses of antibiotics for pneumonia despite which he continued to have shortness of breath and pleuritic chest pain. 5/8, CT chest showed moderate loculated left pleural effusion. 5/16, he was seen by pulmonology as an outpatient and underwent thoracentesis.  1800 cc of amber appearing fluid was removed.  Chest x-ray postprocedure showed worsening peripheral left midlung airspace opacity with a small effusion. His symptoms continued to progress to DOE and poor oral intake. On 6/2, he followed up at pulmonology office.  He was noted to hypoxic, started on oxygen and sent to the ED.   In the ED, patient was afebrile, respiratory rate in 20s, required up to 6 L oxygen by nasal cannula. Labs showed WBC count elevated 11.7, CMP unremarkable, BNP elevated to 192. CT abdomen/pelvis showed large left pleural effusion and peripheral pleural nodules on the left side suggesting malignant neoplastic process such as mesothelioma or pleural metastatic disease.  He was also noted to have a 13 mm lesion in the right lobe of the liver suspicious for hemangioma versus metastatic disease. He underwent VATS, pleural biopsy, and placement of Pleurx catheter on 01/21/2022.

## 2022-01-22 NOTE — Assessment & Plan Note (Addendum)
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

## 2022-01-22 NOTE — Anesthesia Postprocedure Evaluation (Signed)
Anesthesia Post Note  Patient: Pedro Pope.  Procedure(s) Performed: VIDEO ASSISTED THORACOSCOPY (VATS)/DECORTICATION (Left)     Patient location during evaluation: PACU Anesthesia Type: General Level of consciousness: awake and alert Pain management: pain level controlled Vital Signs Assessment: post-procedure vital signs reviewed and stable Respiratory status: spontaneous breathing, nonlabored ventilation, respiratory function stable and patient connected to nasal cannula oxygen Cardiovascular status: blood pressure returned to baseline and stable Postop Assessment: no apparent nausea or vomiting Anesthetic complications: no   No notable events documented.  Last Vitals:  Vitals:   01/22/22 0347 01/22/22 1134  BP: (!) 108/58 102/67  Pulse: 62 65  Resp: 18 20  Temp: 36.4 C   SpO2: 99% 97%    Last Pain:  Vitals:   01/22/22 0817  TempSrc:   PainSc: 2                  Tiajuana Amass

## 2022-01-22 NOTE — Progress Notes (Signed)
Mobility Specialist Progress Note    01/22/22 1637  Mobility  Activity Ambulated with assistance in hallway  Level of Assistance Contact guard assist, steadying assist  Assistive Device Front wheel walker  Distance Ambulated (ft) 470 ft  Activity Response Tolerated well  $Mobility charge 1 Mobility   Pre-Mobility: 69 HR, 93% SpO2 Post-Mobility: 77 HR, 90% SpO2  Pt received in bed and agreeable. No complaints on walk. Returned to sitting EOB with call bell in reach.    Hildred Alamin Mobility Specialist

## 2022-01-22 NOTE — Assessment & Plan Note (Signed)
-  Per pulmonology, patient likely had tracking of hematoma from thoracentesis puncture site and collecting subcutaneously in the left flank.   - plavix on hold

## 2022-01-22 NOTE — Plan of Care (Signed)
  Problem: Education: Goal: Knowledge of General Education information will improve Description: Including pain rating scale, medication(s)/side effects and non-pharmacologic comfort measures Outcome: Progressing   Problem: Clinical Measurements: Goal: Ability to maintain clinical measurements within normal limits will improve Outcome: Progressing Goal: Respiratory complications will improve Outcome: Progressing   Problem: Activity: Goal: Risk for activity intolerance will decrease Outcome: Progressing   Problem: Nutrition: Goal: Adequate nutrition will be maintained Outcome: Progressing   Problem: Coping: Goal: Level of anxiety will decrease Outcome: Progressing   Problem: Elimination: Goal: Will not experience complications related to urinary retention Outcome: Progressing   Problem: Pain Managment: Goal: General experience of comfort will improve Outcome: Progressing

## 2022-01-22 NOTE — Progress Notes (Signed)
Progress Note    [employer].   FAO:[id number]  DOB: 11-03-1940  DOA: 01/17/2022     5 PCP: Shon Baton, MD  Initial CC: SOB  Hospital Course: Pedro Pope. is an 81 y.o. male with PMH significant for CAD s/p DES to LAD 2016, ischemic cardiomyopathy, mild systolic CHF, paroxysmal A-fib, Dressler syndrome, prediabetes, prostate cancer s/p prostatectomy and radiation, fatty liver, gout. Patient presented to the ED on 6/2 with worsening shortness of breath. In April, patient was treated with multiple courses of antibiotics for pneumonia despite which he continued to have shortness of breath and pleuritic chest pain. 5/8, CT chest showed moderate loculated left pleural effusion. 5/16, he was seen by pulmonology as an outpatient and underwent thoracentesis.  1800 cc of amber appearing fluid was removed.  Chest x-ray postprocedure showed worsening peripheral left midlung airspace opacity with a small effusion. His symptoms continued to progress to DOE and poor oral intake. On 6/2, he followed up at pulmonology office.  He was noted to hypoxic, started on oxygen and sent to the ED.   In the ED, patient was afebrile, respiratory rate in 20s, required up to 6 L oxygen by nasal cannula. Labs showed WBC count elevated 11.7, CMP unremarkable, BNP elevated to 192. CT abdomen/pelvis showed large left pleural effusion and peripheral pleural nodules on the left side suggesting malignant neoplastic process such as mesothelioma or pleural metastatic disease.  He was also noted to have a 13 mm lesion in the right lobe of the liver suspicious for hemangioma versus metastatic disease. He underwent VATS, pleural biopsy, and placement of Pleurx catheter on 01/21/2022.  Interval History:  Family present this morning; patient endorsed feeling that his breathing was easier and improved since yesterday. Family aware of frozen biopsy findings and plan is for further outpatient management.  Assessment and  Plan: * Acute respiratory failure with hypoxia (HCC) - Secondary to large left-sided pleural effusion now found to be consistent with malignancy on frozen section  - s/p VATS and pleurx cath on 6/6 - wean O2 as able; likely needs home O2 at discharge  Pleural effusion on left - s/p VATS, pleural biopsy, and pleurx cath placement on 6/6 -Frozen biopsy consistent with cancer.  Pathology pending - outpatient followup scheduled with pulmonology and TCTS at discharge  - will need referral to oncology once pathology results   Paroxysmal atrial fibrillation with RVR ([place]) -6/4, patient went to A-fib with RVR. Responded to Cardizem drip, now off - continue coreg  Left lower quadrant abdominal swelling Possible small hematoma.  No significant findings seen on CT abdomen/pelvis  History of prostate cancer s/p RRP and salave EBRT. Followed by oncology at [facility]. 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   - plavix on hold   CAD S/P percutaneous coronary angioplasty S/p DES to LAD in 2016 on Plavix  -hold Plavix due to concerns of hemothorax from recent thoracentesis -continue Coreg and statin   Old records reviewed in assessment of this patient  Antimicrobials:   DVT prophylaxis:  SCDs Start: 01/17/22 2328   Code Status:   Code Status: Full Code  Disposition Plan: Home in 1 to 2 days Status is: Inpatient  Objective: Blood pressure 102/67, pulse 65, temperature 97.6 F (36.4 C), temperature source Oral, resp. rate 20, height 5\' 8"  (1.727 m), weight 61.6 kg, SpO2  97 %.  Examination:  Physical Exam Constitutional:      General: He is not in acute distress. HENT:     Head: Normocephalic and atraumatic.     Mouth/Throat:     Mouth: Mucous membranes are moist.  Eyes:     Extraocular Movements:  Extraocular movements intact.  Cardiovascular:     Rate and Rhythm: Normal rate and regular rhythm.  Pulmonary:     Effort: No respiratory distress.     Comments: No wheezing noted; decreased left sided breath sounds; pleurx in place on left chest Abdominal:     General: Bowel sounds are normal. There is no distension.     Palpations: Abdomen is soft.     Tenderness: There is no abdominal tenderness.  Musculoskeletal:        General: Normal range of motion.     Cervical back: Normal range of motion and neck supple.  Skin:    General: Skin is warm and dry.  Neurological:     General: No focal deficit present.     Mental Status: He is alert.  Psychiatric:        Mood and Affect: Mood normal.        Behavior: Behavior normal.     Consultants:  Pulmonology TCTS  Procedures:  VATS, pleural biopsy, Pleurx cath placement: 01/21/22  Data Reviewed: No results found for this or any previous visit (from the past 24 hour(s)).  I have Reviewed nursing notes, Vitals, and Lab results since pt's last encounter. Pertinent lab results : see above I have ordered test including BMP, CBC, Mg I have reviewed the last note from staff over past 24 hours I have discussed pt's care plan and test results with nursing staff, case manager   LOS: 5 days   Dwyane Dee, MD [facility] 01/22/2022, 2:36 PM

## 2022-01-22 NOTE — Assessment & Plan Note (Signed)
-  6/4, patient went to A-fib with RVR. Responded to Cardizem drip, now off - continue coreg

## 2022-01-22 NOTE — Progress Notes (Addendum)
[address]       [place] [address]             [phone]      1 Day Post-Op Procedure(s) (LRB): VIDEO ASSISTED THORACOSCOPY (VATS)/DECORTICATION (Left) Subjective: Pretty comfortable, able to take deeper breaths  Objective: Vital signs in last 24 hours: Temp:  [97.6 F (36.4 C)-98.1 F (36.7 C)] 97.6 F (36.4 C) (06/07 0347) Pulse Rate:  [61-80] 62 (06/07 0347) Cardiac Rhythm: Normal sinus rhythm (06/06 1900) Resp:  [10-20] 18 (06/07 0347) BP: (94-125)/(45-102) 108/58 (06/07 0347) SpO2:  [92 %-99 %] 99 % (06/07 0347) Arterial Line BP: (97-128)/(43-53) 108/43 (06/06 1600)  Hemodynamic parameters for last 24 hours:    Intake/Output from previous day: 06/06 0701 - 06/07 0700 In: 1740 [P.O.:240; I.V.:1000; IV Piggyback:500] Out: 7106 [Blood:30; Chest Tube:520] Intake/Output this shift: No intake/output data recorded.  General appearance: alert, cooperative, and no distress Heart: regular rate and rhythm Lungs: fair air exchange, decreased more so on left Abdomen: benign Extremities: no calf tenderness Wound: healing well  Lab Results: No results for input(s): WBC, HGB, HCT, PLT in the last 72 hours. BMET: No results for input(s): NA, K, CL, CO2, GLUCOSE, BUN, CREATININE, CALCIUM in the last 72 hours.  PT/INR:  Recent Labs    01/20/22 1516  LABPROT 15.1  INR 1.2   ABG    Component Value Date/Time   TCO2 19 11/03/2014 2108   CBG (last 3)  No results for input(s): GLUCAP in the last 72 hours.  Meds Scheduled Meds:  ascorbic acid  500 mg Oral Daily   atorvastatin  40 mg Oral QPM   carvedilol  3.125 mg Oral BID WC   cholecalciferol  1,000 Units Oral Daily   Continuous Infusions: PRN Meds:.acetaminophen, albuterol, lidocaine  Xrays CT CHEST W CONTRAST  Result Date: 01/20/2022 CLINICAL DATA:  Large left pleural effusion. EXAM: CT CHEST WITH CONTRAST TECHNIQUE: Multidetector CT imaging of the chest was performed during  intravenous contrast administration. RADIATION DOSE REDUCTION: This exam was performed according to the departmental dose-optimization program which includes automated exposure control, adjustment of the mA and/or kV according to patient size and/or use of iterative reconstruction technique. CONTRAST:  152mL OMNIPAQUE IOHEXOL 300 MG/ML  SOLN COMPARISON:  Recent chest CTs from May 8 and Jan 15, 2022 FINDINGS: Cardiovascular: The heart is normal in size. No pericardial effusion. The aorta is normal in caliber. Stable atherosclerotic calcifications. The pulmonary arteries are patent. Stable three-vessel coronary artery calcifications. Mediastinum/Nodes: Stable mediastinal lymphadenopathy. 2.5 cm AP window node on image number 63/3. Left hilar nodes versus hilar pleural tumor. The more lateral lesion measures 21 mm and the more medial lesion measures 13 mm. The esophagus is grossly normal. Lungs/Pleura: Very large left pleural effusion increased in size since the prior CT scan. Essentially drowned/obstructed left lung. Extensive enhancing pleural tumor involving the left hemithorax. The heart and mediastinum are displaced to the right because of the large left hydrothorax. The right lung demonstrates advanced emphysematous changes and pulmonary scarring but no pulmonary nodules or right-sided pleural tumor. Upper Abdomen: No significant upper abdominal findings. Advanced vascular calcifications. Musculoskeletal: No significant bony findings. IMPRESSION: 1. Very large left pleural effusion increased in size since the prior CT scan. 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 2. Essentially drowned/obstructed left lung. 3. Extensive enhancing left pleural tumor and mediastinal adenopathy. 4. Advanced emphysematous changes and pulmonary scarring in  the right lung but no pulmonary nodules or right-sided pleural tumor. 5. Stable advanced vascular calcifications.  Aortic Atherosclerosis (ICD10-I70.0) and Emphysema (ICD10-J43.9). Electronically Signed   By: Marijo Sanes M.D.   On: 01/20/2022 16:43    Assessment/Plan: S/P Procedure(s) (LRB): VIDEO ASSISTED THORACOSCOPY (VATS)/DECORTICATION (Left) POD#1 1 afeb, VSS 2 sats good on 4 liters 3 CT 520 cc recorded- serosang, keep in place, no air leak 4 voiding well 5 no new labs 6 some improvement with aeration 7 will add some prn ultram 8 mobilize as able, routine pulm hygiene 9 medical management as per primary     LOS: 5 days    John Giovanni PA-C Pager [phone] 01/22/2022    Agree with above Pleurx catheter teaching [place]

## 2022-01-22 NOTE — Telephone Encounter (Signed)
Patient scheduled for HFU with Dr. Silas Flood 6/16 at 2:30pm- appointment reminder mailed to address on file.

## 2022-01-22 NOTE — Progress Notes (Addendum)
NAME:  Pedro Gulas., MRN:  [id number], DOB:  November 01, 1940, LOS: 5 ADMISSION DATE:  01/17/2022, CONSULTATION DATE: January 17, 2022 REFERRING MD: Dr. Sherry Ruffing, CHIEF COMPLAINT: Dyspnea  History of Present Illness:  This is a very pleasant 81 year old male who has been followed by [facility] pulmonary for the last several weeks in the setting of a moderate to large left-sided pleural effusion.  The patient is a prior smoker who was diagnosed with pneumonia in late March and treated with antibiotics.  He had progressive dyspnea after this and by April 23 he was noted to have chest pain, coughing, and was treated for pneumonia.  He had persistent pleuritic pain and little resolution of symptoms so he had a CT chest which showed lymphadenopathy and effusion.  He was noted to be on Plavix which was held and plans were made for a thoracentesis which was performed on May 16.  1800 cc of amber appearing fluid was removed.  Postoperative imaging showed persistent opacification of the left lung and a small pleural effusion.  He has followed with pulmonary since the procedure.  He was treated with a course of Levaquin and doxycycline on separate occasions.  He continues to have some degree of dyspnea but it has been steadily worsening ever since the thoracentesis.  He now is unable to take several steps.  For the last 4 to 5 days he is also had increasing swelling and pain in his left flank.  He presented to the pulmonary office today was noted to be hypoxemic and was started on oxygen and sent to the emergency room for further evaluation.  Pertinent  Medical History  Coronary artery disease> status post LAD stent, on Plavix Former cigarette smoker, smoked 1-1/2 packs of cigarettes daily up until 1994, Prostate cancer Prediabetes Paroxysmal A-fib Fatty liver   Significant Hospital Events: Including procedures, antibiotic start and stop dates in addition to other pertinent events   June 2 admission 6/3 sitting up  91% with O2 off, states that his breathing is feeling improved 6/4 atrial fibrillation 6/6 VATS biopsy, PleurX placed  Interim History / Subjective:  Breathing is stable improved after pleural fluid drainage. Long discussion with patient, wife, son, sibling. Lung appeared encased in tumor. Pleural biopsy consistent with cancer. Multiple questions answered as able.   Objective   Blood pressure (!) 108/58, pulse 62, temperature 97.6 F (36.4 C), temperature source Oral, resp. rate 18, height 5\' 8"  (1.727 m), weight 61.6 kg, SpO2 99 %.        Intake/Output Summary (Last 24 hours) at 01/22/2022 1112 Last data filed at 01/22/2022 0841 Gross per 24 hour  Intake 1740 ml  Output 3890 ml  Net -2150 ml    Filed Weights   01/17/22 2337 01/19/22 0302 01/20/22 0553  Weight: 62 kg 61.2 kg 61.6 kg    General:  elderly man lying in bed in NAD, appears younger than stated age HENT: [place]/AT, eyes anicteric PULM: breathing comfortably on [place], reduced left basilar breath sounds CV: S1S2, RRR GI: soft, NT MSK: normal muscle mass, no clubbing or cyanosis. Left flank mass mobile, no associated bruising.  Neuro: awake, alert, normal speech, answering questions appropriately  H/H 12.3/37.2, down from ~14/42 in April  Pleural fluid studies from 12/31/2021: LD 218 Protein 3.6g WBC 670 (77% lymphs) Culture> NG Path> No malignant cells. Reactive mesothelial cells and chronic inflammatory cells present.  CT w/ contrast personally  reviewed> left effusion with significant compression atelectasis, contrasted images with irregular, nodular pleura  with some heterogeneity in density  Resolved Hospital Problem list     Assessment & Plan:  Left Pleural effusion Lung mass with initial frozen section of pleural biopsy consistent with cancer Pleural nodules, suspect metastatic from lung cancer  Plan: --Continue PleurX cathter drainage, needs teaching and resources for PleurX secured at home prior to  d/c --Wean O2, goal sat > 88%, will need to ambulate and assess exertional O2 need --Will arrange f/u in pulmonary clinic for next week --Appreciate Thoracic Surgery assistance  PCCM will sign off.   Best Practice (right click and "Reselect all SmartList Selections" daily)   Per [place]   Critical care time: n/a     Lanier Clam, MD  01/22/22 11:12 AM [facility]

## 2022-01-22 NOTE — Telephone Encounter (Signed)
Please schedule hospital follow up with myself or NP next week. Thanks!

## 2022-01-23 DIAGNOSIS — J942 Hemothorax: Secondary | ICD-10-CM | POA: Diagnosis not present

## 2022-01-23 DIAGNOSIS — C801 Malignant (primary) neoplasm, unspecified: Secondary | ICD-10-CM | POA: Diagnosis not present

## 2022-01-23 DIAGNOSIS — J90 Pleural effusion, not elsewhere classified: Secondary | ICD-10-CM | POA: Diagnosis not present

## 2022-01-23 DIAGNOSIS — J9601 Acute respiratory failure with hypoxia: Secondary | ICD-10-CM | POA: Diagnosis not present

## 2022-01-23 LAB — ACID FAST SMEAR (AFB, MYCOBACTERIA): Acid Fast Smear: NEGATIVE

## 2022-01-23 MED ORDER — MORPHINE SULFATE (PF) 2 MG/ML IV SOLN
2.0000 mg | INTRAVENOUS | Status: DC | PRN
  Administered 2022-01-23 (×2): 2 mg via INTRAVENOUS
  Filled 2022-01-23 (×2): qty 1

## 2022-01-23 MED ORDER — OXYCODONE HCL 5 MG PO TABS: 2.5000 mg | ORAL_TABLET | ORAL | 0 refills | Status: AC | PRN

## 2022-01-23 MED ORDER — MORPHINE SULFATE (PF) 2 MG/ML IV SOLN: 2.0000 mg | INTRAVENOUS | Status: DC | PRN

## 2022-01-23 NOTE — Progress Notes (Signed)
Mobility Specialist Progress Note:   01/23/22 1021  Mobility  Activity Ambulated with assistance in hallway  Level of Assistance Standby assist, set-up cues, supervision of patient - no hands on  Assistive Device Front wheel walker  Distance Ambulated (ft) 470 ft  Activity Response Tolerated well  $Mobility charge 1 Mobility   Pt received in bed willing to participate in mobility. No complaints of pain. Left in bed with call bell in reach and all needs met.    [facility] Peg Fifer Mobility Specialist

## 2022-01-23 NOTE — Progress Notes (Signed)
SATURATION QUALIFICATIONS: (This note is used to comply with regulatory documentation for home oxygen)  Patient Saturations on Room Air at Rest = 85%  Patient Saturations on Room Air while Ambulating = 79%  Patient Saturations on 4 Liters of oxygen while Ambulating = 91%  Please briefly explain why patient needs home oxygen: [place] while ambulating short distances . Desats at rest after several minutes off of oxygen.

## 2022-01-23 NOTE — TOC Progression Note (Addendum)
Transition of Care (TOC) - Progression Note    Patient Details  Name: Pedro Pope. MRN: [id number] Date of Birth: July 25, 1941  Transition of Care [facility] CM/SW Contact  Angelita Ingles, RN Phone Number:[phone]  01/23/2022, 12:33 PM  Clinical Narrative:   CM received message stating that patient is to discharge home with [facility] PT/ RN, pleurex drain and Home O2. CM at bedside to offer choice for home health. Patient and family have no preference. Home health referral has been accepted by [facility] with Alvis Lemmings. Patient has orders for home O2. Cm has made nurse aware that CM will need qualifying O2 screen noted before Home O2 can be set up.   [facility] referral accepted per [place]. Portable tank to be delivered to bedside.    1518 Pleurx drain supply written order form has been placed on chart for MD signature. MD Girguis has been notified and will sign form. This form will be faxed to [facility] to ensure that patient is able to receive supplies post discharge.        Expected Discharge Plan and Services                                                 Social Determinants of Health (SDOH) Interventions    Readmission Risk Interventions     No data to display

## 2022-01-23 NOTE — Discharge Summary (Addendum)
Physician Discharge Summary   Pedro Pope. UYQ:[id number] DOB: Nov 23, 1940 DOA: 01/17/2022  PCP: Shon Baton, MD  Admit date: 01/17/2022 Discharge date: 01/23/2022  Admitted From: Home Disposition:  Home Discharging physician: Dwyane Dee, MD  Recommendations for Outpatient Follow-up:  Follow up with pulmonology and thoracic surgery Plavix held at discharge due to hemothorax Needs referral to oncology  Consider palliative care referral soon   Home Health: PT, RN Equipment/Devices: O2, Pleurx supplies  Discharge Condition: fair CODE STATUS: Full Diet recommendation:  Diet Orders (From admission, onward)     Start     Ordered   01/23/22 0000  Diet general        01/23/22 1437   01/17/22 2328  Diet regular Room service appropriate? Yes; Fluid consistency: Thin  Diet effective now       Question Answer Comment  Room service appropriate? Yes   Fluid consistency: Thin      01/17/22 2328            Hospital Course: Pedro Pope. is an 81 y.o. male with PMH significant for CAD s/p DES to LAD 2016, ischemic cardiomyopathy, mild systolic CHF, paroxysmal A-fib, Dressler syndrome, prediabetes, prostate cancer s/p prostatectomy and radiation, fatty liver, gout. Patient presented to the ED on 6/2 with worsening shortness of breath. In April, patient was treated with multiple courses of antibiotics for pneumonia despite which he continued to have shortness of breath and pleuritic chest pain. 5/8, CT chest showed moderate loculated left pleural effusion. 5/16, he was seen by pulmonology as an outpatient and underwent thoracentesis.  1800 cc of amber appearing fluid was removed.  Chest x-ray postprocedure showed worsening peripheral left midlung airspace opacity with a small effusion. His symptoms continued to progress to DOE and poor oral intake. On 6/2, he followed up at pulmonology office.  He was noted to hypoxic, started on oxygen and sent to the ED.   In the ED, patient  was afebrile, respiratory rate in 20s, required up to 6 L oxygen by nasal cannula. Labs showed WBC count elevated 11.7, CMP unremarkable, BNP elevated to 192. CT abdomen/pelvis showed large left pleural effusion and peripheral pleural nodules on the left side suggesting malignant neoplastic process such as mesothelioma or pleural metastatic disease.  He was also noted to have a 13 mm lesion in the right lobe of the liver suspicious for hemangioma versus metastatic disease. He underwent VATS, pleural biopsy, and placement of Pleurx catheter on 01/21/2022.  Assessment and Plan: * Acute respiratory failure with hypoxia ([place]) - Secondary to large left-sided pleural effusion now found to be consistent with small cell carcinoma on pathology - s/p VATS and pleurx cath on 6/6 -Home oxygen arranged at time of discharge  Pleural effusion on left - s/p VATS, pleural biopsy, and pleurx cath placement on 6/6 - Pathology has returned consistent with small cell carcinoma - outpatient followup scheduled with pulmonology and TCTS at discharge  - will need referral to oncology  Paroxysmal atrial fibrillation with RVR ([place]) -6/4, patient went to A-fib with RVR. Responded to Cardizem drip, now off - continue coreg  Left lower quadrant abdominal swelling Possible small hematoma.  No significant findings seen on CT abdomen/pelvis  History of prostate cancer s/p RRP and salave EBRT. Followed by oncology at [facility]. 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   - plavix  on hold   CAD S/P percutaneous coronary angioplasty S/p DES to LAD in 2016 on Plavix  -continue Coreg - d/c statin given no further mortality benefit in setting of malignancy    Principal Diagnosis: Acute respiratory failure with hypoxia [facility])  Discharge  Diagnoses: Principal Problem:   Acute respiratory failure with hypoxia (HCC) Active Problems:   Pleural effusion on left   CAD S/P percutaneous coronary angioplasty   Hemothorax   History of prostate cancer   Left lower quadrant abdominal swelling   Paroxysmal atrial fibrillation with RVR (HCC)   Small cell carcinoma (HCC)   Discharge Instructions     Diet general   Complete by: As directed    Discharge wound care:   Complete by: As directed    Continue pleurx wound care as provided   Increase activity slowly   Complete by: As directed       Allergies as of 01/23/2022       Reactions   Penicillins Anaphylaxis   Corn Syrup [glucose] Other (See Comments)   High fructose corn syrup causes gout   Aspirin Adult Low [aspirin] Itching, Rash   Dr. [name] suspects that long term caused a rash, short term use appears to be ok        Medication List     STOP taking these medications    atorvastatin 40 MG tablet Commonly known as: LIPITOR   clopidogrel 75 MG tablet Commonly known as: PLAVIX   levofloxacin 500 MG tablet Commonly known as: LEVAQUIN       TAKE these medications    acetaminophen 500 MG tablet Commonly known as: TYLENOL Take 500 mg by mouth every 6 (six) hours as needed for mild pain or moderate pain.   albuterol 108 (90 Base) MCG/ACT inhaler Commonly known as: VENTOLIN HFA Inhale 2 puffs into the lungs every 4 (four) hours as needed for shortness of breath.   ascorbic acid 500 MG tablet Commonly known as: VITAMIN C Take 500 mg by mouth daily.   Blood Pressure Monitor Kit For daily blood pressure monitoring;  Arm Cuff   carvedilol 3.125 MG tablet Commonly known as: COREG TAKE 1 TABLET BY MOUTH TWICE A DAY WITH MEALS What changed: when to take this   colchicine 0.6 MG tablet Take 1 tablet (0.6 mg total) by mouth daily. What changed:  when to take this reasons to take this   nitroGLYCERIN 0.4 MG SL tablet Commonly known as:  Nitrostat Place 1 tablet (0.4 mg total) under the tongue every 5 (five) minutes as needed for chest pain.   oxyCODONE 5 MG immediate release tablet Commonly known as: Roxicodone Take 0.5-1 tablets (2.5-5 mg total) by mouth every 4 (four) hours as needed.   Vitamin D3 25 MCG (1000 UT) Caps Take 1 capsule by mouth daily.               Durable Medical Equipment  (From admission, onward)           Start     Ordered   01/22/22 1435  For home use only DME oxygen  Once       Question Answer Comment  Length of Need 12 Months   Mode or (Route) Nasal cannula   Liters per Minute 3   Frequency Continuous (stationary and portable oxygen unit needed)   Oxygen conserving device Yes   Oxygen delivery system Gas      01/22/22 1434  Discharge Care Instructions  (From admission, onward)           Start     Ordered   01/23/22 0000  Discharge wound care:       Comments: Continue pleurx wound care as provided   01/23/22 1437            Follow-up Information     Shon Baton, MD Follow up.   Specialty: Internal Medicine Contact information: [place] [address] [phone]         Lorretta Harp, MD .   Specialties: Cardiology, Radiology Contact information: [address] [place] [address] [phone]         Lajuana Matte, MD Follow up.   Specialty: Cardiothoracic Surgery Why: Please see discharge paperwork for follow-up appointment with Dr. Kipp Brood.  On the date you see Dr. Kipp Brood obtain a chest x-ray at [facility] 1/2-hour prior to the appointment.  It is located in the same office complex on the first floor. Contact information: [address] [place] [address] [phone]         [facility] Follow up.   Specialty: Home Health Services Why: Your home health has been set up with [facility] The office will call you with start of service  information. If you have any questions or concerns please call number listed above Contact information: [address] [place] [address] [phone]                Allergies  Allergen Reactions   Penicillins Anaphylaxis   Corn Syrup [Glucose] Other (See Comments)    High fructose corn syrup causes gout   Aspirin Adult Low [Aspirin] Itching and Rash    Dr. [name] suspects that long term caused a rash, short term use appears to be ok    Consultations: TCTS Pulmonology   Procedures: VATS, pleural biopsy, Pleurx cath placement: 01/21/22  Discharge Exam: BP (!) 96/57 (BP Location: Left Arm)   Pulse 62   Temp 97.6 F (36.4 C) (Oral)   Resp 14   Ht 5' 8"  (1.727 m)   Wt 61.6 kg   SpO2 96%   BMI 20.66 kg/m  Physical Exam Constitutional:      General: He is not in acute distress. HENT:     Head: Normocephalic and atraumatic.     Mouth/Throat:     Mouth: Mucous membranes are moist.  Eyes:     Extraocular Movements: Extraocular movements intact.  Cardiovascular:     Rate and Rhythm: Normal rate and regular rhythm.  Pulmonary:     Effort: No respiratory distress.     Comments: No wheezing noted; decreased left sided breath sounds; pleurx in place on left chest Abdominal:     General: Bowel sounds are normal. There is no distension.     Palpations: Abdomen is soft.     Tenderness: There is no abdominal tenderness.  Musculoskeletal:        General: Normal range of motion.     Cervical back: Normal range of motion and neck supple.  Skin:    General: Skin is warm and dry.  Neurological:     General: No focal deficit present.     Mental Status: He is alert.  Psychiatric:        Mood and Affect: Mood normal.        Behavior: Behavior normal.      The results of significant diagnostics  from this hospitalization (including imaging, microbiology, ancillary and laboratory) are listed below for reference.   Microbiology: Recent Results (from the past  240 hour(s))  SARS Coronavirus 2 by RT PCR (hospital order, performed in [facility] hospital lab) *cepheid single result test* Anterior Nasal Swab     Status: None   Collection Time: 01/17/22  4:11 PM   Specimen: Anterior Nasal Swab  Result Value Ref Range Status   SARS Coronavirus 2 by RT PCR NEGATIVE NEGATIVE Final    Comment: (NOTE) SARS-CoV-2 target nucleic acids are NOT DETECTED.  The SARS-CoV-2 RNA is generally detectable in upper and lower respiratory specimens during the acute phase of infection. The lowest concentration of SARS-CoV-2 viral copies this assay can detect is 250 copies / mL. A negative result does not preclude SARS-CoV-2 infection and should not be used as the sole basis for treatment or other patient management decisions.  A negative result may occur with improper specimen collection / handling, submission of specimen other than nasopharyngeal swab, presence of viral mutation(s) within the areas targeted by this assay, and inadequate number of viral copies (<250 copies / mL). A negative result must be combined with clinical observations, patient history, and epidemiological information.  Fact Sheet for Patients:   https://www.patel.info/  Fact Sheet for Healthcare Providers: https://hall.com/  This test is not yet approved or  cleared by the [place] FDA and has been authorized for detection and/or diagnosis of SARS-CoV-2 by FDA under an Emergency Use Authorization (EUA).  This EUA will remain in effect (meaning this test can be used) for the duration of the COVID-19 declaration under Section 564(b)(1) of the Act, 21 U.S.C. section 360bbb-3(b)(1), unless the authorization is terminated or revoked sooner.  Performed at [facility], [place] [address] [place], [place] [address]   Surgical pcr screen     Status: None   Collection Time: 01/21/22  3:31 AM   Specimen: Nasal Mucosa; Nasal Swab  Result Value  Ref Range Status   MRSA, PCR NEGATIVE NEGATIVE Final   Staphylococcus aureus NEGATIVE NEGATIVE Final    Comment: (NOTE) The Xpert SA Assay (FDA approved for NASAL specimens in patients 74 years of age and older), is one component of a comprehensive surveillance program. It is not intended to diagnose infection nor to guide or monitor treatment. Performed at [facility], [place] [address], [place], [place] [address]   Aerobic/Anaerobic Culture w Gram Stain (surgical/deep wound)     Status: None (Preliminary result)   Collection Time: 01/21/22  2:31 PM   Specimen: PATH Cytology Pleural fluid; Body Fluid  Result Value Ref Range Status   Specimen Description PLEURAL  Final   Special Requests LEFT PLEURAL FLUID  Final   Gram Stain   Final    RARE WBC PRESENT,BOTH PMN AND MONONUCLEAR NO ORGANISMS SEEN    Culture   Final    NO GROWTH 2 DAYS NO ANAEROBES ISOLATED; CULTURE IN PROGRESS FOR 5 DAYS Performed at [facility], [place] [address] [place], [place] [address]    Report Status PENDING  Incomplete  Acid Fast Smear (AFB)     Status: None   Collection Time: 01/21/22  2:31 PM   Specimen: PATH Cytology Pleural fluid; Body Fluid  Result Value Ref Range Status   AFB Specimen Processing Concentration  Final   Acid Fast Smear Negative  Final    Comment: (NOTE) Performed At: [facility] [place], [place] [id number] Rush Farmer MD  OX:[id number]    Source (AFB) PLEURAL  Final    Comment: Performed at [facility], [place] [address], [place], [place] [address]     Labs: BNP (last 3 results) Recent Labs    12/08/21 1717 01/17/22 2035  BNP 192.8* 962.2*   Basic Metabolic Panel: Recent Labs  Lab 01/17/22 1722  NA 140  K 4.7  CL 111  CO2 22  GLUCOSE 95  BUN 19  CREATININE 0.82  CALCIUM 8.1*   Liver Function Tests: Recent Labs  Lab 01/17/22 1722  AST 36  ALT 16  ALKPHOS 61  BILITOT 1.2  PROT 4.9*  ALBUMIN 2.1*   No results for  input(s): "LIPASE", "AMYLASE" in the last 168 hours. No results for input(s): "AMMONIA" in the last 168 hours. CBC: Recent Labs  Lab 01/17/22 1617 01/18/22 0351  WBC 11.7* 10.8*  NEUTROABS 8.7*  --   HGB 14.1 12.3*  HCT 42.4 37.3*  MCV 90.0 89.7  PLT 346 319   Cardiac Enzymes: No results for input(s): "CKTOTAL", "CKMB", "CKMBINDEX", "TROPONINI" in the last 168 hours. BNP: Invalid input(s): "POCBNP" CBG: No results for input(s): "GLUCAP" in the last 168 hours. D-Dimer No results for input(s): "DDIMER" in the last 72 hours. Hgb A1c No results for input(s): "HGBA1C" in the last 72 hours. Lipid Profile No results for input(s): "CHOL", "HDL", "LDLCALC", "TRIG", "CHOLHDL", "LDLDIRECT" in the last 72 hours. Thyroid function studies No results for input(s): "TSH", "T4TOTAL", "T3FREE", "THYROIDAB" in the last 72 hours.  Invalid input(s): "FREET3" Anemia work up No results for input(s): "VITAMINB12", "FOLATE", "FERRITIN", "TIBC", "IRON", "RETICCTPCT" in the last 72 hours. Urinalysis No results found for: "COLORURINE", "APPEARANCEUR", "LABSPEC", "PHURINE", "GLUCOSEU", "HGBUR", "BILIRUBINUR", "KETONESUR", "PROTEINUR", "UROBILINOGEN", "NITRITE", "LEUKOCYTESUR" Sepsis Labs Recent Labs  Lab 01/17/22 1617 01/18/22 0351  WBC 11.7* 10.8*   Microbiology Recent Results (from the past 240 hour(s))  SARS Coronavirus 2 by RT PCR (hospital order, performed in [facility] hospital lab) *cepheid single result test* Anterior Nasal Swab     Status: None   Collection Time: 01/17/22  4:11 PM   Specimen: Anterior Nasal Swab  Result Value Ref Range Status   SARS Coronavirus 2 by RT PCR NEGATIVE NEGATIVE Final    Comment: (NOTE) SARS-CoV-2 target nucleic acids are NOT DETECTED.  The SARS-CoV-2 RNA is generally detectable in upper and lower respiratory specimens during the acute phase of infection. The lowest concentration of SARS-CoV-2 viral copies this assay can detect is 250 copies / mL. A  negative result does not preclude SARS-CoV-2 infection and should not be used as the sole basis for treatment or other patient management decisions.  A negative result may occur with improper specimen collection / handling, submission of specimen other than nasopharyngeal swab, presence of viral mutation(s) within the areas targeted by this assay, and inadequate number of viral copies (<250 copies / mL). A negative result must be combined with clinical observations, patient history, and epidemiological information.  Fact Sheet for Patients:   https://www.patel.info/  Fact Sheet for Healthcare Providers: https://hall.com/  This test is not yet approved or  cleared by the [place] FDA and has been authorized for detection and/or diagnosis of SARS-CoV-2 by FDA under an Emergency Use Authorization (EUA).  This EUA will remain in effect (meaning this test can be used) for the duration of the COVID-19 declaration under Section 564(b)(1) of the Act, 21 U.S.C. section 360bbb-3(b)(1), unless the authorization is terminated or revoked sooner.  Performed at [facility],  [address] [place], [place] [address]   Surgical pcr screen     Status: None   Collection Time: 01/21/22  3:31 AM   Specimen: Nasal Mucosa; Nasal Swab  Result Value Ref Range Status   MRSA, PCR NEGATIVE NEGATIVE Final   Staphylococcus aureus NEGATIVE NEGATIVE Final    Comment: (NOTE) The Xpert SA Assay (FDA approved for NASAL specimens in patients 68 years of age and older), is one component of a comprehensive surveillance program. It is not intended to diagnose infection nor to guide or monitor treatment. Performed at [facility], [place] [address] [place], [place] [address]   Aerobic/Anaerobic Culture w Gram Stain (surgical/deep wound)     Status: None (Preliminary result)   Collection Time: 01/21/22  2:31 PM   Specimen: PATH Cytology Pleural fluid; Body  Fluid  Result Value Ref Range Status   Specimen Description PLEURAL  Final   Special Requests LEFT PLEURAL FLUID  Final   Gram Stain   Final    RARE WBC PRESENT,BOTH PMN AND MONONUCLEAR NO ORGANISMS SEEN    Culture   Final    NO GROWTH 2 DAYS NO ANAEROBES ISOLATED; CULTURE IN PROGRESS FOR 5 DAYS Performed at [facility], [place] [address] [place], [place] [address]    Report Status PENDING  Incomplete  Acid Fast Smear (AFB)     Status: None   Collection Time: 01/21/22  2:31 PM   Specimen: PATH Cytology Pleural fluid; Body Fluid  Result Value Ref Range Status   AFB Specimen Processing Concentration  Final   Acid Fast Smear Negative  Final    Comment: (NOTE) Performed At: [facility] [place], [place] [id number] Rush Farmer MD DQ:[id number]    Source (AFB) PLEURAL  Final    Comment: Performed at [facility], [place] [address], [place], [place] [address]    Procedures/Studies: [place] CHEST PORT 1 VIEW  Result Date: 01/22/2022 CLINICAL DATA:  Ischemic cardiomyopathy.  Postoperative check. EXAM: PORTABLE CHEST 1 VIEW COMPARISON:  January 17, 2022 FINDINGS: The heart size and mediastinal contours are stable. Dense consolidation of the left lung is identified with interval slight better aeration of the left lung base compared prior exam. A left chest tube is identified with the distal tip in the left apex. Subcutaneous emphysema is identified over the left chest. The right lung demonstrate emphysematous changes. The visualized skeletal structures are stable. IMPRESSION: Dense consolidation of the left lung with interval slight better aeration of the left lung base compared prior exam. Left chest tube is identified with the distal tip in the left apex. Electronically Signed   By: Abelardo Diesel M.D.   On: 01/22/2022 07:44   CT CHEST W CONTRAST  Result Date: 01/20/2022 CLINICAL DATA:  Large left pleural effusion. EXAM: CT CHEST WITH CONTRAST TECHNIQUE: Multidetector  CT imaging of the chest was performed during intravenous contrast administration. RADIATION DOSE REDUCTION: This exam was performed according to the departmental dose-optimization program which includes automated exposure control, adjustment of the mA and/or kV according to patient size and/or use of iterative reconstruction technique. CONTRAST:  177m OMNIPAQUE IOHEXOL 300 MG/ML  SOLN COMPARISON:  Recent chest CTs from May 8 and Jan 15, 2022 FINDINGS: Cardiovascular: The heart is normal in size. No pericardial effusion. The aorta is normal in caliber. Stable atherosclerotic calcifications. The pulmonary arteries are patent. Stable three-vessel coronary artery calcifications. Mediastinum/Nodes: Stable mediastinal lymphadenopathy. 2.5 cm AP window node on image number 63/3.  Left hilar nodes versus hilar pleural tumor. The more lateral lesion measures 21 mm and the more medial lesion measures 13 mm. The esophagus is grossly normal. Lungs/Pleura: Very large left pleural effusion increased in size since the prior CT scan. Essentially drowned/obstructed left lung. Extensive enhancing pleural tumor involving the left hemithorax. The heart and mediastinum are displaced to the right because of the large left hydrothorax. The right lung demonstrates advanced emphysematous changes and pulmonary scarring but no pulmonary nodules or right-sided pleural tumor. Upper Abdomen: No significant upper abdominal findings. Advanced vascular calcifications. Musculoskeletal: No significant bony findings. IMPRESSION: 1. Very large left pleural effusion increased in size since the prior CT scan. 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 2. Essentially drowned/obstructed left lung. 3. Extensive enhancing left pleural tumor and mediastinal adenopathy. 4. Advanced emphysematous changes and pulmonary scarring in the right lung but no pulmonary nodules or right-sided pleural tumor. 5.  Stable advanced vascular calcifications. Aortic Atherosclerosis (ICD10-I70.0) and Emphysema (ICD10-J43.9). Electronically Signed   By: Marijo Sanes M.D.   On: 01/20/2022 16:43   CT ABDOMEN PELVIS W CONTRAST  Result Date: 01/17/2022 CLINICAL DATA:  Pain left lower quadrant of abdomen, rectal bleeding EXAM: CT ABDOMEN AND PELVIS WITH CONTRAST TECHNIQUE: Multidetector CT imaging of the abdomen and pelvis was performed using the standard protocol following bolus administration of intravenous contrast. RADIATION DOSE REDUCTION: This exam was performed according to the departmental dose-optimization program which includes automated exposure control, adjustment of the mA and/or kV according to patient size and/or use of iterative reconstruction technique. CONTRAST:  15m OMNIPAQUE IOHEXOL 300 MG/ML  SOLN COMPARISON:  Previous CT chest done on 01/15/2022, chest radiographs done earlier today FINDINGS: Lower chest: Large left pleural effusion is noted. There are peripheral pleural nodules on the left side. There is almost complete atelectasis in the visualized left lower lung fields. There is increase in interstitial markings in the periphery of right lower lung fields possibly suggesting interstitial lung disease. There are scattered coronary artery calcifications. Dense calcification is seen in the region of mitral annulus. Hepatobiliary: In the image 14 of series 4, there is 1.3 cm low-density lesion in the right lobe of liver. There is no significant dilation of bile ducts. Gallbladder is not distended. Pancreas: No focal abnormality is seen. Spleen: There is medial displacement of spleen caused by large left pleural effusion. Adrenals/Urinary Tract: Adrenals are unremarkable. There is no hydronephrosis. Left kidney is displaced medially. There is 7 mm calculus in the midportion of right kidney. Ureters are not dilated. Urinary bladder is unremarkable. Stomach/Bowel: Stomach is not distended. Small bowel loops are  not dilated. Appendix is not dilated. Appendix is noted in the right pelvic cavity. There is no significant wall thickening in colon. There is no pericolic stranding. Vascular/Lymphatic: Extensive calcifications are seen in the abdominal aorta and its major branches. Some of the coarse calcifications are noted projecting into the lumen of abdominal aorta. Reproductive: There is evidence of previous surgical removal of prostate. There are multiple surgical clips in both sides of pelvis. Other: There is no ascites or pneumoperitoneum. Umbilical hernia containing fat is seen. Musculoskeletal: Degenerative changes are noted in both hips, more so on the right side. No definite focal lytic or sclerotic lesions are seen. IMPRESSION: Massive left pleural effusion. There are numerous nodules in the periphery of left pleural space suggesting malignant neoplastic process such as mesothelioma or pleural metastatic disease. There is compression atelectasis of visualized  left lower lung fields. There is prominence of interstitial markings in the periphery of right lower lung fields suggesting scarring from interstitial lung disease. There is no evidence of intestinal obstruction or pneumoperitoneum. Appendix is not dilated. There is no hydronephrosis. There is 7 mm nonobstructing right renal stone. There is 13 mm low-density lesion in the right lobe of liver which is not fully evaluated. This may suggest hemangioma or malignant neoplastic process such as metastatic disease. Other findings as described in the body of the report. Electronically Signed   By: Elmer Picker M.D.   On: 01/17/2022 20:06   DG Chest 2 View  Result Date: 01/17/2022 CLINICAL DATA:  Worsening hypoxia. Known left pleural effusion recurrence EXAM: CHEST - 2 VIEW COMPARISON:  Chest CT 01/15/2022.  Radiograph 12/31/2021 FINDINGS: Large partially loculated left pleural effusion is not significantly changed from recent chest CT. Lateral component tracks  towards the apex. There is underlying left lung opacities. Right lung emphysema without acute findings. Heart size is obscured by lung opacity. No pneumothorax. IMPRESSION: 1. Large partially loculated left pleural effusion, not significantly changed from CT 2 days ago. Underlying left lung opacities were better assessed on recent CT. 2. Emphysema.  No acute right lung findings. Electronically Signed   By: Keith Rake M.D.   On: 01/17/2022 17:08   CT Chest Wo Contrast  Result Date: 01/17/2022 CLINICAL DATA:  Follow-up pneumonia, parapneumonic effusion * Tracking Code: BO * EXAM: CT CHEST WITHOUT CONTRAST TECHNIQUE: Multidetector CT imaging of the chest was performed following the standard protocol without IV contrast. RADIATION DOSE REDUCTION: This exam was performed according to the departmental dose-optimization program which includes automated exposure control, adjustment of the mA and/or kV according to patient size and/or use of iterative reconstruction technique. COMPARISON:  12/23/2021 FINDINGS: Cardiovascular: Aortic atherosclerosis. Aortic valve calcifications. Normal heart size. Subendocardial fibrofatty scarring of the left ventricular apex in keeping with prior infarction (series 2, image 117). Three-vessel coronary artery calcifications. No pericardial effusion. Mediastinum/Nodes: Interval enlargement of pretracheal, AP window, and left hilar lymph nodes, largest AP window node measuring 2.0 x 1.9 cm, previously 1.7 x 1.5 cm (series 2, image 66). Thyroid gland, trachea, and esophagus demonstrate no significant findings. Lungs/Pleura: Interval increase in volume of a left pleural effusion, now large, with extensive associated pleural thickening and nodularity throughout the left hemithorax (series 2, image 138). There is now essentially complete consolidation and or atelectasis of the left upper lobe sans lingula, with proximal obstruction of the left upper lobe bronchus (series 5, image 77).  Moderate to severe underlying centrilobular and paraseptal emphysema. Mild pulmonary fibrosis of the aerated right lung, characterized by irregular peripheral interstitial opacity, septal thickening, and areas of subpleural bronchiolectasis at the right lung base. Upper Abdomen: No acute abnormality. Nonobstructive right nephrolithiasis. Musculoskeletal: No chest wall abnormality. No suspicious osseous lesions identified. IMPRESSION: 1. Interval increase in volume of a left pleural effusion, now large, with extensive associated pleural thickening and nodularity throughout the left hemithorax. There is now essentially complete consolidation and or atelectasis of the left upper lobe sans lingula, with proximal obstruction of the left upper lobe bronchus. Findings are most consistent with underlying primary lung malignancy and associated pleural metastatic disease. 2. Interval enlargement of pretracheal, AP window, and left hilar lymph nodes, which may be reactive but are highly suspicious for nodal metastatic disease. 3. Underlying emphysema. 4. Mild pulmonary fibrosis of the aerated right lung, characterized by irregular peripheral interstitial opacity, septal thickening, and areas of subpleural bronchiolectasis  at the right lung base. Although not optimally characterized by this non tailored examination, findings are categorized as probable UIP per consensus guidelines: Diagnosis of Idiopathic Pulmonary Fibrosis: An Official ATS/ERS/JRS/ALAT Clinical Practice Guideline. [place], Iss 5, [phone], Apr 18 2017. 5. Coronary artery disease. 6. Aortic valve calcifications. Correlate for echocardiographic evidence of aortic valve dysfunction. 7. Nonobstructive right nephrolithiasis. These results will be called to the ordering clinician or representative by the Radiologist Assistant, and communication documented in the PACS or [employer]. Aortic Atherosclerosis (ICD10-I70.0) and Emphysema  (ICD10-J43.9). Electronically Signed   By: Delanna Ahmadi M.D.   On: 01/17/2022 09:00   DG CHEST PORT 1 VIEW  Result Date: 12/31/2021 CLINICAL DATA:  Pneumonia, s/P thoracentesis EXAM: PORTABLE CHEST - 1 VIEW COMPARISON:  12/08/2021 FINDINGS: No pneumothorax. Progressive peripheral consolidation in the left mid lung extending to the hilum. Blunting of the left lateral costophrenic angle suggesting small effusion. Right lung clear. Heart size and mediastinal contours are within normal limits. Aortic Atherosclerosis (ICD10-170.0). Visualized bones unremarkable. IMPRESSION: 1. No pneumothorax. 2. Worsening peripheral left mid lung airspace consolidation with small effusion Electronically Signed   By: Lucrezia Europe M.D.   On: 12/31/2021 15:08     Time coordinating discharge: Over 30 minutes    Dwyane Dee, MD  [facility] 01/23/2022, 4:32 PM

## 2022-01-23 NOTE — Progress Notes (Signed)
Plurex draining and dsg change teaching performed with patient and family (son and daughter).   400cc of pleural fluid drained during teaching.   1 box of supplies delivered to room.   No questions at this time from patient or family members.

## 2022-01-23 NOTE — Progress Notes (Signed)
      [address]       [place] [address]             [phone]      2 Days Post-Op Procedure(s) (LRB): VIDEO ASSISTED THORACOSCOPY (VATS)/DECORTICATION (Left) Subjective: Somewhat more uncomfortable today  Objective: Vital signs in last 24 hours: Temp:  [97.5 F (36.4 C)-98.4 F (36.9 C)] 97.5 F (36.4 C) (06/08 0743) Pulse Rate:  [60-75] 60 (06/08 0743) Cardiac Rhythm: Normal sinus rhythm;Heart block (06/07 2000) Resp:  [15-20] 19 (06/08 0743) BP: (86-112)/(50-67) 86/55 (06/08 0743) SpO2:  [90 %-97 %] 97 % (06/08 0743)  Hemodynamic parameters for last 24 hours:    Intake/Output from previous day: 06/07 0701 - 06/08 0700 In: 240 [P.O.:240] Out: 520 [Chest Tube:520] Intake/Output this shift: No intake/output data recorded.  General appearance: alert, cooperative, fatigued, and no distress Heart: regular rate and rhythm Lungs: dim on left Wound: incis healing well  Lab Results: No results for input(s): "WBC", "HGB", "HCT", "PLT" in the last 72 hours. BMET: No results for input(s): "NA", "K", "CL", "CO2", "GLUCOSE", "BUN", "CREATININE", "CALCIUM" in the last 72 hours.  PT/INR:  Recent Labs    01/20/22 1516  LABPROT 15.1  INR 1.2   ABG    Component Value Date/Time   TCO2 19 11/03/2014 2108   CBG (last 3)  No results for input(s): "GLUCAP" in the last 72 hours.  Meds Scheduled Meds:  ascorbic acid  500 mg Oral Daily   atorvastatin  40 mg Oral QPM   carvedilol  3.125 mg Oral BID WC   cholecalciferol  1,000 Units Oral Daily   Continuous Infusions: PRN Meds:.acetaminophen, albuterol, lidocaine, morphine injection, traMADol  Xrays DG CHEST PORT 1 VIEW  Result Date: 01/22/2022 CLINICAL DATA:  Ischemic cardiomyopathy.  Postoperative check. EXAM: PORTABLE CHEST 1 VIEW COMPARISON:  January 17, 2022 FINDINGS: The heart size and mediastinal contours are stable. Dense consolidation of the left lung is identified with interval slight better  aeration of the left lung base compared prior exam. A left chest tube is identified with the distal tip in the left apex. Subcutaneous emphysema is identified over the left chest. The right lung demonstrate emphysematous changes. The visualized skeletal structures are stable. IMPRESSION: Dense consolidation of the left lung with interval slight better aeration of the left lung base compared prior exam. Left chest tube is identified with the distal tip in the left apex. Electronically Signed   By: Abelardo Diesel M.D.   On: 01/22/2022 07:44    Assessment/Plan: S/P Procedure(s) (LRB): VIDEO ASSISTED THORACOSCOPY (VATS)/DECORTICATION (Left) POD#2 1 afeb, S BP 80's-110's 2 sats ok on 4 liters 3 path c/w small cell carcinoma 4 CT 50 cc last 12 hours, 520 cc /24 h-home health being arranged for pleurx drainage- I put a schedule in the discharge instructions as well as post VATS instructions- will make arrangements for office visit with Dr Kipp Brood      LOS: 6 days    John Giovanni PA-C Pager [phone] 01/23/2022

## 2022-01-23 NOTE — Progress Notes (Signed)
I provided discharge information and discharge paperwork to the patient and his family. They said they understood and did not have any questions or concerns at this time.  Dianah Field, RN

## 2022-01-23 NOTE — Discharge Instructions (Signed)
Daily Pleurix drainage to start tomorrow or at discharge If <150 ml /drainage session x3 consecutive occasions - QOD drainage If <150 ml /drainage session x3 consecutive occasions on  QOD drainage schedule- call [facility] office  [phone]) for evaluation and possible removal

## 2022-01-24 DIAGNOSIS — J910 Malignant pleural effusion: Secondary | ICD-10-CM | POA: Diagnosis not present

## 2022-01-25 DIAGNOSIS — Z8701 Personal history of pneumonia (recurrent): Secondary | ICD-10-CM | POA: Diagnosis not present

## 2022-01-25 DIAGNOSIS — N529 Male erectile dysfunction, unspecified: Secondary | ICD-10-CM | POA: Diagnosis not present

## 2022-01-25 DIAGNOSIS — I2510 Atherosclerotic heart disease of native coronary artery without angina pectoris: Secondary | ICD-10-CM | POA: Diagnosis not present

## 2022-01-25 DIAGNOSIS — Z48813 Encounter for surgical aftercare following surgery on the respiratory system: Secondary | ICD-10-CM | POA: Diagnosis not present

## 2022-01-25 DIAGNOSIS — J8410 Pulmonary fibrosis, unspecified: Secondary | ICD-10-CM | POA: Diagnosis not present

## 2022-01-25 DIAGNOSIS — Z955 Presence of coronary angioplasty implant and graft: Secondary | ICD-10-CM | POA: Diagnosis not present

## 2022-01-25 DIAGNOSIS — Z4801 Encounter for change or removal of surgical wound dressing: Secondary | ICD-10-CM | POA: Diagnosis not present

## 2022-01-25 DIAGNOSIS — I110 Hypertensive heart disease with heart failure: Secondary | ICD-10-CM | POA: Diagnosis not present

## 2022-01-25 DIAGNOSIS — I700 Atherosclerosis of aorta: Secondary | ICD-10-CM | POA: Diagnosis not present

## 2022-01-25 DIAGNOSIS — N200 Calculus of kidney: Secondary | ICD-10-CM | POA: Diagnosis not present

## 2022-01-25 DIAGNOSIS — K760 Fatty (change of) liver, not elsewhere classified: Secondary | ICD-10-CM | POA: Diagnosis not present

## 2022-01-25 DIAGNOSIS — J9601 Acute respiratory failure with hypoxia: Secondary | ICD-10-CM | POA: Diagnosis not present

## 2022-01-25 DIAGNOSIS — I5022 Chronic systolic (congestive) heart failure: Secondary | ICD-10-CM | POA: Diagnosis not present

## 2022-01-25 DIAGNOSIS — I081 Rheumatic disorders of both mitral and tricuspid valves: Secondary | ICD-10-CM | POA: Diagnosis not present

## 2022-01-25 DIAGNOSIS — J910 Malignant pleural effusion: Secondary | ICD-10-CM | POA: Diagnosis not present

## 2022-01-25 DIAGNOSIS — I252 Old myocardial infarction: Secondary | ICD-10-CM | POA: Diagnosis not present

## 2022-01-25 DIAGNOSIS — I480 Paroxysmal atrial fibrillation: Secondary | ICD-10-CM | POA: Diagnosis not present

## 2022-01-25 DIAGNOSIS — M109 Gout, unspecified: Secondary | ICD-10-CM | POA: Diagnosis not present

## 2022-01-25 DIAGNOSIS — J942 Hemothorax: Secondary | ICD-10-CM | POA: Diagnosis not present

## 2022-01-25 DIAGNOSIS — I255 Ischemic cardiomyopathy: Secondary | ICD-10-CM | POA: Diagnosis not present

## 2022-01-25 DIAGNOSIS — J479 Bronchiectasis, uncomplicated: Secondary | ICD-10-CM | POA: Diagnosis not present

## 2022-01-25 DIAGNOSIS — Z8546 Personal history of malignant neoplasm of prostate: Secondary | ICD-10-CM | POA: Diagnosis not present

## 2022-01-25 DIAGNOSIS — C3490 Malignant neoplasm of unspecified part of unspecified bronchus or lung: Secondary | ICD-10-CM | POA: Diagnosis not present

## 2022-01-25 DIAGNOSIS — J439 Emphysema, unspecified: Secondary | ICD-10-CM | POA: Diagnosis not present

## 2022-01-25 DIAGNOSIS — R7303 Prediabetes: Secondary | ICD-10-CM | POA: Diagnosis not present

## 2022-01-26 ENCOUNTER — Other Ambulatory Visit: Payer: Self-pay | Admitting: Cardiovascular Disease

## 2022-01-26 LAB — AEROBIC/ANAEROBIC CULTURE W GRAM STAIN (SURGICAL/DEEP WOUND): Culture: NO GROWTH

## 2022-01-27 ENCOUNTER — Encounter: Payer: Self-pay | Admitting: *Deleted

## 2022-01-27 DIAGNOSIS — J918 Pleural effusion in other conditions classified elsewhere: Secondary | ICD-10-CM | POA: Diagnosis not present

## 2022-01-27 DIAGNOSIS — J9601 Acute respiratory failure with hypoxia: Secondary | ICD-10-CM | POA: Diagnosis not present

## 2022-01-27 DIAGNOSIS — Z4801 Encounter for change or removal of surgical wound dressing: Secondary | ICD-10-CM | POA: Diagnosis not present

## 2022-01-27 DIAGNOSIS — J942 Hemothorax: Secondary | ICD-10-CM | POA: Diagnosis not present

## 2022-01-27 DIAGNOSIS — I4891 Unspecified atrial fibrillation: Secondary | ICD-10-CM | POA: Diagnosis not present

## 2022-01-27 DIAGNOSIS — Z7901 Long term (current) use of anticoagulants: Secondary | ICD-10-CM | POA: Diagnosis not present

## 2022-01-27 DIAGNOSIS — J910 Malignant pleural effusion: Secondary | ICD-10-CM | POA: Diagnosis not present

## 2022-01-27 DIAGNOSIS — K5900 Constipation, unspecified: Secondary | ICD-10-CM | POA: Diagnosis not present

## 2022-01-27 DIAGNOSIS — Z48813 Encounter for surgical aftercare following surgery on the respiratory system: Secondary | ICD-10-CM | POA: Diagnosis not present

## 2022-01-27 DIAGNOSIS — C801 Malignant (primary) neoplasm, unspecified: Secondary | ICD-10-CM | POA: Diagnosis not present

## 2022-01-27 DIAGNOSIS — I2510 Atherosclerotic heart disease of native coronary artery without angina pectoris: Secondary | ICD-10-CM | POA: Diagnosis not present

## 2022-01-27 DIAGNOSIS — C3490 Malignant neoplasm of unspecified part of unspecified bronchus or lung: Secondary | ICD-10-CM | POA: Diagnosis not present

## 2022-01-27 DIAGNOSIS — C61 Malignant neoplasm of prostate: Secondary | ICD-10-CM | POA: Diagnosis not present

## 2022-01-27 DIAGNOSIS — J90 Pleural effusion, not elsewhere classified: Secondary | ICD-10-CM | POA: Diagnosis not present

## 2022-01-27 NOTE — Progress Notes (Signed)
Oncology Nurse Navigator Documentation     01/27/2022    3:00 PM  Oncology Nurse Navigator Flowsheets  Navigator Follow Up Date: 01/30/2022  Navigator Follow Up Reason: New Patient Appointment  Navigator Location [facility]  Referral Date to RadOnc/MedOnc 01/27/2022  Navigator Encounter Type Diagnostic Results  Treatment Phase Pre-Tx/Tx Discussion  Barriers/Navigation Needs Coordination of Care/ I received referral on Pedro Pope today. I notified new patient coordinator to call and schedule him to be seen this week with labs.   Interventions Coordination of Care  Acuity Level 2-Minimal Needs (1-2 Barriers Identified)  Coordination of Care Other  Time Spent with Patient 30

## 2022-01-28 ENCOUNTER — Encounter: Payer: Self-pay | Admitting: *Deleted

## 2022-01-28 NOTE — Progress Notes (Signed)
I sent Pedro Pope a message via Mychart regarding his appt with Dr. Julien Nordmann.

## 2022-01-28 NOTE — Progress Notes (Signed)
Oncology Nurse Navigator Documentation I received a message from Mr. Genis. I called him back and scheduled him to be seen with med onc this week. He verbalized understanding of appt.     01/28/2022    8:00 AM 01/27/2022    3:00 PM  Oncology Nurse Navigator Flowsheets  Navigator Follow Up Date:  01/30/2022  Navigator Follow Up Reason:  New Patient Appointment  Navigator Location [facility]  Referral Date to RadOnc/MedOnc  01/27/2022  Navigator Encounter Type Introductory Phone Call Diagnostic Results  Treatment Phase Pre-Tx/Tx Discussion Pre-Tx/Tx Discussion  Barriers/Navigation Needs Coordination of Care;Education Coordination of Care  Education Other   Interventions Coordination of Care;Education Coordination of Care  Acuity Level 2-Minimal Needs (1-2 Barriers Identified) Level 2-Minimal Needs (1-2 Barriers Identified)  Coordination of Care Appts Other  Education Method Verbal;Other   Time Spent with Patient 01 10

## 2022-01-29 ENCOUNTER — Other Ambulatory Visit (HOSPITAL_COMMUNITY): Payer: Medicare Other

## 2022-01-29 DIAGNOSIS — J910 Malignant pleural effusion: Secondary | ICD-10-CM | POA: Diagnosis not present

## 2022-01-29 DIAGNOSIS — J942 Hemothorax: Secondary | ICD-10-CM | POA: Diagnosis not present

## 2022-01-29 DIAGNOSIS — C3490 Malignant neoplasm of unspecified part of unspecified bronchus or lung: Secondary | ICD-10-CM | POA: Diagnosis not present

## 2022-01-29 DIAGNOSIS — J9601 Acute respiratory failure with hypoxia: Secondary | ICD-10-CM | POA: Diagnosis not present

## 2022-01-29 DIAGNOSIS — Z48813 Encounter for surgical aftercare following surgery on the respiratory system: Secondary | ICD-10-CM | POA: Diagnosis not present

## 2022-01-29 DIAGNOSIS — Z4801 Encounter for change or removal of surgical wound dressing: Secondary | ICD-10-CM | POA: Diagnosis not present

## 2022-01-30 ENCOUNTER — Encounter: Payer: Self-pay | Admitting: Internal Medicine

## 2022-01-30 ENCOUNTER — Inpatient Hospital Stay: Payer: Medicare Other | Attending: Internal Medicine | Admitting: Internal Medicine

## 2022-01-30 ENCOUNTER — Other Ambulatory Visit: Payer: Self-pay

## 2022-01-30 ENCOUNTER — Encounter: Payer: Self-pay | Admitting: *Deleted

## 2022-01-30 ENCOUNTER — Inpatient Hospital Stay: Payer: Medicare Other

## 2022-01-30 VITALS — BP 103/48 | HR 77 | Temp 97.0°F | Resp 18 | Wt 130.1 lb

## 2022-01-30 DIAGNOSIS — J910 Malignant pleural effusion: Secondary | ICD-10-CM | POA: Diagnosis not present

## 2022-01-30 DIAGNOSIS — Z5112 Encounter for antineoplastic immunotherapy: Secondary | ICD-10-CM | POA: Insufficient documentation

## 2022-01-30 DIAGNOSIS — C3482 Malignant neoplasm of overlapping sites of left bronchus and lung: Secondary | ICD-10-CM | POA: Diagnosis not present

## 2022-01-30 DIAGNOSIS — Z4801 Encounter for change or removal of surgical wound dressing: Secondary | ICD-10-CM | POA: Diagnosis not present

## 2022-01-30 DIAGNOSIS — J9601 Acute respiratory failure with hypoxia: Secondary | ICD-10-CM | POA: Diagnosis not present

## 2022-01-30 DIAGNOSIS — C3412 Malignant neoplasm of upper lobe, left bronchus or lung: Secondary | ICD-10-CM | POA: Diagnosis not present

## 2022-01-30 DIAGNOSIS — C801 Malignant (primary) neoplasm, unspecified: Secondary | ICD-10-CM

## 2022-01-30 DIAGNOSIS — K5900 Constipation, unspecified: Secondary | ICD-10-CM | POA: Diagnosis not present

## 2022-01-30 DIAGNOSIS — C3490 Malignant neoplasm of unspecified part of unspecified bronchus or lung: Secondary | ICD-10-CM

## 2022-01-30 DIAGNOSIS — M7989 Other specified soft tissue disorders: Secondary | ICD-10-CM | POA: Insufficient documentation

## 2022-01-30 DIAGNOSIS — Z48813 Encounter for surgical aftercare following surgery on the respiratory system: Secondary | ICD-10-CM | POA: Diagnosis not present

## 2022-01-30 DIAGNOSIS — J942 Hemothorax: Secondary | ICD-10-CM | POA: Diagnosis not present

## 2022-01-30 DIAGNOSIS — Z5111 Encounter for antineoplastic chemotherapy: Secondary | ICD-10-CM | POA: Insufficient documentation

## 2022-01-30 LAB — CMP (CANCER CENTER ONLY)
ALT: 76 U/L — ABNORMAL HIGH (ref 0–44)
AST: 64 U/L — ABNORMAL HIGH (ref 15–41)
Albumin: 2.5 g/dL — ABNORMAL LOW (ref 3.5–5.0)
Alkaline Phosphatase: 74 U/L (ref 38–126)
Anion gap: 6 (ref 5–15)
BUN: 19 mg/dL (ref 8–23)
CO2: 28 mmol/L (ref 22–32)
Calcium: 8.8 mg/dL — ABNORMAL LOW (ref 8.9–10.3)
Chloride: 101 mmol/L (ref 98–111)
Creatinine: 0.89 mg/dL (ref 0.61–1.24)
GFR, Estimated: >60 mL/min (ref >60)
Glucose, Bld: 103 mg/dL — ABNORMAL HIGH (ref 70–99)
Potassium: 4.7 mmol/L (ref 3.5–5.1)
Sodium: 135 mmol/L (ref 135–145)
Total Bilirubin: 0.5 mg/dL (ref 0.3–1.2)
Total Protein: 5.8 g/dL — ABNORMAL LOW (ref 6.5–8.1)

## 2022-01-30 LAB — CBC WITH DIFFERENTIAL (CANCER CENTER ONLY)
Abs Immature Granulocytes: 0.1 10*3/uL — ABNORMAL HIGH (ref 0.00–0.07)
Basophils Absolute: 0 10*3/uL (ref 0.0–0.1)
Basophils Relative: 0 %
Eosinophils Absolute: 0.1 10*3/uL (ref 0.0–0.5)
Eosinophils Relative: 1 %
HCT: 37.7 % — ABNORMAL LOW (ref 39.0–52.0)
Hemoglobin: 12.6 g/dL — ABNORMAL LOW (ref 13.0–17.0)
Immature Granulocytes: 1 %
Lymphocytes Relative: 14 %
Lymphs Abs: 1.9 10*3/uL (ref 0.7–4.0)
MCH: 29 pg (ref 26.0–34.0)
MCHC: 33.4 g/dL (ref 30.0–36.0)
MCV: 86.9 fL (ref 80.0–100.0)
Monocytes Absolute: 1.3 10*3/uL — ABNORMAL HIGH (ref 0.1–1.0)
Monocytes Relative: 9 %
Neutro Abs: 10.4 10*3/uL — ABNORMAL HIGH (ref 1.7–7.7)
Neutrophils Relative %: 75 %
Platelet Count: 462 10*3/uL — ABNORMAL HIGH (ref 150–400)
RBC: 4.34 MIL/uL (ref 4.22–5.81)
RDW: 13.3 % (ref 11.5–15.5)
WBC Count: 13.9 10*3/uL — ABNORMAL HIGH (ref 4.0–10.5)
nRBC: 0 % (ref 0.0–0.2)

## 2022-01-30 MED ORDER — PROCHLORPERAZINE MALEATE 10 MG PO TABS: 10.0000 mg | ORAL_TABLET | Freq: Four times a day (QID) | ORAL | 0 refills | Status: DC | PRN

## 2022-01-30 NOTE — Patient Instructions (Addendum)
Thank you for choosing [facility] to provide your care.   Should you have questions after your visit to the [facility]), please contact this office at [phone] between 8:30 AM and 4:30 PM.  Voice mails left after 4:00 PM may not be returned until the following business day.  Calls received after 4:30 PM will be answered by an off-site Nurse Triage Line.    Prescription Refills:  Please have your pharmacy contact us directly for most prescription requests.  Contact the office directly for refills of narcotics (pain medications). Allow 48-72 hours for refills.  Appointments: Please contact the [facility] scheduling department [phone] for questions regarding [facility] appointment scheduling.  Contact the schedulers with any scheduling changes so that your appointment can be rescheduled in a timely manner.   Central Scheduling for [facility] [phone] - Call to schedule procedures such as PET scans, CT scans, MRI, Ultrasound, etc.  To afford each patient quality time with our providers, please arrive 30 minutes before your scheduled appointment time.  If you arrive late for your appointment, you may be asked to reschedule.  We strive to give you quality time with our providers, and arriving late affects you and other patients whose appointments are after yours. If you are a no show for multiple scheduled visits, you may be dismissed from the clinic at the providers discretion.     Resources: [employer] [phone] for additional information on assistance programs or assistance connecting with community support programs   [place] [employer]  [phone] 837-5428: Information regarding food stamps, Medicaid, and utility assistance [employer] [place] [employer]'s shared-ride transportation service for eligible riders who have a disability that prevents them from riding the fixed route bus.   [place] 9018080811  Helps people with Medicare understand their rights and benefits, navigate the Medicare system, and secure the quality healthcare they deserve American Cancer Society [phone] Assists patients locate various types of support and financial assistance Cancer Care: 1-800-813-HOPE (204)826-8112) Provides financial assistance, online support groups, medication/co-pay assistance.   Transportation Assistance for appointments at [facility]:[phone]  Again, thank you for choosing [facility] for your care.       Lung Cancer Lung cancer is an abnormal growth of cancerous cells that forms a mass (malignant tumor) in a lung. There are several types of lung cancer. The types are based on the appearance of the tumor cells. The two most common types are: Non-small cell lung cancer. This type of lung cancer is the most common type. Non-small cell lung cancers include squamous cell carcinoma, adenocarcinoma, and large cell carcinoma. Small cell lung cancer. In this type of lung cancer, abnormal cells are smaller than those of non-small cell lung cancer. Small cell lung cancer gets worse (progresses) faster than non-small cell lung cancer. What are the causes? The most common cause of lung cancer is smoking tobacco. The second most common cause is exposure to a chemical called radon. What increases the risk? You are more likely to develop this condition if: You smoke tobacco. You have been exposed to: Secondhand tobacco smoke. Radon gas. Uranium. Asbestos. Arsenic in drinking water. Air pollution and diesel exhaust. You have a family or personal history of lung cancer. You have had lung radiation therapy in the past. You are older than age 44. What are the signs or symptoms? In the early stages, you may not have any symptoms. As the cancer progresses, symptoms may include: A  lasting cough, possibly with blood. Fatigue. Unexplained weight loss. Shortness of breath. High-pitched whistling  sounds when you breathe, most often when you breathe out (wheezing). Chest pain. Loss of appetite. Symptoms of advanced lung cancer include: Hoarseness. Bone or joint pain. Weakness. Change in the structure of the fingernails (clubbing), so that the nail looks like an upside-down spoon. Swelling of the face or arms. Inability to move the face (paralysis). Drooping eyelids. How is this diagnosed? This condition may be diagnosed based on: Your symptoms and medical history. A physical exam. A chest X-ray. A CT scan. Blood tests. Sputum tests. Removal of a sample of lung tissue (lung biopsy) for testing. Your cancer will be assessed (staged) to determine how severe it is and how much it has spread (metastasized). How is this treated? Treatment depends on the type and stage of your cancer. Treatment may include one or more of the following: Surgery to remove as much of the cancer as possible. Lymph nodes in the area may be removed and tested for cancer as well. Medicines that kill cancer cells (chemotherapy). High-energy rays that kill cancer cells (radiation therapy). Targeted therapy. This targets specific parts of cancer cells and the area around them to block the growth and spread of the cancer. Targeted therapy can help limit the damage to healthy cells. Immunotherapy. This treatment uses a person's own immune system to fight cancer by either boosting the immune system or changing how the immune system works. Follow these instructions at home:  Do not use any products that contain nicotine or tobacco. These products include cigarettes, chewing tobacco, and vaping devices, such as e-cigarettes. If you need help quitting, ask your health care provider. Do not drink alcohol. If you are admitted to the hospital, make sure your cancer specialist (oncologist) is aware. Your cancer may affect your treatment for other conditions. Take over-the-counter and prescription medicines only as told  by your health care provider. Work with your health care provider to manage any side effects of treatment. Keep all follow-up visits. This is important. Where to find support Consider joining a local support group for people who have been diagnosed with lung cancer. Where to find more information American Cancer Society: www.cancer.[place]): www.cancer.gov Contact a health care provider if you: Lose weight without trying. Have a persistent cough and wheezing. Feel short of breath. Get tired easily. Have bone or joint pain. Have difficulty swallowing. Notice that your voice is changing or getting hoarse. Have pain that does not get better with medicine. Get help right away if you: Cough up blood. Have chest pain or new breathing problems. Have a fever. Have swelling in an ankle, leg, or arm, or the face or neck. Have paralysis in your face. Are very confused. Have a drooping eyelid. These symptoms may represent a serious problem that is an emergency. Do not wait to see if the symptoms will go away. Get medical help right away. Call your local emergency services (911 in the [place] Do not drive yourself to the hospital. Summary Lung cancer is an abnormal growth of cancerous cells that forms a mass (malignant tumor) in a lung. There are several types of lung cancer. The types are based on the appearance of the tumor cells. The two most common types are non-small cell and small cell. The most common cause of lung cancer is smoking tobacco. Early symptoms include a lasting cough, possibly with blood, and fatigue, unexplained weight loss, and shortness of breath. After diagnosis,  treatment depends on the type and stage of your cancer. This information is not intended to replace advice given to you by your health care provider. Make sure you discuss any questions you have with your health care provider. Document Revised: 01/23/2021 Document Reviewed:  01/23/2021 Elsevier Patient Education  [place] Injection What is this medication? TRILACICLIB (trye la sye klib) decreases the risk of damage to your red blood cells, white blood cells, and platelets from chemotherapy. This medicine may be used for other purposes; ask your health care provider or pharmacist if you have questions. COMMON BRAND NAME(S): COSELA What should I tell my care team before I take this medication? They need to know if you have any of these conditions: an unusual or allergic reaction to trilaciclib, other medicines, foods, dyes, or preservatives pregnant or trying to get pregnant breast-feeding How should I use this medication? This medicine is injected into a vein. It is given by a health care provider in a hospital or clinic setting. Talk to your health care provider about the use of this medicine in children. Special care may be needed. Overdosage: If you think you have taken too much of this medicine contact a poison control center or emergency room at once. NOTE: This medicine is only for you. Do not share this medicine with others. What if I miss a dose? Keep appointments for follow-up doses. It is important to not miss your dose. Call your health care provider if you are unable to keep an appointment. What may interact with this medication? cisplatin dalfampridine dofetilide metformin This list may not describe all possible interactions. Give your health care provider a list of all the medicines, herbs, non-prescription drugs, or dietary supplements you use. Also tell them if you smoke, drink alcohol, or use illegal drugs. Some items may interact with your medicine. What should I watch for while using this medication? Your condition will be monitored carefully while you are receiving this medicine. Do not become pregnant while taking this medicine or for at least 3 weeks after stopping it. Women should inform their health care provider  if they wish to become pregnant or think they might be pregnant. There is a potential for serious harm to an unborn child. Talk to your health care provider for more information. Do not breast-feed an infant while taking this medicine or for at least 3 weeks after stopping it. This medicine may make it more difficult to get pregnant. Talk to your health care provider if you are concerned about your fertility. What side effects may I notice from receiving this medication? Side effects that you should report to your doctor or health care professional as soon as possible: allergic reactions (skin rash; itching or hives; swelling of the face, lips, or tongue) blood clot (chest pain; shortness of breath; pain, swelling, or warmth in the leg) cough pain, redness, or irritation at site where injected trouble breathing Side effects that usually do not require medical attention (report these to your doctor or health care professional if they continue or are bothersome): edema (sudden weight gain; swelling of the ankles, feet, hands or other unusual swelling) headache stomach pain unusually weak or tired This list may not describe all possible side effects. Call your doctor for medical advice about side effects. You may report side effects to FDA at 1-800-FDA-1088. Where should I keep my medication? This medicine is given in a hospital or clinic. It will not be stored at home. NOTE: This sheet is  a summary. It may not cover all possible information. If you have questions about this medicine, talk to your doctor, pharmacist, or health care provider.  2023 Elsevier/Gold Standard (2021-07-05 00:00:00) Durvalumab injection What is this medication? DURVALUMAB (dur VAL ue mab) is a monoclonal antibody. It is used to treat lung cancer. This medicine may be used for other purposes; ask your health care provider or pharmacist if you have questions. COMMON BRAND NAME(S): IMFINZI What should I tell my care team  before I take this medication? They need to know if you have any of these conditions: autoimmune diseases like Crohn's disease, ulcerative colitis, or lupus have had or planning to have an allogeneic stem cell transplant (uses someone else's stem cells) history of organ transplant history of radiation to the chest nervous system problems like myasthenia gravis or Guillain-Barre syndrome an unusual or allergic reaction to durvalumab, other medicines, foods, dyes, or preservatives pregnant or trying to get pregnant breast-feeding How should I use this medication? This medicine is for infusion into a vein. It is given by a health care professional in a hospital or clinic setting. A special MedGuide will be given to you before each treatment. Be sure to read this information carefully each time. Talk to your pediatrician regarding the use of this medicine in children. Special care may be needed. Overdosage: If you think you have taken too much of this medicine contact a poison control center or emergency room at once. NOTE: This medicine is only for you. Do not share this medicine with others. What if I miss a dose? It is important not to miss your dose. Call your doctor or health care professional if you are unable to keep an appointment. What may interact with this medication? Interactions have not been studied. This list may not describe all possible interactions. Give your health care provider a list of all the medicines, herbs, non-prescription drugs, or dietary supplements you use. Also tell them if you smoke, drink alcohol, or use illegal drugs. Some items may interact with your medicine. What should I watch for while using this medication? This medication may make you feel generally unwell. Continue your course of treatment even though you feel ill unless your care team tells you to stop. You may need blood work done while you are taking this medication. Do not become pregnant while  taking this medication or for 3 months after stopping it. Women should inform their care team if they wish to become pregnant or think they might be pregnant. There is a potential for serious side effects to an unborn child. Talk to your care team or pharmacist for more information. Do not breast-feed an infant while taking this medication or for 3 months after stopping it. What side effects may I notice from receiving this medication? Side effects that you should report to your care team as soon as possible: Allergic reactions--skin rash, itching, hives, swelling of the face, lips, tongue, or throat Bloody or watery diarrhea Dizziness, loss of balance or coordination, confusion or trouble speaking Dry cough, shortness of breath or trouble breathing Flushing, mostly over the face, neck, and chest, during injection High blood sugar (hyperglycemia)--increased thirst or amount of urine, unusual weakness or fatigue, blurry vision High thyroid levels (hyperthyroidism)--fast or irregular heartbeat, weight loss, excessive sweating or sensitivity to heat, tremors or shaking, anxiety, nervousness, irregular menstrual cycle or spotting Infection--fever, chills, cough, or sore throat Liver injury--right upper belly pain, loss of appetite, nausea, light-colored stool, dark yellow or brown  urine, yellowing skin or eyes, unusual weakness or fatigue Low adrenal gland function--nausea, vomiting, loss of appetite, unusual weakness or fatigue, dizziness, low blood pressure Low thyroid levels (hypothyroidism)--unusual weakness or fatigue, increased sensitivity to cold, constipation, hair loss, dry skin, weight gain, feelings of depression Pancreatitis--severe stomach pain that spreads to your back or gets worse after eating or when touched, fever, nausea, vomiting Rash, fever, and swollen lymph nodes Redness, blistering, peeling or loosening of the skin, including inside the mouth Wheezing--trouble breathing with loud  or whistling sounds Side effects that usually do not require medical attention (report these to your care team if they continue or are bothersome): Fatigue Hair loss This list may not describe all possible side effects. Call your doctor for medical advice about side effects. You may report side effects to FDA at 1-800-FDA-1088. Where should I keep my medication? This medication is given in a hospital or clinic. It will not be stored at home. NOTE: This sheet is a summary. It may not cover all possible information. If you have questions about this medicine, talk to your doctor, pharmacist, or health care provider.  2023 Elsevier/Gold Standard (2021-07-05 00:00:00) Etoposide, VP-16 injection What is this medication? ETOPOSIDE, VP-16 (e toe POE side) is a chemotherapy drug. It is used to treat testicular cancer, lung cancer, and other cancers. This medicine may be used for other purposes; ask your health care provider or pharmacist if you have questions. COMMON BRAND NAME(S): Etopophos, Toposar, VePesid What should I tell my care team before I take this medication? They need to know if you have any of these conditions: infection kidney disease liver disease low blood counts, like low white cell, platelet, or red cell counts an unusual or allergic reaction to etoposide, other medicines, foods, dyes, or preservatives pregnant or trying to get pregnant breast-feeding How should I use this medication? This medicine is for infusion into a vein. It is administered in a hospital or clinic by a specially trained health care professional. Talk to your pediatrician regarding the use of this medicine in children. Special care may be needed. Overdosage: If you think you have taken too much of this medicine contact a poison control center or emergency room at once. NOTE: This medicine is only for you. Do not share this medicine with others. What if I miss a dose? It is important not to miss your dose.  Call your doctor or health care professional if you are unable to keep an appointment. What may interact with this medication? This medicine may interact with the following medications: warfarin This list may not describe all possible interactions. Give your health care provider a list of all the medicines, herbs, non-prescription drugs, or dietary supplements you use. Also tell them if you smoke, drink alcohol, or use illegal drugs. Some items may interact with your medicine. What should I watch for while using this medication? Visit your doctor for checks on your progress. This drug may make you feel generally unwell. This is not uncommon, as chemotherapy can affect healthy cells as well as cancer cells. Report any side effects. Continue your course of treatment even though you feel ill unless your doctor tells you to stop. In some cases, you may be given additional medicines to help with side effects. Follow all directions for their use. Call your doctor or health care professional for advice if you get a fever, chills or sore throat, or other symptoms of a cold or flu. Do not treat yourself. This drug decreases  your body's ability to fight infections. Try to avoid being around people who are sick. This medicine may increase your risk to bruise or bleed. Call your doctor or health care professional if you notice any unusual bleeding. Talk to your doctor about your risk of cancer. You may be more at risk for certain types of cancers if you take this medicine. Do not become pregnant while taking this medicine or for at least 6 months after stopping it. Women should inform their doctor if they wish to become pregnant or think they might be pregnant. Women of child-bearing potential will need to have a negative pregnancy test before starting this medicine. There is a potential for serious side effects to an unborn child. Talk to your health care professional or pharmacist for more information. Do not  breast-feed an infant while taking this medicine. Men must use a latex condom during sexual contact with a woman while taking this medicine and for at least 4 months after stopping it. A latex condom is needed even if you have had a vasectomy. Contact your doctor right away if your partner becomes pregnant. Do not donate sperm while taking this medicine and for at least 4 months after you stop taking this medicine. Men should inform their doctors if they wish to father a child. This medicine may lower sperm counts. What side effects may I notice from receiving this medication? Side effects that you should report to your doctor or health care professional as soon as possible: allergic reactions like skin rash, itching or hives, swelling of the face, lips, or tongue low blood counts - this medicine may decrease the number of white blood cells, red blood cells, and platelets. You may be at increased risk for infections and bleeding nausea, vomiting redness, blistering, peeling or loosening of the skin, including inside the mouth signs and symptoms of infection like fever; chills; cough; sore throat; pain or trouble passing urine signs and symptoms of low red blood cells or anemia such as unusually weak or tired; feeling faint or lightheaded; falls; breathing problems unusual bruising or bleeding Side effects that usually do not require medical attention (report to your doctor or health care professional if they continue or are bothersome): changes in taste diarrhea hair loss loss of appetite mouth sores This list may not describe all possible side effects. Call your doctor for medical advice about side effects. You may report side effects to FDA at 1-800-FDA-1088. Where should I keep my medication? This drug is given in a hospital or clinic and will not be stored at home. NOTE: This sheet is a summary. It may not cover all possible information. If you have questions about this medicine, talk to your  doctor, pharmacist, or health care provider.  2023 Elsevier/Gold Standard (2021-07-05 00:00:00) Carboplatin injection What is this medication? CARBOPLATIN (KAR boe pla tin) is a chemotherapy drug. It targets fast dividing cells, like cancer cells, and causes these cells to die. This medicine is used to treat ovarian cancer and many other cancers. This medicine may be used for other purposes; ask your health care provider or pharmacist if you have questions. COMMON BRAND NAME(S): Paraplatin What should I tell my care team before I take this medication? They need to know if you have any of these conditions: blood disorders hearing problems kidney disease recent or ongoing radiation therapy an unusual or allergic reaction to carboplatin, cisplatin, other chemotherapy, other medicines, foods, dyes, or preservatives pregnant or trying to get pregnant breast-feeding How  should I use this medication? This drug is usually given as an infusion into a vein. It is administered in a hospital or clinic by a specially trained health care professional. Talk to your pediatrician regarding the use of this medicine in children. Special care may be needed. Overdosage: If you think you have taken too much of this medicine contact a poison control center or emergency room at once. NOTE: This medicine is only for you. Do not share this medicine with others. What if I miss a dose? It is important not to miss a dose. Call your doctor or health care professional if you are unable to keep an appointment. What may interact with this medication? medicines for seizures medicines to increase blood counts like filgrastim, pegfilgrastim, sargramostim some antibiotics like amikacin, gentamicin, neomycin, streptomycin, tobramycin vaccines Talk to your doctor or health care professional before taking any of these medicines: acetaminophen aspirin ibuprofen ketoprofen naproxen This list may not describe all possible  interactions. Give your health care provider a list of all the medicines, herbs, non-prescription drugs, or dietary supplements you use. Also tell them if you smoke, drink alcohol, or use illegal drugs. Some items may interact with your medicine. What should I watch for while using this medication? Your condition will be monitored carefully while you are receiving this medicine. You will need important blood work done while you are taking this medicine. This drug may make you feel generally unwell. This is not uncommon, as chemotherapy can affect healthy cells as well as cancer cells. Report any side effects. Continue your course of treatment even though you feel ill unless your doctor tells you to stop. In some cases, you may be given additional medicines to help with side effects. Follow all directions for their use. Call your doctor or health care professional for advice if you get a fever, chills or sore throat, or other symptoms of a cold or flu. Do not treat yourself. This drug decreases your body's ability to fight infections. Try to avoid being around people who are sick. This medicine may increase your risk to bruise or bleed. Call your doctor or health care professional if you notice any unusual bleeding. Be careful brushing and flossing your teeth or using a toothpick because you may get an infection or bleed more easily. If you have any dental work done, tell your dentist you are receiving this medicine. Avoid taking products that contain aspirin, acetaminophen, ibuprofen, naproxen, or ketoprofen unless instructed by your doctor. These medicines may hide a fever. Do not become pregnant while taking this medicine. Women should inform their doctor if they wish to become pregnant or think they might be pregnant. There is a potential for serious side effects to an unborn child. Talk to your health care professional or pharmacist for more information. Do not breast-feed an infant while taking this  medicine. What side effects may I notice from receiving this medication? Side effects that you should report to your doctor or health care professional as soon as possible: allergic reactions like skin rash, itching or hives, swelling of the face, lips, or tongue signs of infection - fever or chills, cough, sore throat, pain or difficulty passing urine signs of decreased platelets or bleeding - bruising, pinpoint red spots on the skin, black, tarry stools, nosebleeds signs of decreased red blood cells - unusually weak or tired, fainting spells, lightheadedness breathing problems changes in hearing changes in vision chest pain high blood pressure low blood counts - This drug may  decrease the number of white blood cells, red blood cells and platelets. You may be at increased risk for infections and bleeding. nausea and vomiting pain, swelling, redness or irritation at the injection site pain, tingling, numbness in the hands or feet problems with balance, talking, walking trouble passing urine or change in the amount of urine Side effects that usually do not require medical attention (report to your doctor or health care professional if they continue or are bothersome): hair loss loss of appetite metallic taste in the mouth or changes in taste This list may not describe all possible side effects. Call your doctor for medical advice about side effects. You may report side effects to FDA at 1-800-FDA-1088. Where should I keep my medication? This drug is given in a hospital or clinic and will not be stored at home. NOTE: This sheet is a summary. It may not cover all possible information. If you have questions about this medicine, talk to your doctor, pharmacist, or health care provider.  2023 Elsevier/Gold Standard (2008-01-12 00:00:00) Magnetic Resonance Imaging Magnetic resonance imaging (MRI) is a painless test that produces detailed images of organs and tissues inside the body without  using X-rays. During an MRI, strong magnets and radio waves work together to form images. MRI images may provide more details about a medical condition than X-rays, CT scans, and ultrasounds can provide. For a standard MRI, you will lie on a table that slides into a tunnel. In an open MRI, the tunnel will be open at the sides. In some cases, dye (contrast material) may be injected into your bloodstream to make the MRI images even clearer. Tell a health care provider about: Any allergies you have. All medicines you are taking, including vitamins, herbs, eye drops, creams, and over-the-counter medicines. Any surgeries you have had. Any medical conditions you have. Any metal you may have in your body. The magnets used in an MRI can cause metal objects in your body to move. Metal can also make it difficult to get clear images. Objects that may contain metal include: Any joint replacement (prosthesis), such as an artificial knee or hip. An implanted defibrillator, pacemaker, or neurostimulator. A metallic ear implant (cochlear implant). An artificial heart valve. A metallic object in the eye. Metal splinters. Bullet fragments. A port for delivering insulin or chemotherapy. Any tattoos you have. Some of the darker inks can cause problems with testing. Whether you are using a birth control implant such as an intrauterine device (IUD). Whether you are pregnant, may be pregnant, or are breastfeeding. Any fear of cramped spaces (claustrophobia). If this is a problem, it usually can be managed with medicines given prior to the MRI. What are the risks? Generally, this is a safe test. However, problems may occur, such as: If you have metal in your body and it is close to the area being tested, it may be hard to get high-quality images. If you are pregnant, you should avoid MRI tests during the first three months of pregnancy. An MRI may affect an unborn baby. If dye is used: You may need to stop  breastfeeding until the dye leaves your body naturally, if this applies. There is a risk of an allergic reaction to the dye. You can take medicines to prevent this reaction or to treat it if you have allergy symptoms. The dye can cause damage to your kidneys. Drinking plenty of water before and after the procedure can help prevent this problem. What happens before the procedure? You will  be asked to remove all metal, including: A watch, jewelry (including jewelry in piercings), and other metal objects. Hearing aids. Dentures. An underwire bra. Makeup. Some makeup contains small amounts of metal. Braces and fillings are normally not a problem. If you are breastfeeding, ask your health care provider if you need to pump before your test. You may need to stop breastfeeding temporarily if dye will be used. What happens during the procedure?  You may be given earplugs or headphones to listen to music. The MRI machine can be noisy. You will lie flat on your back on a long table. If dye will be used, an IV will be inserted into one of your veins. Dye will be injected into your IV and travel through your bloodstream. The table will slide into a tunnel that has magnets inside. When you are inside the tunnel, you will still be able to talk to your health care provider. You will be asked to lie very still while images are taken. Your health care provider will tell you when you can move. You may have to wait a few minutes to make sure that the images produced during the test are clear. When all images are produced, the table will slide out of the tunnel. The procedure can last from 30 minutes to over an hour. The procedure may vary among health care providers and hospitals. What can I expect after the procedure? You may be taken to a recovery area if sedation medicines were used. Your blood pressure, heart rate, breathing rate, and blood oxygen level will be monitored until you leave the hospital or  clinic. If dye was used: It will leave your body through your urine within a day. You may be told to drink plenty of fluids to help flush the dye out of your system. Do not breastfeed your child until your health care provider says that this is safe. Follow these instructions at home: You may return to your normal activities right away, or as told by your health care provider. It is up to you to get your test results. Ask your health care provider, or the department that is doing the test, when your results will be ready. Keep all follow-up visits. This is important. Talk with your health care provider about what your test results mean. Summary Magnetic resonance imaging (MRI) is a painless test that produces detailed pictures of the inside of your body without using X-rays. Strong magnets and radio waves work together to form very detailed and clear images. In some cases, dye (contrast material) may be injected into your body to make MRI images even clearer. Before your MRI, be sure to tell your health care provider about any metal you may have in your body. Talk with your health care provider about what your test results mean. This information is not intended to replace advice given to you by your health care provider. Make sure you discuss any questions you have with your health care provider. Document Revised: 04/17/2021 Document Reviewed: 12/07/2019 Elsevier Patient Education  [place]. PET Scan A PET scan, or positron emission tomography, is an imaging test. A large scanner with a camera creates pictures of your organs and tissues. The pictures are used to study and detect diseases such as cancer. For this test, a tiny amount of radioactive material, called a tracer, will be injected into your blood. The tracer will travel through your bloodstream and create colors and brightness on the pictures if there are problems. On  a PET scan image, for example, cancer tissue appears brighter  than normal tissue. Tell a health care provider about: Any allergies you have. All medicines you are taking, including vitamins, herbs, eye drops, creams, and over-the-counter medicines. Any blood disorders you have. Any surgeries you have had. Any medical conditions you have. Any fear of cramped spaces (claustrophobia). You may be given medicine to help you relax (sedative) or a medicine to treat anxiety if this is a problem. Any trouble you have staying still for long periods of time. Whether you are pregnant, may be pregnant, or are breastfeeding. What are the risks? Generally, this is a safe test. However, problems may occur, including: Bleeding, pain, or swelling at the IV site. Allergic reaction to the radioactive tracer. This is rare. Exposure to radiation (a small amount). What happens before the procedure? Follow instructions from your health care provider about eating or drinking restrictions. Ask your health care provider about: Changing or stopping your regular medicines. This is especially important if you are taking diabetes medicines or blood thinners. Taking medicines such as aspirin and ibuprofen. These medicines can thin your blood. Do not take these medicines unless your health care provider tells you to take them. Taking over-the-counter medicines, vitamins, herbs, and supplements. If you have diabetes, ask your health care provider for diet guidelines to control your blood sugar (glucose) levels on the day of the test. What happens during the procedure?  An IV will be inserted into one of your veins. You may be given a sedative through the IV. A small amount of radioactive tracer will be injected through the IV. You will wait 30-60 minutes after the injection. This allows the tracer to travel through your body. You will lie on a cushioned table. The table will move through the center of the scanning machine. The scanning machine will create pictures of your body.  This can last for about 30-60 minutes. You will need to stay very still during this time. The procedure may vary among health care providers and hospitals. What can I expect after the procedure? You may be taken to a recovery area if sedation medicines were used. Your blood pressure, heart rate, breathing rate, and blood oxygen level will be monitored until you leave the hospital or clinic. It is up to you to get your test results. Ask your health care provider, or the department that is doing the test, when your results will be ready. Also ask: How will I get my results? What are my treatment options? What other tests do I need? What are my next steps? Follow these instructions at home: Drink 6-8 glasses of water after the test to flush the radioactive tracer out of your body. Drink enough fluid to keep your urine pale yellow. You may return to your normal diet and activities as told by your health care provider. Summary A PET scan, or positron emission tomography, is an imaging test that creates pictures of your organs and tissues. The pictures are used to study and detect diseases such as cancer. For this test, a small amount of radioactive tracer will be injected and travel through your bloodstream. You will lie flat on a table and be very still. The table will move through the scanning machine that creates pictures of your body. If the images show colors and brightness, that is a sign of a problem in that area. Ask your health care provider what your results mean. This information is not intended to replace advice given  to you by your health care provider. Make sure you discuss any questions you have with your health care provider. Document Revised: 04/17/2021 Document Reviewed: 12/10/2019 Elsevier Patient Education  [place].

## 2022-01-30 NOTE — Progress Notes (Signed)
[place] [phone]   [phone]  CONSULT NOTE  REFERRING PHYSICIAN: Dr. Melodie Bouillon  REASON FOR CONSULTATION:  81 years old white male recently diagnosed with lung cancer.  HPI Pedro Pope. is a 81 y.o. male with past medical history significant for coronary artery disease, Dressler syndrome, gout, history of prostate cancer diagnosed in 1996 status post surgery and then XRT in 2007 with radiotherapy induced cystitis and hemorrhage.  He also has a history of ischemic cardiomyopathy and rheumatic fever and history for smoking but quit in 1994.  The patient was seen at the emergency department on 12/08/2021 complaining of chest pain, cough and fever.  He had chest x-ray performed at that time and it showed moderate to high-grade lateral left upper lobe heterogeneous airspace opacification suspicious for pneumonia.  CT scan of the chest without contrast was performed on 12/23/2021 and it showed severe left upper lobe pneumonia with mucoid impaction and nodular areas.  There was mediastinal and left hilar adenopathy likely reactive in addition to moderate size left pleural effusion.  The patient has no improvement in his condition and he had repeat CT scan of the chest without contrast on Jan 15, 2022 and it showed interval increase in the volume of a left pleural effusion that is now large with extensive associated pleural thickening and nodularity throughout the left hemothorax with complete consolidation and/or atelectasis of the left upper lobe and lingula with proximal obstruction of the left upper lobe bronchus.  There was also interval enlargement of pretracheal, AP window and left hilar lymph nodes the largest AP window node measures 2.0 x 1.9 cm.  Repeat CT scan of the chest on 01/20/2022 showed very large left pleural effusion increased in size since the prior scan and this hydrothorax is causing mass effect on the heart and mediastinum which is  displaced to the right.  There was essentially drowned/obstructed left lung and extensive enhancing of the left pleural tumor and mediastinal adenopathy.  The patient was seen by Dr. Kipp Brood and on 01/21/2022 he underwent left Pleurx catheter placement for drainage of the left pleural effusion with pleural biopsy. The final pathology [id number]) was consistent with small cell carcinoma. The patient was referred to me today for evaluation and recommendation regarding treatment of his condition.  He continues to have drainage of the left pleural effusion around 450-600 mL on daily basis. When seen today the patient continues to complain of left-sided pain and swelling as well as shortness of breath and dry cough.  He has occasional hemoptysis.  He lost around 20 pounds in the last 2 months.  He denied having any nausea, vomiting, diarrhea or constipation.  He has no headache or visual changes. Family history significant for mother with kidney cancer father had colon cancer and brother had pancreatic cancer. The patient is married and has 3 children 2 sons and 1 daughter.  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  He was accompanied today by his son Pedro Pope.  The patient has a history of smoking up to 1.5 pack/day for around 25 years and quit in 1994.  He has no history of alcohol or drug abuse.  HPI  Past Medical History:  Diagnosis Date   Anosmia    CAD (coronary artery disease), native coronary artery    a. 10/29/2013 antlat STEMI s/p DES to pLAD; residual 80 % proximal and 60% mid LCx diease   Chronic  headaches    Dressler's syndrome ([name])    a. placed on colchicine    ED (erectile dysfunction)    Elevated TSH    Fatty liver    Gout    Ischemic cardiomyopathy    a. 2D ECHO 11/04/14 w/ EF 40-45% with LV WMA, G2DD, mild MR, mild LA dilation, mod TR.   PAF (paroxysmal atrial fibrillation) ([name])    a. Dx on 11/03/14 admission. Not placed on longterm AC due  to DAPT w/ receent DES. Will plan for outpt heart monitor    Pre-diabetes    a. HgA1c 6.1 in 10/2014   Prostate cancer [facility])    s/p prostatectomy and XRT   Rheumatic fever 1947    Past Surgical History:  Procedure Laterality Date   CORONARY ANGIOPLASTY WITH STENT PLACEMENT  10/30/14   STEMI s/p DES to proximal LAD; residual 80 % proximal and 60% mid LCx diease   LEFT HEART CATHETERIZATION WITH CORONARY ANGIOGRAM N/A 10/30/2014   Procedure: LEFT HEART CATHETERIZATION WITH CORONARY ANGIOGRAM;  Surgeon: Lorretta Harp, MD;  Location: [facility] CATH LAB;  Service: Cardiovascular;  Laterality: N/A;   PERCUTANEOUS CORONARY STENT INTERVENTION (PCI-S)  10/30/2014   Procedure: PERCUTANEOUS CORONARY STENT INTERVENTION (PCI-S);  Surgeon: Lorretta Harp, MD;  Location: [facility] CATH LAB;  Service: Cardiovascular;;   PROSTATECTOMY     THORACENTESIS Left 12/31/2021   Procedure: THORACENTESIS;  Surgeon: Candee Furbish, MD;  Location: [facility] ENDOSCOPY;  Service: Pulmonary;  Laterality: Left;   TONSILLECTOMY     VIDEO ASSISTED THORACOSCOPY (VATS)/DECORTICATION Left 01/21/2022   Procedure: VIDEO ASSISTED THORACOSCOPY (VATS)/DECORTICATION;  Surgeon: Lajuana Matte, MD;  Location: [facility] OR;  Service: Thoracic;  Laterality: Left;    Family History  Problem Relation Age of Onset   Cancer Other    Coronary artery disease Brother 62       s/p CABG   Diabetes Other    Hyperlipidemia Other    Hypertension Other    Gout Other     Social History Social History   Tobacco Use   Smoking status: Former    Types: Cigarettes    Quit date: 08/18/1992    Years since quitting: 29.4   Smokeless tobacco: Never  Substance Use Topics   Alcohol use: Not Currently    Comment: social   Drug use: No    Allergies  Allergen Reactions   Penicillins Anaphylaxis   Corn Syrup [Glucose] Other (See Comments)    High fructose corn syrup causes gout   Aspirin Adult Low [Aspirin] Itching and Rash    Dr. [name] suspects that long term  caused a rash, short term use appears to be ok    Current Outpatient Medications  Medication Sig Dispense Refill   acetaminophen (TYLENOL) 500 MG tablet Take 500 mg by mouth every 6 (six) hours as needed for mild pain or moderate pain.     albuterol (VENTOLIN HFA) 108 (90 Base) MCG/ACT inhaler Inhale 2 puffs into the lungs every 4 (four) hours as needed for shortness of breath.     ascorbic acid (VITAMIN C) 500 MG tablet Take 500 mg by mouth daily.     Blood Pressure Monitor KIT For daily blood pressure monitoring;  Arm Cuff 1 each 0   carvedilol (COREG) 3.125 MG tablet TAKE 1 TABLET BY MOUTH TWICE A DAY WITH MEALS (Patient taking differently: Take 3.125 mg by mouth daily.) 180 tablet 3   Cholecalciferol (VITAMIN D3) 25 MCG (1000 UT) CAPS Take 1 capsule by  mouth daily.     colchicine 0.6 MG tablet Take 1 tablet (0.6 mg total) by mouth daily. (Patient taking differently: Take 0.6 mg by mouth daily as needed (Gout).) 30 tablet 3   nitroGLYCERIN (NITROSTAT) 0.4 MG SL tablet Place 1 tablet (0.4 mg total) under the tongue every 5 (five) minutes as needed for chest pain. 25 tablet 2   oxyCODONE (ROXICODONE) 5 MG immediate release tablet Take 0.5-1 tablets (2.5-5 mg total) by mouth every 4 (four) hours as needed. 30 tablet 0   No current facility-administered medications for this visit.    Review of Systems  Constitutional: positive for anorexia, fatigue, and weight loss Eyes: negative Ears, nose, mouth, throat, and face: negative Respiratory: positive for cough, dyspnea on exertion, hemoptysis, and pleurisy/chest pain Cardiovascular: negative Gastrointestinal: negative Genitourinary:negative Integument/breast: negative Hematologic/lymphatic: negative Musculoskeletal:positive for muscle weakness Neurological: negative Behavioral/Psych: negative Endocrine: negative Allergic/Immunologic: negative  Physical Exam  VPX:TGGYI, healthy, no distress, well nourished, well developed, and  cachectic SKIN: skin color, texture, turgor are normal, no rashes or significant lesions HEAD: Normocephalic, No masses, lesions, tenderness or abnormalities EYES: normal, PERRLA, Conjunctiva are pink and non-injected EARS: External ears normal, Canals clear OROPHARYNX:no exudate, no erythema, and lips, buccal mucosa, and tongue normal  NECK: supple, no adenopathy, no JVD LYMPH:  no palpable lymphadenopathy, no hepatosplenomegaly LUNGS: decreased breath sounds, prolonged expiratory phase HEART: regular rate & rhythm, no murmurs, and no gallops ABDOMEN:abdomen soft, non-tender, normal bowel sounds, and no masses or organomegaly BACK: Back symmetric, no curvature., No CVA tenderness EXTREMITIES:no joint deformities, effusion, or inflammation, no edema  NEURO: alert & oriented x 3 with fluent speech, no focal motor/sensory deficits  PERFORMANCE STATUS: ECOG 1  LABORATORY DATA: Lab Results  Component Value Date   WBC 13.9 (H) 01/30/2022   HGB 12.6 (L) 01/30/2022   HCT 37.7 (L) 01/30/2022   MCV 86.9 01/30/2022   PLT 462 (H) 01/30/2022      Chemistry      Component Value Date/Time   NA 140 01/17/2022 1722   K 4.7 01/17/2022 1722   CL 111 01/17/2022 1722   CO2 22 01/17/2022 1722   BUN 19 01/17/2022 1722   CREATININE 0.82 01/17/2022 1722      Component Value Date/Time   CALCIUM 8.1 (L) 01/17/2022 1722   ALKPHOS 61 01/17/2022 1722   AST 36 01/17/2022 1722   ALT 16 01/17/2022 1722   BILITOT 1.2 01/17/2022 1722       RADIOGRAPHIC STUDIES: DG CHEST PORT 1 VIEW  Result Date: 01/22/2022 CLINICAL DATA:  Ischemic cardiomyopathy.  Postoperative check. EXAM: PORTABLE CHEST 1 VIEW COMPARISON:  January 17, 2022 FINDINGS: The heart size and mediastinal contours are stable. Dense consolidation of the left lung is identified with interval slight better aeration of the left lung base compared prior exam. A left chest tube is identified with the distal tip in the left apex. Subcutaneous emphysema  is identified over the left chest. The right lung demonstrate emphysematous changes. The visualized skeletal structures are stable. IMPRESSION: Dense consolidation of the left lung with interval slight better aeration of the left lung base compared prior exam. Left chest tube is identified with the distal tip in the left apex. Electronically Signed   By: Abelardo Diesel M.D.   On: 01/22/2022 07:44   CT CHEST W CONTRAST  Result Date: 01/20/2022 CLINICAL DATA:  Large left pleural effusion. EXAM: CT CHEST WITH CONTRAST TECHNIQUE: Multidetector CT imaging of the chest was performed during intravenous contrast administration.  RADIATION DOSE REDUCTION: This exam was performed according to the departmental dose-optimization program which includes automated exposure control, adjustment of the mA and/or kV according to patient size and/or use of iterative reconstruction technique. CONTRAST:  190m OMNIPAQUE IOHEXOL 300 MG/ML  SOLN COMPARISON:  Recent chest CTs from May 8 and Jan 15, 2022 FINDINGS: Cardiovascular: The heart is normal in size. No pericardial effusion. The aorta is normal in caliber. Stable atherosclerotic calcifications. The pulmonary arteries are patent. Stable three-vessel coronary artery calcifications. Mediastinum/Nodes: Stable mediastinal lymphadenopathy. 2.5 cm AP window node on image number 63/3. Left hilar nodes versus hilar pleural tumor. The more lateral lesion measures 21 mm and the more medial lesion measures 13 mm. The esophagus is grossly normal. Lungs/Pleura: Very large left pleural effusion increased in size since the prior CT scan. Essentially drowned/obstructed left lung. Extensive enhancing pleural tumor involving the left hemithorax. The heart and mediastinum are displaced to the right because of the large left hydrothorax. The right lung demonstrates advanced emphysematous changes and pulmonary scarring but no pulmonary nodules or right-sided pleural tumor. Upper Abdomen: No significant  upper abdominal findings. Advanced vascular calcifications. Musculoskeletal: No significant bony findings. IMPRESSION: 1. Very large left pleural effusion increased in size since the prior CT scan. 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 2. Essentially drowned/obstructed left lung. 3. Extensive enhancing left pleural tumor and mediastinal adenopathy. 4. Advanced emphysematous changes and pulmonary scarring in the right lung but no pulmonary nodules or right-sided pleural tumor. 5. Stable advanced vascular calcifications. Aortic Atherosclerosis (ICD10-I70.0) and Emphysema (ICD10-J43.9). Electronically Signed   By: PMarijo SanesM.D.   On: 01/20/2022 16:43   CT ABDOMEN PELVIS W CONTRAST  Result Date: 01/17/2022 CLINICAL DATA:  Pain left lower quadrant of abdomen, rectal bleeding EXAM: CT ABDOMEN AND PELVIS WITH CONTRAST TECHNIQUE: Multidetector CT imaging of the abdomen and pelvis was performed using the standard protocol following bolus administration of intravenous contrast. RADIATION DOSE REDUCTION: This exam was performed according to the departmental dose-optimization program which includes automated exposure control, adjustment of the mA and/or kV according to patient size and/or use of iterative reconstruction technique. CONTRAST:  841mOMNIPAQUE IOHEXOL 300 MG/ML  SOLN COMPARISON:  Previous CT chest done on 01/15/2022, chest radiographs done earlier today FINDINGS: Lower chest: Large left pleural effusion is noted. There are peripheral pleural nodules on the left side. There is almost complete atelectasis in the visualized left lower lung fields. There is increase in interstitial markings in the periphery of right lower lung fields possibly suggesting interstitial lung disease. There are scattered coronary artery calcifications. Dense calcification is seen in the region of mitral annulus. Hepatobiliary: In the image 14 of series 4, there is 1.3  cm low-density lesion in the right lobe of liver. There is no significant dilation of bile ducts. Gallbladder is not distended. Pancreas: No focal abnormality is seen. Spleen: There is medial displacement of spleen caused by large left pleural effusion. Adrenals/Urinary Tract: Adrenals are unremarkable. There is no hydronephrosis. Left kidney is displaced medially. There is 7 mm calculus in the midportion of right kidney. Ureters are not dilated. Urinary bladder is unremarkable. Stomach/Bowel: Stomach is not distended. Small bowel loops are not dilated. Appendix is not dilated. Appendix is noted in the right pelvic cavity. There is no significant wall thickening in colon. There is no pericolic stranding. Vascular/Lymphatic: Extensive calcifications are seen in the abdominal aorta and its major branches. Some of the coarse calcifications  are noted projecting into the lumen of abdominal aorta. Reproductive: There is evidence of previous surgical removal of prostate. There are multiple surgical clips in both sides of pelvis. Other: There is no ascites or pneumoperitoneum. Umbilical hernia containing fat is seen. Musculoskeletal: Degenerative changes are noted in both hips, more so on the right side. No definite focal lytic or sclerotic lesions are seen. IMPRESSION: Massive left pleural effusion. There are numerous nodules in the periphery of left pleural space suggesting malignant neoplastic process such as mesothelioma or pleural metastatic disease. There is compression atelectasis of visualized left lower lung fields. There is prominence of interstitial markings in the periphery of right lower lung fields suggesting scarring from interstitial lung disease. There is no evidence of intestinal obstruction or pneumoperitoneum. Appendix is not dilated. There is no hydronephrosis. There is 7 mm nonobstructing right renal stone. There is 13 mm low-density lesion in the right lobe of liver which is not fully evaluated. This  may suggest hemangioma or malignant neoplastic process such as metastatic disease. Other findings as described in the body of the report. Electronically Signed   By: Elmer Picker M.D.   On: 01/17/2022 20:06   DG Chest 2 View  Result Date: 01/17/2022 CLINICAL DATA:  Worsening hypoxia. Known left pleural effusion recurrence EXAM: CHEST - 2 VIEW COMPARISON:  Chest CT 01/15/2022.  Radiograph 12/31/2021 FINDINGS: Large partially loculated left pleural effusion is not significantly changed from recent chest CT. Lateral component tracks towards the apex. There is underlying left lung opacities. Right lung emphysema without acute findings. Heart size is obscured by lung opacity. No pneumothorax. IMPRESSION: 1. Large partially loculated left pleural effusion, not significantly changed from CT 2 days ago. Underlying left lung opacities were better assessed on recent CT. 2. Emphysema.  No acute right lung findings. Electronically Signed   By: Keith Rake M.D.   On: 01/17/2022 17:08   CT Chest Wo Contrast  Result Date: 01/17/2022 CLINICAL DATA:  Follow-up pneumonia, parapneumonic effusion * Tracking Code: BO * EXAM: CT CHEST WITHOUT CONTRAST TECHNIQUE: Multidetector CT imaging of the chest was performed following the standard protocol without IV contrast. RADIATION DOSE REDUCTION: This exam was performed according to the departmental dose-optimization program which includes automated exposure control, adjustment of the mA and/or kV according to patient size and/or use of iterative reconstruction technique. COMPARISON:  12/23/2021 FINDINGS: Cardiovascular: Aortic atherosclerosis. Aortic valve calcifications. Normal heart size. Subendocardial fibrofatty scarring of the left ventricular apex in keeping with prior infarction (series 2, image 117). Three-vessel coronary artery calcifications. No pericardial effusion. Mediastinum/Nodes: Interval enlargement of pretracheal, AP window, and left hilar lymph nodes,  largest AP window node measuring 2.0 x 1.9 cm, previously 1.7 x 1.5 cm (series 2, image 66). Thyroid gland, trachea, and esophagus demonstrate no significant findings. Lungs/Pleura: Interval increase in volume of a left pleural effusion, now large, with extensive associated pleural thickening and nodularity throughout the left hemithorax (series 2, image 138). There is now essentially complete consolidation and or atelectasis of the left upper lobe sans lingula, with proximal obstruction of the left upper lobe bronchus (series 5, image 77). Moderate to severe underlying centrilobular and paraseptal emphysema. Mild pulmonary fibrosis of the aerated right lung, characterized by irregular peripheral interstitial opacity, septal thickening, and areas of subpleural bronchiolectasis at the right lung base. Upper Abdomen: No acute abnormality. Nonobstructive right nephrolithiasis. Musculoskeletal: No chest wall abnormality. No suspicious osseous lesions identified. IMPRESSION: 1. Interval increase in volume of a left pleural effusion, now large, with  extensive associated pleural thickening and nodularity throughout the left hemithorax. There is now essentially complete consolidation and or atelectasis of the left upper lobe sans lingula, with proximal obstruction of the left upper lobe bronchus. Findings are most consistent with underlying primary lung malignancy and associated pleural metastatic disease. 2. Interval enlargement of pretracheal, AP window, and left hilar lymph nodes, which may be reactive but are highly suspicious for nodal metastatic disease. 3. Underlying emphysema. 4. Mild pulmonary fibrosis of the aerated right lung, characterized by irregular peripheral interstitial opacity, septal thickening, and areas of subpleural bronchiolectasis at the right lung base. Although not optimally characterized by this non tailored examination, findings are categorized as probable UIP per consensus guidelines: Diagnosis  of Idiopathic Pulmonary Fibrosis: An Official ATS/ERS/JRS/ALAT Clinical Practice Guideline. [place], Iss 5, [phone], Apr 18 2017. 5. Coronary artery disease. 6. Aortic valve calcifications. Correlate for echocardiographic evidence of aortic valve dysfunction. 7. Nonobstructive right nephrolithiasis. These results will be called to the ordering clinician or representative by the Radiologist Assistant, and communication documented in the PACS or [employer]. Aortic Atherosclerosis (ICD10-I70.0) and Emphysema (ICD10-J43.9). Electronically Signed   By: Delanna Ahmadi M.D.   On: 01/17/2022 09:00   DG CHEST PORT 1 VIEW  Result Date: 12/31/2021 CLINICAL DATA:  Pneumonia, s/P thoracentesis EXAM: PORTABLE CHEST - 1 VIEW COMPARISON:  12/08/2021 FINDINGS: No pneumothorax. Progressive peripheral consolidation in the left mid lung extending to the hilum. Blunting of the left lateral costophrenic angle suggesting small effusion. Right lung clear. Heart size and mediastinal contours are within normal limits. Aortic Atherosclerosis (ICD10-170.0). Visualized bones unremarkable. IMPRESSION: 1. No pneumothorax. 2. Worsening peripheral left mid lung airspace consolidation with small effusion Electronically Signed   By: Lucrezia Europe M.D.   On: 12/31/2021 15:08    ASSESSMENT: This is a very pleasant 81 years old white male recently diagnosed with extensive stage (T3, N2, M1 a) small cell lung cancer presented with obstructive left upper lobe lung mass in addition to mediastinal lymphadenopathy and malignant left pleural effusion as well as pleural-based metastasis diagnosed in June 2023.   PLAN: I had a lengthy discussion with the patient and his son today about his current disease stage, prognosis and treatment options. I recommended for the patient to complete the staging work-up by ordering a PET scan as well as MRI of the brain to rule out any other metastatic disease. I explained to the  patient and his son that he has incurable condition and all the treatment will be of palliative nature. I explained to the patient that the prognosis without treatment range between 6-week - 3 months and with treatment with chemotherapy/immunotherapy the average survival is around 13 months. I gave the patient the option of palliative care and hospice referral versus consideration of treatment with palliative systemic chemotherapy with carboplatin initiated for AUC of 4 on day 1 and 2 etoposide 80 Mg/M2 on days 1, 2 and 3 with Cosela before the chemotherapy and Imfinzi 1500 Mg IV on day 1 every 3 weeks.  If the patient has improvement in his condition, I may consider increasing his dose of carboplatin to the standard dose of AUC of 5 and etoposide to 100 Mg/M2. The patient is interested in proceeding with the treatment.  I discussed with him the adverse effect of this treatment including but not limited to alopecia, myelosuppression, nausea and vomiting, peripheral neuropathy, liver or renal dysfunction as well as the immunotherapy adverse effects. He is expected  to start the first cycle of this treatment next week. I will see the patient back for follow-up visit in 2 weeks for evaluation and management of any adverse effect of his treatment. We will arrange for the patient to have a chemotherapy education class before the first dose of his treatment. I will call his pharmacy with prescription for Compazine 10 mg p.o. every 6 hours as needed for nausea. The patient was advised to call immediately if he has any other concerning symptoms in the interval. The patient voices understanding of current disease status and treatment options and is in agreement with the current care plan.  All questions were answered. The patient knows to call the clinic with any problems, questions or concerns. We can certainly see the patient much sooner if necessary.  Thank you so much for allowing me to participate in the  care of [employer].. I will continue to follow up the patient with you and assist in his care. The total time spent in the appointment was 90 minutes.  Disclaimer: This note was dictated with voice recognition software. Similar sounding words can inadvertently be transcribed and may not be corrected upon review.   Eilleen Kempf January 30, 2022, 2:21 PM

## 2022-01-30 NOTE — Progress Notes (Signed)
START ON PATHWAY REGIMEN - Small Cell Lung     Cycles 1 through 4: A cycle is every 21 days:     Durvalumab      Carboplatin      Etoposide    Cycles 5 and beyond: A cycle is every 28 days:     Durvalumab   **Always confirm dose/schedule in your pharmacy ordering system**  Patient Characteristics: Newly Diagnosed, Preoperative or Nonsurgical Candidate (Clinical Staging), First Line, Extensive Stage Therapeutic Status: Newly Diagnosed, Preoperative or Nonsurgical Candidate (Clinical Staging) AJCC T Category: cT3 AJCC N Category: cN2 AJCC M Category: pM1a AJCC 8 Stage Grouping: IVA Stage Classification: Extensive  Intent of Therapy: Non-Curative / Palliative Intent, Discussed with Patient

## 2022-01-31 ENCOUNTER — Ambulatory Visit (INDEPENDENT_AMBULATORY_CARE_PROVIDER_SITE_OTHER): Payer: Medicare Other | Admitting: Pulmonary Disease

## 2022-01-31 ENCOUNTER — Encounter: Payer: Self-pay | Admitting: Pulmonary Disease

## 2022-01-31 VITALS — BP 116/64 | HR 80 | Temp 98.2°F | Ht 69.0 in | Wt 137.0 lb

## 2022-01-31 DIAGNOSIS — J90 Pleural effusion, not elsewhere classified: Secondary | ICD-10-CM

## 2022-01-31 DIAGNOSIS — C3482 Malignant neoplasm of overlapping sites of left bronchus and lung: Secondary | ICD-10-CM | POA: Diagnosis not present

## 2022-01-31 DIAGNOSIS — C3490 Malignant neoplasm of unspecified part of unspecified bronchus or lung: Secondary | ICD-10-CM | POA: Diagnosis not present

## 2022-01-31 DIAGNOSIS — Z4801 Encounter for change or removal of surgical wound dressing: Secondary | ICD-10-CM | POA: Diagnosis not present

## 2022-01-31 DIAGNOSIS — J9601 Acute respiratory failure with hypoxia: Secondary | ICD-10-CM | POA: Diagnosis not present

## 2022-01-31 DIAGNOSIS — J942 Hemothorax: Secondary | ICD-10-CM | POA: Diagnosis not present

## 2022-01-31 DIAGNOSIS — J910 Malignant pleural effusion: Secondary | ICD-10-CM | POA: Diagnosis not present

## 2022-01-31 DIAGNOSIS — Z48813 Encounter for surgical aftercare following surgery on the respiratory system: Secondary | ICD-10-CM | POA: Diagnosis not present

## 2022-01-31 MED ORDER — IPRATROPIUM-ALBUTEROL 0.5-2.5 (3) MG/3ML IN SOLN: 3.0000 mL | Freq: Four times a day (QID) | RESPIRATORY_TRACT | 2 refills | Status: DC | PRN

## 2022-01-31 NOTE — Patient Instructions (Addendum)
Nice to see you again  Continue with the PleurX drainage and care as you are and have been instructed  I sent a prescription for Duonebs - use every 6 hour as needed for wheeze, shortness of breath, cough.  We will send an order for a nebulizer machine.  Return to clinic in 3 months or sooner as needed with Dr. Silas Flood

## 2022-01-31 NOTE — Progress Notes (Signed)
@Patient  ID: Pedro Pope., male    DOB: 1940/10/27, 81 y.o.   MRN: [id number]  Chief Complaint  Patient presents with   Hospitalization Follow-up    Hospital follow up. Discharged on 6/8. Was seen for hypoxia. Pt is currently on 4L of O2 now. Pt states he is feeling better since leaving the hospital.     Referring provider: Shon Baton, MD  HPI:   81 y.o. man whom we are seeing in hospital follow-up with enlarging left upper lobe mass and recurrent pleural effusion found to have small cell carcinoma with malignant pleural effusion.  Discharge summary reviewed.  Recent pulmonary office note reviewed.  Presents for follow-up.  Draining Pleurx daily.  Getting between 500-600 cc.  Color is now more yellow compared to bloodier during initial drainages.  No signs or symptoms of infection at insertion site per home health nurse today.  He denies significant dyspnea.  In the past had used inhalers but unclear which ones have been helpful or not.  Has albuterol but does not really use.  We discussed recent results of biopsy.  He understands plan with oncology.  Has upcoming plan for chemotherapy next week.  Has follow-up with Dr. Kipp Brood, thoracic surgery next week.  HPI initial visit: He was in usual health.  Significant smoking history.  Quit in the mid 11s.  No issues with dyspnea, cough etc.  Still works.  Unfortunately, developed chest pain and some cough, fever.  Went to the ED 11/2022.  Diagnosed with pneumonia based on chest x-ray on my review interpretation shows left peripheral midlung field opacity, scattered nodular surrounding, clear on the right, cannot totally assess presence of pleural effusion as left lung base is cut off, hyperinflation is present.  Placed on antibiotics.  Mild improvement in symptoms.  Unfortunate, developed recurrent pleuritic chest pain.  Had moved slightly, involve more the chest as opposed to upper chest initially.  This prompted CT scan 12/23/2021 on my review  interpretation shows dense confluent airspace disease with peripheral nodularity and associated moderate left pleural effusion that appears relatively free-flowing with subtle early signs of loculation.  He was placed on levofloxacin at that point time.  He referred here for further evaluation.  Overall, cough better.  Producing some sputum.  Still with pleuritic chest pain.  No fevers.  Overall improving but still residual symptoms.  PMH: Sinus problem, CAD status post stent LAD 10/2014, atrial fibrillation, emphysema, prostate cancer, seasonal allergies Surgical history: Tonsillectomy, stent as above Family history: CAD in first-degree relatives Social history: Former smoker, quit in 1994, lives in [place] / Pulmonary Flowsheets:   ACT:      No data to display           MMRC:     No data to display           Epworth:      No data to display           Tests:   FENO:  No results found for: "NITRICOXIDE"  PFT:     No data to display           WALK:      No data to display           Imaging: Personally reviewed and as per EMR DG CHEST PORT 1 VIEW  Result Date: 01/22/2022 CLINICAL DATA:  Ischemic cardiomyopathy.  Postoperative check. EXAM: PORTABLE CHEST 1 VIEW COMPARISON:  January 17, 2022 FINDINGS: The heart size and  mediastinal contours are stable. Dense consolidation of the left lung is identified with interval slight better aeration of the left lung base compared prior exam. A left chest tube is identified with the distal tip in the left apex. Subcutaneous emphysema is identified over the left chest. The right lung demonstrate emphysematous changes. The visualized skeletal structures are stable. IMPRESSION: Dense consolidation of the left lung with interval slight better aeration of the left lung base compared prior exam. Left chest tube is identified with the distal tip in the left apex. Electronically Signed   By: Abelardo Diesel M.D.    On: 01/22/2022 07:44   CT CHEST W CONTRAST  Result Date: 01/20/2022 CLINICAL DATA:  Large left pleural effusion. EXAM: CT CHEST WITH CONTRAST TECHNIQUE: Multidetector CT imaging of the chest was performed during intravenous contrast administration. RADIATION DOSE REDUCTION: This exam was performed according to the departmental dose-optimization program which includes automated exposure control, adjustment of the mA and/or kV according to patient size and/or use of iterative reconstruction technique. CONTRAST:  145m OMNIPAQUE IOHEXOL 300 MG/ML  SOLN COMPARISON:  Recent chest CTs from May 8 and Jan 15, 2022 FINDINGS: Cardiovascular: The heart is normal in size. No pericardial effusion. The aorta is normal in caliber. Stable atherosclerotic calcifications. The pulmonary arteries are patent. Stable three-vessel coronary artery calcifications. Mediastinum/Nodes: Stable mediastinal lymphadenopathy. 2.5 cm AP window node on image number 63/3. Left hilar nodes versus hilar pleural tumor. The more lateral lesion measures 21 mm and the more medial lesion measures 13 mm. The esophagus is grossly normal. Lungs/Pleura: Very large left pleural effusion increased in size since the prior CT scan. Essentially drowned/obstructed left lung. Extensive enhancing pleural tumor involving the left hemithorax. The heart and mediastinum are displaced to the right because of the large left hydrothorax. The right lung demonstrates advanced emphysematous changes and pulmonary scarring but no pulmonary nodules or right-sided pleural tumor. Upper Abdomen: No significant upper abdominal findings. Advanced vascular calcifications. Musculoskeletal: No significant bony findings. IMPRESSION: 1. Very large left pleural effusion increased in size since the prior CT scan. 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 2. Essentially drowned/obstructed left lung. 3. Extensive enhancing left  pleural tumor and mediastinal adenopathy. 4. Advanced emphysematous changes and pulmonary scarring in the right lung but no pulmonary nodules or right-sided pleural tumor. 5. Stable advanced vascular calcifications. Aortic Atherosclerosis (ICD10-I70.0) and Emphysema (ICD10-J43.9). Electronically Signed   By: PMarijo SanesM.D.   On: 01/20/2022 16:43   CT ABDOMEN PELVIS W CONTRAST  Result Date: 01/17/2022 CLINICAL DATA:  Pain left lower quadrant of abdomen, rectal bleeding EXAM: CT ABDOMEN AND PELVIS WITH CONTRAST TECHNIQUE: Multidetector CT imaging of the abdomen and pelvis was performed using the standard protocol following bolus administration of intravenous contrast. RADIATION DOSE REDUCTION: This exam was performed according to the departmental dose-optimization program which includes automated exposure control, adjustment of the mA and/or kV according to patient size and/or use of iterative reconstruction technique. CONTRAST:  84mOMNIPAQUE IOHEXOL 300 MG/ML  SOLN COMPARISON:  Previous CT chest done on 01/15/2022, chest radiographs done earlier today FINDINGS: Lower chest: Large left pleural effusion is noted. There are peripheral pleural nodules on the left side. There is almost complete atelectasis in the visualized left lower lung fields. There is increase in interstitial markings in the periphery of right lower lung fields possibly suggesting interstitial lung disease. There are scattered coronary artery calcifications. Dense calcification is seen in the region  of mitral annulus. Hepatobiliary: In the image 14 of series 4, there is 1.3 cm low-density lesion in the right lobe of liver. There is no significant dilation of bile ducts. Gallbladder is not distended. Pancreas: No focal abnormality is seen. Spleen: There is medial displacement of spleen caused by large left pleural effusion. Adrenals/Urinary Tract: Adrenals are unremarkable. There is no hydronephrosis. Left kidney is displaced medially. There  is 7 mm calculus in the midportion of right kidney. Ureters are not dilated. Urinary bladder is unremarkable. Stomach/Bowel: Stomach is not distended. Small bowel loops are not dilated. Appendix is not dilated. Appendix is noted in the right pelvic cavity. There is no significant wall thickening in colon. There is no pericolic stranding. Vascular/Lymphatic: Extensive calcifications are seen in the abdominal aorta and its major branches. Some of the coarse calcifications are noted projecting into the lumen of abdominal aorta. Reproductive: There is evidence of previous surgical removal of prostate. There are multiple surgical clips in both sides of pelvis. Other: There is no ascites or pneumoperitoneum. Umbilical hernia containing fat is seen. Musculoskeletal: Degenerative changes are noted in both hips, more so on the right side. No definite focal lytic or sclerotic lesions are seen. IMPRESSION: Massive left pleural effusion. There are numerous nodules in the periphery of left pleural space suggesting malignant neoplastic process such as mesothelioma or pleural metastatic disease. There is compression atelectasis of visualized left lower lung fields. There is prominence of interstitial markings in the periphery of right lower lung fields suggesting scarring from interstitial lung disease. There is no evidence of intestinal obstruction or pneumoperitoneum. Appendix is not dilated. There is no hydronephrosis. There is 7 mm nonobstructing right renal stone. There is 13 mm low-density lesion in the right lobe of liver which is not fully evaluated. This may suggest hemangioma or malignant neoplastic process such as metastatic disease. Other findings as described in the body of the report. Electronically Signed   By: Elmer Picker M.D.   On: 01/17/2022 20:06   DG Chest 2 View  Result Date: 01/17/2022 CLINICAL DATA:  Worsening hypoxia. Known left pleural effusion recurrence EXAM: CHEST - 2 VIEW COMPARISON:  Chest  CT 01/15/2022.  Radiograph 12/31/2021 FINDINGS: Large partially loculated left pleural effusion is not significantly changed from recent chest CT. Lateral component tracks towards the apex. There is underlying left lung opacities. Right lung emphysema without acute findings. Heart size is obscured by lung opacity. No pneumothorax. IMPRESSION: 1. Large partially loculated left pleural effusion, not significantly changed from CT 2 days ago. Underlying left lung opacities were better assessed on recent CT. 2. Emphysema.  No acute right lung findings. Electronically Signed   By: Keith Rake M.D.   On: 01/17/2022 17:08   CT Chest Wo Contrast  Result Date: 01/17/2022 CLINICAL DATA:  Follow-up pneumonia, parapneumonic effusion * Tracking Code: BO * EXAM: CT CHEST WITHOUT CONTRAST TECHNIQUE: Multidetector CT imaging of the chest was performed following the standard protocol without IV contrast. RADIATION DOSE REDUCTION: This exam was performed according to the departmental dose-optimization program which includes automated exposure control, adjustment of the mA and/or kV according to patient size and/or use of iterative reconstruction technique. COMPARISON:  12/23/2021 FINDINGS: Cardiovascular: Aortic atherosclerosis. Aortic valve calcifications. Normal heart size. Subendocardial fibrofatty scarring of the left ventricular apex in keeping with prior infarction (series 2, image 117). Three-vessel coronary artery calcifications. No pericardial effusion. Mediastinum/Nodes: Interval enlargement of pretracheal, AP window, and left hilar lymph nodes, largest AP window node measuring 2.0 x 1.9  cm, previously 1.7 x 1.5 cm (series 2, image 66). Thyroid gland, trachea, and esophagus demonstrate no significant findings. Lungs/Pleura: Interval increase in volume of a left pleural effusion, now large, with extensive associated pleural thickening and nodularity throughout the left hemithorax (series 2, image 138). There is now  essentially complete consolidation and or atelectasis of the left upper lobe sans lingula, with proximal obstruction of the left upper lobe bronchus (series 5, image 77). Moderate to severe underlying centrilobular and paraseptal emphysema. Mild pulmonary fibrosis of the aerated right lung, characterized by irregular peripheral interstitial opacity, septal thickening, and areas of subpleural bronchiolectasis at the right lung base. Upper Abdomen: No acute abnormality. Nonobstructive right nephrolithiasis. Musculoskeletal: No chest wall abnormality. No suspicious osseous lesions identified. IMPRESSION: 1. Interval increase in volume of a left pleural effusion, now large, with extensive associated pleural thickening and nodularity throughout the left hemithorax. There is now essentially complete consolidation and or atelectasis of the left upper lobe sans lingula, with proximal obstruction of the left upper lobe bronchus. Findings are most consistent with underlying primary lung malignancy and associated pleural metastatic disease. 2. Interval enlargement of pretracheal, AP window, and left hilar lymph nodes, which may be reactive but are highly suspicious for nodal metastatic disease. 3. Underlying emphysema. 4. Mild pulmonary fibrosis of the aerated right lung, characterized by irregular peripheral interstitial opacity, septal thickening, and areas of subpleural bronchiolectasis at the right lung base. Although not optimally characterized by this non tailored examination, findings are categorized as probable UIP per consensus guidelines: Diagnosis of Idiopathic Pulmonary Fibrosis: An Official ATS/ERS/JRS/ALAT Clinical Practice Guideline. [place], Iss 5, [phone], Apr 18 2017. 5. Coronary artery disease. 6. Aortic valve calcifications. Correlate for echocardiographic evidence of aortic valve dysfunction. 7. Nonobstructive right nephrolithiasis. These results will be called to the ordering  clinician or representative by the Radiologist Assistant, and communication documented in the PACS or [employer]. Aortic Atherosclerosis (ICD10-I70.0) and Emphysema (ICD10-J43.9). Electronically Signed   By: Delanna Ahmadi M.D.   On: 01/17/2022 09:00    Lab Results: Personally reviewed CBC    Component Value Date/Time   WBC 13.9 (H) 01/30/2022 1409   WBC 10.8 (H) 01/18/2022 0351   RBC 4.34 01/30/2022 1409   HGB 12.6 (L) 01/30/2022 1409   HCT 37.7 (L) 01/30/2022 1409   PLT 462 (H) 01/30/2022 1409   MCV 86.9 01/30/2022 1409   MCH 29.0 01/30/2022 1409   MCHC 33.4 01/30/2022 1409   RDW 13.3 01/30/2022 1409   LYMPHSABS 1.9 01/30/2022 1409   MONOABS 1.3 (H) 01/30/2022 1409   EOSABS 0.1 01/30/2022 1409   BASOSABS 0.0 01/30/2022 1409    BMET    Component Value Date/Time   NA 135 01/30/2022 1409   K 4.7 01/30/2022 1409   CL 101 01/30/2022 1409   CO2 28 01/30/2022 1409   GLUCOSE 103 (H) 01/30/2022 1409   BUN 19 01/30/2022 1409   CREATININE 0.89 01/30/2022 1409   CALCIUM 8.8 (L) 01/30/2022 1409   GFRNONAA >60 01/30/2022 1409   GFRAA 75 (L) 11/04/2014 0415    BNP    Component Value Date/Time   BNP 192.2 (H) 01/17/2022 2035    ProBNP No results found for: "PROBNP"  Specialty Problems       Pulmonary Problems   Acute respiratory failure (HCC)   Acute respiratory failure with hypoxia (HCC)   Pleural effusion on left   Hemothorax   Small cell lung cancer, overlapping sites of left  lung (HCC)     Allergies  Allergen Reactions   Penicillins Anaphylaxis   Corn Syrup [Glucose] Other (See Comments)    High fructose corn syrup causes gout   Aspirin Adult Low [Aspirin] Itching and Rash    Dr. [name] suspects that long term caused a rash, short term use appears to be ok    Immunization History  Administered Date(s) Administered   Influenza Split 05/20/2012, 09/01/2013   Influenza, High Dose Seasonal PF 04/23/2017   Influenza, Quadrivalent, Recombinant, Inj, Pf  04/08/2018, 07/18/2020   Influenza,inj,Quad PF,6+ Mos 11/01/2014   PFIZER Comirnaty(Gray Top)Covid-19 Tri-Sucrose Vaccine 12/16/2019, 05/18/2020   PFIZER(Purple Top)SARS-COV-2 Vaccination 10/14/2019, 11/08/2019   Pneumococcal Conjugate-13 09/01/2013, 10/01/2017   Pneumococcal Polysaccharide-23 11/01/2014   Tdap 08/18/2008   Zoster, Live 09/01/2013    Past Medical History:  Diagnosis Date   Anosmia    CAD (coronary artery disease), native coronary artery    a. 10/29/2013 antlat STEMI s/p DES to pLAD; residual 80 % proximal and 60% mid LCx diease   Chronic headaches    Dressler's syndrome (HCC)    a. placed on colchicine    ED (erectile dysfunction)    Elevated TSH    Fatty liver    Gout    Ischemic cardiomyopathy    a. 2D ECHO 11/04/14 w/ EF 40-45% with LV WMA, G2DD, mild MR, mild LA dilation, mod TR.   PAF (paroxysmal atrial fibrillation) ([facility])    a. Dx on 11/03/14 admission. Not placed on longterm AC due to DAPT w/ receent DES. Will plan for outpt heart monitor    Pre-diabetes    a. HgA1c 6.1 in 10/2014   Prostate cancer [facility])    s/p prostatectomy and XRT   Rheumatic fever 1947    Tobacco History: Social History   Tobacco Use  Smoking Status Former   Types: Cigarettes   Quit date: 08/18/1992   Years since quitting: 29.4  Smokeless Tobacco Never   Counseling given: Not Answered   Continue to not smoke  Outpatient Encounter Medications as of 01/31/2022  Medication Sig   acetaminophen (TYLENOL) 500 MG tablet Take 500 mg by mouth every 6 (six) hours as needed for mild pain or moderate pain.   albuterol (VENTOLIN HFA) 108 (90 Base) MCG/ACT inhaler Inhale 2 puffs into the lungs every 4 (four) hours as needed for shortness of breath.   ascorbic acid (VITAMIN C) 500 MG tablet Take 500 mg by mouth daily.   Blood Pressure Monitor KIT For daily blood pressure monitoring;  Arm Cuff   carvedilol (COREG) 3.125 MG tablet TAKE 1 TABLET BY MOUTH TWICE A DAY WITH MEALS (Patient taking  differently: Take 3.125 mg by mouth daily. Take 1 tablet by mouth daily)   Cholecalciferol (VITAMIN D3) 25 MCG (1000 UT) CAPS Take 1 capsule by mouth daily.   colchicine 0.6 MG tablet Take 1 tablet (0.6 mg total) by mouth daily. (Patient taking differently: Take 0.6 mg by mouth daily as needed (Gout).)   gabapentin (NEURONTIN) 100 MG capsule Take by mouth 2 (two) times daily.   ipratropium-albuterol (DUONEB) 0.5-2.5 (3) MG/3ML SOLN Take 3 mLs by nebulization every 6 (six) hours as needed (whheze, shortness of breath, cough).   nitroGLYCERIN (NITROSTAT) 0.4 MG SL tablet Place 1 tablet (0.4 mg total) under the tongue every 5 (five) minutes as needed for chest pain.   oxyCODONE (ROXICODONE) 5 MG immediate release tablet Take 0.5-1 tablets (2.5-5 mg total) by mouth every 4 (four) hours as needed.  prochlorperazine (COMPAZINE) 10 MG tablet Take 1 tablet (10 mg total) by mouth every 6 (six) hours as needed for nausea or vomiting.   No facility-administered encounter medications on file as of 01/31/2022.     Review of Systems  Review of Systems  N/a Physical Exam  BP 116/64 (BP Location: Left Arm, Patient Position: Sitting, Cuff Size: Normal)   Pulse 80   Temp 98.2 F (36.8 C) (Oral)   Ht 5' 9"  (1.753 m)   Wt 137 lb (62.1 kg)   SpO2 98% Comment: 4L  BMI 20.23 kg/m   Wt Readings from Last 5 Encounters:  01/31/22 137 lb (62.1 kg)  01/30/22 130 lb 1 oz (59 kg)  01/20/22 135 lb 14.4 oz (61.6 kg)  12/31/21 139 lb (63 kg)  12/25/21 140 lb 9.6 oz (63.8 kg)    BMI Readings from Last 5 Encounters:  01/31/22 20.23 kg/m  01/30/22 19.78 kg/m  01/20/22 20.66 kg/m  12/31/21 20.53 kg/m  12/25/21 20.76 kg/m     Physical Exam General: Well-appearing, no acute distress Eyes: EOMI, icterus Neck: Supple, no JVP Pulmonary: Diminished left base, normal work of breathing Cardiovascular: Regular rate and rhythm, no murmur Abdomen: Nondistended, bowel sounds present MSK: No synovitis, joint  effusion Neuro: Normal gait, no weakness Psych: Normal mood, full affect  Assessment & Plan:    Malignant effusion: Initially present on CT scan following up images concerning for left upper lobe pneumonia.  This was drained via thoracentesis, cytology negative for malignant cells.  Post drainage chest x-ray with worsening left upper lobe infiltrate concerning for malignancy given lack of improvement on imaging.  Serial CT scans in the interim revealed enlarged left upper lobe mass with pleural studding, enlarged mediastinal and hilar lymphadenopathy.  Subsequent VATS with pleural biopsy was consistent with small cell carcinoma, cytology from effusion at time of VATS consistent with small cell carcinoma.  Pleurx is in place.  Drain daily.  Getting around 500 cc a day.  To continue daily drainage.  Has clear instructions on how to decrease frequency drainage when volume decreases.  Has upcoming follow-up with thoracic surgery.  Happy to coordinate care of Pleurx in the future.  Small cell lung cancer: Recent seen by Dr. Julien Nordmann.  To begin chemotherapy and immunotherapy next week.   Return in about 3 months (around 05/03/2022).   Lanier Clam, MD 01/31/2022  I spent 45 minutes in patient care including review of records, face to face visit, coordination of care.

## 2022-01-31 NOTE — Addendum Note (Signed)
Addended by: Retia Passe on: 01/31/2022 04:52 PM   Modules accepted: Orders

## 2022-02-03 ENCOUNTER — Inpatient Hospital Stay: Payer: Medicare Other

## 2022-02-03 ENCOUNTER — Telehealth: Payer: Self-pay | Admitting: Internal Medicine

## 2022-02-03 DIAGNOSIS — Z48813 Encounter for surgical aftercare following surgery on the respiratory system: Secondary | ICD-10-CM | POA: Diagnosis not present

## 2022-02-03 DIAGNOSIS — C3490 Malignant neoplasm of unspecified part of unspecified bronchus or lung: Secondary | ICD-10-CM | POA: Diagnosis not present

## 2022-02-03 DIAGNOSIS — J942 Hemothorax: Secondary | ICD-10-CM | POA: Diagnosis not present

## 2022-02-03 DIAGNOSIS — Z4801 Encounter for change or removal of surgical wound dressing: Secondary | ICD-10-CM | POA: Diagnosis not present

## 2022-02-03 DIAGNOSIS — J910 Malignant pleural effusion: Secondary | ICD-10-CM | POA: Diagnosis not present

## 2022-02-03 DIAGNOSIS — J9601 Acute respiratory failure with hypoxia: Secondary | ICD-10-CM | POA: Diagnosis not present

## 2022-02-03 MED FILL — Dexamethasone Sodium Phosphate Inj 100 MG/10ML: INTRAMUSCULAR | Qty: 1 | Status: AC

## 2022-02-03 MED FILL — Fosaprepitant Dimeglumine For IV Infusion 150 MG (Base Eq): INTRAVENOUS | Qty: 5 | Status: AC

## 2022-02-03 NOTE — Telephone Encounter (Signed)
Scheduled per 06/15 los, patient has been called and notified.

## 2022-02-04 ENCOUNTER — Inpatient Hospital Stay: Payer: Medicare Other

## 2022-02-04 ENCOUNTER — Other Ambulatory Visit: Payer: Self-pay

## 2022-02-04 VITALS — BP 87/68 | HR 69 | Temp 97.8°F | Resp 20 | Wt 128.5 lb

## 2022-02-04 DIAGNOSIS — C3482 Malignant neoplasm of overlapping sites of left bronchus and lung: Secondary | ICD-10-CM

## 2022-02-04 DIAGNOSIS — J910 Malignant pleural effusion: Secondary | ICD-10-CM | POA: Diagnosis not present

## 2022-02-04 DIAGNOSIS — K5900 Constipation, unspecified: Secondary | ICD-10-CM | POA: Diagnosis not present

## 2022-02-04 DIAGNOSIS — M7989 Other specified soft tissue disorders: Secondary | ICD-10-CM | POA: Diagnosis not present

## 2022-02-04 DIAGNOSIS — Z5111 Encounter for antineoplastic chemotherapy: Secondary | ICD-10-CM | POA: Diagnosis not present

## 2022-02-04 DIAGNOSIS — Z5112 Encounter for antineoplastic immunotherapy: Secondary | ICD-10-CM | POA: Diagnosis not present

## 2022-02-04 DIAGNOSIS — C3412 Malignant neoplasm of upper lobe, left bronchus or lung: Secondary | ICD-10-CM | POA: Diagnosis not present

## 2022-02-04 DIAGNOSIS — C3490 Malignant neoplasm of unspecified part of unspecified bronchus or lung: Secondary | ICD-10-CM

## 2022-02-04 LAB — CMP (CANCER CENTER ONLY)
ALT: 34 U/L (ref 0–44)
AST: 34 U/L (ref 15–41)
Albumin: 2.4 g/dL — ABNORMAL LOW (ref 3.5–5.0)
Alkaline Phosphatase: 67 U/L (ref 38–126)
Anion gap: 8 (ref 5–15)
BUN: 14 mg/dL (ref 8–23)
CO2: 26 mmol/L (ref 22–32)
Calcium: 8.7 mg/dL — ABNORMAL LOW (ref 8.9–10.3)
Chloride: 99 mmol/L (ref 98–111)
Creatinine: 0.95 mg/dL (ref 0.61–1.24)
GFR, Estimated: >60 mL/min (ref >60)
Glucose, Bld: 106 mg/dL — ABNORMAL HIGH (ref 70–99)
Potassium: 4.6 mmol/L (ref 3.5–5.1)
Sodium: 133 mmol/L — ABNORMAL LOW (ref 135–145)
Total Bilirubin: 0.4 mg/dL (ref 0.3–1.2)
Total Protein: 5.5 g/dL — ABNORMAL LOW (ref 6.5–8.1)

## 2022-02-04 LAB — CBC WITH DIFFERENTIAL (CANCER CENTER ONLY)
Abs Immature Granulocytes: 0.1 10*3/uL — ABNORMAL HIGH (ref 0.00–0.07)
Basophils Absolute: 0 10*3/uL (ref 0.0–0.1)
Basophils Relative: 0 %
Eosinophils Absolute: 0.1 10*3/uL (ref 0.0–0.5)
Eosinophils Relative: 1 %
HCT: 37 % — ABNORMAL LOW (ref 39.0–52.0)
Hemoglobin: 12.3 g/dL — ABNORMAL LOW (ref 13.0–17.0)
Immature Granulocytes: 1 %
Lymphocytes Relative: 13 %
Lymphs Abs: 1.6 10*3/uL (ref 0.7–4.0)
MCH: 29 pg (ref 26.0–34.0)
MCHC: 33.2 g/dL (ref 30.0–36.0)
MCV: 87.3 fL (ref 80.0–100.0)
Monocytes Absolute: 1.1 10*3/uL — ABNORMAL HIGH (ref 0.1–1.0)
Monocytes Relative: 9 %
Neutro Abs: 9.6 10*3/uL — ABNORMAL HIGH (ref 1.7–7.7)
Neutrophils Relative %: 76 %
Platelet Count: 495 10*3/uL — ABNORMAL HIGH (ref 150–400)
RBC: 4.24 MIL/uL (ref 4.22–5.81)
RDW: 13.6 % (ref 11.5–15.5)
WBC Count: 12.5 10*3/uL — ABNORMAL HIGH (ref 4.0–10.5)
nRBC: 0 % (ref 0.0–0.2)

## 2022-02-04 MED ORDER — SODIUM CHLORIDE 0.9 % IV SOLN
296.8000 mg | Freq: Once | INTRAVENOUS | Status: AC
  Administered 2022-02-04: 300 mg via INTRAVENOUS
  Filled 2022-02-04: qty 30

## 2022-02-04 MED ORDER — SODIUM CHLORIDE 0.9 % IV SOLN
80.0000 mg/m2 | Freq: Once | INTRAVENOUS | Status: AC
  Administered 2022-02-04: 130 mg via INTRAVENOUS
  Filled 2022-02-04: qty 6.5

## 2022-02-04 MED ORDER — SODIUM CHLORIDE 0.9 % IV SOLN: Freq: Once | INTRAVENOUS | Status: AC

## 2022-02-04 MED ORDER — SODIUM CHLORIDE 0.9 % IV SOLN
10.0000 mg | Freq: Once | INTRAVENOUS | Status: AC
  Administered 2022-02-04: 10 mg via INTRAVENOUS
  Filled 2022-02-04: qty 10

## 2022-02-04 MED ORDER — PALONOSETRON HCL INJECTION 0.25 MG/5ML
0.2500 mg | Freq: Once | INTRAVENOUS | Status: AC
  Administered 2022-02-04: 0.25 mg via INTRAVENOUS
  Filled 2022-02-04: qty 5

## 2022-02-04 MED ORDER — SODIUM CHLORIDE 0.9 % IV SOLN
150.0000 mg | Freq: Once | INTRAVENOUS | Status: AC
  Administered 2022-02-04: 150 mg via INTRAVENOUS
  Filled 2022-02-04: qty 150

## 2022-02-04 MED ORDER — SODIUM CHLORIDE 0.9 % IV SOLN
1500.0000 mg | Freq: Once | INTRAVENOUS | Status: AC
  Administered 2022-02-04: 1500 mg via INTRAVENOUS
  Filled 2022-02-04: qty 30

## 2022-02-04 MED ORDER — TRILACICLIB DIHYDROCHLORIDE INJECTION 300 MG
240.0000 mg/m2 | Freq: Once | INTRAVENOUS | Status: AC
  Administered 2022-02-04: 405 mg via INTRAVENOUS
  Filled 2022-02-04: qty 27

## 2022-02-04 MED FILL — Dexamethasone Sodium Phosphate Inj 100 MG/10ML: INTRAMUSCULAR | Qty: 1 | Status: AC

## 2022-02-04 NOTE — Progress Notes (Signed)
Pt c/o of 8/10 left flank pain.  Pt took prescribed 5 mg oxycodone hydrochloride for pain at 1107.  RN made MD aware

## 2022-02-04 NOTE — Patient Instructions (Signed)
[facility]  Discharge Instructions: Thank you for choosing [facility] to provide your oncology and hematology care.   If you have a lab appointment with the [facility], please go directly to the [facility] and check in at the registration area.   Wear comfortable clothing and clothing appropriate for easy access to any Portacath or PICC line.   We strive to give you quality time with your provider. You may need to reschedule your appointment if you arrive late (15 or more minutes).  Arriving late affects you and other patients whose appointments are after yours.  Also, if you miss three or more appointments without notifying the office, you may be dismissed from the clinic at the provider's discretion.      For prescription refill requests, have your pharmacy contact our office and allow 72 hours for refills to be completed.    Today you received the following chemotherapy and/or immunotherapy agents: Cosela/Imfinzi/Carboplatin/Etoposide      To help prevent nausea and vomiting after your treatment, we encourage you to take your nausea medication as directed.  BELOW ARE SYMPTOMS THAT SHOULD BE REPORTED IMMEDIATELY: *FEVER GREATER THAN 100.4 F (38 C) OR HIGHER *CHILLS OR SWEATING *NAUSEA AND VOMITING THAT IS NOT CONTROLLED WITH YOUR NAUSEA MEDICATION *UNUSUAL SHORTNESS OF BREATH *UNUSUAL BRUISING OR BLEEDING *URINARY PROBLEMS (pain or burning when urinating, or frequent urination) *BOWEL PROBLEMS (unusual diarrhea, constipation, pain near the anus) TENDERNESS IN MOUTH AND THROAT WITH OR WITHOUT PRESENCE OF ULCERS (sore throat, sores in mouth, or a toothache) UNUSUAL RASH, SWELLING OR PAIN  UNUSUAL VAGINAL DISCHARGE OR ITCHING   Items with * indicate a potential emergency and should be followed up as soon as possible or go to the Emergency Department if any problems should occur.  Please show the CHEMOTHERAPY ALERT CARD or IMMUNOTHERAPY  ALERT CARD at check-in to the Emergency Department and triage nurse.  Should you have questions after your visit or need to cancel or reschedule your appointment, please contact [facility]  Dept: [phone]  and follow the prompts.  Office hours are 8:00 a.m. to 4:30 p.m. Monday - Friday. Please note that voicemails left after 4:00 p.m. may not be returned until the following business day.  We are closed weekends and major holidays. You have access to a nurse at all times for urgent questions. Please call the main number to the clinic Dept: [phone] and follow the prompts.   For any non-urgent questions, you may also contact your provider using MyChart. We now offer e-Visits for anyone 64 and older to request care online for non-urgent symptoms. For details visit [email or web address].   Also download the MyChart app! Go to the app store, search "MyChart", open the app, select [facility], and log in with your MyChart username and password.  Masks are optional in the cancer centers. If you would like for your care team to wear a mask while they are taking care of you, please let them know. For doctor visits, patients may have with them one support person who is at least 81 years old. At this time, visitors are not allowed in the infusion area.

## 2022-02-04 NOTE — Addendum Note (Signed)
Addended by: Dorien Chihuahua E on: 02/04/2022 02:41 PM   Modules accepted: Orders

## 2022-02-05 ENCOUNTER — Inpatient Hospital Stay: Payer: Medicare Other

## 2022-02-05 ENCOUNTER — Encounter: Payer: Self-pay | Admitting: Internal Medicine

## 2022-02-05 ENCOUNTER — Inpatient Hospital Stay (HOSPITAL_BASED_OUTPATIENT_CLINIC_OR_DEPARTMENT_OTHER): Payer: Medicare Other | Admitting: Physician Assistant

## 2022-02-05 VITALS — BP 101/57 | HR 69 | Temp 97.6°F | Resp 18

## 2022-02-05 DIAGNOSIS — R600 Localized edema: Secondary | ICD-10-CM | POA: Diagnosis not present

## 2022-02-05 DIAGNOSIS — C3412 Malignant neoplasm of upper lobe, left bronchus or lung: Secondary | ICD-10-CM | POA: Diagnosis not present

## 2022-02-05 DIAGNOSIS — C3482 Malignant neoplasm of overlapping sites of left bronchus and lung: Secondary | ICD-10-CM | POA: Diagnosis not present

## 2022-02-05 DIAGNOSIS — Z5111 Encounter for antineoplastic chemotherapy: Secondary | ICD-10-CM | POA: Diagnosis not present

## 2022-02-05 DIAGNOSIS — Z5112 Encounter for antineoplastic immunotherapy: Secondary | ICD-10-CM | POA: Diagnosis not present

## 2022-02-05 DIAGNOSIS — M7989 Other specified soft tissue disorders: Secondary | ICD-10-CM | POA: Diagnosis not present

## 2022-02-05 DIAGNOSIS — J910 Malignant pleural effusion: Secondary | ICD-10-CM | POA: Diagnosis not present

## 2022-02-05 DIAGNOSIS — K5900 Constipation, unspecified: Secondary | ICD-10-CM | POA: Diagnosis not present

## 2022-02-05 MED ORDER — SODIUM CHLORIDE 0.9 % IV SOLN
80.0000 mg/m2 | Freq: Once | INTRAVENOUS | Status: AC
  Administered 2022-02-05: 130 mg via INTRAVENOUS
  Filled 2022-02-05: qty 6.5

## 2022-02-05 MED ORDER — TRILACICLIB DIHYDROCHLORIDE INJECTION 300 MG
240.0000 mg/m2 | Freq: Once | INTRAVENOUS | Status: AC
  Administered 2022-02-05: 405 mg via INTRAVENOUS
  Filled 2022-02-05: qty 27

## 2022-02-05 MED ORDER — SODIUM CHLORIDE 0.9 % IV SOLN
10.0000 mg | Freq: Once | INTRAVENOUS | Status: AC
  Administered 2022-02-05: 10 mg via INTRAVENOUS
  Filled 2022-02-05: qty 10

## 2022-02-05 MED ORDER — SODIUM CHLORIDE 0.9 % IV SOLN: Freq: Once | INTRAVENOUS | Status: AC

## 2022-02-05 MED FILL — Dexamethasone Sodium Phosphate Inj 100 MG/10ML: INTRAMUSCULAR | Qty: 1 | Status: AC

## 2022-02-05 NOTE — Progress Notes (Signed)
Symptom Management Consult note [place]    Patient Care Team: Shon Baton, MD as PCP - General (Internal Medicine) Gwenlyn Found Pearletha Forge, MD as PCP - Cardiology (Cardiology) Hunsucker, Bonna Gains, MD as Consulting Physician (Pulmonary Disease) Valrie Hart, RN as Oncology Nurse Navigator (Oncology)    Name of the patient: Pedro Pope  [id number]  1941/06/30   Date of visit: 02/05/2022    Chief complaint/ Reason for visit- leg swelling  Oncology History  Small cell lung cancer, overlapping sites of left lung ([place])  01/30/2022 Initial Diagnosis   Small cell lung cancer, overlapping sites of left lung ([place])   01/30/2022 Cancer Staging   Staging form: Lung, AJCC 8th Edition - Clinical: Stage IVA (cT3, cN2, cM1a) - Signed by Curt Bears, MD on 01/30/2022   02/04/2022 -  Chemotherapy   Patient is on Treatment Plan : LUNG SMALL CELL EXTENSIVE STAGE Durvalumab + Carboplatin D1 + Etoposide D1-3 q21d x 4 Cycles / Durvalumab q28d       Current Therapy: carboplatin, durvalumab, etopiside  Day 1 Cycle 1 on 02/04/22  Interval history- Pedro Pope. is a 81 y.o. with oncologic history as above presenting to [facility] today with chief complaint of bilateral leg swelling x1 month.  Patient states it has worsened in the last 3 to 4 days.  He states he is ambulatory with close standby assistance.  He spends his day either sitting in a chair or in bed.  He does not always remember to elevate his legs while sitting in the chair although family does remind him frequently he states.  Patient has shortness of breath at baseline and denies any change in that recently.  He is denying any chest pain.  He denies a high salt diet.  Patient denies any injury or trauma to his legs.  He states the swelling has not been this extensive before.  He tells me his son drains his Pleurx catheter daily and has output ranging between 450-650 mLs.  He started chemotherapy yesterday and overall feels  well from that.  Denies any fever, chills, cough, abdominal pain, urinary symptoms, diarrhea.  Patient's son provides additional history.  He states that patient has Raynaud's so already has poor circulation in his extremities.  Patient is scheduled to have cardiology appointment on 7/7 for recent hospital discharge follow-up.  His past medical history history includes CAD, Dressler syndrome, gout, prostate cancer, ischemic cardiomyopathy, rheumatic fever      ROS  All other systems are reviewed and are negative for acute change except as noted in the HPI.    Allergies  Allergen Reactions   Penicillins Anaphylaxis   Corn Syrup [Glucose] Other (See Comments)    High fructose corn syrup causes gout   Aspirin Adult Low [Aspirin] Itching and Rash    Dr. [name] suspects that long term caused a rash, short term use appears to be ok     Past Medical History:  Diagnosis Date   Anosmia    CAD (coronary artery disease), native coronary artery    a. 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 placed on colchicine    ED (erectile dysfunction)    Elevated TSH    Fatty liver    Gout    Ischemic cardiomyopathy    a. 2D ECHO 11/04/14 w/ EF 40-45% with LV WMA,  G2DD, mild MR, mild LA dilation, mod TR.   PAF (paroxysmal atrial fibrillation) ([place])    a. Dx on 11/03/14 admission. Not placed on longterm AC due to DAPT w/ receent DES. Will plan for outpt heart monitor    Pre-diabetes    a. HgA1c 6.1 in 10/2014   Prostate cancer [facility])    s/p prostatectomy and XRT   Rheumatic fever 1947     Past Surgical History:  Procedure Laterality Date   CORONARY ANGIOPLASTY WITH STENT PLACEMENT  10/30/14   STEMI s/p DES to proximal LAD; residual 80 % proximal and 60% mid LCx diease   LEFT HEART CATHETERIZATION WITH CORONARY ANGIOGRAM N/A 10/30/2014   Procedure: LEFT HEART CATHETERIZATION WITH CORONARY ANGIOGRAM;   Surgeon: Lorretta Harp, MD;  Location: [facility] CATH LAB;  Service: Cardiovascular;  Laterality: N/A;   PERCUTANEOUS CORONARY STENT INTERVENTION (PCI-S)  10/30/2014   Procedure: PERCUTANEOUS CORONARY STENT INTERVENTION (PCI-S);  Surgeon: Lorretta Harp, MD;  Location: [facility] CATH LAB;  Service: Cardiovascular;;   PROSTATECTOMY     THORACENTESIS Left 12/31/2021   Procedure: THORACENTESIS;  Surgeon: Candee Furbish, MD;  Location: [facility] ENDOSCOPY;  Service: Pulmonary;  Laterality: Left;   TONSILLECTOMY     VIDEO ASSISTED THORACOSCOPY (VATS)/DECORTICATION Left 01/21/2022   Procedure: VIDEO ASSISTED THORACOSCOPY (VATS)/DECORTICATION;  Surgeon: Lajuana Matte, MD;  Location: [facility] OR;  Service: Thoracic;  Laterality: Left;    Social History   Socioeconomic History   Marital status: Married    Spouse name: Not on file   Number of children: Not on file   Years of education: Not on file   Highest education level: Not on file  Occupational History   Not on file  Tobacco Use   Smoking status: Former    Types: Cigarettes    Quit date: 08/18/1992    Years since quitting: 29.4   Smokeless tobacco: Never  Vaping Use   Vaping Use: Not on file  Substance and Sexual Activity   Alcohol use: Not Currently    Comment: social   Drug use: No   Sexual activity: Not Currently  Other Topics Concern   Not on file  Social History Narrative   Pt lives in [place] with spouse.  Realtor.  Retired Surveyor, quantity of Radio broadcast assistant Strain: Not on [employer] Insecurity: Not on file  Transportation Needs: Not on file  Physical Activity: Not on file  Stress: Not on file  Social Connections: Not on file  Intimate Partner Violence: Not on file    Family History  Problem Relation Age of Onset   Cancer Other    Coronary artery disease Brother 17       s/p CABG   Diabetes Other    Hyperlipidemia Other    Hypertension Other    Gout Other      Current Outpatient Medications:     acetaminophen (TYLENOL) 500 MG tablet, Take 500 mg by mouth every 6 (six) hours as needed for mild pain or moderate pain., Disp: , Rfl:    albuterol (VENTOLIN HFA) 108 (90 Base) MCG/ACT inhaler, Inhale 2 puffs into the lungs every 4 (four) hours as needed for shortness of breath., Disp: , Rfl:    ascorbic acid (VITAMIN C) 500 MG tablet, Take 500 mg by mouth daily., Disp: , Rfl:    Blood Pressure Monitor KIT, For daily blood pressure monitoring;  Arm Cuff, Disp: 1 each, Rfl: 0   carvedilol (COREG) 3.125 MG  tablet, TAKE 1 TABLET BY MOUTH TWICE A DAY WITH MEALS (Patient taking differently: Take 3.125 mg by mouth daily. Take 1 tablet by mouth daily), Disp: 180 tablet, Rfl: 3   Cholecalciferol (VITAMIN D3) 25 MCG (1000 UT) CAPS, Take 1 capsule by mouth daily., Disp: , Rfl:    colchicine 0.6 MG tablet, Take 1 tablet (0.6 mg total) by mouth daily. (Patient taking differently: Take 0.6 mg by mouth daily as needed (Gout).), Disp: 30 tablet, Rfl: 3   gabapentin (NEURONTIN) 100 MG capsule, TAKE ONE TO TWO CAPSULES BY MOUTH THREE TIMES DAILY as directed Orally Three times a day for 30 days, Disp: , Rfl:    ipratropium-albuterol (DUONEB) 0.5-2.5 (3) MG/3ML SOLN, Take 3 mLs by nebulization every 6 (six) hours as needed (whheze, shortness of breath, cough)., Disp: 360 mL, Rfl: 2   nitroGLYCERIN (NITROSTAT) 0.4 MG SL tablet, Place 1 tablet (0.4 mg total) under the tongue every 5 (five) minutes as needed for chest pain., Disp: 25 tablet, Rfl: 2   oxyCODONE (ROXICODONE) 5 MG immediate release tablet, Take 0.5-1 tablets (2.5-5 mg total) by mouth every 4 (four) hours as needed., Disp: 30 tablet, Rfl: 0   prochlorperazine (COMPAZINE) 10 MG tablet, Take 1 tablet (10 mg total) by mouth every 6 (six) hours as needed for nausea or vomiting., Disp: 30 tablet, Rfl: 0  PHYSICAL EXAM: ECOG FS:2 - Symptomatic, <50% confined to bed  T: 97.6   BP: 101/57    HR: 69   Resp:18   O2: 95% on 4L [place]  Physical Exam Vitals and  nursing note reviewed.  Constitutional:      Appearance: He is well-developed. He is not ill-appearing or toxic-appearing.  HENT:     Head: Normocephalic and atraumatic.     Nose: Nose normal.  Eyes:     General: No scleral icterus.       Right eye: No discharge.        Left eye: No discharge.     Conjunctiva/sclera: Conjunctivae normal.  Neck:     Vascular: No JVD.  Cardiovascular:     Rate and Rhythm: Normal rate and regular rhythm.     Pulses: Normal pulses.     Heart sounds: Normal heart sounds.  Pulmonary:     Effort: Pulmonary effort is normal.     Breath sounds: No wheezing or rales.     Comments: Decreased breath sounds throughout.  4 L nasal cannula Abdominal:     General: There is no distension.  Musculoskeletal:        General: Normal range of motion.     Cervical back: Normal range of motion.     Right lower leg: 1+ Pitting Edema present.     Left lower leg: 1+ Pitting Edema present.     Comments: Bilateral lower extremity edema extending to knees.  Compartments soft in bilateral lower extremities.  No calf tenderness or palpable cords.  No overlying skin changes.  Skin:    General: Skin is warm and dry.     Comments: Skin is pale in bilateral lower extremities.  No overlying erythema or wounds.  Equal tactile temperature in all extremities.  Neurological:     Mental Status: He is oriented to person, place, and time.     GCS: GCS eye subscore is 4. GCS verbal subscore is 5. GCS motor subscore is 6.     Comments: Fluent speech, no facial droop.  Psychiatric:        Behavior: Behavior  normal.        LABORATORY DATA: I have reviewed the data as listed    Latest Ref Rng & Units 02/04/2022    7:43 AM 01/30/2022    2:09 PM 01/18/2022    3:51 AM  CBC  WBC 4.0 - 10.5 K/uL 12.5  13.9  10.8   Hemoglobin 13.0 - 17.0 g/dL 12.3  12.6  12.3   Hematocrit 39.0 - 52.0 % 37.0  37.7  37.3   Platelets 150 - 400 K/uL 495  462  319         Latest Ref Rng & Units  02/04/2022    7:43 AM 01/30/2022    2:09 PM 01/17/2022    5:22 PM  CMP  Glucose 70 - 99 mg/dL 106  103  95   BUN 8 - 23 mg/dL 14  19  19    Creatinine 0.61 - 1.24 mg/dL 0.95  0.89  0.82   Sodium 135 - 145 mmol/L 133  135  140   Potassium 3.5 - 5.1 mmol/L 4.6  4.7  4.7   Chloride 98 - 111 mmol/L 99  101  111   CO2 22 - 32 mmol/L 26  28  22    Calcium 8.9 - 10.3 mg/dL 8.7  8.8  8.1   Total Protein 6.5 - 8.1 g/dL 5.5  5.8  4.9   Total Bilirubin 0.3 - 1.2 mg/dL 0.4  0.5  1.2   Alkaline Phos 38 - 126 U/L 67  74  61   AST 15 - 41 U/L 34  64  36   ALT 0 - 44 U/L 34  76  16        RADIOGRAPHIC STUDIES: I have personally reviewed the radiological images as listed and agreed with the findings in the report. No images are attached to the encounter. DG CHEST PORT 1 VIEW  Result Date: 01/22/2022 CLINICAL DATA:  Ischemic cardiomyopathy.  Postoperative check. EXAM: PORTABLE CHEST 1 VIEW COMPARISON:  January 17, 2022 FINDINGS: The heart size and mediastinal contours are stable. Dense consolidation of the left lung is identified with interval slight better aeration of the left lung base compared prior exam. A left chest tube is identified with the distal tip in the left apex. Subcutaneous emphysema is identified over the left chest. The right lung demonstrate emphysematous changes. The visualized skeletal structures are stable. IMPRESSION: Dense consolidation of the left lung with interval slight better aeration of the left lung base compared prior exam. Left chest tube is identified with the distal tip in the left apex. Electronically Signed   By: Abelardo Diesel M.D.   On: 01/22/2022 07:44   CT CHEST W CONTRAST  Result Date: 01/20/2022 CLINICAL DATA:  Large left pleural effusion. EXAM: CT CHEST WITH CONTRAST TECHNIQUE: Multidetector CT imaging of the chest was performed during intravenous contrast administration. RADIATION DOSE REDUCTION: This exam was performed according to the departmental dose-optimization  program which includes automated exposure control, adjustment of the mA and/or kV according to patient size and/or use of iterative reconstruction technique. CONTRAST:  111m OMNIPAQUE IOHEXOL 300 MG/ML  SOLN COMPARISON:  Recent chest CTs from May 8 and Jan 15, 2022 FINDINGS: Cardiovascular: The heart is normal in size. No pericardial effusion. The aorta is normal in caliber. Stable atherosclerotic calcifications. The pulmonary arteries are patent. Stable three-vessel coronary artery calcifications. Mediastinum/Nodes: Stable mediastinal lymphadenopathy. 2.5 cm AP window node on image number 63/3. Left hilar nodes versus hilar pleural tumor. The more lateral lesion  measures 21 mm and the more medial lesion measures 13 mm. The esophagus is grossly normal. Lungs/Pleura: Very large left pleural effusion increased in size since the prior CT scan. Essentially drowned/obstructed left lung. Extensive enhancing pleural tumor involving the left hemithorax. The heart and mediastinum are displaced to the right because of the large left hydrothorax. The right lung demonstrates advanced emphysematous changes and pulmonary scarring but no pulmonary nodules or right-sided pleural tumor. Upper Abdomen: No significant upper abdominal findings. Advanced vascular calcifications. Musculoskeletal: No significant bony findings. IMPRESSION: 1. Very large left pleural effusion increased in size since the prior CT scan. 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 2. Essentially drowned/obstructed left lung. 3. Extensive enhancing left pleural tumor and mediastinal adenopathy. 4. Advanced emphysematous changes and pulmonary scarring in the right lung but no pulmonary nodules or right-sided pleural tumor. 5. Stable advanced vascular calcifications. Aortic Atherosclerosis (ICD10-I70.0) and Emphysema (ICD10-J43.9). Electronically Signed   By: Marijo Sanes M.D.   On: 01/20/2022 16:43    CT ABDOMEN PELVIS W CONTRAST  Result Date: 01/17/2022 CLINICAL DATA:  Pain left lower quadrant of abdomen, rectal bleeding EXAM: CT ABDOMEN AND PELVIS WITH CONTRAST TECHNIQUE: Multidetector CT imaging of the abdomen and pelvis was performed using the standard protocol following bolus administration of intravenous contrast. RADIATION DOSE REDUCTION: This exam was performed according to the departmental dose-optimization program which includes automated exposure control, adjustment of the mA and/or kV according to patient size and/or use of iterative reconstruction technique. CONTRAST:  53m OMNIPAQUE IOHEXOL 300 MG/ML  SOLN COMPARISON:  Previous CT chest done on 01/15/2022, chest radiographs done earlier today FINDINGS: Lower chest: Large left pleural effusion is noted. There are peripheral pleural nodules on the left side. There is almost complete atelectasis in the visualized left lower lung fields. There is increase in interstitial markings in the periphery of right lower lung fields possibly suggesting interstitial lung disease. There are scattered coronary artery calcifications. Dense calcification is seen in the region of mitral annulus. Hepatobiliary: In the image 14 of series 4, there is 1.3 cm low-density lesion in the right lobe of liver. There is no significant dilation of bile ducts. Gallbladder is not distended. Pancreas: No focal abnormality is seen. Spleen: There is medial displacement of spleen caused by large left pleural effusion. Adrenals/Urinary Tract: Adrenals are unremarkable. There is no hydronephrosis. Left kidney is displaced medially. There is 7 mm calculus in the midportion of right kidney. Ureters are not dilated. Urinary bladder is unremarkable. Stomach/Bowel: Stomach is not distended. Small bowel loops are not dilated. Appendix is not dilated. Appendix is noted in the right pelvic cavity. There is no significant wall thickening in colon. There is no pericolic stranding.  Vascular/Lymphatic: Extensive calcifications are seen in the abdominal aorta and its major branches. Some of the coarse calcifications are noted projecting into the lumen of abdominal aorta. Reproductive: There is evidence of previous surgical removal of prostate. There are multiple surgical clips in both sides of pelvis. Other: There is no ascites or pneumoperitoneum. Umbilical hernia containing fat is seen. Musculoskeletal: Degenerative changes are noted in both hips, more so on the right side. No definite focal lytic or sclerotic lesions are seen. IMPRESSION: Massive left pleural effusion. There are numerous nodules in the periphery of left pleural space suggesting malignant neoplastic process such as mesothelioma or pleural metastatic disease. There is compression atelectasis of visualized left lower lung fields. There is prominence of interstitial markings in  the periphery of right lower lung fields suggesting scarring from interstitial lung disease. There is no evidence of intestinal obstruction or pneumoperitoneum. Appendix is not dilated. There is no hydronephrosis. There is 7 mm nonobstructing right renal stone. There is 13 mm low-density lesion in the right lobe of liver which is not fully evaluated. This may suggest hemangioma or malignant neoplastic process such as metastatic disease. Other findings as described in the body of the report. Electronically Signed   By: Elmer Picker M.D.   On: 01/17/2022 20:06   DG Chest 2 View  Result Date: 01/17/2022 CLINICAL DATA:  Worsening hypoxia. Known left pleural effusion recurrence EXAM: CHEST - 2 VIEW COMPARISON:  Chest CT 01/15/2022.  Radiograph 12/31/2021 FINDINGS: Large partially loculated left pleural effusion is not significantly changed from recent chest CT. Lateral component tracks towards the apex. There is underlying left lung opacities. Right lung emphysema without acute findings. Heart size is obscured by lung opacity. No pneumothorax.  IMPRESSION: 1. Large partially loculated left pleural effusion, not significantly changed from CT 2 days ago. Underlying left lung opacities were better assessed on recent CT. 2. Emphysema.  No acute right lung findings. Electronically Signed   By: Keith Rake M.D.   On: 01/17/2022 17:08   CT Chest Wo Contrast  Result Date: 01/17/2022 CLINICAL DATA:  Follow-up pneumonia, parapneumonic effusion * Tracking Code: BO * EXAM: CT CHEST WITHOUT CONTRAST TECHNIQUE: Multidetector CT imaging of the chest was performed following the standard protocol without IV contrast. RADIATION DOSE REDUCTION: This exam was performed according to the departmental dose-optimization program which includes automated exposure control, adjustment of the mA and/or kV according to patient size and/or use of iterative reconstruction technique. COMPARISON:  12/23/2021 FINDINGS: Cardiovascular: Aortic atherosclerosis. Aortic valve calcifications. Normal heart size. Subendocardial fibrofatty scarring of the left ventricular apex in keeping with prior infarction (series 2, image 117). Three-vessel coronary artery calcifications. No pericardial effusion. Mediastinum/Nodes: Interval enlargement of pretracheal, AP window, and left hilar lymph nodes, largest AP window node measuring 2.0 x 1.9 cm, previously 1.7 x 1.5 cm (series 2, image 66). Thyroid gland, trachea, and esophagus demonstrate no significant findings. Lungs/Pleura: Interval increase in volume of a left pleural effusion, now large, with extensive associated pleural thickening and nodularity throughout the left hemithorax (series 2, image 138). There is now essentially complete consolidation and or atelectasis of the left upper lobe sans lingula, with proximal obstruction of the left upper lobe bronchus (series 5, image 77). Moderate to severe underlying centrilobular and paraseptal emphysema. Mild pulmonary fibrosis of the aerated right lung, characterized by irregular peripheral  interstitial opacity, septal thickening, and areas of subpleural bronchiolectasis at the right lung base. Upper Abdomen: No acute abnormality. Nonobstructive right nephrolithiasis. Musculoskeletal: No chest wall abnormality. No suspicious osseous lesions identified. IMPRESSION: 1. Interval increase in volume of a left pleural effusion, now large, with extensive associated pleural thickening and nodularity throughout the left hemithorax. There is now essentially complete consolidation and or atelectasis of the left upper lobe sans lingula, with proximal obstruction of the left upper lobe bronchus. Findings are most consistent with underlying primary lung malignancy and associated pleural metastatic disease. 2. Interval enlargement of pretracheal, AP window, and left hilar lymph nodes, which may be reactive but are highly suspicious for nodal metastatic disease. 3. Underlying emphysema. 4. Mild pulmonary fibrosis of the aerated right lung, characterized by irregular peripheral interstitial opacity, septal thickening, and areas of subpleural bronchiolectasis at the right lung base. Although not optimally characterized by this  non tailored examination, findings are categorized as probable UIP per consensus guidelines: Diagnosis of Idiopathic Pulmonary Fibrosis: An Official ATS/ERS/JRS/ALAT Clinical Practice Guideline. [place], Iss 5, [phone], Apr 18 2017. 5. Coronary artery disease. 6. Aortic valve calcifications. Correlate for echocardiographic evidence of aortic valve dysfunction. 7. Nonobstructive right nephrolithiasis. These results will be called to the ordering clinician or representative by the Radiologist Assistant, and communication documented in the PACS or [employer]. Aortic Atherosclerosis (ICD10-I70.0) and Emphysema (ICD10-J43.9). Electronically Signed   By: Delanna Ahmadi M.D.   On: 01/17/2022 09:00     ASSESSMENT & PLAN: Patient is a 81 y.o. male  with oncologic history  of recent diagnosis of small cell lung cancer followed by Dr. Earlie Server.  I have viewed most recent oncology note and lab work.   #)Bilateral lower extremity edema-patient is nontoxic-appearing.  He is on 4 L nasal cannula which is his baseline oxygen requirement.  Exam without any signs of infection to bilateral lower extremities.  No palpable cords or calf tenderness present and with bilateral swelling DVT is less likely.  Patient is not a great candidate for Lasix as he has history of hypotension and is on Coreg. BP today and recently are soft.  Chart review shows he had an echo in 2016 with an EF of 40 to 45%.  While his kidney function is normal today we will hold off on Lasix at this time.  [facility] RN was able to reschedule his cardiology appointment for 6/23 so he can have further work-up of this bilateral lower extremity edema. Appreciate cardiology assistance in seeing the patient sooner. Encouraged him to at least try to elevate legs at home. Patient and son are agreeable with plan of care.  #)Small cell lung cancer-patient is here for second treatment today and overall feels well after his first treatment yesterday.  Patient will return tomorrow for third treatment.  Next appointment with oncologist is 02/11/2022.  Strict ED precautions discussed should symptoms worsen.   Visit Diagnosis: 1. Bilateral lower extremity edema   2. Small cell lung cancer, overlapping sites of left lung ([place])      No orders of the defined types were placed in this encounter.   All questions were answered. The patient knows to call the clinic with any problems, questions or concerns. No barriers to learning was detected.  I have spent a total of 20 minutes minutes of face-to-face and non-face-to-face time, preparing to see the patient, obtaining and/or reviewing separately obtained history, performing a medically appropriate examination, counseling and educating the patient,  documenting clinical information in  the electronic health record, and care coordination.     Thank you for allowing me to participate in the care of this patient.    Barrie Folk, PA-C Department of Hematology/Oncology [facility] Phone: [phone]  [phone]    02/05/2022 12:48 PM

## 2022-02-05 NOTE — Progress Notes (Signed)
My card given by registration per my request.

## 2022-02-05 NOTE — Patient Instructions (Signed)
[facility]  Discharge Instructions: Thank you for choosing [facility] to provide your oncology and hematology care.   If you have a lab appointment with the [facility], please go directly to the [facility] and check in at the registration area.   Wear comfortable clothing and clothing appropriate for easy access to any Portacath or PICC line.   We strive to give you quality time with your provider. You may need to reschedule your appointment if you arrive late (15 or more minutes).  Arriving late affects you and other patients whose appointments are after yours.  Also, if you miss three or more appointments without notifying the office, you may be dismissed from the clinic at the provider's discretion.      For prescription refill requests, have your pharmacy contact our office and allow 72 hours for refills to be completed.    Today you received the following chemotherapy and/or immunotherapy agents: Etoposide      To help prevent nausea and vomiting after your treatment, we encourage you to take your nausea medication as directed.  BELOW ARE SYMPTOMS THAT SHOULD BE REPORTED IMMEDIATELY: *FEVER GREATER THAN 100.4 F (38 C) OR HIGHER *CHILLS OR SWEATING *NAUSEA AND VOMITING THAT IS NOT CONTROLLED WITH YOUR NAUSEA MEDICATION *UNUSUAL SHORTNESS OF BREATH *UNUSUAL BRUISING OR BLEEDING *URINARY PROBLEMS (pain or burning when urinating, or frequent urination) *BOWEL PROBLEMS (unusual diarrhea, constipation, pain near the anus) TENDERNESS IN MOUTH AND THROAT WITH OR WITHOUT PRESENCE OF ULCERS (sore throat, sores in mouth, or a toothache) UNUSUAL RASH, SWELLING OR PAIN  UNUSUAL VAGINAL DISCHARGE OR ITCHING   Items with * indicate a potential emergency and should be followed up as soon as possible or go to the Emergency Department if any problems should occur.  Please show the CHEMOTHERAPY ALERT CARD or IMMUNOTHERAPY ALERT CARD at check-in to  the Emergency Department and triage nurse.  Should you have questions after your visit or need to cancel or reschedule your appointment, please contact [facility]  Dept: [phone]  and follow the prompts.  Office hours are 8:00 a.m. to 4:30 p.m. Monday - Friday. Please note that voicemails left after 4:00 p.m. may not be returned until the following business day.  We are closed weekends and major holidays. You have access to a nurse at all times for urgent questions. Please call the main number to the clinic Dept: [phone] and follow the prompts.   For any non-urgent questions, you may also contact your provider using MyChart. We now offer e-Visits for anyone 46 and older to request care online for non-urgent symptoms. For details visit [email or web address].   Also download the MyChart app! Go to the app store, search "MyChart", open the app, select [facility], and log in with your MyChart username and password.  Masks are optional in the cancer centers. If you would like for your care team to wear a mask while they are taking care of you, please let them know. For doctor visits, patients may have with them one support person who is at least 81 years old. At this time, visitors are not allowed in the infusion area. Etoposide, VP-16 injection What is this medication? ETOPOSIDE, VP-16 (e toe POE side) is a chemotherapy drug. It is used to treat testicular cancer, lung cancer, and other cancers. This medicine may be used for other purposes; ask your health care provider or pharmacist if you have questions. COMMON BRAND NAME(S):  Etopophos, Toposar, VePesid What should I tell my care team before I take this medication? They need to know if you have any of these conditions: infection kidney disease liver disease low blood counts, like low white cell, platelet, or red cell counts an unusual or allergic reaction to etoposide, other medicines, foods, dyes, or  preservatives pregnant or trying to get pregnant breast-feeding How should I use this medication? This medicine is for infusion into a vein. It is administered in a hospital or clinic by a specially trained health care professional. Talk to your pediatrician regarding the use of this medicine in children. Special care may be needed. Overdosage: If you think you have taken too much of this medicine contact a poison control center or emergency room at once. NOTE: This medicine is only for you. Do not share this medicine with others. What if I miss a dose? It is important not to miss your dose. Call your doctor or health care professional if you are unable to keep an appointment. What may interact with this medication? This medicine may interact with the following medications: warfarin This list may not describe all possible interactions. Give your health care provider a list of all the medicines, herbs, non-prescription drugs, or dietary supplements you use. Also tell them if you smoke, drink alcohol, or use illegal drugs. Some items may interact with your medicine. What should I watch for while using this medication? Visit your doctor for checks on your progress. This drug may make you feel generally unwell. This is not uncommon, as chemotherapy can affect healthy cells as well as cancer cells. Report any side effects. Continue your course of treatment even though you feel ill unless your doctor tells you to stop. In some cases, you may be given additional medicines to help with side effects. Follow all directions for their use. Call your doctor or health care professional for advice if you get a fever, chills or sore throat, or other symptoms of a cold or flu. Do not treat yourself. This drug decreases your body's ability to fight infections. Try to avoid being around people who are sick. This medicine may increase your risk to bruise or bleed. Call your doctor or health care professional if you  notice any unusual bleeding. Talk to your doctor about your risk of cancer. You may be more at risk for certain types of cancers if you take this medicine. Do not become pregnant while taking this medicine or for at least 6 months after stopping it. Women should inform their doctor if they wish to become pregnant or think they might be pregnant. Women of child-bearing potential will need to have a negative pregnancy test before starting this medicine. There is a potential for serious side effects to an unborn child. Talk to your health care professional or pharmacist for more information. Do not breast-feed an infant while taking this medicine. Men must use a latex condom during sexual contact with a woman while taking this medicine and for at least 4 months after stopping it. A latex condom is needed even if you have had a vasectomy. Contact your doctor right away if your partner becomes pregnant. Do not donate sperm while taking this medicine and for at least 4 months after you stop taking this medicine. Men should inform their doctors if they wish to father a child. This medicine may lower sperm counts. What side effects may I notice from receiving this medication? Side effects that you should report to your doctor  or health care professional as soon as possible: allergic reactions like skin rash, itching or hives, swelling of the face, lips, or tongue low blood counts - this medicine may decrease the number of white blood cells, red blood cells, and platelets. You may be at increased risk for infections and bleeding nausea, vomiting redness, blistering, peeling or loosening of the skin, including inside the mouth signs and symptoms of infection like fever; chills; cough; sore throat; pain or trouble passing urine signs and symptoms of low red blood cells or anemia such as unusually weak or tired; feeling faint or lightheaded; falls; breathing problems unusual bruising or bleeding Side effects that  usually do not require medical attention (report to your doctor or health care professional if they continue or are bothersome): changes in taste diarrhea hair loss loss of appetite mouth sores This list may not describe all possible side effects. Call your doctor for medical advice about side effects. You may report side effects to FDA at 1-800-FDA-1088. Where should I keep my medication? This drug is given in a hospital or clinic and will not be stored at home. NOTE: This sheet is a summary. It may not cover all possible information. If you have questions about this medicine, talk to your doctor, pharmacist, or health care provider.  2023 Elsevier/Gold Standard (2021-07-05 00:00:00)

## 2022-02-05 NOTE — Patient Instructions (Signed)
New Cardiology Appointment is for this Friday 02/07/22 at 8:50 at the [facility] at [facility] located at [address], [place], [place] [address].   We just accepted this new appointment for you so it might not be changed in the computer/MyChart yet.   You also have an appointment scheduled that day with Dr. Kipp Brood at 10:50 at [facility] [address], [place], [place] [address]. We recommend you call the office to let them know you might be late or try to reschedule that appointment. The office number is [phone]

## 2022-02-06 ENCOUNTER — Other Ambulatory Visit: Payer: Self-pay

## 2022-02-06 ENCOUNTER — Inpatient Hospital Stay: Payer: Medicare Other

## 2022-02-06 ENCOUNTER — Other Ambulatory Visit: Payer: Self-pay | Admitting: Thoracic Surgery (Cardiothoracic Vascular Surgery)

## 2022-02-06 VITALS — BP 115/63 | HR 65 | Temp 97.4°F | Resp 18

## 2022-02-06 DIAGNOSIS — Z4801 Encounter for change or removal of surgical wound dressing: Secondary | ICD-10-CM | POA: Diagnosis not present

## 2022-02-06 DIAGNOSIS — Z5111 Encounter for antineoplastic chemotherapy: Secondary | ICD-10-CM | POA: Diagnosis not present

## 2022-02-06 DIAGNOSIS — J9601 Acute respiratory failure with hypoxia: Secondary | ICD-10-CM | POA: Diagnosis not present

## 2022-02-06 DIAGNOSIS — Z5112 Encounter for antineoplastic immunotherapy: Secondary | ICD-10-CM | POA: Diagnosis not present

## 2022-02-06 DIAGNOSIS — K5900 Constipation, unspecified: Secondary | ICD-10-CM | POA: Diagnosis not present

## 2022-02-06 DIAGNOSIS — J942 Hemothorax: Secondary | ICD-10-CM | POA: Diagnosis not present

## 2022-02-06 DIAGNOSIS — M7989 Other specified soft tissue disorders: Secondary | ICD-10-CM | POA: Diagnosis not present

## 2022-02-06 DIAGNOSIS — J910 Malignant pleural effusion: Secondary | ICD-10-CM | POA: Diagnosis not present

## 2022-02-06 DIAGNOSIS — Z48813 Encounter for surgical aftercare following surgery on the respiratory system: Secondary | ICD-10-CM | POA: Diagnosis not present

## 2022-02-06 DIAGNOSIS — C3482 Malignant neoplasm of overlapping sites of left bronchus and lung: Secondary | ICD-10-CM

## 2022-02-06 DIAGNOSIS — C3412 Malignant neoplasm of upper lobe, left bronchus or lung: Secondary | ICD-10-CM | POA: Diagnosis not present

## 2022-02-06 DIAGNOSIS — C3490 Malignant neoplasm of unspecified part of unspecified bronchus or lung: Secondary | ICD-10-CM | POA: Diagnosis not present

## 2022-02-06 MED ORDER — TRILACICLIB DIHYDROCHLORIDE INJECTION 300 MG
240.0000 mg/m2 | Freq: Once | INTRAVENOUS | Status: AC
  Administered 2022-02-06: 405 mg via INTRAVENOUS
  Filled 2022-02-06: qty 27

## 2022-02-06 MED ORDER — SODIUM CHLORIDE 0.9 % IV SOLN
80.0000 mg/m2 | Freq: Once | INTRAVENOUS | Status: AC
  Administered 2022-02-06: 130 mg via INTRAVENOUS
  Filled 2022-02-06: qty 6.5

## 2022-02-06 MED ORDER — SODIUM CHLORIDE 0.9 % IV SOLN
10.0000 mg | Freq: Once | INTRAVENOUS | Status: AC
  Administered 2022-02-06: 10 mg via INTRAVENOUS
  Filled 2022-02-06: qty 10

## 2022-02-06 MED ORDER — SODIUM CHLORIDE 0.9 % IV SOLN: Freq: Once | INTRAVENOUS | Status: AC

## 2022-02-06 NOTE — Patient Instructions (Signed)
[facility]  Discharge Instructions:  For Constipation:Try milk of magnesia.  He should take stool softeners(Colace) to stay regular and drink water and fruits and veggies Thank you for choosing [facility] to provide your oncology and hematology care.   If you have a lab appointment with the [facility], please go directly to the [facility] and check in at the registration area.   Wear comfortable clothing and clothing appropriate for easy access to any Portacath or PICC line.   We strive to give you quality time with your provider. You may need to reschedule your appointment if you arrive late (15 or more minutes).  Arriving late affects you and other patients whose appointments are after yours.  Also, if you miss three or more appointments without notifying the office, you may be dismissed from the clinic at the provider's discretion.      For prescription refill requests, have your pharmacy contact our office and allow 72 hours for refills to be completed.    Today you received the following chemotherapy and/or immunotherapy agents: Etoposide      To help prevent nausea and vomiting after your treatment, we encourage you to take your nausea medication as directed.  BELOW ARE SYMPTOMS THAT SHOULD BE REPORTED IMMEDIATELY: *FEVER GREATER THAN 100.4 F (38 C) OR HIGHER *CHILLS OR SWEATING *NAUSEA AND VOMITING THAT IS NOT CONTROLLED WITH YOUR NAUSEA MEDICATION *UNUSUAL SHORTNESS OF BREATH *UNUSUAL BRUISING OR BLEEDING *URINARY PROBLEMS (pain or burning when urinating, or frequent urination) *BOWEL PROBLEMS (unusual diarrhea, constipation, pain near the anus) TENDERNESS IN MOUTH AND THROAT WITH OR WITHOUT PRESENCE OF ULCERS (sore throat, sores in mouth, or a toothache) UNUSUAL RASH, SWELLING OR PAIN  UNUSUAL VAGINAL DISCHARGE OR ITCHING   Items with * indicate a potential emergency and should be followed up as soon as possible or go to the  Emergency Department if any problems should occur.  Please show the CHEMOTHERAPY ALERT CARD or IMMUNOTHERAPY ALERT CARD at check-in to the Emergency Department and triage nurse.  Should you have questions after your visit or need to cancel or reschedule your appointment, please contact [facility]  Dept: [phone]  and follow the prompts.  Office hours are 8:00 a.m. to 4:30 p.m. Monday - Friday. Please note that voicemails left after 4:00 p.m. may not be returned until the following business day.  We are closed weekends and major holidays. You have access to a nurse at all times for urgent questions. Please call the main number to the clinic Dept: [phone] and follow the prompts.   For any non-urgent questions, you may also contact your provider using MyChart. We now offer e-Visits for anyone 10 and older to request care online for non-urgent symptoms. For details visit [email or web address].   Also download the MyChart app! Go to the app store, search "MyChart", open the app, select [facility], and log in with your MyChart username and password.  Masks are optional in the cancer centers. If you would like for your care team to wear a mask while they are taking care of you, please let them know. For doctor visits, patients may have with them one support person who is at least 81 years old. At this time, visitors are not allowed in the infusion area. Etoposide, VP-16 injection What is this medication? ETOPOSIDE, VP-16 (e toe POE side) is a chemotherapy drug. It is used to treat testicular cancer, lung cancer, and other cancers.  This medicine may be used for other purposes; ask your health care provider or pharmacist if you have questions. COMMON BRAND NAME(S): Etopophos, Toposar, VePesid What should I tell my care team before I take this medication? They need to know if you have any of these conditions: infection kidney disease liver disease low blood  counts, like low white cell, platelet, or red cell counts an unusual or allergic reaction to etoposide, other medicines, foods, dyes, or preservatives pregnant or trying to get pregnant breast-feeding How should I use this medication? This medicine is for infusion into a vein. It is administered in a hospital or clinic by a specially trained health care professional. Talk to your pediatrician regarding the use of this medicine in children. Special care may be needed. Overdosage: If you think you have taken too much of this medicine contact a poison control center or emergency room at once. NOTE: This medicine is only for you. Do not share this medicine with others. What if I miss a dose? It is important not to miss your dose. Call your doctor or health care professional if you are unable to keep an appointment. What may interact with this medication? This medicine may interact with the following medications: warfarin This list may not describe all possible interactions. Give your health care provider a list of all the medicines, herbs, non-prescription drugs, or dietary supplements you use. Also tell them if you smoke, drink alcohol, or use illegal drugs. Some items may interact with your medicine. What should I watch for while using this medication? Visit your doctor for checks on your progress. This drug may make you feel generally unwell. This is not uncommon, as chemotherapy can affect healthy cells as well as cancer cells. Report any side effects. Continue your course of treatment even though you feel ill unless your doctor tells you to stop. In some cases, you may be given additional medicines to help with side effects. Follow all directions for their use. Call your doctor or health care professional for advice if you get a fever, chills or sore throat, or other symptoms of a cold or flu. Do not treat yourself. This drug decreases your body's ability to fight infections. Try to avoid being  around people who are sick. This medicine may increase your risk to bruise or bleed. Call your doctor or health care professional if you notice any unusual bleeding. Talk to your doctor about your risk of cancer. You may be more at risk for certain types of cancers if you take this medicine. Do not become pregnant while taking this medicine or for at least 6 months after stopping it. Women should inform their doctor if they wish to become pregnant or think they might be pregnant. Women of child-bearing potential will need to have a negative pregnancy test before starting this medicine. There is a potential for serious side effects to an unborn child. Talk to your health care professional or pharmacist for more information. Do not breast-feed an infant while taking this medicine. Men must use a latex condom during sexual contact with a woman while taking this medicine and for at least 4 months after stopping it. A latex condom is needed even if you have had a vasectomy. Contact your doctor right away if your partner becomes pregnant. Do not donate sperm while taking this medicine and for at least 4 months after you stop taking this medicine. Men should inform their doctors if they wish to father a child. This medicine may  lower sperm counts. What side effects may I notice from receiving this medication? Side effects that you should report to your doctor or health care professional as soon as possible: allergic reactions like skin rash, itching or hives, swelling of the face, lips, or tongue low blood counts - this medicine may decrease the number of white blood cells, red blood cells, and platelets. You may be at increased risk for infections and bleeding nausea, vomiting redness, blistering, peeling or loosening of the skin, including inside the mouth signs and symptoms of infection like fever; chills; cough; sore throat; pain or trouble passing urine signs and symptoms of low red blood cells or anemia  such as unusually weak or tired; feeling faint or lightheaded; falls; breathing problems unusual bruising or bleeding Side effects that usually do not require medical attention (report to your doctor or health care professional if they continue or are bothersome): changes in taste diarrhea hair loss loss of appetite mouth sores This list may not describe all possible side effects. Call your doctor for medical advice about side effects. You may report side effects to FDA at 1-800-FDA-1088. Where should I keep my medication? This drug is given in a hospital or clinic and will not be stored at home. NOTE: This sheet is a summary. It may not cover all possible information. If you have questions about this medicine, talk to your doctor, pharmacist, or health care provider.  2023 Elsevier/Gold Standard (2021-07-05 00:00:00)

## 2022-02-07 ENCOUNTER — Ambulatory Visit
Admission: RE | Admit: 2022-02-07 | Discharge: 2022-02-07 | Disposition: A | Discharge status: Home / Self Care | Payer: Medicare Other | Source: Ambulatory Visit | Attending: Thoracic Surgery (Cardiothoracic Vascular Surgery) | Admitting: Thoracic Surgery (Cardiothoracic Vascular Surgery)

## 2022-02-07 ENCOUNTER — Other Ambulatory Visit: Payer: Self-pay

## 2022-02-07 ENCOUNTER — Ambulatory Visit (INDEPENDENT_AMBULATORY_CARE_PROVIDER_SITE_OTHER): Payer: Self-pay | Admitting: Thoracic Surgery (Cardiothoracic Vascular Surgery)

## 2022-02-07 ENCOUNTER — Encounter: Payer: Self-pay | Admitting: *Deleted

## 2022-02-07 ENCOUNTER — Ambulatory Visit (INDEPENDENT_AMBULATORY_CARE_PROVIDER_SITE_OTHER): Payer: Medicare Other | Admitting: Nurse Practitioner

## 2022-02-07 ENCOUNTER — Emergency Department (HOSPITAL_COMMUNITY): Payer: Medicare Other

## 2022-02-07 ENCOUNTER — Encounter: Payer: Self-pay | Admitting: Nurse Practitioner

## 2022-02-07 ENCOUNTER — Telehealth: Payer: Self-pay | Admitting: *Deleted

## 2022-02-07 ENCOUNTER — Emergency Department (HOSPITAL_COMMUNITY)
Admission: EM | Admit: 2022-02-07 | Discharge: 2022-02-08 | Disposition: A | Discharge status: Home / Self Care | Payer: Medicare Other | Attending: Emergency Medicine | Admitting: Emergency Medicine

## 2022-02-07 VITALS — BP 106/8 | HR 67 | Ht 69.0 in | Wt 139.8 lb

## 2022-02-07 VITALS — BP 107/60 | HR 72 | Resp 20 | Ht 69.0 in | Wt 139.0 lb

## 2022-02-07 DIAGNOSIS — Z85828 Personal history of other malignant neoplasm of skin: Secondary | ICD-10-CM | POA: Diagnosis not present

## 2022-02-07 DIAGNOSIS — J439 Emphysema, unspecified: Secondary | ICD-10-CM | POA: Diagnosis not present

## 2022-02-07 DIAGNOSIS — I480 Paroxysmal atrial fibrillation: Secondary | ICD-10-CM | POA: Diagnosis not present

## 2022-02-07 DIAGNOSIS — M7989 Other specified soft tissue disorders: Secondary | ICD-10-CM

## 2022-02-07 DIAGNOSIS — R609 Edema, unspecified: Secondary | ICD-10-CM | POA: Diagnosis not present

## 2022-02-07 DIAGNOSIS — I5022 Chronic systolic (congestive) heart failure: Secondary | ICD-10-CM

## 2022-02-07 DIAGNOSIS — I2510 Atherosclerotic heart disease of native coronary artery without angina pectoris: Secondary | ICD-10-CM

## 2022-02-07 DIAGNOSIS — C3482 Malignant neoplasm of overlapping sites of left bronchus and lung: Secondary | ICD-10-CM

## 2022-02-07 DIAGNOSIS — K5900 Constipation, unspecified: Secondary | ICD-10-CM | POA: Diagnosis not present

## 2022-02-07 DIAGNOSIS — Z48813 Encounter for surgical aftercare following surgery on the respiratory system: Secondary | ICD-10-CM | POA: Diagnosis not present

## 2022-02-07 DIAGNOSIS — D72828 Other elevated white blood cell count: Secondary | ICD-10-CM | POA: Diagnosis not present

## 2022-02-07 DIAGNOSIS — R600 Localized edema: Secondary | ICD-10-CM | POA: Diagnosis not present

## 2022-02-07 DIAGNOSIS — R7303 Prediabetes: Secondary | ICD-10-CM

## 2022-02-07 DIAGNOSIS — Z9889 Other specified postprocedural states: Secondary | ICD-10-CM | POA: Diagnosis not present

## 2022-02-07 DIAGNOSIS — J9601 Acute respiratory failure with hypoxia: Secondary | ICD-10-CM | POA: Diagnosis not present

## 2022-02-07 DIAGNOSIS — Z09 Encounter for follow-up examination after completed treatment for conditions other than malignant neoplasm: Secondary | ICD-10-CM

## 2022-02-07 DIAGNOSIS — R1032 Left lower quadrant pain: Secondary | ICD-10-CM | POA: Diagnosis present

## 2022-02-07 DIAGNOSIS — J948 Other specified pleural conditions: Secondary | ICD-10-CM

## 2022-02-07 DIAGNOSIS — C801 Malignant (primary) neoplasm, unspecified: Secondary | ICD-10-CM | POA: Diagnosis not present

## 2022-02-07 DIAGNOSIS — J90 Pleural effusion, not elsewhere classified: Secondary | ICD-10-CM | POA: Diagnosis not present

## 2022-02-07 DIAGNOSIS — Z4801 Encounter for change or removal of surgical wound dressing: Secondary | ICD-10-CM | POA: Diagnosis not present

## 2022-02-07 DIAGNOSIS — J910 Malignant pleural effusion: Secondary | ICD-10-CM | POA: Diagnosis not present

## 2022-02-07 DIAGNOSIS — C3490 Malignant neoplasm of unspecified part of unspecified bronchus or lung: Secondary | ICD-10-CM | POA: Diagnosis not present

## 2022-02-07 DIAGNOSIS — C7800 Secondary malignant neoplasm of unspecified lung: Secondary | ICD-10-CM | POA: Diagnosis not present

## 2022-02-07 DIAGNOSIS — J942 Hemothorax: Secondary | ICD-10-CM | POA: Diagnosis not present

## 2022-02-07 DIAGNOSIS — N200 Calculus of kidney: Secondary | ICD-10-CM | POA: Diagnosis not present

## 2022-02-07 DIAGNOSIS — I959 Hypotension, unspecified: Secondary | ICD-10-CM | POA: Diagnosis not present

## 2022-02-07 DIAGNOSIS — I255 Ischemic cardiomyopathy: Secondary | ICD-10-CM

## 2022-02-07 LAB — URINALYSIS, ROUTINE W REFLEX MICROSCOPIC
Bacteria, UA: NONE SEEN
Bilirubin Urine: NEGATIVE
Glucose, UA: NEGATIVE mg/dL
Hgb urine dipstick: NEGATIVE
Ketones, ur: NEGATIVE mg/dL
Nitrite: NEGATIVE
Protein, ur: NEGATIVE mg/dL
Specific Gravity, Urine: 1.018 (ref 1.005–1.030)
pH: 6 (ref 5.0–8.0)

## 2022-02-07 LAB — CBC WITH DIFFERENTIAL/PLATELET
Abs Immature Granulocytes: 0.31 10*3/uL — ABNORMAL HIGH (ref 0.00–0.07)
Basophils Absolute: 0 10*3/uL (ref 0.0–0.1)
Basophils Relative: 0 %
Eosinophils Absolute: 0 10*3/uL (ref 0.0–0.5)
Eosinophils Relative: 0 %
HCT: 35.9 % — ABNORMAL LOW (ref 39.0–52.0)
Hemoglobin: 11.6 g/dL — ABNORMAL LOW (ref 13.0–17.0)
Immature Granulocytes: 1 %
Lymphocytes Relative: 5 %
Lymphs Abs: 1.1 10*3/uL (ref 0.7–4.0)
MCH: 29.1 pg (ref 26.0–34.0)
MCHC: 32.3 g/dL (ref 30.0–36.0)
MCV: 90 fL (ref 80.0–100.0)
Monocytes Absolute: 0.2 10*3/uL (ref 0.1–1.0)
Monocytes Relative: 1 %
Neutro Abs: 20.6 10*3/uL — ABNORMAL HIGH (ref 1.7–7.7)
Neutrophils Relative %: 93 %
Platelets: 454 10*3/uL — ABNORMAL HIGH (ref 150–400)
RBC: 3.99 MIL/uL — ABNORMAL LOW (ref 4.22–5.81)
RDW: 13.8 % (ref 11.5–15.5)
WBC: 22.2 10*3/uL — ABNORMAL HIGH (ref 4.0–10.5)
nRBC: 0 % (ref 0.0–0.2)

## 2022-02-07 LAB — COMPREHENSIVE METABOLIC PANEL
ALT: 39 U/L (ref 0–44)
AST: 34 U/L (ref 15–41)
Albumin: 2.1 g/dL — ABNORMAL LOW (ref 3.5–5.0)
Alkaline Phosphatase: 72 U/L (ref 38–126)
Anion gap: 7 (ref 5–15)
BUN: 34 mg/dL — ABNORMAL HIGH (ref 8–23)
CO2: 26 mmol/L (ref 22–32)
Calcium: 8.9 mg/dL (ref 8.9–10.3)
Chloride: 106 mmol/L (ref 98–111)
Creatinine, Ser: 1.01 mg/dL (ref 0.61–1.24)
GFR, Estimated: >60 mL/min (ref >60)
Glucose, Bld: 90 mg/dL (ref 70–99)
Potassium: 5.1 mmol/L (ref 3.5–5.1)
Sodium: 139 mmol/L (ref 135–145)
Total Bilirubin: 0.5 mg/dL (ref 0.3–1.2)
Total Protein: 5.5 g/dL — ABNORMAL LOW (ref 6.5–8.1)

## 2022-02-07 LAB — I-STAT CHEM 8, ED
BUN: 30 mg/dL — ABNORMAL HIGH (ref 8–23)
Calcium, Ion: 1.21 mmol/L (ref 1.15–1.40)
Chloride: 102 mmol/L (ref 98–111)
Creatinine, Ser: 1 mg/dL (ref 0.61–1.24)
Glucose, Bld: 85 mg/dL (ref 70–99)
HCT: 34 % — ABNORMAL LOW (ref 39.0–52.0)
Hemoglobin: 11.6 g/dL — ABNORMAL LOW (ref 13.0–17.0)
Potassium: 4.8 mmol/L (ref 3.5–5.1)
Sodium: 135 mmol/L (ref 135–145)
TCO2: 24 mmol/L (ref 22–32)

## 2022-02-07 MED ORDER — MILK AND MOLASSES ENEMA
1.0000 | Freq: Once | RECTAL | Status: AC
  Administered 2022-02-08: 240 mL via RECTAL
  Filled 2022-02-07: qty 240

## 2022-02-07 MED ORDER — IOHEXOL 300 MG/ML  SOLN
100.0000 mL | Freq: Once | INTRAMUSCULAR | Status: AC | PRN
Start: 2022-02-07 — End: 2022-02-07
  Administered 2022-02-07: 100 mL via INTRAVENOUS

## 2022-02-07 MED ORDER — SODIUM CHLORIDE (PF) 0.9 % IJ SOLN
INTRAMUSCULAR | Status: AC
  Filled 2022-02-07: qty 50

## 2022-02-07 MED ORDER — FUROSEMIDE 20 MG PO TABS: ORAL_TABLET | ORAL | 3 refills | Status: DC

## 2022-02-07 NOTE — Progress Notes (Signed)
      [address]       [place] [address]             [phone]        Raiford Simmonds. [facility] Medical Record #[id number] Date of Birth: 1940-09-07  Referring: Lorre Nick, MD Primary Care: [name], MD Primary Cardiologist:Jonathan Allyson Sabal, MD  Reason for visit:   follow-up  History of Present Illness:     81 year old male status post left VATS Pleurx catheter placement for a malignant effusion.  He has undergone 2 cycles of chemotherapy and has noticed a reduction and the drainage.  Physical Exam: BP 107/60   Pulse 72   Resp 20   Ht 5\' 9"  (1.753 m)   Wt 139 lb (63 kg)   SpO2 90% Comment: 4L O2 per [place]  BMI 20.53 kg/m   Alert NAD Incision clean.  PleurX site clear.  150 mL drained.  Serous fluid.    Diagnostic Studies & Laboratory data: CXR: 1. No significant change from the prior chest radiograph of 01/22/2022. 2. Left-sided chest tube/surgical drain with tip projecting at the level of the left lung apex. 3. Persistent dense opacity throughout the left upper and mid lung field. 4. As before, there is relatively better aeration of the left lung base, although with irregular opacities also present at this site. These opacities may reflect atelectasis and/or consolidation. 5. At least small residual left pleural effusion. 6. Aortic Atherosclerosis (ICD10-I70.0) and Emphysema (ICD10-J43.9).      Assessment / Plan:   81 year old male status post left VATS pleural biopsy for small cell lung cancer.  A Pleurx catheter was placed.  He has had decreasing drainage since starting his chemotherapy.  We will continue to drain this on a daily basis until it is under 200 mL on 2 consecutive therapies.  I will follow-up with him in 1 month virtually.   Corliss Skains 02/07/2022 1:02 PM

## 2022-02-08 ENCOUNTER — Ambulatory Visit: Payer: Medicare Other

## 2022-02-08 DIAGNOSIS — J910 Malignant pleural effusion: Secondary | ICD-10-CM | POA: Diagnosis not present

## 2022-02-08 DIAGNOSIS — C3490 Malignant neoplasm of unspecified part of unspecified bronchus or lung: Secondary | ICD-10-CM | POA: Diagnosis not present

## 2022-02-08 DIAGNOSIS — J9601 Acute respiratory failure with hypoxia: Secondary | ICD-10-CM | POA: Diagnosis not present

## 2022-02-08 DIAGNOSIS — Z48813 Encounter for surgical aftercare following surgery on the respiratory system: Secondary | ICD-10-CM | POA: Diagnosis not present

## 2022-02-08 DIAGNOSIS — Z4801 Encounter for change or removal of surgical wound dressing: Secondary | ICD-10-CM | POA: Diagnosis not present

## 2022-02-08 DIAGNOSIS — J942 Hemothorax: Secondary | ICD-10-CM | POA: Diagnosis not present

## 2022-02-08 DIAGNOSIS — K5900 Constipation, unspecified: Secondary | ICD-10-CM | POA: Diagnosis not present

## 2022-02-08 LAB — ACID FAST CULTURE WITH REFLEXED SENSITIVITIES (MYCOBACTERIA): Acid Fast Culture: NEGATIVE

## 2022-02-09 ENCOUNTER — Ambulatory Visit (HOSPITAL_COMMUNITY)
Admission: RE | Admit: 2022-02-09 | Discharge: 2022-02-09 | Disposition: A | Discharge status: Home / Self Care | Payer: Medicare Other | Source: Ambulatory Visit | Attending: Internal Medicine | Admitting: Internal Medicine

## 2022-02-09 DIAGNOSIS — J439 Emphysema, unspecified: Secondary | ICD-10-CM | POA: Diagnosis not present

## 2022-02-09 DIAGNOSIS — R911 Solitary pulmonary nodule: Secondary | ICD-10-CM | POA: Insufficient documentation

## 2022-02-09 DIAGNOSIS — C3490 Malignant neoplasm of unspecified part of unspecified bronchus or lung: Secondary | ICD-10-CM | POA: Diagnosis not present

## 2022-02-09 DIAGNOSIS — K5730 Diverticulosis of large intestine without perforation or abscess without bleeding: Secondary | ICD-10-CM | POA: Insufficient documentation

## 2022-02-09 DIAGNOSIS — C801 Malignant (primary) neoplasm, unspecified: Secondary | ICD-10-CM | POA: Insufficient documentation

## 2022-02-09 DIAGNOSIS — K769 Liver disease, unspecified: Secondary | ICD-10-CM | POA: Insufficient documentation

## 2022-02-09 DIAGNOSIS — I700 Atherosclerosis of aorta: Secondary | ICD-10-CM | POA: Diagnosis not present

## 2022-02-09 DIAGNOSIS — R590 Localized enlarged lymph nodes: Secondary | ICD-10-CM | POA: Diagnosis not present

## 2022-02-09 DIAGNOSIS — J321 Chronic frontal sinusitis: Secondary | ICD-10-CM | POA: Diagnosis not present

## 2022-02-09 LAB — URINE CULTURE: Culture: <10000 — AB

## 2022-02-09 MED ORDER — GADOBUTROL 1 MMOL/ML IV SOLN
6.0000 mL | Freq: Once | INTRAVENOUS | Status: AC | PRN
  Administered 2022-02-09: 6 mL via INTRAVENOUS

## 2022-02-10 ENCOUNTER — Telehealth: Payer: Self-pay | Admitting: Medical Oncology

## 2022-02-10 ENCOUNTER — Encounter (HOSPITAL_COMMUNITY)
Admission: RE | Admit: 2022-02-10 | Discharge: 2022-02-10 | Disposition: A | Discharge status: Home / Self Care | Payer: Medicare Other | Source: Ambulatory Visit | Attending: Internal Medicine | Admitting: Internal Medicine

## 2022-02-10 DIAGNOSIS — C801 Malignant (primary) neoplasm, unspecified: Secondary | ICD-10-CM

## 2022-02-10 DIAGNOSIS — C3490 Malignant neoplasm of unspecified part of unspecified bronchus or lung: Secondary | ICD-10-CM

## 2022-02-10 LAB — GLUCOSE, CAPILLARY: Glucose-Capillary: 98 mg/dL (ref 70–99)

## 2022-02-10 MED ORDER — FLUDEOXYGLUCOSE F - 18 (FDG) INJECTION
6.9000 | Freq: Once | INTRAVENOUS | Status: AC
  Administered 2022-02-10: 6.9 via INTRAVENOUS

## 2022-02-11 ENCOUNTER — Telehealth: Payer: Self-pay | Admitting: Medical Oncology

## 2022-02-11 ENCOUNTER — Inpatient Hospital Stay: Payer: Medicare Other

## 2022-02-11 ENCOUNTER — Other Ambulatory Visit: Payer: Self-pay | Admitting: Physician Assistant

## 2022-02-11 ENCOUNTER — Inpatient Hospital Stay (HOSPITAL_BASED_OUTPATIENT_CLINIC_OR_DEPARTMENT_OTHER): Payer: Medicare Other | Admitting: Internal Medicine

## 2022-02-11 ENCOUNTER — Encounter: Payer: Self-pay | Admitting: *Deleted

## 2022-02-11 ENCOUNTER — Other Ambulatory Visit: Payer: Self-pay

## 2022-02-11 VITALS — BP 99/63 | HR 62 | Temp 97.6°F | Resp 16 | Wt 128.2 lb

## 2022-02-11 DIAGNOSIS — K5900 Constipation, unspecified: Secondary | ICD-10-CM | POA: Diagnosis not present

## 2022-02-11 DIAGNOSIS — Z5112 Encounter for antineoplastic immunotherapy: Secondary | ICD-10-CM

## 2022-02-11 DIAGNOSIS — Z5111 Encounter for antineoplastic chemotherapy: Secondary | ICD-10-CM

## 2022-02-11 DIAGNOSIS — Z48813 Encounter for surgical aftercare following surgery on the respiratory system: Secondary | ICD-10-CM | POA: Diagnosis not present

## 2022-02-11 DIAGNOSIS — C3482 Malignant neoplasm of overlapping sites of left bronchus and lung: Secondary | ICD-10-CM

## 2022-02-11 DIAGNOSIS — K6289 Other specified diseases of anus and rectum: Secondary | ICD-10-CM

## 2022-02-11 DIAGNOSIS — C3412 Malignant neoplasm of upper lobe, left bronchus or lung: Secondary | ICD-10-CM | POA: Diagnosis not present

## 2022-02-11 DIAGNOSIS — J910 Malignant pleural effusion: Secondary | ICD-10-CM | POA: Diagnosis not present

## 2022-02-11 DIAGNOSIS — J9601 Acute respiratory failure with hypoxia: Secondary | ICD-10-CM | POA: Diagnosis not present

## 2022-02-11 DIAGNOSIS — M7989 Other specified soft tissue disorders: Secondary | ICD-10-CM | POA: Diagnosis not present

## 2022-02-11 DIAGNOSIS — Z4801 Encounter for change or removal of surgical wound dressing: Secondary | ICD-10-CM | POA: Diagnosis not present

## 2022-02-11 DIAGNOSIS — J942 Hemothorax: Secondary | ICD-10-CM | POA: Diagnosis not present

## 2022-02-11 DIAGNOSIS — C3490 Malignant neoplasm of unspecified part of unspecified bronchus or lung: Secondary | ICD-10-CM | POA: Diagnosis not present

## 2022-02-11 LAB — CBC WITH DIFFERENTIAL (CANCER CENTER ONLY)
Abs Immature Granulocytes: 0.15 10*3/uL — ABNORMAL HIGH (ref 0.00–0.07)
Basophils Absolute: 0 10*3/uL (ref 0.0–0.1)
Basophils Relative: 0 %
Eosinophils Absolute: 0.1 10*3/uL (ref 0.0–0.5)
Eosinophils Relative: 2 %
HCT: 37.1 % — ABNORMAL LOW (ref 39.0–52.0)
Hemoglobin: 12 g/dL — ABNORMAL LOW (ref 13.0–17.0)
Immature Granulocytes: 2 %
Lymphocytes Relative: 19 %
Lymphs Abs: 1.2 10*3/uL (ref 0.7–4.0)
MCH: 28.8 pg (ref 26.0–34.0)
MCHC: 32.3 g/dL (ref 30.0–36.0)
MCV: 89 fL (ref 80.0–100.0)
Monocytes Absolute: 0.1 10*3/uL (ref 0.1–1.0)
Monocytes Relative: 1 %
Neutro Abs: 4.7 10*3/uL (ref 1.7–7.7)
Neutrophils Relative %: 76 %
Platelet Count: 301 10*3/uL (ref 150–400)
RBC: 4.17 MIL/uL — ABNORMAL LOW (ref 4.22–5.81)
RDW: 13.5 % (ref 11.5–15.5)
WBC Count: 6.3 10*3/uL (ref 4.0–10.5)
nRBC: 0 % (ref 0.0–0.2)

## 2022-02-11 LAB — CMP (CANCER CENTER ONLY)
ALT: 23 U/L (ref 0–44)
AST: 22 U/L (ref 15–41)
Albumin: 2.5 g/dL — ABNORMAL LOW (ref 3.5–5.0)
Alkaline Phosphatase: 82 U/L (ref 38–126)
Anion gap: 4 — ABNORMAL LOW (ref 5–15)
BUN: 14 mg/dL (ref 8–23)
CO2: 32 mmol/L (ref 22–32)
Calcium: 8.3 mg/dL — ABNORMAL LOW (ref 8.9–10.3)
Chloride: 101 mmol/L (ref 98–111)
Creatinine: 0.76 mg/dL (ref 0.61–1.24)
GFR, Estimated: >60 mL/min (ref >60)
Glucose, Bld: 98 mg/dL (ref 70–99)
Potassium: 4.1 mmol/L (ref 3.5–5.1)
Sodium: 137 mmol/L (ref 135–145)
Total Bilirubin: 0.3 mg/dL (ref 0.3–1.2)
Total Protein: 5.2 g/dL — ABNORMAL LOW (ref 6.5–8.1)

## 2022-02-11 MED ORDER — NYSTATIN 100000 UNIT/GM EX POWD: 1.0000 | Freq: Two times a day (BID) | CUTANEOUS | 0 refills | Status: DC

## 2022-02-11 NOTE — Progress Notes (Signed)
I followed up on Pedro Pope treatment plan and scheduled per Dr. Arbutus Ped. He is scheduled for labs next week and in two weeks labs, follow up with Dr. Arbutus Ped and treatment at this time.

## 2022-02-11 NOTE — Progress Notes (Signed)
[facility] Telephone:([phone]) [phone]   [phone]  OFFICE PROGRESS NOTE  Pedro Corn, Pedro Pope [address] [place] [address]  DIAGNOSIS: Extensive stage (T3, N2, M1 a) small cell lung cancer presented with obstructive left upper lobe lung mass in addition to mediastinal lymphadenopathy and malignant left pleural effusion as well as pleural-based metastasis as well as suspicious liver metastasis diagnosed in June 2023.  PRIOR THERAPY: None  CURRENT THERAPY: Palliative systemic chemotherapy with carboplatin initiated for AUC of 4 on day 1 and etoposide 80 Mg/M2 on days 1, 2 and 3 with Cosela before the chemotherapy and Imfinzi 1500 Mg IV on day 1 every 3 weeks.  Status post 1 cycle.  We will plan to increase his dose of etoposide to 100 Mg/M2 starting cycle #2 once the patient has some improvement in his condition.  INTERVAL HISTORY: Pedro Pope. 81 y.o. male returns to the clinic today for follow-up visit accompanied by his brother-in-law.  The patient is feeling fine today with no concerning complaints except for the persistent baseline fatigue and shortness of breath and he is currently on home oxygen.  The drainage from the left pleural effusion has been decreased to every other day now because of the lower amount of the drain fluid.  He also has constipation that has been going on for around 10 days.  He tried to several over-the-counter medication with no improvement.  He was seen at the emergency department and intervention did not help and he was discharged home.  He has no current chest pain but has mild cough with no hemoptysis.  He has no nausea, vomiting, diarrhea or abdominal pain.  He has no fever or chills.  He had a PET scan and MRI of the brain performed recently and he is here for evaluation and discussion of his scan results.  MEDICAL HISTORY: Past Medical History:  Diagnosis Date   Anosmia    CAD (coronary artery disease), native coronary  artery    a. 10/29/2013 antlat STEMI s/p DES to pLAD; residual 80 % proximal and 60% mid LCx diease   Chronic headaches    Dressler's syndrome (HCC)    a. placed on colchicine    ED (erectile dysfunction)    Elevated TSH    Fatty liver    Gout    Ischemic cardiomyopathy    a. 2D ECHO 11/04/14 w/ EF 40-45% with LV WMA, G2DD, mild MR, mild LA dilation, mod TR.   PAF (paroxysmal atrial fibrillation) (HCC)    a. Dx on 11/03/14 admission. Not placed on longterm AC due to DAPT w/ receent DES. Will plan for outpt heart monitor    Pre-diabetes    a. HgA1c 6.1 in 10/2014   Prostate cancer [facility])    s/p prostatectomy and XRT   Rheumatic fever 1947    ALLERGIES:  is allergic to penicillins, Pope syrup [glucose], and aspirin adult low [aspirin].  MEDICATIONS:  Current Outpatient Medications  Medication Sig Dispense Refill   acetaminophen (TYLENOL) 500 MG tablet Take 500 mg by mouth every 6 (six) hours as needed for mild pain or moderate pain.     albuterol (VENTOLIN HFA) 108 (90 Base) MCG/ACT inhaler Inhale 2 puffs into the lungs every 4 (four) hours as needed for shortness of breath.     ascorbic acid (VITAMIN C) 500 MG tablet Take 500 mg by mouth daily.     Blood Pressure Monitor KIT For daily blood pressure monitoring;  Arm  Cuff 1 each 0   carvedilol (COREG) 3.125 MG tablet TAKE 1 TABLET BY MOUTH TWICE A DAY WITH MEALS (Patient taking differently: Take 3.125 mg by mouth daily. Take 1 tablet by mouth daily) 180 tablet 3   Cholecalciferol (VITAMIN D3) 25 MCG (1000 UT) CAPS Take 1 capsule by mouth daily.     colchicine 0.6 MG tablet Take 1 tablet (0.6 mg total) by mouth daily. (Patient taking differently: Take 0.6 mg by mouth daily as needed (Gout).) 30 tablet 3   furosemide (LASIX) 20 MG tablet Take 20 mg daily as needed only for weight gain or swelling. Hold if systolic blood pressure is less than 95. 30 tablet 3   gabapentin (NEURONTIN) 100 MG capsule TAKE ONE TO TWO CAPSULES BY MOUTH THREE TIMES  DAILY as directed Orally Three times a day for 30 days     nitroGLYCERIN (NITROSTAT) 0.4 MG SL tablet Place 1 tablet (0.4 mg total) under the tongue every 5 (five) minutes as needed for chest pain. 25 tablet 2   oxyCODONE (ROXICODONE) 5 MG immediate release tablet Take 0.5-1 tablets (2.5-5 mg total) by mouth every 4 (four) hours as needed. 30 tablet 0   prochlorperazine (COMPAZINE) 10 MG tablet Take 1 tablet (10 mg total) by mouth every 6 (six) hours as needed for nausea or vomiting. 30 tablet 0   No current facility-administered medications for this visit.    SURGICAL HISTORY:  Past Surgical History:  Procedure Laterality Date   CORONARY ANGIOPLASTY WITH STENT PLACEMENT  10/30/14   STEMI s/p DES to proximal LAD; residual 80 % proximal and 60% mid LCx diease   LEFT HEART CATHETERIZATION WITH CORONARY ANGIOGRAM N/A 10/30/2014   Procedure: LEFT HEART CATHETERIZATION WITH CORONARY ANGIOGRAM;  Surgeon: Runell Gess, Pedro Pope;  Location: [facility] CATH LAB;  Service: Cardiovascular;  Laterality: N/A;   PERCUTANEOUS CORONARY STENT INTERVENTION (PCI-S)  10/30/2014   Procedure: PERCUTANEOUS CORONARY STENT INTERVENTION (PCI-S);  Surgeon: Runell Gess, Pedro Pope;  Location: [facility] CATH LAB;  Service: Cardiovascular;;   PROSTATECTOMY     THORACENTESIS Left 12/31/2021   Procedure: THORACENTESIS;  Surgeon: Lorin Glass, Pedro Pope;  Location: [facility] ENDOSCOPY;  Service: Pulmonary;  Laterality: Left;   TONSILLECTOMY     VIDEO ASSISTED THORACOSCOPY (VATS)/DECORTICATION Left 01/21/2022   Procedure: VIDEO ASSISTED THORACOSCOPY (VATS)/DECORTICATION;  Surgeon: Corliss Skains, Pedro Pope;  Location: [facility] OR;  Service: Thoracic;  Laterality: Left;    REVIEW OF SYSTEMS:  Constitutional: positive for anorexia, fatigue, and weight loss Eyes: negative Ears, nose, mouth, throat, and face: negative Respiratory: positive for cough and dyspnea on exertion Cardiovascular: negative Gastrointestinal: positive for  constipation Genitourinary:negative Integument/breast: negative Hematologic/lymphatic: negative Musculoskeletal:positive for muscle weakness Neurological: negative Behavioral/Psych: negative Endocrine: negative Allergic/Immunologic: negative   PHYSICAL EXAMINATION: General appearance: alert, cooperative, fatigued, and no distress Head: Normocephalic, without obvious abnormality, atraumatic Neck: no adenopathy, no JVD, supple, symmetrical, trachea midline, and thyroid not enlarged, symmetric, no tenderness/mass/nodules Lymph nodes: Cervical, supraclavicular, and axillary nodes normal. Resp: diminished breath sounds LLL and dullness to percussion LLL Back: symmetric, no curvature. ROM normal. No CVA tenderness. Cardio: regular rate and rhythm, S1, S2 normal, no murmur, click, rub or gallop GI: soft, non-tender; bowel sounds normal; no masses,  no organomegaly Extremities: extremities normal, atraumatic, no cyanosis or edema Neurologic: Alert and oriented X 3, normal strength and tone. Normal symmetric reflexes. Normal coordination and gait  ECOG PERFORMANCE STATUS: 1 - Symptomatic but completely ambulatory  Blood pressure 99/63, pulse 62, temperature 97.6 F (36.4 C),  temperature source Oral, resp. rate 16, weight 128 lb 3.2 oz (58.2 kg), SpO2 100 %.  LABORATORY DATA: Lab Results  Component Value Date   WBC 6.3 02/11/2022   HGB 12.0 (L) 02/11/2022   HCT 37.1 (L) 02/11/2022   MCV 89.0 02/11/2022   PLT 301 02/11/2022      Chemistry      Component Value Date/Time   NA 135 02/07/2022 2031   K 4.8 02/07/2022 2031   CL 102 02/07/2022 2031   CO2 26 02/07/2022 2022   BUN 30 (H) 02/07/2022 2031   CREATININE 1.00 02/07/2022 2031   CREATININE 0.95 02/04/2022 0743      Component Value Date/Time   CALCIUM 8.9 02/07/2022 2022   ALKPHOS 72 02/07/2022 2022   AST 34 02/07/2022 2022   AST 34 02/04/2022 0743   ALT 39 02/07/2022 2022   ALT 34 02/04/2022 0743   BILITOT 0.5 02/07/2022  2022   BILITOT 0.4 02/04/2022 0743       RADIOGRAPHIC STUDIES: NM PET Image Initial (PI) Skull Base To Thigh  Result Date: 02/10/2022 CLINICAL DATA:  Initial treatment strategy for small cell lung cancer staging. EXAM: NUCLEAR MEDICINE PET SKULL BASE TO THIGH TECHNIQUE: 6.9 mCi F-18 FDG was injected intravenously. Full-ring PET imaging was performed from the skull base to thigh after the radiotracer. CT data was obtained and used for attenuation correction and anatomic localization. Fasting blood glucose: 98 mg/dl COMPARISON:  Abdomen pelvis CT February 07, 2022. And recent CTs of the chest and abdomen and pelvis from earlier in June. FINDINGS: Mediastinal blood pool activity: SUV max 1.99 Liver activity: SUV max NA NECK: No hypermetabolic lymph nodes in the neck. Incidental CT findings: none CHEST: Extensive circumferential pleural nodularity without substantial change based on comparison with abdominal imaging acquired on February 07, 2022 with respect to the lung bases. Improving appearance of the chest compared to the January 20, 2022 evaluation with improved aeration of LEFT upper lobe since this time following PleurX catheter placement. Extensive hypermetabolic disease about the LEFT chest in a circumferential distribution (image 44/4) 1.8 cm extrapleural nodule maximum SUV 3.7. (Image 67/4) soft tissue nodularity along the pleural surface tracking along the major fissure in the LEFT chest with a maximum SUV of 9.4, difficult to measure given adjacent consolidative changes, pleural fluid and interstitial thickening. Increased metabolic activity along the medial LEFT costodiaphragmatic sulcus with a maximum SUV of 7.2 and nodular circumferential increased metabolic activity along the inferior chest with areas of loculated pleural fluid not substantially changed in volume compared to recent CT imaging of the abdomen and pelvis with respect to the lung bases. AP window adenopathy and LEFT paratracheal adenopathy  largest 16 mm (image 65/4) previously 18 mm on the recent chest CT. Maximum SUV of 2.54. No frankly hypermetabolic lymph nodes but with similar pattern of mild nodal enlargement slightly improved from the chest CT acquired on January 20, 2022. Diffuse septal thickening in areas of previously collapsed lung in the LEFT upper lobe. Incidental CT findings: Calcified coronary artery disease, three-vessel disease. Heart size moderately enlarged. Aorta is not aneurysmal. Loculated pleural fluid in the anterior LEFT chest (image 76/4) 7.2 x 5.7 cm. Again with improved aeration in the LEFT upper lobe following placement of a PleurX catheter. No pneumothorax. Patchy atelectasis at the RIGHT lung base. ABDOMEN/PELVIS: LEFT retroperitoneal soft tissue adjacent to LEFT renal artery adjacent to LEFT diaphragmatic crus (image 117/4) maximum SUV of 2.28 not changed from recent imaging in terms of its  size. Low-attenuation lesion in posterior RIGHT hepatic lobe compatible with suspected metastatic lesion appears larger than on the recent CT of February 07, 2022 and is of similar size when compared to the exam of January 17, 2022. This measures 12 mm and shows no increased metabolic activity above background liver uptake. Incidental CT findings: No acute findings relative to gallbladder, pancreas, spleen, adrenal glands or kidneys. Signs of nephrolithiasis on the RIGHT with an interpolar calculus measuring approximately 7 mm. No acute gastrointestinal findings. Colonic diverticulosis. Post prostatectomy. Aortic atherosclerosis. No adenopathy by size criteria in the abdomen or in the pelvis. SKELETON: No focal hypermetabolic activity to suggest skeletal metastasis. Incidental CT findings: none IMPRESSION: Extensive pleural and parenchymal involvement in the LEFT chest with improvement based on comparison with imaging from earlier in June. Areas of greatest FDG uptake along the peripheral LEFT chest. Despite improved aeration of the LEFT upper  chest there is extensive consolidation and there are areas of septal thickening. Background lymphangitic tumor is considered. Would also correlate with any respiratory symptoms that would suggest ongoing infection though findings are nonspecific. Mediastinal adenopathy with only mild increased metabolic activity remains concerning, this will serve as a baseline for future follow-up. Abdominal disease adjacent to the LEFT diaphragmatic crus and in the liver also without substantial metabolic activity but warranting attention on subsequent imaging. Note that the liver lesion appears larger on the current study when compared to the recent contrasted evaluation and is of similar size when compared to the study of January 17, 2022, significance is uncertain. Post prostatectomy. Aortic Atherosclerosis (ICD10-I70.0) and Emphysema (ICD10-J43.9). Electronically Signed   By: Donzetta Kohut M.D.   On: 02/10/2022 14:25   MR BRAIN W WO CONTRAST  Result Date: 02/10/2022 CLINICAL DATA:  Staging examination for small cell lung cancer EXAM: MRI HEAD WITHOUT AND WITH CONTRAST TECHNIQUE: Multiplanar, multiecho pulse sequences of the brain and surrounding structures were obtained without and with intravenous contrast. CONTRAST:  6mL GADAVIST GADOBUTROL 1 MMOL/ML IV SOLN COMPARISON:  None FINDINGS: Brain: Diffusion imaging does not show any acute or subacute infarction. There is generalized brain volume loss. There is no evidence metastatic disease. No hemorrhage, hydrocephalus or extra-axial collection. Vascular: Major vessels at the base of the brain show flow. Skull and upper cervical spine: Negative Sinuses/Orbits: There are some chronic inflammatory changes of the right frontal sinus. Orbits negative. Other: None IMPRESSION: No evidence of metastatic disease.  Age related volume loss. Electronically Signed   By: Paulina Fusi M.D.   On: 02/10/2022 11:53   CT ABDOMEN PELVIS W CONTRAST  Result Date: 02/07/2022 CLINICAL DATA:   Metastatic small cell lung cancer.  Constipation. EXAM: CT ABDOMEN AND PELVIS WITH CONTRAST TECHNIQUE: Multidetector CT imaging of the abdomen and pelvis was performed using the standard protocol following bolus administration of intravenous contrast. RADIATION DOSE REDUCTION: This exam was performed according to the departmental dose-optimization program which includes automated exposure control, adjustment of the mA and/or kV according to patient size and/or use of iterative reconstruction technique. CONTRAST:  OMNIPAQUE IOHEXOL 300 MG/ML  SOLN COMPARISON:  CT scan 01/17/2022 FINDINGS: Lower chest: Complex loculated pleural fluid collection at the left lung base with extensive pleural tumor. A PleurX drainage catheter is in place. Hepatobiliary: Interval decrease in size of the segment 7 hepatic lesion. Measures approximately 5 mm and previously measured 12 mm. No other lesions are identified. The gallbladder is unremarkable. No common bile duct dilatation. Pancreas: No mass, inflammation or ductal dilatation. Spleen: Normal size.  No focal lesions. Adrenals/Urinary Tract: Adrenal glands kidneys are unremarkable. Stable right renal calculus. The bladder is moderately distended. Stomach/Bowel: The stomach, duodenum, small bowel and colon are grossly normal. No acute inflammatory process, mass lesions or obstructive findings. Moderate to large stool burden noted along with stool in the rectum the suggesting constipation. Vascular/Lymphatic: Stable atherosclerotic calcifications involving the aorta and branch vessels. Stable extensive abdominal lymphadenopathy. Reproductive: The prostate gland seminal vesicles are surgically absent. Other: No ascites. Musculoskeletal: No significant bony findings. IMPRESSION: 1. Interval decrease in size of the segment 7 hepatic lesion. 2. Stable extensive upper abdominal lymphadenopathy. 3. Moderate to large stool burden along with stool in the rectum suggesting constipation.  4. Stable right renal calculus. 5. Complex loculated pleural fluid collection at the left lung base with extensive pleural tumor. A PleurX drainage catheter is in place. Aortic Atherosclerosis (ICD10-I70.0). Electronically Signed   By: Rudie Meyer M.D.   On: 02/07/2022 21:36   DG Abdomen 1 View  Result Date: 02/07/2022 CLINICAL DATA:  Constipation EXAM: ABDOMEN - 1 VIEW COMPARISON:  CT done on 01/17/2022 FINDINGS: Bowel gas pattern is nonspecific. There is no small bowel dilation. Moderate amount of stool is seen in the colon. There is no fecal impaction in the rectosigmoid. Left pleural effusion is seen. Possible left chest tube is noted. Surgical clips are seen in the pelvis. IMPRESSION: Nonspecific bowel gas pattern. Moderate stool burden in the ascending, transverse and descending colon. There is no fecal impaction in the rectosigmoid. Electronically Signed   By: Ernie Avena M.D.   On: 02/07/2022 19:51   DG Chest 2 View  Result Date: 02/07/2022 CLINICAL DATA:  Provided history: Small-cell lung cancer, overlapping sites of left lung. Additional history provided: VATS/decortication surgery 01/21/2022. EXAM: CHEST - 2 VIEW COMPARISON:  Chest radiographs 01/22/2022 and earlier. Chest CT 01/20/2022 FINDINGS: Unchanged position of a chest tube/surgical drain, with tip projecting at the level of the left lung apex. The cardiomediastinal silhouette is stable. Aortic atherosclerosis. Persistent dense opacity throughout the left upper to mid lung field. As before, there is relatively better aeration of the left lung base, although with irregular opacities also present at this site. At least small residual left pleural effusion. No appreciable airspace consolidation within the right lung. Background emphysema. No evidence of pneumothorax. No acute bony abnormality identified. Degenerative changes of the spine. IMPRESSION: 1. No significant change from the prior chest radiograph of 01/22/2022. 2.  Left-sided chest tube/surgical drain with tip projecting at the level of the left lung apex. 3. Persistent dense opacity throughout the left upper and mid lung field. 4. As before, there is relatively better aeration of the left lung base, although with irregular opacities also present at this site. These opacities may reflect atelectasis and/or consolidation. 5. At least small residual left pleural effusion. 6. Aortic Atherosclerosis (ICD10-I70.0) and Emphysema (ICD10-J43.9). Electronically Signed   By: Jackey Loge D.O.   On: 02/07/2022 10:53   DG CHEST PORT 1 VIEW  Result Date: 01/22/2022 CLINICAL DATA:  Ischemic cardiomyopathy.  Postoperative check. EXAM: PORTABLE CHEST 1 VIEW COMPARISON:  January 17, 2022 FINDINGS: The heart size and mediastinal contours are stable. Dense consolidation of the left lung is identified with interval slight better aeration of the left lung base compared prior exam. A left chest tube is identified with the distal tip in the left apex. Subcutaneous emphysema is identified over the left chest. The right lung demonstrate emphysematous changes. The visualized skeletal structures are stable. IMPRESSION: Dense consolidation of  the left lung with interval slight better aeration of the left lung base compared prior exam. Left chest tube is identified with the distal tip in the left apex. Electronically Signed   By: Sherian Rein M.D.   On: 01/22/2022 07:44   CT CHEST W CONTRAST  Result Date: 01/20/2022 CLINICAL DATA:  Large left pleural effusion. EXAM: CT CHEST WITH CONTRAST TECHNIQUE: Multidetector CT imaging of the chest was performed during intravenous contrast administration. RADIATION DOSE REDUCTION: This exam was performed according to the departmental dose-optimization program which includes automated exposure control, adjustment of the mA and/or kV according to patient size and/or use of iterative reconstruction technique. CONTRAST:  OMNIPAQUE IOHEXOL 300 MG/ML  SOLN  COMPARISON:  Recent chest CTs from May 8 and Jan 15, 2022 FINDINGS: Cardiovascular: The heart is normal in size. No pericardial effusion. The aorta is normal in caliber. Stable atherosclerotic calcifications. The pulmonary arteries are patent. Stable three-vessel coronary artery calcifications. Mediastinum/Nodes: Stable mediastinal lymphadenopathy. 2.5 cm AP window node on image number 63/3. Left hilar nodes versus hilar pleural tumor. The more lateral lesion measures 21 mm and the more medial lesion measures 13 mm. The esophagus is grossly normal. Lungs/Pleura: Very large left pleural effusion increased in size since the prior CT scan. Essentially drowned/obstructed left lung. Extensive enhancing pleural tumor involving the left hemithorax. The heart and mediastinum are displaced to the right because of the large left hydrothorax. The right lung demonstrates advanced emphysematous changes and pulmonary scarring but no pulmonary nodules or right-sided pleural tumor. Upper Abdomen: No significant upper abdominal findings. Advanced vascular calcifications. Musculoskeletal: No significant bony findings. IMPRESSION: 1. Very large left pleural effusion increased in size since the prior CT scan. 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 2. Essentially drowned/obstructed left lung. 3. Extensive enhancing left pleural tumor and mediastinal adenopathy. 4. Advanced emphysematous changes and pulmonary scarring in the right lung but no pulmonary nodules or right-sided pleural tumor. 5. Stable advanced vascular calcifications. Aortic Atherosclerosis (ICD10-I70.0) and Emphysema (ICD10-J43.9). Electronically Signed   By: Rudie Meyer M.D.   On: 01/20/2022 16:43   CT ABDOMEN PELVIS W CONTRAST  Result Date: 01/17/2022 CLINICAL DATA:  Pain left lower quadrant of abdomen, rectal bleeding EXAM: CT ABDOMEN AND PELVIS WITH CONTRAST TECHNIQUE: Multidetector CT imaging of the  abdomen and pelvis was performed using the standard protocol following bolus administration of intravenous contrast. RADIATION DOSE REDUCTION: This exam was performed according to the departmental dose-optimization program which includes automated exposure control, adjustment of the mA and/or kV according to patient size and/or use of iterative reconstruction technique. CONTRAST:  80mL OMNIPAQUE IOHEXOL 300 MG/ML  SOLN COMPARISON:  Previous CT chest done on 01/15/2022, chest radiographs done earlier today FINDINGS: Lower chest: Large left pleural effusion is noted. There are peripheral pleural nodules on the left side. There is almost complete atelectasis in the visualized left lower lung fields. There is increase in interstitial markings in the periphery of right lower lung fields possibly suggesting interstitial lung disease. There are scattered coronary artery calcifications. Dense calcification is seen in the region of mitral annulus. Hepatobiliary: In the image 14 of series 4, there is 1.3 cm low-density lesion in the right lobe of liver. There is no significant dilation of bile ducts. Gallbladder is not distended. Pancreas: No focal abnormality is seen. Spleen: There is medial displacement of spleen caused by large left pleural effusion. Adrenals/Urinary Tract: Adrenals are unremarkable. There is no hydronephrosis.  Left kidney is displaced medially. There is 7 mm calculus in the midportion of right kidney. Ureters are not dilated. Urinary bladder is unremarkable. Stomach/Bowel: Stomach is not distended. Small bowel loops are not dilated. Appendix is not dilated. Appendix is noted in the right pelvic cavity. There is no significant wall thickening in colon. There is no pericolic stranding. Vascular/Lymphatic: Extensive calcifications are seen in the abdominal aorta and its major branches. Some of the coarse calcifications are noted projecting into the lumen of abdominal aorta. Reproductive: There is evidence of  previous surgical removal of prostate. There are multiple surgical clips in both sides of pelvis. Other: There is no ascites or pneumoperitoneum. Umbilical hernia containing fat is seen. Musculoskeletal: Degenerative changes are noted in both hips, more so on the right side. No definite focal lytic or sclerotic lesions are seen. IMPRESSION: Massive left pleural effusion. There are numerous nodules in the periphery of left pleural space suggesting malignant neoplastic process such as mesothelioma or pleural metastatic disease. There is compression atelectasis of visualized left lower lung fields. There is prominence of interstitial markings in the periphery of right lower lung fields suggesting scarring from interstitial lung disease. There is no evidence of intestinal obstruction or pneumoperitoneum. Appendix is not dilated. There is no hydronephrosis. There is 7 mm nonobstructing right renal stone. There is 13 mm low-density lesion in the right lobe of liver which is not fully evaluated. This may suggest hemangioma or malignant neoplastic process such as metastatic disease. Other findings as described in the body of the report. Electronically Signed   By: Ernie Avena M.D.   On: 01/17/2022 20:06   DG Chest 2 View  Result Date: 01/17/2022 CLINICAL DATA:  Worsening hypoxia. Known left pleural effusion recurrence EXAM: CHEST - 2 VIEW COMPARISON:  Chest CT 01/15/2022.  Radiograph 12/31/2021 FINDINGS: Large partially loculated left pleural effusion is not significantly changed from recent chest CT. Lateral component tracks towards the apex. There is underlying left lung opacities. Right lung emphysema without acute findings. Heart size is obscured by lung opacity. No pneumothorax. IMPRESSION: 1. Large partially loculated left pleural effusion, not significantly changed from CT 2 days ago. Underlying left lung opacities were better assessed on recent CT. 2. Emphysema.  No acute right lung findings.  Electronically Signed   By: Narda Rutherford M.D.   On: 01/17/2022 17:08   CT Chest Wo Contrast  Result Date: 01/17/2022 CLINICAL DATA:  Follow-up pneumonia, parapneumonic effusion * Tracking Code: BO * EXAM: CT CHEST WITHOUT CONTRAST TECHNIQUE: Multidetector CT imaging of the chest was performed following the standard protocol without IV contrast. RADIATION DOSE REDUCTION: This exam was performed according to the departmental dose-optimization program which includes automated exposure control, adjustment of the mA and/or kV according to patient size and/or use of iterative reconstruction technique. COMPARISON:  12/23/2021 FINDINGS: Cardiovascular: Aortic atherosclerosis. Aortic valve calcifications. Normal heart size. Subendocardial fibrofatty scarring of the left ventricular apex in keeping with prior infarction (series 2, image 117). Three-vessel coronary artery calcifications. No pericardial effusion. Mediastinum/Nodes: Interval enlargement of pretracheal, AP window, and left hilar lymph nodes, largest AP window node measuring 2.0 x 1.9 cm, previously 1.7 x 1.5 cm (series 2, image 66). Thyroid gland, trachea, and esophagus demonstrate no significant findings. Lungs/Pleura: Interval increase in volume of a left pleural effusion, now large, with extensive associated pleural thickening and nodularity throughout the left hemithorax (series 2, image 138). There is now essentially complete consolidation and or atelectasis of the left upper lobe sans lingula, with  proximal obstruction of the left upper lobe bronchus (series 5, image 77). Moderate to severe underlying centrilobular and paraseptal emphysema. Mild pulmonary fibrosis of the aerated right lung, characterized by irregular peripheral interstitial opacity, septal thickening, and areas of subpleural bronchiolectasis at the right lung base. Upper Abdomen: No acute abnormality. Nonobstructive right nephrolithiasis. Musculoskeletal: No chest wall abnormality.  No suspicious osseous lesions identified. IMPRESSION: 1. Interval increase in volume of a left pleural effusion, now large, with extensive associated pleural thickening and nodularity throughout the left hemithorax. There is now essentially complete consolidation and or atelectasis of the left upper lobe sans lingula, with proximal obstruction of the left upper lobe bronchus. Findings are most consistent with underlying primary lung malignancy and associated pleural metastatic disease. 2. Interval enlargement of pretracheal, AP window, and left hilar lymph nodes, which may be reactive but are highly suspicious for nodal metastatic disease. 3. Underlying emphysema. 4. Mild pulmonary fibrosis of the aerated right lung, characterized by irregular peripheral interstitial opacity, septal thickening, and areas of subpleural bronchiolectasis at the right lung base. Although not optimally characterized by this non tailored examination, findings are categorized as probable UIP per consensus guidelines: Diagnosis of Idiopathic Pulmonary Fibrosis: An Official ATS/ERS/JRS/ALAT Clinical Practice Guideline. Am Rosezetta Schlatter Crit Care Med Vol 198, Iss 5, [id number], Apr 18 2017. 5. Coronary artery disease. 6. Aortic valve calcifications. Correlate for echocardiographic evidence of aortic valve dysfunction. 7. Nonobstructive right nephrolithiasis. These results will be called to the ordering clinician or representative by the Radiologist Assistant, and communication documented in the PACS or [employer]. Aortic Atherosclerosis (ICD10-I70.0) and Emphysema (ICD10-J43.9). Electronically Signed   By: Jearld Lesch M.D.   On: 01/17/2022 09:00    ASSESSMENT AND PLAN: This is a very pleasant 81 years old white male with Extensive stage (T3, N2, M1 a) small cell lung cancer presented with obstructive left upper lobe lung mass in addition to mediastinal lymphadenopathy and malignant left pleural effusion as well as pleural-based metastasis  and suspicious liver metastasis diagnosed in June 2023. The patient is currently undergoing systemic chemotherapy with carboplatin for AUC of 4 on day 1, etoposide 80 Mg/M2 on days 1, 2 and 3 with Imfinzi 240 Mg/M2 on the days before chemotherapy and Imfinzi 1500 Mg IV on day 1 every 3 weeks.  Status post 1 cycle.  Once the patient condition improved, we will consider increasing his dose of carboplatin and etoposide to the standard dose. The patient tolerated the first week of his treatment fairly well except for mild fatigue. He had a PET scan and MRI brain performed recently.  I personally and independently reviewed the scan images and discussed the result with the patient today.  His MRI of the brain showed no evidence of metastatic disease to the brain. I recommended for the patient to continue his current treatment with carboplatin, etoposide and Imfinzi in 2 weeks. For the constipation I recommended for the patient to use milk of magnesia half a bottle initially and if no improvement to complete the whole bottle.  He was also advised to continue on stool softener and MiraLAX on as-needed basis. For the pain management he is currently on oxycodone as well as gabapentin.  His pain is much better controlled and he would like to start weaning the gabapentin from 200 mg p.o. 3 times daily to 100 mg p.o. 3 times daily. For the recurrent left pleural effusion, he will continue drainage via the Pleurx catheter and he is currently doing it every other day.  I will see the patient back for follow-up visit in 2 weeks for evaluation before starting cycle #2. He was advised to call immediately if he has any other concerning symptoms in the interval. The patient voices understanding of current disease status and treatment options and is in agreement with the current care plan.  All questions were answered. The patient knows to call the clinic with any problems, questions or concerns. We can certainly see the  patient much sooner if necessary.  The total time spent in the appointment was 30 minutes.  Disclaimer: This note was dictated with voice recognition software. Similar sounding words can inadvertently be transcribed and may not be corrected upon review.

## 2022-02-11 NOTE — Telephone Encounter (Addendum)
Perineal skin redness and tiny bumps that sting. Per Pedro Pope pt instructed to keep area clean and dry. I told them to get Nystatin powder that Pedro Pope prescribed and use it twice a day . Call back tomorrow with an update on skin. Wife instructed to apply nystatin powder first and zinc oxide on top of it. Wife was instructed if pt has any worsening symptoms, fever , chills to call on call service and go to ED.

## 2022-02-12 ENCOUNTER — Other Ambulatory Visit: Payer: Medicare Other

## 2022-02-12 ENCOUNTER — Ambulatory Visit: Payer: Medicare Other | Admitting: Internal Medicine

## 2022-02-12 DIAGNOSIS — J9601 Acute respiratory failure with hypoxia: Secondary | ICD-10-CM | POA: Diagnosis not present

## 2022-02-12 DIAGNOSIS — Z4801 Encounter for change or removal of surgical wound dressing: Secondary | ICD-10-CM | POA: Diagnosis not present

## 2022-02-12 DIAGNOSIS — J910 Malignant pleural effusion: Secondary | ICD-10-CM | POA: Diagnosis not present

## 2022-02-12 DIAGNOSIS — Z48813 Encounter for surgical aftercare following surgery on the respiratory system: Secondary | ICD-10-CM | POA: Diagnosis not present

## 2022-02-12 DIAGNOSIS — J942 Hemothorax: Secondary | ICD-10-CM | POA: Diagnosis not present

## 2022-02-12 DIAGNOSIS — C3490 Malignant neoplasm of unspecified part of unspecified bronchus or lung: Secondary | ICD-10-CM | POA: Diagnosis not present

## 2022-02-13 DIAGNOSIS — Z4801 Encounter for change or removal of surgical wound dressing: Secondary | ICD-10-CM | POA: Diagnosis not present

## 2022-02-13 DIAGNOSIS — J942 Hemothorax: Secondary | ICD-10-CM | POA: Diagnosis not present

## 2022-02-13 DIAGNOSIS — C3490 Malignant neoplasm of unspecified part of unspecified bronchus or lung: Secondary | ICD-10-CM | POA: Diagnosis not present

## 2022-02-13 DIAGNOSIS — J910 Malignant pleural effusion: Secondary | ICD-10-CM | POA: Diagnosis not present

## 2022-02-13 DIAGNOSIS — J9601 Acute respiratory failure with hypoxia: Secondary | ICD-10-CM | POA: Diagnosis not present

## 2022-02-13 DIAGNOSIS — Z48813 Encounter for surgical aftercare following surgery on the respiratory system: Secondary | ICD-10-CM | POA: Diagnosis not present

## 2022-02-14 NOTE — Progress Notes (Signed)
Pharmacist Chemotherapy Monitoring - Initial Assessment    Anticipated start date: 02/24/22    The following has been reviewed per standard work regarding the patient's treatment regimen: The patient's diagnosis, treatment plan and drug doses, and organ/hematologic function Lab orders and baseline tests specific to treatment regimen  The treatment plan start date, drug sequencing, and pre-medications Prior authorization status  Patient's documented medication list, including drug-drug interaction screen and prescriptions for anti-emetics and supportive care specific to the treatment regimen The drug concentrations, fluid compatibility, administration routes, and timing of the medications to be used The patient's access for treatment and lifetime cumulative dose history, if applicable  The patient's medication allergies and previous infusion related reactions, if applicable   Changes made to treatment plan:  treatment plan date  Follow up needed:  N/A   Judge Stall, [facility], 02/14/2022  3:44 PM

## 2022-02-17 ENCOUNTER — Other Ambulatory Visit: Payer: Self-pay | Admitting: *Deleted

## 2022-02-17 DIAGNOSIS — J910 Malignant pleural effusion: Secondary | ICD-10-CM | POA: Diagnosis not present

## 2022-02-17 DIAGNOSIS — J90 Pleural effusion, not elsewhere classified: Secondary | ICD-10-CM

## 2022-02-17 DIAGNOSIS — C3490 Malignant neoplasm of unspecified part of unspecified bronchus or lung: Secondary | ICD-10-CM | POA: Diagnosis not present

## 2022-02-17 DIAGNOSIS — Z4801 Encounter for change or removal of surgical wound dressing: Secondary | ICD-10-CM | POA: Diagnosis not present

## 2022-02-17 DIAGNOSIS — J9601 Acute respiratory failure with hypoxia: Secondary | ICD-10-CM | POA: Diagnosis not present

## 2022-02-17 DIAGNOSIS — Z48813 Encounter for surgical aftercare following surgery on the respiratory system: Secondary | ICD-10-CM | POA: Diagnosis not present

## 2022-02-17 DIAGNOSIS — J942 Hemothorax: Secondary | ICD-10-CM | POA: Diagnosis not present

## 2022-02-19 ENCOUNTER — Other Ambulatory Visit: Payer: Self-pay

## 2022-02-19 ENCOUNTER — Encounter: Payer: Medicare Other | Admitting: Dietician

## 2022-02-19 ENCOUNTER — Inpatient Hospital Stay (HOSPITAL_BASED_OUTPATIENT_CLINIC_OR_DEPARTMENT_OTHER): Payer: Medicare Other | Admitting: Physician Assistant

## 2022-02-19 ENCOUNTER — Inpatient Hospital Stay: Payer: Medicare Other | Attending: Internal Medicine

## 2022-02-19 VITALS — BP 102/53 | HR 73 | Temp 98.0°F | Resp 18 | Wt 125.1 lb

## 2022-02-19 DIAGNOSIS — N200 Calculus of kidney: Secondary | ICD-10-CM | POA: Insufficient documentation

## 2022-02-19 DIAGNOSIS — K5900 Constipation, unspecified: Secondary | ICD-10-CM | POA: Diagnosis not present

## 2022-02-19 DIAGNOSIS — C3482 Malignant neoplasm of overlapping sites of left bronchus and lung: Secondary | ICD-10-CM

## 2022-02-19 DIAGNOSIS — Z9189 Other specified personal risk factors, not elsewhere classified: Secondary | ICD-10-CM | POA: Diagnosis not present

## 2022-02-19 DIAGNOSIS — C3490 Malignant neoplasm of unspecified part of unspecified bronchus or lung: Secondary | ICD-10-CM

## 2022-02-19 DIAGNOSIS — R300 Dysuria: Secondary | ICD-10-CM

## 2022-02-19 DIAGNOSIS — R410 Disorientation, unspecified: Secondary | ICD-10-CM

## 2022-02-19 DIAGNOSIS — E860 Dehydration: Secondary | ICD-10-CM | POA: Diagnosis not present

## 2022-02-19 DIAGNOSIS — J90 Pleural effusion, not elsewhere classified: Secondary | ICD-10-CM | POA: Insufficient documentation

## 2022-02-19 DIAGNOSIS — Z5112 Encounter for antineoplastic immunotherapy: Secondary | ICD-10-CM | POA: Diagnosis not present

## 2022-02-19 DIAGNOSIS — C782 Secondary malignant neoplasm of pleura: Secondary | ICD-10-CM | POA: Diagnosis not present

## 2022-02-19 DIAGNOSIS — Z79899 Other long term (current) drug therapy: Secondary | ICD-10-CM | POA: Insufficient documentation

## 2022-02-19 DIAGNOSIS — R399 Unspecified symptoms and signs involving the genitourinary system: Secondary | ICD-10-CM

## 2022-02-19 DIAGNOSIS — R4689 Other symptoms and signs involving appearance and behavior: Secondary | ICD-10-CM | POA: Insufficient documentation

## 2022-02-19 DIAGNOSIS — Z5111 Encounter for antineoplastic chemotherapy: Secondary | ICD-10-CM | POA: Diagnosis not present

## 2022-02-19 LAB — URINALYSIS, ROUTINE W REFLEX MICROSCOPIC
Bilirubin Urine: NEGATIVE
Glucose, UA: NEGATIVE mg/dL
Hgb urine dipstick: NEGATIVE
Ketones, ur: NEGATIVE mg/dL
Leukocytes,Ua: NEGATIVE
Nitrite: NEGATIVE
Protein, ur: NEGATIVE mg/dL
Specific Gravity, Urine: 1.008 (ref 1.005–1.030)
pH: 7 (ref 5.0–8.0)

## 2022-02-19 LAB — CBC WITH DIFFERENTIAL (CANCER CENTER ONLY)
Abs Immature Granulocytes: 0.01 10*3/uL (ref 0.00–0.07)
Basophils Absolute: 0 10*3/uL (ref 0.0–0.1)
Basophils Relative: 0 %
Eosinophils Absolute: 0 10*3/uL (ref 0.0–0.5)
Eosinophils Relative: 1 %
HCT: 36.7 % — ABNORMAL LOW (ref 39.0–52.0)
Hemoglobin: 11.9 g/dL — ABNORMAL LOW (ref 13.0–17.0)
Immature Granulocytes: 0 %
Lymphocytes Relative: 39 %
Lymphs Abs: 1 10*3/uL (ref 0.7–4.0)
MCH: 28.7 pg (ref 26.0–34.0)
MCHC: 32.4 g/dL (ref 30.0–36.0)
MCV: 88.4 fL (ref 80.0–100.0)
Monocytes Absolute: 0.4 10*3/uL (ref 0.1–1.0)
Monocytes Relative: 15 %
Neutro Abs: 1.1 10*3/uL — ABNORMAL LOW (ref 1.7–7.7)
Neutrophils Relative %: 45 %
Platelet Count: 170 10*3/uL (ref 150–400)
RBC: 4.15 MIL/uL — ABNORMAL LOW (ref 4.22–5.81)
RDW: 14.3 % (ref 11.5–15.5)
WBC Count: 2.6 10*3/uL — ABNORMAL LOW (ref 4.0–10.5)
nRBC: 0 % (ref 0.0–0.2)

## 2022-02-19 LAB — CMP (CANCER CENTER ONLY)
ALT: 20 U/L (ref 0–44)
AST: 17 U/L (ref 15–41)
Albumin: 2.7 g/dL — ABNORMAL LOW (ref 3.5–5.0)
Alkaline Phosphatase: 102 U/L (ref 38–126)
Anion gap: 2 — ABNORMAL LOW (ref 5–15)
BUN: 12 mg/dL (ref 8–23)
CO2: 33 mmol/L — ABNORMAL HIGH (ref 22–32)
Calcium: 8.7 mg/dL — ABNORMAL LOW (ref 8.9–10.3)
Chloride: 104 mmol/L (ref 98–111)
Creatinine: 0.73 mg/dL (ref 0.61–1.24)
GFR, Estimated: >60 mL/min (ref >60)
Glucose, Bld: 113 mg/dL — ABNORMAL HIGH (ref 70–99)
Potassium: 3.9 mmol/L (ref 3.5–5.1)
Sodium: 139 mmol/L (ref 135–145)
Total Bilirubin: 0.3 mg/dL (ref 0.3–1.2)
Total Protein: 5.6 g/dL — ABNORMAL LOW (ref 6.5–8.1)

## 2022-02-19 MED ORDER — SODIUM CHLORIDE 0.9 % IV SOLN: INTRAVENOUS | Status: DC

## 2022-02-19 NOTE — Progress Notes (Signed)
Symptom Management Consult note [place]    Patient Care Team: Shon Baton, MD as PCP - General (Internal Medicine) Gwenlyn Found Pearletha Forge, MD as PCP - Cardiology (Cardiology) Hunsucker, Bonna Gains, MD as Consulting Physician (Pulmonary Disease)    Name of the patient: Pedro Pope  [id number]  December 13, 1940   Date of visit: 02/19/2022    Chief complaint/ Reason for visit- urinary symptoms, confusion  Oncology History  Small cell lung cancer, overlapping sites of left lung ([place])  01/30/2022 Initial Diagnosis   Small cell lung cancer, overlapping sites of left lung ([place])   01/30/2022 Cancer Staging   Staging form: Lung, AJCC 8th Edition - Clinical: Stage IVA (cT3, cN2, cM1a) - Signed by Curt Bears, MD on 01/30/2022   02/04/2022 -  Chemotherapy   Patient is on Treatment Plan : LUNG SMALL CELL EXTENSIVE STAGE Durvalumab + Carboplatin D1 + Etoposide D1-3 q21d x 4 Cycles / Durvalumab q28d       Current Therapy: Palliative systemic chemotherapy with carboplatin on day 1 and etoposide 80 Mg/M2 on days 1, 2 and 3 with Cosela before the chemotherapy and Imfinzi 1500 Mg IV on day 1 every 3 weeks  Interval history- Pedro Pope. is a 81 y.o. with oncologic history as above presenting to [facility] today with chief complaint of urinary symptoms and confusion.  Patient's son is accompanying him and provides majority of history.  He states for the last 1 month patient has had cognitive issues.  He describes it as patient seeming forgetful, unable to answer yes or no questions and instead says "I do not know" frequently and abnormal behavior.  He gives an example of when patient was in the bathroom he had to be prompted to sit down to have a bowel movement.  Son states these are all changes since starting chemo.  His first treatment was 02/04/22. Patient was prescribed gabapentin for nerve pain and after reading it could have a side effect of confusion patient stopped taking the  medication x6 days ago.  Patient has not been complaining of any pain. He does have oxycodone at home to take for pain if needed.  Patient is usually more conversational and aware in the morning and gets worse in the evening.  Patient has not had any falls or head injury.  Patient states that he had some urinary frequency x4 days ago that has since resolved.  He has a history of prostate cancer in his 74s that was treated at [facility]  He admitted some dysuria as well.  He denies any abdominal pain, nausea or vomiting.  Also denies fever, chills, headache, chest pain, shortness of breath, back pain, numbness, dizziness, fatigue, weakness or tingling. Of note patient was seen previously for bilateral lower extremity edema.  He followed up with cardiology and was started on as needed Lasix.  Patient has only had to take 1 dose of Lasix at home and leg swelling has completely resolved.      ROS  All other systems are reviewed and are negative for acute change except as noted in the HPI.    Allergies  Allergen Reactions   Penicillins Anaphylaxis   Corn Syrup [Glucose] Other (See Comments)    High fructose corn syrup causes gout   Aspirin Adult Low [Aspirin] Itching and Rash    Dr. [name] suspects that long term caused a rash, short term use appears to be ok     Past Medical History:  Diagnosis Date  Anosmia    CAD (coronary artery disease), native coronary artery    a. 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 placed on colchicine    ED (erectile dysfunction)    Elevated TSH    Fatty liver    Gout    Ischemic cardiomyopathy    a. 2D ECHO 11/04/14 w/ EF 40-45% with LV WMA, G2DD, mild MR, mild LA dilation, mod TR.   PAF (paroxysmal atrial fibrillation) ([place])    a. Dx on 11/03/14 admission. Not placed on longterm AC due to DAPT w/ receent DES. Will plan for outpt heart monitor    Pre-diabetes     a. HgA1c 6.1 in 10/2014   Prostate cancer [facility])    s/p prostatectomy and XRT   Rheumatic fever 1947     Past Surgical History:  Procedure Laterality Date   CORONARY ANGIOPLASTY WITH STENT PLACEMENT  10/30/14   STEMI s/p DES to proximal LAD; residual 80 % proximal and 60% mid LCx diease   LEFT HEART CATHETERIZATION WITH CORONARY ANGIOGRAM N/A 10/30/2014   Procedure: LEFT HEART CATHETERIZATION WITH CORONARY ANGIOGRAM;  Surgeon: Lorretta Harp, MD;  Location: [facility] CATH LAB;  Service: Cardiovascular;  Laterality: N/A;   PERCUTANEOUS CORONARY STENT INTERVENTION (PCI-S)  10/30/2014   Procedure: PERCUTANEOUS CORONARY STENT INTERVENTION (PCI-S);  Surgeon: Lorretta Harp, MD;  Location: [facility] CATH LAB;  Service: Cardiovascular;;   PROSTATECTOMY     THORACENTESIS Left 12/31/2021   Procedure: THORACENTESIS;  Surgeon: Candee Furbish, MD;  Location: [facility] ENDOSCOPY;  Service: Pulmonary;  Laterality: Left;   TONSILLECTOMY     VIDEO ASSISTED THORACOSCOPY (VATS)/DECORTICATION Left 01/21/2022   Procedure: VIDEO ASSISTED THORACOSCOPY (VATS)/DECORTICATION;  Surgeon: Lajuana Matte, MD;  Location: [facility] OR;  Service: Thoracic;  Laterality: Left;    Social History   Socioeconomic History   Marital status: Married    Spouse name: Not on file   Number of children: Not on file   Years of education: Not on file   Highest education level: Not on file  Occupational History   Not on file  Tobacco Use   Smoking status: Former    Types: Cigarettes    Quit date: 08/18/1992    Years since quitting: 29.5   Smokeless tobacco: Never  Vaping Use   Vaping Use: Not on file  Substance and Sexual Activity   Alcohol use: Not Currently    Comment: social   Drug use: No   Sexual activity: Not Currently  Other Topics Concern   Not on file  Social History Narrative   Pt lives in [place] with spouse.  Realtor.  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 [employer]  Insecurity: Not on file  Transportation Needs: Not on file  Physical Activity: Not on file  Stress: Not on file  Social Connections: Not on file  Intimate Partner Violence: Not on file    Family History  Problem Relation Age of Onset   Cancer Other    Coronary artery disease Brother 19       s/p CABG   Diabetes Other    Hyperlipidemia Other    Hypertension Other    Gout Other      Current Outpatient Medications:    acetaminophen (TYLENOL) 500 MG tablet, Take 500 mg by mouth every 6 (six) hours  as needed for mild pain or moderate pain., Disp: , Rfl:    albuterol (VENTOLIN HFA) 108 (90 Base) MCG/ACT inhaler, Inhale 2 puffs into the lungs every 4 (four) hours as needed for shortness of breath., Disp: , Rfl:    ascorbic acid (VITAMIN C) 500 MG tablet, Take 500 mg by mouth daily., Disp: , Rfl:    Blood Pressure Monitor KIT, For daily blood pressure monitoring;  Arm Cuff, Disp: 1 each, Rfl: 0   carvedilol (COREG) 3.125 MG tablet, TAKE 1 TABLET BY MOUTH TWICE A DAY WITH MEALS (Patient taking differently: Take 3.125 mg by mouth daily. Take 1 tablet by mouth daily), Disp: 180 tablet, Rfl: 3   Cholecalciferol (VITAMIN D3) 25 MCG (1000 UT) CAPS, Take 1 capsule by mouth daily., Disp: , Rfl:    colchicine 0.6 MG tablet, Take 1 tablet (0.6 mg total) by mouth daily. (Patient taking differently: Take 0.6 mg by mouth daily as needed (Gout).), Disp: 30 tablet, Rfl: 3   furosemide (LASIX) 20 MG tablet, Take 20 mg daily as needed only for weight gain or swelling. Hold if systolic blood pressure is less than 95., Disp: 30 tablet, Rfl: 3   gabapentin (NEURONTIN) 100 MG capsule, TAKE ONE TO TWO CAPSULES BY MOUTH THREE TIMES DAILY as directed Orally Three times a day for 30 days, Disp: , Rfl:    nitroGLYCERIN (NITROSTAT) 0.4 MG SL tablet, Place 1 tablet (0.4 mg total) under the tongue every 5 (five) minutes as needed for chest pain., Disp: 25 tablet, Rfl: 2   nystatin powder, Apply 1 Application topically 2  (two) times daily., Disp: 15 g, Rfl: 0   oxyCODONE (ROXICODONE) 5 MG immediate release tablet, Take 0.5-1 tablets (2.5-5 mg total) by mouth every 4 (four) hours as needed., Disp: 30 tablet, Rfl: 0   prochlorperazine (COMPAZINE) 10 MG tablet, Take 1 tablet (10 mg total) by mouth every 6 (six) hours as needed for nausea or vomiting., Disp: 30 tablet, Rfl: 0  Current Facility-Administered Medications:    0.9 %  sodium chloride infusion, , Intravenous, Continuous, Walisiewicz, Winfred Iiams E, PA-C, Last Rate: 500 mL/hr at 02/19/22 1345, New Bag at 02/19/22 1345  PHYSICAL EXAM: ECOG FS:2 - Symptomatic, <50% confined to bed    Vitals:   02/19/22 1113 02/19/22 1448  BP: (!) 98/52 (!) 102/53  Pulse: 64 73  Resp: 20 18  Temp: 98.6 F (37 C) 98 F (36.7 C)  TempSrc: Oral Oral  SpO2: 99% 99%  Weight: 125 lb 1.6 oz (56.7 kg)    Physical Exam Vitals and nursing note reviewed.  Constitutional:      Appearance: He is well-developed. He is not ill-appearing or toxic-appearing.  HENT:     Head: Normocephalic and atraumatic.     Nose: Nose normal.     Mouth/Throat:     Mouth: Mucous membranes are dry.  Eyes:     General: No scleral icterus.       Right eye: No discharge.        Left eye: No discharge.     Conjunctiva/sclera: Conjunctivae normal.  Neck:     Vascular: No JVD.  Cardiovascular:     Rate and Rhythm: Normal rate and regular rhythm.     Pulses: Normal pulses.     Heart sounds: Normal heart sounds.  Pulmonary:     Effort: Pulmonary effort is normal.     Breath sounds: Normal breath sounds.     Comments: 99% oxygen saturation on 4 L nasal  cannula Abdominal:     General: There is no distension.     Palpations: Abdomen is soft. There is no mass.     Tenderness: There is no abdominal tenderness. There is no right CVA tenderness, left CVA tenderness, guarding or rebound.     Hernia: No hernia is present.  Musculoskeletal:        General: Normal range of motion.     Cervical back:  Normal range of motion.     Right lower leg: No edema.     Left lower leg: No edema.  Skin:    General: Skin is warm and dry.  Neurological:     Mental Status: He is oriented to person, place, and time.     GCS: GCS eye subscore is 4. GCS verbal subscore is 5. GCS motor subscore is 6.     Comments: Patient is alert to self, location, and date.  Speech is clear and goal oriented, follows commands CN III-XII intact, no facial droop Normal strength in upper and lower extremities bilaterally including dorsiflexion and plantar flexion, strong and equal grip strength Sensation normal to light and sharp touch Moves extremities without ataxia, coordination intact Normal finger to nose and rapid alternating movements   Psychiatric:        Behavior: Behavior normal.        LABORATORY DATA: I have reviewed the data as listed    Latest Ref Rng & Units 02/19/2022   10:32 AM 02/11/2022    8:11 AM 02/07/2022    8:31 PM  CBC  WBC 4.0 - 10.5 K/uL 2.6  6.3    Hemoglobin 13.0 - 17.0 g/dL 11.9  12.0  11.6   Hematocrit 39.0 - 52.0 % 36.7  37.1  34.0   Platelets 150 - 400 K/uL 170  301          Latest Ref Rng & Units 02/19/2022   10:32 AM 02/11/2022    8:11 AM 02/07/2022    8:31 PM  CMP  Glucose 70 - 99 mg/dL 113  98  85   BUN 8 - 23 mg/dL 12  14  30    Creatinine 0.61 - 1.24 mg/dL 0.73  0.76  1.00   Sodium 135 - 145 mmol/L 139  137  135   Potassium 3.5 - 5.1 mmol/L 3.9  4.1  4.8   Chloride 98 - 111 mmol/L 104  101  102   CO2 22 - 32 mmol/L 33  32    Calcium 8.9 - 10.3 mg/dL 8.7  8.3    Total Protein 6.5 - 8.1 g/dL 5.6  5.2    Total Bilirubin 0.3 - 1.2 mg/dL 0.3  0.3    Alkaline Phos 38 - 126 U/L 102  82    AST 15 - 41 U/L 17  22    ALT 0 - 44 U/L 20  23         RADIOGRAPHIC STUDIES: I have personally reviewed the radiological images as listed and agreed with the findings in the report. No images are attached to the encounter. NM PET Image Initial (PI) Skull Base To Thigh  Result  Date: 02/10/2022 CLINICAL DATA:  Initial treatment strategy for small cell lung cancer staging. EXAM: NUCLEAR MEDICINE PET SKULL BASE TO THIGH TECHNIQUE: 6.9 mCi F-18 FDG was injected intravenously. Full-ring PET imaging was performed from the skull base to thigh after the radiotracer. CT data was obtained and used for attenuation correction and anatomic localization. Fasting blood glucose: 98  mg/dl COMPARISON:  Abdomen pelvis CT February 07, 2022. And recent CTs of the chest and abdomen and pelvis from earlier in June. FINDINGS: Mediastinal blood pool activity: SUV max 1.99 Liver activity: SUV max NA NECK: No hypermetabolic lymph nodes in the neck. Incidental CT findings: none CHEST: Extensive circumferential pleural nodularity without substantial change based on comparison with abdominal imaging acquired on February 07, 2022 with respect to the lung bases. Improving appearance of the chest compared to the January 20, 2022 evaluation with improved aeration of LEFT upper lobe since this time following PleurX catheter placement. Extensive hypermetabolic disease about the LEFT chest in a circumferential distribution (image 44/4) 1.8 cm extrapleural nodule maximum SUV 3.7. (Image 67/4) soft tissue nodularity along the pleural surface tracking along the major fissure in the LEFT chest with a maximum SUV of 9.4, difficult to measure given adjacent consolidative changes, pleural fluid and interstitial thickening. Increased metabolic activity along the medial LEFT costodiaphragmatic sulcus with a maximum SUV of 7.2 and nodular circumferential increased metabolic activity along the inferior chest with areas of loculated pleural fluid not substantially changed in volume compared to recent CT imaging of the abdomen and pelvis with respect to the lung bases. AP window adenopathy and LEFT paratracheal adenopathy largest 16 mm (image 65/4) previously 18 mm on the recent chest CT. Maximum SUV of 2.54. No frankly hypermetabolic lymph nodes  but with similar pattern of mild nodal enlargement slightly improved from the chest CT acquired on January 20, 2022. Diffuse septal thickening in areas of previously collapsed lung in the LEFT upper lobe. Incidental CT findings: Calcified coronary artery disease, three-vessel disease. Heart size moderately enlarged. Aorta is not aneurysmal. Loculated pleural fluid in the anterior LEFT chest (image 76/4) 7.2 x 5.7 cm. Again with improved aeration in the LEFT upper lobe following placement of a PleurX catheter. No pneumothorax. Patchy atelectasis at the RIGHT lung base. ABDOMEN/PELVIS: LEFT retroperitoneal soft tissue adjacent to LEFT renal artery adjacent to LEFT diaphragmatic crus (image 117/4) maximum SUV of 2.28 not changed from recent imaging in terms of its size. Low-attenuation lesion in posterior RIGHT hepatic lobe compatible with suspected metastatic lesion appears larger than on the recent CT of February 07, 2022 and is of similar size when compared to the exam of January 17, 2022. This measures 12 mm and shows no increased metabolic activity above background liver uptake. Incidental CT findings: No acute findings relative to gallbladder, pancreas, spleen, adrenal glands or kidneys. Signs of nephrolithiasis on the RIGHT with an interpolar calculus measuring approximately 7 mm. No acute gastrointestinal findings. Colonic diverticulosis. Post prostatectomy. Aortic atherosclerosis. No adenopathy by size criteria in the abdomen or in the pelvis. SKELETON: No focal hypermetabolic activity to suggest skeletal metastasis. Incidental CT findings: none IMPRESSION: Extensive pleural and parenchymal involvement in the LEFT chest with improvement based on comparison with imaging from earlier in June. Areas of greatest FDG uptake along the peripheral LEFT chest. Despite improved aeration of the LEFT upper chest there is extensive consolidation and there are areas of septal thickening. Background lymphangitic tumor is considered.  Would also correlate with any respiratory symptoms that would suggest ongoing infection though findings are nonspecific. Mediastinal adenopathy with only mild increased metabolic activity remains concerning, this will serve as a baseline for future follow-up. Abdominal disease adjacent to the LEFT diaphragmatic crus and in the liver also without substantial metabolic activity but warranting attention on subsequent imaging. Note that the liver lesion appears larger on the current study when compared to the  recent contrasted evaluation and is of similar size when compared to the study of January 17, 2022, significance is uncertain. Post prostatectomy. Aortic Atherosclerosis (ICD10-I70.0) and Emphysema (ICD10-J43.9). Electronically Signed   By: Zetta Bills M.D.   On: 02/10/2022 14:25   MR BRAIN W WO CONTRAST  Result Date: 02/10/2022 CLINICAL DATA:  Staging examination for small cell lung cancer EXAM: MRI HEAD WITHOUT AND WITH CONTRAST TECHNIQUE: Multiplanar, multiecho pulse sequences of the brain and surrounding structures were obtained without and with intravenous contrast. CONTRAST:  7m GADAVIST GADOBUTROL 1 MMOL/ML IV SOLN COMPARISON:  None FINDINGS: Brain: Diffusion imaging does not show any acute or subacute infarction. There is generalized brain volume loss. There is no evidence metastatic disease. No hemorrhage, hydrocephalus or extra-axial collection. Vascular: Major vessels at the base of the brain show flow. Skull and upper cervical spine: Negative Sinuses/Orbits: There are some chronic inflammatory changes of the right frontal sinus. Orbits negative. Other: None IMPRESSION: No evidence of metastatic disease.  Age related volume loss. Electronically Signed   By: MNelson ChimesM.D.   On: 02/10/2022 11:53   CT ABDOMEN PELVIS W CONTRAST  Result Date: 02/07/2022 CLINICAL DATA:  Metastatic small cell lung cancer.  Constipation. EXAM: CT ABDOMEN AND PELVIS WITH CONTRAST TECHNIQUE: Multidetector CT imaging of  the abdomen and pelvis was performed using the standard protocol following bolus administration of intravenous contrast. RADIATION DOSE REDUCTION: This exam was performed according to the departmental dose-optimization program which includes automated exposure control, adjustment of the mA and/or kV according to patient size and/or use of iterative reconstruction technique. CONTRAST:  1073mOMNIPAQUE IOHEXOL 300 MG/ML  SOLN COMPARISON:  CT scan 01/17/2022 FINDINGS: Lower chest: Complex loculated pleural fluid collection at the left lung base with extensive pleural tumor. A PleurX drainage catheter is in place. Hepatobiliary: Interval decrease in size of the segment 7 hepatic lesion. Measures approximately 5 mm and previously measured 12 mm. No other lesions are identified. The gallbladder is unremarkable. No common bile duct dilatation. Pancreas: No mass, inflammation or ductal dilatation. Spleen: Normal size.  No focal lesions. Adrenals/Urinary Tract: Adrenal glands kidneys are unremarkable. Stable right renal calculus. The bladder is moderately distended. Stomach/Bowel: The stomach, duodenum, small bowel and colon are grossly normal. No acute inflammatory process, mass lesions or obstructive findings. Moderate to large stool burden noted along with stool in the rectum the suggesting constipation. Vascular/Lymphatic: Stable atherosclerotic calcifications involving the aorta and branch vessels. Stable extensive abdominal lymphadenopathy. Reproductive: The prostate gland seminal vesicles are surgically absent. Other: No ascites. Musculoskeletal: No significant bony findings. IMPRESSION: 1. Interval decrease in size of the segment 7 hepatic lesion. 2. Stable extensive upper abdominal lymphadenopathy. 3. Moderate to large stool burden along with stool in the rectum suggesting constipation. 4. Stable right renal calculus. 5. Complex loculated pleural fluid collection at the left lung base with extensive pleural tumor. A  PleurX drainage catheter is in place. Aortic Atherosclerosis (ICD10-I70.0). Electronically Signed   By: P.Marijo Sanes.D.   On: 02/07/2022 21:36   DG Abdomen 1 View  Result Date: 02/07/2022 CLINICAL DATA:  Constipation EXAM: ABDOMEN - 1 VIEW COMPARISON:  CT done on 01/17/2022 FINDINGS: Bowel gas pattern is nonspecific. There is no small bowel dilation. Moderate amount of stool is seen in the colon. There is no fecal impaction in the rectosigmoid. Left pleural effusion is seen. Possible left chest tube is noted. Surgical clips are seen in the pelvis. IMPRESSION: Nonspecific bowel gas pattern. Moderate stool burden in the ascending,  transverse and descending colon. There is no fecal impaction in the rectosigmoid. Electronically Signed   By: Elmer Picker M.D.   On: 02/07/2022 19:51   DG Chest 2 View  Result Date: 02/07/2022 CLINICAL DATA:  Provided history: Small-cell lung cancer, overlapping sites of left lung. Additional history provided: VATS/decortication surgery 01/21/2022. EXAM: CHEST - 2 VIEW COMPARISON:  Chest radiographs 01/22/2022 and earlier. Chest CT 01/20/2022 FINDINGS: Unchanged position of a chest tube/surgical drain, with tip projecting at the level of the left lung apex. The cardiomediastinal silhouette is stable. Aortic atherosclerosis. Persistent dense opacity throughout the left upper to mid lung field. As before, there is relatively better aeration of the left lung base, although with irregular opacities also present at this site. At least small residual left pleural effusion. No appreciable airspace consolidation within the right lung. Background emphysema. No evidence of pneumothorax. No acute bony abnormality identified. Degenerative changes of the spine. IMPRESSION: 1. No significant change from the prior chest radiograph of 01/22/2022. 2. Left-sided chest tube/surgical drain with tip projecting at the level of the left lung apex. 3. Persistent dense opacity throughout the left  upper and mid lung field. 4. As before, there is relatively better aeration of the left lung base, although with irregular opacities also present at this site. These opacities may reflect atelectasis and/or consolidation. 5. At least small residual left pleural effusion. 6. Aortic Atherosclerosis (ICD10-I70.0) and Emphysema (ICD10-J43.9). Electronically Signed   By: Kellie Simmering D.O.   On: 02/07/2022 10:53   DG CHEST PORT 1 VIEW  Result Date: 01/22/2022 CLINICAL DATA:  Ischemic cardiomyopathy.  Postoperative check. EXAM: PORTABLE CHEST 1 VIEW COMPARISON:  January 17, 2022 FINDINGS: The heart size and mediastinal contours are stable. Dense consolidation of the left lung is identified with interval slight better aeration of the left lung base compared prior exam. A left chest tube is identified with the distal tip in the left apex. Subcutaneous emphysema is identified over the left chest. The right lung demonstrate emphysematous changes. The visualized skeletal structures are stable. IMPRESSION: Dense consolidation of the left lung with interval slight better aeration of the left lung base compared prior exam. Left chest tube is identified with the distal tip in the left apex. Electronically Signed   By: Abelardo Diesel M.D.   On: 01/22/2022 07:44   CT CHEST W CONTRAST  Result Date: 01/20/2022 CLINICAL DATA:  Large left pleural effusion. EXAM: CT CHEST WITH CONTRAST TECHNIQUE: Multidetector CT imaging of the chest was performed during intravenous contrast administration. RADIATION DOSE REDUCTION: This exam was performed according to the departmental dose-optimization program which includes automated exposure control, adjustment of the mA and/or kV according to patient size and/or use of iterative reconstruction technique. CONTRAST:  128m OMNIPAQUE IOHEXOL 300 MG/ML  SOLN COMPARISON:  Recent chest CTs from May 8 and Jan 15, 2022 FINDINGS: Cardiovascular: The heart is normal in size. No pericardial effusion. The aorta  is normal in caliber. Stable atherosclerotic calcifications. The pulmonary arteries are patent. Stable three-vessel coronary artery calcifications. Mediastinum/Nodes: Stable mediastinal lymphadenopathy. 2.5 cm AP window node on image number 63/3. Left hilar nodes versus hilar pleural tumor. The more lateral lesion measures 21 mm and the more medial lesion measures 13 mm. The esophagus is grossly normal. Lungs/Pleura: Very large left pleural effusion increased in size since the prior CT scan. Essentially drowned/obstructed left lung. Extensive enhancing pleural tumor involving the left hemithorax. The heart and mediastinum are displaced to the right because of the large left hydrothorax.  The right lung demonstrates advanced emphysematous changes and pulmonary scarring but no pulmonary nodules or right-sided pleural tumor. Upper Abdomen: No significant upper abdominal findings. Advanced vascular calcifications. Musculoskeletal: No significant bony findings. IMPRESSION: 1. Very large left pleural effusion increased in size since the prior CT scan. 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 2. Essentially drowned/obstructed left lung. 3. Extensive enhancing left pleural tumor and mediastinal adenopathy. 4. Advanced emphysematous changes and pulmonary scarring in the right lung but no pulmonary nodules or right-sided pleural tumor. 5. Stable advanced vascular calcifications. Aortic Atherosclerosis (ICD10-I70.0) and Emphysema (ICD10-J43.9). Electronically Signed   By: Marijo Sanes M.D.   On: 01/20/2022 16:43     ASSESSMENT & PLAN: Patient is a 81 y.o. 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 Julien Nordmann.  I have viewed most recent oncology note and lab work.   #) Confusion-patient is nontoxic-appearing.  He is hemodynamically stable.  Neuro exam reveals patient is slow to answer questions although does answer them correctly.   Confusion has been ongoing since starting chemo and not acute based on history taken today.  Labs today checked shows CBC with stable hemoglobin, WBC 2.6 and ANC 1.6 with normal platelets.  Patient received Cosela during treatment.  Discussed neutropenic precautions with patient and his son.  CMP is without significant electrolyte derangement.  CO2 is barely elevated at 33.  It is reassuring that patient has no signs of respiratory distress and is moving air in all lung fields.  Patient has low total protein and albumin likely related to poor diet.  Low suspicion for acute neurologic problem at this time based on normal neuro exam.  Patient could be experiencing chemo brain as he recently started an aggressive therapy. Patient had MR brain 02/09/22 with impression of no metatastic disease. Radiologist did comment on age related volume loss.  #) Poor p.o. intake-  Clinically patient appears dehydrated.  Patient given 500 mL normal saline here in clinic.  He was having bilateral lower extremity edema however on exam today has no signs of volume overload.  #) Dysuria-patient had dysuria however it resolved x4 days ago.  UA was checked and does not show any signs of infection.  No indications for antibiotics at this time.  #)Small cell lung cancer- Next appointment with oncologist is 02/24/22.  I discussed patient with oncologist today who is agreeable with plan for rehydration.  Dr. Earlie Server feels patient will rebound after further chemo treatments.   Strict ED precautions discussed should symptoms worsen.  Visit Diagnosis: 1. At risk for dehydration due to poor fluid intake   2. Dysuria   3. Confusion   4. Small cell lung cancer, overlapping sites of left lung ([place])      No orders of the defined types were placed in this encounter.   All questions were answered. The patient knows to call the clinic with any problems, questions or concerns. No barriers to learning was detected.  I have spent a  total of 20 minutes minutes of face-to-face and non-face-to-face time, preparing to see the patient, obtaining and/or reviewing separately obtained history, performing a medically appropriate examination, counseling and educating the patient, ordering tests,  documenting clinical information in the electronic health record, and care coordination.     Thank you for allowing me to participate in the care of this patient.    Barrie Folk, PA-C Department of Hematology/Oncology [facility]  [place] [facility] Phone: [phone]  [phone]    02/19/2022 3:59 PM

## 2022-02-19 NOTE — Progress Notes (Signed)
Urine labs entered for [facility]

## 2022-02-20 DIAGNOSIS — J942 Hemothorax: Secondary | ICD-10-CM | POA: Diagnosis not present

## 2022-02-20 DIAGNOSIS — C3490 Malignant neoplasm of unspecified part of unspecified bronchus or lung: Secondary | ICD-10-CM | POA: Diagnosis not present

## 2022-02-20 DIAGNOSIS — J9601 Acute respiratory failure with hypoxia: Secondary | ICD-10-CM | POA: Diagnosis not present

## 2022-02-20 DIAGNOSIS — Z4801 Encounter for change or removal of surgical wound dressing: Secondary | ICD-10-CM | POA: Diagnosis not present

## 2022-02-20 DIAGNOSIS — J910 Malignant pleural effusion: Secondary | ICD-10-CM | POA: Diagnosis not present

## 2022-02-20 DIAGNOSIS — Z48813 Encounter for surgical aftercare following surgery on the respiratory system: Secondary | ICD-10-CM | POA: Diagnosis not present

## 2022-02-20 LAB — URINE CULTURE: Culture: <10000 — AB

## 2022-02-21 ENCOUNTER — Telehealth: Payer: Self-pay | Admitting: *Deleted

## 2022-02-21 ENCOUNTER — Ambulatory Visit: Payer: Medicare Other | Admitting: Adult Health

## 2022-02-21 NOTE — Telephone Encounter (Signed)
Received message from [place], [place] w/[facility]. She saw patient recently and reports patient is weak, has poor po intake and b/p ranges 90-100/60s. She states he felt better following IV fluids given here at [facility] on 02/19/22.  She asked if provider would consider ordering weekly fluids at this time.   Informed her that patient has appt with provider Monday and will be informed of this message with her assessment.  She verbalized understanding.  Message sent to Dr. Julien Nordmann and support staff.

## 2022-02-22 DIAGNOSIS — C3490 Malignant neoplasm of unspecified part of unspecified bronchus or lung: Secondary | ICD-10-CM | POA: Diagnosis not present

## 2022-02-22 DIAGNOSIS — Z4801 Encounter for change or removal of surgical wound dressing: Secondary | ICD-10-CM | POA: Diagnosis not present

## 2022-02-22 DIAGNOSIS — J9601 Acute respiratory failure with hypoxia: Secondary | ICD-10-CM | POA: Diagnosis not present

## 2022-02-22 DIAGNOSIS — J942 Hemothorax: Secondary | ICD-10-CM | POA: Diagnosis not present

## 2022-02-22 DIAGNOSIS — Z48813 Encounter for surgical aftercare following surgery on the respiratory system: Secondary | ICD-10-CM | POA: Diagnosis not present

## 2022-02-22 DIAGNOSIS — J910 Malignant pleural effusion: Secondary | ICD-10-CM | POA: Diagnosis not present

## 2022-02-24 ENCOUNTER — Inpatient Hospital Stay (HOSPITAL_BASED_OUTPATIENT_CLINIC_OR_DEPARTMENT_OTHER): Payer: Medicare Other | Admitting: Internal Medicine

## 2022-02-24 ENCOUNTER — Inpatient Hospital Stay: Payer: Medicare Other

## 2022-02-24 ENCOUNTER — Inpatient Hospital Stay (HOSPITAL_BASED_OUTPATIENT_CLINIC_OR_DEPARTMENT_OTHER): Payer: Medicare Other | Admitting: Physician Assistant

## 2022-02-24 ENCOUNTER — Other Ambulatory Visit: Payer: Self-pay

## 2022-02-24 VITALS — BP 126/96 | HR 66 | Temp 97.7°F | Resp 17 | Wt 124.5 lb

## 2022-02-24 VITALS — BP 101/58 | HR 70 | Temp 97.4°F | Resp 17

## 2022-02-24 DIAGNOSIS — C3482 Malignant neoplasm of overlapping sites of left bronchus and lung: Secondary | ICD-10-CM

## 2022-02-24 DIAGNOSIS — I480 Paroxysmal atrial fibrillation: Secondary | ICD-10-CM | POA: Diagnosis not present

## 2022-02-24 DIAGNOSIS — C3490 Malignant neoplasm of unspecified part of unspecified bronchus or lung: Secondary | ICD-10-CM

## 2022-02-24 DIAGNOSIS — Z48813 Encounter for surgical aftercare following surgery on the respiratory system: Secondary | ICD-10-CM | POA: Diagnosis not present

## 2022-02-24 DIAGNOSIS — I255 Ischemic cardiomyopathy: Secondary | ICD-10-CM | POA: Diagnosis not present

## 2022-02-24 DIAGNOSIS — I2510 Atherosclerotic heart disease of native coronary artery without angina pectoris: Secondary | ICD-10-CM | POA: Diagnosis not present

## 2022-02-24 DIAGNOSIS — M109 Gout, unspecified: Secondary | ICD-10-CM | POA: Diagnosis not present

## 2022-02-24 DIAGNOSIS — R4689 Other symptoms and signs involving appearance and behavior: Secondary | ICD-10-CM | POA: Diagnosis not present

## 2022-02-24 DIAGNOSIS — E860 Dehydration: Secondary | ICD-10-CM | POA: Diagnosis not present

## 2022-02-24 DIAGNOSIS — Z8701 Personal history of pneumonia (recurrent): Secondary | ICD-10-CM | POA: Diagnosis not present

## 2022-02-24 DIAGNOSIS — I5022 Chronic systolic (congestive) heart failure: Secondary | ICD-10-CM | POA: Diagnosis not present

## 2022-02-24 DIAGNOSIS — K760 Fatty (change of) liver, not elsewhere classified: Secondary | ICD-10-CM | POA: Diagnosis not present

## 2022-02-24 DIAGNOSIS — J942 Hemothorax: Secondary | ICD-10-CM | POA: Diagnosis not present

## 2022-02-24 DIAGNOSIS — R7303 Prediabetes: Secondary | ICD-10-CM | POA: Diagnosis not present

## 2022-02-24 DIAGNOSIS — I252 Old myocardial infarction: Secondary | ICD-10-CM | POA: Diagnosis not present

## 2022-02-24 DIAGNOSIS — Z5111 Encounter for antineoplastic chemotherapy: Secondary | ICD-10-CM | POA: Diagnosis not present

## 2022-02-24 DIAGNOSIS — J479 Bronchiectasis, uncomplicated: Secondary | ICD-10-CM | POA: Diagnosis not present

## 2022-02-24 DIAGNOSIS — Z5112 Encounter for antineoplastic immunotherapy: Secondary | ICD-10-CM | POA: Diagnosis not present

## 2022-02-24 DIAGNOSIS — I081 Rheumatic disorders of both mitral and tricuspid valves: Secondary | ICD-10-CM | POA: Diagnosis not present

## 2022-02-24 DIAGNOSIS — J9601 Acute respiratory failure with hypoxia: Secondary | ICD-10-CM | POA: Diagnosis not present

## 2022-02-24 DIAGNOSIS — Z8546 Personal history of malignant neoplasm of prostate: Secondary | ICD-10-CM | POA: Diagnosis not present

## 2022-02-24 DIAGNOSIS — I110 Hypertensive heart disease with heart failure: Secondary | ICD-10-CM | POA: Diagnosis not present

## 2022-02-24 DIAGNOSIS — N529 Male erectile dysfunction, unspecified: Secondary | ICD-10-CM | POA: Diagnosis not present

## 2022-02-24 DIAGNOSIS — J910 Malignant pleural effusion: Secondary | ICD-10-CM | POA: Diagnosis not present

## 2022-02-24 DIAGNOSIS — J439 Emphysema, unspecified: Secondary | ICD-10-CM | POA: Diagnosis not present

## 2022-02-24 DIAGNOSIS — Z4801 Encounter for change or removal of surgical wound dressing: Secondary | ICD-10-CM | POA: Diagnosis not present

## 2022-02-24 DIAGNOSIS — J8410 Pulmonary fibrosis, unspecified: Secondary | ICD-10-CM | POA: Diagnosis not present

## 2022-02-24 DIAGNOSIS — Z955 Presence of coronary angioplasty implant and graft: Secondary | ICD-10-CM | POA: Diagnosis not present

## 2022-02-24 DIAGNOSIS — C782 Secondary malignant neoplasm of pleura: Secondary | ICD-10-CM | POA: Diagnosis not present

## 2022-02-24 DIAGNOSIS — N200 Calculus of kidney: Secondary | ICD-10-CM | POA: Diagnosis not present

## 2022-02-24 DIAGNOSIS — I700 Atherosclerosis of aorta: Secondary | ICD-10-CM | POA: Diagnosis not present

## 2022-02-24 LAB — CBC WITH DIFFERENTIAL (CANCER CENTER ONLY)
Abs Immature Granulocytes: 0.03 10*3/uL (ref 0.00–0.07)
Basophils Absolute: 0 10*3/uL (ref 0.0–0.1)
Basophils Relative: 1 %
Eosinophils Absolute: 0 10*3/uL (ref 0.0–0.5)
Eosinophils Relative: 1 %
HCT: 38.5 % — ABNORMAL LOW (ref 39.0–52.0)
Hemoglobin: 12.4 g/dL — ABNORMAL LOW (ref 13.0–17.0)
Immature Granulocytes: 1 %
Lymphocytes Relative: 40 %
Lymphs Abs: 1.8 10*3/uL (ref 0.7–4.0)
MCH: 28.9 pg (ref 26.0–34.0)
MCHC: 32.2 g/dL (ref 30.0–36.0)
MCV: 89.7 fL (ref 80.0–100.0)
Monocytes Absolute: 1.1 10*3/uL — ABNORMAL HIGH (ref 0.1–1.0)
Monocytes Relative: 24 %
Neutro Abs: 1.5 10*3/uL — ABNORMAL LOW (ref 1.7–7.7)
Neutrophils Relative %: 33 %
Platelet Count: 490 10*3/uL — ABNORMAL HIGH (ref 150–400)
RBC: 4.29 MIL/uL (ref 4.22–5.81)
RDW: 15.6 % — ABNORMAL HIGH (ref 11.5–15.5)
WBC Count: 4.4 10*3/uL (ref 4.0–10.5)
nRBC: 0 % (ref 0.0–0.2)

## 2022-02-24 LAB — CMP (CANCER CENTER ONLY)
ALT: 14 U/L (ref 0–44)
AST: 17 U/L (ref 15–41)
Albumin: 2.9 g/dL — ABNORMAL LOW (ref 3.5–5.0)
Alkaline Phosphatase: 109 U/L (ref 38–126)
Anion gap: 3 — ABNORMAL LOW (ref 5–15)
BUN: 13 mg/dL (ref 8–23)
CO2: 30 mmol/L (ref 22–32)
Calcium: 9 mg/dL (ref 8.9–10.3)
Chloride: 104 mmol/L (ref 98–111)
Creatinine: 0.74 mg/dL (ref 0.61–1.24)
GFR, Estimated: >60 mL/min (ref >60)
Glucose, Bld: 102 mg/dL — ABNORMAL HIGH (ref 70–99)
Potassium: 5.2 mmol/L — ABNORMAL HIGH (ref 3.5–5.1)
Sodium: 137 mmol/L (ref 135–145)
Total Bilirubin: 0.4 mg/dL (ref 0.3–1.2)
Total Protein: 5.9 g/dL — ABNORMAL LOW (ref 6.5–8.1)

## 2022-02-24 LAB — FUNGUS CULTURE WITH STAIN

## 2022-02-24 LAB — FUNGAL ORGANISM REFLEX

## 2022-02-24 LAB — FUNGUS CULTURE RESULT

## 2022-02-24 MED ORDER — SODIUM CHLORIDE 0.9 % IV SOLN
80.0000 mg/m2 | Freq: Once | INTRAVENOUS | Status: AC
  Administered 2022-02-24: 130 mg via INTRAVENOUS
  Filled 2022-02-24: qty 6.5

## 2022-02-24 MED ORDER — SODIUM CHLORIDE 0.9 % IV SOLN: Freq: Once | INTRAVENOUS | Status: AC

## 2022-02-24 MED ORDER — PALONOSETRON HCL INJECTION 0.25 MG/5ML
0.2500 mg | Freq: Once | INTRAVENOUS | Status: AC
  Administered 2022-02-24: 0.25 mg via INTRAVENOUS
  Filled 2022-02-24: qty 5

## 2022-02-24 MED ORDER — SODIUM CHLORIDE 0.9 % IV SOLN
1500.0000 mg | Freq: Once | INTRAVENOUS | Status: AC
  Administered 2022-02-24: 1500 mg via INTRAVENOUS
  Filled 2022-02-24: qty 30

## 2022-02-24 MED ORDER — SODIUM CHLORIDE 0.9 % IV SOLN
10.0000 mg | Freq: Once | INTRAVENOUS | Status: AC
  Administered 2022-02-24: 10 mg via INTRAVENOUS
  Filled 2022-02-24: qty 10

## 2022-02-24 MED ORDER — SODIUM CHLORIDE 0.9 % IV SOLN
296.8000 mg | Freq: Once | INTRAVENOUS | Status: AC
  Administered 2022-02-24: 300 mg via INTRAVENOUS
  Filled 2022-02-24: qty 30

## 2022-02-24 MED ORDER — TRILACICLIB DIHYDROCHLORIDE INJECTION 300 MG
240.0000 mg/m2 | Freq: Once | INTRAVENOUS | Status: AC
  Administered 2022-02-24: 405 mg via INTRAVENOUS
  Filled 2022-02-24: qty 27

## 2022-02-24 MED ORDER — SODIUM CHLORIDE 0.9 % IV SOLN
150.0000 mg | Freq: Once | INTRAVENOUS | Status: AC
  Administered 2022-02-24: 150 mg via INTRAVENOUS
  Filled 2022-02-24: qty 150

## 2022-02-24 NOTE — Progress Notes (Signed)
Per Anda Kraft, [place]- ok to restart IV cosela in new site if patient is in agreement.

## 2022-02-24 NOTE — Progress Notes (Signed)
Pt c/o pain around IV site.  Redness noted.  Infusion stopped and [place] PA paged

## 2022-02-24 NOTE — Progress Notes (Signed)
Called to infusion center for pain and redness around IV site on right forearm.  Patient receiving Cosela injection as well as chemotherapy today.  Patient had received about two thirds of the Cosela infusion before he noticed the redness and felt burning pain.  The area measures approximately 6 x 3 cm and has mild tenderness to palpation.  Exam consistent with cosela flare. IV was removed and new access obtained in left forearm. He received remainder of cosela infusion and proceeded to have chemotherapy infusion without adverse effects. Patient voiced he is considering a port-a-cath and plans to discuss this further with his family before making a decision. He will let staff know if he desires to move forward with port placement so primary oncologist can be made aware.

## 2022-02-24 NOTE — Progress Notes (Signed)
[facility] [phone]   [phone]  OFFICE PROGRESS NOTE  Shon Baton, MD [place] [address]  DIAGNOSIS: Extensive stage (T3, N2, M1 a) small cell lung cancer presented with obstructive left upper lobe lung mass in addition to mediastinal lymphadenopathy and malignant left pleural effusion as well as pleural-based metastasis as well as suspicious liver metastasis diagnosed in June 2023.  PRIOR THERAPY: None  CURRENT THERAPY: Palliative systemic chemotherapy with carboplatin initiated for AUC of 4 on day 1 and etoposide 80 Mg/M2 on days 1, 2 and 3 with Cosela before the chemotherapy and Imfinzi 1500 Mg IV on day 1 every 3 weeks.  Status post 1 cycle.    INTERVAL HISTORY: Pedro Pope. 81 y.o. male returns to the clinic today for follow-up visit accompanied by his son.  The patient continues to complain of increasing fatigue and weakness as well as lack of appetite.  He feels much better compared to before starting the chemotherapy.  He is still requiring home oxygen.  He denied having any current chest pain but has shortness of breath at baseline increased with exertion with mild cough and no hemoptysis.  He has significant decrease in the drainage of the left pleural effusion via the Pleurx.  They will reach out to the pulmonary office to remove the Pleurx catheter.  The patient denied having any nausea, vomiting, diarrhea or constipation.  He denied having any headache or visual changes.  He has no weight loss or night sweats.  He is here today for evaluation before starting cycle #2 of his treatment.  MEDICAL HISTORY: Past Medical History:  Diagnosis Date   Anosmia    CAD (coronary artery disease), native coronary artery    a. 10/29/2013 antlat STEMI s/p DES to pLAD; residual 80 % proximal and 60% mid LCx diease   Chronic headaches    Dressler's syndrome (HCC)    a. placed on colchicine    ED (erectile dysfunction)     Elevated TSH    Fatty liver    Gout    Ischemic cardiomyopathy    a. 2D ECHO 11/04/14 w/ EF 40-45% with LV WMA, G2DD, mild MR, mild LA dilation, mod TR.   PAF (paroxysmal atrial fibrillation) ([facility])    a. Dx on 11/03/14 admission. Not placed on longterm AC due to DAPT w/ receent DES. Will plan for outpt heart monitor    Pre-diabetes    a. HgA1c 6.1 in 10/2014   Prostate cancer [facility])    s/p prostatectomy and XRT   Rheumatic fever 1947    ALLERGIES:  is allergic to penicillins, corn syrup [glucose], and aspirin adult low [aspirin].  MEDICATIONS:  Current Outpatient Medications  Medication Sig Dispense Refill   acetaminophen (TYLENOL) 500 MG tablet Take 500 mg by mouth every 6 (six) hours as needed for mild pain or moderate pain.     albuterol (VENTOLIN HFA) 108 (90 Base) MCG/ACT inhaler Inhale 2 puffs into the lungs every 4 (four) hours as needed for shortness of breath.     ascorbic acid (VITAMIN C) 500 MG tablet Take 500 mg by mouth daily.     Blood Pressure Monitor KIT For daily blood pressure monitoring;  Arm Cuff 1 each 0   carvedilol (COREG) 3.125 MG tablet TAKE 1 TABLET BY MOUTH TWICE A DAY WITH MEALS (Patient taking differently: Take 3.125 mg by mouth daily. Take 1 tablet by mouth daily) 180 tablet 3   Cholecalciferol (  VITAMIN D3) 25 MCG (1000 UT) CAPS Take 1 capsule by mouth daily.     colchicine 0.6 MG tablet Take 1 tablet (0.6 mg total) by mouth daily. (Patient taking differently: Take 0.6 mg by mouth daily as needed (Gout).) 30 tablet 3   furosemide (LASIX) 20 MG tablet Take 20 mg daily as needed only for weight gain or swelling. Hold if systolic blood pressure is less than 95. 30 tablet 3   gabapentin (NEURONTIN) 100 MG capsule TAKE ONE TO TWO CAPSULES BY MOUTH THREE TIMES DAILY as directed Orally Three times a day for 30 days     nitroGLYCERIN (NITROSTAT) 0.4 MG SL tablet Place 1 tablet (0.4 mg total) under the tongue every 5 (five) minutes as needed for chest pain. 25 tablet 2    nystatin powder Apply 1 Application topically 2 (two) times daily. 15 g 0   oxyCODONE (ROXICODONE) 5 MG immediate release tablet Take 0.5-1 tablets (2.5-5 mg total) by mouth every 4 (four) hours as needed. 30 tablet 0   prochlorperazine (COMPAZINE) 10 MG tablet Take 1 tablet (10 mg total) by mouth every 6 (six) hours as needed for nausea or vomiting. 30 tablet 0   No current facility-administered medications for this visit.    SURGICAL HISTORY:  Past Surgical History:  Procedure Laterality Date   CORONARY ANGIOPLASTY WITH STENT PLACEMENT  10/30/14   STEMI s/p DES to proximal LAD; residual 80 % proximal and 60% mid LCx diease   LEFT HEART CATHETERIZATION WITH CORONARY ANGIOGRAM N/A 10/30/2014   Procedure: LEFT HEART CATHETERIZATION WITH CORONARY ANGIOGRAM;  Surgeon: Lorretta Harp, MD;  Location[facility] C[facility] LAB;  Service: Cardiovascular;  Laterality: N/A;   PERCUTANEOUS CORONARY STENT INTERVENTION (PCI-S)  10/30/2014   Procedure: PERCUTANEOUS CORONARY STENT INTERVENTION (PCI-S);  Surgeon: Lorretta Harp, MD;  Location: [facility] CATH LAB;  Service: Cardiovascular;;   PROSTATECTOMY     THORACENTESIS Left 12/31/2021   Procedure: THORACENTESIS;  Surgeon: Candee Furbish, MD;  Location: [facility] ENDOSCOPY;  Service: Pulmonary;  Laterality: Left;   TONSILLECTOMY     VIDEO ASSISTED THORACOSCOPY (VATS)/DECORTICATION Left 01/21/2022   Procedure: VIDEO ASSISTED THORACOSCOPY (VATS)/DECORTICATION;  Surgeon: Lajuana Matte, MD;  Location: [facility] OR;  Service: Thoracic;  Laterality: Left;    REVIEW OF SYSTEMS:  A comprehensive review of systems was negative except for: Constitutional: positive for anorexia and fatigue Respiratory: positive for cough and dyspnea on exertion Musculoskeletal: positive for muscle weakness Neurological: positive for memory problems   PHYSICAL EXAMINATION: General appearance: alert, cooperative, fatigued, and no distress Head: Normocephalic, without obvious abnormality,  atraumatic Neck: no adenopathy, no JVD, supple, symmetrical, trachea midline, and thyroid not enlarged, symmetric, no tenderness/mass/nodules Lymph nodes: Cervical, supraclavicular, and axillary nodes normal. Resp: clear to auscultation bilaterally Back: symmetric, no curvature. ROM normal. No CVA tenderness. Cardio: regular rate and rhythm, S1, S2 normal, no murmur, click, rub or gallop GI: soft, non-tender; bowel sounds normal; no masses,  no organomegaly Extremities: extremities normal, atraumatic, no cyanosis or edema  ECOG PERFORMANCE STATUS: 1 - Symptomatic but completely ambulatory  Blood pressure (!) 126/96, pulse 66, temperature 97.7 F (36.5 C), temperature source Temporal, resp. rate 17, weight 124 lb 8 oz (56.5 kg), SpO2 (!) 88 %.  LABORATORY DATA: Lab Results  Component Value Date   WBC 4.4 02/24/2022   HGB 12.4 (L) 02/24/2022   HCT 38.5 (L) 02/24/2022   MCV 89.7 02/24/2022   PLT 490 (H) 02/24/2022      Chemistry  Component Value Date/Time   NA 139 02/19/2022 1032   K 3.9 02/19/2022 1032   CL 104 02/19/2022 1032   CO2 33 (H) 02/19/2022 1032   BUN 12 02/19/2022 1032   CREATININE 0.73 02/19/2022 1032      Component Value Date/Time   CALCIUM 8.7 (L) 02/19/2022 1032   ALKPHOS 102 02/19/2022 1032   AST 17 02/19/2022 1032   ALT 20 02/19/2022 1032   BILITOT 0.3 02/19/2022 1032       RADIOGRAPHIC STUDIES: NM PET Image Initial (PI) Skull Base To Thigh  Result Date: 02/10/2022 CLINICAL DATA:  Initial treatment strategy for small cell lung cancer staging. EXAM: NUCLEAR MEDICINE PET SKULL BASE TO THIGH TECHNIQUE: 6.9 mCi F-18 FDG was injected intravenously. Full-ring PET imaging was performed from the skull base to thigh after the radiotracer. CT data was obtained and used for attenuation correction and anatomic localization. Fasting blood glucose: 98 mg/dl COMPARISON:  Abdomen pelvis CT February 07, 2022. And recent CTs of the chest and abdomen and pelvis from earlier  in June. FINDINGS: Mediastinal blood pool activity: SUV max 1.99 Liver activity: SUV max NA NECK: No hypermetabolic lymph nodes in the neck. Incidental CT findings: none CHEST: Extensive circumferential pleural nodularity without substantial change based on comparison with abdominal imaging acquired on February 07, 2022 with respect to the lung bases. Improving appearance of the chest compared to the January 20, 2022 evaluation with improved aeration of LEFT upper lobe since this time following PleurX catheter placement. Extensive hypermetabolic disease about the LEFT chest in a circumferential distribution (image 44/4) 1.8 cm extrapleural nodule maximum SUV 3.7. (Image 67/4) soft tissue nodularity along the pleural surface tracking along the major fissure in the LEFT chest with a maximum SUV of 9.4, difficult to measure given adjacent consolidative changes, pleural fluid and interstitial thickening. Increased metabolic activity along the medial LEFT costodiaphragmatic sulcus with a maximum SUV of 7.2 and nodular circumferential increased metabolic activity along the inferior chest with areas of loculated pleural fluid not substantially changed in volume compared to recent CT imaging of the abdomen and pelvis with respect to the lung bases. AP window adenopathy and LEFT paratracheal adenopathy largest 16 mm (image 65/4) previously 18 mm on the recent chest CT. Maximum SUV of 2.54. No frankly hypermetabolic lymph nodes but with similar pattern of mild nodal enlargement slightly improved from the chest CT acquired on January 20, 2022. Diffuse septal thickening in areas of previously collapsed lung in the LEFT upper lobe. Incidental CT findings: Calcified coronary artery disease, three-vessel disease. Heart size moderately enlarged. Aorta is not aneurysmal. Loculated pleural fluid in the anterior LEFT chest (image 76/4) 7.2 x 5.7 cm. Again with improved aeration in the LEFT upper lobe following placement of a PleurX catheter. No  pneumothorax. Patchy atelectasis at the RIGHT lung base. ABDOMEN/PELVIS: LEFT retroperitoneal soft tissue adjacent to LEFT renal artery adjacent to LEFT diaphragmatic crus (image 117/4) maximum SUV of 2.28 not changed from recent imaging in terms of its size. Low-attenuation lesion in posterior RIGHT hepatic lobe compatible with suspected metastatic lesion appears larger than on the recent CT of February 07, 2022 and is of similar size when compared to the exam of January 17, 2022. This measures 12 mm and shows no increased metabolic activity above background liver uptake. Incidental CT findings: No acute findings relative to gallbladder, pancreas, spleen, adrenal glands or kidneys. Signs of nephrolithiasis on the RIGHT with an interpolar calculus measuring approximately 7 mm. No acute gastrointestinal findings. Colonic  diverticulosis. Post prostatectomy. Aortic atherosclerosis. No adenopathy by size criteria in the abdomen or in the pelvis. SKELETON: No focal hypermetabolic activity to suggest skeletal metastasis. Incidental CT findings: none IMPRESSION: Extensive pleural and parenchymal involvement in the LEFT chest with improvement based on comparison with imaging from earlier in June. Areas of greatest FDG uptake along the peripheral LEFT chest. Despite improved aeration of the LEFT upper chest there is extensive consolidation and there are areas of septal thickening. Background lymphangitic tumor is considered. Would also correlate with any respiratory symptoms that would suggest ongoing infection though findings are nonspecific. Mediastinal adenopathy with only mild increased metabolic activity remains concerning, this will serve as a baseline for future follow-up. Abdominal disease adjacent to the LEFT diaphragmatic crus and in the liver also without substantial metabolic activity but warranting attention on subsequent imaging. Note that the liver lesion appears larger on the current study when compared to the recent  contrasted evaluation and is of similar size when compared to the study of January 17, 2022, significance is uncertain. Post prostatectomy. Aortic Atherosclerosis (ICD10-I70.0) and Emphysema (ICD10-J43.9). Electronically Signed   By: Zetta Bills M.D.   On: 02/10/2022 14:25   MR BRAIN W WO CONTRAST  Result Date: 02/10/2022 CLINICAL DATA:  Staging examination for small cell lung cancer EXAM: MRI HEAD WITHOUT AND WITH CONTRAST TECHNIQUE: Multiplanar, multiecho pulse sequences of the brain and surrounding structures were obtained without and with intravenous contrast. CONTRAST:  54m GADAVIST GADOBUTROL 1 MMOL/ML IV SOLN COMPARISON:  None FINDINGS: Brain: Diffusion imaging does not show any acute or subacute infarction. There is generalized brain volume loss. There is no evidence metastatic disease. No hemorrhage, hydrocephalus or extra-axial collection. Vascular: Major vessels at the base of the brain show flow. Skull and upper cervical spine: Negative Sinuses/Orbits: There are some chronic inflammatory changes of the right frontal sinus. Orbits negative. Other: None IMPRESSION: No evidence of metastatic disease.  Age related volume loss. Electronically Signed   By: MNelson ChimesM.D.   On: 02/10/2022 11:53   CT ABDOMEN PELVIS W CONTRAST  Result Date: 02/07/2022 CLINICAL DATA:  Metastatic small cell lung cancer.  Constipation. EXAM: CT ABDOMEN AND PELVIS WITH CONTRAST TECHNIQUE: Multidetector CT imaging of the abdomen and pelvis was performed using the standard protocol following bolus administration of intravenous contrast. RADIATION DOSE REDUCTION: This exam was performed according to the departmental dose-optimization program which includes automated exposure control, adjustment of the mA and/or kV according to patient size and/or use of iterative reconstruction technique. CONTRAST:  1091mOMNIPAQUE IOHEXOL 300 MG/ML  SOLN COMPARISON:  CT scan 01/17/2022 FINDINGS: Lower chest: Complex loculated pleural fluid  collection at the left lung base with extensive pleural tumor. A PleurX drainage catheter is in place. Hepatobiliary: Interval decrease in size of the segment 7 hepatic lesion. Measures approximately 5 mm and previously measured 12 mm. No other lesions are identified. The gallbladder is unremarkable. No common bile duct dilatation. Pancreas: No mass, inflammation or ductal dilatation. Spleen: Normal size.  No focal lesions. Adrenals/Urinary Tract: Adrenal glands kidneys are unremarkable. Stable right renal calculus. The bladder is moderately distended. Stomach/Bowel: The stomach, duodenum, small bowel and colon are grossly normal. No acute inflammatory process, mass lesions or obstructive findings. Moderate to large stool burden noted along with stool in the rectum the suggesting constipation. Vascular/Lymphatic: Stable atherosclerotic calcifications involving the aorta and branch vessels. Stable extensive abdominal lymphadenopathy. Reproductive: The prostate gland seminal vesicles are surgically absent. Other: No ascites. Musculoskeletal: No significant bony findings. IMPRESSION:  1. Interval decrease in size of the segment 7 hepatic lesion. 2. Stable extensive upper abdominal lymphadenopathy. 3. Moderate to large stool burden along with stool in the rectum suggesting constipation. 4. Stable right renal calculus. 5. Complex loculated pleural fluid collection at the left lung base with extensive pleural tumor. A PleurX drainage catheter is in place. Aortic Atherosclerosis (ICD10-I70.0). Electronically Signed   By: Marijo Sanes M.D.   On: 02/07/2022 21:36   DG Abdomen 1 View  Result Date: 02/07/2022 CLINICAL DATA:  Constipation EXAM: ABDOMEN - 1 VIEW COMPARISON:  CT done on 01/17/2022 FINDINGS: Bowel gas pattern is nonspecific. There is no small bowel dilation. Moderate amount of stool is seen in the colon. There is no fecal impaction in the rectosigmoid. Left pleural effusion is seen. Possible left chest tube is  noted. Surgical clips are seen in the pelvis. IMPRESSION: Nonspecific bowel gas pattern. Moderate stool burden in the ascending, transverse and descending colon. There is no fecal impaction in the rectosigmoid. Electronically Signed   By: Elmer Picker M.D.   On: 02/07/2022 19:51   DG Chest 2 View  Result Date: 02/07/2022 CLINICAL DATA:  Provided history: Small-cell lung cancer, overlapping sites of left lung. Additional history provided: VATS/decortication surgery 01/21/2022. EXAM: CHEST - 2 VIEW COMPARISON:  Chest radiographs 01/22/2022 and earlier. Chest CT 01/20/2022 FINDINGS: Unchanged position of a chest tube/surgical drain, with tip projecting at the level of the left lung apex. The cardiomediastinal silhouette is stable. Aortic atherosclerosis. Persistent dense opacity throughout the left upper to mid lung field. As before, there is relatively better aeration of the left lung base, although with irregular opacities also present at this site. At least small residual left pleural effusion. No appreciable airspace consolidation within the right lung. Background emphysema. No evidence of pneumothorax. No acute bony abnormality identified. Degenerative changes of the spine. IMPRESSION: 1. No significant change from the prior chest radiograph of 01/22/2022. 2. Left-sided chest tube/surgical drain with tip projecting at the level of the left lung apex. 3. Persistent dense opacity throughout the left upper and mid lung field. 4. As before, there is relatively better aeration of the left lung base, although with irregular opacities also present at this site. These opacities may reflect atelectasis and/or consolidation. 5. At least small residual left pleural effusion. 6. Aortic Atherosclerosis (ICD10-I70.0) and Emphysema (ICD10-J43.9). Electronically Signed   By: Kellie Simmering D.O.   On: 02/07/2022 10:53    ASSESSMENT AND PLAN: This is a very pleasant 81 years old white male with Extensive stage (T3, N2,  M1 a) small cell lung cancer presented with obstructive left upper lobe lung mass in addition to mediastinal lymphadenopathy and malignant left pleural effusion as well as pleural-based metastasis and suspicious liver metastasis diagnosed in June 2023. The patient is currently undergoing systemic chemotherapy with carboplatin for AUC of 4 on day 1, etoposide 80 Mg/M2 on days 1, 2 and 3 with Imfinzi 240 Mg/M2 on the days before chemotherapy and Imfinzi 1500 Mg IV on day 1 every 3 weeks.  Status post 1 cycle.   The patient tolerated the first cycle of his treatment fairly well with no concerning adverse effects.  His condition is actually improving. I recommended for him to proceed with cycle #2 today with the same regimen and doses. I will see him back for follow-up visit in 3 weeks for evaluation with repeat CT scan of the chest, abdomen and pelvis for restaging of his disease. For the constipation, it is better.  They will continue with milk of magnesia on as-needed basis. For the pain management he is currently on oxycodone and gabapentin. For the pleural effusion, I will reach out to pulmonary medicine for consideration of removal of the left Pleurx catheter. The patient was advised to call immediately if he has any other concerning symptoms in the interval. The patient voices understanding of current disease status and treatment options and is in agreement with the current care plan.  All questions were answered. The patient knows to call the clinic with any problems, questions or concerns. We can certainly see the patient much sooner if necessary.  The total time spent in the appointment was 30 minutes.  Disclaimer: This note was dictated with voice recognition software. Similar sounding words can inadvertently be transcribed and may not be corrected upon review.

## 2022-02-24 NOTE — Patient Instructions (Signed)
[facility] ONCOLOGY  Discharge Instructions: Thank you for choosing [facility] to provide your oncology and hematology care.   If you have a lab appointment with the [facility], please go directly to the [facility] and check in at the registration area.   Wear comfortable clothing and clothing appropriate for easy access to any Portacath or PICC line.   We strive to give you quality time with your provider. You may need to reschedule your appointment if you arrive late (15 or more minutes).  Arriving late affects you and other patients whose appointments are after yours.  Also, if you miss three or more appointments without notifying the office, you may be dismissed from the clinic at the provider's discretion.      For prescription refill requests, have your pharmacy contact our office and allow 72 hours for refills to be completed.    Today you received the following chemotherapy and/or immunotherapy agents: Cosela, Imfinzi, Carboplatin, Etoposide      To help prevent nausea and vomiting after your treatment, we encourage you to take your nausea medication as directed.  BELOW ARE SYMPTOMS THAT SHOULD BE REPORTED IMMEDIATELY: *FEVER GREATER THAN 100.4 F (38 C) OR HIGHER *CHILLS OR SWEATING *NAUSEA AND VOMITING THAT IS NOT CONTROLLED WITH YOUR NAUSEA MEDICATION *UNUSUAL SHORTNESS OF BREATH *UNUSUAL BRUISING OR BLEEDING *URINARY PROBLEMS (pain or burning when urinating, or frequent urination) *BOWEL PROBLEMS (unusual diarrhea, constipation, pain near the anus) TENDERNESS IN MOUTH AND THROAT WITH OR WITHOUT PRESENCE OF ULCERS (sore throat, sores in mouth, or a toothache) UNUSUAL RASH, SWELLING OR PAIN  UNUSUAL VAGINAL DISCHARGE OR ITCHING   Items with * indicate a potential emergency and should be followed up as soon as possible or go to the Emergency Department if any problems should occur.  Please show the CHEMOTHERAPY ALERT CARD or  IMMUNOTHERAPY ALERT CARD at check-in to the Emergency Department and triage nurse.  Should you have questions after your visit or need to cancel or reschedule your appointment, please contact [facility]  Dept: [phone]  and follow the prompts.  Office hours are 8:00 a.m. to 4:30 p.m. Monday - Friday. Please note that voicemails left after 4:00 p.m. may not be returned until the following business day.  We are closed weekends and major holidays. You have access to a nurse at all times for urgent questions. Please call the main number to the clinic Dept: [phone] and follow the prompts.   For any non-urgent questions, you may also contact your provider using MyChart. We now offer e-Visits for anyone 35 and older to request care online for non-urgent symptoms. For details visit [email or web address].   Also download the MyChart app! Go to the app store, search "MyChart", open the app, select [facility], and log in with your MyChart username and password.  Masks are optional in the cancer centers. If you would like for your care team to wear a mask while they are taking care of you, please let them know. For doctor visits, patients may have with them one support person who is at least 81 years old. At this time, visitors are not allowed in the infusion area.

## 2022-02-25 ENCOUNTER — Ambulatory Visit (HOSPITAL_COMMUNITY): Payer: Medicare Other | Attending: Cardiovascular Disease

## 2022-02-25 ENCOUNTER — Inpatient Hospital Stay: Payer: Medicare Other | Admitting: Dietician

## 2022-02-25 ENCOUNTER — Inpatient Hospital Stay: Payer: Medicare Other

## 2022-02-25 VITALS — BP 107/46 | HR 68 | Temp 97.7°F | Resp 18

## 2022-02-25 DIAGNOSIS — C3482 Malignant neoplasm of overlapping sites of left bronchus and lung: Secondary | ICD-10-CM

## 2022-02-25 DIAGNOSIS — M7989 Other specified soft tissue disorders: Secondary | ICD-10-CM | POA: Diagnosis not present

## 2022-02-25 DIAGNOSIS — I5022 Chronic systolic (congestive) heart failure: Secondary | ICD-10-CM | POA: Diagnosis not present

## 2022-02-25 LAB — ECHOCARDIOGRAM COMPLETE
Area-P 1/2: 2.94 cm2
MV VTI: 1.49 cm2
S' Lateral: 3.4 cm

## 2022-02-25 MED ORDER — TRILACICLIB DIHYDROCHLORIDE INJECTION 300 MG
240.0000 mg/m2 | Freq: Once | INTRAVENOUS | Status: AC
  Administered 2022-02-25: 405 mg via INTRAVENOUS
  Filled 2022-02-25: qty 27

## 2022-02-25 MED ORDER — SODIUM CHLORIDE 0.9 % IV SOLN
80.0000 mg/m2 | Freq: Once | INTRAVENOUS | Status: AC
  Administered 2022-02-25: 130 mg via INTRAVENOUS
  Filled 2022-02-25: qty 6.5

## 2022-02-25 MED ORDER — SODIUM CHLORIDE 0.9 % IV SOLN
10.0000 mg | Freq: Once | INTRAVENOUS | Status: AC
  Administered 2022-02-25: 10 mg via INTRAVENOUS
  Filled 2022-02-25: qty 10

## 2022-02-25 MED ORDER — SODIUM CHLORIDE 0.9 % IV SOLN: Freq: Once | INTRAVENOUS | Status: AC

## 2022-02-25 MED FILL — Dexamethasone Sodium Phosphate Inj 100 MG/10ML: INTRAMUSCULAR | Qty: 1 | Status: AC

## 2022-02-25 NOTE — Progress Notes (Signed)
Nutrition Assessment   Reason for Assessment: MST   ASSESSMENT: 81 year old male with extensive stage small cell lung cancer.   Past medical history includes STEMI, ischemic cardiomyopathy, CAD s/p coronary angioplasty, afib with RVR, pleural effusion on left, HLD, Dressler's syndrome, gout  Met with patient in infusion. Brother-in-law Data processing manager) at bedside today. Patient reports appetite has not been great. Per [facility], yesterday patient had Ensure and couple bags of chips while receiving treatment, 6" pizza from [employer], and piece of pie. He usually drinks 2 Ensure daily (unsure of what kind). Patient reports working to drink more water, states he "has a water pusher at home" He likes apple juice. Patient does not care much for milk, eggs, or peanut butter. He denies nausea, vomiting, diarrhea. Patient endorses constipation after first chemotherapy, says he did not have a BM for 12 days. This was resolved with milk of magnesia. His last "good" bowel movement was on Sunday. Patient is not taking stool softener.   Nutrition Focused Physical Exam:   Orbital Region: severe Buccal Region: severe Upper Arm Region: UTA (wearing button down collared shirt, jeans) Thoracic and Lumbar Region: UTA Temple Region: moderate Clavicle Bone Region: UTA Shoulder and Acromion Bone Region: UTA Scapular Bone Region: UTA Dorsal Hand: severe Patellar Region: UTA Anterior Thigh Region: UTA Posterior Calf Region: UTA Edema (RD assessment): UTA Hair: reviewed  Eyes: reviewed Mouth: reviewed  Skin: reviewed Nails: reviewed (yellow)    Medications: D3, vitC, coreg, roxicodone, compazine   Labs: K 5.2, glucose 102   Anthropometrics: Weights have decreased 11% (16 lbs) in 2 months; this is severe   Height: 5'9" Weight: 124 lb 8 oz  UBW: 150 lb (per family prior to diagnosis) BMI: 18.39 (underweight)  5/10 - 140 lb 9.6 oz 6/5 - 135 lb 14.4 oz  6/27 - 128 lb 3.2 oz   NUTRITION DIAGNOSIS: Patient  meets criteria for severe malnutrition in the context of chronic illness (extensive small cell lung cancer) as evidenced by moderate/severe fat and muscle depletion, poor appetite meeting less than 75% of needs per recall, significant 11% weight loss in 2 months  INTERVENTION:  Encouraged small frequent meals/snacks with adequate calories and protein q2-3 hours - handout with snack ideas provided Suggested soft moist high protein foods for ease of intake - handout with ideas + shake recipes provided Continue drinking Ensure, suggested Ensure Plus/equivalent 2-3/day - coupons provided Discussed strategies for constipation, suggested pt could try 4oz prune juice as well as stool softener - handout with tips provided Contact information given  MONITORING, EVALUATION, GOAL: patient will tolerate increased calories and protein to minimize further weight loss    Next Visit: Tuesday August 1 during infusion

## 2022-02-25 NOTE — Patient Instructions (Signed)
[facility] ONCOLOGY  Discharge Instructions: Thank you for choosing [facility] to provide your oncology and hematology care.   If you have a lab appointment with the [facility], please go directly to the [facility] and check in at the registration area.   Wear comfortable clothing and clothing appropriate for easy access to any Portacath or PICC line.   We strive to give you quality time with your provider. You may need to reschedule your appointment if you arrive late (15 or more minutes).  Arriving late affects you and other patients whose appointments are after yours.  Also, if you miss three or more appointments without notifying the office, you may be dismissed from the clinic at the provider's discretion.      For prescription refill requests, have your pharmacy contact our office and allow 72 hours for refills to be completed.    Today you received the following chemotherapy and/or immunotherapy agents: etoposide      To help prevent nausea and vomiting after your treatment, we encourage you to take your nausea medication as directed.  BELOW ARE SYMPTOMS THAT SHOULD BE REPORTED IMMEDIATELY: *FEVER GREATER THAN 100.4 F (38 C) OR HIGHER *CHILLS OR SWEATING *NAUSEA AND VOMITING THAT IS NOT CONTROLLED WITH YOUR NAUSEA MEDICATION *UNUSUAL SHORTNESS OF BREATH *UNUSUAL BRUISING OR BLEEDING *URINARY PROBLEMS (pain or burning when urinating, or frequent urination) *BOWEL PROBLEMS (unusual diarrhea, constipation, pain near the anus) TENDERNESS IN MOUTH AND THROAT WITH OR WITHOUT PRESENCE OF ULCERS (sore throat, sores in mouth, or a toothache) UNUSUAL RASH, SWELLING OR PAIN  UNUSUAL VAGINAL DISCHARGE OR ITCHING   Items with * indicate a potential emergency and should be followed up as soon as possible or go to the Emergency Department if any problems should occur.  Please show the CHEMOTHERAPY ALERT CARD or IMMUNOTHERAPY ALERT CARD at check-in to  the Emergency Department and triage nurse.  Should you have questions after your visit or need to cancel or reschedule your appointment, please contact [facility]  Dept: [phone]  and follow the prompts.  Office hours are 8:00 a.m. to 4:30 p.m. Monday - Friday. Please note that voicemails left after 4:00 p.m. may not be returned until the following business day.  We are closed weekends and major holidays. You have access to a nurse at all times for urgent questions. Please call the main number to the clinic Dept: [phone] and follow the prompts.   For any non-urgent questions, you may also contact your provider using MyChart. We now offer e-Visits for anyone 66 and older to request care online for non-urgent symptoms. For details visit [email or web address].   Also download the MyChart app! Go to the app store, search "MyChart", open the app, select [facility], and log in with your MyChart username and password.  Masks are optional in the cancer centers. If you would like for your care team to wear a mask while they are taking care of you, please let them know. For doctor visits, patients may have with them one support person who is at least 81 years old. At this time, visitors are not allowed in the infusion area.

## 2022-02-26 ENCOUNTER — Telehealth: Payer: Self-pay | Admitting: Cardiovascular Disease

## 2022-02-26 ENCOUNTER — Other Ambulatory Visit: Payer: Self-pay

## 2022-02-26 ENCOUNTER — Telehealth: Payer: Self-pay | Admitting: Medical Oncology

## 2022-02-26 ENCOUNTER — Telehealth: Payer: Self-pay

## 2022-02-26 ENCOUNTER — Inpatient Hospital Stay: Payer: Medicare Other

## 2022-02-26 VITALS — BP 118/56 | HR 68 | Temp 98.1°F | Resp 18

## 2022-02-26 DIAGNOSIS — Z5112 Encounter for antineoplastic immunotherapy: Secondary | ICD-10-CM | POA: Diagnosis not present

## 2022-02-26 DIAGNOSIS — R4689 Other symptoms and signs involving appearance and behavior: Secondary | ICD-10-CM | POA: Diagnosis not present

## 2022-02-26 DIAGNOSIS — C3482 Malignant neoplasm of overlapping sites of left bronchus and lung: Secondary | ICD-10-CM | POA: Diagnosis not present

## 2022-02-26 DIAGNOSIS — E860 Dehydration: Secondary | ICD-10-CM | POA: Diagnosis not present

## 2022-02-26 DIAGNOSIS — Z5111 Encounter for antineoplastic chemotherapy: Secondary | ICD-10-CM | POA: Diagnosis not present

## 2022-02-26 DIAGNOSIS — C782 Secondary malignant neoplasm of pleura: Secondary | ICD-10-CM | POA: Diagnosis not present

## 2022-02-26 MED ORDER — TRILACICLIB DIHYDROCHLORIDE INJECTION 300 MG
240.0000 mg/m2 | Freq: Once | INTRAVENOUS | Status: AC
  Administered 2022-02-26: 405 mg via INTRAVENOUS
  Filled 2022-02-26: qty 27

## 2022-02-26 MED ORDER — SODIUM CHLORIDE 0.9 % IV SOLN
80.0000 mg/m2 | Freq: Once | INTRAVENOUS | Status: AC
  Administered 2022-02-26: 130 mg via INTRAVENOUS
  Filled 2022-02-26: qty 6.5

## 2022-02-26 MED ORDER — SODIUM CHLORIDE 0.9 % IV SOLN: Freq: Once | INTRAVENOUS | Status: DC

## 2022-02-26 MED ORDER — SODIUM CHLORIDE 0.9 % IV SOLN
10.0000 mg | Freq: Once | INTRAVENOUS | Status: AC
  Administered 2022-02-26: 10 mg via INTRAVENOUS
  Filled 2022-02-26: qty 10

## 2022-02-26 MED ORDER — DEXTROSE 5 % IV SOLN: INTRAVENOUS | Status: DC

## 2022-02-26 NOTE — Telephone Encounter (Signed)
Pt is scheduled with Diona Browner DNP tomorrow and needs to r/s appointment. Diona Browner DNP will be out of the office tomorrow. Waiting on a return call to r/s apt.

## 2022-02-26 NOTE — Telephone Encounter (Signed)
   Pre-operative Risk Assessment    Patient Name: Pedro Pope.  DOB: 1941-03-06 MRN: [id number]      Request for Surgical Clearance    Procedure:   Center Line (portacath)  Date of Surgery:  Clearance TBD                                 Surgeon:  Dr. Fulton Mole Group or Practice Name:  [facility] Phone number:  [phone] Fax number:  [phone]   Type of Clearance Requested:   - Medical    Type of Anesthesia:   unsure   Additional requests/questions:      SignedMilbert Coulter   02/26/2022, 11:33 AM

## 2022-02-26 NOTE — Telephone Encounter (Signed)
Patient has an appointment with Diona Browner, NP on 02/27/22. I have routed surgical clearance request to the provider and made documentation in appointment notes.   Emmaline Life, NP-C    02/26/2022, 1:00 PM [facility] [address], [address] Office [phone] Fax [phone]

## 2022-02-26 NOTE — Telephone Encounter (Signed)
Poor venous access and scheduled to receive a vesicant.   Port a cath needed for vesicant administration and poor peripheral venous access. Cardiology clearance requested by pt. (  Dr Gwenlyn Found ). Dr Kennon Holter office contacted for cardiology clearance per pt request.

## 2022-02-26 NOTE — Patient Instructions (Signed)
[facility] ONCOLOGY  Discharge Instructions: Thank you for choosing [facility] to provide your oncology and hematology care.   If you have a lab appointment with the [facility], please go directly to the [facility] and check in at the registration area.   Wear comfortable clothing and clothing appropriate for easy access to any Portacath or PICC line.   We strive to give you quality time with your provider. You may need to reschedule your appointment if you arrive late (15 or more minutes).  Arriving late affects you and other patients whose appointments are after yours.  Also, if you miss three or more appointments without notifying the office, you may be dismissed from the clinic at the provider's discretion.      For prescription refill requests, have your pharmacy contact our office and allow 72 hours for refills to be completed.    Today you received the following chemotherapy and/or immunotherapy agents : etoposide      To help prevent nausea and vomiting after your treatment, we encourage you to take your nausea medication as directed.  BELOW ARE SYMPTOMS THAT SHOULD BE REPORTED IMMEDIATELY: *FEVER GREATER THAN 100.4 F (38 C) OR HIGHER *CHILLS OR SWEATING *NAUSEA AND VOMITING THAT IS NOT CONTROLLED WITH YOUR NAUSEA MEDICATION *UNUSUAL SHORTNESS OF BREATH *UNUSUAL BRUISING OR BLEEDING *URINARY PROBLEMS (pain or burning when urinating, or frequent urination) *BOWEL PROBLEMS (unusual diarrhea, constipation, pain near the anus) TENDERNESS IN MOUTH AND THROAT WITH OR WITHOUT PRESENCE OF ULCERS (sore throat, sores in mouth, or a toothache) UNUSUAL RASH, SWELLING OR PAIN  UNUSUAL VAGINAL DISCHARGE OR ITCHING   Items with * indicate a potential emergency and should be followed up as soon as possible or go to the Emergency Department if any problems should occur.  Please show the CHEMOTHERAPY ALERT CARD or IMMUNOTHERAPY ALERT CARD at check-in to  the Emergency Department and triage nurse.  Should you have questions after your visit or need to cancel or reschedule your appointment, please contact [facility]  Dept: [phone]  and follow the prompts.  Office hours are 8:00 a.m. to 4:30 p.m. Monday - Friday. Please note that voicemails left after 4:00 p.m. may not be returned until the following business day.  We are closed weekends and major holidays. You have access to a nurse at all times for urgent questions. Please call the main number to the clinic Dept: [phone] and follow the prompts.   For any non-urgent questions, you may also contact your provider using MyChart. We now offer e-Visits for anyone 23 and older to request care online for non-urgent symptoms. For details visit [email or web address].   Also download the MyChart app! Go to the app store, search "MyChart", open the app, select , and log in with your MyChart username and password.  Masks are optional in the cancer centers. If you would like for your care team to wear a mask while they are taking care of you, please let them know. For doctor visits, patients may have with them one support person who is at least 81 years old. At this time, visitors are not allowed in the infusion area.

## 2022-02-27 ENCOUNTER — Ambulatory Visit: Payer: Medicare Other | Admitting: Nurse Practitioner

## 2022-02-27 DIAGNOSIS — Z4801 Encounter for change or removal of surgical wound dressing: Secondary | ICD-10-CM | POA: Diagnosis not present

## 2022-02-27 DIAGNOSIS — C3490 Malignant neoplasm of unspecified part of unspecified bronchus or lung: Secondary | ICD-10-CM | POA: Diagnosis not present

## 2022-02-27 DIAGNOSIS — J942 Hemothorax: Secondary | ICD-10-CM | POA: Diagnosis not present

## 2022-02-27 DIAGNOSIS — J9601 Acute respiratory failure with hypoxia: Secondary | ICD-10-CM | POA: Diagnosis not present

## 2022-02-27 DIAGNOSIS — Z48813 Encounter for surgical aftercare following surgery on the respiratory system: Secondary | ICD-10-CM | POA: Diagnosis not present

## 2022-02-27 DIAGNOSIS — J910 Malignant pleural effusion: Secondary | ICD-10-CM | POA: Diagnosis not present

## 2022-02-27 NOTE — Progress Notes (Deleted)
      [address]       [place] [address]             [place] Jr. Saunders Record #[id number] Date of Birth: Nov 23, 1940  Referring: Shon Baton, MD Primary Care: Shon Baton, MD Primary Cardiologist:Jonathan Gwenlyn Found, MD  Reason for visit:   follow-up  History of Present Illness:     ***  Physical Exam: There were no vitals taken for this visit.  Alert NAD Abdomen, ND no peripheral edema   Diagnostic Studies & Laboratory data: CXR: ***     Assessment / Plan:   81 year old male status post left VATS pleural biopsy for small cell lung cancer.  A Pleurx catheter was placed.  He has had decreasing drainage since starting his chemotherapy.  We will continue to drain this on a daily basis until it is under 200 mL on 2 consecutive therapies.     Lajuana Matte 02/27/2022 1:44 PM

## 2022-02-28 ENCOUNTER — Ambulatory Visit: Payer: Medicare Other

## 2022-02-28 ENCOUNTER — Ambulatory Visit: Payer: Self-pay | Admitting: Thoracic Surgery (Cardiothoracic Vascular Surgery)

## 2022-02-28 DIAGNOSIS — Z4801 Encounter for change or removal of surgical wound dressing: Secondary | ICD-10-CM | POA: Diagnosis not present

## 2022-02-28 DIAGNOSIS — J9601 Acute respiratory failure with hypoxia: Secondary | ICD-10-CM | POA: Diagnosis not present

## 2022-02-28 DIAGNOSIS — C3490 Malignant neoplasm of unspecified part of unspecified bronchus or lung: Secondary | ICD-10-CM | POA: Diagnosis not present

## 2022-02-28 DIAGNOSIS — Z48813 Encounter for surgical aftercare following surgery on the respiratory system: Secondary | ICD-10-CM | POA: Diagnosis not present

## 2022-02-28 DIAGNOSIS — J910 Malignant pleural effusion: Secondary | ICD-10-CM | POA: Diagnosis not present

## 2022-02-28 DIAGNOSIS — J942 Hemothorax: Secondary | ICD-10-CM | POA: Diagnosis not present

## 2022-03-02 NOTE — Progress Notes (Deleted)
Office Visit    Patient Name: Pedro Pope. Date of Encounter: 03/02/2022  PCP:  Shon Baton, [place]  Cardiologist:  Quay Burow, MD  Advanced Practice Provider:  No care team member to display Electrophysiologist:  None      Chief Complaint    Pedro Frontera. is a 81 y.o. male presents today for preop clearance  Past Medical History    Past Medical History:  Diagnosis Date   Anosmia    CAD (coronary artery disease), native coronary artery    a. 10/29/2013 antlat STEMI s/p DES to pLAD; residual 80 % proximal and 60% mid LCx diease   Chronic headaches    Dressler's syndrome ([facility])    a. placed on colchicine    ED (erectile dysfunction)    Elevated TSH    Fatty liver    Gout    Ischemic cardiomyopathy    a. 2D ECHO 11/04/14 w/ EF 40-45% with LV WMA, G2DD, mild MR, mild LA dilation, mod TR.   PAF (paroxysmal atrial fibrillation) ([facility])    a. Dx on 11/03/14 admission. Not placed on longterm AC due to DAPT w/ receent DES. Will plan for outpt heart monitor    Pre-diabetes    a. HgA1c 6.1 in 10/2014   Prostate cancer [facility])    s/p prostatectomy and XRT   Rheumatic fever 1947   Past Surgical History:  Procedure Laterality Date   CORONARY ANGIOPLASTY WITH STENT PLACEMENT  10/30/14   STEMI s/p DES to proximal LAD; residual 80 % proximal and 60% mid LCx diease   LEFT HEART CATHETERIZATION WITH CORONARY ANGIOGRAM N/A 10/30/2014   Procedure: LEFT HEART CATHETERIZATION WITH CORONARY ANGIOGRAM;  Surgeon: Lorretta Harp, MD;  Location[facility] C[facility] LAB;  Service: Cardiovascular;  Laterality: N/A;   PERCUTANEOUS CORONARY STENT INTERVENTION (PCI-S)  10/30/2014   Procedure: PERCUTANEOUS CORONARY STENT INTERVENTION (PCI-S);  Surgeon: Lorretta Harp, MD;  Location: [facility] CATH LAB;  Service: Cardiovascular;;   PROSTATECTOMY     THORACENTESIS Left 12/31/2021   Procedure: THORACENTESIS;  Surgeon: Candee Furbish, MD;  Location: [facility] ENDOSCOPY;  Service:  Pulmonary;  Laterality: Left;   TONSILLECTOMY     VIDEO ASSISTED THORACOSCOPY (VATS)/DECORTICATION Left 01/21/2022   Procedure: VIDEO ASSISTED THORACOSCOPY (VATS)/DECORTICATION;  Surgeon: Lajuana Matte, MD;  Location: [facility];  Service: Thoracic;  Laterality: Left;    Allergies  Allergies  Allergen Reactions   Penicillins Anaphylaxis   Corn Syrup [Glucose] Other (See Comments)    High fructose corn syrup causes gout   Aspirin Adult Low [Aspirin] Itching and Rash    Dr. [name] suspects that long term caused a rash, short term use appears to be ok    History of Present Illness    Pedro Pope. is a 81 y.o. male with a hx of CAD (s/p NSTEMI DES-pLAD 2016) HFrEF, ICM, PAF, lung cancer with left pleural effusion s/p VATS 01/2022, hemothorax, Dressler syndrome, prediabetes, prostate cancer s/p prostatectomy and radiation, fatty liver, gout last seen 02/07/2022.  Hospitalized in 2016 in setting of NSTEMI with DES-pLAD.  EF 40-45%.  ED visit several days following discharge with A-fib with RVR and was symptomatic.  Not started on [facility] due to recent PCI, risk for bleeding.  Did not tolerate beta-blocker due to bradycardia, hypotension.  Evaluated with Dr. Rayann Heman for consideration of ICD therapy.  Ultimately placed on Xarelto.  Myoview 01-2015-scar in LAD territory.  Echo at that time EF 30-35%.  Echo 04/2015 EF 40-45%.   Has followed routinely with cardiology.  Multiple courses of antibiotics for pneumonia April 2023.  CT chest May 2023 with moderate loculated pleural effusion.  Underwent thoracentesis per pulmonology 12/31/2021.  ED visit 01/17/2022 shortness of breath with large left pleural effusion with CT concerning for pleural metastatic disease.  Hospitalized 6/2 - 01/23/2022 with A-fib treated with IV diltiazem.  Underwent VATS, pleural biopsy, placement of Pleurx catheter.  Pathology consistent with small cell carcinoma.  Statin discontinued given no further mortality benefit.  Seen in clinic  02/07/2022 noting lower extremity edema.  He was provided Lasix 20 mg daily as needed for swelling.  Cautious use given relative hypotension.  Echocardiogram Evan/11/23 with EF 45-50%, mid apical-anteroseptal akinesis, apical hypokinesis, grade 1 diastolic dysfunction, RV normal size and function, trivial MR.  Wall motion abnormalities similar to prior in 2016.  He presents today for follow-up. ***  He presents today for preoperative clearance for Port-A-Cath placement. ***   EKGs/Labs/Other Studies Reviewed:   The following studies were reviewed today: Echo 02/25/2022   1. Mid-apical anteroseptal akinesis. Apical hypokinesis. Left ventricular  ejection fraction, by estimation, is 45 to 50%. The left ventricle has  mildly decreased function. The left ventricle demonstrates regional wall  motion abnormalities (see scoring  diagram/findings for description). Left ventricular diastolic parameters  are consistent with Grade I diastolic dysfunction (impaired relaxation).  The average left ventricular global longitudinal strain is -14.8 %. The  global longitudinal strain is  abnormal.   2. Right ventricular systolic function is normal. The right ventricular  size is normal. There is normal pulmonary artery systolic pressure.   3. The mitral valve is normal in structure. Trivial mitral valve  regurgitation. No evidence of mitral stenosis.   4. The aortic valve is normal in structure. Aortic valve regurgitation is  not visualized. No aortic stenosis is present.   5. The inferior vena cava is normal in size with greater than 50%  respiratory variability, suggesting right atrial pressure of 3 mmHg.   EKG: No EKG today.  Recent Labs: 01/17/2022: B Natriuretic Peptide 192.2 02/24/2022: ALT 14; BUN 13; Creatinine 0.74; Hemoglobin 12.4; Platelet Count 490; Potassium 5.2; Sodium 137  Recent Lipid Panel    Component Value Date/Time   CHOL 74 01/16/2015 0927   TRIG 99 01/16/2015 0927   HDL 31 (L)  01/16/2015 0927   CHOLHDL 2.4 01/16/2015 0927   VLDL 20 01/16/2015 0927   LDLCALC 23 01/16/2015 0927    Home Medications   No outpatient medications have been marked as taking for the 03/03/22 encounter (Appointment) with Loel Dubonnet, NP.     Review of Systems      All other systems reviewed and are otherwise negative except as noted above.  Physical Exam    VS:  There were no vitals taken for this visit. , BMI There is no height or weight on file to calculate BMI.  Wt Readings from Last 3 Encounters:  02/24/22 124 lb 8 oz (56.5 kg)  02/19/22 125 lb 1.6 oz (56.7 kg)  02/11/22 128 lb 3.2 oz (58.2 kg)     GEN: Well nourished, well developed, in no acute distress. HEENT: normal. Neck: Supple, no JVD, carotid bruits, or masses. Cardiac: ***RRR, no murmurs, rubs, or gallops. No clubbing, cyanosis, edema.  ***Radials/PT 2+ and equal bilaterally.  Respiratory:  ***Respirations regular and unlabored, clear to auscultation bilaterally. GI: Soft, nontender, nondistended. MS: No deformity or atrophy. Skin: Warm and dry, no rash.  Neuro:  Strength and sensation are intact. Psych: Normal affect.  Assessment & Plan     Preop clearance -   CAD -2016 DES-pLAD.***  HFrEF /ICM -2016 EF 30 to 35% in setting of STEMI.  Echo 02/25/2022 EF 72-53%, grade 1 diastolic dysfunction. ***  PAF -no OAC due to bleeding risk.***  HLD, LDL goal less than 70 -no statin presently due to lack of mortality benefit.  Lung cancer -s/p VATS 01/2022.  Pathology small cell carcinoma.  Follows doing with hematology.     Disposition: Follow up {follow up:15908} with Quay Burow, MD or APP.  Signed, Loel Dubonnet, NP 03/02/2022, 2:47 PM [facility]

## 2022-03-03 ENCOUNTER — Ambulatory Visit (HOSPITAL_BASED_OUTPATIENT_CLINIC_OR_DEPARTMENT_OTHER): Payer: Medicare Other | Admitting: Family

## 2022-03-03 NOTE — Telephone Encounter (Signed)
Patient scheduled for appointment with Laurann Montana, NP on 03/03/2022 to discuss preop clearance. Thank you. -EM  Spoke with pt and his son. They were notified of lab results and recommendations.

## 2022-03-03 NOTE — Telephone Encounter (Signed)
   Name: Pedro Pope.  DOB: [birth year]  MRN: [id number]   Primary Cardiologist: Quay Burow, MD  Chart reviewed as part of pre-operative protocol coverage. Patient was contacted 03/03/2022 in reference to pre-operative risk assessment for pending surgery as outlined below.  Pedro [employer]. was last seen on 02/07/22 by Diona Browner, NP. He was started on lasix 20mg  PRN for LE edema and recommended for echocardiogram. Echocardiogram 02/25/22 LVEF 45-50%, WMA, gr1DD, RV normal size and function, trivial MR. Similarly to previous 2016.   Spoke with Pedro Pope, his son, per DPR.    Since that day, Pedro Carreira. has done well. He has only had to take one of the Lasix pills. They have been keeping feet up regularly. Blood pressure has been staying 114/56. He is not experiencing lightheadedness or dizziness.  As such, have rescheduled him for a 3 mos f/u with Dr. Gwenlyn Found.  Therefore, based on ACC/AHA guidelines, the patient would be at acceptable risk for the planned procedure without further cardiovascular testing.   The patient was advised that if he develops new symptoms prior to surgery to contact our office to arrange for a follow-up visit, and he verbalized understanding.  I will route this recommendation to the requesting party via Epic fax function and remove from pre-op pool. Please call with questions.  Loel Dubonnet, NP 03/03/2022, 4:25 PM

## 2022-03-04 ENCOUNTER — Other Ambulatory Visit: Payer: Self-pay

## 2022-03-04 ENCOUNTER — Inpatient Hospital Stay: Payer: Medicare Other

## 2022-03-04 DIAGNOSIS — E860 Dehydration: Secondary | ICD-10-CM | POA: Diagnosis not present

## 2022-03-04 DIAGNOSIS — Z5112 Encounter for antineoplastic immunotherapy: Secondary | ICD-10-CM | POA: Diagnosis not present

## 2022-03-04 DIAGNOSIS — C3482 Malignant neoplasm of overlapping sites of left bronchus and lung: Secondary | ICD-10-CM | POA: Diagnosis not present

## 2022-03-04 DIAGNOSIS — C3490 Malignant neoplasm of unspecified part of unspecified bronchus or lung: Secondary | ICD-10-CM

## 2022-03-04 DIAGNOSIS — Z5111 Encounter for antineoplastic chemotherapy: Secondary | ICD-10-CM | POA: Diagnosis not present

## 2022-03-04 DIAGNOSIS — C782 Secondary malignant neoplasm of pleura: Secondary | ICD-10-CM | POA: Diagnosis not present

## 2022-03-04 DIAGNOSIS — R4689 Other symptoms and signs involving appearance and behavior: Secondary | ICD-10-CM | POA: Diagnosis not present

## 2022-03-04 LAB — CMP (CANCER CENTER ONLY)
ALT: 11 U/L (ref 0–44)
AST: 19 U/L (ref 15–41)
Albumin: 3 g/dL — ABNORMAL LOW (ref 3.5–5.0)
Alkaline Phosphatase: 88 U/L (ref 38–126)
Anion gap: 3 — ABNORMAL LOW (ref 5–15)
BUN: 12 mg/dL (ref 8–23)
CO2: 33 mmol/L — ABNORMAL HIGH (ref 22–32)
Calcium: 8.9 mg/dL (ref 8.9–10.3)
Chloride: 105 mmol/L (ref 98–111)
Creatinine: 0.67 mg/dL (ref 0.61–1.24)
GFR, Estimated: >60 mL/min (ref >60)
Glucose, Bld: 126 mg/dL — ABNORMAL HIGH (ref 70–99)
Potassium: 4.9 mmol/L (ref 3.5–5.1)
Sodium: 141 mmol/L (ref 135–145)
Total Bilirubin: 0.4 mg/dL (ref 0.3–1.2)
Total Protein: 5.7 g/dL — ABNORMAL LOW (ref 6.5–8.1)

## 2022-03-04 LAB — CBC WITH DIFFERENTIAL (CANCER CENTER ONLY)
Abs Immature Granulocytes: 0.05 10*3/uL (ref 0.00–0.07)
Basophils Absolute: 0 10*3/uL (ref 0.0–0.1)
Basophils Relative: 1 %
Eosinophils Absolute: 0 10*3/uL (ref 0.0–0.5)
Eosinophils Relative: 1 %
HCT: 35.1 % — ABNORMAL LOW (ref 39.0–52.0)
Hemoglobin: 11.1 g/dL — ABNORMAL LOW (ref 13.0–17.0)
Immature Granulocytes: 1 %
Lymphocytes Relative: 34 %
Lymphs Abs: 1.4 10*3/uL (ref 0.7–4.0)
MCH: 28.8 pg (ref 26.0–34.0)
MCHC: 31.6 g/dL (ref 30.0–36.0)
MCV: 91.2 fL (ref 80.0–100.0)
Monocytes Absolute: 0.2 10*3/uL (ref 0.1–1.0)
Monocytes Relative: 5 %
Neutro Abs: 2.4 10*3/uL (ref 1.7–7.7)
Neutrophils Relative %: 58 %
Platelet Count: 363 10*3/uL (ref 150–400)
RBC: 3.85 MIL/uL — ABNORMAL LOW (ref 4.22–5.81)
RDW: 15.4 % (ref 11.5–15.5)
WBC Count: 4.1 10*3/uL (ref 4.0–10.5)
nRBC: 0 % (ref 0.0–0.2)

## 2022-03-05 ENCOUNTER — Telehealth: Payer: Self-pay | Admitting: *Deleted

## 2022-03-05 DIAGNOSIS — C3482 Malignant neoplasm of overlapping sites of left bronchus and lung: Secondary | ICD-10-CM

## 2022-03-05 NOTE — Telephone Encounter (Signed)
Notified son that pt was cleared by cardiology for port placement. Order placed

## 2022-03-05 NOTE — Telephone Encounter (Signed)
Received fax from Dr Gwenlyn Found, cardiologist stating Mr Pedro Pope is "cleared for port"

## 2022-03-06 DIAGNOSIS — C3490 Malignant neoplasm of unspecified part of unspecified bronchus or lung: Secondary | ICD-10-CM | POA: Diagnosis not present

## 2022-03-06 DIAGNOSIS — J942 Hemothorax: Secondary | ICD-10-CM | POA: Diagnosis not present

## 2022-03-06 DIAGNOSIS — Z4801 Encounter for change or removal of surgical wound dressing: Secondary | ICD-10-CM | POA: Diagnosis not present

## 2022-03-06 DIAGNOSIS — J910 Malignant pleural effusion: Secondary | ICD-10-CM | POA: Diagnosis not present

## 2022-03-06 DIAGNOSIS — Z48813 Encounter for surgical aftercare following surgery on the respiratory system: Secondary | ICD-10-CM | POA: Diagnosis not present

## 2022-03-06 DIAGNOSIS — J9601 Acute respiratory failure with hypoxia: Secondary | ICD-10-CM | POA: Diagnosis not present

## 2022-03-07 DIAGNOSIS — C3490 Malignant neoplasm of unspecified part of unspecified bronchus or lung: Secondary | ICD-10-CM | POA: Diagnosis not present

## 2022-03-07 DIAGNOSIS — J942 Hemothorax: Secondary | ICD-10-CM | POA: Diagnosis not present

## 2022-03-07 DIAGNOSIS — Z4801 Encounter for change or removal of surgical wound dressing: Secondary | ICD-10-CM | POA: Diagnosis not present

## 2022-03-07 DIAGNOSIS — Z48813 Encounter for surgical aftercare following surgery on the respiratory system: Secondary | ICD-10-CM | POA: Diagnosis not present

## 2022-03-07 DIAGNOSIS — J910 Malignant pleural effusion: Secondary | ICD-10-CM | POA: Diagnosis not present

## 2022-03-07 DIAGNOSIS — J9601 Acute respiratory failure with hypoxia: Secondary | ICD-10-CM | POA: Diagnosis not present

## 2022-03-10 ENCOUNTER — Other Ambulatory Visit: Payer: Self-pay

## 2022-03-10 DIAGNOSIS — J910 Malignant pleural effusion: Secondary | ICD-10-CM | POA: Diagnosis not present

## 2022-03-10 DIAGNOSIS — J9601 Acute respiratory failure with hypoxia: Secondary | ICD-10-CM | POA: Diagnosis not present

## 2022-03-10 DIAGNOSIS — Z48813 Encounter for surgical aftercare following surgery on the respiratory system: Secondary | ICD-10-CM | POA: Diagnosis not present

## 2022-03-10 DIAGNOSIS — C3490 Malignant neoplasm of unspecified part of unspecified bronchus or lung: Secondary | ICD-10-CM | POA: Diagnosis not present

## 2022-03-10 DIAGNOSIS — J942 Hemothorax: Secondary | ICD-10-CM | POA: Diagnosis not present

## 2022-03-10 DIAGNOSIS — Z4801 Encounter for change or removal of surgical wound dressing: Secondary | ICD-10-CM | POA: Diagnosis not present

## 2022-03-11 ENCOUNTER — Other Ambulatory Visit: Payer: Self-pay

## 2022-03-11 ENCOUNTER — Inpatient Hospital Stay: Payer: Medicare Other

## 2022-03-11 ENCOUNTER — Other Ambulatory Visit: Payer: Self-pay | Admitting: Internal Medicine

## 2022-03-11 DIAGNOSIS — J9601 Acute respiratory failure with hypoxia: Secondary | ICD-10-CM | POA: Diagnosis not present

## 2022-03-11 DIAGNOSIS — C3490 Malignant neoplasm of unspecified part of unspecified bronchus or lung: Secondary | ICD-10-CM | POA: Diagnosis not present

## 2022-03-11 DIAGNOSIS — J942 Hemothorax: Secondary | ICD-10-CM | POA: Diagnosis not present

## 2022-03-11 DIAGNOSIS — C801 Malignant (primary) neoplasm, unspecified: Secondary | ICD-10-CM

## 2022-03-11 DIAGNOSIS — Z5112 Encounter for antineoplastic immunotherapy: Secondary | ICD-10-CM | POA: Diagnosis not present

## 2022-03-11 DIAGNOSIS — Z48813 Encounter for surgical aftercare following surgery on the respiratory system: Secondary | ICD-10-CM | POA: Diagnosis not present

## 2022-03-11 DIAGNOSIS — E860 Dehydration: Secondary | ICD-10-CM | POA: Diagnosis not present

## 2022-03-11 DIAGNOSIS — C3482 Malignant neoplasm of overlapping sites of left bronchus and lung: Secondary | ICD-10-CM

## 2022-03-11 DIAGNOSIS — J910 Malignant pleural effusion: Secondary | ICD-10-CM | POA: Diagnosis not present

## 2022-03-11 DIAGNOSIS — R4689 Other symptoms and signs involving appearance and behavior: Secondary | ICD-10-CM | POA: Diagnosis not present

## 2022-03-11 DIAGNOSIS — Z5111 Encounter for antineoplastic chemotherapy: Secondary | ICD-10-CM | POA: Diagnosis not present

## 2022-03-11 DIAGNOSIS — C782 Secondary malignant neoplasm of pleura: Secondary | ICD-10-CM | POA: Diagnosis not present

## 2022-03-11 DIAGNOSIS — Z4801 Encounter for change or removal of surgical wound dressing: Secondary | ICD-10-CM | POA: Diagnosis not present

## 2022-03-11 LAB — CMP (CANCER CENTER ONLY)
ALT: 12 U/L (ref 0–44)
AST: 14 U/L — ABNORMAL LOW (ref 15–41)
Albumin: 3.2 g/dL — ABNORMAL LOW (ref 3.5–5.0)
Alkaline Phosphatase: 87 U/L (ref 38–126)
Anion gap: 4 — ABNORMAL LOW (ref 5–15)
BUN: 14 mg/dL (ref 8–23)
CO2: 30 mmol/L (ref 22–32)
Calcium: 8.9 mg/dL (ref 8.9–10.3)
Chloride: 100 mmol/L (ref 98–111)
Creatinine: 0.72 mg/dL (ref 0.61–1.24)
GFR, Estimated: >60 mL/min (ref >60)
Glucose, Bld: 108 mg/dL — ABNORMAL HIGH (ref 70–99)
Potassium: 4.5 mmol/L (ref 3.5–5.1)
Sodium: 134 mmol/L — ABNORMAL LOW (ref 135–145)
Total Bilirubin: 0.6 mg/dL (ref 0.3–1.2)
Total Protein: 6.1 g/dL — ABNORMAL LOW (ref 6.5–8.1)

## 2022-03-11 LAB — CBC WITH DIFFERENTIAL (CANCER CENTER ONLY)
Abs Immature Granulocytes: 0.01 10*3/uL (ref 0.00–0.07)
Basophils Absolute: 0 10*3/uL (ref 0.0–0.1)
Basophils Relative: 1 %
Eosinophils Absolute: 0 10*3/uL (ref 0.0–0.5)
Eosinophils Relative: 1 %
HCT: 35.6 % — ABNORMAL LOW (ref 39.0–52.0)
Hemoglobin: 11.7 g/dL — ABNORMAL LOW (ref 13.0–17.0)
Immature Granulocytes: 0 %
Lymphocytes Relative: 32 %
Lymphs Abs: 1.7 10*3/uL (ref 0.7–4.0)
MCH: 28.9 pg (ref 26.0–34.0)
MCHC: 32.9 g/dL (ref 30.0–36.0)
MCV: 87.9 fL (ref 80.0–100.0)
Monocytes Absolute: 1 10*3/uL (ref 0.1–1.0)
Monocytes Relative: 20 %
Neutro Abs: 2.5 10*3/uL (ref 1.7–7.7)
Neutrophils Relative %: 46 %
Platelet Count: 196 10*3/uL (ref 150–400)
RBC: 4.05 MIL/uL — ABNORMAL LOW (ref 4.22–5.81)
RDW: 16.3 % — ABNORMAL HIGH (ref 11.5–15.5)
WBC Count: 5.3 10*3/uL (ref 4.0–10.5)
nRBC: 0 % (ref 0.0–0.2)

## 2022-03-12 ENCOUNTER — Ambulatory Visit (HOSPITAL_COMMUNITY)
Admission: RE | Admit: 2022-03-12 | Discharge: 2022-03-12 | Disposition: A | Discharge status: Home / Self Care | Payer: Medicare Other | Source: Ambulatory Visit | Attending: Internal Medicine | Admitting: Internal Medicine

## 2022-03-12 ENCOUNTER — Other Ambulatory Visit: Payer: Self-pay

## 2022-03-12 ENCOUNTER — Encounter (HOSPITAL_COMMUNITY): Payer: Self-pay

## 2022-03-12 DIAGNOSIS — C787 Secondary malignant neoplasm of liver and intrahepatic bile duct: Secondary | ICD-10-CM | POA: Diagnosis not present

## 2022-03-12 DIAGNOSIS — I2510 Atherosclerotic heart disease of native coronary artery without angina pectoris: Secondary | ICD-10-CM | POA: Diagnosis not present

## 2022-03-12 DIAGNOSIS — Z87891 Personal history of nicotine dependence: Secondary | ICD-10-CM | POA: Insufficient documentation

## 2022-03-12 DIAGNOSIS — I255 Ischemic cardiomyopathy: Secondary | ICD-10-CM | POA: Diagnosis not present

## 2022-03-12 DIAGNOSIS — Z955 Presence of coronary angioplasty implant and graft: Secondary | ICD-10-CM | POA: Insufficient documentation

## 2022-03-12 DIAGNOSIS — M109 Gout, unspecified: Secondary | ICD-10-CM | POA: Diagnosis not present

## 2022-03-12 DIAGNOSIS — C3490 Malignant neoplasm of unspecified part of unspecified bronchus or lung: Secondary | ICD-10-CM | POA: Diagnosis not present

## 2022-03-12 DIAGNOSIS — I252 Old myocardial infarction: Secondary | ICD-10-CM | POA: Insufficient documentation

## 2022-03-12 DIAGNOSIS — C3482 Malignant neoplasm of overlapping sites of left bronchus and lung: Secondary | ICD-10-CM | POA: Insufficient documentation

## 2022-03-12 DIAGNOSIS — I480 Paroxysmal atrial fibrillation: Secondary | ICD-10-CM | POA: Insufficient documentation

## 2022-03-12 DIAGNOSIS — Z452 Encounter for adjustment and management of vascular access device: Secondary | ICD-10-CM | POA: Diagnosis not present

## 2022-03-12 DIAGNOSIS — Z8546 Personal history of malignant neoplasm of prostate: Secondary | ICD-10-CM | POA: Insufficient documentation

## 2022-03-12 DIAGNOSIS — C801 Malignant (primary) neoplasm, unspecified: Secondary | ICD-10-CM

## 2022-03-12 HISTORY — PX: IR IMAGING GUIDED PORT INSERTION: IMG5740

## 2022-03-12 MED ORDER — MIDAZOLAM HCL 2 MG/2ML IJ SOLN
INTRAMUSCULAR | Status: AC
  Filled 2022-03-12: qty 2

## 2022-03-12 MED ORDER — FENTANYL CITRATE (PF) 100 MCG/2ML IJ SOLN
INTRAMUSCULAR | Status: AC | PRN
  Administered 2022-03-12 (×2): 25 ug via INTRAVENOUS

## 2022-03-12 MED ORDER — SODIUM CHLORIDE 0.9 % IV SOLN: INTRAVENOUS | Status: DC

## 2022-03-12 MED ORDER — LIDOCAINE-EPINEPHRINE 1 %-1:100000 IJ SOLN
INTRAMUSCULAR | Status: AC
  Filled 2022-03-12: qty 1

## 2022-03-12 MED ORDER — HEPARIN SOD (PORK) LOCK FLUSH 100 UNIT/ML IV SOLN
INTRAVENOUS | Status: AC
  Filled 2022-03-12: qty 5

## 2022-03-12 MED ORDER — FENTANYL CITRATE (PF) 100 MCG/2ML IJ SOLN
INTRAMUSCULAR | Status: AC
  Filled 2022-03-12: qty 2

## 2022-03-12 MED ORDER — MIDAZOLAM HCL 2 MG/2ML IJ SOLN
INTRAMUSCULAR | Status: AC | PRN
  Administered 2022-03-12 (×2): .5 mg via INTRAVENOUS

## 2022-03-12 NOTE — Procedures (Signed)
  Procedure:  R IJ Port catheter placement   Preprocedure diagnosis: Diagnoses of Small cell lung cancer, overlapping sites of left lung ([place]) and Small cell carcinoma ([facility]) were pertinent to this visit.  Postprocedure diagnosis: same EBL:    minimal Complications:   none immediate  See full dictation in [facility].  Dillard Cannon MD Main # [phone] Pager  [phone] Mobile [phone]

## 2022-03-12 NOTE — Consult Note (Signed)
Chief Complaint: Patient was seen in consultation today for port a cath placement   Referring Physician(s): Mohamed,Mohamed  Supervising Physician: Arne Cleveland  Patient Status: [facility] - Out-pt  History of Present Illness: Pedro Pope. is an 81 y.o. male , ex smoker, with PMH extensive stage (T3, N2, M1 a) small cell lung cancer who presented with obstructive left upper lobe lung mass in addition to mediastinal lymphadenopathy and malignant left pleural effusion as well as pleural-based metastasis and suspicious liver metastasis diagnosed in June 2023.  He had a left Pleurx catheter placed by [facility]  on 01/21/22.  Additional past hx includes CAD with prior STEMI and stenting, Dressler's syndrome, gout, ischemic cardiomyopathy, paroxysmal atrial fibrillation, prediabetes, prostate cancer. He presents today for port a cath placement to assist with treatment.   Past Medical History:  Diagnosis Date   Anosmia    CAD (coronary artery disease), native coronary artery    a. 10/29/2013 antlat STEMI s/p DES to pLAD; residual 80 % proximal and 60% mid LCx diease   Chronic headaches    Dressler's syndrome ([place])    a. placed on colchicine    ED (erectile dysfunction)    Elevated TSH    Fatty liver    Gout    Ischemic cardiomyopathy    a. 2D ECHO 11/04/14 w/ EF 40-45% with LV WMA, G2DD, mild MR, mild LA dilation, mod TR.   PAF (paroxysmal atrial fibrillation) ([place])    a. Dx on 11/03/14 admission. Not placed on longterm AC due to DAPT w/ receent DES. Will plan for outpt heart monitor    Pre-diabetes    a. HgA1c 6.1 in 10/2014   Prostate cancer [facility])    s/p prostatectomy and XRT   Rheumatic fever 1947    Past Surgical History:  Procedure Laterality Date   CORONARY ANGIOPLASTY WITH STENT PLACEMENT  10/30/14   STEMI s/p DES to proximal LAD; residual 80 % proximal and 60% mid LCx diease   LEFT HEART CATHETERIZATION WITH CORONARY ANGIOGRAM N/A 10/30/2014   Procedure: LEFT HEART  CATHETERIZATION WITH CORONARY ANGIOGRAM;  Surgeon: Lorretta Harp, MD;  Location: [facility] CATH LAB;  Service: Cardiovascular;  Laterality: N/A;   PERCUTANEOUS CORONARY STENT INTERVENTION (PCI-S)  10/30/2014   Procedure: PERCUTANEOUS CORONARY STENT INTERVENTION (PCI-S);  Surgeon: Lorretta Harp, MD;  Location: [facility] CATH LAB;  Service: Cardiovascular;;   PROSTATECTOMY     THORACENTESIS Left 12/31/2021   Procedure: THORACENTESIS;  Surgeon: Candee Furbish, MD;  Location: [facility] ENDOSCOPY;  Service: Pulmonary;  Laterality: Left;   TONSILLECTOMY     VIDEO ASSISTED THORACOSCOPY (VATS)/DECORTICATION Left 01/21/2022   Procedure: VIDEO ASSISTED THORACOSCOPY (VATS)/DECORTICATION;  Surgeon: Lajuana Matte, MD;  Location: [facility];  Service: Thoracic;  Laterality: Left;    Allergies: Penicillins, Corn syrup [glucose], and Aspirin adult low [aspirin]  Medications: Prior to Admission medications   Medication Sig Start Date End Date Taking? Authorizing Provider  acetaminophen (TYLENOL) 500 MG tablet Take 500 mg by mouth every 6 (six) hours as needed for mild pain or moderate pain.   Yes [provider]  ascorbic acid (VITAMIN C) 500 MG tablet Take 1,000 mg by mouth daily. 11/01/14  Yes [provider]  Blood Pressure Monitor KIT For daily blood pressure monitoring;  Arm Cuff 06/05/15  Yes Lorretta Harp, MD  carvedilol (COREG) 3.125 MG tablet TAKE 1 TABLET BY MOUTH TWICE A DAY WITH MEALS Patient taking differently: Take 3.125 mg by mouth daily. Take 1 tablet by  mouth daily 07/27/20  Yes Lorretta Harp, MD  Cholecalciferol (VITAMIN D3) 25 MCG (1000 UT) CAPS Take 1 capsule by mouth daily.   Yes [provider]  oxyCODONE (ROXICODONE) 5 MG immediate release tablet Take 0.5-1 tablets (2.5-5 mg total) by mouth every 4 (four) hours as needed. 01/23/22 01/23/23 Yes Dwyane Dee, MD  prochlorperazine (COMPAZINE) 10 MG tablet Take 1 tablet (10 mg total) by mouth every 6 (six) hours as needed  for nausea or vomiting. 01/30/22   Curt Bears, MD     Family History  Problem Relation Age of Onset   Cancer Other    Coronary artery disease Brother 2       s/p CABG   Diabetes Other    Hyperlipidemia Other    Hypertension Other    Gout Other     Social History   Socioeconomic History   Marital status: Married    Spouse name: Not on file   Number of children: Not on file   Years of education: Not on file   Highest education level: Not on file  Occupational History   Not on file  Tobacco Use   Smoking status: Former    Types: Cigarettes    Quit date: 08/18/1992    Years since quitting: 29.5   Smokeless tobacco: Never  Vaping Use   Vaping Use: Not on file  Substance and Sexual Activity   Alcohol use: Not Currently    Comment: social   Drug use: No   Sexual activity: Not Currently  Other Topics Concern   Not on file  Social History Narrative   Pt lives in [place] with spouse.  Realtor.  Retired Environmental education officer   Social Determinants of Radio broadcast assistant Strain: Not on [employer] Insecurity: Not on file  Transportation Needs: Not on file  Physical Activity: Not on file  Stress: Not on file  Social Connections: Not on file      Review of Systems denies fever, headache, chest pain, cough, nausea, vomiting or bleeding.  Does have chronic dyspnea, intermittent abdominal/back pain  Vital Signs: BP 111/59, heart rate 61, temp 98.7, respirations 18, O2 sat 100% 3 L nasal cannula  Ht 5' 9"  (1.753 m)   BMI 18.39 kg/m      Physical Exam awake, alert.  Chest with some diminished breath sounds on left, few right basilar crackles. Ant left lower chest region pleurx cath in place with clean bandage.  Heart with regular rate and rhythm.  Abdomen soft, positive bowel sounds, some mild pelvic tenderness to palpation.  No lower extremity edema.  Imaging: ECHOCARDIOGRAM COMPLETE  Result Date: 02/25/2022    ECHOCARDIOGRAM REPORT   Patient Name:   Pedro Pope. Date of Exam: 02/25/2022 Medical Rec #:  [id number]          Height:       69.0 in Accession #:    [id number]         Weight:       124.5 lb Date of Birth:  08-13-1941          BSA:          1.689 m Patient Age:    15 years           BP:           104/57 mmHg Patient Gender: M                  HR:  67 bpm. Exam Location:  [facility] Procedure: Cardiac Doppler, Color Doppler and Strain Analysis Indications:    I50.22 CHF  History:        Patient has prior history of Echocardiogram examinations, most                 recent 04/19/2015. Ischemic cardiomyopathy, STEMI and CAD, Lung                 cancer,; Arrythmias:Atrial Fibrillation.  Sonographer:    Marygrace Drought RCS Referring Phys: [place]  1. Mid-apical anteroseptal akinesis. Apical hypokinesis. Left ventricular ejection fraction, by estimation, is 45 to 50%. The left ventricle has mildly decreased function. The left ventricle demonstrates regional wall motion abnormalities (see scoring diagram/findings for description). Left ventricular diastolic parameters are consistent with Grade I diastolic dysfunction (impaired relaxation). The average left ventricular global longitudinal strain is -14.8 %. The global longitudinal strain is abnormal.  2. Right ventricular systolic function is normal. The right ventricular size is normal. There is normal pulmonary artery systolic pressure.  3. The mitral valve is normal in structure. Trivial mitral valve regurgitation. No evidence of mitral stenosis.  4. The aortic valve is normal in structure. Aortic valve regurgitation is not visualized. No aortic stenosis is present.  5. The inferior vena cava is normal in size with greater than 50% respiratory variability, suggesting right atrial pressure of 3 mmHg. FINDINGS  Left Ventricle: Mid-apical anteroseptal akinesis. Apical hypokinesis. Left ventricular ejection fraction, by estimation, is 45 to 50%. The left ventricle has mildly  decreased function. The left ventricle demonstrates regional wall motion abnormalities. The average left ventricular global longitudinal strain is -14.8 %. The global longitudinal strain is abnormal. The left ventricular internal cavity size was normal in size. There is no left ventricular hypertrophy. Left ventricular diastolic parameters are consistent with Grade I diastolic dysfunction (impaired relaxation). Indeterminate filling pressures. Right Ventricle: The right ventricular size is normal. No increase in right ventricular wall thickness. Right ventricular systolic function is normal. There is normal pulmonary artery systolic pressure. The tricuspid regurgitant velocity is 1.85 m/s, and  with an assumed right atrial pressure of 3 mmHg, the estimated right ventricular systolic pressure is 63.8 mmHg. Left Atrium: Left atrial size was normal in size. Right Atrium: Right atrial size was normal in size. Pericardium: There is no evidence of pericardial effusion. Mitral Valve: The mitral valve is normal in structure. Trivial mitral valve regurgitation. No evidence of mitral valve stenosis. MV peak gradient, 6.2 mmHg. The mean mitral valve gradient is 2.5 mmHg. Tricuspid Valve: The tricuspid valve is normal in structure. Tricuspid valve regurgitation is trivial. No evidence of tricuspid stenosis. Aortic Valve: The aortic valve is normal in structure. Aortic valve regurgitation is not visualized. No aortic stenosis is present. Pulmonic Valve: The pulmonic valve was normal in structure. Pulmonic valve regurgitation is not visualized. No evidence of pulmonic stenosis. Aorta: The aortic root is normal in size and structure. Venous: The inferior vena cava is normal in size with greater than 50% respiratory variability, suggesting right atrial pressure of 3 mmHg. IAS/Shunts: No atrial level shunt detected by color flow Doppler.  LEFT VENTRICLE PLAX 2D LVIDd:         5.00 cm   Diastology LVIDs:         3.40 cm   LV e'  medial:    6.64 cm/s LV PW:         0.90 cm   LV E/e' medial:  12.7 LV IVS:        0.70 cm   LV e' lateral:   10.60 cm/s LVOT diam:     1.90 cm   LV E/e' lateral: 8.0 LV SV:         66 LV SV Index:   39        2D Longitudinal Strain LVOT Area:     2.84 cm  2D Strain GLS (A2C):   -14.5 %                          2D Strain GLS (A3C):   -13.4 %                          2D Strain GLS (A4C):   -16.5 %                          2D Strain GLS Avg:     -14.8 % RIGHT VENTRICLE RV Basal diam:  4.00 cm RV Mid diam:    2.50 cm RV S prime:     15.00 cm/s TAPSE (M-mode): 2.3 cm RVSP:           16.7 mmHg LEFT ATRIUM           Index        RIGHT ATRIUM           Index LA diam:      3.50 cm 2.07 cm/m   RA Pressure: 3.00 mmHg LA Vol (A4C): 35.6 ml 21.08 ml/m  RA Area:     16.30 cm                                    RA Volume:   45.20 ml  26.76 ml/m  AORTIC VALVE LVOT Vmax:   84.60 cm/s LVOT Vmean:  65.000 cm/s LVOT VTI:    0.233 m  AORTA Ao Root diam: 3.40 cm Ao Asc diam:  3.30 cm MITRAL VALVE                TRICUSPID VALVE MV Area (PHT): 2.94 cm     TR Peak grad:   13.7 mmHg MV Area VTI:   1.49 cm     TR Vmax:        185.00 cm/s MV Peak grad:  6.2 mmHg     Estimated RAP:  3.00 mmHg MV Mean grad:  2.5 mmHg     RVSP:           16.7 mmHg MV Vmax:       1.24 m/s MV Vmean:      75.2 cm/s    SHUNTS MV Decel Time: 258 msec     Systemic VTI:  0.23 m MV E velocity: 84.40 cm/s   Systemic Diam: 1.90 cm MV A velocity: 107.00 cm/s MV E/A ratio:  0.79 Skeet Latch MD Electronically signed by Skeet Latch MD Signature Date/Time: 02/25/2022/6:05:57 PM    Final     Labs:  CBC: Recent Labs    02/19/22 1032 02/24/22 0932 03/04/22 1029 03/11/22 1030  WBC 2.6* 4.4 4.1 5.3  HGB 11.9* 12.4* 11.1* 11.7*  HCT 36.7* 38.5* 35.1* 35.6*  PLT 170 490* 363 196    COAGS: Recent Labs    01/20/22 1516  INR 1.2    BMP: Recent Labs  02/19/22 1032 02/24/22 0932 03/04/22 1029 03/11/22 1030  NA 139 137 141 134*  K 3.9 5.2*  4.9 4.5  CL 104 104 105 100  CO2 33* 30 33* 30  GLUCOSE 113* 102* 126* 108*  BUN 12 13 12 14   CALCIUM 8.7* 9.0 8.9 8.9  CREATININE 0.73 0.74 0.67 0.72  GFRNONAA >60 >60 >60 >60    LIVER FUNCTION TESTS: Recent Labs    02/19/22 1032 02/24/22 0932 03/04/22 1029 03/11/22 1030  BILITOT 0.3 0.4 0.4 0.6  AST 17 17 19  14*  ALT 20 14 11 12   ALKPHOS 102 109 88 87  PROT 5.6* 5.9* 5.7* 6.1*  ALBUMIN 2.7* 2.9* 3.0* 3.2*    TUMOR MARKERS: No results for input(s): "AFPTM", "CEA", "CA199", "CHROMGRNA" in the last 8760 hours.  Assessment and Plan: 81 y.o. male , ex smoker, with PMH extensive stage (T3, N2, M1 a) small cell lung cancer who presented with obstructive left upper lobe lung mass in addition to mediastinal lymphadenopathy and malignant left pleural effusion as well as pleural-based metastasis and suspicious liver metastasis diagnosed in June 2023. He had a left Pleurx catheter placed by [facility]  on 01/21/22. Additional past hx includes CAD with prior STEMI and stenting, Dressler's syndrome, gout, ischemic cardiomyopathy, paroxysmal atrial fibrillation, prediabetes, prostate cancer. He presents today for port a cath placement to assist with treatment.Risks and benefits of image guided port-a-catheter placement was discussed with the patient including, but not limited to bleeding, infection, pneumothorax, or fibrin sheath development and need for additional procedures.  All of the patient's questions were answered, patient is agreeable to proceed. Consent signed and in chart.    Thank you for this interesting consult.  I greatly enjoyed meeting [employer]. and look forward to participating in their care.  A copy of this report was sent to the requesting provider on this date.  Electronically Signed: D. Rowe Robert, PA-C 03/12/2022, 12:10 PM   I spent a total of 25 minutes    in face to face in clinical consultation, greater than 50% of which was counseling/coordinating care for  Port-A-Cath placement

## 2022-03-12 NOTE — Discharge Instructions (Signed)
Please call Interventional Radiology clinic [phone] with any questions or concerns.  You may remove your dressing and shower tomorrow.  DO NOT use EMLA cream on your port site for 2 weeks as this cream will remove surgical glue on your incision.  Implanted Port Insertion, Care After This sheet gives you information about how to care for yourself after your procedure. Your health care provider may also give you more specific instructions. If you have problems or questions, contact your health care provider. What can I expect after the procedure? After the procedure, it is common to have: Discomfort at the port insertion site. Bruising on the skin over the port. This should improve over 3-4 days. Follow these instructions at home: [facility] care After your port is placed, you will get a manufacturer's information card. The card has information about your port. Keep this card with you at all times. Take care of the port as told by your health care provider. Ask your health care provider if you or a family member can get training for taking care of the port at home. A home health care nurse may also take care of the port. Make sure to remember what type of port you have. Incision care Follow instructions from your health care provider about how to take care of your port insertion site. Make sure you: Wash your hands with soap and water before and after you change your bandage (dressing). If soap and water are not available, use hand sanitizer. Change your dressing as told by your health care provider. Leave stitches (sutures), skin glue, or adhesive strips in place. These skin closures may need to stay in place for 2 weeks or longer. If adhesive strip edges start to loosen and curl up, you may trim the loose edges. Do not remove adhesive strips completely unless your health care provider tells you to do that. Check your port insertion site every day for signs of infection. Check for: Redness,  swelling, or pain. Fluid or blood. Warmth. Pus or a bad smell.        Activity Return to your normal activities as told by your health care provider. Ask your health care provider what activities are safe for you. Do not lift anything that is heavier than 10 lb (4.5 kg), or the limit that you are told, until your health care provider says that it is safe. General instructions Take over-the-counter and prescription medicines only as told by your health care provider. Do not take baths, swim, or use a hot tub until your health care provider approves. Ask your health care provider if you may take showers. You may only be allowed to take sponge baths. Do not drive for 24 hours if you were given a sedative during your procedure. Wear a medical alert bracelet in case of an emergency. This will tell any health care providers that you have a port. Keep all follow-up visits as told by your health care provider. This is important. Contact a health care provider if: You cannot flush your port with saline as directed, or you cannot draw blood from the port. You have a fever or chills. You have redness, swelling, or pain around your port insertion site. You have fluid or blood coming from your port insertion site. Your port insertion site feels warm to the touch. You have pus or a bad smell coming from the port insertion site. Get help right away if: You have chest pain or shortness of breath. You have bleeding from  your port that you cannot control. Summary Take care of the port as told by your health care provider. Keep the manufacturer's information card with you at all times. Change your dressing as told by your health care provider. Contact a health care provider if you have a fever or chills or if you have redness, swelling, or pain around your port insertion site. Keep all follow-up visits as told by your health care provider. This information is not intended to replace advice given to you  by your health care provider. Make sure you discuss any questions you have with your health care provider. Document Revised: 03/02/2018 Document Reviewed: 03/02/2018 Elsevier Patient Education  2021 [employer].   Moderate Conscious Sedation, Adult, Care After This sheet gives you information about how to care for yourself after your procedure. Your health care provider may also give you more specific instructions. If you have problems or questions, contact your health care provider. What can I expect after the procedure? After the procedure, it is common to have: Sleepiness for several hours. Impaired judgment for several hours. Difficulty with balance. Vomiting if you eat too soon. Follow these instructions at home: For the time period you were told by your health care provider: Rest. Do not participate in activities where you could fall or become injured. Do not drive or use machinery. Do not drink alcohol. Do not take sleeping pills or medicines that cause drowsiness. Do not make important decisions or sign legal documents. Do not take care of children on your own.        Eating and drinking Follow the diet recommended by your health care provider. Drink enough fluid to keep your urine pale yellow. If you vomit: Drink water, juice, or soup when you can drink without vomiting. Make sure you have little or no nausea before eating solid foods.    General instructions Take over-the-counter and prescription medicines only as told by your health care provider. Have a responsible adult stay with you for the time you are told. It is important to have someone help care for you until you are awake and alert. Do not smoke. Keep all follow-up visits as told by your health care provider. This is important. Contact a health care provider if: You are still sleepy or having trouble with balance after 24 hours. You feel light-headed. You keep feeling nauseous or you keep vomiting. You  develop a rash. You have a fever. You have redness or swelling around the IV site. Get help right away if: You have trouble breathing. You have new-onset confusion at home. Summary After the procedure, it is common to feel sleepy, have impaired judgment, or feel nauseous if you eat too soon. Rest after you get home. Know the things you should not do after the procedure. Follow the diet recommended by your health care provider and drink enough fluid to keep your urine pale yellow. Get help right away if you have trouble breathing or new-onset confusion at home. This information is not intended to replace advice given to you by your health care provider. Make sure you discuss any questions you have with your health care provider. Document Revised: 12/02/2019 Document Reviewed: 06/30/2019 Elsevier Patient Education  2021 [employer].

## 2022-03-13 ENCOUNTER — Ambulatory Visit (HOSPITAL_COMMUNITY)
Admission: RE | Admit: 2022-03-13 | Discharge: 2022-03-13 | Disposition: A | Discharge status: Home / Self Care | Payer: Medicare Other | Source: Ambulatory Visit | Attending: Internal Medicine | Admitting: Internal Medicine

## 2022-03-13 DIAGNOSIS — J9601 Acute respiratory failure with hypoxia: Secondary | ICD-10-CM | POA: Diagnosis not present

## 2022-03-13 DIAGNOSIS — K769 Liver disease, unspecified: Secondary | ICD-10-CM | POA: Diagnosis not present

## 2022-03-13 DIAGNOSIS — Z48813 Encounter for surgical aftercare following surgery on the respiratory system: Secondary | ICD-10-CM | POA: Diagnosis not present

## 2022-03-13 DIAGNOSIS — C3490 Malignant neoplasm of unspecified part of unspecified bronchus or lung: Secondary | ICD-10-CM | POA: Insufficient documentation

## 2022-03-13 DIAGNOSIS — J90 Pleural effusion, not elsewhere classified: Secondary | ICD-10-CM | POA: Diagnosis not present

## 2022-03-13 DIAGNOSIS — J910 Malignant pleural effusion: Secondary | ICD-10-CM | POA: Diagnosis not present

## 2022-03-13 DIAGNOSIS — Z4801 Encounter for change or removal of surgical wound dressing: Secondary | ICD-10-CM | POA: Diagnosis not present

## 2022-03-13 DIAGNOSIS — J432 Centrilobular emphysema: Secondary | ICD-10-CM | POA: Diagnosis not present

## 2022-03-13 DIAGNOSIS — J942 Hemothorax: Secondary | ICD-10-CM | POA: Diagnosis not present

## 2022-03-13 MED ORDER — SODIUM CHLORIDE (PF) 0.9 % IJ SOLN
INTRAMUSCULAR | Status: AC
  Filled 2022-03-13: qty 50

## 2022-03-13 MED ORDER — IOHEXOL 300 MG/ML  SOLN
100.0000 mL | Freq: Once | INTRAMUSCULAR | Status: AC | PRN
  Administered 2022-03-13: 100 mL via INTRAVENOUS

## 2022-03-13 NOTE — Progress Notes (Signed)
[facility] OFFICE PROGRESS NOTE  Shon Baton, MD [place] [address]  DIAGNOSIS:  Extensive stage (T3, N2, M1 a) small cell lung cancer presented with obstructive left upper lobe lung mass in addition to mediastinal lymphadenopathy and malignant left pleural effusion as well as pleural-based metastasis as well as suspicious liver metastasis diagnosed in June 2023.  PRIOR THERAPY: None  CURRENT THERAPY:  Palliative systemic chemotherapy with carboplatin initiated for AUC of 4 on day 1 and etoposide 80 Mg/M2 on days 1, 2 and 3 with Cosela before the chemotherapy and Imfinzi 1500 Mg IV on day 1 every 3 weeks.  Status post 2 cycles.    INTERVAL HISTORY: Ashlee Bewley. 81 y.o. male returns to the clinic today for a follow-up visit accompanied by his wife and daughter.  The patient is currently undergoing treatment for extensive stage small cell lung cancer.  He is undergoing chemotherapy and immunotherapy.  He is status post 2 cycles.  He has had increasing fatigue and weakness with treatment as well as decreased appetite which he reports lasts a day or so.  His weight is stable.  His wife is ensuring that he drinks 2-3 protein drinks per day.  He has also been eating popsicles for more fluid.  Overall his breathing is improved since starting chemotherapy but he is still requiring home oxygen.  He is requiring 4 liters of supplemental oxygen.  The patient has a Pleurx catheter for recurrent pleural effusions.  He is draining this once per week.  It was drained yesterday which did not have any significant drainage.  He denies any cough. Denies any chest pain or hemoptysis.  Denies any nausea, vomiting, or diarrhea.  His constipation is controlled with laxative such as MiraLAX or milk of magnesia.  The patient had some erythema and redness of his lower extremity last week which was thought to be secondary to gout.  This has resolved at this time.  Denies any headache or  visual changes.  The patient's wife mentions that his memory has not been as good since starting chemotherapy.  For example, is taking the patient longer to perform functions on his phone which he did not use had trouble with.  He had a brain MRI performed approximately 1 month ago which was negative for metastatic disease to the brain.  The patient has had a few dizzy spells recently.  He has had bouts of hypotension.  The only medication he is taking from a cardiology standpoint is Coreg which was recently dose reduced to once per day.  This is prescribed by Dr. Alvester Chou, his cardiologist.  The patient recently had a restaging CT scan performed.  He is here today for evaluation to review his scan results before starting cycle #3.    MEDICAL HISTORY: Past Medical History:  Diagnosis Date   Anosmia    CAD (coronary artery disease), native coronary artery    a. 10/29/2013 antlat STEMI s/p DES to pLAD; residual 80 % proximal and 60% mid LCx diease   Chronic headaches    Dressler's syndrome (HCC)    a. placed on colchicine    ED (erectile dysfunction)    Elevated TSH    Fatty liver    Gout    Ischemic cardiomyopathy    a. 2D ECHO 11/04/14 w/ EF 40-45% with LV WMA, G2DD, mild MR, mild LA dilation, mod TR.   PAF (paroxysmal atrial fibrillation) ([facility])    a. Dx on 11/03/14 admission. Not  placed on longterm AC due to DAPT w/ receent DES. Will plan for outpt heart monitor    Pre-diabetes    a. HgA1c 6.1 in 10/2014   Prostate cancer [facility])    s/p prostatectomy and XRT   Rheumatic fever 1947    ALLERGIES:  is allergic to penicillins, corn syrup [glucose], and aspirin adult low [aspirin].  MEDICATIONS:  Current Outpatient Medications  Medication Sig Dispense Refill   acetaminophen (TYLENOL) 500 MG tablet Take 500 mg by mouth every 6 (six) hours as needed for mild pain or moderate pain.     ascorbic acid (VITAMIN C) 500 MG tablet Take 1,000 mg by mouth daily.     Blood Pressure Monitor KIT For daily  blood pressure monitoring;  Arm Cuff 1 each 0   carvedilol (COREG) 3.125 MG tablet TAKE 1 TABLET BY MOUTH TWICE A DAY WITH MEALS (Patient taking differently: Take 3.125 mg by mouth daily. Take 1 tablet by mouth daily) 180 tablet 3   Cholecalciferol (VITAMIN D3) 25 MCG (1000 UT) CAPS Take 1 capsule by mouth daily.     oxyCODONE (ROXICODONE) 5 MG immediate release tablet Take 0.5-1 tablets (2.5-5 mg total) by mouth every 4 (four) hours as needed. 30 tablet 0   prochlorperazine (COMPAZINE) 10 MG tablet Take 1 tablet (10 mg total) by mouth every 6 (six) hours as needed for nausea or vomiting. 30 tablet 0   No current facility-administered medications for this visit.    SURGICAL HISTORY:  Past Surgical History:  Procedure Laterality Date   CORONARY ANGIOPLASTY WITH STENT PLACEMENT  10/30/14   STEMI s/p DES to proximal LAD; residual 80 % proximal and 60% mid LCx diease   IR IMAGING GUIDED PORT INSERTION  03/12/2022   LEFT HEART CATHETERIZATION WITH CORONARY ANGIOGRAM N/A 10/30/2014   Procedure: LEFT HEART CATHETERIZATION WITH CORONARY ANGIOGRAM;  Surgeon: Lorretta Harp, MD;  Location[facility] CATH LAB;  Service: Cardiovascular;  Laterality: N/A;   PERCUTANEOUS CORONARY STENT INTERVENTION (PCI-S)  10/30/2014   Procedure: PERCUTANEOUS CORONARY STENT INTERVENTION (PCI-S);  Surgeon: Lorretta Harp, MD;  Location[facility] C[facility] LAB;  Service: Cardiovascular;;   PROSTATECTOMY     THORACENTESIS Left 12/31/2021   Procedure: THORACENTESIS;  Surgeon: Candee Furbish, MD;  Location: [facility] ENDOSCOPY;  Service: Pulmonary;  Laterality: Left;   TONSILLECTOMY     VIDEO ASSISTED THORACOSCOPY (VATS)/DECORTICATION Left 01/21/2022   Procedure: VIDEO ASSISTED THORACOSCOPY (VATS)/DECORTICATION;  Surgeon: Lajuana Matte, MD;  Location: [facility] OR;  Service: Thoracic;  Laterality: Left;    REVIEW OF SYSTEMS:   Review of Systems  Constitutional: Positive for fatigue and weakness following treatment.  Negative for appetite change,  chills, fever and unexpected weight change.  HENT: Negative for mouth sores, nosebleeds, sore throat and trouble swallowing.   Eyes: Negative for eye problems and icterus.  Respiratory: Positive for dyspnea on exertion  (improved since starting treatment) negative for cough, hemoptysis, and wheezing.   Cardiovascular: Negative for chest pain and leg swelling.  Gastrointestinal: Negative for abdominal pain, constipation (none at this time), diarrhea, nausea and vomiting.  Genitourinary: Negative for bladder incontinence, difficulty urinating, dysuria, frequency and hematuria.   Musculoskeletal: Negative for back pain, gait problem, neck pain and neck stiffness.  Skin: Negative for itching and rash.  Neurological: Positive for occasional dizzy spells.  Negative for extremity weakness, gait problem, headaches, light-headedness and seizures.  Hematological: Negative for adenopathy. Does not bruise/bleed easily.  Psychiatric/Behavioral: Negative for confusion, depression and sleep disturbance. The patient is not  nervous/anxious.     PHYSICAL EXAMINATION:  There were no vitals taken for this visit.  ECOG PERFORMANCE STATUS: 2  Physical Exam  Constitutional: Oriented to person, place, and time and thin appearing male, and in no distress.  HENT:  Head: Normocephalic and atraumatic.  Mouth/Throat: Oropharynx is clear and moist. No oropharyngeal exudate.  Eyes: Conjunctivae are normal. Right eye exhibits no discharge. Left eye exhibits no discharge. No scleral icterus.  Neck: Normal range of motion. Neck supple.  Cardiovascular: Normal rate, regular rhythm, normal heart sounds and intact distal pulses.   Pulmonary/Chest: Effort normal and breath sounds normal. No respiratory distress. No wheezes. No rales.  Abdominal: Soft. Bowel sounds are normal. Exhibits no distension and no mass. There is no tenderness.  Musculoskeletal: Normal range of motion. Exhibits no edema.  Lymphadenopathy:    No  cervical adenopathy.  Neurological: Alert and oriented to person, place, and time. Exhibits muscle wasting.  The patient was examined in the wheelchair.  Skin: Skin is warm and dry. No rash noted. Not diaphoretic. No erythema. No pallor.  Psychiatric: Mood, memory and judgment normal.  Vitals reviewed.  LABORATORY DATA: Lab Results  Component Value Date   WBC 5.3 03/11/2022   HGB 11.7 (L) 03/11/2022   HCT 35.6 (L) 03/11/2022   MCV 87.9 03/11/2022   PLT 196 03/11/2022      Chemistry      Component Value Date/Time   NA 134 (L) 03/11/2022 1030   K 4.5 03/11/2022 1030   CL 100 03/11/2022 1030   CO2 30 03/11/2022 1030   BUN 14 03/11/2022 1030   CREATININE 0.72 03/11/2022 1030      Component Value Date/Time   CALCIUM 8.9 03/11/2022 1030   ALKPHOS 87 03/11/2022 1030   AST 14 (L) 03/11/2022 1030   ALT 12 03/11/2022 1030   BILITOT 0.6 03/11/2022 1030       RADIOGRAPHIC STUDIES:  IR IMAGING GUIDED PORT INSERTION  Result Date: 03/12/2022 CLINICAL DATA:  Small cell lung carcinoma, needs durable venous access for planned treatment regimen EXAM: TUNNELED PORT CATHETER PLACEMENT WITH ULTRASOUND AND FLUOROSCOPIC GUIDANCE FLUOROSCOPY: Radiation Exposure Index (as provided by the fluoroscopic device): Less than 0.1 mGy air Kerma ANESTHESIA/SEDATION: Intravenous Fentanyl 75mg and Versed 142mwere administered as conscious sedation during continuous monitoring of the patient's level of consciousness and physiological / cardiorespiratory status by the radiology RN, with a total moderate sedation time of 17 minutes. TECHNIQUE: The procedure, risks, benefits, and alternatives were explained to the patient. Questions regarding the procedure were encouraged and answered. The patient understands and consents to the procedure. Patency of the right IJ vein was confirmed with ultrasound with image documentation. An appropriate skin site was determined. Skin site was marked. Region was prepped using maximum  barrier technique including cap and mask, sterile gown, sterile gloves, large sterile sheet, and Chlorhexidine as cutaneous antisepsis. The region was infiltrated locally with 1% lidocaine. Under real-time ultrasound guidance, the right IJ vein was accessed with a 21 gauge micropuncture needle; the needle tip within the vein was confirmed with ultrasound image documentation. Needle was exchanged over a 018 guidewire for transitional dilator, and vascular measurement was performed. A small incision was made on the right anterior chest wall and a subcutaneous pocket fashioned. The power-injectable port was positioned and its catheter tunneled to the right IJ dermatotomy site. The transitional dilator was exchanged over an Amplatz wire for a peel-away sheath, through which the port catheter, which had been trimmed to the appropriate  length, was advanced and positioned under fluoroscopy with its tip at the cavoatrial junction. Spot chest radiograph confirms good catheter position and no pneumothorax. The port was flushed per protocol. The pocket was closed with deep interrupted and subcuticular continuous 3-0 Monocryl sutures. The incisions were covered with Dermabond then covered with a sterile dressing. The patient tolerated the procedure well. COMPLICATIONS: COMPLICATIONS None immediate IMPRESSION: Technically successful right IJ power-injectable port catheter placement. Ready for routine use. Electronically Signed   By: Lucrezia Europe M.D.   On: 03/12/2022 15:42   ECHOCARDIOGRAM COMPLETE  Result Date: 02/25/2022    ECHOCARDIOGRAM REPORT   Patient Name:   Wilma Wuthrich. Date of Exam: 02/25/2022 Medical Rec #:  [id number]          Height:       69.0 in Accession #:    [id number]         Weight:       124.5 lb Date of Birth:  Nov 09, 1940          BSA:          1.689 m Patient Age:    61 years           BP:           104/57 mmHg Patient Gender: M                  HR:           67 bpm. Exam Location:  [facility]  Procedure: Cardiac Doppler, Color Doppler and Strain Analysis Indications:    I50.22 CHF  History:        Patient has prior history of Echocardiogram examinations, most                 recent 04/19/2015. Ischemic cardiomyopathy, STEMI and CAD, Lung                 cancer,; Arrythmias:Atrial Fibrillation.  Sonographer:    Marygrace Drought RCS Referring Phys: [place]  1. Mid-apical anteroseptal akinesis. Apical hypokinesis. Left ventricular ejection fraction, by estimation, is 45 to 50%. The left ventricle has mildly decreased function. The left ventricle demonstrates regional wall motion abnormalities (see scoring diagram/findings for description). Left ventricular diastolic parameters are consistent with Grade I diastolic dysfunction (impaired relaxation). The average left ventricular global longitudinal strain is -14.8 %. The global longitudinal strain is abnormal.  2. Right ventricular systolic function is normal. The right ventricular size is normal. There is normal pulmonary artery systolic pressure.  3. The mitral valve is normal in structure. Trivial mitral valve regurgitation. No evidence of mitral stenosis.  4. The aortic valve is normal in structure. Aortic valve regurgitation is not visualized. No aortic stenosis is present.  5. The inferior vena cava is normal in size with greater than 50% respiratory variability, suggesting right atrial pressure of 3 mmHg. FINDINGS  Left Ventricle: Mid-apical anteroseptal akinesis. Apical hypokinesis. Left ventricular ejection fraction, by estimation, is 45 to 50%. The left ventricle has mildly decreased function. The left ventricle demonstrates regional wall motion abnormalities. The average left ventricular global longitudinal strain is -14.8 %. The global longitudinal strain is abnormal. The left ventricular internal cavity size was normal in size. There is no left ventricular hypertrophy. Left ventricular diastolic parameters are consistent with  Grade I diastolic dysfunction (impaired relaxation). Indeterminate filling pressures. Right Ventricle: The right ventricular size is normal. No increase in right ventricular wall thickness. Right ventricular systolic function is normal.  There is normal pulmonary artery systolic pressure. The tricuspid regurgitant velocity is 1.85 m/s, and  with an assumed right atrial pressure of 3 mmHg, the estimated right ventricular systolic pressure is 24.5 mmHg. Left Atrium: Left atrial size was normal in size. Right Atrium: Right atrial size was normal in size. Pericardium: There is no evidence of pericardial effusion. Mitral Valve: The mitral valve is normal in structure. Trivial mitral valve regurgitation. No evidence of mitral valve stenosis. MV peak gradient, 6.2 mmHg. The mean mitral valve gradient is 2.5 mmHg. Tricuspid Valve: The tricuspid valve is normal in structure. Tricuspid valve regurgitation is trivial. No evidence of tricuspid stenosis. Aortic Valve: The aortic valve is normal in structure. Aortic valve regurgitation is not visualized. No aortic stenosis is present. Pulmonic Valve: The pulmonic valve was normal in structure. Pulmonic valve regurgitation is not visualized. No evidence of pulmonic stenosis. Aorta: The aortic root is normal in size and structure. Venous: The inferior vena cava is normal in size with greater than 50% respiratory variability, suggesting right atrial pressure of 3 mmHg. IAS/Shunts: No atrial level shunt detected by color flow Doppler.  LEFT VENTRICLE PLAX 2D LVIDd:         5.00 cm   Diastology LVIDs:         3.40 cm   LV e' medial:    6.64 cm/s LV PW:         0.90 cm   LV E/e' medial:  12.7 LV IVS:        0.70 cm   LV e' lateral:   10.60 cm/s LVOT diam:     1.90 cm   LV E/e' lateral: 8.0 LV SV:         66 LV SV Index:   39        2D Longitudinal Strain LVOT Area:     2.84 cm  2D Strain GLS (A2C):   -14.5 %                          2D Strain GLS (A3C):   -13.4 %                           2D Strain GLS (A4C):   -16.5 %                          2D Strain GLS Avg:     -14.8 % RIGHT VENTRICLE RV Basal diam:  4.00 cm RV Mid diam:    2.50 cm RV S prime:     15.00 cm/s TAPSE (M-mode): 2.3 cm RVSP:           16.7 mmHg LEFT ATRIUM           Index        RIGHT ATRIUM           Index LA diam:      3.50 cm 2.07 cm/m   RA Pressure: 3.00 mmHg LA Vol (A4C): 35.6 ml 21.08 ml/m  RA Area:     16.30 cm                                    RA Volume:   45.20 ml  26.76 ml/m  AORTIC VALVE LVOT Vmax:   84.60 cm/s LVOT Vmean:  65.000 cm/s LVOT VTI:  0.233 m  AORTA Ao Root diam: 3.40 cm Ao Asc diam:  3.30 cm MITRAL VALVE                TRICUSPID VALVE MV Area (PHT): 2.94 cm     TR Peak grad:   13.7 mmHg MV Area VTI:   1.49 cm     TR Vmax:        185.00 cm/s MV Peak grad:  6.2 mmHg     Estimated RAP:  3.00 mmHg MV Mean grad:  2.5 mmHg     RVSP:           16.7 mmHg MV Vmax:       1.24 m/s MV Vmean:      75.2 cm/s    SHUNTS MV Decel Time: 258 msec     Systemic VTI:  0.23 m MV E velocity: 84.40 cm/s   Systemic Diam: 1.90 cm MV A velocity: 107.00 cm/s MV E/A ratio:  0.79 Skeet Latch MD Electronically signed by Skeet Latch MD Signature Date/Time: 02/25/2022/6:05:57 PM    Final      ASSESSMENT/PLAN:  This is a very pleasant 81 year old Caucasian male diagnosed with extensive stage (T3, N2, M1 a) small cell lung cancer.  He presented with an obstructive left upper lobe lung mass in addition to mediastinal lymphadenopathy.  He also has a malignant left pleural effusion and pleural-based metastases.  He also has a suspicious liver metastasis.  He was diagnosed in June 2023.  The patient is currently undergoing slightly reduced dose systemic chemotherapy with carboplatin for an AUC of 4 on day 1, etoposide 80 mg per metered squared on days 1 2, and 3 with Imfinzi on day 1 every 3 weeks.  He is status post 2 cycles.  The patient recently had a restaging CT scan performed.  Dr. Julien Nordmann personally independently  reviewed the scan and discussed the results with the patient today.  The scan showed a positive response to treatment.  Recommend that he continue on the same treatment the same dose.  We will see him back for follow-up visit in 3 weeks for evaluation and repeat blood work before starting cycle #4.  He will continue to follow closely with Dr. Kipp Brood regarding his Pleurx catheter.  Reports they are only draining this once a week and there is no significant drainage.  He will continue wearing supplemental oxygen and monitoring this closely at home.  I advised him to monitor his blood pressure closely at home as well, especially when he is feeling lightheaded.  Considering the patient recently had a brain MRI, I suspect that his lightheadedness is from his hypotension.  We will arrange for him to receive 1 L of fluid while in the infusion room today.  I also discussed with the patient that he does not need to fast before his appointments.  The patient did not know this and had been fasting this mornings before his treatments.  I have sent a prescription for Emla cream to his pharmacy.  We reviewed how to use this for his Port-A-Cath.  I will also ensure that his lab appointments are lab and flush appointments.  He is scheduled to see a member of the nutritionist team while in the infusion room today.   The patient was advised to call immediately if he has any concerning symptoms in the interval. The patient voices understanding of current disease status and treatment options and is in agreement with the current care plan. All questions were  answered. The patient knows to call the clinic with any problems, questions or concerns. We can certainly see the patient much sooner if necessary   No orders of the defined types were placed in this encounter.    Proctor Carriker L Tyniah Kastens, PA-C 03/13/22  ADDENDUM: Hematology/Oncology Attending: I had a face-to-face encounter with the patient today.  I  reviewed his record, lab, scan and recommended his care plan.  This is a very pleasant 81 years old white male diagnosed with extensive stage small cell lung cancer in June 2023.  The patient is currently undergoing systemic chemotherapy with carboplatin, etoposide and Imfinzi with Cosela before chemotherapy status post 2 cycles.  He has been tolerating his treatment well with no concerning adverse effects.  He continues to have the baseline shortness of breath.  His drainage from the left Pleurx catheter has significantly improved. The patient had repeat CT scan of the chest, abdomen and pelvis performed recently.  I personally and independently reviewed the scan and discussed the results with the patient and his family. His scan showed significant improvement in his disease. I recommended for him to continue his current treatment with carboplatin, etoposide and Imfinzi every 3 weeks as planned for the next 2 cycles before moving to the maintenance treatment with Imfinzi if he has no disease progression after cycle #4. The patient will come back for follow-up visit in 3 weeks for evaluation before the next cycle of his treatment. He was advised to call immediately if he has any other concerning symptoms in the interval. The total time spent in the appointment was 30 minutes. Disclaimer: This note was dictated with voice recognition software. Similar sounding words can inadvertently be transcribed and may be missed upon review. Eilleen Kempf, MD

## 2022-03-14 ENCOUNTER — Ambulatory Visit (INDEPENDENT_AMBULATORY_CARE_PROVIDER_SITE_OTHER): Payer: Self-pay | Admitting: Thoracic Surgery (Cardiothoracic Vascular Surgery)

## 2022-03-14 ENCOUNTER — Ambulatory Visit
Admission: RE | Admit: 2022-03-14 | Discharge: 2022-03-14 | Disposition: A | Discharge status: Home / Self Care | Payer: Medicare Other | Source: Ambulatory Visit | Attending: Thoracic Surgery (Cardiothoracic Vascular Surgery) | Admitting: Thoracic Surgery (Cardiothoracic Vascular Surgery)

## 2022-03-14 ENCOUNTER — Other Ambulatory Visit (HOSPITAL_COMMUNITY): Payer: Medicare Other

## 2022-03-14 VITALS — BP 93/53 | HR 72 | Resp 20 | Ht 69.0 in | Wt 124.0 lb

## 2022-03-14 DIAGNOSIS — C3482 Malignant neoplasm of overlapping sites of left bronchus and lung: Secondary | ICD-10-CM

## 2022-03-14 DIAGNOSIS — Z09 Encounter for follow-up examination after completed treatment for conditions other than malignant neoplasm: Secondary | ICD-10-CM

## 2022-03-14 DIAGNOSIS — J90 Pleural effusion, not elsewhere classified: Secondary | ICD-10-CM

## 2022-03-14 NOTE — Progress Notes (Signed)
     [address]       [place] [address]             [phone]       Pedro Pope presents in follow-up to discuss management of his Pleurx catheter.  He is draining it 3-4 times per week resulting in less than 50 cc of fluid.  He had a CT scan performed on 03/12/2022 which showed no residual fluid collection and a well-expanded left lung.  His chest x-ray today also demonstrates minimal fluid collection.  I instructed him to drain it once a week, and in 2 weeks if this is less than 200 mL, and his chest x-ray is stable we will plan for removal of his Pleurx catheter.

## 2022-03-17 DIAGNOSIS — C3490 Malignant neoplasm of unspecified part of unspecified bronchus or lung: Secondary | ICD-10-CM | POA: Diagnosis not present

## 2022-03-17 DIAGNOSIS — J910 Malignant pleural effusion: Secondary | ICD-10-CM | POA: Diagnosis not present

## 2022-03-17 DIAGNOSIS — Z4801 Encounter for change or removal of surgical wound dressing: Secondary | ICD-10-CM | POA: Diagnosis not present

## 2022-03-17 DIAGNOSIS — Z48813 Encounter for surgical aftercare following surgery on the respiratory system: Secondary | ICD-10-CM | POA: Diagnosis not present

## 2022-03-17 DIAGNOSIS — J9601 Acute respiratory failure with hypoxia: Secondary | ICD-10-CM | POA: Diagnosis not present

## 2022-03-17 DIAGNOSIS — J942 Hemothorax: Secondary | ICD-10-CM | POA: Diagnosis not present

## 2022-03-18 ENCOUNTER — Inpatient Hospital Stay: Payer: Medicare Other | Attending: Internal Medicine

## 2022-03-18 ENCOUNTER — Inpatient Hospital Stay: Payer: Medicare Other | Admitting: Dietician

## 2022-03-18 ENCOUNTER — Inpatient Hospital Stay: Payer: Medicare Other

## 2022-03-18 ENCOUNTER — Other Ambulatory Visit: Payer: Self-pay

## 2022-03-18 ENCOUNTER — Encounter: Payer: Self-pay | Admitting: Physician Assistant

## 2022-03-18 ENCOUNTER — Inpatient Hospital Stay (HOSPITAL_BASED_OUTPATIENT_CLINIC_OR_DEPARTMENT_OTHER): Payer: Medicare Other | Admitting: Physician Assistant

## 2022-03-18 VITALS — BP 94/71 | HR 64 | Temp 97.9°F | Resp 18 | Wt 125.2 lb

## 2022-03-18 VITALS — BP 104/62 | HR 60 | Resp 18

## 2022-03-18 DIAGNOSIS — Z5111 Encounter for antineoplastic chemotherapy: Secondary | ICD-10-CM | POA: Diagnosis not present

## 2022-03-18 DIAGNOSIS — Z5112 Encounter for antineoplastic immunotherapy: Secondary | ICD-10-CM

## 2022-03-18 DIAGNOSIS — C3482 Malignant neoplasm of overlapping sites of left bronchus and lung: Secondary | ICD-10-CM | POA: Insufficient documentation

## 2022-03-18 DIAGNOSIS — K5900 Constipation, unspecified: Secondary | ICD-10-CM | POA: Diagnosis not present

## 2022-03-18 DIAGNOSIS — Z79899 Other long term (current) drug therapy: Secondary | ICD-10-CM | POA: Diagnosis not present

## 2022-03-18 DIAGNOSIS — R5383 Other fatigue: Secondary | ICD-10-CM

## 2022-03-18 DIAGNOSIS — C782 Secondary malignant neoplasm of pleura: Secondary | ICD-10-CM | POA: Insufficient documentation

## 2022-03-18 DIAGNOSIS — C3490 Malignant neoplasm of unspecified part of unspecified bronchus or lung: Secondary | ICD-10-CM

## 2022-03-18 DIAGNOSIS — J910 Malignant pleural effusion: Secondary | ICD-10-CM | POA: Diagnosis not present

## 2022-03-18 DIAGNOSIS — Z452 Encounter for adjustment and management of vascular access device: Secondary | ICD-10-CM | POA: Diagnosis not present

## 2022-03-18 DIAGNOSIS — I959 Hypotension, unspecified: Secondary | ICD-10-CM

## 2022-03-18 LAB — CMP (CANCER CENTER ONLY)
ALT: 11 U/L (ref 0–44)
AST: 16 U/L (ref 15–41)
Albumin: 3.1 g/dL — ABNORMAL LOW (ref 3.5–5.0)
Alkaline Phosphatase: 83 U/L (ref 38–126)
Anion gap: 4 — ABNORMAL LOW (ref 5–15)
BUN: 12 mg/dL (ref 8–23)
CO2: 29 mmol/L (ref 22–32)
Calcium: 8.8 mg/dL — ABNORMAL LOW (ref 8.9–10.3)
Chloride: 105 mmol/L (ref 98–111)
Creatinine: 0.62 mg/dL (ref 0.61–1.24)
GFR, Estimated: >60 mL/min (ref >60)
Glucose, Bld: 100 mg/dL — ABNORMAL HIGH (ref 70–99)
Potassium: 4.5 mmol/L (ref 3.5–5.1)
Sodium: 138 mmol/L (ref 135–145)
Total Bilirubin: 0.3 mg/dL (ref 0.3–1.2)
Total Protein: 6.1 g/dL — ABNORMAL LOW (ref 6.5–8.1)

## 2022-03-18 LAB — CBC WITH DIFFERENTIAL (CANCER CENTER ONLY)
Abs Immature Granulocytes: 0.36 10*3/uL — ABNORMAL HIGH (ref 0.00–0.07)
Basophils Absolute: 0 10*3/uL (ref 0.0–0.1)
Basophils Relative: 1 %
Eosinophils Absolute: 0.1 10*3/uL (ref 0.0–0.5)
Eosinophils Relative: 1 %
HCT: 35.5 % — ABNORMAL LOW (ref 39.0–52.0)
Hemoglobin: 11.2 g/dL — ABNORMAL LOW (ref 13.0–17.0)
Immature Granulocytes: 5 %
Lymphocytes Relative: 28 %
Lymphs Abs: 1.9 10*3/uL (ref 0.7–4.0)
MCH: 28.2 pg (ref 26.0–34.0)
MCHC: 31.5 g/dL (ref 30.0–36.0)
MCV: 89.4 fL (ref 80.0–100.0)
Monocytes Absolute: 1.2 10*3/uL — ABNORMAL HIGH (ref 0.1–1.0)
Monocytes Relative: 18 %
Neutro Abs: 3.2 10*3/uL (ref 1.7–7.7)
Neutrophils Relative %: 47 %
Platelet Count: 382 10*3/uL (ref 150–400)
RBC: 3.97 MIL/uL — ABNORMAL LOW (ref 4.22–5.81)
RDW: 16.6 % — ABNORMAL HIGH (ref 11.5–15.5)
WBC Count: 6.7 10*3/uL (ref 4.0–10.5)
nRBC: 0 % (ref 0.0–0.2)

## 2022-03-18 MED ORDER — TRILACICLIB DIHYDROCHLORIDE INJECTION 300 MG
240.0000 mg/m2 | Freq: Once | INTRAVENOUS | Status: DC
  Filled 2022-03-18: qty 27

## 2022-03-18 MED ORDER — PALONOSETRON HCL INJECTION 0.25 MG/5ML
0.2500 mg | Freq: Once | INTRAVENOUS | Status: AC
  Administered 2022-03-18: 0.25 mg via INTRAVENOUS
  Filled 2022-03-18: qty 5

## 2022-03-18 MED ORDER — SODIUM CHLORIDE 0.9 % IV SOLN: Freq: Once | INTRAVENOUS | Status: AC

## 2022-03-18 MED ORDER — SODIUM CHLORIDE 0.9 % IV SOLN
1500.0000 mg | Freq: Once | INTRAVENOUS | Status: AC
  Administered 2022-03-18: 1500 mg via INTRAVENOUS
  Filled 2022-03-18: qty 30

## 2022-03-18 MED ORDER — SODIUM CHLORIDE 0.9 % IV SOLN
150.0000 mg | Freq: Once | INTRAVENOUS | Status: AC
  Administered 2022-03-18: 150 mg via INTRAVENOUS
  Filled 2022-03-18: qty 150

## 2022-03-18 MED ORDER — SODIUM CHLORIDE 0.9 % IV SOLN
80.0000 mg/m2 | Freq: Once | INTRAVENOUS | Status: AC
  Administered 2022-03-18: 130 mg via INTRAVENOUS
  Filled 2022-03-18: qty 6.5

## 2022-03-18 MED ORDER — HEPARIN SOD (PORK) LOCK FLUSH 100 UNIT/ML IV SOLN
500.0000 [IU] | Freq: Once | INTRAVENOUS | Status: AC | PRN
  Administered 2022-03-18: 500 [IU]

## 2022-03-18 MED ORDER — TRILACICLIB DIHYDROCHLORIDE INJECTION 300 MG
240.0000 mg/m2 | Freq: Once | INTRAVENOUS | Status: AC
  Administered 2022-03-18: 405 mg via INTRAVENOUS
  Filled 2022-03-18: qty 27

## 2022-03-18 MED ORDER — SODIUM CHLORIDE 0.9% FLUSH
10.0000 mL | INTRAVENOUS | Status: DC | PRN
  Administered 2022-03-18: 10 mL

## 2022-03-18 MED ORDER — LIDOCAINE-PRILOCAINE 2.5-2.5 % EX CREA: 1.0000 | TOPICAL_CREAM | CUTANEOUS | 2 refills | Status: DC | PRN

## 2022-03-18 MED ORDER — SODIUM CHLORIDE 0.9 % IV SOLN
10.0000 mg | Freq: Once | INTRAVENOUS | Status: AC
  Administered 2022-03-18: 10 mg via INTRAVENOUS
  Filled 2022-03-18: qty 10

## 2022-03-18 MED ORDER — SODIUM CHLORIDE 0.9 % IV SOLN
296.8000 mg | Freq: Once | INTRAVENOUS | Status: AC
  Administered 2022-03-18: 300 mg via INTRAVENOUS
  Filled 2022-03-18: qty 30

## 2022-03-18 NOTE — Progress Notes (Signed)
Nutrition Follow-up:  Patient with extensive stage small cell lung cancer. He is receiving chemoimmunotherapy q21d.   Met with patient during infusion. Wife is present for visit today. Patient endorses good appetite. He is eating 3 meals + snacks with good sources of protein. Patient is drinking Ensure daily. Patient denies nausea, vomiting, diarrhea. Constipation has resolved.   Medications: reviewed   Labs: reviewed   Anthropometrics: Weight 125 lb 3.2 today   7/11 - 124 lb 8 oz  6/27 - 128 lb 3.2 oz  6/5 - 135 lb 14.4 oz    NUTRITION DIAGNOSIS: Severe malnutrition continues    INTERVENTION:  Continue small frequent meals and snacks with adequate calories and protein Continue drinking Ensure Plus/equivalent - recommend 2/day   MONITORING, EVALUATION, GOAL: weight trends, intake    NEXT VISIT: To be scheduled as needed

## 2022-03-18 NOTE — Patient Instructions (Addendum)
[facility]  Discharge Instructions: Thank you for choosing [facility] to provide your oncology and hematology care.   If you have a lab appointment with the [facility], please go directly to the [facility] and check in at the registration area.   Wear comfortable clothing and clothing appropriate for easy access to any Portacath or PICC line.   We strive to give you quality time with your provider. You may need to reschedule your appointment if you arrive late (15 or more minutes).  Arriving late affects you and other patients whose appointments are after yours.  Also, if you miss three or more appointments without notifying the office, you may be dismissed from the clinic at the provider's discretion.      For prescription refill requests, have your pharmacy contact our office and allow 72 hours for refills to be completed.    Today you received the following chemotherapy and/or immunotherapy agents: Durvalumab, carboplatin, and etoposide      To help prevent nausea and vomiting after your treatment, we encourage you to take your nausea medication as directed.  BELOW ARE SYMPTOMS THAT SHOULD BE REPORTED IMMEDIATELY: *FEVER GREATER THAN 100.4 F (38 C) OR HIGHER *CHILLS OR SWEATING *NAUSEA AND VOMITING THAT IS NOT CONTROLLED WITH YOUR NAUSEA MEDICATION *UNUSUAL SHORTNESS OF BREATH *UNUSUAL BRUISING OR BLEEDING *URINARY PROBLEMS (pain or burning when urinating, or frequent urination) *BOWEL PROBLEMS (unusual diarrhea, constipation, pain near the anus) TENDERNESS IN MOUTH AND THROAT WITH OR WITHOUT PRESENCE OF ULCERS (sore throat, sores in mouth, or a toothache) UNUSUAL RASH, SWELLING OR PAIN  UNUSUAL VAGINAL DISCHARGE OR ITCHING   Items with * indicate a potential emergency and should be followed up as soon as possible or go to the Emergency Department if any problems should occur.  Please show the CHEMOTHERAPY ALERT CARD or  IMMUNOTHERAPY ALERT CARD at check-in to the Emergency Department and triage nurse.  Should you have questions after your visit or need to cancel or reschedule your appointment, please contact [facility]  Dept: [phone]  and follow the prompts.  Office hours are 8:00 a.m. to 4:30 p.m. Monday - Friday. Please note that voicemails left after 4:00 p.m. may not be returned until the following business day.  We are closed weekends and major holidays. You have access to a nurse at all times for urgent questions. Please call the main number to the clinic Dept: [phone] and follow the prompts.   For any non-urgent questions, you may also contact your provider using MyChart. We now offer e-Visits for anyone 74 and older to request care online for non-urgent symptoms. For details visit [email or web address].   Also download the MyChart app! Go to the app store, search "MyChart", open the app, select [facility], and log in with your MyChart username and password.  Masks are optional in the cancer centers. If you would like for your care team to wear a mask while they are taking care of you, please let them know. You may have one support person who is at least 81 years old accompany you for your appointments.  Rehydration, Adult Rehydration is the replacement of body fluids, salts, and minerals (electrolytes) that are lost during dehydration. Dehydration is when there is not enough water or other fluids in the body. This happens when you lose more fluids than you take in. Common causes of dehydration include: Not drinking enough fluids. This can occur when you are  ill or doing activities that require a lot of energy, especially in hot weather. Conditions that cause loss of water or other fluids, such as diarrhea, vomiting, sweating, or urinating a lot. Other illnesses, such as fever or infection. Certain medicines, such as those that remove excess fluid from the body  (diuretics). Symptoms of mild or moderate dehydration may include thirst, dry lips and mouth, and dizziness. Symptoms of severe dehydration may include increased heart rate, confusion, fainting, and not urinating. For severe dehydration, you may need to get fluids through an IV at the hospital. For mild or moderate dehydration, you can usually rehydrate at home by drinking certain fluids as told by your health care provider. What are the risks? Generally, rehydration is safe. However, taking in too much fluid (overhydration) can be a problem. This is rare. Overhydration can cause an electrolyte imbalance, kidney failure, or a decrease in salt (sodium) levels in the body. Supplies needed You will need an oral rehydration solution (ORS) if your health care provider tells you to use one. This is a drink to treat dehydration. It can be found in pharmacies and retail stores. How to rehydrate Fluids Follow instructions from your health care provider for rehydration. The kind of fluid and the amount you should drink depend on your condition. In general, you should choose drinks that you prefer. If told by your health care provider, drink an ORS. Make an ORS by following instructions on the package. Start by drinking small amounts, about  cup (120 mL) every 5-10 minutes. Slowly increase how much you drink until you have taken the amount recommended by your health care provider. Drink enough clear fluids to keep your urine pale yellow. If you were told to drink an ORS, finish it first, then start slowly drinking other clear fluids. Drink fluids such as: Water. This includes sparkling water and flavored water. Drinking only water can lead to having too little sodium in your body (hyponatremia). Follow the advice of your health care provider. Water from ice chips you suck on. Fruit juice with water you add to it (diluted). Sports drinks. Hot or cold herbal teas. Broth-based soups. Milk or milk  products. Food Follow instructions from your health care provider about what to eat while you rehydrate. Your health care provider may recommend that you slowly begin eating regular foods in small amounts. Eat foods that contain a healthy balance of electrolytes, such as bananas, oranges, potatoes, tomatoes, and spinach. Avoid foods that are greasy or contain a lot of sugar. In some cases, you may get nutrition through a feeding tube that is passed through your nose and into your stomach (nasogastric tube, or NG tube). This may be done if you have uncontrolled vomiting or diarrhea. Beverages to avoid  Certain beverages may make dehydration worse. While you rehydrate, avoid drinking alcohol. How to tell if you are recovering from dehydration You may be recovering from dehydration if: You are urinating more often than before you started rehydrating. Your urine is pale yellow. Your energy level improves. You vomit less frequently. You have diarrhea less frequently. Your appetite improves or returns to normal. You feel less dizzy or less light-headed. Your skin tone and color start to look more normal. Follow these instructions at home: Take over-the-counter and prescription medicines only as told by your health care provider. Do not take sodium tablets. Doing this can lead to having too much sodium in your body (hypernatremia). Contact a health care provider if: You continue to have symptoms  of mild or moderate dehydration, such as: Thirst. Dry lips. Slightly dry mouth. Dizziness. Dark urine or less urine than normal. Muscle cramps. You continue to vomit or have diarrhea. Get help right away if you: Have symptoms of dehydration that get worse. Have a fever. Have a severe headache. Have been vomiting and the following happens: Your vomiting gets worse or does not go away. Your vomit includes blood or green matter (bile). You cannot eat or drink without vomiting. Have problems with  urination or bowel movements, such as: Diarrhea that gets worse or does not go away. Blood in your stool (feces). This may cause stool to look black and tarry. Not urinating, or urinating only a small amount of very dark urine, within 6-8 hours. Have trouble breathing. Have symptoms that get worse with treatment. These symptoms may represent a serious problem that is an emergency. Do not wait to see if the symptoms will go away. Get medical help right away. Call your local emergency services (911 in the [place] Do not drive yourself to the hospital. Summary Rehydration is the replacement of body fluids and minerals (electrolytes) that are lost during dehydration. Follow instructions from your health care provider for rehydration. The kind of fluid and amount you should drink depend on your condition. Slowly increase how much you drink until you have taken the amount recommended by your health care provider. Contact your health care provider if you continue to show signs of mild or moderate dehydration. This information is not intended to replace advice given to you by your health care provider. Make sure you discuss any questions you have with your health care provider. Document Revised: 10/05/2019 Document Reviewed: 08/15/2019 Elsevier Patient Education  [place].

## 2022-03-18 NOTE — Addendum Note (Signed)
Addended by: Dicie Beam D on: 03/18/2022 11:24 AM   Modules accepted: Orders

## 2022-03-19 ENCOUNTER — Inpatient Hospital Stay: Payer: Medicare Other

## 2022-03-19 ENCOUNTER — Ambulatory Visit: Payer: Medicare Other

## 2022-03-19 VITALS — BP 112/53 | HR 65 | Temp 97.9°F | Resp 18

## 2022-03-19 DIAGNOSIS — Z5111 Encounter for antineoplastic chemotherapy: Secondary | ICD-10-CM | POA: Diagnosis not present

## 2022-03-19 DIAGNOSIS — Z5112 Encounter for antineoplastic immunotherapy: Secondary | ICD-10-CM | POA: Diagnosis not present

## 2022-03-19 DIAGNOSIS — J910 Malignant pleural effusion: Secondary | ICD-10-CM | POA: Diagnosis not present

## 2022-03-19 DIAGNOSIS — C782 Secondary malignant neoplasm of pleura: Secondary | ICD-10-CM | POA: Diagnosis not present

## 2022-03-19 DIAGNOSIS — C3482 Malignant neoplasm of overlapping sites of left bronchus and lung: Secondary | ICD-10-CM | POA: Diagnosis not present

## 2022-03-19 DIAGNOSIS — K5900 Constipation, unspecified: Secondary | ICD-10-CM | POA: Diagnosis not present

## 2022-03-19 MED ORDER — SODIUM CHLORIDE 0.9 % IV SOLN
80.0000 mg/m2 | Freq: Once | INTRAVENOUS | Status: AC
  Administered 2022-03-19: 130 mg via INTRAVENOUS
  Filled 2022-03-19: qty 6.5

## 2022-03-19 MED ORDER — SODIUM CHLORIDE 0.9 % IV SOLN
10.0000 mg | Freq: Once | INTRAVENOUS | Status: AC
  Administered 2022-03-19: 10 mg via INTRAVENOUS
  Filled 2022-03-19: qty 10

## 2022-03-19 MED ORDER — SODIUM CHLORIDE 0.9 % IV SOLN: Freq: Once | INTRAVENOUS | Status: AC

## 2022-03-19 MED ORDER — TRILACICLIB DIHYDROCHLORIDE INJECTION 300 MG
240.0000 mg/m2 | Freq: Once | INTRAVENOUS | Status: AC
  Administered 2022-03-19: 405 mg via INTRAVENOUS
  Filled 2022-03-19: qty 27

## 2022-03-19 MED ORDER — SODIUM CHLORIDE 0.9% FLUSH
10.0000 mL | INTRAVENOUS | Status: DC | PRN
  Administered 2022-03-19: 10 mL

## 2022-03-19 MED ORDER — HEPARIN SOD (PORK) LOCK FLUSH 100 UNIT/ML IV SOLN
500.0000 [IU] | Freq: Once | INTRAVENOUS | Status: AC | PRN
  Administered 2022-03-19: 500 [IU]

## 2022-03-19 MED FILL — Dexamethasone Sodium Phosphate Inj 100 MG/10ML: INTRAMUSCULAR | Qty: 1 | Status: AC

## 2022-03-19 NOTE — Patient Instructions (Signed)
[facility]  Discharge Instructions: Thank you for choosing [facility] to provide your oncology and hematology care.   If you have a lab appointment with the [facility], please go directly to the [facility] and check in at the registration area.   Wear comfortable clothing and clothing appropriate for easy access to any Portacath or PICC line.   We strive to give you quality time with your provider. You may need to reschedule your appointment if you arrive late (15 or more minutes).  Arriving late affects you and other patients whose appointments are after yours.  Also, if you miss three or more appointments without notifying the office, you may be dismissed from the clinic at the provider's discretion.      For prescription refill requests, have your pharmacy contact our office and allow 72 hours for refills to be completed.    Today you received the following chemotherapy and/or immunotherapy agents: Cosela, Etoposide      To help prevent nausea and vomiting after your treatment, we encourage you to take your nausea medication as directed.  BELOW ARE SYMPTOMS THAT SHOULD BE REPORTED IMMEDIATELY: *FEVER GREATER THAN 100.4 F (38 C) OR HIGHER *CHILLS OR SWEATING *NAUSEA AND VOMITING THAT IS NOT CONTROLLED WITH YOUR NAUSEA MEDICATION *UNUSUAL SHORTNESS OF BREATH *UNUSUAL BRUISING OR BLEEDING *URINARY PROBLEMS (pain or burning when urinating, or frequent urination) *BOWEL PROBLEMS (unusual diarrhea, constipation, pain near the anus) TENDERNESS IN MOUTH AND THROAT WITH OR WITHOUT PRESENCE OF ULCERS (sore throat, sores in mouth, or a toothache) UNUSUAL RASH, SWELLING OR PAIN  UNUSUAL VAGINAL DISCHARGE OR ITCHING   Items with * indicate a potential emergency and should be followed up as soon as possible or go to the Emergency Department if any problems should occur.  Please show the CHEMOTHERAPY ALERT CARD or IMMUNOTHERAPY ALERT CARD at  check-in to the Emergency Department and triage nurse.  Should you have questions after your visit or need to cancel or reschedule your appointment, please contact [facility]  Dept: [phone]  and follow the prompts.  Office hours are 8:00 a.m. to 4:30 p.m. Monday - Friday. Please note that voicemails left after 4:00 p.m. may not be returned until the following business day.  We are closed weekends and major holidays. You have access to a nurse at all times for urgent questions. Please call the main number to the clinic Dept: [phone] and follow the prompts.   For any non-urgent questions, you may also contact your provider using MyChart. We now offer e-Visits for anyone 63 and older to request care online for non-urgent symptoms. For details visit [email or web address].   Also download the MyChart app! Go to the app store, search "MyChart", open the app, select [facility], and log in with your MyChart username and password.  Masks are optional in the cancer centers. If you would like for your care team to wear a mask while they are taking care of you, please let them know. You may have one support person who is at least 81 years old accompany you for your appointments.  Rehydration, Adult Rehydration is the replacement of body fluids, salts, and minerals (electrolytes) that are lost during dehydration. Dehydration is when there is not enough water or other fluids in the body. This happens when you lose more fluids than you take in. Common causes of dehydration include: Not drinking enough fluids. This can occur when you are ill or  doing activities that require a lot of energy, especially in hot weather. Conditions that cause loss of water or other fluids, such as diarrhea, vomiting, sweating, or urinating a lot. Other illnesses, such as fever or infection. Certain medicines, such as those that remove excess fluid from the body (diuretics). Symptoms of mild  or moderate dehydration may include thirst, dry lips and mouth, and dizziness. Symptoms of severe dehydration may include increased heart rate, confusion, fainting, and not urinating. For severe dehydration, you may need to get fluids through an IV at the hospital. For mild or moderate dehydration, you can usually rehydrate at home by drinking certain fluids as told by your health care provider. What are the risks? Generally, rehydration is safe. However, taking in too much fluid (overhydration) can be a problem. This is rare. Overhydration can cause an electrolyte imbalance, kidney failure, or a decrease in salt (sodium) levels in the body. Supplies needed You will need an oral rehydration solution (ORS) if your health care provider tells you to use one. This is a drink to treat dehydration. It can be found in pharmacies and retail stores. How to rehydrate Fluids Follow instructions from your health care provider for rehydration. The kind of fluid and the amount you should drink depend on your condition. In general, you should choose drinks that you prefer. If told by your health care provider, drink an ORS. Make an ORS by following instructions on the package. Start by drinking small amounts, about  cup (120 mL) every 5-10 minutes. Slowly increase how much you drink until you have taken the amount recommended by your health care provider. Drink enough clear fluids to keep your urine pale yellow. If you were told to drink an ORS, finish it first, then start slowly drinking other clear fluids. Drink fluids such as: Water. This includes sparkling water and flavored water. Drinking only water can lead to having too little sodium in your body (hyponatremia). Follow the advice of your health care provider. Water from ice chips you suck on. Fruit juice with water you add to it (diluted). Sports drinks. Hot or cold herbal teas. Broth-based soups. Milk or milk products. Food Follow instructions from  your health care provider about what to eat while you rehydrate. Your health care provider may recommend that you slowly begin eating regular foods in small amounts. Eat foods that contain a healthy balance of electrolytes, such as bananas, oranges, potatoes, tomatoes, and spinach. Avoid foods that are greasy or contain a lot of sugar. In some cases, you may get nutrition through a feeding tube that is passed through your nose and into your stomach (nasogastric tube, or NG tube). This may be done if you have uncontrolled vomiting or diarrhea. Beverages to avoid  Certain beverages may make dehydration worse. While you rehydrate, avoid drinking alcohol. How to tell if you are recovering from dehydration You may be recovering from dehydration if: You are urinating more often than before you started rehydrating. Your urine is pale yellow. Your energy level improves. You vomit less frequently. You have diarrhea less frequently. Your appetite improves or returns to normal. You feel less dizzy or less light-headed. Your skin tone and color start to look more normal. Follow these instructions at home: Take over-the-counter and prescription medicines only as told by your health care provider. Do not take sodium tablets. Doing this can lead to having too much sodium in your body (hypernatremia). Contact a health care provider if: You continue to have symptoms of mild  or moderate dehydration, such as: Thirst. Dry lips. Slightly dry mouth. Dizziness. Dark urine or less urine than normal. Muscle cramps. You continue to vomit or have diarrhea. Get help right away if you: Have symptoms of dehydration that get worse. Have a fever. Have a severe headache. Have been vomiting and the following happens: Your vomiting gets worse or does not go away. Your vomit includes blood or green matter (bile). You cannot eat or drink without vomiting. Have problems with urination or bowel movements, such  as: Diarrhea that gets worse or does not go away. Blood in your stool (feces). This may cause stool to look black and tarry. Not urinating, or urinating only a small amount of very dark urine, within 6-8 hours. Have trouble breathing. Have symptoms that get worse with treatment. These symptoms may represent a serious problem that is an emergency. Do not wait to see if the symptoms will go away. Get medical help right away. Call your local emergency services (911 in the [place] Do not drive yourself to the hospital. Summary Rehydration is the replacement of body fluids and minerals (electrolytes) that are lost during dehydration. Follow instructions from your health care provider for rehydration. The kind of fluid and amount you should drink depend on your condition. Slowly increase how much you drink until you have taken the amount recommended by your health care provider. Contact your health care provider if you continue to show signs of mild or moderate dehydration. This information is not intended to replace advice given to you by your health care provider. Make sure you discuss any questions you have with your health care provider. Document Revised: 10/05/2019 Document Reviewed: 08/15/2019 Elsevier Patient Education  [place].

## 2022-03-20 ENCOUNTER — Other Ambulatory Visit: Payer: Self-pay

## 2022-03-20 ENCOUNTER — Ambulatory Visit: Payer: Medicare Other

## 2022-03-20 ENCOUNTER — Inpatient Hospital Stay: Payer: Medicare Other

## 2022-03-20 VITALS — BP 115/60 | HR 62 | Temp 97.8°F | Resp 17

## 2022-03-20 DIAGNOSIS — C3482 Malignant neoplasm of overlapping sites of left bronchus and lung: Secondary | ICD-10-CM

## 2022-03-20 DIAGNOSIS — Z48813 Encounter for surgical aftercare following surgery on the respiratory system: Secondary | ICD-10-CM | POA: Diagnosis not present

## 2022-03-20 DIAGNOSIS — K5900 Constipation, unspecified: Secondary | ICD-10-CM | POA: Diagnosis not present

## 2022-03-20 DIAGNOSIS — Z4801 Encounter for change or removal of surgical wound dressing: Secondary | ICD-10-CM | POA: Diagnosis not present

## 2022-03-20 DIAGNOSIS — C782 Secondary malignant neoplasm of pleura: Secondary | ICD-10-CM | POA: Diagnosis not present

## 2022-03-20 DIAGNOSIS — J910 Malignant pleural effusion: Secondary | ICD-10-CM | POA: Diagnosis not present

## 2022-03-20 DIAGNOSIS — J942 Hemothorax: Secondary | ICD-10-CM | POA: Diagnosis not present

## 2022-03-20 DIAGNOSIS — Z5112 Encounter for antineoplastic immunotherapy: Secondary | ICD-10-CM | POA: Diagnosis not present

## 2022-03-20 DIAGNOSIS — Z5111 Encounter for antineoplastic chemotherapy: Secondary | ICD-10-CM | POA: Diagnosis not present

## 2022-03-20 DIAGNOSIS — C3490 Malignant neoplasm of unspecified part of unspecified bronchus or lung: Secondary | ICD-10-CM | POA: Diagnosis not present

## 2022-03-20 DIAGNOSIS — J9601 Acute respiratory failure with hypoxia: Secondary | ICD-10-CM | POA: Diagnosis not present

## 2022-03-20 MED ORDER — SODIUM CHLORIDE 0.9 % IV SOLN: Freq: Once | INTRAVENOUS | Status: AC

## 2022-03-20 MED ORDER — SODIUM CHLORIDE 0.9 % IV SOLN
80.0000 mg/m2 | Freq: Once | INTRAVENOUS | Status: AC
  Administered 2022-03-20: 130 mg via INTRAVENOUS
  Filled 2022-03-20: qty 6.5

## 2022-03-20 MED ORDER — TRILACICLIB DIHYDROCHLORIDE INJECTION 300 MG
240.0000 mg/m2 | Freq: Once | INTRAVENOUS | Status: AC
  Administered 2022-03-20: 405 mg via INTRAVENOUS
  Filled 2022-03-20: qty 27

## 2022-03-20 MED ORDER — SODIUM CHLORIDE 0.9 % IV SOLN
10.0000 mg | Freq: Once | INTRAVENOUS | Status: AC
  Administered 2022-03-20: 10 mg via INTRAVENOUS
  Filled 2022-03-20: qty 10

## 2022-03-20 MED ORDER — HEPARIN SOD (PORK) LOCK FLUSH 100 UNIT/ML IV SOLN
500.0000 [IU] | Freq: Once | INTRAVENOUS | Status: AC | PRN
  Administered 2022-03-20: 500 [IU]

## 2022-03-20 MED ORDER — SODIUM CHLORIDE 0.9% FLUSH
10.0000 mL | INTRAVENOUS | Status: DC | PRN
  Administered 2022-03-20: 10 mL

## 2022-03-20 NOTE — Patient Instructions (Signed)
[facility]  Discharge Instructions: Thank you for choosing [facility] to provide your oncology and hematology care.   If you have a lab appointment with the [facility], please go directly to the [facility] and check in at the registration area.   Wear comfortable clothing and clothing appropriate for easy access to any Portacath or PICC line.   We strive to give you quality time with your provider. You may need to reschedule your appointment if you arrive late (15 or more minutes).  Arriving late affects you and other patients whose appointments are after yours.  Also, if you miss three or more appointments without notifying the office, you may be dismissed from the clinic at the provider's discretion.      For prescription refill requests, have your pharmacy contact our office and allow 72 hours for refills to be completed.    Today you received the following chemotherapy and/or immunotherapy agents: Cosela, Etoposide      To help prevent nausea and vomiting after your treatment, we encourage you to take your nausea medication as directed.  BELOW ARE SYMPTOMS THAT SHOULD BE REPORTED IMMEDIATELY: *FEVER GREATER THAN 100.4 F (38 C) OR HIGHER *CHILLS OR SWEATING *NAUSEA AND VOMITING THAT IS NOT CONTROLLED WITH YOUR NAUSEA MEDICATION *UNUSUAL SHORTNESS OF BREATH *UNUSUAL BRUISING OR BLEEDING *URINARY PROBLEMS (pain or burning when urinating, or frequent urination) *BOWEL PROBLEMS (unusual diarrhea, constipation, pain near the anus) TENDERNESS IN MOUTH AND THROAT WITH OR WITHOUT PRESENCE OF ULCERS (sore throat, sores in mouth, or a toothache) UNUSUAL RASH, SWELLING OR PAIN  UNUSUAL VAGINAL DISCHARGE OR ITCHING   Items with * indicate a potential emergency and should be followed up as soon as possible or go to the Emergency Department if any problems should occur.  Please show the CHEMOTHERAPY ALERT CARD or IMMUNOTHERAPY ALERT CARD at  check-in to the Emergency Department and triage nurse.  Should you have questions after your visit or need to cancel or reschedule your appointment, please contact [facility]  Dept: [phone]  and follow the prompts.  Office hours are 8:00 a.m. to 4:30 p.m. Monday - Friday. Please note that voicemails left after 4:00 p.m. may not be returned until the following business day.  We are closed weekends and major holidays. You have access to a nurse at all times for urgent questions. Please call the main number to the clinic Dept: [phone] and follow the prompts.   For any non-urgent questions, you may also contact your provider using MyChart. We now offer e-Visits for anyone 21 and older to request care online for non-urgent symptoms. For details visit [email or web address].   Also download the MyChart app! Go to the app store, search "MyChart", open the app, select [facility], and log in with your MyChart username and password.  Masks are optional in the cancer centers. If you would like for your care team to wear a mask while they are taking care of you, please let them know. You may have one support person who is at least 81 years old accompany you for your appointments.  Rehydration, Adult Rehydration is the replacement of body fluids, salts, and minerals (electrolytes) that are lost during dehydration. Dehydration is when there is not enough water or other fluids in the body. This happens when you lose more fluids than you take in. Common causes of dehydration include: Not drinking enough fluids. This can occur when you are ill or  doing activities that require a lot of energy, especially in hot weather. Conditions that cause loss of water or other fluids, such as diarrhea, vomiting, sweating, or urinating a lot. Other illnesses, such as fever or infection. Certain medicines, such as those that remove excess fluid from the body (diuretics). Symptoms of mild  or moderate dehydration may include thirst, dry lips and mouth, and dizziness. Symptoms of severe dehydration may include increased heart rate, confusion, fainting, and not urinating. For severe dehydration, you may need to get fluids through an IV at the hospital. For mild or moderate dehydration, you can usually rehydrate at home by drinking certain fluids as told by your health care provider. What are the risks? Generally, rehydration is safe. However, taking in too much fluid (overhydration) can be a problem. This is rare. Overhydration can cause an electrolyte imbalance, kidney failure, or a decrease in salt (sodium) levels in the body. Supplies needed You will need an oral rehydration solution (ORS) if your health care provider tells you to use one. This is a drink to treat dehydration. It can be found in pharmacies and retail stores. How to rehydrate Fluids Follow instructions from your health care provider for rehydration. The kind of fluid and the amount you should drink depend on your condition. In general, you should choose drinks that you prefer. If told by your health care provider, drink an ORS. Make an ORS by following instructions on the package. Start by drinking small amounts, about  cup (120 mL) every 5-10 minutes. Slowly increase how much you drink until you have taken the amount recommended by your health care provider. Drink enough clear fluids to keep your urine pale yellow. If you were told to drink an ORS, finish it first, then start slowly drinking other clear fluids. Drink fluids such as: Water. This includes sparkling water and flavored water. Drinking only water can lead to having too little sodium in your body (hyponatremia). Follow the advice of your health care provider. Water from ice chips you suck on. Fruit juice with water you add to it (diluted). Sports drinks. Hot or cold herbal teas. Broth-based soups. Milk or milk products. Food Follow instructions from  your health care provider about what to eat while you rehydrate. Your health care provider may recommend that you slowly begin eating regular foods in small amounts. Eat foods that contain a healthy balance of electrolytes, such as bananas, oranges, potatoes, tomatoes, and spinach. Avoid foods that are greasy or contain a lot of sugar. In some cases, you may get nutrition through a feeding tube that is passed through your nose and into your stomach (nasogastric tube, or NG tube). This may be done if you have uncontrolled vomiting or diarrhea. Beverages to avoid  Certain beverages may make dehydration worse. While you rehydrate, avoid drinking alcohol. How to tell if you are recovering from dehydration You may be recovering from dehydration if: You are urinating more often than before you started rehydrating. Your urine is pale yellow. Your energy level improves. You vomit less frequently. You have diarrhea less frequently. Your appetite improves or returns to normal. You feel less dizzy or less light-headed. Your skin tone and color start to look more normal. Follow these instructions at home: Take over-the-counter and prescription medicines only as told by your health care provider. Do not take sodium tablets. Doing this can lead to having too much sodium in your body (hypernatremia). Contact a health care provider if: You continue to have symptoms of mild  or moderate dehydration, such as: Thirst. Dry lips. Slightly dry mouth. Dizziness. Dark urine or less urine than normal. Muscle cramps. You continue to vomit or have diarrhea. Get help right away if you: Have symptoms of dehydration that get worse. Have a fever. Have a severe headache. Have been vomiting and the following happens: Your vomiting gets worse or does not go away. Your vomit includes blood or green matter (bile). You cannot eat or drink without vomiting. Have problems with urination or bowel movements, such  as: Diarrhea that gets worse or does not go away. Blood in your stool (feces). This may cause stool to look black and tarry. Not urinating, or urinating only a small amount of very dark urine, within 6-8 hours. Have trouble breathing. Have symptoms that get worse with treatment. These symptoms may represent a serious problem that is an emergency. Do not wait to see if the symptoms will go away. Get medical help right away. Call your local emergency services (911 in the [place] Do not drive yourself to the hospital. Summary Rehydration is the replacement of body fluids and minerals (electrolytes) that are lost during dehydration. Follow instructions from your health care provider for rehydration. The kind of fluid and amount you should drink depend on your condition. Slowly increase how much you drink until you have taken the amount recommended by your health care provider. Contact your health care provider if you continue to show signs of mild or moderate dehydration. This information is not intended to replace advice given to you by your health care provider. Make sure you discuss any questions you have with your health care provider. Document Revised: 10/05/2019 Document Reviewed: 08/15/2019 Elsevier Patient Education  [employer].

## 2022-03-24 DIAGNOSIS — J942 Hemothorax: Secondary | ICD-10-CM | POA: Diagnosis not present

## 2022-03-24 DIAGNOSIS — J9601 Acute respiratory failure with hypoxia: Secondary | ICD-10-CM | POA: Diagnosis not present

## 2022-03-24 DIAGNOSIS — C3490 Malignant neoplasm of unspecified part of unspecified bronchus or lung: Secondary | ICD-10-CM | POA: Diagnosis not present

## 2022-03-24 DIAGNOSIS — J910 Malignant pleural effusion: Secondary | ICD-10-CM | POA: Diagnosis not present

## 2022-03-24 DIAGNOSIS — Z4801 Encounter for change or removal of surgical wound dressing: Secondary | ICD-10-CM | POA: Diagnosis not present

## 2022-03-24 DIAGNOSIS — Z48813 Encounter for surgical aftercare following surgery on the respiratory system: Secondary | ICD-10-CM | POA: Diagnosis not present

## 2022-03-25 ENCOUNTER — Inpatient Hospital Stay: Payer: Medicare Other

## 2022-03-25 ENCOUNTER — Other Ambulatory Visit: Payer: Self-pay

## 2022-03-25 ENCOUNTER — Telehealth: Payer: Self-pay

## 2022-03-25 DIAGNOSIS — J910 Malignant pleural effusion: Secondary | ICD-10-CM | POA: Diagnosis not present

## 2022-03-25 DIAGNOSIS — C3490 Malignant neoplasm of unspecified part of unspecified bronchus or lung: Secondary | ICD-10-CM

## 2022-03-25 DIAGNOSIS — Z5112 Encounter for antineoplastic immunotherapy: Secondary | ICD-10-CM | POA: Diagnosis not present

## 2022-03-25 DIAGNOSIS — K5900 Constipation, unspecified: Secondary | ICD-10-CM | POA: Diagnosis not present

## 2022-03-25 DIAGNOSIS — C3482 Malignant neoplasm of overlapping sites of left bronchus and lung: Secondary | ICD-10-CM

## 2022-03-25 DIAGNOSIS — C782 Secondary malignant neoplasm of pleura: Secondary | ICD-10-CM | POA: Diagnosis not present

## 2022-03-25 DIAGNOSIS — Z5111 Encounter for antineoplastic chemotherapy: Secondary | ICD-10-CM | POA: Diagnosis not present

## 2022-03-25 DIAGNOSIS — R5383 Other fatigue: Secondary | ICD-10-CM

## 2022-03-25 LAB — CBC WITH DIFFERENTIAL (CANCER CENTER ONLY)
Abs Immature Granulocytes: 0.11 10*3/uL — ABNORMAL HIGH (ref 0.00–0.07)
Basophils Absolute: 0 10*3/uL (ref 0.0–0.1)
Basophils Relative: 0 %
Eosinophils Absolute: 0.1 10*3/uL (ref 0.0–0.5)
Eosinophils Relative: 1 %
HCT: 32.5 % — ABNORMAL LOW (ref 39.0–52.0)
Hemoglobin: 10.6 g/dL — ABNORMAL LOW (ref 13.0–17.0)
Immature Granulocytes: 1 %
Lymphocytes Relative: 22 %
Lymphs Abs: 1.8 10*3/uL (ref 0.7–4.0)
MCH: 28.7 pg (ref 26.0–34.0)
MCHC: 32.6 g/dL (ref 30.0–36.0)
MCV: 88.1 fL (ref 80.0–100.0)
Monocytes Absolute: 0.2 10*3/uL (ref 0.1–1.0)
Monocytes Relative: 2 %
Neutro Abs: 6 10*3/uL (ref 1.7–7.7)
Neutrophils Relative %: 74 %
Platelet Count: 296 10*3/uL (ref 150–400)
RBC: 3.69 MIL/uL — ABNORMAL LOW (ref 4.22–5.81)
RDW: 16.4 % — ABNORMAL HIGH (ref 11.5–15.5)
WBC Count: 8.2 10*3/uL (ref 4.0–10.5)
nRBC: 0 % (ref 0.0–0.2)

## 2022-03-25 LAB — CMP (CANCER CENTER ONLY)
ALT: 9 U/L (ref 0–44)
AST: 14 U/L — ABNORMAL LOW (ref 15–41)
Albumin: 3.2 g/dL — ABNORMAL LOW (ref 3.5–5.0)
Alkaline Phosphatase: 80 U/L (ref 38–126)
Anion gap: 3 — ABNORMAL LOW (ref 5–15)
BUN: 15 mg/dL (ref 8–23)
CO2: 32 mmol/L (ref 22–32)
Calcium: 8.5 mg/dL — ABNORMAL LOW (ref 8.9–10.3)
Chloride: 103 mmol/L (ref 98–111)
Creatinine: 0.6 mg/dL — ABNORMAL LOW (ref 0.61–1.24)
GFR, Estimated: >60 mL/min (ref >60)
Glucose, Bld: 115 mg/dL — ABNORMAL HIGH (ref 70–99)
Potassium: 3.9 mmol/L (ref 3.5–5.1)
Sodium: 138 mmol/L (ref 135–145)
Total Bilirubin: 0.3 mg/dL (ref 0.3–1.2)
Total Protein: 5.9 g/dL — ABNORMAL LOW (ref 6.5–8.1)

## 2022-03-25 LAB — TSH: TSH: 4.244 u[IU]/mL (ref 0.350–4.500)

## 2022-03-25 MED ORDER — HEPARIN SOD (PORK) LOCK FLUSH 100 UNIT/ML IV SOLN
500.0000 [IU] | Freq: Once | INTRAVENOUS | Status: AC
  Administered 2022-03-25: 500 [IU] via INTRAVENOUS

## 2022-03-25 MED ORDER — SODIUM CHLORIDE 0.9% FLUSH
10.0000 mL | INTRAVENOUS | Status: DC | PRN
  Administered 2022-03-25: 10 mL via INTRAVENOUS

## 2022-03-25 NOTE — Telephone Encounter (Signed)
Eustaquio Maize, RN with [facility] contacted the office [phone] stating she was discharging patient from home health. Patient has pleurx catheter in place and patient and family well educated on how to drain. Patient is currently draining weekly  and past three drains patient has yielded about 5 ml's collectively. Patient is not experiencing shortness of breath and uses 4LNC daily. Patient does have a virtual follow up with Dr. Kipp Brood this Friday and is aware of discussing with Dr. Kipp Brood possible removal/next steps.

## 2022-03-28 ENCOUNTER — Ambulatory Visit (INDEPENDENT_AMBULATORY_CARE_PROVIDER_SITE_OTHER): Payer: Medicare Other | Admitting: Thoracic Surgery (Cardiothoracic Vascular Surgery)

## 2022-03-28 DIAGNOSIS — C3482 Malignant neoplasm of overlapping sites of left bronchus and lung: Secondary | ICD-10-CM | POA: Diagnosis not present

## 2022-03-28 NOTE — Progress Notes (Signed)
     [address]       [place] [address]             [phone]       Patient: Home Provider: Office Consent for Telemedicine visit obtained.  Today's visit was completed via a real-time telehealth (see specific modality noted below). The patient/authorized person provided oral consent at the time of the visit to engage in a telemedicine encounter with the present provider at [facility]. The patient/authorized person was informed of the potential benefits, limitations, and risks of telemedicine. The patient/authorized person expressed understanding that the laws that protect confidentiality also apply to telemedicine. The patient/authorized person acknowledged understanding that telemedicine does not provide emergency services and that he or she would need to call 911 or proceed to the nearest hospital for help if such a need arose.   Total time spent in the clinical discussion 10 minutes.  Telehealth Modality: Phone visit (audio only)  I had a telephone visit with Pedro Pope.  He continues to have little to no drainage from his Pleurx catheter.  His last set of imaging shows resolution of pleural effusion.  We will bring him back to clinic for Pleurx catheter removal.

## 2022-04-01 ENCOUNTER — Inpatient Hospital Stay: Payer: Medicare Other

## 2022-04-01 ENCOUNTER — Other Ambulatory Visit: Payer: Self-pay

## 2022-04-01 ENCOUNTER — Ambulatory Visit (INDEPENDENT_AMBULATORY_CARE_PROVIDER_SITE_OTHER): Payer: Self-pay | Admitting: Physician Assistant

## 2022-04-01 VITALS — BP 106/65 | HR 81 | Resp 18 | Ht 69.0 in

## 2022-04-01 DIAGNOSIS — Z5112 Encounter for antineoplastic immunotherapy: Secondary | ICD-10-CM | POA: Diagnosis not present

## 2022-04-01 DIAGNOSIS — J910 Malignant pleural effusion: Secondary | ICD-10-CM | POA: Diagnosis not present

## 2022-04-01 DIAGNOSIS — C3482 Malignant neoplasm of overlapping sites of left bronchus and lung: Secondary | ICD-10-CM | POA: Diagnosis not present

## 2022-04-01 DIAGNOSIS — C782 Secondary malignant neoplasm of pleura: Secondary | ICD-10-CM | POA: Diagnosis not present

## 2022-04-01 DIAGNOSIS — Z5111 Encounter for antineoplastic chemotherapy: Secondary | ICD-10-CM | POA: Diagnosis not present

## 2022-04-01 DIAGNOSIS — Z09 Encounter for follow-up examination after completed treatment for conditions other than malignant neoplasm: Secondary | ICD-10-CM

## 2022-04-01 DIAGNOSIS — Z95828 Presence of other vascular implants and grafts: Secondary | ICD-10-CM

## 2022-04-01 DIAGNOSIS — C3490 Malignant neoplasm of unspecified part of unspecified bronchus or lung: Secondary | ICD-10-CM

## 2022-04-01 DIAGNOSIS — K5900 Constipation, unspecified: Secondary | ICD-10-CM | POA: Diagnosis not present

## 2022-04-01 LAB — CMP (CANCER CENTER ONLY)
ALT: 13 U/L (ref 0–44)
AST: 18 U/L (ref 15–41)
Albumin: 3 g/dL — ABNORMAL LOW (ref 3.5–5.0)
Alkaline Phosphatase: 81 U/L (ref 38–126)
Anion gap: 6 (ref 5–15)
BUN: 17 mg/dL (ref 8–23)
CO2: 26 mmol/L (ref 22–32)
Calcium: 8.9 mg/dL (ref 8.9–10.3)
Chloride: 108 mmol/L (ref 98–111)
Creatinine: 0.88 mg/dL (ref 0.61–1.24)
GFR, Estimated: >60 mL/min (ref >60)
Glucose, Bld: 142 mg/dL — ABNORMAL HIGH (ref 70–99)
Potassium: 3.6 mmol/L (ref 3.5–5.1)
Sodium: 140 mmol/L (ref 135–145)
Total Bilirubin: 0.4 mg/dL (ref 0.3–1.2)
Total Protein: 6.1 g/dL — ABNORMAL LOW (ref 6.5–8.1)

## 2022-04-01 LAB — CBC WITH DIFFERENTIAL (CANCER CENTER ONLY)
Abs Immature Granulocytes: 0.02 10*3/uL (ref 0.00–0.07)
Basophils Absolute: 0 10*3/uL (ref 0.0–0.1)
Basophils Relative: 0 %
Eosinophils Absolute: 0 10*3/uL (ref 0.0–0.5)
Eosinophils Relative: 1 %
HCT: 32.9 % — ABNORMAL LOW (ref 39.0–52.0)
Hemoglobin: 10.7 g/dL — ABNORMAL LOW (ref 13.0–17.0)
Immature Granulocytes: 0 %
Lymphocytes Relative: 30 %
Lymphs Abs: 1.8 10*3/uL (ref 0.7–4.0)
MCH: 28.7 pg (ref 26.0–34.0)
MCHC: 32.5 g/dL (ref 30.0–36.0)
MCV: 88.2 fL (ref 80.0–100.0)
Monocytes Absolute: 0.8 10*3/uL (ref 0.1–1.0)
Monocytes Relative: 13 %
Neutro Abs: 3.4 10*3/uL (ref 1.7–7.7)
Neutrophils Relative %: 56 %
Platelet Count: 167 10*3/uL (ref 150–400)
RBC: 3.73 MIL/uL — ABNORMAL LOW (ref 4.22–5.81)
RDW: 17.2 % — ABNORMAL HIGH (ref 11.5–15.5)
WBC Count: 6 10*3/uL (ref 4.0–10.5)
nRBC: 0 % (ref 0.0–0.2)

## 2022-04-01 MED ORDER — SODIUM CHLORIDE 0.9% FLUSH
10.0000 mL | Freq: Once | INTRAVENOUS | Status: AC
  Administered 2022-04-01: 10 mL

## 2022-04-01 MED ORDER — HEPARIN SOD (PORK) LOCK FLUSH 100 UNIT/ML IV SOLN
250.0000 [IU] | Freq: Once | INTRAVENOUS | Status: AC
  Administered 2022-04-01: 500 [IU]

## 2022-04-01 NOTE — Progress Notes (Signed)
[address]       [place] [address]             [phone]       HPI: Mr. Rinck had left VATS and placement of a Pleurx catheter on 02/07/2022 by Dr. Kipp Brood for malignant pleural effusion due to lung cancer.  He has had minimal drainage from the Pleurx over the last several weeks.  Most recent chest x-ray showed no significant residual effusion.  Is been followed closely by Dr. Kipp Brood who recommended removal of the Pleurx catheter.  Mr. Mccrone came to the office today for his procedure.   Current Outpatient Medications  Medication Sig Dispense Refill   acetaminophen (TYLENOL) 500 MG tablet Take 500 mg by mouth every 6 (six) hours as needed for mild pain or moderate pain.     ascorbic acid (VITAMIN C) 500 MG tablet Take 1,000 mg by mouth daily.     Blood Pressure Monitor KIT For daily blood pressure monitoring;  Arm Cuff 1 each 0   carvedilol (COREG) 3.125 MG tablet TAKE 1 TABLET BY MOUTH TWICE A DAY WITH MEALS (Patient taking differently: Take 3.125 mg by mouth daily. Take 1 tablet by mouth daily) 180 tablet 3   Cholecalciferol (VITAMIN D3) 25 MCG (1000 UT) CAPS Take 1 capsule by mouth daily.     escitalopram (LEXAPRO) 5 MG tablet Take 5 mg by mouth daily.     lidocaine-prilocaine (EMLA) cream Apply 1 Application topically as needed. 30 g 2   oxyCODONE (ROXICODONE) 5 MG immediate release tablet Take 0.5-1 tablets (2.5-5 mg total) by mouth every 4 (four) hours as needed. 30 tablet 0   prochlorperazine (COMPAZINE) 10 MG tablet Take 1 tablet (10 mg total) by mouth every 6 (six) hours as needed for nausea or vomiting. 30 tablet 0   No current facility-administered medications for this visit.    Physical Exam: Vital signs BP 106/65 Heart rate 81 Respirations 18 SPO2 95% General: 81 year old male in no acute distress.  He is thin. Pleurx catheter is dressed appropriately overlying the left chest.  Surrounding tissues appear healthy.  Diagnostic  Tests: None today  Impression / Plan: Left Pleurx catheter placed for recurrent pleural effusion and 81 year old gentleman with lung cancer.  The catheter has produced minimal drainage over the past few weeks.  It was connected to a vacuum bottle here in the office and had 0 output.  The left Pleurx catheter was prepped with Betadine and the surrounding tissues were anesthetized with 1% lidocaine local anesthesia.  Then, using gentle traction we attempted to deliver the cuff into the existing catheter exit site.  However, since the cuff was several centimeters away could not be pulled into the incision where it could be dissected.  For this reason, a new incision was made about 2 cm long directly over the cuff which was palpable through the skin.  Using sharp dissection, the cuff was exposed and dissected away from the surrounding soft tissues.  Then using gentle traction, the catheter was removed intact.  The incision was closed with a single figure-of-eight 3-0 nylon suture.  The tube exit site was left open.  The wounds were cleaned and a dry sterile dressing was applied.  We will ask Mr. Crosson to follow-up in 10 to 14 days for removal of the suture.  Antony Odea, PA-C [facility] [phone]

## 2022-04-01 NOTE — Patient Instructions (Signed)
You may remove the dressing in 24 hours and may shower over the site.  Keep the sites clean and dry. Follow-up in 10 to 14 days for suture removal

## 2022-04-04 MED FILL — Fosaprepitant Dimeglumine For IV Infusion 150 MG (Base Eq): INTRAVENOUS | Qty: 5 | Status: AC

## 2022-04-04 MED FILL — Dexamethasone Sodium Phosphate Inj 100 MG/10ML: INTRAMUSCULAR | Qty: 1 | Status: AC

## 2022-04-07 ENCOUNTER — Other Ambulatory Visit: Payer: Self-pay

## 2022-04-07 ENCOUNTER — Inpatient Hospital Stay: Payer: Medicare Other

## 2022-04-07 ENCOUNTER — Inpatient Hospital Stay (HOSPITAL_BASED_OUTPATIENT_CLINIC_OR_DEPARTMENT_OTHER): Payer: Medicare Other | Admitting: Internal Medicine

## 2022-04-07 VITALS — BP 99/58 | HR 64 | Temp 97.7°F | Wt 124.2 lb

## 2022-04-07 DIAGNOSIS — C3482 Malignant neoplasm of overlapping sites of left bronchus and lung: Secondary | ICD-10-CM

## 2022-04-07 DIAGNOSIS — C3490 Malignant neoplasm of unspecified part of unspecified bronchus or lung: Secondary | ICD-10-CM

## 2022-04-07 DIAGNOSIS — C782 Secondary malignant neoplasm of pleura: Secondary | ICD-10-CM | POA: Diagnosis not present

## 2022-04-07 DIAGNOSIS — Z5112 Encounter for antineoplastic immunotherapy: Secondary | ICD-10-CM

## 2022-04-07 DIAGNOSIS — Z5111 Encounter for antineoplastic chemotherapy: Secondary | ICD-10-CM

## 2022-04-07 DIAGNOSIS — J910 Malignant pleural effusion: Secondary | ICD-10-CM | POA: Diagnosis not present

## 2022-04-07 DIAGNOSIS — K5900 Constipation, unspecified: Secondary | ICD-10-CM | POA: Diagnosis not present

## 2022-04-07 DIAGNOSIS — R5383 Other fatigue: Secondary | ICD-10-CM

## 2022-04-07 LAB — CMP (CANCER CENTER ONLY)
ALT: 9 U/L (ref 0–44)
AST: 14 U/L — ABNORMAL LOW (ref 15–41)
Albumin: 3.4 g/dL — ABNORMAL LOW (ref 3.5–5.0)
Alkaline Phosphatase: 86 U/L (ref 38–126)
Anion gap: 3 — ABNORMAL LOW (ref 5–15)
BUN: 13 mg/dL (ref 8–23)
CO2: 31 mmol/L (ref 22–32)
Calcium: 9.2 mg/dL (ref 8.9–10.3)
Chloride: 106 mmol/L (ref 98–111)
Creatinine: 0.72 mg/dL (ref 0.61–1.24)
GFR, Estimated: >60 mL/min (ref >60)
Glucose, Bld: 128 mg/dL — ABNORMAL HIGH (ref 70–99)
Potassium: 4.2 mmol/L (ref 3.5–5.1)
Sodium: 140 mmol/L (ref 135–145)
Total Bilirubin: 0.3 mg/dL (ref 0.3–1.2)
Total Protein: 5.9 g/dL — ABNORMAL LOW (ref 6.5–8.1)

## 2022-04-07 LAB — CBC WITH DIFFERENTIAL (CANCER CENTER ONLY)
Abs Immature Granulocytes: 0.03 10*3/uL (ref 0.00–0.07)
Basophils Absolute: 0 10*3/uL (ref 0.0–0.1)
Basophils Relative: 1 %
Eosinophils Absolute: 0.1 10*3/uL (ref 0.0–0.5)
Eosinophils Relative: 1 %
HCT: 35.3 % — ABNORMAL LOW (ref 39.0–52.0)
Hemoglobin: 11.2 g/dL — ABNORMAL LOW (ref 13.0–17.0)
Immature Granulocytes: 1 %
Lymphocytes Relative: 42 %
Lymphs Abs: 1.8 10*3/uL (ref 0.7–4.0)
MCH: 28.4 pg (ref 26.0–34.0)
MCHC: 31.7 g/dL (ref 30.0–36.0)
MCV: 89.6 fL (ref 80.0–100.0)
Monocytes Absolute: 0.9 10*3/uL (ref 0.1–1.0)
Monocytes Relative: 21 %
Neutro Abs: 1.5 10*3/uL — ABNORMAL LOW (ref 1.7–7.7)
Neutrophils Relative %: 34 %
Platelet Count: 303 10*3/uL (ref 150–400)
RBC: 3.94 MIL/uL — ABNORMAL LOW (ref 4.22–5.81)
RDW: 17.6 % — ABNORMAL HIGH (ref 11.5–15.5)
WBC Count: 4.3 10*3/uL (ref 4.0–10.5)
nRBC: 0 % (ref 0.0–0.2)

## 2022-04-07 LAB — TSH: TSH: 2.486 u[IU]/mL (ref 0.350–4.500)

## 2022-04-07 MED ORDER — TRILACICLIB DIHYDROCHLORIDE INJECTION 300 MG
240.0000 mg/m2 | Freq: Once | INTRAVENOUS | Status: AC
  Administered 2022-04-07: 405 mg via INTRAVENOUS
  Filled 2022-04-07: qty 27

## 2022-04-07 MED ORDER — HEPARIN SOD (PORK) LOCK FLUSH 100 UNIT/ML IV SOLN
500.0000 [IU] | Freq: Once | INTRAVENOUS | Status: AC | PRN
  Administered 2022-04-07: 500 [IU]

## 2022-04-07 MED ORDER — SODIUM CHLORIDE 0.9 % IV SOLN
1500.0000 mg | Freq: Once | INTRAVENOUS | Status: AC
  Administered 2022-04-07: 1500 mg via INTRAVENOUS
  Filled 2022-04-07: qty 30

## 2022-04-07 MED ORDER — SODIUM CHLORIDE 0.9 % IV SOLN: 100.0000 mg/m2 | Freq: Once | INTRAVENOUS | Status: DC

## 2022-04-07 MED ORDER — SODIUM CHLORIDE 0.9 % IV SOLN
355.5000 mg | Freq: Once | INTRAVENOUS | Status: AC
  Administered 2022-04-07: 360 mg via INTRAVENOUS
  Filled 2022-04-07: qty 36

## 2022-04-07 MED ORDER — SODIUM CHLORIDE 0.9 % IV SOLN
150.0000 mg | Freq: Once | INTRAVENOUS | Status: AC
  Administered 2022-04-07: 150 mg via INTRAVENOUS
  Filled 2022-04-07: qty 150

## 2022-04-07 MED ORDER — SODIUM CHLORIDE 0.9 % IV SOLN: Freq: Once | INTRAVENOUS | Status: AC

## 2022-04-07 MED ORDER — SODIUM CHLORIDE 0.9% FLUSH
10.0000 mL | INTRAVENOUS | Status: DC | PRN
  Administered 2022-04-07: 10 mL

## 2022-04-07 MED ORDER — SODIUM CHLORIDE 0.9 % IV SOLN
10.0000 mg | Freq: Once | INTRAVENOUS | Status: AC
  Administered 2022-04-07: 10 mg via INTRAVENOUS
  Filled 2022-04-07: qty 10

## 2022-04-07 MED ORDER — SODIUM CHLORIDE 0.9 % IV SOLN
80.0000 mg/m2 | Freq: Once | INTRAVENOUS | Status: AC
  Administered 2022-04-07: 130 mg via INTRAVENOUS
  Filled 2022-04-07: qty 6.5

## 2022-04-07 MED ORDER — PALONOSETRON HCL INJECTION 0.25 MG/5ML
0.2500 mg | Freq: Once | INTRAVENOUS | Status: AC
  Administered 2022-04-07: 0.25 mg via INTRAVENOUS
  Filled 2022-04-07: qty 5

## 2022-04-07 MED FILL — Dexamethasone Sodium Phosphate Inj 100 MG/10ML: INTRAMUSCULAR | Qty: 1 | Status: AC

## 2022-04-07 NOTE — Progress Notes (Signed)
MD would like to continue Etoposide at dose basis 80 mg/m2.  Tx plan adjusted accordingly.  Kennith Center, Pharm.D., CPP 04/07/2022@11 :07 AM

## 2022-04-07 NOTE — Patient Instructions (Signed)
[facility]  Discharge Instructions: Thank you for choosing [facility] to provide your oncology and hematology care.   If you have a lab appointment with the [facility], please go directly to the [facility] and check in at the registration area.   Wear comfortable clothing and clothing appropriate for easy access to any Portacath or PICC line.   We strive to give you quality time with your provider. You may need to reschedule your appointment if you arrive late (15 or more minutes).  Arriving late affects you and other patients whose appointments are after yours.  Also, if you miss three or more appointments without notifying the office, you may be dismissed from the clinic at the provider's discretion.      For prescription refill requests, have your pharmacy contact our office and allow 72 hours for refills to be completed.    Today you received the following chemotherapy and/or immunotherapy agents : Imfinzi,Carboplatin, Etoposide      To help prevent nausea and vomiting after your treatment, we encourage you to take your nausea medication as directed.  BELOW ARE SYMPTOMS THAT SHOULD BE REPORTED IMMEDIATELY: *FEVER GREATER THAN 100.4 F (38 C) OR HIGHER *CHILLS OR SWEATING *NAUSEA AND VOMITING THAT IS NOT CONTROLLED WITH YOUR NAUSEA MEDICATION *UNUSUAL SHORTNESS OF BREATH *UNUSUAL BRUISING OR BLEEDING *URINARY PROBLEMS (pain or burning when urinating, or frequent urination) *BOWEL PROBLEMS (unusual diarrhea, constipation, pain near the anus) TENDERNESS IN MOUTH AND THROAT WITH OR WITHOUT PRESENCE OF ULCERS (sore throat, sores in mouth, or a toothache) UNUSUAL RASH, SWELLING OR PAIN  UNUSUAL VAGINAL DISCHARGE OR ITCHING   Items with * indicate a potential emergency and should be followed up as soon as possible or go to the Emergency Department if any problems should occur.  Please show the CHEMOTHERAPY ALERT CARD or IMMUNOTHERAPY  ALERT CARD at check-in to the Emergency Department and triage nurse.  Should you have questions after your visit or need to cancel or reschedule your appointment, please contact [facility]  Dept: [phone]  and follow the prompts.  Office hours are 8:00 a.m. to 4:30 p.m. Monday - Friday. Please note that voicemails left after 4:00 p.m. may not be returned until the following business day.  We are closed weekends and major holidays. You have access to a nurse at all times for urgent questions. Please call the main number to the clinic Dept: [phone] and follow the prompts.   For any non-urgent questions, you may also contact your provider using MyChart. We now offer e-Visits for anyone 82 and older to request care online for non-urgent symptoms. For details visit [email or web address].   Also download the MyChart app! Go to the app store, search "MyChart", open the app, select [facility], and log in with your MyChart username and password.  Masks are optional in the cancer centers. If you would like for your care team to wear a mask while they are taking care of you, please let them know. You may have one support person who is at least 81 years old accompany you for your appointments.

## 2022-04-07 NOTE — Progress Notes (Signed)
[facility] [phone]   [phone]  OFFICE PROGRESS NOTE  Shon Baton, MD [place] [address]  DIAGNOSIS: Extensive stage (T3, N2, M1 a) small cell lung cancer presented with obstructive left upper lobe lung mass in addition to mediastinal lymphadenopathy and malignant left pleural effusion as well as pleural-based metastasis as well as suspicious liver metastasis diagnosed in June 2023.  PRIOR THERAPY: None  CURRENT THERAPY: Palliative systemic chemotherapy with carboplatin initiated for AUC of 4 on day 1 and etoposide 80 Mg/M2 on days 1, 2 and 3 with Cosela before the chemotherapy and Imfinzi 1500 Mg IV on day 1 every 3 weeks.  Status post 3 cycles.    INTERVAL HISTORY: Pedro Pope. 81 y.o. male returns to the clinic today for follow-up visit accompanied by his son.  The patient is feeling fine today with no concerning complaints except for intermittent constipation and he is using stool softener and MiraLAX.  He denied having any current chest pain but has mild shortness of breath with exertion with no cough or hemoptysis.  He has no nausea, vomiting, diarrhea or constipation.  He has no headache or visual changes.  He has no recent weight loss or night sweats.  He is here today for evaluation before starting cycle #4 of his treatment.   MEDICAL HISTORY: Past Medical History:  Diagnosis Date   Anosmia    CAD (coronary artery disease), native coronary artery    a. 10/29/2013 antlat STEMI s/p DES to pLAD; residual 80 % proximal and 60% mid LCx diease   Chronic headaches    Dressler's syndrome (HCC)    a. placed on colchicine    ED (erectile dysfunction)    Elevated TSH    Fatty liver    Gout    Ischemic cardiomyopathy    a. 2D ECHO 11/04/14 w/ EF 40-45% with LV WMA, G2DD, mild MR, mild LA dilation, mod TR.   PAF (paroxysmal atrial fibrillation) ([facility])    a. Dx on 11/03/14 admission. Not placed on longterm AC due to  DAPT w/ receent DES. Will plan for outpt heart monitor    Pre-diabetes    a. HgA1c 6.1 in 10/2014   Prostate cancer [facility])    s/p prostatectomy and XRT   Rheumatic fever 1947    ALLERGIES:  is allergic to penicillins, corn syrup [glucose], and aspirin adult low [aspirin].  MEDICATIONS:  Current Outpatient Medications  Medication Sig Dispense Refill   acetaminophen (TYLENOL) 500 MG tablet Take 500 mg by mouth every 6 (six) hours as needed for mild pain or moderate pain.     ascorbic acid (VITAMIN C) 500 MG tablet Take 1,000 mg by mouth daily.     Blood Pressure Monitor KIT For daily blood pressure monitoring;  Arm Cuff 1 each 0   carvedilol (COREG) 3.125 MG tablet TAKE 1 TABLET BY MOUTH TWICE A DAY WITH MEALS (Patient taking differently: Take 3.125 mg by mouth daily. Take 1 tablet by mouth daily) 180 tablet 3   Cholecalciferol (VITAMIN D3) 25 MCG (1000 UT) CAPS Take 1 capsule by mouth daily.     escitalopram (LEXAPRO) 5 MG tablet Take 5 mg by mouth daily.     lidocaine-prilocaine (EMLA) cream Apply 1 Application topically as needed. 30 g 2   oxyCODONE (ROXICODONE) 5 MG immediate release tablet Take 0.5-1 tablets (2.5-5 mg total) by mouth every 4 (four) hours as needed. 30 tablet 0   prochlorperazine (COMPAZINE)  10 MG tablet Take 1 tablet (10 mg total) by mouth every 6 (six) hours as needed for nausea or vomiting. 30 tablet 0   No current facility-administered medications for this visit.    SURGICAL HISTORY:  Past Surgical History:  Procedure Laterality Date   CORONARY ANGIOPLASTY WITH STENT PLACEMENT  10/30/14   STEMI s/p DES to proximal LAD; residual 80 % proximal and 60% mid LCx diease   IR IMAGING GUIDED PORT INSERTION  03/12/2022   LEFT HEART CATHETERIZATION WITH CORONARY ANGIOGRAM N/A 10/30/2014   Procedure: LEFT HEART CATHETERIZATION WITH CORONARY ANGIOGRAM;  Surgeon: Lorretta Harp, MD;  Location: [facility] CATH LAB;  Service: Cardiovascular;  Laterality: N/A;   PERCUTANEOUS CORONARY  STENT INTERVENTION (PCI-S)  10/30/2014   Procedure: PERCUTANEOUS CORONARY STENT INTERVENTION (PCI-S);  Surgeon: Lorretta Harp, MD;  Location: [facility] CATH LAB;  Service: Cardiovascular;;   PROSTATECTOMY     THORACENTESIS Left 12/31/2021   Procedure: THORACENTESIS;  Surgeon: Candee Furbish, MD;  Location: [facility] ENDOSCOPY;  Service: Pulmonary;  Laterality: Left;   TONSILLECTOMY     VIDEO ASSISTED THORACOSCOPY (VATS)/DECORTICATION Left 01/21/2022   Procedure: VIDEO ASSISTED THORACOSCOPY (VATS)/DECORTICATION;  Surgeon: Lajuana Matte, MD;  Location: [facility] OR;  Service: Thoracic;  Laterality: Left;    REVIEW OF SYSTEMS:  A comprehensive review of systems was negative except for: Constitutional: positive for fatigue Respiratory: positive for dyspnea on exertion Musculoskeletal: positive for muscle weakness   PHYSICAL EXAMINATION: General appearance: alert, cooperative, fatigued, and no distress Head: Normocephalic, without obvious abnormality, atraumatic Neck: no adenopathy, no JVD, supple, symmetrical, trachea midline, and thyroid not enlarged, symmetric, no tenderness/mass/nodules Lymph nodes: Cervical, supraclavicular, and axillary nodes normal. Resp: clear to auscultation bilaterally Back: symmetric, no curvature. ROM normal. No CVA tenderness. Cardio: regular rate and rhythm, S1, S2 normal, no murmur, click, rub or gallop GI: soft, non-tender; bowel sounds normal; no masses,  no organomegaly Extremities: extremities normal, atraumatic, no cyanosis or edema  ECOG PERFORMANCE STATUS: 1 - Symptomatic but completely ambulatory  Blood pressure (!) 99/58, pulse 64, temperature 97.7 F (36.5 C), temperature source Tympanic, weight 124 lb 3.2 oz (56.3 kg), SpO2 95 %.  LABORATORY DATA: Lab Results  Component Value Date   WBC 6.0 04/01/2022   HGB 10.7 (L) 04/01/2022   HCT 32.9 (L) 04/01/2022   MCV 88.2 04/01/2022   PLT 167 04/01/2022      Chemistry      Component Value Date/Time   NA 140  04/01/2022 1047   K 3.6 04/01/2022 1047   CL 108 04/01/2022 1047   CO2 26 04/01/2022 1047   BUN 17 04/01/2022 1047   CREATININE 0.88 04/01/2022 1047      Component Value Date/Time   CALCIUM 8.9 04/01/2022 1047   ALKPHOS 81 04/01/2022 1047   AST 18 04/01/2022 1047   ALT 13 04/01/2022 1047   BILITOT 0.4 04/01/2022 1047       RADIOGRAPHIC STUDIES: DG Chest 2 View  Result Date: 03/14/2022 CLINICAL DATA:  Pleural effusion. PleurX in place. History of small cell lung cancer. Overlapping sites of left lung. Status post VATS/decortication surgery 01/21/2022 EXAM: CHEST - 2 VIEW COMPARISON:  Chest two views 02/07/2022 and CT chest 03/13/2022 FINDINGS: Right chest wall porta catheter with the tip overlying the superior vena cava/right atrial junction. Posterior left sided chest tube with tip again overlying the superior aspect of the left hemithorax, similar to 02/07/2022 radiographs. Only very small left pleural effusion is unchanged from recent 03/13/2022 CT and improved  from the homogeneous opacification seen throughout the superior 50% of the left hemithorax on 02/07/2022. There is persistent soft tissue density at the lateral mid to upper lung pleura unchanged from recent CT. Chronic centrilobular emphysematous changes with peripheral basilar predominant subpleural interstitial lung disease/scarring. No pneumothorax. Cardiac silhouette and mediastinal contours are within normal limits with mild calcification within aortic arch. Mild-to-moderate multilevel degenerative disc changes throughout the thoracic spine. IMPRESSION: 1. Unchanged position of left-sided posterior PleurX drain. 2. Improved aeration of the left upper hemithorax compared to 02/07/2022. The current appearance is likely not significantly changed from recent 03/13/2022 CT yesterday. Minimal left basilar pleural effusion. Peripheral left upper lobe soft tissue density appearing to represent volume loss and atelectasis on recent CT.  Electronically Signed   By: Yvonne Kendall M.D.   On: 03/14/2022 11:32   CT Chest W Contrast  Result Date: 03/13/2022 CLINICAL DATA:  Small cell lung cancer restaging * Tracking Code: BO * EXAM: CT CHEST, ABDOMEN, AND PELVIS WITH CONTRAST TECHNIQUE: Multidetector CT imaging of the chest, abdomen and pelvis was performed following the standard protocol during bolus administration of intravenous contrast. RADIATION DOSE REDUCTION: This exam was performed according to the departmental dose-optimization program which includes automated exposure control, adjustment of the mA and/or kV according to patient size and/or use of iterative reconstruction technique. CONTRAST:  151m OMNIPAQUE IOHEXOL 300 MG/ML  SOLN COMPARISON:  Multiple exams, including PET-CT 02/10/2022 and CT scan 02/07/2022 and 01/20/2022 FINDINGS: CT CHEST FINDINGS Cardiovascular: Right Port-A-Cath tip: Cavoatrial junction. Coronary, aortic arch, and branch vessel atherosclerotic vascular disease. Apical thinning in the left ventricle. Chronic anterior mural thrombus in the lower descending thoracic aorta anteriorly on image 48 series 503. Mediastinum/Nodes: AP window lymph node 0.9 cm in short axis on image 20 series 503, previously 1.2 cm. Subcarinal node 1.0 cm in short axis on image 24 series 503, formerly 1.1 cm. Right hilar lymph node 0.8 cm in short axis on image 23 series five hundred three. Lungs/Pleura: A left pleural drainage catheter remains in place. Small left pleural effusion along the lung base with thick enhancing margins and some nodularity along the pleural surfaces, pleural tumor not excluded, empyema not excluded. The left pleural effusion is reduced in size compared to 02/10/2022. Biapical pleuroparenchymal scarring. Centrilobular emphysema. Airway thickening is present, suggesting bronchitis or reactive airways disease. Coarse peripheral interstitial accentuation bilaterally. Substantially improved airspace opacity and  interstitial opacity in the left upper lobe. There is some residual consolidation and/or atelectasis peripherally in the left upper lobe, region of involvement about 4.6 by 2.5 cm on image 42 series 505, significantly improved. There is some passive atelectasis in the left lower lobe. Musculoskeletal: Thoracic spondylosis. CT ABDOMEN PELVIS FINDINGS Hepatobiliary: The hypodense lesion previously shown in segment 7 of the liver on 01/17/2022 not well seen on the current exam, suggesting isoenhancement or resolution. This lesion was not hypermetabolic on 080/99/8338 Contracted gallbladder. No substantial biliary dilatation. No new liver lesion identified. Pancreas: Unremarkable Spleen: Unremarkable Adrenals/Urinary Tract: Both adrenal glands appear normal. No renal abnormality or significant abnormality in the urinary bladder. There is contrast medium in the renal collecting systems on initial images, which reduces sensitivity for nonobstructive renal calculi, but the triangular 0.6 cm hyperdensity in the right kidney lower pole on image 63 of series 503 is compatible with calculus and was shown previously. Stomach/Bowel: Unremarkable Vascular/Lymphatic: Atherosclerosis is present, including aortoiliac atherosclerotic disease. A lymph node posterior to the stomach on image 55 series 503 measures 0.5 cm in diameter,  formerly 1.1 cm on 01/17/2022. Retrocrural adenopathy shown on 01/17/2022 has resolved. Substantial chronic mural thrombus and focal chronic dissection of the abdominal aorta noted for example on image 68 series 506, not changed from prior exam. The chronic focal dissection flap has associated calcification. Stable short segment occlusion of 1.9 cm of the left common iliac artery with subsequent reconstitution. No pelvic adenopathy. Reproductive: Prostatectomy. Other: No supplemental non-categorized findings. Musculoskeletal: Bilateral foraminal impingement at L5-S1 due to facet and intervertebral spurring.  Moderate degenerative hip arthropathy bilaterally. IMPRESSION: 1. Reduced left pleural effusion compared to PET-CT of 02/10/2022, but with continued nodularity and irregularity along the pleural surface potentially from pleural tumor or empyema. Left pleural drainage catheter remains in place. 2. Substantially improved left upper lobe airspace opacity compared to 02/10/2022. There is some moderate peripheral volume loss and/or mild residual consolidation in the left upper lobe. 3. Compared to 01/17/2022, there has been interval resolution of the retroperitoneal adenopathy. Also the hypodense lesion in segment 7 of the liver is not visible on today's exam. 4. Other imaging findings of potential clinical significance: Aortic Atherosclerosis (ICD10-I70.0). Coronary and systemic atherosclerosis. Chronic focal dissection in the abdominal aorta. Emphysema (ICD10-J43.9). Airway thickening is present, suggesting bronchitis or reactive airways disease. Coarse peripheral interstitial accentuation bilaterally. Stable short-occlusion of the left common iliac artery with subsequent reconstitution. Bilateral foraminal impingement at L5-S1. Moderate degenerative hip arthropathy bilaterally. Nonobstructive right nephrolithiasis. Electronically Signed   By: Van Clines M.D.   On: 03/13/2022 12:41   CT Abdomen Pelvis W Contrast  Result Date: 03/13/2022 CLINICAL DATA:  Small cell lung cancer restaging * Tracking Code: BO * EXAM: CT CHEST, ABDOMEN, AND PELVIS WITH CONTRAST TECHNIQUE: Multidetector CT imaging of the chest, abdomen and pelvis was performed following the standard protocol during bolus administration of intravenous contrast. RADIATION DOSE REDUCTION: This exam was performed according to the departmental dose-optimization program which includes automated exposure control, adjustment of the mA and/or kV according to patient size and/or use of iterative reconstruction technique. CONTRAST:  161m OMNIPAQUE IOHEXOL  300 MG/ML  SOLN COMPARISON:  Multiple exams, including PET-CT 02/10/2022 and CT scan 02/07/2022 and 01/20/2022 FINDINGS: CT CHEST FINDINGS Cardiovascular: Right Port-A-Cath tip: Cavoatrial junction. Coronary, aortic arch, and branch vessel atherosclerotic vascular disease. Apical thinning in the left ventricle. Chronic anterior mural thrombus in the lower descending thoracic aorta anteriorly on image 48 series 503. Mediastinum/Nodes: AP window lymph node 0.9 cm in short axis on image 20 series 503, previously 1.2 cm. Subcarinal node 1.0 cm in short axis on image 24 series 503, formerly 1.1 cm. Right hilar lymph node 0.8 cm in short axis on image 23 series five hundred three. Lungs/Pleura: A left pleural drainage catheter remains in place. Small left pleural effusion along the lung base with thick enhancing margins and some nodularity along the pleural surfaces, pleural tumor not excluded, empyema not excluded. The left pleural effusion is reduced in size compared to 02/10/2022. Biapical pleuroparenchymal scarring. Centrilobular emphysema. Airway thickening is present, suggesting bronchitis or reactive airways disease. Coarse peripheral interstitial accentuation bilaterally. Substantially improved airspace opacity and interstitial opacity in the left upper lobe. There is some residual consolidation and/or atelectasis peripherally in the left upper lobe, region of involvement about 4.6 by 2.5 cm on image 42 series 505, significantly improved. There is some passive atelectasis in the left lower lobe. Musculoskeletal: Thoracic spondylosis. CT ABDOMEN PELVIS FINDINGS Hepatobiliary: The hypodense lesion previously shown in segment 7 of the liver on 01/17/2022 not well seen on the current  exam, suggesting isoenhancement or resolution. This lesion was not hypermetabolic on 97/98/9211. Contracted gallbladder. No substantial biliary dilatation. No new liver lesion identified. Pancreas: Unremarkable Spleen: Unremarkable  Adrenals/Urinary Tract: Both adrenal glands appear normal. No renal abnormality or significant abnormality in the urinary bladder. There is contrast medium in the renal collecting systems on initial images, which reduces sensitivity for nonobstructive renal calculi, but the triangular 0.6 cm hyperdensity in the right kidney lower pole on image 63 of series 503 is compatible with calculus and was shown previously. Stomach/Bowel: Unremarkable Vascular/Lymphatic: Atherosclerosis is present, including aortoiliac atherosclerotic disease. A lymph node posterior to the stomach on image 55 series 503 measures 0.5 cm in diameter, formerly 1.1 cm on 01/17/2022. Retrocrural adenopathy shown on 01/17/2022 has resolved. Substantial chronic mural thrombus and focal chronic dissection of the abdominal aorta noted for example on image 68 series 506, not changed from prior exam. The chronic focal dissection flap has associated calcification. Stable short segment occlusion of 1.9 cm of the left common iliac artery with subsequent reconstitution. No pelvic adenopathy. Reproductive: Prostatectomy. Other: No supplemental non-categorized findings. Musculoskeletal: Bilateral foraminal impingement at L5-S1 due to facet and intervertebral spurring. Moderate degenerative hip arthropathy bilaterally. IMPRESSION: 1. Reduced left pleural effusion compared to PET-CT of 02/10/2022, but with continued nodularity and irregularity along the pleural surface potentially from pleural tumor or empyema. Left pleural drainage catheter remains in place. 2. Substantially improved left upper lobe airspace opacity compared to 02/10/2022. There is some moderate peripheral volume loss and/or mild residual consolidation in the left upper lobe. 3. Compared to 01/17/2022, there has been interval resolution of the retroperitoneal adenopathy. Also the hypodense lesion in segment 7 of the liver is not visible on today's exam. 4. Other imaging findings of potential  clinical significance: Aortic Atherosclerosis (ICD10-I70.0). Coronary and systemic atherosclerosis. Chronic focal dissection in the abdominal aorta. Emphysema (ICD10-J43.9). Airway thickening is present, suggesting bronchitis or reactive airways disease. Coarse peripheral interstitial accentuation bilaterally. Stable short-occlusion of the left common iliac artery with subsequent reconstitution. Bilateral foraminal impingement at L5-S1. Moderate degenerative hip arthropathy bilaterally. Nonobstructive right nephrolithiasis. Electronically Signed   By: Van Clines M.D.   On: 03/13/2022 12:41   IR IMAGING GUIDED PORT INSERTION  Result Date: 03/12/2022 CLINICAL DATA:  Small cell lung carcinoma, needs durable venous access for planned treatment regimen EXAM: TUNNELED PORT CATHETER PLACEMENT WITH ULTRASOUND AND FLUOROSCOPIC GUIDANCE FLUOROSCOPY: Radiation Exposure Index (as provided by the fluoroscopic device): Less than 0.1 mGy air Kerma ANESTHESIA/SEDATION: Intravenous Fentanyl 35mg and Versed 159mwere administered as conscious sedation during continuous monitoring of the patient's level of consciousness and physiological / cardiorespiratory status by the radiology RN, with a total moderate sedation time of 17 minutes. TECHNIQUE: The procedure, risks, benefits, and alternatives were explained to the patient. Questions regarding the procedure were encouraged and answered. The patient understands and consents to the procedure. Patency of the right IJ vein was confirmed with ultrasound with image documentation. An appropriate skin site was determined. Skin site was marked. Region was prepped using maximum barrier technique including cap and mask, sterile gown, sterile gloves, large sterile sheet, and Chlorhexidine as cutaneous antisepsis. The region was infiltrated locally with 1% lidocaine. Under real-time ultrasound guidance, the right IJ vein was accessed with a 21 gauge micropuncture needle; the needle tip  within the vein was confirmed with ultrasound image documentation. Needle was exchanged over a 018 guidewire for transitional dilator, and vascular measurement was performed. A small incision was made on the right anterior chest  wall and a subcutaneous pocket fashioned. The power-injectable port was positioned and its catheter tunneled to the right IJ dermatotomy site. The transitional dilator was exchanged over an Amplatz wire for a peel-away sheath, through which the port catheter, which had been trimmed to the appropriate length, was advanced and positioned under fluoroscopy with its tip at the cavoatrial junction. Spot chest radiograph confirms good catheter position and no pneumothorax. The port was flushed per protocol. The pocket was closed with deep interrupted and subcuticular continuous 3-0 Monocryl sutures. The incisions were covered with Dermabond then covered with a sterile dressing. The patient tolerated the procedure well. COMPLICATIONS: COMPLICATIONS None immediate IMPRESSION: Technically successful right IJ power-injectable port catheter placement. Ready for routine use. Electronically Signed   By: Lucrezia Europe M.D.   On: 03/12/2022 15:42    ASSESSMENT AND PLAN: This is a very pleasant 81 years old white male with Extensive stage (T3, N2, M1 a) small cell lung cancer presented with obstructive left upper lobe lung mass in addition to mediastinal lymphadenopathy and malignant left pleural effusion as well as pleural-based metastasis and suspicious liver metastasis diagnosed in June 2023. The patient is currently undergoing systemic chemotherapy with carboplatin for AUC of 4 on day 1, etoposide 80 Mg/M2 on days 1, 2 and 3 with Imfinzi 240 Mg/M2 on the days before chemotherapy and Imfinzi 1500 Mg IV on day 1 every 3 weeks.  Status post 3 cycles.   The patient has been tolerating this treatment well with no concerning adverse effects. I recommended for him to proceed with cycle #4 today as  planned. I will see him back for follow-up visit in 3 weeks for evaluation with repeat CT scan of the chest, abdomen and pelvis for restaging of his disease. For the constipation, he will continue with the stool softener and MiraLAX on as-needed basis. The patient was advised to call immediately if he has any other concerning symptoms in the interval. The patient voices understanding of current disease status and treatment options and is in agreement with the current care plan.  All questions were answered. The patient knows to call the clinic with any problems, questions or concerns. We can certainly see the patient much sooner if necessary.  The total time spent in the appointment was 20 minutes.  Disclaimer: This note was dictated with voice recognition software. Similar sounding words can inadvertently be transcribed and may not be corrected upon review.

## 2022-04-08 ENCOUNTER — Ambulatory Visit: Payer: Medicare Other

## 2022-04-08 ENCOUNTER — Inpatient Hospital Stay: Payer: Medicare Other

## 2022-04-08 VITALS — BP 107/51 | HR 69 | Temp 98.7°F | Resp 18

## 2022-04-08 DIAGNOSIS — K5900 Constipation, unspecified: Secondary | ICD-10-CM | POA: Diagnosis not present

## 2022-04-08 DIAGNOSIS — C3482 Malignant neoplasm of overlapping sites of left bronchus and lung: Secondary | ICD-10-CM

## 2022-04-08 DIAGNOSIS — Z5111 Encounter for antineoplastic chemotherapy: Secondary | ICD-10-CM | POA: Diagnosis not present

## 2022-04-08 DIAGNOSIS — Z5112 Encounter for antineoplastic immunotherapy: Secondary | ICD-10-CM | POA: Diagnosis not present

## 2022-04-08 DIAGNOSIS — J910 Malignant pleural effusion: Secondary | ICD-10-CM | POA: Diagnosis not present

## 2022-04-08 DIAGNOSIS — C782 Secondary malignant neoplasm of pleura: Secondary | ICD-10-CM | POA: Diagnosis not present

## 2022-04-08 LAB — T4: T4, Total: 4.7 ug/dL (ref 4.5–12.0)

## 2022-04-08 MED ORDER — TRILACICLIB DIHYDROCHLORIDE INJECTION 300 MG
240.0000 mg/m2 | Freq: Once | INTRAVENOUS | Status: AC
  Administered 2022-04-08: 405 mg via INTRAVENOUS
  Filled 2022-04-08: qty 27

## 2022-04-08 MED ORDER — SODIUM CHLORIDE 0.9 % IV SOLN
10.0000 mg | Freq: Once | INTRAVENOUS | Status: AC
  Administered 2022-04-08: 10 mg via INTRAVENOUS
  Filled 2022-04-08: qty 10

## 2022-04-08 MED ORDER — SODIUM CHLORIDE 0.9% FLUSH
10.0000 mL | INTRAVENOUS | Status: DC | PRN
  Administered 2022-04-08: 10 mL

## 2022-04-08 MED ORDER — HEPARIN SOD (PORK) LOCK FLUSH 100 UNIT/ML IV SOLN
500.0000 [IU] | Freq: Once | INTRAVENOUS | Status: AC | PRN
  Administered 2022-04-08: 500 [IU]

## 2022-04-08 MED ORDER — SODIUM CHLORIDE 0.9 % IV SOLN: Freq: Once | INTRAVENOUS | Status: AC

## 2022-04-08 MED ORDER — SODIUM CHLORIDE 0.9 % IV SOLN
80.0000 mg/m2 | Freq: Once | INTRAVENOUS | Status: AC
  Administered 2022-04-08: 130 mg via INTRAVENOUS
  Filled 2022-04-08: qty 6.5

## 2022-04-08 MED ORDER — TRILACICLIB DIHYDROCHLORIDE INJECTION 300 MG
240.0000 mg/m2 | Freq: Once | INTRAVENOUS | Status: DC
  Filled 2022-04-08: qty 27

## 2022-04-08 MED FILL — Dexamethasone Sodium Phosphate Inj 100 MG/10ML: INTRAMUSCULAR | Qty: 1 | Status: AC

## 2022-04-08 NOTE — Patient Instructions (Signed)
[facility] ONCOLOGY  Discharge Instructions: Thank you for choosing [facility] to provide your oncology and hematology care.   If you have a lab appointment with the [facility], please go directly to the [facility] and check in at the registration area.   Wear comfortable clothing and clothing appropriate for easy access to any Portacath or PICC line.   We strive to give you quality time with your provider. You may need to reschedule your appointment if you arrive late (15 or more minutes).  Arriving late affects you and other patients whose appointments are after yours.  Also, if you miss three or more appointments without notifying the office, you may be dismissed from the clinic at the provider's discretion.      For prescription refill requests, have your pharmacy contact our office and allow 72 hours for refills to be completed.    Today you received the following chemotherapy and/or immunotherapy agents : Etoposide, Cosela      To help prevent nausea and vomiting after your treatment, we encourage you to take your nausea medication as directed.  BELOW ARE SYMPTOMS THAT SHOULD BE REPORTED IMMEDIATELY: *FEVER GREATER THAN 100.4 F (38 C) OR HIGHER *CHILLS OR SWEATING *NAUSEA AND VOMITING THAT IS NOT CONTROLLED WITH YOUR NAUSEA MEDICATION *UNUSUAL SHORTNESS OF BREATH *UNUSUAL BRUISING OR BLEEDING *URINARY PROBLEMS (pain or burning when urinating, or frequent urination) *BOWEL PROBLEMS (unusual diarrhea, constipation, pain near the anus) TENDERNESS IN MOUTH AND THROAT WITH OR WITHOUT PRESENCE OF ULCERS (sore throat, sores in mouth, or a toothache) UNUSUAL RASH, SWELLING OR PAIN  UNUSUAL VAGINAL DISCHARGE OR ITCHING   Items with * indicate a potential emergency and should be followed up as soon as possible or go to the Emergency Department if any problems should occur.  Please show the CHEMOTHERAPY ALERT CARD or IMMUNOTHERAPY ALERT CARD at  check-in to the Emergency Department and triage nurse.  Should you have questions after your visit or need to cancel or reschedule your appointment, please contact [facility]  Dept: [phone]  and follow the prompts.  Office hours are 8:00 a.m. to 4:30 p.m. Monday - Friday. Please note that voicemails left after 4:00 p.m. may not be returned until the following business day.  We are closed weekends and major holidays. You have access to a nurse at all times for urgent questions. Please call the main number to the clinic Dept: [phone] and follow the prompts.   For any non-urgent questions, you may also contact your provider using MyChart. We now offer e-Visits for anyone 44 and older to request care online for non-urgent symptoms. For details visit [email or web address].   Also download the MyChart app! Go to the app store, search "MyChart", open the app, select [facility], and log in with your MyChart username and password.  Masks are optional in the cancer centers. If you would like for your care team to wear a mask while they are taking care of you, please let them know. You may have one support person who is at least 81 years old accompany you for your appointments.

## 2022-04-09 ENCOUNTER — Other Ambulatory Visit: Payer: Self-pay

## 2022-04-09 ENCOUNTER — Inpatient Hospital Stay: Payer: Medicare Other

## 2022-04-09 ENCOUNTER — Ambulatory Visit: Payer: Medicare Other

## 2022-04-09 VITALS — BP 113/48 | HR 65 | Temp 97.5°F | Resp 17

## 2022-04-09 DIAGNOSIS — C782 Secondary malignant neoplasm of pleura: Secondary | ICD-10-CM | POA: Diagnosis not present

## 2022-04-09 DIAGNOSIS — J910 Malignant pleural effusion: Secondary | ICD-10-CM | POA: Diagnosis not present

## 2022-04-09 DIAGNOSIS — Z5111 Encounter for antineoplastic chemotherapy: Secondary | ICD-10-CM | POA: Diagnosis not present

## 2022-04-09 DIAGNOSIS — C3482 Malignant neoplasm of overlapping sites of left bronchus and lung: Secondary | ICD-10-CM

## 2022-04-09 DIAGNOSIS — K5900 Constipation, unspecified: Secondary | ICD-10-CM | POA: Diagnosis not present

## 2022-04-09 DIAGNOSIS — Z5112 Encounter for antineoplastic immunotherapy: Secondary | ICD-10-CM | POA: Diagnosis not present

## 2022-04-09 MED ORDER — SODIUM CHLORIDE 0.9% FLUSH
10.0000 mL | INTRAVENOUS | Status: DC | PRN
  Administered 2022-04-09: 10 mL

## 2022-04-09 MED ORDER — TRILACICLIB DIHYDROCHLORIDE INJECTION 300 MG
240.0000 mg/m2 | Freq: Once | INTRAVENOUS | Status: AC
  Administered 2022-04-09: 405 mg via INTRAVENOUS
  Filled 2022-04-09: qty 27

## 2022-04-09 MED ORDER — SODIUM CHLORIDE 0.9 % IV SOLN
10.0000 mg | Freq: Once | INTRAVENOUS | Status: AC
  Administered 2022-04-09: 10 mg via INTRAVENOUS
  Filled 2022-04-09: qty 10

## 2022-04-09 MED ORDER — HEPARIN SOD (PORK) LOCK FLUSH 100 UNIT/ML IV SOLN
500.0000 [IU] | Freq: Once | INTRAVENOUS | Status: AC | PRN
  Administered 2022-04-09: 500 [IU]

## 2022-04-09 MED ORDER — SODIUM CHLORIDE 0.9 % IV SOLN
80.0000 mg/m2 | Freq: Once | INTRAVENOUS | Status: AC
  Administered 2022-04-09: 130 mg via INTRAVENOUS
  Filled 2022-04-09: qty 6.5

## 2022-04-09 MED ORDER — SODIUM CHLORIDE 0.9 % IV SOLN: Freq: Once | INTRAVENOUS | Status: AC

## 2022-04-14 ENCOUNTER — Ambulatory Visit (INDEPENDENT_AMBULATORY_CARE_PROVIDER_SITE_OTHER): Payer: Self-pay | Admitting: *Deleted

## 2022-04-14 DIAGNOSIS — Z4802 Encounter for removal of sutures: Secondary | ICD-10-CM

## 2022-04-14 NOTE — Progress Notes (Signed)
Patient arrived for nurse visit to remove sutures post-removal of pleurx catheter 8/15.  One suture removed with no signs or symptoms of infection noted.  Patient tolerated suture removal well.  Patient and family instructed to keep the incision site clean and dry. Patient and family acknowledged instructions given.  All questions answered.

## 2022-04-15 ENCOUNTER — Other Ambulatory Visit: Payer: Self-pay

## 2022-04-15 ENCOUNTER — Inpatient Hospital Stay: Payer: Medicare Other

## 2022-04-15 DIAGNOSIS — C3490 Malignant neoplasm of unspecified part of unspecified bronchus or lung: Secondary | ICD-10-CM

## 2022-04-15 DIAGNOSIS — Z95828 Presence of other vascular implants and grafts: Secondary | ICD-10-CM

## 2022-04-15 DIAGNOSIS — C3482 Malignant neoplasm of overlapping sites of left bronchus and lung: Secondary | ICD-10-CM | POA: Diagnosis not present

## 2022-04-15 DIAGNOSIS — C782 Secondary malignant neoplasm of pleura: Secondary | ICD-10-CM | POA: Diagnosis not present

## 2022-04-15 DIAGNOSIS — Z5112 Encounter for antineoplastic immunotherapy: Secondary | ICD-10-CM | POA: Diagnosis not present

## 2022-04-15 DIAGNOSIS — Z5111 Encounter for antineoplastic chemotherapy: Secondary | ICD-10-CM | POA: Diagnosis not present

## 2022-04-15 DIAGNOSIS — K5900 Constipation, unspecified: Secondary | ICD-10-CM | POA: Diagnosis not present

## 2022-04-15 DIAGNOSIS — J910 Malignant pleural effusion: Secondary | ICD-10-CM | POA: Diagnosis not present

## 2022-04-15 LAB — CBC WITH DIFFERENTIAL (CANCER CENTER ONLY)
Abs Immature Granulocytes: 0.07 10*3/uL (ref 0.00–0.07)
Basophils Absolute: 0 10*3/uL (ref 0.0–0.1)
Basophils Relative: 0 %
Eosinophils Absolute: 0.1 10*3/uL (ref 0.0–0.5)
Eosinophils Relative: 2 %
HCT: 31.9 % — ABNORMAL LOW (ref 39.0–52.0)
Hemoglobin: 10.4 g/dL — ABNORMAL LOW (ref 13.0–17.0)
Immature Granulocytes: 1 %
Lymphocytes Relative: 39 %
Lymphs Abs: 2.1 10*3/uL (ref 0.7–4.0)
MCH: 29.2 pg (ref 26.0–34.0)
MCHC: 32.6 g/dL (ref 30.0–36.0)
MCV: 89.6 fL (ref 80.0–100.0)
Monocytes Absolute: 0.2 10*3/uL (ref 0.1–1.0)
Monocytes Relative: 4 %
Neutro Abs: 2.9 10*3/uL (ref 1.7–7.7)
Neutrophils Relative %: 54 %
Platelet Count: 226 10*3/uL (ref 150–400)
RBC: 3.56 MIL/uL — ABNORMAL LOW (ref 4.22–5.81)
RDW: 17.2 % — ABNORMAL HIGH (ref 11.5–15.5)
WBC Count: 5.4 10*3/uL (ref 4.0–10.5)
nRBC: 0 % (ref 0.0–0.2)

## 2022-04-15 LAB — CMP (CANCER CENTER ONLY)
ALT: 9 U/L (ref 0–44)
AST: 15 U/L (ref 15–41)
Albumin: 3.6 g/dL (ref 3.5–5.0)
Alkaline Phosphatase: 77 U/L (ref 38–126)
Anion gap: 1 — ABNORMAL LOW (ref 5–15)
BUN: 14 mg/dL (ref 8–23)
CO2: 31 mmol/L (ref 22–32)
Calcium: 8.9 mg/dL (ref 8.9–10.3)
Chloride: 107 mmol/L (ref 98–111)
Creatinine: 0.52 mg/dL — ABNORMAL LOW (ref 0.61–1.24)
GFR, Estimated: >60 mL/min (ref >60)
Glucose, Bld: 73 mg/dL (ref 70–99)
Potassium: 4.6 mmol/L (ref 3.5–5.1)
Sodium: 139 mmol/L (ref 135–145)
Total Bilirubin: 0.3 mg/dL (ref 0.3–1.2)
Total Protein: 5.9 g/dL — ABNORMAL LOW (ref 6.5–8.1)

## 2022-04-15 MED ORDER — HEPARIN SOD (PORK) LOCK FLUSH 100 UNIT/ML IV SOLN
500.0000 [IU] | Freq: Once | INTRAVENOUS | Status: AC
  Administered 2022-04-15: 500 [IU]

## 2022-04-15 MED ORDER — SODIUM CHLORIDE 0.9% FLUSH
10.0000 mL | Freq: Once | INTRAVENOUS | Status: AC
  Administered 2022-04-15: 10 mL

## 2022-04-17 ENCOUNTER — Telehealth: Payer: Self-pay | Admitting: Internal Medicine

## 2022-04-17 NOTE — Telephone Encounter (Signed)
Called patient regarding upcoming September appointments, spoke with son. Patient will be notified.

## 2022-04-18 ENCOUNTER — Telehealth: Payer: Self-pay | Admitting: Internal Medicine

## 2022-04-18 NOTE — Telephone Encounter (Signed)
Left message with rescheduled upcoming appointment due to flush room availability.

## 2022-04-22 ENCOUNTER — Inpatient Hospital Stay: Payer: Medicare Other

## 2022-04-22 ENCOUNTER — Other Ambulatory Visit: Payer: Medicare Other

## 2022-04-24 ENCOUNTER — Inpatient Hospital Stay: Payer: Medicare Other | Attending: Internal Medicine

## 2022-04-24 ENCOUNTER — Ambulatory Visit (HOSPITAL_COMMUNITY)
Admission: RE | Admit: 2022-04-24 | Discharge: 2022-04-24 | Disposition: A | Discharge status: Home / Self Care | Payer: Medicare Other | Source: Ambulatory Visit | Attending: Internal Medicine | Admitting: Internal Medicine

## 2022-04-24 DIAGNOSIS — K769 Liver disease, unspecified: Secondary | ICD-10-CM | POA: Diagnosis not present

## 2022-04-24 DIAGNOSIS — N3289 Other specified disorders of bladder: Secondary | ICD-10-CM | POA: Diagnosis not present

## 2022-04-24 DIAGNOSIS — J439 Emphysema, unspecified: Secondary | ICD-10-CM | POA: Diagnosis not present

## 2022-04-24 DIAGNOSIS — J8410 Pulmonary fibrosis, unspecified: Secondary | ICD-10-CM | POA: Diagnosis not present

## 2022-04-24 DIAGNOSIS — C3482 Malignant neoplasm of overlapping sites of left bronchus and lung: Secondary | ICD-10-CM | POA: Insufficient documentation

## 2022-04-24 DIAGNOSIS — C3490 Malignant neoplasm of unspecified part of unspecified bronchus or lung: Secondary | ICD-10-CM | POA: Diagnosis not present

## 2022-04-24 DIAGNOSIS — N200 Calculus of kidney: Secondary | ICD-10-CM | POA: Diagnosis not present

## 2022-04-24 DIAGNOSIS — K760 Fatty (change of) liver, not elsewhere classified: Secondary | ICD-10-CM | POA: Diagnosis not present

## 2022-04-24 DIAGNOSIS — Z452 Encounter for adjustment and management of vascular access device: Secondary | ICD-10-CM | POA: Insufficient documentation

## 2022-04-24 DIAGNOSIS — C787 Secondary malignant neoplasm of liver and intrahepatic bile duct: Secondary | ICD-10-CM | POA: Insufficient documentation

## 2022-04-24 DIAGNOSIS — C782 Secondary malignant neoplasm of pleura: Secondary | ICD-10-CM | POA: Insufficient documentation

## 2022-04-24 DIAGNOSIS — Z5112 Encounter for antineoplastic immunotherapy: Secondary | ICD-10-CM | POA: Insufficient documentation

## 2022-04-24 DIAGNOSIS — Z8719 Personal history of other diseases of the digestive system: Secondary | ICD-10-CM | POA: Insufficient documentation

## 2022-04-24 DIAGNOSIS — J90 Pleural effusion, not elsewhere classified: Secondary | ICD-10-CM | POA: Diagnosis not present

## 2022-04-24 MED ORDER — IOHEXOL 300 MG/ML  SOLN
100.0000 mL | Freq: Once | INTRAMUSCULAR | Status: AC | PRN
  Administered 2022-04-24: 100 mL via INTRAVENOUS

## 2022-04-24 MED ORDER — IOHEXOL 350 MG/ML SOLN: 75.0000 mL | Freq: Once | INTRAVENOUS | Status: DC | PRN

## 2022-04-28 ENCOUNTER — Inpatient Hospital Stay: Payer: Medicare Other | Admitting: Nutrition

## 2022-04-28 ENCOUNTER — Other Ambulatory Visit: Payer: Self-pay

## 2022-04-28 ENCOUNTER — Other Ambulatory Visit: Payer: Medicare Other

## 2022-04-28 ENCOUNTER — Inpatient Hospital Stay: Payer: Medicare Other

## 2022-04-28 ENCOUNTER — Encounter: Payer: Self-pay | Admitting: Internal Medicine

## 2022-04-28 ENCOUNTER — Inpatient Hospital Stay (HOSPITAL_BASED_OUTPATIENT_CLINIC_OR_DEPARTMENT_OTHER): Payer: Medicare Other | Admitting: Internal Medicine

## 2022-04-28 VITALS — BP 110/65 | HR 70 | Temp 97.8°F | Resp 18

## 2022-04-28 DIAGNOSIS — C3482 Malignant neoplasm of overlapping sites of left bronchus and lung: Secondary | ICD-10-CM

## 2022-04-28 DIAGNOSIS — C787 Secondary malignant neoplasm of liver and intrahepatic bile duct: Secondary | ICD-10-CM | POA: Diagnosis not present

## 2022-04-28 DIAGNOSIS — Z5112 Encounter for antineoplastic immunotherapy: Secondary | ICD-10-CM | POA: Diagnosis not present

## 2022-04-28 DIAGNOSIS — Z8719 Personal history of other diseases of the digestive system: Secondary | ICD-10-CM | POA: Diagnosis not present

## 2022-04-28 DIAGNOSIS — Z452 Encounter for adjustment and management of vascular access device: Secondary | ICD-10-CM | POA: Diagnosis not present

## 2022-04-28 DIAGNOSIS — C782 Secondary malignant neoplasm of pleura: Secondary | ICD-10-CM | POA: Diagnosis not present

## 2022-04-28 DIAGNOSIS — Z95828 Presence of other vascular implants and grafts: Secondary | ICD-10-CM

## 2022-04-28 LAB — CBC WITH DIFFERENTIAL (CANCER CENTER ONLY)
Abs Immature Granulocytes: 0.04 10*3/uL (ref 0.00–0.07)
Basophils Absolute: 0 10*3/uL (ref 0.0–0.1)
Basophils Relative: 1 %
Eosinophils Absolute: 0 10*3/uL (ref 0.0–0.5)
Eosinophils Relative: 1 %
HCT: 36.7 % — ABNORMAL LOW (ref 39.0–52.0)
Hemoglobin: 11.9 g/dL — ABNORMAL LOW (ref 13.0–17.0)
Immature Granulocytes: 1 %
Lymphocytes Relative: 42 %
Lymphs Abs: 1.4 10*3/uL (ref 0.7–4.0)
MCH: 29.8 pg (ref 26.0–34.0)
MCHC: 32.4 g/dL (ref 30.0–36.0)
MCV: 92 fL (ref 80.0–100.0)
Monocytes Absolute: 0.8 10*3/uL (ref 0.1–1.0)
Monocytes Relative: 24 %
Neutro Abs: 1 10*3/uL — ABNORMAL LOW (ref 1.7–7.7)
Neutrophils Relative %: 31 %
Platelet Count: 259 10*3/uL (ref 150–400)
RBC: 3.99 MIL/uL — ABNORMAL LOW (ref 4.22–5.81)
RDW: 17.9 % — ABNORMAL HIGH (ref 11.5–15.5)
WBC Count: 3.2 10*3/uL — ABNORMAL LOW (ref 4.0–10.5)
nRBC: 0 % (ref 0.0–0.2)

## 2022-04-28 LAB — CMP (CANCER CENTER ONLY)
ALT: 8 U/L (ref 0–44)
AST: 16 U/L (ref 15–41)
Albumin: 3.7 g/dL (ref 3.5–5.0)
Alkaline Phosphatase: 81 U/L (ref 38–126)
Anion gap: 4 — ABNORMAL LOW (ref 5–15)
BUN: 20 mg/dL (ref 8–23)
CO2: 27 mmol/L (ref 22–32)
Calcium: 9.2 mg/dL (ref 8.9–10.3)
Chloride: 109 mmol/L (ref 98–111)
Creatinine: 0.92 mg/dL (ref 0.61–1.24)
GFR, Estimated: >60 mL/min (ref >60)
Glucose, Bld: 121 mg/dL — ABNORMAL HIGH (ref 70–99)
Potassium: 4.1 mmol/L (ref 3.5–5.1)
Sodium: 140 mmol/L (ref 135–145)
Total Bilirubin: 0.3 mg/dL (ref 0.3–1.2)
Total Protein: 6.2 g/dL — ABNORMAL LOW (ref 6.5–8.1)

## 2022-04-28 MED ORDER — SODIUM CHLORIDE 0.9% FLUSH
10.0000 mL | Freq: Once | INTRAVENOUS | Status: AC
  Administered 2022-04-28: 10 mL

## 2022-04-28 MED ORDER — SODIUM CHLORIDE 0.9% FLUSH
10.0000 mL | INTRAVENOUS | Status: DC | PRN
  Administered 2022-04-28: 10 mL

## 2022-04-28 MED ORDER — SODIUM CHLORIDE 0.9 % IV SOLN
1500.0000 mg | Freq: Once | INTRAVENOUS | Status: AC
  Administered 2022-04-28: 1500 mg via INTRAVENOUS
  Filled 2022-04-28: qty 30

## 2022-04-28 MED ORDER — HEPARIN SOD (PORK) LOCK FLUSH 100 UNIT/ML IV SOLN
500.0000 [IU] | Freq: Once | INTRAVENOUS | Status: AC | PRN
  Administered 2022-04-28: 500 [IU]

## 2022-04-28 MED ORDER — SODIUM CHLORIDE 0.9 % IV SOLN: Freq: Once | INTRAVENOUS | Status: AC

## 2022-04-28 NOTE — Progress Notes (Signed)
Nutrition follow-up completed with patient and wife during infusion for extensive stage small cell lung cancer receiving chemo and immunotherapy every 21 days.  Weight increased to 127.1 pounds September 11 from 125 pounds 3.2 ounces on August 1.  Noted glucose 121.  Patient reports he has a good appetite and continues to eat well at mealtimes.  He is drinking some Ensure but amount varies.  He denies nausea, vomiting, diarrhea, and constipation.  Reports bowel movements can cause some burning.  He was told to see his PCP regarding this.  He tries to watch processed foods.  States he has been eating ice cream and really enjoys it.  Nutrition diagnosis: Severe malnutrition, improving.  Intervention: Educated to continue strategies for increased calories and protein in small frequent meals with snacks.  Reviewed high-protein foods. Brief discussion on foods to help with bowel regimen. Encouraged Ensure Plus or equivalent twice daily.  Provided coupons. Educated on sugar and cancer and provided nutrition facts sheet.  Monitoring, evaluation, goals: Patient will tolerate adequate calories and protein to minimize weight loss.  Next visit: Monday, October 9 during infusion.  **Disclaimer: This note was dictated with voice recognition software. Similar sounding words can inadvertently be transcribed and this note may contain transcription errors which may not have been corrected upon publication of note.**

## 2022-04-28 NOTE — Progress Notes (Signed)
[facility] Telephone:([phone]   Fax:(336) [phone]  OFFICE PROGRESS NOTE  Shon Baton, MD [place] [address]  DIAGNOSIS: Extensive stage (T3, N2, M1b) small cell lung cancer presented with obstructive left upper lobe lung mass in addition to mediastinal lymphadenopathy and malignant left pleural effusion as well as pleural-based metastasis as well as suspicious liver metastasis diagnosed in June 2023.  PRIOR THERAPY: None  CURRENT THERAPY: Palliative systemic chemotherapy with carboplatin initiated for AUC of 4 on day 1 and etoposide 80 Mg/M2 on days 1, 2 and 3 with Cosela before the chemotherapy and Imfinzi 1500 Mg IV on day 1 every 3 weeks.  Status post 4 cycles.  Starting from cycle #5 the patient will be on maintenance treatment with Imfinzi 1500 Mg IV every 4 weeks.  INTERVAL HISTORY: Pedro Pope. 81 y.o. male returns to the clinic today for follow-up visit accompanied by his wife.  The patient is feeling fine today with no concerning complaints.  His oxygen saturation is 96% and he does not use home oxygen anymore.  He continues to have intermittent abdominal pain especially with bowel movement but no bleeding, nausea, vomiting or constipation.  He has history of hemorrhoids.  He denied having any current chest pain, shortness of breath, cough or hemoptysis.  He has no fever or chills.  He denied having any headache or visual changes.  He tolerated the last cycle of his chemotherapy fairly well.  The patient is here today for evaluation with repeat CT scan of the chest, abdomen and pelvis for restaging of his disease.   MEDICAL HISTORY: Past Medical History:  Diagnosis Date   Anosmia    CAD (coronary artery disease), native coronary artery    a. 10/29/2013 antlat STEMI s/p DES to pLAD; residual 80 % proximal and 60% mid LCx diease   Chronic headaches    Dressler's syndrome (HCC)    a. placed on colchicine    ED (erectile  dysfunction)    Elevated TSH    Fatty liver    Gout    Ischemic cardiomyopathy    a. 2D ECHO 11/04/14 w/ EF 40-45% with LV WMA, G2DD, mild MR, mild LA dilation, mod TR.   PAF (paroxysmal atrial fibrillation) ([facility])    a. Dx on 11/03/14 admission. Not placed on longterm AC due to DAPT w/ receent DES. Will plan for outpt heart monitor    Pre-diabetes    a. HgA1c 6.1 in 10/2014   Prostate cancer [facility])    s/p prostatectomy and XRT   Rheumatic fever 1947    ALLERGIES:  is allergic to penicillins, clindamycin, corn syrup [glucose], and aspirin adult low [aspirin].  MEDICATIONS:  Current Outpatient Medications  Medication Sig Dispense Refill   acetaminophen (TYLENOL) 500 MG tablet Take 500 mg by mouth every 6 (six) hours as needed for mild pain or moderate pain.     ascorbic acid (VITAMIN C) 500 MG tablet Take 1,000 mg by mouth daily.     Blood Pressure Monitor KIT For daily blood pressure monitoring;  Arm Cuff 1 each 0   carvedilol (COREG) 3.125 MG tablet TAKE 1 TABLET BY MOUTH TWICE A DAY WITH MEALS (Patient taking differently: Take 3.125 mg by mouth daily. Take 1 tablet by mouth daily) 180 tablet 3   Cholecalciferol (VITAMIN D3) 25 MCG (1000 UT) CAPS Take 1 capsule by mouth daily.     escitalopram (LEXAPRO) 5 MG tablet Take 5 mg by mouth  daily.     lidocaine-prilocaine (EMLA) cream Apply 1 Application topically as needed. 30 g 2   oxyCODONE (ROXICODONE) 5 MG immediate release tablet Take 0.5-1 tablets (2.5-5 mg total) by mouth every 4 (four) hours as needed. 30 tablet 0   prochlorperazine (COMPAZINE) 10 MG tablet Take 1 tablet (10 mg total) by mouth every 6 (six) hours as needed for nausea or vomiting. 30 tablet 0   No current facility-administered medications for this visit.    SURGICAL HISTORY:  Past Surgical History:  Procedure Laterality Date   CORONARY ANGIOPLASTY WITH STENT PLACEMENT  10/30/14   STEMI s/p DES to proximal LAD; residual 80 % proximal and 60% mid LCx diease   IR  IMAGING GUIDED PORT INSERTION  03/12/2022   LEFT HEART CATHETERIZATION WITH CORONARY ANGIOGRAM N/A 10/30/2014   Procedure: LEFT HEART CATHETERIZATION WITH CORONARY ANGIOGRAM;  Surgeon: Lorretta Harp, MD;  Location: [facility] CATH LAB;  Service: Cardiovascular;  Laterality: N/A;   PERCUTANEOUS CORONARY STENT INTERVENTION (PCI-S)  10/30/2014   Procedure: PERCUTANEOUS CORONARY STENT INTERVENTION (PCI-S);  Surgeon: Lorretta Harp, MD;  Location: [facility] CATH LAB;  Service: Cardiovascular;;   PROSTATECTOMY     THORACENTESIS Left 12/31/2021   Procedure: THORACENTESIS;  Surgeon: Candee Furbish, MD;  Location: [facility] ENDOSCOPY;  Service: Pulmonary;  Laterality: Left;   TONSILLECTOMY     VIDEO ASSISTED THORACOSCOPY (VATS)/DECORTICATION Left 01/21/2022   Procedure: VIDEO ASSISTED THORACOSCOPY (VATS)/DECORTICATION;  Surgeon: Lajuana Matte, MD;  Location: [facility];  Service: Thoracic;  Laterality: Left;    REVIEW OF SYSTEMS:  Constitutional: negative Eyes: negative Ears, nose, mouth, throat, and face: negative Respiratory: negative Cardiovascular: negative Gastrointestinal: positive for abdominal pain and diarrhea Genitourinary:negative Integument/breast: negative Hematologic/lymphatic: negative Musculoskeletal:negative Neurological: negative Behavioral/Psych: negative Endocrine: negative Allergic/Immunologic: negative   PHYSICAL EXAMINATION: General appearance: alert, cooperative, fatigued, and no distress Head: Normocephalic, without obvious abnormality, atraumatic Neck: no adenopathy, no JVD, supple, symmetrical, trachea midline, and thyroid not enlarged, symmetric, no tenderness/mass/nodules Lymph nodes: Cervical, supraclavicular, and axillary nodes normal. Resp: clear to auscultation bilaterally Back: symmetric, no curvature. ROM normal. No CVA tenderness. Cardio: regular rate and rhythm, S1, S2 normal, no murmur, click, rub or gallop GI: soft, non-tender; bowel sounds normal; no masses,  no  organomegaly Extremities: extremities normal, atraumatic, no cyanosis or edema Neurologic: Alert and oriented X 3, normal strength and tone. Normal symmetric reflexes. Normal coordination and gait  ECOG PERFORMANCE STATUS: 1 - Symptomatic but completely ambulatory  Blood pressure (!) 103/53, pulse 68, temperature (!) 97.5 F (36.4 C), temperature source Oral, resp. rate 15, weight 127 lb 1.6 oz (57.7 kg), SpO2 96 %.  LABORATORY DATA: Lab Results  Component Value Date   WBC 3.2 (L) 04/28/2022   HGB 11.9 (L) 04/28/2022   HCT 36.7 (L) 04/28/2022   MCV 92.0 04/28/2022   PLT 259 04/28/2022      Chemistry      Component Value Date/Time   NA 140 04/28/2022 0831   K 4.1 04/28/2022 0831   CL 109 04/28/2022 0831   CO2 27 04/28/2022 0831   BUN 20 04/28/2022 0831   CREATININE 0.92 04/28/2022 0831      Component Value Date/Time   CALCIUM 9.2 04/28/2022 0831   ALKPHOS 81 04/28/2022 0831   AST 16 04/28/2022 0831   ALT 8 04/28/2022 0831   BILITOT 0.3 04/28/2022 0831       RADIOGRAPHIC STUDIES: CT Chest W Contrast  Result Date: 04/25/2022 CLINICAL DATA:  Small cell lung cancer.  *  Tracking Code: BO * EXAM: CT CHEST, ABDOMEN, AND PELVIS WITH CONTRAST TECHNIQUE: Multidetector CT imaging of the chest, abdomen and pelvis was performed following the standard protocol during bolus administration of intravenous contrast. RADIATION DOSE REDUCTION: This exam was performed according to the departmental dose-optimization program which includes automated exposure control, adjustment of the mA and/or kV according to patient size and/or use of iterative reconstruction technique. CONTRAST:  134m OMNIPAQUE IOHEXOL 300 MG/ML  SOLN COMPARISON:  03/13/2022 and 01/17/2022. FINDINGS: CT CHEST FINDINGS Cardiovascular: Right IJ Port-A-Cath terminates in the low SVC. Atherosclerotic calcification of the aorta, aortic valve and coronary arteries. Heart size normal. No pericardial effusion. Mediastinum/Nodes:  Mediastinal and hilar lymph nodes are not enlarged by CT size criteria. No axillary adenopathy. Esophagus is grossly unremarkable. Lungs/Pleura: Biapical pleuroparenchymal scarring. Centrilobular and paraseptal emphysema. Peripheral and basilar predominant subpleural reticulation, traction bronchiectasis/bronchiolectasis, ground-glass and probable honeycombing. Minimally improved peripheral subpleural opacification in the posterolateral left upper lobe with trace adjacent loculated left pleural fluid. Left PleurX catheter has been removed. Minimal residual dependent volume loss and trace pleural fluid in the lower left hemithorax. There are thickened areas of increased pleural attenuation in the posteromedial lower left hemithorax. Musculoskeletal: Degenerative changes in the spine. No worrisome lytic or sclerotic lesions. CT ABDOMEN PELVIS FINDINGS Hepatobiliary: 8 mm hypoattenuating lesion in the right hepatic lobe (2/55), not seen on 03/13/2022 but visualized on 01/17/2022. Liver is decreased in attenuation diffusely. Tiny stone in the gallbladder. No biliary ductal dilatation. Pancreas: Negative. Spleen: Negative. Adrenals/Urinary Tract: Adrenal glands are unremarkable. Stone in the right kidney. Kidneys are otherwise unremarkable. Ureters are decompressed. Bladder is minimally thick-walled but also under distended. Stomach/Bowel: Stomach, small bowel, appendix and colon are unremarkable. Vascular/Lymphatic: Atherosclerotic calcification of the aorta with a chronic dissection flap. Markedly attenuated left common iliac artery with reconstitution at the bifurcation. Finding is unchanged from 03/13/2022. No pathologically enlarged lymph nodes. Reproductive: Prostatectomy. Other: No free fluid. Mesenteries and peritoneum are unremarkable. Umbilical hernia contains fat. Musculoskeletal: Degenerative changes in the spine. No worrisome lytic or sclerotic lesions. IMPRESSION: 1. Trace residual left pleural effusion  with associated pleural nodularity, consistent with metastatic disease. 2. 8 mm lesion in the right hepatic lobe has recurred in the interval from 03/13/2022, indicative of metastatic disease. 3. Slight improvement in subpleural consolidation in the left upper and left lower lobes. 4. Pulmonary fibrosis, likely usual interstitial pneumonitis. 5. Hepatic steatosis. 6. Cholelithiasis. 7. Right renal stone. 8. Aortic atherosclerosis (ICD10-I70.0). Markedly attenuated left common iliac artery with reconstitution at the bifurcation. Electronically Signed   By: MLorin PicketM.D.   On: 04/25/2022 16:41   CT Abdomen Pelvis W Contrast  Result Date: 04/25/2022 CLINICAL DATA:  Small cell lung cancer.  * Tracking Code: BO * EXAM: CT CHEST, ABDOMEN, AND PELVIS WITH CONTRAST TECHNIQUE: Multidetector CT imaging of the chest, abdomen and pelvis was performed following the standard protocol during bolus administration of intravenous contrast. RADIATION DOSE REDUCTION: This exam was performed according to the departmental dose-optimization program which includes automated exposure control, adjustment of the mA and/or kV according to patient size and/or use of iterative reconstruction technique. CONTRAST:  10103mOMNIPAQUE IOHEXOL 300 MG/ML  SOLN COMPARISON:  03/13/2022 and 01/17/2022. FINDINGS: CT CHEST FINDINGS Cardiovascular: Right IJ Port-A-Cath terminates in the low SVC. Atherosclerotic calcification of the aorta, aortic valve and coronary arteries. Heart size normal. No pericardial effusion. Mediastinum/Nodes: Mediastinal and hilar lymph nodes are not enlarged by CT size criteria. No axillary adenopathy. Esophagus is grossly unremarkable. Lungs/Pleura: Biapical  pleuroparenchymal scarring. Centrilobular and paraseptal emphysema. Peripheral and basilar predominant subpleural reticulation, traction bronchiectasis/bronchiolectasis, ground-glass and probable honeycombing. Minimally improved peripheral subpleural opacification in  the posterolateral left upper lobe with trace adjacent loculated left pleural fluid. Left PleurX catheter has been removed. Minimal residual dependent volume loss and trace pleural fluid in the lower left hemithorax. There are thickened areas of increased pleural attenuation in the posteromedial lower left hemithorax. Musculoskeletal: Degenerative changes in the spine. No worrisome lytic or sclerotic lesions. CT ABDOMEN PELVIS FINDINGS Hepatobiliary: 8 mm hypoattenuating lesion in the right hepatic lobe (2/55), not seen on 03/13/2022 but visualized on 01/17/2022. Liver is decreased in attenuation diffusely. Tiny stone in the gallbladder. No biliary ductal dilatation. Pancreas: Negative. Spleen: Negative. Adrenals/Urinary Tract: Adrenal glands are unremarkable. Stone in the right kidney. Kidneys are otherwise unremarkable. Ureters are decompressed. Bladder is minimally thick-walled but also under distended. Stomach/Bowel: Stomach, small bowel, appendix and colon are unremarkable. Vascular/Lymphatic: Atherosclerotic calcification of the aorta with a chronic dissection flap. Markedly attenuated left common iliac artery with reconstitution at the bifurcation. Finding is unchanged from 03/13/2022. No pathologically enlarged lymph nodes. Reproductive: Prostatectomy. Other: No free fluid. Mesenteries and peritoneum are unremarkable. Umbilical hernia contains fat. Musculoskeletal: Degenerative changes in the spine. No worrisome lytic or sclerotic lesions. IMPRESSION: 1. Trace residual left pleural effusion with associated pleural nodularity, consistent with metastatic disease. 2. 8 mm lesion in the right hepatic lobe has recurred in the interval from 03/13/2022, indicative of metastatic disease. 3. Slight improvement in subpleural consolidation in the left upper and left lower lobes. 4. Pulmonary fibrosis, likely usual interstitial pneumonitis. 5. Hepatic steatosis. 6. Cholelithiasis. 7. Right renal stone. 8. Aortic  atherosclerosis (ICD10-I70.0). Markedly attenuated left common iliac artery with reconstitution at the bifurcation. Electronically Signed   By: Lorin Picket M.D.   On: 04/25/2022 16:41    ASSESSMENT AND PLAN: This is a very pleasant 81 years old white male with Extensive stage (T3, N2, M1 a) small cell lung cancer presented with obstructive left upper lobe lung mass in addition to mediastinal lymphadenopathy and malignant left pleural effusion as well as pleural-based metastasis and suspicious liver metastasis diagnosed in June 2023. The patient is currently undergoing systemic chemotherapy with carboplatin for AUC of 4 on day 1, etoposide 80 Mg/M2 on days 1, 2 and 3 with Imfinzi 240 Mg/M2 on the days before chemotherapy and Imfinzi 1500 Mg IV on day 1 every 3 weeks.  Status post 4 cycles.  Starting from cycle #5 he will be on maintenance treatment with immunotherapy with Imfinzi 1500 Mg IV every 4 weeks. The patient tolerated the last cycle of his treatment fairly well with no concerning adverse effects.  He had significant improvement in his condition since the start of the chemotherapy.  He does not have any oxygen requirement anymore. He had repeat CT scan of the chest, abdomen and pelvis performed recently.  I personally and independently reviewed the scan images and discussed the result and showed the images to the patient and his wife today. His scan showed continuous improvement of his disease but there was 0.8 cm lesion in the right hepatic lobe that had recurred in the interval since March 13, 2022. I explained to the patient that this lesion is suspicious for disease recurrence but it is very small.  I recommended for him to continue his current treatment with the maintenance Imfinzi 1500 Mg IV every 4 weeks and he will proceed with cycle #5 today. I will consider repeating his imaging  studies after cycle #6 for further evaluation of this hepatic lesion.  The patient and his wife understand the  poor prognosis of small cell lung cancer and the risk is high for disease recurrence and progression. He and his wife are in agreement with the current plan. He will come back for follow-up visit in 4 weeks for evaluation before starting cycle #6.  The patient voices understanding of current disease status and treatment options and is in agreement with the current care plan.  All questions were answered. The patient knows to call the clinic with any problems, questions or concerns. We can certainly see the patient much sooner if necessary.  The total time spent in the appointment was 30 minutes.  Disclaimer: This note was dictated with voice recognition software. Similar sounding words can inadvertently be transcribed and may not be corrected upon review.

## 2022-04-28 NOTE — Patient Instructions (Signed)
[facility] MEDICAL ONCOLOGY  Discharge Instructions: Thank you for choosing [facility] to provide your oncology and hematology care.   If you have a lab appointment with the Cancer Center, please go directly to the Cancer Center and check in at the registration area.   Wear comfortable clothing and clothing appropriate for easy access to any Portacath or PICC line.   We strive to give you quality time with your provider. You may need to reschedule your appointment if you arrive late (15 or more minutes).  Arriving late affects you and other patients whose appointments are after yours.  Also, if you miss three or more appointments without notifying the office, you may be dismissed from the clinic at the provider's discretion.      For prescription refill requests, have your pharmacy contact our office and allow 72 hours for refills to be completed.    Today you received the following chemotherapy and/or immunotherapy agents; Imfinzi      To help prevent nausea and vomiting after your treatment, we encourage you to take your nausea medication as directed.  BELOW ARE SYMPTOMS THAT SHOULD BE REPORTED IMMEDIATELY: *FEVER GREATER THAN 100.4 F (38 C) OR HIGHER *CHILLS OR SWEATING *NAUSEA AND VOMITING THAT IS NOT CONTROLLED WITH YOUR NAUSEA MEDICATION *UNUSUAL SHORTNESS OF BREATH *UNUSUAL BRUISING OR BLEEDING *URINARY PROBLEMS (pain or burning when urinating, or frequent urination) *BOWEL PROBLEMS (unusual diarrhea, constipation, pain near the anus) TENDERNESS IN MOUTH AND THROAT WITH OR WITHOUT PRESENCE OF ULCERS (sore throat, sores in mouth, or a toothache) UNUSUAL RASH, SWELLING OR PAIN  UNUSUAL VAGINAL DISCHARGE OR ITCHING   Items with * indicate a potential emergency and should be followed up as soon as possible or go to the Emergency Department if any problems should occur.  Please show the CHEMOTHERAPY ALERT CARD or IMMUNOTHERAPY ALERT CARD at check-in to  the Emergency Department and triage nurse.  Should you have questions after your visit or need to cancel or reschedule your appointment, please contact [facility]  Dept: [phone]  and follow the prompts.  Office hours are 8:00 a.m. to 4:30 p.m. Monday - Friday. Please note that voicemails left after 4:00 p.m. may not be returned until the following business day.  We are closed weekends and major holidays. You have access to a nurse at all times for urgent questions. Please call the main number to the clinic Dept: [phone] and follow the prompts.   For any non-urgent questions, you may also contact your provider using MyChart. We now offer e-Visits for anyone 18 and older to request care online for non-urgent symptoms. For details visit [email or web address].   Also download the MyChart app! Go to the app store, search "MyChart", open the app, select [facility], and log in with your MyChart username and password.  Masks are optional in the cancer centers. If you would like for your care team to wear a mask while they are taking care of you, please let them know. You may have one support person who is at least 81 years old accompany you for your appointments. 

## 2022-04-28 NOTE — Progress Notes (Signed)
Per Dr. Julien Nordmann ok to treat with ANC of 1.0 today for treatment.

## 2022-05-06 ENCOUNTER — Inpatient Hospital Stay: Payer: Medicare Other

## 2022-05-06 ENCOUNTER — Other Ambulatory Visit: Payer: Self-pay

## 2022-05-06 DIAGNOSIS — Z8719 Personal history of other diseases of the digestive system: Secondary | ICD-10-CM | POA: Diagnosis not present

## 2022-05-06 DIAGNOSIS — Z5112 Encounter for antineoplastic immunotherapy: Secondary | ICD-10-CM | POA: Diagnosis not present

## 2022-05-06 DIAGNOSIS — C782 Secondary malignant neoplasm of pleura: Secondary | ICD-10-CM | POA: Diagnosis not present

## 2022-05-06 DIAGNOSIS — C787 Secondary malignant neoplasm of liver and intrahepatic bile duct: Secondary | ICD-10-CM | POA: Diagnosis not present

## 2022-05-06 DIAGNOSIS — Z95828 Presence of other vascular implants and grafts: Secondary | ICD-10-CM

## 2022-05-06 DIAGNOSIS — Z452 Encounter for adjustment and management of vascular access device: Secondary | ICD-10-CM | POA: Diagnosis not present

## 2022-05-06 DIAGNOSIS — C3490 Malignant neoplasm of unspecified part of unspecified bronchus or lung: Secondary | ICD-10-CM

## 2022-05-06 DIAGNOSIS — C3482 Malignant neoplasm of overlapping sites of left bronchus and lung: Secondary | ICD-10-CM

## 2022-05-06 LAB — CBC WITH DIFFERENTIAL (CANCER CENTER ONLY)
Abs Immature Granulocytes: 0.07 10*3/uL (ref 0.00–0.07)
Basophils Absolute: 0 10*3/uL (ref 0.0–0.1)
Basophils Relative: 1 %
Eosinophils Absolute: 0.2 10*3/uL (ref 0.0–0.5)
Eosinophils Relative: 2 %
HCT: 36.3 % — ABNORMAL LOW (ref 39.0–52.0)
Hemoglobin: 11.7 g/dL — ABNORMAL LOW (ref 13.0–17.0)
Immature Granulocytes: 1 %
Lymphocytes Relative: 24 %
Lymphs Abs: 1.8 10*3/uL (ref 0.7–4.0)
MCH: 29.3 pg (ref 26.0–34.0)
MCHC: 32.2 g/dL (ref 30.0–36.0)
MCV: 90.8 fL (ref 80.0–100.0)
Monocytes Absolute: 1.3 10*3/uL — ABNORMAL HIGH (ref 0.1–1.0)
Monocytes Relative: 17 %
Neutro Abs: 4.2 10*3/uL (ref 1.7–7.7)
Neutrophils Relative %: 55 %
Platelet Count: 232 10*3/uL (ref 150–400)
RBC: 4 MIL/uL — ABNORMAL LOW (ref 4.22–5.81)
RDW: 17.4 % — ABNORMAL HIGH (ref 11.5–15.5)
WBC Count: 7.6 10*3/uL (ref 4.0–10.5)
nRBC: 0 % (ref 0.0–0.2)

## 2022-05-06 LAB — CMP (CANCER CENTER ONLY)
ALT: 7 U/L (ref 0–44)
AST: 16 U/L (ref 15–41)
Albumin: 3.6 g/dL (ref 3.5–5.0)
Alkaline Phosphatase: 81 U/L (ref 38–126)
Anion gap: 4 — ABNORMAL LOW (ref 5–15)
BUN: 12 mg/dL (ref 8–23)
CO2: 30 mmol/L (ref 22–32)
Calcium: 8.8 mg/dL — ABNORMAL LOW (ref 8.9–10.3)
Chloride: 108 mmol/L (ref 98–111)
Creatinine: 0.73 mg/dL (ref 0.61–1.24)
GFR, Estimated: >60 mL/min (ref >60)
Glucose, Bld: 76 mg/dL (ref 70–99)
Potassium: 4.2 mmol/L (ref 3.5–5.1)
Sodium: 142 mmol/L (ref 135–145)
Total Bilirubin: 0.3 mg/dL (ref 0.3–1.2)
Total Protein: 6 g/dL — ABNORMAL LOW (ref 6.5–8.1)

## 2022-05-06 MED ORDER — HEPARIN SOD (PORK) LOCK FLUSH 100 UNIT/ML IV SOLN
500.0000 [IU] | Freq: Once | INTRAVENOUS | Status: AC
  Administered 2022-05-06: 500 [IU]

## 2022-05-06 MED ORDER — SODIUM CHLORIDE 0.9% FLUSH
10.0000 mL | Freq: Once | INTRAVENOUS | Status: AC
  Administered 2022-05-06: 10 mL

## 2022-05-13 ENCOUNTER — Other Ambulatory Visit: Payer: Self-pay

## 2022-05-13 ENCOUNTER — Inpatient Hospital Stay: Payer: Medicare Other

## 2022-05-13 DIAGNOSIS — Z8719 Personal history of other diseases of the digestive system: Secondary | ICD-10-CM | POA: Diagnosis not present

## 2022-05-13 DIAGNOSIS — Z5112 Encounter for antineoplastic immunotherapy: Secondary | ICD-10-CM | POA: Diagnosis not present

## 2022-05-13 DIAGNOSIS — C782 Secondary malignant neoplasm of pleura: Secondary | ICD-10-CM | POA: Diagnosis not present

## 2022-05-13 DIAGNOSIS — C787 Secondary malignant neoplasm of liver and intrahepatic bile duct: Secondary | ICD-10-CM | POA: Diagnosis not present

## 2022-05-13 DIAGNOSIS — Z452 Encounter for adjustment and management of vascular access device: Secondary | ICD-10-CM | POA: Diagnosis not present

## 2022-05-13 DIAGNOSIS — Z95828 Presence of other vascular implants and grafts: Secondary | ICD-10-CM

## 2022-05-13 DIAGNOSIS — C3482 Malignant neoplasm of overlapping sites of left bronchus and lung: Secondary | ICD-10-CM | POA: Diagnosis not present

## 2022-05-13 MED ORDER — SODIUM CHLORIDE 0.9% FLUSH
10.0000 mL | Freq: Once | INTRAVENOUS | Status: AC
  Administered 2022-05-13: 10 mL

## 2022-05-13 MED ORDER — HEPARIN SOD (PORK) LOCK FLUSH 100 UNIT/ML IV SOLN
500.0000 [IU] | Freq: Once | INTRAVENOUS | Status: AC
  Administered 2022-05-13: 500 [IU]

## 2022-05-15 ENCOUNTER — Other Ambulatory Visit: Payer: Self-pay

## 2022-05-19 ENCOUNTER — Inpatient Hospital Stay: Payer: Medicare Other

## 2022-05-19 ENCOUNTER — Other Ambulatory Visit: Payer: Self-pay

## 2022-05-19 DIAGNOSIS — C3490 Malignant neoplasm of unspecified part of unspecified bronchus or lung: Secondary | ICD-10-CM | POA: Diagnosis not present

## 2022-05-19 DIAGNOSIS — R739 Hyperglycemia, unspecified: Secondary | ICD-10-CM | POA: Diagnosis not present

## 2022-05-19 DIAGNOSIS — K6289 Other specified diseases of anus and rectum: Secondary | ICD-10-CM | POA: Diagnosis not present

## 2022-05-20 ENCOUNTER — Encounter: Payer: Self-pay | Admitting: Internal Medicine

## 2022-05-21 ENCOUNTER — Other Ambulatory Visit: Payer: Self-pay

## 2022-05-22 ENCOUNTER — Telehealth: Payer: Self-pay | Admitting: Cardiovascular Disease

## 2022-05-22 MED ORDER — CARVEDILOL 3.125 MG PO TABS: 3.1250 mg | ORAL_TABLET | Freq: Two times a day (BID) | ORAL | 0 refills | Status: DC

## 2022-05-22 NOTE — Telephone Encounter (Signed)
*  STAT* If patient is at the pharmacy, call can be transferred to refill team.   1. Which medications need to be refilled? (please list name of each medication and dose if known)  new prescription forCarvedilol for 1 time a day  2. Which pharmacy/location (including street and city if local pharmacy) is medication to be sent to? [facility], [place]  3. Do they need a 30 day or 90 day supply? 90 days and refills- patient have an appointment on 06-06-22

## 2022-05-26 ENCOUNTER — Inpatient Hospital Stay: Payer: Medicare Other | Attending: Internal Medicine | Admitting: Internal Medicine

## 2022-05-26 ENCOUNTER — Inpatient Hospital Stay: Payer: Medicare Other

## 2022-05-26 ENCOUNTER — Other Ambulatory Visit: Payer: Self-pay

## 2022-05-26 ENCOUNTER — Encounter: Payer: Self-pay | Admitting: Internal Medicine

## 2022-05-26 ENCOUNTER — Inpatient Hospital Stay: Payer: Medicare Other | Admitting: Nutrition

## 2022-05-26 VITALS — BP 108/59 | HR 60 | Temp 97.8°F | Resp 18 | Wt 127.5 lb

## 2022-05-26 DIAGNOSIS — C3482 Malignant neoplasm of overlapping sites of left bronchus and lung: Secondary | ICD-10-CM

## 2022-05-26 DIAGNOSIS — Z5112 Encounter for antineoplastic immunotherapy: Secondary | ICD-10-CM | POA: Insufficient documentation

## 2022-05-26 DIAGNOSIS — C782 Secondary malignant neoplasm of pleura: Secondary | ICD-10-CM | POA: Insufficient documentation

## 2022-05-26 DIAGNOSIS — Z23 Encounter for immunization: Secondary | ICD-10-CM | POA: Insufficient documentation

## 2022-05-26 DIAGNOSIS — C3490 Malignant neoplasm of unspecified part of unspecified bronchus or lung: Secondary | ICD-10-CM | POA: Diagnosis not present

## 2022-05-26 DIAGNOSIS — Z79899 Other long term (current) drug therapy: Secondary | ICD-10-CM | POA: Diagnosis not present

## 2022-05-26 LAB — CMP (CANCER CENTER ONLY)
ALT: 7 U/L (ref 0–44)
AST: 16 U/L (ref 15–41)
Albumin: 3.6 g/dL (ref 3.5–5.0)
Alkaline Phosphatase: 85 U/L (ref 38–126)
Anion gap: 6 (ref 5–15)
BUN: 16 mg/dL (ref 8–23)
CO2: 27 mmol/L (ref 22–32)
Calcium: 9.1 mg/dL (ref 8.9–10.3)
Chloride: 104 mmol/L (ref 98–111)
Creatinine: 0.86 mg/dL (ref 0.61–1.24)
GFR, Estimated: >60 mL/min (ref >60)
Glucose, Bld: 154 mg/dL — ABNORMAL HIGH (ref 70–99)
Potassium: 3.9 mmol/L (ref 3.5–5.1)
Sodium: 137 mmol/L (ref 135–145)
Total Bilirubin: 0.4 mg/dL (ref 0.3–1.2)
Total Protein: 6.5 g/dL (ref 6.5–8.1)

## 2022-05-26 LAB — CBC WITH DIFFERENTIAL (CANCER CENTER ONLY)
Abs Immature Granulocytes: 0.03 10*3/uL (ref 0.00–0.07)
Basophils Absolute: 0 10*3/uL (ref 0.0–0.1)
Basophils Relative: 0 %
Eosinophils Absolute: 0.8 10*3/uL — ABNORMAL HIGH (ref 0.0–0.5)
Eosinophils Relative: 10 %
HCT: 37.2 % — ABNORMAL LOW (ref 39.0–52.0)
Hemoglobin: 12.3 g/dL — ABNORMAL LOW (ref 13.0–17.0)
Immature Granulocytes: 0 %
Lymphocytes Relative: 20 %
Lymphs Abs: 1.5 10*3/uL (ref 0.7–4.0)
MCH: 29.8 pg (ref 26.0–34.0)
MCHC: 33.1 g/dL (ref 30.0–36.0)
MCV: 90.1 fL (ref 80.0–100.0)
Monocytes Absolute: 0.8 10*3/uL (ref 0.1–1.0)
Monocytes Relative: 11 %
Neutro Abs: 4.6 10*3/uL (ref 1.7–7.7)
Neutrophils Relative %: 59 %
Platelet Count: 210 10*3/uL (ref 150–400)
RBC: 4.13 MIL/uL — ABNORMAL LOW (ref 4.22–5.81)
RDW: 15.5 % (ref 11.5–15.5)
WBC Count: 7.8 10*3/uL (ref 4.0–10.5)
nRBC: 0 % (ref 0.0–0.2)

## 2022-05-26 LAB — TSH: TSH: 3.339 u[IU]/mL (ref 0.350–4.500)

## 2022-05-26 MED ORDER — INFLUENZA VAC A&B SA ADJ QUAD 0.5 ML IM PRSY
0.5000 mL | PREFILLED_SYRINGE | Freq: Once | INTRAMUSCULAR | Status: AC
  Administered 2022-05-26: 0.5 mL via INTRAMUSCULAR
  Filled 2022-05-26: qty 0.5

## 2022-05-26 MED ORDER — SODIUM CHLORIDE 0.9 % IV SOLN
1500.0000 mg | Freq: Once | INTRAVENOUS | Status: AC
  Administered 2022-05-26: 1500 mg via INTRAVENOUS
  Filled 2022-05-26: qty 30

## 2022-05-26 MED ORDER — SODIUM CHLORIDE 0.9% FLUSH
10.0000 mL | INTRAVENOUS | Status: DC | PRN
  Administered 2022-05-26: 10 mL

## 2022-05-26 MED ORDER — SODIUM CHLORIDE 0.9 % IV SOLN: Freq: Once | INTRAVENOUS | Status: AC

## 2022-05-26 MED ORDER — HEPARIN SOD (PORK) LOCK FLUSH 100 UNIT/ML IV SOLN
500.0000 [IU] | Freq: Once | INTRAVENOUS | Status: AC | PRN
  Administered 2022-05-26: 500 [IU]

## 2022-05-26 NOTE — Progress Notes (Signed)
Nutrition follow-up completed with patient during infusion for extensive stage small cell lung cancer.    Weight stable and documented as 127 pounds 8 ounces.  Noted glucose 154.  Patient reports he has cut out all sugar and red meat.  He is trying to eat more vegetables and greens.  He is also eating low fat.  He has stopped eating ice cream because of the sugar.  He has added Premier protein shake which provides 160 cal and 30 g of protein.  He is now drinking 3 glasses of green tea every day.  Nutrition diagnosis: Severe malnutrition, ongoing  Intervention: Educated patient to increase overall calories and protein in small frequent meals and snacks. Again reviewed high-protein foods. Explained "sugar and cancer " myth.  Patient has received evidence-based resources.  Monitoring, evaluation, goals: Patient will work to increase calories and protein to minimize weight loss.  Next visit: To be scheduled as needed.  Please refer back to RD if new problems or questions arise.  **Disclaimer: This note was dictated with voice recognition software. Similar sounding words can inadvertently be transcribed and this note may contain transcription errors which may not have been corrected upon publication of note.**

## 2022-05-26 NOTE — Progress Notes (Signed)
Dr. Julien Nordmann formed that the patient mentioned some chest pain several weeks ago- scheduled cardiology appt. On the 20th of this month. Dr. Julien Nordmann stated to proceed with treatment.

## 2022-05-26 NOTE — Progress Notes (Signed)
[facility] [phone]   [phone]  OFFICE PROGRESS NOTE  Shon Baton, MD [place] [address]  DIAGNOSIS: Extensive stage (T3, N2, M1b) small cell lung cancer presented with obstructive left upper lobe lung mass in addition to mediastinal lymphadenopathy and malignant left pleural effusion as well as pleural-based metastasis as well as suspicious liver metastasis diagnosed in June 2023.  PRIOR THERAPY: None  CURRENT THERAPY: Palliative systemic chemotherapy with carboplatin initiated for AUC of 4 on day 1 and etoposide 80 Mg/M2 on days 1, 2 and 3 with Cosela before the chemotherapy and Imfinzi 1500 Mg IV on day 1 every 3 weeks.  Status post 5 cycles.  Starting from cycle #5 the patient will be on maintenance treatment with Imfinzi 1500 Mg IV every 4 weeks.  INTERVAL HISTORY: Brenan [place]. 81 y.o. male returns to the clinic today for follow-up visit accompanied by his son.  The patient is feeling fine today with no concerning complaints.  He denied having any chest pain, shortness of breath, cough or hemoptysis.  He has no nausea, vomiting, diarrhea or constipation.  He has no headache or visual changes.  He has no recent weight loss or night sweats.  He tolerated the last cycle of his treatment fairly well.  He is here today for evaluation before starting cycle #6 of his treatment.  MEDICAL HISTORY: Past Medical History:  Diagnosis Date   Anosmia    CAD (coronary artery disease), native coronary artery    a. 10/29/2013 antlat STEMI s/p DES to pLAD; residual 80 % proximal and 60% mid LCx diease   Chronic headaches    Dressler's syndrome (HCC)    a. placed on colchicine    ED (erectile dysfunction)    Elevated TSH    Fatty liver    Gout    Ischemic cardiomyopathy    a. 2D ECHO 11/04/14 w/ EF 40-45% with LV WMA, G2DD, mild MR, mild LA dilation, mod TR.   PAF (paroxysmal atrial fibrillation) ([facility])    a. Dx on 11/03/14  admission. Not placed on longterm AC due to DAPT w/ receent DES. Will plan for outpt heart monitor    Pre-diabetes    a. HgA1c 6.1 in 10/2014   Prostate cancer [facility])    s/p prostatectomy and XRT   Rheumatic fever 1947    ALLERGIES:  is allergic to penicillins, clindamycin, corn syrup [glucose], and aspirin adult low [aspirin].  MEDICATIONS:  Current Outpatient Medications  Medication Sig Dispense Refill   acetaminophen (TYLENOL) 500 MG tablet Take 500 mg by mouth every 6 (six) hours as needed for mild pain or moderate pain. (Patient not taking: Reported on 04/28/2022)     ascorbic acid (VITAMIN C) 500 MG tablet Take 1,000 mg by mouth daily.     Blood Pressure Monitor KIT For daily blood pressure monitoring;  Arm Cuff 1 each 0   carvedilol (COREG) 3.125 MG tablet Take 1 tablet (3.125 mg total) by mouth 2 (two) times daily with a meal. 180 tablet 0   Cholecalciferol (VITAMIN D3) 25 MCG (1000 UT) CAPS Take 1 capsule by mouth daily.     escitalopram (LEXAPRO) 5 MG tablet Take 5 mg by mouth daily.     lidocaine-prilocaine (EMLA) cream Apply 1 Application topically as needed. 30 g 2   oxyCODONE (ROXICODONE) 5 MG immediate release tablet Take 0.5-1 tablets (2.5-5 mg total) by mouth every 4 (four) hours as needed. (Patient not taking:  Reported on 04/28/2022) 30 tablet 0   prochlorperazine (COMPAZINE) 10 MG tablet Take 1 tablet (10 mg total) by mouth every 6 (six) hours as needed for nausea or vomiting. (Patient not taking: Reported on 04/28/2022) 30 tablet 0   No current facility-administered medications for this visit.    SURGICAL HISTORY:  Past Surgical History:  Procedure Laterality Date   CORONARY ANGIOPLASTY WITH STENT PLACEMENT  10/30/14   STEMI s/p DES to proximal LAD; residual 80 % proximal and 60% mid LCx diease   IR IMAGING GUIDED PORT INSERTION  03/12/2022   LEFT HEART CATHETERIZATION WITH CORONARY ANGIOGRAM N/A 10/30/2014   Procedure: LEFT HEART CATHETERIZATION WITH CORONARY ANGIOGRAM;   Surgeon: Lorretta Harp, MD;  Location[facility] C[facility] LAB;  Service: Cardiovascular;  Laterality: N/A;   PERCUTANEOUS CORONARY STENT INTERVENTION (PCI-S)  10/30/2014   Procedure: PERCUTANEOUS CORONARY STENT INTERVENTION (PCI-S);  Surgeon: Lorretta Harp, MD;  Location: [facility] CATH LAB;  Service: Cardiovascular;;   PROSTATECTOMY     THORACENTESIS Left 12/31/2021   Procedure: THORACENTESIS;  Surgeon: Candee Furbish, MD;  Location: [facility] ENDOSCOPY;  Service: Pulmonary;  Laterality: Left;   TONSILLECTOMY     VIDEO ASSISTED THORACOSCOPY (VATS)/DECORTICATION Left 01/21/2022   Procedure: VIDEO ASSISTED THORACOSCOPY (VATS)/DECORTICATION;  Surgeon: Lajuana Matte, MD;  Location: [facility] OR;  Service: Thoracic;  Laterality: Left;    REVIEW OF SYSTEMS:  A comprehensive review of systems was negative.   PHYSICAL EXAMINATION: General appearance: alert, cooperative, and no distress Head: Normocephalic, without obvious abnormality, atraumatic Neck: no adenopathy, no JVD, supple, symmetrical, trachea midline, and thyroid not enlarged, symmetric, no tenderness/mass/nodules Lymph nodes: Cervical, supraclavicular, and axillary nodes normal. Resp: clear to auscultation bilaterally Back: symmetric, no curvature. ROM normal. No CVA tenderness. Cardio: regular rate and rhythm, S1, S2 normal, no murmur, click, rub or gallop GI: soft, non-tender; bowel sounds normal; no masses,  no organomegaly Extremities: extremities normal, atraumatic, no cyanosis or edema  ECOG PERFORMANCE STATUS: 1 - Symptomatic but completely ambulatory  There were no vitals taken for this visit.  LABORATORY DATA: Lab Results  Component Value Date   WBC 7.8 05/26/2022   HGB 12.3 (L) 05/26/2022   HCT 37.2 (L) 05/26/2022   MCV 90.1 05/26/2022   PLT 210 05/26/2022      Chemistry      Component Value Date/Time   NA 142 05/06/2022 1031   K 4.2 05/06/2022 1031   CL 108 05/06/2022 1031   CO2 30 05/06/2022 1031   BUN 12 05/06/2022 1031    CREATININE 0.73 05/06/2022 1031      Component Value Date/Time   CALCIUM 8.8 (L) 05/06/2022 1031   ALKPHOS 81 05/06/2022 1031   AST 16 05/06/2022 1031   ALT 7 05/06/2022 1031   BILITOT 0.3 05/06/2022 1031       RADIOGRAPHIC STUDIES: No results found.  ASSESSMENT AND PLAN: This is a very pleasant 81 years old white male with Extensive stage (T3, N2, M1 a) small cell lung cancer presented with obstructive left upper lobe lung mass in addition to mediastinal lymphadenopathy and malignant left pleural effusion as well as pleural-based metastasis and suspicious liver metastasis diagnosed in June 2023. The patient is currently undergoing systemic chemotherapy with carboplatin for AUC of 4 on day 1, etoposide 80 Mg/M2 on days 1, 2 and 3 with Imfinzi 240 Mg/M2 on the days before chemotherapy and Imfinzi 1500 Mg IV on day 1 every 3 weeks.  Status post 5 cycles.  Starting from cycle #5  he will be on maintenance treatment with immunotherapy with Imfinzi 1500 Mg IV every 4 weeks. The patient tolerated cycle #5 of his treatment fairly well with no concerning adverse effects. I recommended for him to proceed with cycle #6 today as planned. I will see him back for follow-up visit in 4 weeks for evaluation with repeat CT scan of the chest, abdomen and pelvis for restaging of his disease. The patient was advised to call immediately if he has any other concerning symptoms in the interval. The patient voices understanding of current disease status and treatment options and is in agreement with the current care plan.  All questions were answered. The patient knows to call the clinic with any problems, questions or concerns. We can certainly see the patient much sooner if necessary.  The total time spent in the appointment was 20 minutes.  Disclaimer: This note was dictated with voice recognition software. Similar sounding words can inadvertently be transcribed and may not be corrected upon review.

## 2022-05-26 NOTE — Patient Instructions (Signed)
[facility]COLOG[facility]  Discharge Instructions: Thank you for choosing [facility] to provide your oncology and hematology care.   If you have a lab appointment with the Cancer Center, please go directly to the Cancer Center and check in at the registration area.   Wear comfortable clothing and clothing appropriate for easy access to any Portacath or PICC line.   We strive to give you quality time with your provider. You may need to reschedule your appointment if you arrive late (15 or more minutes).  Arriving late affects you and other patients whose appointments are after yours.  Also, if you miss three or more appointments without notifying the office, you may be dismissed from the clinic at the provider's discretion.      For prescription refill requests, have your pharmacy contact our office and allow 72 hours for refills to be completed.    Today you received the following chemotherapy and/or immunotherapy agents; Imfinzi      To help prevent nausea and vomiting after your treatment, we encourage you to take your nausea medication as directed.  BELOW ARE SYMPTOMS THAT SHOULD BE REPORTED IMMEDIATELY: *FEVER GREATER THAN 100.4 F (38 C) OR HIGHER *CHILLS OR SWEATING *NAUSEA AND VOMITING THAT IS NOT CONTROLLED WITH YOUR NAUSEA MEDICATION *UNUSUAL SHORTNESS OF BREATH *UNUSUAL BRUISING OR BLEEDING *URINARY PROBLEMS (pain or burning when urinating, or frequent urination) *BOWEL PROBLEMS (unusual diarrhea, constipation, pain near the anus) TENDERNESS IN MOUTH AND THROAT WITH OR WITHOUT PRESENCE OF ULCERS (sore throat, sores in mouth, or a toothache) UNUSUAL RASH, SWELLING OR PAIN  UNUSUAL VAGINAL DISCHARGE OR ITCHING   Items with * indicate a potential emergency and should be followed up as soon as possible or go to the Emergency Department if any problems should occur.  Please show the CHEMOTHERAPY ALERT CARD or IMMUNOTHERAPY ALERT CARD at check-in to  the Emergency Department and triage nurse.  Should you have questions after your visit or need to cancel or reschedule your appointment, please contact [facility]  Dept: [phone]  and follow the prompts.  Office hours are 8:00 a.m. to 4:30 p.m. Monday - Friday. Please note that voicemails left after 4:00 p.m. may not be returned until the following business day.  We are closed weekends and major holidays. You have access to a nurse at all times for urgent questions. Please call the main number to the clinic Dept: [phone] and follow the prompts.   For any non-urgent questions, you may also contact your provider using MyChart. We now offer e-Visits for anyone 18 and older to request care online for non-urgent symptoms. For details visit [email or web address].   Also download the MyChart app! Go to the app store, search "MyChart", open the app, select [facility], and log in with your MyChart username and password.  Masks are optional in the cancer centers. If you would like for your care team to wear a mask while they are taking care of you, please let them know. You may have one support person who is at least 81 years old accompany you for your appointments. 

## 2022-05-27 LAB — T4: T4, Total: 4.8 ug/dL (ref 4.5–12.0)

## 2022-06-02 ENCOUNTER — Encounter: Payer: Self-pay | Admitting: Internal Medicine

## 2022-06-06 ENCOUNTER — Ambulatory Visit: Payer: Medicare Other | Attending: Cardiovascular Disease | Admitting: Cardiovascular Disease

## 2022-06-06 ENCOUNTER — Encounter: Payer: Self-pay | Admitting: Cardiovascular Disease

## 2022-06-06 DIAGNOSIS — I2109 ST elevation (STEMI) myocardial infarction involving other coronary artery of anterior wall: Secondary | ICD-10-CM | POA: Diagnosis not present

## 2022-06-06 DIAGNOSIS — E782 Mixed hyperlipidemia: Secondary | ICD-10-CM

## 2022-06-06 DIAGNOSIS — I480 Paroxysmal atrial fibrillation: Secondary | ICD-10-CM | POA: Insufficient documentation

## 2022-06-06 DIAGNOSIS — I255 Ischemic cardiomyopathy: Secondary | ICD-10-CM | POA: Diagnosis not present

## 2022-06-06 NOTE — Patient Instructions (Signed)
Medication Instructions:  Your physician recommends that you continue on your current medications as directed. Please refer to the Current Medication list given to you today.  *If you need a refill on your cardiac medications before your next appointment, please call your pharmacy*   Follow-Up: At [facility], you and your health needs are our priority.  As part of our continuing mission to provide you with exceptional heart care, we have created designated Provider Care Teams.  These Care Teams include your primary Cardiologist (physician) and Advanced Practice Providers (APPs -  Physician Assistants and Nurse Practitioners) who all work together to provide you with the care you need, when you need it.  We recommend signing up for the patient portal called "MyChart".  Sign up information is provided on this After Visit Summary.  MyChart is used to connect with patients for Virtual Visits (Telemedicine).  Patients are able to view lab/test results, encounter notes, upcoming appointments, etc.  Non-urgent messages can be sent to your provider as well.   To learn more about what you can do with MyChart, go to [email or web address].    Your next appointment:   12 month(s)  The format for your next appointment:   In Person  Provider:   Jonathan Berry, MD   

## 2022-06-06 NOTE — Assessment & Plan Note (Signed)
History of anterior STEMI treated with PCI drug-eluting stenting of the proximal LAD by myself 10/30/2014.  He had left dominant system with moderate proximal AV groove circumflex disease and high-grade nondominant RCA disease.  Myoview performed 01/17/2015 showed scar in the LAD territory.  He denies chest pain or shortness of breath.

## 2022-06-06 NOTE — Assessment & Plan Note (Signed)
History of PAF in the past maintaining sinus rhythm.  He was on Xarelto which has been discontinued in the past.

## 2022-06-06 NOTE — Assessment & Plan Note (Signed)
History of ischemic cardiomyopathy with an EF by recent 2D echo performed 02/25/2022 of 45 to 50% without valvular abnormalities.  He has no symptoms of heart failure

## 2022-06-06 NOTE — Assessment & Plan Note (Signed)
History of hyperlipidemia not on statin therapy with lipid profile performed 10/30/2021 revealing total cholesterol 87, LDL 39 and HDL 39.

## 2022-06-06 NOTE — Progress Notes (Signed)
06/06/2022 Pedro Re Jr.   April 29, 1941  [id number]  Primary Physician Shon Baton, MD Primary Cardiologist: Lorretta Harp MD [name], [place]  HPI:  Pedro Pope. is a 81 y.o.  thin appearing married Caucasian male father of 71, grandfather 7 grandchildren and great grandfather of 71 great grandchildren who is accompanied by one of his sons Pedro Pope today.Marland Kitchen He  is a patient of [name]'s. I last saw him in the office 06/12/2021. 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 He basically had no risk factors prior to his presentation. He had an acute anterolateral infarction.  He had intervention by myself 10/30/14 with a drug-eluting stent in his proximal LAD. He had a left dominant system and had moderate proximal AV groove circumflex disease with high-grade nondominant RCA disease. Ejection fraction was in the 40-45% range with moderate anteroapical hypokinesia. He was discharged home 2 days after admission and was admitted several days later with A. Fib with RVR which she was symptomatic. His EKG showed changes and he has converted to sinus rhythm. Consideration was given to oral anticoagulation over the risks of bleeding were thought to outweigh the benefits. He is wearing an event monitor and has noticed several episodes of brief PAF. His beta blocker was discontinued because of bradycardia and hypotension. Recent Myoview showed dense scar in the LAD territory and 2-D echo revealed an EF of 30-35%. He participated  in cardiac rehabilitation and is relatively asymptomatic. His most recent 2-D echo performed 04/19/15 showed improvement in his LV function up to the 40-45% range. I did send him to see Dr. Rayann Heman for consideration of ICD therapy and to discuss his A. Fib. He was ultimately placed on Xarelto oral anti-coagulation and his Luna Kitchens was transitioned to Plavix to mitigate his bleeding risk.   Since I saw him a year ago he unfortunately has  developed lung cancer stage IV.  He has had chemotherapy and immunotherapy.  He does have a Port-A-Cath in place.  He has had a Pleurx tube placed as well for pleural effusion which was managed by Dr. Kipp Brood.  He is lost close to 20 pounds since I saw him a year ago.  He denies chest pain or shortness of breath.  Current Meds  Medication Sig   acetaminophen (TYLENOL) 500 MG tablet Take 500 mg by mouth every 6 (six) hours as needed for mild pain or moderate pain.   ascorbic acid (VITAMIN C) 500 MG tablet Take 1,000 mg by mouth daily.   Blood Pressure Monitor KIT For daily blood pressure monitoring;  Arm Cuff   carvedilol (COREG) 3.125 MG tablet Take 1 tablet (3.125 mg total) by mouth 2 (two) times daily with a meal.   Cholecalciferol (VITAMIN D3) 25 MCG (1000 UT) CAPS Take 1 capsule by mouth daily.   escitalopram (LEXAPRO) 5 MG tablet Take 5 mg by mouth daily.   lidocaine-prilocaine (EMLA) cream Apply 1 Application topically as needed.   PROCTO-MED HC 2.5 % rectal cream Apply topically.     Allergies  Allergen Reactions   Penicillins Anaphylaxis   Clindamycin Other (See Comments)   Corn Syrup [Glucose] Other (See Comments)    High fructose corn syrup causes gout   Aspirin Adult Low [Aspirin] Itching and Rash    Dr. [name] suspects that long term caused a rash, short term use appears to be ok    Social History   Socioeconomic History   Marital status:  Married    Spouse name: Not on file   Number of children: Not on file   Years of education: Not on file   Highest education level: Not on file  Occupational History   Not on file  Tobacco Use   Smoking status: Former    Types: Cigarettes    Quit date: 08/18/1992    Years since quitting: 29.8   Smokeless tobacco: Never  Vaping Use   Vaping Use: Not on file  Substance and Sexual Activity   Alcohol use: Not Currently    Comment: social   Drug use: No   Sexual activity: Not Currently  Other Topics Concern   Not on file   Social History Narrative   Pt lives in [place] with spouse.  Realtor.  Retired Surveyor, quantity of Radio broadcast assistant Strain: Not on [employer] Insecurity: Not on file  Transportation Needs: Not on file  Physical Activity: Not on file  Stress: Not on file  Social Connections: Not on file  Intimate Partner Violence: Not on file     Review of Systems: General: negative for chills, fever, night sweats or weight changes.  Cardiovascular: negative for chest pain, dyspnea on exertion, edema, orthopnea, palpitations, paroxysmal nocturnal dyspnea or shortness of breath Dermatological: negative for rash Respiratory: negative for cough or wheezing Urologic: negative for hematuria Abdominal: negative for nausea, vomiting, diarrhea, bright red blood per rectum, melena, or hematemesis Neurologic: negative for visual changes, syncope, or dizziness All other systems reviewed and are otherwise negative except as noted above.    Blood pressure (!) 100/42, pulse 77, height 5' 8"  (1.727 m), weight 127 lb 9.6 oz (57.9 kg), SpO2 97 %.  General appearance: alert and no distress Neck: no adenopathy, no carotid bruit, no JVD, supple, symmetrical, trachea midline, and thyroid not enlarged, symmetric, no tenderness/mass/nodules Lungs: clear to auscultation bilaterally Heart: regular rate and rhythm, S1, S2 normal, no murmur, click, rub or gallop Extremities: extremities normal, atraumatic, no cyanosis or edema Pulses: 2+ and symmetric Skin: Skin color, texture, turgor normal. No rashes or lesions Neurologic: Grossly normal  EKG not performed today  ASSESSMENT AND PLAN:   ST elevation myocardial infarction (STEMI) involving other coronary artery of anterior wall - STEMI s/p DES to proximal LAD; residual 80 % proximal and 60% mid LCx diease History of anterior STEMI treated with PCI drug-eluting stenting of the proximal LAD by myself 10/30/2014.  He had left dominant system  with moderate proximal AV groove circumflex disease and high-grade nondominant RCA disease.  Myoview performed 01/17/2015 showed scar in the LAD territory.  He denies chest pain or shortness of breath.  Hyperlipidemia History of hyperlipidemia not on statin therapy with lipid profile performed 10/30/2021 revealing total cholesterol 87, LDL 39 and HDL 39.  Ischemic cardiomyopathy History of ischemic cardiomyopathy with an EF by recent 2D echo performed 02/25/2022 of 45 to 50% without valvular abnormalities.  He has no symptoms of heart failure  PAF post MI History of PAF in the past maintaining sinus rhythm.  He was on Xarelto which has been discontinued in the past.     Lorretta Harp MD [facility], [facility] 06/06/2022 2:53 PM

## 2022-06-10 ENCOUNTER — Encounter: Payer: Self-pay | Admitting: Pulmonary Disease

## 2022-06-10 ENCOUNTER — Encounter: Payer: Self-pay | Admitting: Internal Medicine

## 2022-06-11 NOTE — Telephone Encounter (Signed)
Please see reply from pt's son:  Resa Miner Kapler (proxy for [name].)  P [facility] (supporting Lanier Clam, MD) 2 hours ago (12:01 PM)   CE Hello - Thanks for the quick reply. Dad wants to switch to a different doctor.  As for why, we feel that during some of the critical stages, follow-through and response time were lacking.     For this visit, I feel he needs to be evaluated for his oxygen needs. Since his pleurx catheter was removed he feels he does not need the oxygen, but I think that needs to be a doctor's call.    Thanks, [name]

## 2022-06-11 NOTE — Telephone Encounter (Signed)
Sure - ok for switch. Would ask if there is something I could do differently in the future if he is unsatisfied. Also, would be helpful to know what second opinion is for so Dr. Erin Fulling or Chase Caller can prepare.

## 2022-06-11 NOTE — Telephone Encounter (Signed)
Happy to see him.  I am in the process of opening up some extra clinic in November/December but otherwise it would be January.  I have more expertise with pulmonary fibrosis.  If the Pleurx is a bigger issue than Dr. Erin Fulling might be a better fit but for sure I can help coordinate that part of the care

## 2022-06-13 ENCOUNTER — Other Ambulatory Visit: Payer: Self-pay

## 2022-06-13 NOTE — Telephone Encounter (Signed)
12/25/21 First evaluation in clinic for pneumonia  s/p 2 rounds abx and recognized either parapnuemonic effusion or malignant effusion and offered chest tube (plavix therapy) vs thoracentesis, after shared decision making, thoracentesis was arranged  12/31/21 Thoracentesis performed after holding plavix for 5 days  01/03/22 received message about chest pain, reviewed post thoracentesis chest xray, concerning for worsening airspace disease after antibiotics raising suspicion for malignancy, 01/03/22 called patient and this was discussed as well as treatment for chest pain and additional course of abx prescribed and explained need for expedited repeat CT, see note for full details  CT scan scheduled for 01/29/22 was rescheduled for 01/15/22  01/14/22 Telephone encounter reporting ongoing chest pain, this was not routed to me or any provider  01/17/22 the CT scan results are called to clinic  01/17/22 The provider of the day schedules appointment same day in clinic, patient is subsequently sent to ED after clinic evaluation  That hospitalization he was diagnosed with advanced stage lung cancer and Pleurx placed. Discharged 01/23/22. I arranged short term follow up with me.  01/31/22 Follow up with me, expectant management of Pleurx discussed, recommended 3 month follow up, this was not scheduled  Dr. Erin Fulling and Dr. Chase Caller, this timeline is a brief summary of events. I hope this summary aids you in ongoing care. Patient wishes to switch due to "we feel that during some of the critical stages, follow-through and response time were lacking."

## 2022-06-16 NOTE — Telephone Encounter (Signed)
MR, your soonest availability is on 08/25/22. Please advise. Thanks.

## 2022-06-16 NOTE — Telephone Encounter (Signed)
Front desk  HAppy to see patient. When can you get him in ?  Thanks  MR

## 2022-06-18 ENCOUNTER — Ambulatory Visit: Payer: Medicare Other | Admitting: Cardiovascular Disease

## 2022-06-19 ENCOUNTER — Ambulatory Visit (HOSPITAL_COMMUNITY)
Admission: RE | Admit: 2022-06-19 | Discharge: 2022-06-19 | Disposition: A | Discharge status: Home / Self Care | Payer: Medicare Other | Source: Ambulatory Visit | Attending: Internal Medicine | Admitting: Internal Medicine

## 2022-06-19 DIAGNOSIS — J439 Emphysema, unspecified: Secondary | ICD-10-CM | POA: Diagnosis not present

## 2022-06-19 DIAGNOSIS — N200 Calculus of kidney: Secondary | ICD-10-CM | POA: Diagnosis not present

## 2022-06-19 DIAGNOSIS — C3490 Malignant neoplasm of unspecified part of unspecified bronchus or lung: Secondary | ICD-10-CM | POA: Diagnosis not present

## 2022-06-19 DIAGNOSIS — J479 Bronchiectasis, uncomplicated: Secondary | ICD-10-CM | POA: Diagnosis not present

## 2022-06-19 DIAGNOSIS — K8020 Calculus of gallbladder without cholecystitis without obstruction: Secondary | ICD-10-CM | POA: Diagnosis not present

## 2022-06-19 DIAGNOSIS — J90 Pleural effusion, not elsewhere classified: Secondary | ICD-10-CM | POA: Diagnosis not present

## 2022-06-19 MED ORDER — IOHEXOL 300 MG/ML  SOLN
100.0000 mL | Freq: Once | INTRAMUSCULAR | Status: AC | PRN
  Administered 2022-06-19: 100 mL via INTRAVENOUS

## 2022-06-19 MED ORDER — SODIUM CHLORIDE (PF) 0.9 % IJ SOLN
INTRAMUSCULAR | Status: AC
  Filled 2022-06-19: qty 50

## 2022-06-19 MED ORDER — IOHEXOL 300 MG/ML  SOLN: 100.0000 mL | Freq: Once | INTRAMUSCULAR | Status: DC | PRN

## 2022-06-19 MED ORDER — HEPARIN SOD (PORK) LOCK FLUSH 100 UNIT/ML IV SOLN
INTRAVENOUS | Status: AC
  Administered 2022-06-19: 500 [IU]
  Filled 2022-06-19: qty 5

## 2022-06-19 MED ORDER — HEPARIN SOD (PORK) LOCK FLUSH 100 UNIT/ML IV SOLN: 500.0000 [IU] | Freq: Once | INTRAVENOUS | Status: DC

## 2022-06-20 NOTE — Progress Notes (Unsigned)
[facility] OFFICE PROGRESS NOTE  Shon Baton, MD [place] [address]  DIAGNOSIS: Extensive stage (T3, N2, M1 a) small cell lung cancer presented with obstructive left upper lobe lung mass in addition to mediastinal lymphadenopathy and malignant left pleural effusion as well as pleural-based metastasis as well as suspicious liver metastasis diagnosed in June 2023.   PRIOR THERAPY: None  CURRENT THERAPY:  Palliative systemic chemotherapy with carboplatin initiated for AUC of 4 on day 1 and etoposide 80 Mg/M2 on days 1, 2 and 3 with Cosela before the chemotherapy and Imfinzi 1500 Mg IV on day 1 every 3 weeks.  Status post 6 cycles.   Status post 5 cycles.  Starting from cycle #5 the patient will be on maintenance treatment with Imfinzi 1500 Mg IV every 4 weeks.   INTERVAL HISTORY: Pedro Pope. 81 y.o. male returns to the clinic today for a follow-up visit accompanied by his son.  The patient is feeling fairly well today without any complaints except for a rash, headaches, and previous chest pain.  He is currently undergoing treatment with immunotherapy with Imfinzi and is tolerating it well.  He denies any fever, chills, night sweats, or weight loss today.  Denies any current chest pain, shortness of breath beyond his baseline, cough other than occasionally, or hemoptysis.  Denies any nausea, vomiting, diarrhea, or constipation.   Reports a headache that occurs suddenly, multiple times a day, nearly every day for the past 2-3 weeks. Denies any visual changes.   Reports a rash on his hands, arms, and on the remained of his body that itches "sometimes" it appeared about 2-3 weeks ago.  Reports recent chest pain, he describes the pain as "sharp." This occurred the day after he saw his cardiologist. Advised to be aware of cardiac pain and notify his cardiologist if it occurs again. He is not having any pain at this time.  He recently had a restaging CT scan  performed.  He is here today for evaluation and to review his scan results before starting cycle #7.  MEDICAL HISTORY: Past Medical History:  Diagnosis Date   Anosmia    CAD (coronary artery disease), native coronary artery    a. 10/29/2013 antlat STEMI s/p DES to pLAD; residual 80 % proximal and 60% mid LCx diease   Chronic headaches    Dressler's syndrome (HCC)    a. placed on colchicine    ED (erectile dysfunction)    Elevated TSH    Fatty liver    Gout    Ischemic cardiomyopathy    a. 2D ECHO 11/04/14 w/ EF 40-45% with LV WMA, G2DD, mild MR, mild LA dilation, mod TR.   PAF (paroxysmal atrial fibrillation) ([facility])    a. Dx on 11/03/14 admission. Not placed on longterm AC due to DAPT w/ receent DES. Will plan for outpt heart monitor    Pre-diabetes    a. HgA1c 6.1 in 10/2014   Prostate cancer [facility])    s/p prostatectomy and XRT   Rheumatic fever 1947    ALLERGIES:  is allergic to penicillins, clindamycin, corn syrup [glucose], and aspirin adult low [aspirin].  MEDICATIONS:  Current Outpatient Medications  Medication Sig Dispense Refill   acetaminophen (TYLENOL) 500 MG tablet Take 500 mg by mouth every 6 (six) hours as needed for mild pain or moderate pain.     ascorbic acid (VITAMIN C) 500 MG tablet Take 1,000 mg by mouth daily.     Blood Pressure Monitor  KIT For daily blood pressure monitoring;  Arm Cuff 1 each 0   carvedilol (COREG) 3.125 MG tablet Take 1 tablet (3.125 mg total) by mouth 2 (two) times daily with a meal. 180 tablet 0   Cholecalciferol (VITAMIN D3) 25 MCG (1000 UT) CAPS Take 1 capsule by mouth daily.     escitalopram (LEXAPRO) 5 MG tablet Take 5 mg by mouth daily.     lidocaine-prilocaine (EMLA) cream Apply 1 Application topically as needed. 30 g 2   oxyCODONE (ROXICODONE) 5 MG immediate release tablet Take 0.5-1 tablets (2.5-5 mg total) by mouth every 4 (four) hours as needed. (Patient not taking: Reported on 04/28/2022) 30 tablet 0   prochlorperazine (COMPAZINE)  10 MG tablet Take 1 tablet (10 mg total) by mouth every 6 (six) hours as needed for nausea or vomiting. (Patient not taking: Reported on 04/28/2022) 30 tablet 0   PROCTO-MED HC 2.5 % rectal cream Apply topically.     No current facility-administered medications for this visit.    SURGICAL HISTORY:  Past Surgical History:  Procedure Laterality Date   CORONARY ANGIOPLASTY WITH STENT PLACEMENT  10/30/14   STEMI s/p DES to proximal LAD; residual 80 % proximal and 60% mid LCx diease   IR IMAGING GUIDED PORT INSERTION  03/12/2022   LEFT HEART CATHETERIZATION WITH CORONARY ANGIOGRAM N/A 10/30/2014   Procedure: LEFT HEART CATHETERIZATION WITH CORONARY ANGIOGRAM;  Surgeon: Lorretta Harp, MD;  Location: [facility] CATH LAB;  Service: Cardiovascular;  Laterality: N/A;   PERCUTANEOUS CORONARY STENT INTERVENTION (PCI-S)  10/30/2014   Procedure: PERCUTANEOUS CORONARY STENT INTERVENTION (PCI-S);  Surgeon: Lorretta Harp, MD;  Location: [facility] CATH LAB;  Service: Cardiovascular;;   PROSTATECTOMY     THORACENTESIS Left 12/31/2021   Procedure: THORACENTESIS;  Surgeon: Candee Furbish, MD;  Location: [facility] ENDOSCOPY;  Service: Pulmonary;  Laterality: Left;   TONSILLECTOMY     VIDEO ASSISTED THORACOSCOPY (VATS)/DECORTICATION Left 01/21/2022   Procedure: VIDEO ASSISTED THORACOSCOPY (VATS)/DECORTICATION;  Surgeon: Lajuana Matte, MD;  Location: [facility] OR;  Service: Thoracic;  Laterality: Left;    REVIEW OF SYSTEMS:   Review of Systems  Constitutional: Negative for appetite change, chills, fatigue, fever and unexpected weight change.  HENT:   Negative for mouth sores, nosebleeds, sore throat and trouble swallowing.   Eyes: Negative for eye problems and icterus.  Respiratory: Positive for baseline dyspnea on exertion. Negative for cough, hemoptysis, and wheezing.   Cardiovascular: Negative for chest pain and leg swelling.  Gastrointestinal: Negative for abdominal pain, constipation, diarrhea, nausea and vomiting.   Genitourinary: Negative for bladder incontinence, difficulty urinating, dysuria, frequency and hematuria.   Musculoskeletal: Negative for back pain, gait problem, neck pain and neck stiffness.  Skin: Positive rash and itching.  Neurological: Positive for headache. Negative for dizziness, extremity weakness, gait problem, light-headedness and seizures.  Hematological: Negative for adenopathy. Does not bruise/bleed easily.  Psychiatric/Behavioral: Negative for confusion, depression and sleep disturbance. The patient is not nervous/anxious.     PHYSICAL EXAMINATION:  There were no vitals taken for this visit.  ECOG PERFORMANCE STATUS: 2  Physical Exam  Constitutional: Oriented to person, place, and time and thin appearing male, and in no distress.  HENT:  Head: Normocephalic and atraumatic.  Mouth/Throat: Oropharynx is clear and moist. No oropharyngeal exudate.  Eyes: Conjunctivae are normal. Right eye exhibits no discharge. Left eye exhibits no discharge. No scleral icterus.  Neck: Normal range of motion. Neck supple.  Cardiovascular: Normal rate, regular rhythm, normal heart sounds and intact distal  pulses.   Pulmonary/Chest: Effort normal. Decreased breath sounds in base of the left lung. No respiratory distress. No wheezes. No rales.  Abdominal:  Exhibits no distension  Musculoskeletal: Normal range of motion. Exhibits no edema.  Lymphadenopathy:    No cervical adenopathy.  Neurological: Alert and oriented to person, place, and time. Exhibits normal muscle tone. Coordination normal. In wheelchair  Skin: Skin is warm and dry.  rash noted. Not diaphoretic. No erythema. No pallor.  Psychiatric: Mood, memory and judgment normal.  Vitals reviewed.  LABORATORY DATA: Lab Results  Component Value Date   WBC 7.8 05/26/2022   HGB 12.3 (L) 05/26/2022   HCT 37.2 (L) 05/26/2022   MCV 90.1 05/26/2022   PLT 210 05/26/2022      Chemistry      Component Value Date/Time   NA 137  05/26/2022 0752   K 3.9 05/26/2022 0752   CL 104 05/26/2022 0752   CO2 27 05/26/2022 0752   BUN 16 05/26/2022 0752   CREATININE 0.86 05/26/2022 0752      Component Value Date/Time   CALCIUM 9.1 05/26/2022 0752   ALKPHOS 85 05/26/2022 0752   AST 16 05/26/2022 0752   ALT 7 05/26/2022 0752   BILITOT 0.4 05/26/2022 0752       RADIOGRAPHIC STUDIES:  No results found.   ASSESSMENT/PLAN:  This is a very pleasant 81 year old Caucasian male diagnosed with extensive stage (T3, N2, M1 a) small cell lung cancer.  He presented with an obstructive left upper lobe lung mass in addition to mediastinal lymphadenopathy.  He also has a malignant left pleural effusion and pleural-based metastases.  He also has a suspicious liver metastasis.  He was diagnosed in June 2023.   The patient is currently undergoing slightly reduced dose systemic chemotherapy with carboplatin for an AUC of 4 on day 1, etoposide 80 mg per metered squared on days 1 2, and 3 with Imfinzi on day 1 every 3 weeks.  He is status post 6 cycles.  Starting from cycle #5, the patient started maintenance immunotherapy with Imfinzi IV every 4 weeks.  The patient recently had a restaging CT scan performed.  Dr. Julien Nordmann personally independently reviewed the scan and discussed results with the patient today.  The scan showed no progression.  Recommend he continue on the same treatment at the same dose.  He will proceed with cycle number 7 today as scheduled  We will order an MRI of the brain to check for metastasis given the recent headaches. I will call with results.   See him back for follow-up visit in 4 weeks for evaluation repeat blood work before starting cycle #8.  For his rash, advised to use hydrocortisone cream and anti-histamines. If no improvement, advised to call for further recommendations.   He knows if he develops s/sx of MI to seek emergently evaluation. He will follow with his cardiologist if he gets recurrent symptoms.    The patient was advised to call immediately if he has any concerning symptoms in the interval. The patient voices understanding of current disease status and treatment options and is in agreement with the current care plan. All questions were answered. The patient knows to call the clinic with any problems, questions or concerns. We can certainly see the patient much sooner if necessary   No orders of the defined types were placed in this encounter.     Latecia Miler L Demiana Crumbley, PA-C 06/20/22  ADDENDUM: Hematology/Oncology Attending: I had a face-to-face encounter with the patient today.  I reviewed his record, lab, scan and recommended his care plan.  This is a very pleasant 81 years old white male with extensive stage small cell lung cancer diagnosed in June 2023 status post induction systemic chemotherapy with carboplatin, etoposide and Imfinzi for 4 cycles and he is currently on maintenance treatment with single agent Imfinzi for 2 more cycles. The patient has been tolerating this treatment well with no concerning adverse effects but he has some intermittent headaches recently. He had repeat CT scan of the chest, abdomen and pelvis performed recently.  I personally and independently reviewed the scan and discussed the result with the patient and his son. His scan showed no concerning findings for disease progression. I recommended for the patient to continue his current maintenance therapy and he will proceed with cycle #7 today. For the intermittent headache, I will arrange for the patient to have repeat MRI of the brain to rule out brain metastasis. For the skin rash, the patient will apply hydrocortisone cream and use antihistamines for the pruritus. He will come back for follow-up visit in 4 weeks for evaluation before the next cycle of his treatment. He was advised to call immediately if he has any other concerning symptoms in the interval. The total time spent in the appointment  was 30 minutes. Disclaimer: This note was dictated with voice recognition software. Similar sounding words can inadvertently be transcribed and may be missed upon review. Eilleen Kempf, MD

## 2022-06-23 ENCOUNTER — Inpatient Hospital Stay: Payer: Medicare Other | Attending: Internal Medicine

## 2022-06-23 ENCOUNTER — Other Ambulatory Visit: Payer: Self-pay

## 2022-06-23 ENCOUNTER — Inpatient Hospital Stay (HOSPITAL_BASED_OUTPATIENT_CLINIC_OR_DEPARTMENT_OTHER): Payer: Medicare Other | Admitting: Physician Assistant

## 2022-06-23 ENCOUNTER — Inpatient Hospital Stay: Payer: Medicare Other

## 2022-06-23 VITALS — BP 107/49 | HR 57 | Temp 97.6°F | Resp 14 | Wt 132.3 lb

## 2022-06-23 DIAGNOSIS — C3482 Malignant neoplasm of overlapping sites of left bronchus and lung: Secondary | ICD-10-CM

## 2022-06-23 DIAGNOSIS — Z5112 Encounter for antineoplastic immunotherapy: Secondary | ICD-10-CM | POA: Insufficient documentation

## 2022-06-23 DIAGNOSIS — J910 Malignant pleural effusion: Secondary | ICD-10-CM | POA: Insufficient documentation

## 2022-06-23 DIAGNOSIS — C787 Secondary malignant neoplasm of liver and intrahepatic bile duct: Secondary | ICD-10-CM | POA: Insufficient documentation

## 2022-06-23 DIAGNOSIS — C3490 Malignant neoplasm of unspecified part of unspecified bronchus or lung: Secondary | ICD-10-CM

## 2022-06-23 DIAGNOSIS — R21 Rash and other nonspecific skin eruption: Secondary | ICD-10-CM | POA: Insufficient documentation

## 2022-06-23 DIAGNOSIS — R519 Headache, unspecified: Secondary | ICD-10-CM

## 2022-06-23 DIAGNOSIS — Z95828 Presence of other vascular implants and grafts: Secondary | ICD-10-CM

## 2022-06-23 LAB — CBC WITH DIFFERENTIAL (CANCER CENTER ONLY)
Abs Immature Granulocytes: 0.02 10*3/uL (ref 0.00–0.07)
Basophils Absolute: 0 10*3/uL (ref 0.0–0.1)
Basophils Relative: 1 %
Eosinophils Absolute: 0.4 10*3/uL (ref 0.0–0.5)
Eosinophils Relative: 6 %
HCT: 36.1 % — ABNORMAL LOW (ref 39.0–52.0)
Hemoglobin: 11.6 g/dL — ABNORMAL LOW (ref 13.0–17.0)
Immature Granulocytes: 0 %
Lymphocytes Relative: 28 %
Lymphs Abs: 1.9 10*3/uL (ref 0.7–4.0)
MCH: 29.3 pg (ref 26.0–34.0)
MCHC: 32.1 g/dL (ref 30.0–36.0)
MCV: 91.2 fL (ref 80.0–100.0)
Monocytes Absolute: 0.7 10*3/uL (ref 0.1–1.0)
Monocytes Relative: 11 %
Neutro Abs: 3.5 10*3/uL (ref 1.7–7.7)
Neutrophils Relative %: 54 %
Platelet Count: 184 10*3/uL (ref 150–400)
RBC: 3.96 MIL/uL — ABNORMAL LOW (ref 4.22–5.81)
RDW: 14.1 % (ref 11.5–15.5)
WBC Count: 6.5 10*3/uL (ref 4.0–10.5)
nRBC: 0 % (ref 0.0–0.2)

## 2022-06-23 LAB — CMP (CANCER CENTER ONLY)
ALT: 7 U/L (ref 0–44)
AST: 17 U/L (ref 15–41)
Albumin: 3.4 g/dL — ABNORMAL LOW (ref 3.5–5.0)
Alkaline Phosphatase: 71 U/L (ref 38–126)
Anion gap: 5 (ref 5–15)
BUN: 19 mg/dL (ref 8–23)
CO2: 29 mmol/L (ref 22–32)
Calcium: 8.8 mg/dL — ABNORMAL LOW (ref 8.9–10.3)
Chloride: 108 mmol/L (ref 98–111)
Creatinine: 0.79 mg/dL (ref 0.61–1.24)
GFR, Estimated: >60 mL/min (ref >60)
Glucose, Bld: 124 mg/dL — ABNORMAL HIGH (ref 70–99)
Potassium: 3.9 mmol/L (ref 3.5–5.1)
Sodium: 142 mmol/L (ref 135–145)
Total Bilirubin: 0.3 mg/dL (ref 0.3–1.2)
Total Protein: 5.8 g/dL — ABNORMAL LOW (ref 6.5–8.1)

## 2022-06-23 MED ORDER — SODIUM CHLORIDE 0.9% FLUSH
10.0000 mL | Freq: Once | INTRAVENOUS | Status: AC
  Administered 2022-06-23: 10 mL

## 2022-06-23 MED ORDER — SODIUM CHLORIDE 0.9% FLUSH
10.0000 mL | INTRAVENOUS | Status: DC | PRN
  Administered 2022-06-23: 10 mL

## 2022-06-23 MED ORDER — HEPARIN SOD (PORK) LOCK FLUSH 100 UNIT/ML IV SOLN: 250.0000 [IU] | Freq: Once | INTRAVENOUS | Status: DC

## 2022-06-23 MED ORDER — SODIUM CHLORIDE 0.9 % IV SOLN: Freq: Once | INTRAVENOUS | Status: AC

## 2022-06-23 MED ORDER — SODIUM CHLORIDE 0.9 % IV SOLN
1500.0000 mg | Freq: Once | INTRAVENOUS | Status: AC
  Administered 2022-06-23: 1500 mg via INTRAVENOUS
  Filled 2022-06-23: qty 30

## 2022-06-23 MED ORDER — HEPARIN SOD (PORK) LOCK FLUSH 100 UNIT/ML IV SOLN
500.0000 [IU] | Freq: Once | INTRAVENOUS | Status: AC | PRN
  Administered 2022-06-23: 500 [IU]

## 2022-06-23 NOTE — Telephone Encounter (Signed)
I am opening2-5 clinics for this month and dec - TBC this week. Can you tell front desk to hold to his name and he should be first. Or let him know and send back to me and as soon as opern I will let front desk know

## 2022-06-23 NOTE — Patient Instructions (Signed)
[facility]  Discharge Instructions: Thank you for choosing [facility] to provide your oncology and hematology care.   If you have a lab appointment with the [facility], please go directly to the [facility] and check in at the registration area.   Wear comfortable clothing and clothing appropriate for easy access to any Portacath or PICC line.   We strive to give you quality time with your provider. You may need to reschedule your appointment if you arrive late (15 or more minutes).  Arriving late affects you and other patients whose appointments are after yours.  Also, if you miss three or more appointments without notifying the office, you may be dismissed from the clinic at the provider's discretion.      For prescription refill requests, have your pharmacy contact our office and allow 72 hours for refills to be completed.    Today you received the following chemotherapy and/or immunotherapy agents imfinzi       To help prevent nausea and vomiting after your treatment, we encourage you to take your nausea medication as directed.  BELOW ARE SYMPTOMS THAT SHOULD BE REPORTED IMMEDIATELY: *FEVER GREATER THAN 100.4 F (38 C) OR HIGHER *CHILLS OR SWEATING *NAUSEA AND VOMITING THAT IS NOT CONTROLLED WITH YOUR NAUSEA MEDICATION *UNUSUAL SHORTNESS OF BREATH *UNUSUAL BRUISING OR BLEEDING *URINARY PROBLEMS (pain or burning when urinating, or frequent urination) *BOWEL PROBLEMS (unusual diarrhea, constipation, pain near the anus) TENDERNESS IN MOUTH AND THROAT WITH OR WITHOUT PRESENCE OF ULCERS (sore throat, sores in mouth, or a toothache) UNUSUAL RASH, SWELLING OR PAIN  UNUSUAL VAGINAL DISCHARGE OR ITCHING   Items with * indicate a potential emergency and should be followed up as soon as possible or go to the Emergency Department if any problems should occur.  Please show the CHEMOTHERAPY ALERT CARD or IMMUNOTHERAPY ALERT CARD at check-in to  the Emergency Department and triage nurse.  Should you have questions after your visit or need to cancel or reschedule your appointment, please contact [facility]  Dept: [phone]  and follow the prompts.  Office hours are 8:00 a.m. to 4:30 p.m. Monday - Friday. Please note that voicemails left after 4:00 p.m. may not be returned until the following business day.  We are closed weekends and major holidays. You have access to a nurse at all times for urgent questions. Please call the main number to the clinic Dept: [phone] and follow the prompts.   For any non-urgent questions, you may also contact your provider using MyChart. We now offer e-Visits for anyone 1 and older to request care online for non-urgent symptoms. For details visit [email or web address].   Also download the MyChart app! Go to the app store, search "MyChart", open the app, select [facility], and log in with your MyChart username and password.  Masks are optional in the cancer centers. If you would like for your care team to wear a mask while they are taking care of you, please let them know. You may have one support person who is at least 81 years old accompany you for your appointments.

## 2022-06-24 ENCOUNTER — Other Ambulatory Visit: Payer: Self-pay

## 2022-06-30 ENCOUNTER — Ambulatory Visit: Payer: Medicare Other

## 2022-06-30 NOTE — Progress Notes (Signed)
Nutrition Follow-up:  Patient with extensive stage small cell lung cancer.    Spoke with son via phone for nutrition follow-up.  Son reports that patient needs to eat more protein foods.  Drinking premier protein shakes but feels like these could be increased.  Overall appetite is doing fairly well.  Enjoys ice cream does not really like peanut butter or eggs.     Medications: reviewed  Labs: reviewed  Anthropometrics:   Weight 132 lb on 11/6  127 lb 10/9   NUTRITION DIAGNOSIS: Severe malnutrition, improving with weight gain   INTERVENTION:  Discussed foods high in protein with son and encouraged protein food at every meal/snack. Increase premier protein shakes as able.    MONITORING, EVALUATION, GOAL: weight trends, intake   NEXT VISIT: Monday, Dec 4 during infusion with Barb  Annaly Skop B. Zenia Resides, [place], [place] Registered Dietitian [phone]

## 2022-07-01 ENCOUNTER — Ambulatory Visit (HOSPITAL_COMMUNITY)
Admission: RE | Admit: 2022-07-01 | Discharge: 2022-07-01 | Disposition: A | Discharge status: Home / Self Care | Payer: Medicare Other | Source: Ambulatory Visit | Attending: Physician Assistant | Admitting: Physician Assistant

## 2022-07-01 DIAGNOSIS — C3482 Malignant neoplasm of overlapping sites of left bronchus and lung: Secondary | ICD-10-CM | POA: Insufficient documentation

## 2022-07-01 DIAGNOSIS — R519 Headache, unspecified: Secondary | ICD-10-CM | POA: Diagnosis not present

## 2022-07-01 MED ORDER — GADOBUTROL 1 MMOL/ML IV SOLN
6.0000 mL | Freq: Once | INTRAVENOUS | Status: AC | PRN
  Administered 2022-07-01: 6 mL via INTRAVENOUS

## 2022-07-02 ENCOUNTER — Other Ambulatory Visit: Payer: Self-pay

## 2022-07-02 ENCOUNTER — Telehealth: Payer: Self-pay

## 2022-07-02 NOTE — Telephone Encounter (Signed)
This nurse reached out to patient and spoke with his son [name], made him aware that the MRI of the brain showed no metastatic disease.  He acknowledged understanding and agreed to inform the patient.  No further questions or concerns noted at this time.

## 2022-07-03 NOTE — Telephone Encounter (Signed)
Spoke with Harrell Gave and informed him of new openings on 11/20. He stated that he and the pt would be out of town and wanted to [facility] appt for 11/30.

## 2022-07-03 NOTE — Telephone Encounter (Signed)
I am adding clinic 07/07/22  AM  (will be confirmed today) but if that does not work for patient then add with Dr Erin Fulling

## 2022-07-04 NOTE — Telephone Encounter (Signed)
Sounds good

## 2022-07-15 ENCOUNTER — Other Ambulatory Visit: Payer: Self-pay

## 2022-07-17 ENCOUNTER — Encounter: Payer: Self-pay | Admitting: Internal Medicine

## 2022-07-17 ENCOUNTER — Ambulatory Visit (INDEPENDENT_AMBULATORY_CARE_PROVIDER_SITE_OTHER): Payer: Medicare Other | Admitting: Internal Medicine

## 2022-07-17 VITALS — BP 114/64 | HR 63 | Ht 68.0 in | Wt 136.2 lb

## 2022-07-17 DIAGNOSIS — J8410 Pulmonary fibrosis, unspecified: Secondary | ICD-10-CM | POA: Diagnosis not present

## 2022-07-17 DIAGNOSIS — J849 Interstitial pulmonary disease, unspecified: Secondary | ICD-10-CM | POA: Diagnosis not present

## 2022-07-17 DIAGNOSIS — J84112 Idiopathic pulmonary fibrosis: Secondary | ICD-10-CM | POA: Diagnosis not present

## 2022-07-17 DIAGNOSIS — I255 Ischemic cardiomyopathy: Secondary | ICD-10-CM | POA: Diagnosis not present

## 2022-07-17 DIAGNOSIS — J439 Emphysema, unspecified: Secondary | ICD-10-CM | POA: Diagnosis not present

## 2022-07-17 MED ORDER — SPIRIVA RESPIMAT 2.5 MCG/ACT IN AERS: 2.0000 | INHALATION_SPRAY | Freq: Every day | RESPIRATORY_TRACT | 5 refills | Status: DC

## 2022-07-17 NOTE — Patient Instructions (Addendum)
ICD-10-CM   1. IPF (idiopathic pulmonary fibrosis) ([facility])  J84.112     2. ILD (interstitial lung disease) ([facility])  J84.9     3. Pulmonary emphysema, unspecified emphysema type ([place])  J43.9     4. Combined pulmonary fibrosis and emphysema (CPFE) (HCC)  J43.9    J84.10       # Pulmonary fibrosis - I believe you have IPF [although you have a history of a non-] based on the fact age greater than 26, Caucasian ethnicity, classic features on CT scan and progression in the last 3-1/2 years.  And previous smoking history  Plan - Check ANA, rheumatoid factor, CCP, QuantiFERON gold -Start pirfenidone low-dose protocol [2 pills 3 times daily max]  -Start with 1 pill 3 times daily with food for 1 week and then escalate to 2 pills 3 times daily and stay on that dose  -Take 5-6 hours apart  -Apply sunscreen when you go out  -Drink plenty of water  #emphysema -Do alpha-1 antitrypsin blood work -Start Spiriva Respimat high-dose 2 puffs once daily  # Combined emphysema and pulmonary fibrosis -  - Do full pulmonary function test at some point in the next few months -Check overnight oxygen at home pulse oximetry -Do simple walking desaturation test  -If these are normal you can return the oxygen system   - you can return portable o2 based on walk test 07/17/2022   #Followup  - 6-8 weeks with nurse practitioner to review progress or he can see me if I have a opening available -12 weeks with Dr. Chase Caller 30-minute visit  -7 score and walking desaturation test at follow-up

## 2022-07-17 NOTE — Progress Notes (Signed)
OV 07/17/2022 -transfer of care to the ILD center wit Dr. Chase Caller.  Previous patient of Dr. Silas Flood  Subjective:  Patient ID: Pedro Needle., male , DOB: Sep 04, 1940 , age 81 y.o. , MRN: [id number] , ADDRESS: [place] [id number] PCP Shon Baton, MD Patient Care Team: Shon Baton, MD as PCP - General (Internal Medicine) Lorretta Harp, MD as PCP - Cardiology (Cardiology) Hunsucker, Bonna Gains, MD as Consulting Physician (Pulmonary Disease)  This Provider for this visit: Treatment Team:  Attending Provider: Brand Males, MD    07/17/2022 -   Chief Complaint  Patient presents with   Follow-up    Pt states he has been doing okay since last visit.     HPI Pedro Pope. 81 y.o. -presents with his wife and son [name].  He is originally from [place].  He is still in [employer] but also [place] he is retired he lives in [place] to be near his son.  History is gained from review of the chart but also talking to him and his wife.  He is a former smoker.  He has a previous history of prostate cancer.  In the summer of 2023 had malignant pleural effusion on the left side from lung cancer and was status post Pleurx which was removed in the fall 2023.  He has been undergoing immunotherapy with Dr. Julien Nordmann.  He is tolerating it well.  The cancer is improved.  He does have some skin lesions on his right forearm.  He wants this gone.  We have advised him to talk to Dr. Julien Nordmann about it.  During the initial diagnose of cancer in the workup he was on 4 L of oxygen but currently he does not feel he needs it and does not using it.  His wife and son feel that physician needs to ascertain if he needs it.  He had failure to thrive and lost a lot of weight from baseline 145 pounds 210 pounds.  He is currently regained much of it and is at 135 pounds approximately.  Overall he feels good he is doing his activities of daily living.  He is able to do  stuff around the house but when he goes to [employer] with the son he gets quite short of breath.  Current walking desaturation test is normal without significant desaturation although he did drop 3 points   Review of his CT from 2 and half years ago CT abdomen lung image when he had hematuria showed some ILD in the lung bases.  There is no clear-cut honeycombing in my opinion but was subpleural bilateral bibasal with reticulation.  Current CT scan of the chest shows classic UIP features in my view.  There is also associated emphysema.  ILD risk factors/IPF risk factors -Former smoker -Age greater than 18 and Caucasian ethnicity -Progression with classic UIP -He gives a history of Raynaud's but no autoimmune disease -No serology available -Ongoing immunotherapy for lung cancer -Associated emphysema   Simple office walk 185 feet x  3 laps goal with forehead probe 07/17/2022    O2 used ra   Number laps completed 3   Comments about pace avg   Resting Pulse Ox/HR 100% and 59/min   Final Pulse Ox/HR 97% and 88/min   Desaturated </= 88% no   Desaturated <= 3% points yes   Got Tachycardic >/= 90/min no   Symptoms at end of test Mild dyspnea    Miscellaneous  comments x       CT Chest data - HRCT 06/19/22    IMPRESSION: 1. Diminished left pleural effusion, with a trace residual and some associated pleural thickening, consistent with treated pleural metastatic disease. 2. Hypodense lesion of the posterior liver dome, hepatic segment VII is diminished in size, measuring no greater than 0.4 cm, consistent with treatment response of a small hepatic metastasis. 3. Status post prostatectomy. Small focus of contrast enhancement in the prostatectomy bed, unchanged compared to prior examination although of uncertain significance. Correlate with PSA. 4. Unchanged moderate UIP pattern pulmonary fibrosis. 5. Emphysema. 6. Coronary artery disease. 7. Cholelithiasis. 8. Nonobstructive right  nephrolithiasis.   Aortic Atherosclerosis (ICD10-I70.0) and Emphysema (ICD10-J43.9).     Electronically Signed   By: Delanna Ahmadi M.D.   On: 06/22/2022 15:06    No results found.    PFT      No data to display         ONCOLYG HX Oct 2023 - Dr Bretta Bang  DIAGNOSIS: Extensive stage (T3, N2, M1b) small cell lung cancer presented with obstructive left upper lobe lung mass in addition to mediastinal lymphadenopathy and malignant left pleural effusion as well as pleural-based metastasis as well as suspicious liver metastasis diagnosed in June 2023.   PRIOR THERAPY: None   CURRENT THERAPY: Palliative systemic chemotherapy with carboplatin initiated for AUC of 4 on day 1 and etoposide 80 Mg/M2 on days 1, 2 and 3 with Cosela before the chemotherapy and Imfinzi 1500 Mg IV on day 1 every 3 weeks.  Status post 5 cycles.  Starting from cycle #5 the patient will be on maintenance treatment with Imfinzi 1500 Mg IV every 4 weeks. will see him back for follow-up visit in 4 weeks for evaluation with repeat CT scan of the chest, abdomen and pelvis for restaging of his disease.     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   reports that he quit smoking about 29 years ago. His smoking use included cigarettes. He has never used smokeless tobacco.  Past Surgical History:  Procedure Laterality Date   CORONARY ANGIOPLASTY WITH STENT PLACEMENT  10/30/14   STEMI s/p DES to proximal LAD; residual 80 % proximal and 60% mid LCx diease   IR IMAGING GUIDED PORT INSERTION  03/12/2022   LEFT HEART CATHETERIZATION WITH CORONARY ANGIOGRAM N/A 10/30/2014   Procedure: LEFT HEART CATHETERIZATION WITH CORONARY ANGIOGRAM;  Surgeon:  Lorretta Harp, MD;  Location: [facility] C[facility] LAB;  Service: Cardiovascular;  Laterality: N/A;   PERCUTANEOUS CORONARY STENT INTERVENTION (PCI-S)  10/30/2014   Procedure: PERCUTANEOUS CORONARY STENT INTERVENTION (PCI-S);  Surgeon: Lorretta Harp, MD;  Location: [facility] CATH LAB;  Service: Cardiovascular;;   PROSTATECTOMY     THORACENTESIS Left 12/31/2021   Procedure: THORACENTESIS;  Surgeon: Candee Furbish, MD;  Location: [facility] ENDOSCOPY;  Service: Pulmonary;  Laterality: Left;   TONSILLECTOMY     VIDEO ASSISTED THORACOSCOPY (VATS)/DECORTICATION Left 01/21/2022   Procedure: VIDEO ASSISTED THORACOSCOPY (VATS)/DECORTICATION;  Surgeon: Lajuana Matte, MD;  Location: [facility];  Service: Thoracic;  Laterality: Left;    Allergies  Allergen Reactions   Penicillins Anaphylaxis   Clindamycin Other (See Comments)   Corn Syrup [Glucose] Other (See Comments)    High fructose corn syrup causes gout   Aspirin Adult  Low [Aspirin] Itching and Rash    Dr. [name] suspects that long term caused a rash, short term use appears to be ok    Immunization History  Administered Date(s) Administered   Fluad Quad(high Dose 65+) 05/26/2022   Influenza Split 05/20/2012, 09/01/2013   Influenza, High Dose Seasonal PF 04/23/2017   Influenza, Quadrivalent, Recombinant, Inj, Pf 04/08/2018, 07/18/2020   Influenza,inj,Quad PF,6+ Mos 11/01/2014   PFIZER Comirnaty(Gray Top)Covid-19 Tri-Sucrose Vaccine 12/16/2019, 05/18/2020   PFIZER(Purple Top)SARS-COV-2 Vaccination 10/14/2019, 11/08/2019   Pneumococcal Conjugate-13 09/01/2013, 10/01/2017   Pneumococcal Polysaccharide-23 11/01/2014   Tdap 08/18/2008   Zoster, Live 09/01/2013    Family History  Problem Relation Age of Onset   Cancer Other    Coronary artery disease Brother 53       s/p CABG   Diabetes Other    Hyperlipidemia Other    Hypertension Other    Gout Other      Current Outpatient Medications:    acetaminophen (TYLENOL) 500 MG tablet, Take 500 mg by mouth  every 6 (six) hours as needed for mild pain or moderate pain., Disp: , Rfl:    ascorbic acid (VITAMIN C) 500 MG tablet, Take 1,000 mg by mouth daily., Disp: , Rfl:    Blood Pressure Monitor KIT, For daily blood pressure monitoring;  Arm Cuff, Disp: 1 each, Rfl: 0   carvedilol (COREG) 3.125 MG tablet, Take 1 tablet (3.125 mg total) by mouth 2 (two) times daily with a meal. (Patient taking differently: Take 3.125 mg by mouth 1 day or 1 dose.), Disp: 180 tablet, Rfl: 0   Cholecalciferol (VITAMIN D3) 25 MCG (1000 UT) CAPS, Take 1 capsule by mouth daily., Disp: , Rfl:    diphenhydrAMINE (BENADRYL) 25 mg capsule, Take 25 mg by mouth every 6 (six) hours as needed., Disp: , Rfl:    escitalopram (LEXAPRO) 5 MG tablet, Take 5 mg by mouth daily., Disp: , Rfl:    lidocaine-prilocaine (EMLA) cream, Apply 1 Application topically as needed., Disp: 30 g, Rfl: 2   oxyCODONE (ROXICODONE) 5 MG immediate release tablet, Take 0.5-1 tablets (2.5-5 mg total) by mouth every 4 (four) hours as needed., Disp: 30 tablet, Rfl: 0   prochlorperazine (COMPAZINE) 10 MG tablet, Take 1 tablet (10 mg total) by mouth every 6 (six) hours as needed for nausea or vomiting., Disp: 30 tablet, Rfl: 0   PROCTO-MED HC 2.5 % rectal cream, Apply topically., Disp: , Rfl:    Tiotropium Bromide Monohydrate (SPIRIVA RESPIMAT) 2.5 MCG/ACT AERS, Inhale 2 puffs into the lungs daily., Disp: 4 g, Rfl: 5      Objective:   Vitals:   07/17/22 1318  BP: 114/64  Pulse: 63  SpO2: 95%  Weight: 136 lb 3.2 oz (61.8 kg)  Height: _0  (1.727 m)    Estimated body mass index is 20.71 kg/m as calculated from the following:   Height as of this encounter: _1  (1.727 m).   Weight as of this encounter: 136 lb 3.2 oz (61.8 kg).  _2 @  Filed Weights   07/17/22 1318  Weight: 136 lb 3.2 oz (61.8 kg)     Physical Exam    General: No distress. Looks well Neuro: Alert and Oriented x 3. GCS 15. Speech normal Psych: Pleasant Resp:  Barrel  Chest - no.  Wheeze - no, Crackles - YES BASE, No overt respiratory distress CVS: Normal heart sounds. Murmurs - no Ext: Stigmata of Connective Tissue Disease - no HEENT: Normal upper airway. PEERL +. No post  nasal drip        Assessment:       ICD-10-CM   1. IPF (idiopathic pulmonary fibrosis) (HCC)  J84.112 ANA    Rheumatoid Factor    Cyclic citrul peptide antibody, IgG    QuantiFERON-TB Gold Plus    Alpha-1 antitrypsin phenotype    Pulmonary function test    Pulse oximetry, overnight    CANCELED: Pulse oximetry, overnight    2. ILD (interstitial lung disease) ([place])  J84.9     3. Pulmonary emphysema, unspecified emphysema type ([place])  J43.9 Alpha-1 antitrypsin phenotype    4. Combined pulmonary fibrosis and emphysema (CPFE) (HCC)  J43.9 ANA   J84.10 Rheumatoid Factor    Cyclic citrul peptide antibody, IgG    QuantiFERON-TB Gold Plus    Alpha-1 antitrypsin phenotype    Tiotropium Bromide Monohydrate (SPIRIVA RESPIMAT) 2.5 MCG/ACT AERS    Pulmonary function test    Pulse oximetry, overnight    CANCELED: Pulse oximetry, overnight     I think as combined pulmonary fibrosis and emphysema.  The pulmonary fibrosis very likely is IPF.  I do not know what to make of that or not but I would call this is IPF given classic UIP, progression, age greater than 30, Caucasian ethnicity.  Antifibrotic's are indicated because of UIP/IPF and also progression.  He did have failure to thrive.  Therefore we will lower the dose of antifibrotic.  He did have an MI in the summer 2023 therefore we will do pirfenidone as first-line.  For his emphysema we will do Spiriva.  I did indicate to him that given changes in cancer chemotherapy and prognosis that it is probably worth trying antifibrotic's in order to slow down the progression of ILD.  He and his wife and son are in alignment with this plan.  Currently does not need portable oxygen for simple walking of 150 feet x 3.  Will test his night saturations.   Also test autoimmune profile.  Did discuss about clinical trials as a care option.  We might have an upcoming study that is looking at osteoporosis prevalence in IPF patients.  He has never had a DEXA bone scan.  If the study is available and he is eligible.  He is willing to participate.    Plan:     Patient Instructions     ICD-10-CM   1. IPF (idiopathic pulmonary fibrosis) ([place])  J84.112     2. ILD (interstitial lung disease) ([facility])  J84.9     3. Pulmonary emphysema, unspecified emphysema type ([place])  J43.9     4. Combined pulmonary fibrosis and emphysema (CPFE) (HCC)  J43.9    J84.10       # Pulmonary fibrosis - I believe you have IPF [although you have a history of a non-] based on the fact age greater than 50, Caucasian ethnicity, classic features on CT scan and progression in the last 3-1/2 years.  And previous smoking history  Plan - Check ANA, rheumatoid factor, CCP, QuantiFERON gold -Start pirfenidone low-dose protocol [2 pills 3 times daily max]  -Start with 1 pill 3 times daily with food for 1 week and then escalate to 2 pills 3 times daily and stay on that dose  -Take 5-6 hours apart  -Apply sunscreen when you go out  -Drink plenty of water  #emphysema -Do alpha-1 antitrypsin blood work -Start Spiriva Respimat high-dose 2 puffs once daily  # Combined emphysema and pulmonary fibrosis -  - Do full pulmonary function  test at some point in the next few months -Check overnight oxygen at home pulse oximetry -Do simple walking desaturation test  -If these are normal you can return the oxygen system   - you can return portable o2 based on walk test 07/17/2022   #Followup  - 6-8 weeks with nurse practitioner to review progress or he can see me if I have a opening available -12 weeks with Dr. Chase Caller 30-minute visit  -7 score and walking desaturation test at follow-up  ( Level 05 visit: Estb 40-54 min in  visit type: on-site physical face to visit  in total care  time and counseling or/and coordination of care by this undersigned MD - Dr Brand Males. This includes one or more of the following on this same day 07/17/2022: pre-charting, chart review, note writing, documentation discussion of test results, diagnostic or treatment recommendations, prognosis, risks and benefits of management options, instructions, education, compliance or risk-factor reduction. It excludes time spent by the [facility] or office staff in the care of the patient. Actual time 50 min)   SIGNATURE    Dr. Brand Males, M.D., F.C.C.P,  Pulmonary and Critical Care Medicine Staff Physician, [facility] Director - Interstitial Lung Disease  Program  P[facility] at [place], [place], [address]  Pager: [phone], If no answer or between  15:00h - 7:00h: call [phone] Telephone: [phone]  5:20 PM 07/17/2022

## 2022-07-18 ENCOUNTER — Other Ambulatory Visit: Payer: Self-pay

## 2022-07-21 ENCOUNTER — Inpatient Hospital Stay: Payer: Medicare Other

## 2022-07-21 ENCOUNTER — Telehealth: Payer: Self-pay

## 2022-07-21 ENCOUNTER — Encounter: Payer: Self-pay | Admitting: Internal Medicine

## 2022-07-21 ENCOUNTER — Other Ambulatory Visit (HOSPITAL_BASED_OUTPATIENT_CLINIC_OR_DEPARTMENT_OTHER): Payer: Self-pay | Admitting: Internal Medicine

## 2022-07-21 ENCOUNTER — Inpatient Hospital Stay: Payer: Medicare Other | Attending: Internal Medicine | Admitting: Internal Medicine

## 2022-07-21 ENCOUNTER — Inpatient Hospital Stay: Payer: Medicare Other | Admitting: Nutrition

## 2022-07-21 ENCOUNTER — Other Ambulatory Visit: Payer: Self-pay

## 2022-07-21 VITALS — BP 115/49 | HR 66 | Temp 97.3°F | Resp 16 | Wt 134.1 lb

## 2022-07-21 DIAGNOSIS — C3482 Malignant neoplasm of overlapping sites of left bronchus and lung: Secondary | ICD-10-CM | POA: Insufficient documentation

## 2022-07-21 DIAGNOSIS — Z5112 Encounter for antineoplastic immunotherapy: Secondary | ICD-10-CM | POA: Insufficient documentation

## 2022-07-21 DIAGNOSIS — Z79899 Other long term (current) drug therapy: Secondary | ICD-10-CM | POA: Insufficient documentation

## 2022-07-21 DIAGNOSIS — C787 Secondary malignant neoplasm of liver and intrahepatic bile duct: Secondary | ICD-10-CM | POA: Insufficient documentation

## 2022-07-21 DIAGNOSIS — C3490 Malignant neoplasm of unspecified part of unspecified bronchus or lung: Secondary | ICD-10-CM | POA: Diagnosis not present

## 2022-07-21 DIAGNOSIS — Z95828 Presence of other vascular implants and grafts: Secondary | ICD-10-CM

## 2022-07-21 DIAGNOSIS — C782 Secondary malignant neoplasm of pleura: Secondary | ICD-10-CM | POA: Diagnosis not present

## 2022-07-21 DIAGNOSIS — J84112 Idiopathic pulmonary fibrosis: Secondary | ICD-10-CM | POA: Diagnosis not present

## 2022-07-21 DIAGNOSIS — J8410 Pulmonary fibrosis, unspecified: Secondary | ICD-10-CM | POA: Diagnosis not present

## 2022-07-21 DIAGNOSIS — J439 Emphysema, unspecified: Secondary | ICD-10-CM | POA: Diagnosis not present

## 2022-07-21 LAB — CBC WITH DIFFERENTIAL (CANCER CENTER ONLY)
Abs Immature Granulocytes: 0.04 10*3/uL (ref 0.00–0.07)
Basophils Absolute: 0 10*3/uL (ref 0.0–0.1)
Basophils Relative: 0 %
Eosinophils Absolute: 0.3 10*3/uL (ref 0.0–0.5)
Eosinophils Relative: 4 %
HCT: 39.5 % (ref 39.0–52.0)
Hemoglobin: 12.6 g/dL — ABNORMAL LOW (ref 13.0–17.0)
Immature Granulocytes: 1 %
Lymphocytes Relative: 23 %
Lymphs Abs: 1.9 10*3/uL (ref 0.7–4.0)
MCH: 29 pg (ref 26.0–34.0)
MCHC: 31.9 g/dL (ref 30.0–36.0)
MCV: 91 fL (ref 80.0–100.0)
Monocytes Absolute: 0.9 10*3/uL (ref 0.1–1.0)
Monocytes Relative: 11 %
Neutro Abs: 5.1 10*3/uL (ref 1.7–7.7)
Neutrophils Relative %: 61 %
Platelet Count: 204 10*3/uL (ref 150–400)
RBC: 4.34 MIL/uL (ref 4.22–5.81)
RDW: 14.4 % (ref 11.5–15.5)
WBC Count: 8.3 10*3/uL (ref 4.0–10.5)
nRBC: 0 % (ref 0.0–0.2)

## 2022-07-21 LAB — CMP (CANCER CENTER ONLY)
ALT: 9 U/L (ref 0–44)
AST: 17 U/L (ref 15–41)
Albumin: 3.8 g/dL (ref 3.5–5.0)
Alkaline Phosphatase: 68 U/L (ref 38–126)
Anion gap: 4 — ABNORMAL LOW (ref 5–15)
BUN: 18 mg/dL (ref 8–23)
CO2: 30 mmol/L (ref 22–32)
Calcium: 9.4 mg/dL (ref 8.9–10.3)
Chloride: 106 mmol/L (ref 98–111)
Creatinine: 0.86 mg/dL (ref 0.61–1.24)
GFR, Estimated: >60 mL/min (ref >60)
Glucose, Bld: 125 mg/dL — ABNORMAL HIGH (ref 70–99)
Potassium: 3.8 mmol/L (ref 3.5–5.1)
Sodium: 140 mmol/L (ref 135–145)
Total Bilirubin: 0.4 mg/dL (ref 0.3–1.2)
Total Protein: 6.4 g/dL — ABNORMAL LOW (ref 6.5–8.1)

## 2022-07-21 LAB — TSH: TSH: 3.676 u[IU]/mL (ref 0.350–4.500)

## 2022-07-21 MED ORDER — SODIUM CHLORIDE 0.9 % IV SOLN: Freq: Once | INTRAVENOUS | Status: AC

## 2022-07-21 MED ORDER — SODIUM CHLORIDE 0.9% FLUSH
10.0000 mL | INTRAVENOUS | Status: DC | PRN
  Administered 2022-07-21: 10 mL

## 2022-07-21 MED ORDER — SODIUM CHLORIDE 0.9% FLUSH: 10.0000 mL | Freq: Once | INTRAVENOUS | Status: DC

## 2022-07-21 MED ORDER — SODIUM CHLORIDE 0.9 % IV SOLN
1500.0000 mg | Freq: Once | INTRAVENOUS | Status: AC
  Administered 2022-07-21: 1500 mg via INTRAVENOUS
  Filled 2022-07-21: qty 30

## 2022-07-21 MED ORDER — HEPARIN SOD (PORK) LOCK FLUSH 100 UNIT/ML IV SOLN
500.0000 [IU] | Freq: Once | INTRAVENOUS | Status: AC | PRN
  Administered 2022-07-21: 500 [IU]

## 2022-07-21 NOTE — Patient Instructions (Signed)
[facility] ONCOLOG[facility]  Discharge Instructions: Thank you for choosing [facility] to provide your oncology and hematology care.   If you have a lab appointment with the [facility], please go directly to the [facility] and check in at the registration area.   Wear comfortable clothing and clothing appropriate for easy access to any Portacath or PICC line.   We strive to give you quality time with your provider. You may need to reschedule your appointment if you arrive late (15 or more minutes).  Arriving late affects you and other patients whose appointments are after yours.  Also, if you miss three or more appointments without notifying the office, you may be dismissed from the clinic at the provider's discretion.      For prescription refill requests, have your pharmacy contact our office and allow 72 hours for refills to be completed.    Today you received the following chemotherapy and/or immunotherapy agents imfinzi       To help prevent nausea and vomiting after your treatment, we encourage you to take your nausea medication as directed.  BELOW ARE SYMPTOMS THAT SHOULD BE REPORTED IMMEDIATELY: *FEVER GREATER THAN 100.4 F (38 C) OR HIGHER *CHILLS OR SWEATING *NAUSEA AND VOMITING THAT IS NOT CONTROLLED WITH YOUR NAUSEA MEDICATION *UNUSUAL SHORTNESS OF BREATH *UNUSUAL BRUISING OR BLEEDING *URINARY PROBLEMS (pain or burning when urinating, or frequent urination) *BOWEL PROBLEMS (unusual diarrhea, constipation, pain near the anus) TENDERNESS IN MOUTH AND THROAT WITH OR WITHOUT PRESENCE OF ULCERS (sore throat, sores in mouth, or a toothache) UNUSUAL RASH, SWELLING OR PAIN  UNUSUAL VAGINAL DISCHARGE OR ITCHING   Items with * indicate a potential emergency and should be followed up as soon as possible or go to the Emergency Department if any problems should occur.  Please show the CHEMOTHERAPY ALERT CARD or IMMUNOTHERAPY ALERT CARD at check-in to  the Emergency Department and triage nurse.  Should you have questions after your visit or need to cancel or reschedule your appointment, please contact [facility]  Dept: [phone]  and follow the prompts.  Office hours are 8:00 a.m. to 4:30 p.m. Monday - Friday. Please note that voicemails left after 4:00 p.m. may not be returned until the following business day.  We are closed weekends and major holidays. You have access to a nurse at all times for urgent questions. Please call the main number to the clinic Dept: [phone] and follow the prompts.   For any non-urgent questions, you may also contact your provider using MyChart. We now offer e-Visits for anyone 48 and older to request care online for non-urgent symptoms. For details visit [email or web address].   Also download the MyChart app! Go to the app store, search "MyChart", open the app, select [facility], and log in with your MyChart username and password.  Masks are optional in the cancer centers. If you would like for your care team to wear a mask while they are taking care of you, please let them know. You may have one support person who is at least 81 years old accompany you for your appointments.

## 2022-07-21 NOTE — Progress Notes (Signed)
Nutrition follow-up completed with patient and son during infusion for small cell lung cancer.  Weight improved and was documented as 134 pounds 1 ounce on December 4.  Noted glucose 125.  Patient denies nutrition impact symptoms.  He has changed oral nutrition supplements to ensure and discontinued Premier protein shakes.  Patient verbalizes he will not eat any foods with high fructose corn syrup.  He does enjoy ice cream occasionally.  He is concerned about sugar and developing diabetes.  Nutrition diagnosis: Severe malnutrition, improving.  Intervention: Educated patient on strategies for paring simple sugars with complex carbohydrates for better glycemic control. Discouraged patient from avoiding most foods and stressed importance of weight maintenance/weight gain. Educated on high fructose corn syrup and alternatives. Continue ensure as desired.  Monitoring, evaluation, goals: Patient will tolerate increased calories and protein to minimize weight loss.  No follow-up has been scheduled.  Please contact RD with further questions or concerns.  **Disclaimer: This note was dictated with voice recognition software. Similar sounding words can inadvertently be transcribed and this note may contain transcription errors which may not have been corrected upon publication of note.**

## 2022-07-21 NOTE — Progress Notes (Signed)
[facility] [phone]   Fax:([phone]  OFFICE PROGRESS NOTE  Shon Baton, MD Pedro Pope [id number]  DIAGNOSIS: Extensive stage (T3, N2, M1b) small cell lung cancer presented with obstructive left upper lobe lung mass in addition to mediastinal lymphadenopathy and malignant left pleural effusion as well as pleural-based metastasis as well as suspicious liver metastasis diagnosed in June 2023.  PRIOR THERAPY: None  CURRENT THERAPY: Palliative systemic chemotherapy with carboplatin initiated for AUC of 4 on day 1 and etoposide 80 Mg/M2 on days 1, 2 and 3 with Cosela before the chemotherapy and Imfinzi 1500 Mg IV on day 1 every 3 weeks.  Status post 7 cycles.  Starting from cycle #5 the patient will be on maintenance treatment with Imfinzi 1500 Mg IV every 4 weeks.  INTERVAL HISTORY: Pedro Pope. 81 y.o. male returns to the clinic today for follow-up visit accompanied by his son.  The patient is feeling fine today with no concerning complaints except for the itching from the immunotherapy.  He takes Benadryl around 4 times a day with some relief.  He denied having any current chest pain, shortness of breath, cough or hemoptysis.  He was seen by Dr. Chase Caller for his pulmonary symptoms and he has some signs of early pulmonary fibrosis.  He recommended for him to use oxygen only at nighttime and he is expected to do sleep study.  He denied having any fever or chills.  He has no nausea, vomiting, diarrhea or constipation.  He does not use a walker or wheelchair anymore.  He is here today for evaluation before starting cycle #8 of his treatment.  MEDICAL HISTORY: Past Medical History:  Diagnosis Date   Anosmia    CAD (coronary artery disease), native coronary artery    a. 10/29/2013 antlat STEMI s/p DES to pLAD; residual 80 % proximal and 60% mid LCx diease   Chronic headaches    Dressler's syndrome (HCC)    a. placed on colchicine    ED  (erectile dysfunction)    Elevated TSH    Fatty liver    Gout    Ischemic cardiomyopathy    a. 2D ECHO 11/04/14 w/ EF 40-45% with LV WMA, G2DD, mild MR, mild LA dilation, mod TR.   PAF (paroxysmal atrial fibrillation) ([place])    a. Dx on 11/03/14 admission. Not placed on longterm AC due to DAPT w/ receent DES. Will plan for outpt heart monitor    Pre-diabetes    a. HgA1c 6.1 in 10/2014   Prostate cancer [facility])    s/p prostatectomy and XRT   Rheumatic fever 1947    ALLERGIES:  is allergic to penicillins, clindamycin, corn syrup [glucose], and aspirin adult low [aspirin].  MEDICATIONS:  Current Outpatient Medications  Medication Sig Dispense Refill   acetaminophen (TYLENOL) 500 MG tablet Take 500 mg by mouth every 6 (six) hours as needed for mild pain or moderate pain.     ascorbic acid (VITAMIN C) 500 MG tablet Take 1,000 mg by mouth daily.     Blood Pressure Monitor KIT For daily blood pressure monitoring;  Arm Cuff 1 each 0   carvedilol (COREG) 3.125 MG tablet Take 1 tablet (3.125 mg total) by mouth 2 (two) times daily with a meal. (Patient taking differently: Take 3.125 mg by mouth 1 day or 1 dose.) 180 tablet 0   Cholecalciferol (VITAMIN D3) 25 MCG (1000 UT) CAPS Take 1 capsule by mouth daily.  diphenhydrAMINE (BENADRYL) 25 mg capsule Take 25 mg by mouth every 6 (six) hours as needed.     escitalopram (LEXAPRO) 5 MG tablet Take 5 mg by mouth daily.     lidocaine-prilocaine (EMLA) cream Apply 1 Application topically as needed. 30 g 2   oxyCODONE (ROXICODONE) 5 MG immediate release tablet Take 0.5-1 tablets (2.5-5 mg total) by mouth every 4 (four) hours as needed. 30 tablet 0   prochlorperazine (COMPAZINE) 10 MG tablet Take 1 tablet (10 mg total) by mouth every 6 (six) hours as needed for nausea or vomiting. 30 tablet 0   PROCTO-MED HC 2.5 % rectal cream Apply topically.     Tiotropium Bromide Monohydrate (SPIRIVA RESPIMAT) 2.5 MCG/ACT AERS Inhale 2 puffs into the lungs daily. 4 g 5    No current facility-administered medications for this visit.    SURGICAL HISTORY:  Past Surgical History:  Procedure Laterality Date   CORONARY ANGIOPLASTY WITH STENT PLACEMENT  10/30/14   STEMI s/p DES to proximal LAD; residual 80 % proximal and 60% mid LCx diease   IR IMAGING GUIDED PORT INSERTION  03/12/2022   LEFT HEART CATHETERIZATION WITH CORONARY ANGIOGRAM N/A 10/30/2014   Procedure: LEFT HEART CATHETERIZATION WITH CORONARY ANGIOGRAM;  Surgeon: Lorretta Harp, MD;  Location[facility] CATH LAB;  Service: Cardiovascular;  Laterality: N/A;   PERCUTANEOUS CORONARY STENT INTERVENTION (PCI-S)  10/30/2014   Procedure: PERCUTANEOUS CORONARY STENT INTERVENTION (PCI-S);  Surgeon: Lorretta Harp, MD;  Location: [facility] CATH LAB;  Service: Cardiovascular;;   PROSTATECTOMY     THORACENTESIS Left 12/31/2021   Procedure: THORACENTESIS;  Surgeon: Candee Furbish, MD;  Location: [facility] ENDOSCOPY;  Service: Pulmonary;  Laterality: Left;   TONSILLECTOMY     VIDEO ASSISTED THORACOSCOPY (VATS)/DECORTICATION Left 01/21/2022   Procedure: VIDEO ASSISTED THORACOSCOPY (VATS)/DECORTICATION;  Surgeon: Lajuana Matte, MD;  Location: [facility] OR;  Service: Thoracic;  Laterality: Left;    REVIEW OF SYSTEMS:  A comprehensive review of systems was negative except for: Integument/breast: positive for pruritus   PHYSICAL EXAMINATION: General appearance: alert, cooperative, and no distress Head: Normocephalic, without obvious abnormality, atraumatic Neck: no adenopathy, no JVD, supple, symmetrical, trachea midline, and thyroid not enlarged, symmetric, no tenderness/mass/nodules Lymph nodes: Cervical, supraclavicular, and axillary nodes normal. Resp: clear to auscultation bilaterally Back: symmetric, no curvature. ROM normal. No CVA tenderness. Cardio: regular rate and rhythm, S1, S2 normal, no murmur, click, rub or gallop GI: soft, non-tender; bowel sounds normal; no masses,  no organomegaly Extremities: extremities normal,  atraumatic, no cyanosis or edema  ECOG PERFORMANCE STATUS: 1 - Symptomatic but completely ambulatory  Blood pressure (!) 115/49, pulse 66, temperature (!) 97.3 F (36.3 C), temperature source Oral, resp. rate 16, weight 134 lb 1 oz (60.8 kg), SpO2 97 %.  LABORATORY DATA: Lab Results  Component Value Date   WBC 8.3 07/21/2022   HGB 12.6 (L) 07/21/2022   HCT 39.5 07/21/2022   MCV 91.0 07/21/2022   PLT 204 07/21/2022      Chemistry      Component Value Date/Time   NA 142 06/23/2022 0738   K 3.9 06/23/2022 0738   CL 108 06/23/2022 0738   CO2 29 06/23/2022 0738   BUN 19 06/23/2022 0738   CREATININE 0.79 06/23/2022 0738      Component Value Date/Time   CALCIUM 8.8 (L) 06/23/2022 0738   ALKPHOS 71 06/23/2022 0738   AST 17 06/23/2022 0738   ALT 7 06/23/2022 0738   BILITOT 0.3 06/23/2022 3354  RADIOGRAPHIC STUDIES: MR Brain W Wo Contrast  Result Date: 07/02/2022 CLINICAL DATA:  Small cell lung cancer (SCLC), staging Restaging, c/o of new frequent headaches. EXAM: MRI HEAD WITHOUT AND WITH CONTRAST TECHNIQUE: Multiplanar, multiecho pulse sequences of the brain and surrounding structures were obtained without and with intravenous contrast. CONTRAST:  87m GADAVIST GADOBUTROL 1 MMOL/ML IV SOLN COMPARISON:  MRI June 25, 23. FINDINGS: Brain: No acute infarction, hemorrhage, hydrocephalus, extra-axial collection or mass lesion. No pathologic enhancement. Vascular: Major arterial flow voids are maintained at the skull base. Skull and upper cervical spine: Normal marrow signal. Sinuses/Orbits: Mild paranasal sinus mucosal thickening. No acute orbital findings. Other: No mastoid effusions. IMPRESSION: No evidence of acute intracranial abnormality or metastatic disease. Electronically Signed   By: FMargaretha SheffieldM.D.   On: 07/02/2022 08:35    ASSESSMENT AND PLAN: This is a very pleasant 81years old white male with Extensive stage (T3, N2, M1 a) small cell lung cancer presented with  obstructive left upper lobe lung mass in addition to mediastinal lymphadenopathy and malignant left pleural effusion as well as pleural-based metastasis and suspicious liver metastasis diagnosed in June 2023. The patient is currently undergoing systemic chemotherapy with carboplatin for AUC of 4 on day 1, etoposide 80 Mg/M2 on days 1, 2 and 3 with Imfinzi 240 Mg/M2 on the days before chemotherapy and Imfinzi 1500 Mg IV on day 1 every 3 weeks.  Status post 7 cycles.  Starting from cycle #5 he will be on maintenance treatment with immunotherapy with Imfinzi 1500 Mg IV every 4 weeks. The patient has been tolerating this treatment well with no concerning adverse effect except for the pruritus.  He is currently on Benadryl for the itching. I recommended for the patient to proceed with cycle #8 today as planned. I will see him back for follow-up visit in 4 weeks for evaluation with repeat CT scan of the chest, abdomen and pelvis for restaging of his disease. The patient was advised to call immediately if he has any other concerning symptoms in the interval. The patient voices understanding of current disease status and treatment options and is in agreement with the current care plan.  All questions were answered. The patient knows to call the clinic with any problems, questions or concerns. We can certainly see the patient much sooner if necessary.  The total time spent in the appointment was 20 minutes.  Disclaimer: This note was dictated with voice recognition software. Similar sounding words can inadvertently be transcribed and may not be corrected upon review.

## 2022-07-21 NOTE — Telephone Encounter (Signed)
Received New start paperwork for ESBRIET. Will update as we work through the benefits process.  Submitted a Prior Authorization request to [facility] for ESBRIET via CoverMyMeds. Will update once we receive a response.  Key: [id number]   PAP documents placed in Pending Info folder.

## 2022-07-22 LAB — T4: T4, Total: 4.4 ug/dL — ABNORMAL LOW (ref 4.5–12.0)

## 2022-07-23 ENCOUNTER — Other Ambulatory Visit (HOSPITAL_COMMUNITY): Payer: Self-pay

## 2022-07-23 ENCOUNTER — Encounter: Payer: Self-pay | Admitting: Internal Medicine

## 2022-07-23 NOTE — Telephone Encounter (Signed)
Received notification from [facility] regarding a prior authorization for ESBRIET. Authorization has been APPROVED from 08/18/2021 to 08/17/2022. Approval letter sent to scan center.  Per test claim, copay for 30 days supply is $2,396.03  Patient can fill through [place]: [phone]   Authorization # [id number]    Submitted Patient Assistance Application to [facility] for [place] along with provider portion, PA and income documents. Will update patient when we receive a response.  Fax# [phone] Phone# [phone]

## 2022-07-25 DIAGNOSIS — G4730 Sleep apnea, unspecified: Secondary | ICD-10-CM | POA: Diagnosis not present

## 2022-07-25 DIAGNOSIS — R0683 Snoring: Secondary | ICD-10-CM | POA: Diagnosis not present

## 2022-07-26 DIAGNOSIS — M79672 Pain in left foot: Secondary | ICD-10-CM | POA: Diagnosis not present

## 2022-07-28 ENCOUNTER — Encounter: Payer: Self-pay | Admitting: Internal Medicine

## 2022-07-28 ENCOUNTER — Other Ambulatory Visit: Payer: Self-pay | Admitting: Medical Oncology

## 2022-07-28 LAB — RHEUMATOID FACTOR: Rheumatoid fact SerPl-aCnc: <10 IU/mL (ref <14.0)

## 2022-07-28 LAB — QUANTIFERON-TB GOLD PLUS
QuantiFERON Mitogen Value: >10 IU/mL
QuantiFERON Nil Value: 0 IU/mL
QuantiFERON TB1 Ag Value: 0.17 IU/mL
QuantiFERON TB2 Ag Value: 0.13 IU/mL
QuantiFERON-TB Gold Plus: NEGATIVE

## 2022-07-28 LAB — ANA: Anti Nuclear Antibody (ANA): POSITIVE — AB

## 2022-07-28 NOTE — Telephone Encounter (Signed)
Mychart message sent by pt:  Resa Miner Espinal (proxy for [name].)  P [facility] Pool (supporting Brand Males, MD)35 minutes ago (3:11 PM)   CE Hello - We were able to see Dad's oncologist and he wanted [place] to pause on taking the medication for the pulmonary fibrosis. He thought it was not good to mix that with the meds for this cancer treatment.   He did say the inhaler was okay to use, but when we went to pick it up, it was over $300 a month. Is there a generic? or a less expensive option. Dad is on a limited budget and this would be hard for them to swing.     Thanks - [name]    Please advise if there is another alternative to spiriva that would be more affordable for pt.

## 2022-07-29 ENCOUNTER — Encounter: Payer: Self-pay | Admitting: Internal Medicine

## 2022-07-29 ENCOUNTER — Other Ambulatory Visit (HOSPITAL_COMMUNITY): Payer: Self-pay

## 2022-07-29 NOTE — Telephone Encounter (Signed)
Ok to pause the ofev till chemo complet  Spiria- every insuranc eplan prices differently. THey have to call their insurance and ask what is cheapest inhalers . They can also look at 90 day supply. Meanwhile , please give samples for 1 month of spiriva or stiolto or incruse or bevespi or [place]

## 2022-07-29 NOTE — Telephone Encounter (Signed)
Give incruse samples if we have - if not try incruse prescription

## 2022-07-29 NOTE — Telephone Encounter (Signed)
Please see message from prior auth team.

## 2022-07-29 NOTE — Telephone Encounter (Signed)
Per benefits investigation Incruse is also covered by the patients insurance at this time.

## 2022-07-30 MED ORDER — INCRUSE ELLIPTA 62.5 MCG/ACT IN AEPB: 1.0000 | INHALATION_SPRAY | Freq: Every day | RESPIRATORY_TRACT | 5 refills | Status: DC

## 2022-07-30 NOTE — Telephone Encounter (Signed)
We no longer get incruse samples so Rx will be sent to pharmacy for pt.

## 2022-08-01 LAB — SPECIMEN STATUS REPORT

## 2022-08-01 LAB — ALPHA-1-ANTITRYPSIN PHENOTYP: A-1 Antitrypsin: 125 mg/dL (ref 101–187)

## 2022-08-01 LAB — CYCLIC CITRUL PEPTIDE ANTIBODY, IGG/IGA: Cyclic Citrullin Peptide Ab: <1 units (ref 0–19)

## 2022-08-06 DIAGNOSIS — M79672 Pain in left foot: Secondary | ICD-10-CM | POA: Diagnosis not present

## 2022-08-08 ENCOUNTER — Encounter: Payer: Self-pay | Admitting: Internal Medicine

## 2022-08-14 ENCOUNTER — Telehealth: Payer: Self-pay | Admitting: Internal Medicine

## 2022-08-14 NOTE — Telephone Encounter (Signed)
Called and spoke with patient, took the incruse inhaler fine until Christmas Eve.  Inhaled powder and it made him extremely dizzy suddenly when he blew it out.  Did not last long.  Has not taken since then.  He has not comfortable taking it any further.  He denies any other new medications or history of dizziness with low blood pressure.  He does state that his blood pressure runs on the low side.  He said there are 24 puffs left on the inhaler.  He would like to know what is recommended as far as inhalers.  Katie, please advise.  Thank you.  Allergies  Allergen Reactions   Penicillins Anaphylaxis   Clindamycin Other (See Comments)   Corn Syrup [Glucose] Other (See Comments)      High fructose corn syrup causes gout   Aspirin Adult Low [Aspirin] Itching and Rash      Dr. [name] suspects that long term caused a rash, short term use appears to be ok            OV 07/17/2022 -transfer of care to the ILD center wit Dr. Chase Caller.  Previous patient of Dr. Silas Flood   Subjective:  Patient ID: Pedro Pope., male , DOB: 06-Nov-1940 , age 81 y.o. , MRN: [id number] , ADDRESS: [place] [id number] PCP Shon Baton, MD Patient Care Team: Shon Baton, MD as PCP - General (Internal Medicine) Lorretta Harp, MD as PCP - Cardiology (Cardiology) Hunsucker, Bonna Gains, MD as Consulting Physician (Pulmonary Disease)   This Provider for this visit: Treatment Team:  Attending Provider: Brand Males, MD       07/17/2022 -       Chief Complaint  Patient presents with   Follow-up      Pt states he has been doing okay since last visit.        HPI Pedro Pope. 81 y.o. -presents with his wife and son Pedro Pope.  He is originally from [place].  He is still in [employer] but also [place] he is retired he lives in [place] to be near his son.  History is gained from review of the chart but also talking to him and his wife.  He is a former smoker.  He has  a previous history of prostate cancer.  In the summer of 2023 had malignant pleural effusion on the left side from lung cancer and was status post Pleurx which was removed in the fall 2023.  He has been undergoing immunotherapy with Dr. Julien Nordmann.  He is tolerating it well.  The cancer is improved.  He does have some skin lesions on his right forearm.  He wants this gone.  We have advised him to talk to Dr. Julien Nordmann about it.  During the initial diagnose of cancer in the workup he was on 4 L of oxygen but currently he does not feel he needs it and does not using it.  His wife and son feel that physician needs to ascertain if he needs it.  He had failure to thrive and lost a lot of weight from baseline 145 pounds 210 pounds.  He is currently regained much of it and is at 135 pounds approximately.  Overall he feels good he is doing his activities of daily living.  He is able to do stuff around the house but when he goes to [employer] with the son he gets quite short of breath.  Current walking desaturation test is normal without significant  desaturation although he did drop 3 points     Review of his CT from 2 and half years ago CT abdomen lung image when he had hematuria showed some ILD in the lung bases.  There is no clear-cut honeycombing in my opinion but was subpleural bilateral bibasal with reticulation.  Current CT scan of the chest shows classic UIP features in my view.  There is also associated emphysema.   ILD risk factors/IPF risk factors -Former smoker -Age greater than 53 and Caucasian ethnicity -Progression with classic UIP -He gives a history of Raynaud's but no autoimmune disease -No serology available -Ongoing immunotherapy for lung cancer -Associated emphysema     Simple office walk 185 feet x  3 laps goal with forehead probe 07/17/2022      O2 used ra    Number laps completed 3    Comments about pace avg    Resting Pulse Ox/HR 100% and 59/min    Final Pulse Ox/HR 97% and 88/min     Desaturated </= 88% no    Desaturated <= 3% points yes    Got Tachycardic >/= 90/min no    Symptoms at end of test Mild dyspnea     Miscellaneous comments x            CT Chest data - HRCT 06/19/22      IMPRESSION: 1. Diminished left pleural effusion, with a trace residual and some associated pleural thickening, consistent with treated pleural metastatic disease. 2. Hypodense lesion of the posterior liver dome, hepatic segment VII is diminished in size, measuring no greater than 0.4 cm, consistent with treatment response of a small hepatic metastasis. 3. Status post prostatectomy. Small focus of contrast enhancement in the prostatectomy bed, unchanged compared to prior examination although of uncertain significance. Correlate with PSA. 4. Unchanged moderate UIP pattern pulmonary fibrosis. 5. Emphysema. 6. Coronary artery disease. 7. Cholelithiasis. 8. Nonobstructive right nephrolithiasis.   Aortic Atherosclerosis (ICD10-I70.0) and Emphysema (ICD10-J43.9).     Electronically Signed   By: Delanna Ahmadi M.D.   On: 06/22/2022 15:06     No results found.       PFT         No data to display           ONCOLYG HX Oct 2023 - Dr Bretta Bang   DIAGNOSIS: Extensive stage (T3, N2, M1b) small cell lung cancer presented with obstructive left upper lobe lung mass in addition to mediastinal lymphadenopathy and malignant left pleural effusion as well as pleural-based metastasis as well as suspicious liver metastasis diagnosed in June 2023.   PRIOR THERAPY: None   CURRENT THERAPY: Palliative systemic chemotherapy with carboplatin initiated for AUC of 4 on day 1 and etoposide 80 Mg/M2 on days 1, 2 and 3 with Cosela before the chemotherapy and Imfinzi 1500 Mg IV on day 1 every 3 weeks.  Status post 5 cycles.  Starting from cycle #5 the patient will be on maintenance treatment with Imfinzi 1500 Mg IV every 4 weeks. will see him back for follow-up visit in 4 weeks for evaluation with  repeat CT scan of the chest, abdomen and pelvis for restaging of his disease.       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    reports that he quit smoking about  29 years ago. His smoking use included cigarettes. He has never used smokeless tobacco.        Past Surgical History:  Procedure Laterality Date   CORONARY ANGIOPLASTY WITH STENT PLACEMENT   10/30/14    STEMI s/p DES to proximal LAD; residual 80 % proximal and 60% mid LCx diease   IR IMAGING GUIDED PORT INSERTION   03/12/2022   LEFT HEART CATHETERIZATION WITH CORONARY ANGIOGRAM N/A 10/30/2014    Procedure: LEFT HEART CATHETERIZATION WITH CORONARY ANGIOGRAM;  Surgeon: Lorretta Harp, MD;  Location: [facility] CATH LAB;  Service: Cardiovascular;  Laterality: N/A;   PERCUTANEOUS CORONARY STENT INTERVENTION (PCI-S)   10/30/2014    Procedure: PERCUTANEOUS CORONARY STENT INTERVENTION (PCI-S);  Surgeon: Lorretta Harp, MD;  Location: [facility] CATH LAB;  Service: Cardiovascular;;   PROSTATECTOMY       THORACENTESIS Left 12/31/2021    Procedure: THORACENTESIS;  Surgeon: Candee Furbish, MD;  Location[facility] ENDOSCOPY;  Service: Pulmonary;  Laterality: Left;   TONSILLECTOMY       VIDEO ASSISTED THORACOSCOPY (VATS)/DECORTICATION Left 01/21/2022    Procedure: VIDEO ASSISTED THORACOSCOPY (VATS)/DECORTICATION;  Surgeon: Lajuana Matte, MD;  Location: [facility];  Service: Thoracic;  Laterality: Left;           Allergies  Allergen Reactions   Penicillins Anaphylaxis   Clindamycin Other (See Comments)   Corn Syrup [Glucose] Other (See Comments)      High fructose corn syrup causes gout   Aspirin Adult Low [Aspirin] Itching and Rash      Dr. [name] suspects that long term  caused a rash, short term use appears to be ok          Immunization History  Administered Date(s) Administered   Fluad Quad(high Dose 65+) 05/26/2022   Influenza Split 05/20/2012, 09/01/2013   Influenza, High Dose Seasonal PF 04/23/2017   Influenza, Quadrivalent, Recombinant, Inj, Pf 04/08/2018, 07/18/2020   Influenza,inj,Quad PF,6+ Mos 11/01/2014   PFIZER Comirnaty(Gray Top)Covid-19 Tri-Sucrose Vaccine 12/16/2019, 05/18/2020   PFIZER(Purple Top)SARS-COV-2 Vaccination 10/14/2019, 11/08/2019   Pneumococcal Conjugate-13 09/01/2013, 10/01/2017   Pneumococcal Polysaccharide-23 11/01/2014   Tdap 08/18/2008   Zoster, Live 09/01/2013           Family History  Problem Relation Age of Onset   Cancer Other     Coronary artery disease Brother 4        s/p CABG   Diabetes Other     Hyperlipidemia Other     Hypertension Other     Gout Other          Current Outpatient Medications:    acetaminophen (TYLENOL) 500 MG tablet, Take 500 mg by mouth every 6 (six) hours as needed for mild pain or moderate pain., Disp: , Rfl:    ascorbic acid (VITAMIN C) 500 MG tablet, Take 1,000 mg by mouth daily., Disp: , Rfl:    Blood Pressure Monitor KIT, For daily blood pressure monitoring;  Arm Cuff, Disp: 1 each, Rfl: 0   carvedilol (COREG) 3.125 MG tablet, Take 1 tablet (3.125 mg total) by mouth 2 (two) times daily with a meal. (Patient taking differently: Take 3.125 mg by mouth 1 day or 1 dose.), Disp: 180 tablet, Rfl: 0   Cholecalciferol (VITAMIN D3) 25 MCG (1000 UT) CAPS, Take 1 capsule by mouth daily., Disp: , Rfl:    diphenhydrAMINE (BENADRYL) 25 mg capsule, Take 25 mg by mouth every 6 (six) hours as needed., Disp: , Rfl:    escitalopram (LEXAPRO) 5 MG tablet, Take  5 mg by mouth daily., Disp: , Rfl:    lidocaine-prilocaine (EMLA) cream, Apply 1 Application topically as needed., Disp: 30 g, Rfl: 2   oxyCODONE (ROXICODONE) 5 MG immediate release tablet, Take 0.5-1 tablets (2.5-5 mg total) by mouth  every 4 (four) hours as needed., Disp: 30 tablet, Rfl: 0   prochlorperazine (COMPAZINE) 10 MG tablet, Take 1 tablet (10 mg total) by mouth every 6 (six) hours as needed for nausea or vomiting., Disp: 30 tablet, Rfl: 0   PROCTO-MED HC 2.5 % rectal cream, Apply topically., Disp: , Rfl:    Tiotropium Bromide Monohydrate (SPIRIVA RESPIMAT) 2.5 MCG/ACT AERS, Inhale 2 puffs into the lungs daily., Disp: 4 g, Rfl: 5      Assessment:         ICD-10-CM    1. IPF (idiopathic pulmonary fibrosis) (HCC)  J84.112 ANA      Rheumatoid Factor      Cyclic citrul peptide antibody, IgG      QuantiFERON-TB Gold Plus      Alpha-1 antitrypsin phenotype      Pulmonary function test      Pulse oximetry, overnight      CANCELED: Pulse oximetry, overnight     2. ILD (interstitial lung disease) ([place])  J84.9       3. Pulmonary emphysema, unspecified emphysema type ([place])  J43.9 Alpha-1 antitrypsin phenotype     4. Combined pulmonary fibrosis and emphysema (CPFE) (HCC)  J43.9 ANA    J84.10 Rheumatoid Factor      Cyclic citrul peptide antibody, IgG      QuantiFERON-TB Gold Plus      Alpha-1 antitrypsin phenotype      Tiotropium Bromide Monohydrate (SPIRIVA RESPIMAT) 2.5 MCG/ACT AERS      Pulmonary function test      Pulse oximetry, overnight      CANCELED: Pulse oximetry, overnight       I think as combined pulmonary fibrosis and emphysema.  The pulmonary fibrosis very likely is IPF.  I do not know what to make of that or not but I would call this is IPF given classic UIP, progression, age greater than 65, Caucasian ethnicity.  Antifibrotic's are indicated because of UIP/IPF and also progression.  He did have failure to thrive.  Therefore we will lower the dose of antifibrotic.  He did have an MI in the summer 2023 therefore we will do pirfenidone as first-line.  For his emphysema we will do Spiriva.  I did indicate to him that given changes in cancer chemotherapy and prognosis that it is probably worth trying  antifibrotic's in order to slow down the progression of ILD.  He and his wife and son are in alignment with this plan.  Currently does not need portable oxygen for simple walking of 150 feet x 3.  Will test his night saturations.  Also test autoimmune profile.   Did discuss about clinical trials as a care option.  We might have an upcoming study that is looking at osteoporosis prevalence in IPF patients.  He has never had a DEXA bone scan.  If the study is available and he is eligible.  He is willing to participate.    Plan:     Patient Instructions        ICD-10-CM    1. IPF (idiopathic pulmonary fibrosis) ([place])  J84.112       2. ILD (interstitial lung disease) ([place])  J84.9       3. Pulmonary emphysema, unspecified emphysema type ([place])  J43.9       4. Combined pulmonary fibrosis and emphysema (CPFE) (HCC)  J43.9      J84.10           # Pulmonary fibrosis - I believe you have IPF [although you have a history of a non-] based on the fact age greater than 66, Caucasian ethnicity, classic features on CT scan and progression in the last 3-1/2 years.  And previous smoking history   Plan - Check ANA, rheumatoid factor, CCP, QuantiFERON gold -Start pirfenidone low-dose protocol [2 pills 3 times daily max]             -Start with 1 pill 3 times daily with food for 1 week and then escalate to 2 pills 3 times daily and stay on that dose             -Take 5-6 hours apart             -Apply sunscreen when you go out             -Drink plenty of water   #emphysema -Do alpha-1 antitrypsin blood work -Start Spiriva Respimat high-dose 2 puffs once daily   # Combined emphysema and pulmonary fibrosis -  - Do full pulmonary function test at some point in the next few months -Check overnight oxygen at home pulse oximetry -Do simple walking desaturation test             -If these are normal you can return the oxygen system                         - you can return portable o2 based on walk test  07/17/2022     #Followup  - 6-8 weeks with nurse practitioner to review progress or he can see me if I have a opening available -12 weeks with Dr. Chase Caller 30-minute visit             -7 score and walking desaturation test at follow-up   ( Level 05 visit: Estb 40-54 min in  visit type: on-site physical face to visit  in total care time and counseling or/and coordination of care by this undersigned MD - Dr Brand Males. This includes one or more of the following on this same day 07/17/2022: pre-charting, chart review, note writing, documentation discussion of test results, diagnostic or treatment recommendations, prognosis, risks and benefits of management options, instructions, education, compliance or risk-factor reduction. It excludes time spent by the [place] or office staff in the care of the patient. Actual time 50 min)     SIGNATURE      Dr. Brand Males, M.D., F.C.C.P,  Pulmonary and Critical Care Medicine Staff Physician, [place] Director - Interstitial Lung Disease  Program  Pulmonary [place] [facility] [place], [place], [address]   Pager: [phone], If no answer or between  15:00h - 7:00h: call [phone] Telephone: [phone]   5:20 PM 07/17/2022        Patient Instructions by Brand Males, MD at 07/17/2022 1:30 PM  Author: Brand Males, MD Author Type: Physician Filed: 07/17/2022  2:12 PM  Note Status: Addendum Mickle MalloryMickle Mallory Not Required Encounter Date: 07/17/2022  Editor: Brand Males, MD (Physician)      Prior Versions: 1. Brand Males, MD (Physician) at 07/17/2022  2:09 PM - Addendum   2. Brand Males, MD (Physician) at 07/17/2022  2:06 PM - Addendum   3. Brand Males, MD (Physician) at 07/17/2022  2:04 PM - Signed      ICD-10-CM    1. IPF (idiopathic pulmonary fibrosis) ([place])  J84.112       2. ILD (interstitial lung disease) ([place])  J84.9       3. Pulmonary  emphysema, unspecified emphysema type ([place])  J43.9       4. Combined pulmonary fibrosis and emphysema (CPFE) (HCC)  J43.9      J84.10           # Pulmonary fibrosis - I believe you have IPF [although you have a history of a non-] based on the fact age greater than 90, Caucasian ethnicity, classic features on CT scan and progression in the last 3-1/2 years.  And previous smoking history   Plan - Check ANA, rheumatoid factor, CCP, QuantiFERON gold -Start pirfenidone low-dose protocol [2 pills 3 times daily max]             -Start with 1 pill 3 times daily with food for 1 week and then escalate to 2 pills 3 times daily and stay on that dose             -Take 5-6 hours apart             -Apply sunscreen when you go out             -Drink plenty of water   #emphysema -Do alpha-1 antitrypsin blood work -Start Spiriva Respimat high-dose 2 puffs once daily   # Combined emphysema and pulmonary fibrosis -  - Do full pulmonary function test at some point in the next few months -Check overnight oxygen at home pulse oximetry -Do simple walking desaturation test             -If these are normal you can return the oxygen system                         - you can return portable o2 based on walk test 07/17/2022     #Followup  - 6-8 weeks with nurse practitioner to review progress or he can see me if I have a opening available -12 weeks with Dr. Chase Caller 30-minute visit             -7 score and walking desaturation test at follow-up       Orthostatic Vitals Recorded in This Encounter   07/17/2022 1318     Patient Position: Sitting  BP Location: Left Arm  Cuff Size: Normal   Instructions      ICD-10-CM    1. IPF (idiopathic pulmonary fibrosis) ([place])  J84.112       2. ILD (interstitial lung disease) ([place])  J84.9       3. Pulmonary emphysema, unspecified emphysema type ([place])  J43.9       4. Combined pulmonary fibrosis and emphysema (CPFE) (HCC)  J43.9      J84.10           #  Pulmonary fibrosis - I believe you have IPF [although you have a history of a non-] based on the fact age greater than 36, Caucasian ethnicity, classic features on CT scan and progression in the last 3-1/2 years.  And previous smoking history   Plan - Check ANA, rheumatoid factor, CCP, QuantiFERON gold -Start pirfenidone low-dose protocol [2 pills 3 times daily max]             -  Start with 1 pill 3 times daily with food for 1 week and then escalate to 2 pills 3 times daily and stay on that dose             -Take 5-6 hours apart             -Apply sunscreen when you go out             -Drink plenty of water   #emphysema -Do alpha-1 antitrypsin blood work -Start Spiriva Respimat high-dose 2 puffs once daily   # Combined emphysema and pulmonary fibrosis -  - Do full pulmonary function test at some point in the next few months -Check overnight oxygen at home pulse oximetry -Do simple walking desaturation test             -If these are normal you can return the oxygen system                         - you can return portable o2 based on walk test 07/17/2022     #Followup  - 6-8 weeks with nurse practitioner to review progress or he can see me if I have a opening available -12 weeks with Dr. Chase Caller 30-minute visit             -7 score and walking desaturation test at follow-up

## 2022-08-14 NOTE — Telephone Encounter (Signed)
Would recommend he restart and monitor his symptoms if he was tolerating it well up until then. If the dizziness recurs, he should call us and we can decide on alternative inhaler therapy. Thanks.

## 2022-08-14 NOTE — Telephone Encounter (Signed)
Called and spoke with patient, advised him of the recommendations per Marland Kitchen NP.  He verbalized understanding.  Nothing further needed.

## 2022-08-15 ENCOUNTER — Other Ambulatory Visit: Payer: Self-pay

## 2022-08-17 ENCOUNTER — Encounter (HOSPITAL_BASED_OUTPATIENT_CLINIC_OR_DEPARTMENT_OTHER): Payer: Self-pay

## 2022-08-17 ENCOUNTER — Other Ambulatory Visit: Payer: Self-pay

## 2022-08-17 ENCOUNTER — Emergency Department (HOSPITAL_BASED_OUTPATIENT_CLINIC_OR_DEPARTMENT_OTHER)
Admission: EM | Admit: 2022-08-17 | Discharge: 2022-08-17 | Discharge status: Against Medical Advice | Payer: Medicare Other | Attending: Emergency Medicine | Admitting: Emergency Medicine

## 2022-08-17 DIAGNOSIS — R6883 Chills (without fever): Secondary | ICD-10-CM | POA: Diagnosis not present

## 2022-08-17 DIAGNOSIS — Z8616 Personal history of COVID-19: Secondary | ICD-10-CM | POA: Insufficient documentation

## 2022-08-17 DIAGNOSIS — Z20822 Contact with and (suspected) exposure to covid-19: Secondary | ICD-10-CM | POA: Insufficient documentation

## 2022-08-17 DIAGNOSIS — Z5321 Procedure and treatment not carried out due to patient leaving prior to being seen by health care provider: Secondary | ICD-10-CM | POA: Diagnosis not present

## 2022-08-17 LAB — RESP PANEL BY RT-PCR (RSV, FLU A&B, COVID)  RVPGX2
Influenza A by PCR: NEGATIVE
Influenza B by PCR: NEGATIVE
Resp Syncytial Virus by PCR: NEGATIVE
SARS Coronavirus 2 by RT PCR: NEGATIVE

## 2022-08-17 NOTE — Progress Notes (Signed)
[facility] OFFICE PROGRESS NOTE  [name], MD [address] [place] [address]  DIAGNOSIS: Extensive stage (T3, N2, M1 a) small cell lung cancer presented with obstructive left upper lobe lung mass in addition to mediastinal lymphadenopathy and malignant left pleural effusion as well as pleural-based metastasis as well as suspicious liver metastasis diagnosed in June 2023.     PRIOR THERAPY: None  CURRENT THERAPY:  Palliative systemic chemotherapy with carboplatin initiated for AUC of 4 on day 1 and etoposide 80 Mg/M2 on days 1, 2 and 3 with Cosela before the chemotherapy and Imfinzi 1500 Mg IV on day 1 every 3 weeks.  Status post 8 cycles.   Status post 5 cycles.  Starting from cycle #5 the patient will be on maintenance treatment with Imfinzi 1500 Mg IV every 4 weeks.     INTERVAL HISTORY: Pedro Pope. 81 y.o. male returns to the clinic today for a follow-up visit accompanied by his wife. The patient is feeling fairly well today without any complaints except for he has had a rash and dry skin for approximately 2 months which is likely secondary to his immunotherapy.  He has been using Benadryl and his rash is improved "a good bit" compared to prior but he does continue to have some dry scattered skin lesions.  The Benadryl does help although it does dry him out. He also has a dry itchy patch near his groin/testicle.  He does have a history of requiring nystatin for fungal infection.   A few days ago, the patient had an exposure to someone with COVID-19.  He developed chills and performed a home COVID test which was positive.  He went to the emergency room to be checked for COVID-19 which was negative.  The patient's chills disappeared and he is over all feeling well today.  Denies any fever, night sweats, or unexplained weight loss.  He denies any cough, shortness of breath, or hemoptysis.  Denies any nausea, vomiting, diarrhea, or constipation.  Denies any  headaches.  Supposed to have a restaging CT scan performed but is not been scheduled till next week on 08/25/2022.  He is here today for evaluation repeat blood work before undergoing cycle #9.     MEDICAL HISTORY: Past Medical History:  Diagnosis Date   Anosmia    CAD (coronary artery disease), native coronary artery    a. 10/29/2013 antlat STEMI s/p DES to pLAD; residual 80 % proximal and 60% mid LCx diease   Chronic headaches    Dressler's syndrome (HCC)    a. placed on colchicine    ED (erectile dysfunction)    Elevated TSH    Fatty liver    Gout    Ischemic cardiomyopathy    a. 2D ECHO 11/04/14 w/ EF 40-45% with LV WMA, G2DD, mild MR, mild LA dilation, mod TR.   PAF (paroxysmal atrial fibrillation) (HCC)    a. Dx on 11/03/14 admission. Not placed on longterm AC due to DAPT w/ receent DES. Will plan for outpt heart monitor    Pre-diabetes    a. HgA1c 6.1 in 10/2014   Prostate cancer [facility])    s/p prostatectomy and XRT   Rheumatic fever 1947    ALLERGIES:  is allergic to penicillins, clindamycin, corn syrup [glucose], and aspirin adult low [aspirin].  MEDICATIONS:  Current Outpatient Medications  Medication Sig Dispense Refill   acetaminophen (TYLENOL) 500 MG tablet Take 500 mg by mouth every 6 (six) hours as needed for mild  pain or moderate pain.     ascorbic acid (VITAMIN C) 500 MG tablet Take 1,000 mg by mouth daily.     Blood Pressure Monitor KIT For daily blood pressure monitoring;  Arm Cuff 1 each 0   carvedilol (COREG) 3.125 MG tablet Take 1 tablet (3.125 mg total) by mouth 2 (two) times daily with a meal. (Patient taking differently: Take 3.125 mg by mouth 1 day or 1 dose.) 180 tablet 0   Cholecalciferol (VITAMIN D3) 25 MCG (1000 UT) CAPS Take 1 capsule by mouth daily.     colchicine 0.6 MG tablet Take 1 tablet twice a day by oral route.     escitalopram (LEXAPRO) 5 MG tablet Take 5 mg by mouth daily.     hydrOXYzine (ATARAX) 10 MG tablet Take 1 tablet (10 mg total) by  mouth 3 (three) times daily as needed. 30 tablet 0   lidocaine-prilocaine (EMLA) cream Apply 1 Application topically as needed. 30 g 2   nystatin (MYCOSTATIN/NYSTOP) powder Apply 1 Application topically 3 (three) times daily. 15 g 0   oxyCODONE (ROXICODONE) 5 MG immediate release tablet Take 0.5-1 tablets (2.5-5 mg total) by mouth every 4 (four) hours as needed. 30 tablet 0   PROCTO-MED HC 2.5 % rectal cream Apply topically.     triamcinolone cream (KENALOG) 0.1 % Apply 1 Application topically 2 (two) times daily. 80 g 0   umeclidinium bromide (INCRUSE ELLIPTA) 62.5 MCG/ACT AEPB Inhale 1 puff into the lungs daily. 30 each 5   No current facility-administered medications for this visit.    SURGICAL HISTORY:  Past Surgical History:  Procedure Laterality Date   CORONARY ANGIOPLASTY WITH STENT PLACEMENT  10/30/14   STEMI s/p DES to proximal LAD; residual 80 % proximal and 60% mid LCx diease   IR IMAGING GUIDED PORT INSERTION  03/12/2022   LEFT HEART CATHETERIZATION WITH CORONARY ANGIOGRAM N/A 10/30/2014   Procedure: LEFT HEART CATHETERIZATION WITH CORONARY ANGIOGRAM;  Surgeon: Runell Gess, MD;  Location: [facility] CATH LAB;  Service: Cardiovascular;  Laterality: N/A;   PERCUTANEOUS CORONARY STENT INTERVENTION (PCI-S)  10/30/2014   Procedure: PERCUTANEOUS CORONARY STENT INTERVENTION (PCI-S);  Surgeon: Runell Gess, MD;  Location: [facility] CATH LAB;  Service: Cardiovascular;;   PROSTATECTOMY     THORACENTESIS Left 12/31/2021   Procedure: THORACENTESIS;  Surgeon: Lorin Glass, MD;  Location: [facility] ENDOSCOPY;  Service: Pulmonary;  Laterality: Left;   TONSILLECTOMY     VIDEO ASSISTED THORACOSCOPY (VATS)/DECORTICATION Left 01/21/2022   Procedure: VIDEO ASSISTED THORACOSCOPY (VATS)/DECORTICATION;  Surgeon: Corliss Skains, MD;  Location: [facility] OR;  Service: Thoracic;  Laterality: Left;    REVIEW OF SYSTEMS:   Review of Systems  Constitutional: Negative for appetite change, chills, fatigue, fever and  unexpected weight change.  HENT:   Negative for mouth sores, nosebleeds, sore throat and trouble swallowing.   Eyes: Negative for eye problems and icterus.  Respiratory: Positive for baseline dyspnea on exertion.  Negative for cough, hemoptysis, and wheezing.   Cardiovascular: Negative for chest pain and leg swelling.  Gastrointestinal: Negative for abdominal pain, constipation, diarrhea, nausea and vomiting.  Genitourinary: Negative for bladder incontinence, difficulty urinating, dysuria, frequency and hematuria.   Musculoskeletal: Negative for back pain, gait problem, neck pain and neck stiffness.  Skin: Positive for scattered rash and itching.  Positive for dry skin. Neurological: Negative for dizziness, extremity weakness, gait problem, headaches, light-headedness and seizures.  Hematological: Negative for adenopathy. Does not bruise/bleed easily.  Psychiatric/Behavioral: Negative for confusion, depression and sleep  disturbance. The patient is not nervous/anxious.     PHYSICAL EXAMINATION:  Blood pressure 115/66, pulse 65, temperature 97.6 F (36.4 C), temperature source Temporal, resp. rate 16, height 5\' 8"  (1.727 m), weight 135 lb 3.2 oz (61.3 kg), SpO2 100 %.  ECOG PERFORMANCE STATUS: 1  Physical Exam  Constitutional: Oriented to person, place, and time and thin appearing male, and in no distress.  HENT:  Head: Normocephalic and atraumatic.  Mouth/Throat: Oropharynx is clear and moist. No oropharyngeal exudate.  Eyes: Conjunctivae are normal. Right eye exhibits no discharge. Left eye exhibits no discharge. No scleral icterus.  Neck: Normal range of motion. Neck supple.  Cardiovascular: Normal rate, regular rhythm, normal heart sounds and intact distal pulses.   Pulmonary/Chest: Effort normal. Decreased breath sounds in base of the left lung. No respiratory distress. No wheezes. No rales.  Abdominal:  Exhibits no distension  Musculoskeletal: Normal range of motion. Exhibits no  edema.  Lymphadenopathy:    No cervical adenopathy.  Neurological: Alert and oriented to person, place, and time. Exhibits normal muscle tone. Coordination normal. In wheelchair  Skin: Skin is warm and dry.  Positive for dry skin and a few scattered skin lesions on his forearm.  Positive for dry patch or skin near his left testicle.  Not diaphoretic. No erythema. No pallor.  Psychiatric: Mood, memory and judgment normal.  Vitals reviewed.  LABORATORY DATA: Lab Results  Component Value Date   WBC 6.8 08/19/2022   HGB 11.8 (L) 08/19/2022   HCT 35.4 (L) 08/19/2022   MCV 88.3 08/19/2022   PLT 205 08/19/2022      Chemistry      Component Value Date/Time   NA 138 08/19/2022 0810   K 3.8 08/19/2022 0810   CL 105 08/19/2022 0810   CO2 28 08/19/2022 0810   BUN 18 08/19/2022 0810   CREATININE 0.77 08/19/2022 0810      Component Value Date/Time   CALCIUM 9.3 08/19/2022 0810   ALKPHOS 57 08/19/2022 0810   AST 16 08/19/2022 0810   ALT 8 08/19/2022 0810   BILITOT 0.3 08/19/2022 0810       RADIOGRAPHIC STUDIES:  No results found.   ASSESSMENT/PLAN:  This is a very pleasant 81 year old Caucasian male diagnosed with extensive stage (T3, N2, M1 a) small cell lung cancer.  He presented with an obstructive left upper lobe lung mass in addition to mediastinal lymphadenopathy.  He also has a malignant left pleural effusion and pleural-based metastases.  He also has a suspicious liver metastasis.  He was diagnosed in June 2023.    The patient is currently undergoing slightly reduced dose systemic chemotherapy with carboplatin for an AUC of 4 on day 1, etoposide 80 mg per metered squared on days 1 2, and 3 with Imfinzi on day 1 every 3 weeks.  He is status post 8 cycles.  Starting from cycle #5, the patient started maintenance immunotherapy with Imfinzi IV every 4 weeks.   Labs were reviewed. Recommend he proceed with cycle #9 today as scheduled.   We will See him back for follow-up visit  in 4 weeks for evaluation repeat blood work before starting cycle #10.  However, I will call him back sooner if any concerning findings on his repeat CT scan next week to schedule for 08/25/2022.  Regarding his rash, I sent him a prescription for Kenalog cream to use locally on any skin lesions that are itchy.  In general, he does have dry skin and I encouraged him to  use lotion/moisturizer.  For the area near the groin that is itchy, the patient does have a history of fungal infection.  I did refill his nystatin powder.  If no improvement I recommended that he see his primary care provider.  I sent a prescription for Atarax to use instead of Benadryl for itching 3 times a day as needed.  He knows not to take Atarax with Benadryl to take 1 or the other.  We reviewed side effects which may cause drowsiness.  The patient was advised to call immediately if she has any concerning symptoms in the interval. The patient voices understanding of current disease status and treatment options and is in agreement with the current care plan. All questions were answered. The patient knows to call the clinic with any problems, questions or concerns. We can certainly see the patient much sooner if necessary      No orders of the defined types were placed in this encounter.     The total time spent in the appointment was 20-29 minutes.   Yomar Mejorado L Juanita Devincent, PA-C 08/19/22

## 2022-08-17 NOTE — ED Triage Notes (Signed)
Patient here POV from Home.  Endorses Chills that began Yesterday. Chills and 100.8 Fever have since subsided. No Symptoms currently.   Recent contact with COVID-19 Positive Individual. Currently under Immunotherapy.   NAD Noted during Triage. A&Ox4. Gcs 15. BIB Wheelchair.

## 2022-08-19 ENCOUNTER — Inpatient Hospital Stay: Payer: Medicare Other

## 2022-08-19 ENCOUNTER — Inpatient Hospital Stay: Payer: Medicare Other | Attending: Internal Medicine

## 2022-08-19 ENCOUNTER — Other Ambulatory Visit: Payer: Self-pay

## 2022-08-19 ENCOUNTER — Inpatient Hospital Stay (HOSPITAL_BASED_OUTPATIENT_CLINIC_OR_DEPARTMENT_OTHER): Payer: Medicare Other | Admitting: Physician Assistant

## 2022-08-19 VITALS — BP 115/66 | HR 65 | Temp 97.6°F | Resp 16 | Ht 68.0 in | Wt 135.2 lb

## 2022-08-19 VITALS — BP 108/53 | HR 58 | Temp 97.7°F | Resp 16

## 2022-08-19 DIAGNOSIS — Z5112 Encounter for antineoplastic immunotherapy: Secondary | ICD-10-CM

## 2022-08-19 DIAGNOSIS — C787 Secondary malignant neoplasm of liver and intrahepatic bile duct: Secondary | ICD-10-CM | POA: Diagnosis not present

## 2022-08-19 DIAGNOSIS — C3482 Malignant neoplasm of overlapping sites of left bronchus and lung: Secondary | ICD-10-CM | POA: Insufficient documentation

## 2022-08-19 DIAGNOSIS — B49 Unspecified mycosis: Secondary | ICD-10-CM | POA: Diagnosis not present

## 2022-08-19 DIAGNOSIS — C782 Secondary malignant neoplasm of pleura: Secondary | ICD-10-CM | POA: Insufficient documentation

## 2022-08-19 DIAGNOSIS — Z95828 Presence of other vascular implants and grafts: Secondary | ICD-10-CM

## 2022-08-19 DIAGNOSIS — Z79899 Other long term (current) drug therapy: Secondary | ICD-10-CM | POA: Diagnosis not present

## 2022-08-19 DIAGNOSIS — L299 Pruritus, unspecified: Secondary | ICD-10-CM

## 2022-08-19 DIAGNOSIS — R21 Rash and other nonspecific skin eruption: Secondary | ICD-10-CM

## 2022-08-19 LAB — CMP (CANCER CENTER ONLY)
ALT: 8 U/L (ref 0–44)
AST: 16 U/L (ref 15–41)
Albumin: 3.5 g/dL (ref 3.5–5.0)
Alkaline Phosphatase: 57 U/L (ref 38–126)
Anion gap: 5 (ref 5–15)
BUN: 18 mg/dL (ref 8–23)
CO2: 28 mmol/L (ref 22–32)
Calcium: 9.3 mg/dL (ref 8.9–10.3)
Chloride: 105 mmol/L (ref 98–111)
Creatinine: 0.77 mg/dL (ref 0.61–1.24)
GFR, Estimated: >60 mL/min
Glucose, Bld: 127 mg/dL — ABNORMAL HIGH (ref 70–99)
Potassium: 3.8 mmol/L (ref 3.5–5.1)
Sodium: 138 mmol/L (ref 135–145)
Total Bilirubin: 0.3 mg/dL (ref 0.3–1.2)
Total Protein: 6.3 g/dL — ABNORMAL LOW (ref 6.5–8.1)

## 2022-08-19 LAB — CBC WITH DIFFERENTIAL (CANCER CENTER ONLY)
Abs Immature Granulocytes: 0.03 K/uL (ref 0.00–0.07)
Basophils Absolute: 0 K/uL (ref 0.0–0.1)
Basophils Relative: 0 %
Eosinophils Absolute: 0.2 K/uL (ref 0.0–0.5)
Eosinophils Relative: 3 %
HCT: 35.4 % — ABNORMAL LOW (ref 39.0–52.0)
Hemoglobin: 11.8 g/dL — ABNORMAL LOW (ref 13.0–17.0)
Immature Granulocytes: 0 %
Lymphocytes Relative: 23 %
Lymphs Abs: 1.6 K/uL (ref 0.7–4.0)
MCH: 29.4 pg (ref 26.0–34.0)
MCHC: 33.3 g/dL (ref 30.0–36.0)
MCV: 88.3 fL (ref 80.0–100.0)
Monocytes Absolute: 0.9 K/uL (ref 0.1–1.0)
Monocytes Relative: 14 %
Neutro Abs: 4 K/uL (ref 1.7–7.7)
Neutrophils Relative %: 60 %
Platelet Count: 205 K/uL (ref 150–400)
RBC: 4.01 MIL/uL — ABNORMAL LOW (ref 4.22–5.81)
RDW: 14.6 % (ref 11.5–15.5)
WBC Count: 6.8 K/uL (ref 4.0–10.5)
nRBC: 0 % (ref 0.0–0.2)

## 2022-08-19 LAB — TSH: TSH: 4.071 u[IU]/mL (ref 0.350–4.500)

## 2022-08-19 MED ORDER — SODIUM CHLORIDE 0.9% FLUSH
10.0000 mL | INTRAVENOUS | Status: DC | PRN
  Administered 2022-08-19: 10 mL

## 2022-08-19 MED ORDER — SODIUM CHLORIDE 0.9% FLUSH
10.0000 mL | Freq: Once | INTRAVENOUS | Status: AC
  Administered 2022-08-19: 10 mL

## 2022-08-19 MED ORDER — HEPARIN SOD (PORK) LOCK FLUSH 100 UNIT/ML IV SOLN
500.0000 [IU] | Freq: Once | INTRAVENOUS | Status: AC | PRN
  Administered 2022-08-19: 500 [IU]

## 2022-08-19 MED ORDER — TRIAMCINOLONE ACETONIDE 0.1 % EX CREA: 1.0000 | TOPICAL_CREAM | Freq: Two times a day (BID) | CUTANEOUS | 0 refills | Status: AC

## 2022-08-19 MED ORDER — SODIUM CHLORIDE 0.9 % IV SOLN: Freq: Once | INTRAVENOUS | Status: AC

## 2022-08-19 MED ORDER — NYSTATIN 100000 UNIT/GM EX POWD: 1.0000 | Freq: Three times a day (TID) | CUTANEOUS | 0 refills | Status: DC

## 2022-08-19 MED ORDER — HYDROXYZINE HCL 10 MG PO TABS: 10.0000 mg | ORAL_TABLET | Freq: Three times a day (TID) | ORAL | 0 refills | Status: DC | PRN

## 2022-08-19 MED ORDER — SODIUM CHLORIDE 0.9 % IV SOLN
1500.0000 mg | Freq: Once | INTRAVENOUS | Status: AC
  Administered 2022-08-19: 1500 mg via INTRAVENOUS
  Filled 2022-08-19: qty 30

## 2022-08-19 NOTE — Patient Instructions (Signed)
[facility] ONCOLOGY  Discharge Instructions: Thank you for choosing [facility] to provide your oncology and hematology care.   If you have a lab appointment with the [facility], please go directly to the [facility] and check in at the registration area.   Wear comfortable clothing and clothing appropriate for easy access to any Portacath or PICC line.   We strive to give you quality time with your provider. You may need to reschedule your appointment if you arrive late (15 or more minutes).  Arriving late affects you and other patients whose appointments are after yours.  Also, if you miss three or more appointments without notifying the office, you may be dismissed from the clinic at the provider's discretion.      For prescription refill requests, have your pharmacy contact our office and allow 72 hours for refills to be completed.    Today you received the following chemotherapy and/or immunotherapy agents: durvalumab  (imfinzi)   To help prevent nausea and vomiting after your treatment, we encourage you to take your nausea medication as directed.  BELOW ARE SYMPTOMS THAT SHOULD BE REPORTED IMMEDIATELY: *FEVER GREATER THAN 100.4 F (38 C) OR HIGHER *CHILLS OR SWEATING *NAUSEA AND VOMITING THAT IS NOT CONTROLLED WITH YOUR NAUSEA MEDICATION *UNUSUAL SHORTNESS OF BREATH *UNUSUAL BRUISING OR BLEEDING *URINARY PROBLEMS (pain or burning when urinating, or frequent urination) *BOWEL PROBLEMS (unusual diarrhea, constipation, pain near the anus) TENDERNESS IN MOUTH AND THROAT WITH OR WITHOUT PRESENCE OF ULCERS (sore throat, sores in mouth, or a toothache) UNUSUAL RASH, SWELLING OR PAIN  UNUSUAL VAGINAL DISCHARGE OR ITCHING   Items with * indicate a potential emergency and should be followed up as soon as possible or go to the Emergency Department if any problems should occur.  Please show the CHEMOTHERAPY ALERT CARD or IMMUNOTHERAPY ALERT CARD at  check-in to the Emergency Department and triage nurse.  Should you have questions after your visit or need to cancel or reschedule your appointment, please contact [facility]  Dept: [phone]  and follow the prompts.  Office hours are 8:00 a.m. to 4:30 p.m. Monday - Friday. Please note that voicemails left after 4:00 p.m. may not be returned until the following business day.  We are closed weekends and major holidays. You have access to a nurse at all times for urgent questions. Please call the main number to the clinic Dept: [phone] and follow the prompts.   For any non-urgent questions, you may also contact your provider using MyChart. We now offer e-Visits for anyone 24 and older to request care online for non-urgent symptoms. For details visit [email or web address].   Also download the MyChart app! Go to the app store, search "MyChart", open the app, select [facility], and log in with your MyChart username and password.

## 2022-08-20 LAB — T4: T4, Total: 4.5 ug/dL (ref 4.5–12.0)

## 2022-08-22 ENCOUNTER — Telehealth: Payer: Self-pay | Admitting: Internal Medicine

## 2022-08-22 NOTE — Telephone Encounter (Signed)
Called patient regarding upcoming January and February appointments, spoke with patient's son. Patient will be notified.

## 2022-08-25 ENCOUNTER — Ambulatory Visit (HOSPITAL_COMMUNITY)
Admission: RE | Admit: 2022-08-25 | Discharge: 2022-08-25 | Disposition: A | Discharge status: Home / Self Care | Payer: Medicare Other | Source: Ambulatory Visit | Attending: Internal Medicine | Admitting: Internal Medicine

## 2022-08-25 DIAGNOSIS — K769 Liver disease, unspecified: Secondary | ICD-10-CM | POA: Diagnosis not present

## 2022-08-25 DIAGNOSIS — J439 Emphysema, unspecified: Secondary | ICD-10-CM | POA: Diagnosis not present

## 2022-08-25 DIAGNOSIS — C3490 Malignant neoplasm of unspecified part of unspecified bronchus or lung: Secondary | ICD-10-CM

## 2022-08-25 DIAGNOSIS — N200 Calculus of kidney: Secondary | ICD-10-CM | POA: Diagnosis not present

## 2022-08-25 DIAGNOSIS — J479 Bronchiectasis, uncomplicated: Secondary | ICD-10-CM | POA: Diagnosis not present

## 2022-08-25 MED ORDER — SODIUM CHLORIDE (PF) 0.9 % IJ SOLN
INTRAMUSCULAR | Status: AC
  Filled 2022-08-25: qty 50

## 2022-08-25 MED ORDER — HEPARIN SOD (PORK) LOCK FLUSH 100 UNIT/ML IV SOLN
INTRAVENOUS | Status: AC
  Filled 2022-08-25: qty 5

## 2022-08-25 MED ORDER — IOHEXOL 300 MG/ML  SOLN
100.0000 mL | Freq: Once | INTRAMUSCULAR | Status: AC | PRN
  Administered 2022-08-25: 100 mL via INTRAVENOUS

## 2022-08-25 MED ORDER — HEPARIN SOD (PORK) LOCK FLUSH 100 UNIT/ML IV SOLN
500.0000 [IU] | Freq: Once | INTRAVENOUS | Status: AC
  Administered 2022-08-25: 500 [IU] via INTRAVENOUS

## 2022-08-26 ENCOUNTER — Other Ambulatory Visit: Payer: Self-pay

## 2022-08-27 ENCOUNTER — Telehealth: Payer: Self-pay | Admitting: Physician Assistant

## 2022-08-27 NOTE — Telephone Encounter (Signed)
I saw the patient last week for an appointment but his CT scan was not scheduled until this week.  I was calling the patient to review the scan results.  I reviewed the scan results with Dr. Julien Nordmann.  The scan is stable except for slightly enlarging lymph node in the gastrohepatic ligament.  Dr. Julien Nordmann recommends close interval follow-up with a repeat CT scan in 2 months.  I called the patient and reviewed this with him.  He expressed understanding.

## 2022-08-29 ENCOUNTER — Telehealth: Payer: Self-pay | Admitting: Internal Medicine

## 2022-08-29 NOTE — Telephone Encounter (Signed)
Overnight pulse oximetry done on [employer] 07/17/2005 -> the records say 2006 which is 18 years ago  In this he desaturated less than equal to 88% for 28 minutes and 52 seconds  Plan - Please have them send the correct date for this ON all

## 2022-09-01 NOTE — Telephone Encounter (Signed)
Community message sent to [employer] to have them fix the date and have the results sent back to [place]. Will update once receive a response.

## 2022-09-01 NOTE — Telephone Encounter (Signed)
Message received back from [employer]: Pedro Pope  Pedro Pope, Pedro Pope, Pedro Pope I'm sending an email for these results to be faxed over, can you provide the correct fax number please?   Will await fax.

## 2022-09-03 ENCOUNTER — Telehealth: Payer: Self-pay

## 2022-09-03 DIAGNOSIS — M79672 Pain in left foot: Secondary | ICD-10-CM | POA: Diagnosis not present

## 2022-09-03 NOTE — Telephone Encounter (Signed)
Patient walked in office wanting Dr.Berry to know he takes Carvedilol 3.125 mg once a day.Stated PCP Dr.Russo decreased to once a day back in 2016 due to low pulse.Stated he forgot to mention to Dr.Berry at his last visit.Message sent to [facility].

## 2022-09-07 ENCOUNTER — Other Ambulatory Visit: Payer: Self-pay

## 2022-09-10 MED ORDER — CARVEDILOL 3.125 MG PO TABS: 3.1250 mg | ORAL_TABLET | Freq: Every day | ORAL | Status: DC

## 2022-09-10 NOTE — Telephone Encounter (Signed)
Medication list changed to reflect how pt is taking medication.

## 2022-09-10 NOTE — Addendum Note (Signed)
Addended by: Beatrix Fetters on: 09/10/2022 08:54 AM   Modules accepted: Orders

## 2022-09-11 ENCOUNTER — Ambulatory Visit: Payer: Medicare Other | Admitting: Nurse Practitioner

## 2022-09-11 NOTE — Telephone Encounter (Signed)
Correct fax number to our office was provided to [employer] 1/15. MR, please advise if you have received corrected results on pt with date fixed.

## 2022-09-12 ENCOUNTER — Other Ambulatory Visit: Payer: Self-pay | Admitting: Physician Assistant

## 2022-09-12 ENCOUNTER — Ambulatory Visit (HOSPITAL_BASED_OUTPATIENT_CLINIC_OR_DEPARTMENT_OTHER): Payer: Medicare Other | Admitting: Physician Assistant

## 2022-09-12 DIAGNOSIS — Z95828 Presence of other vascular implants and grafts: Secondary | ICD-10-CM | POA: Diagnosis not present

## 2022-09-12 DIAGNOSIS — C3482 Malignant neoplasm of overlapping sites of left bronchus and lung: Secondary | ICD-10-CM

## 2022-09-12 DIAGNOSIS — L089 Local infection of the skin and subcutaneous tissue, unspecified: Secondary | ICD-10-CM

## 2022-09-12 MED ORDER — DOXYCYCLINE HYCLATE 100 MG PO TABS: 100.0000 mg | ORAL_TABLET | Freq: Two times a day (BID) | ORAL | 0 refills | Status: DC

## 2022-09-12 NOTE — Progress Notes (Signed)
[facility] OFFICE PROGRESS NOTE  Shon Baton, MD [place] [address]  DIAGNOSIS: Extensive stage (T3, N2, M1 a) small cell lung cancer presented with obstructive left upper lobe lung mass in addition to mediastinal lymphadenopathy and malignant left pleural effusion as well as pleural-based metastasis as well as suspicious liver metastasis diagnosed in June 2023.   PRIOR THERAPY: None   CURRENT THERAPY:   Palliative systemic chemotherapy with carboplatin initiated for AUC of 4 on day 1 and etoposide 80 Mg/M2 on days 1, 2 and 3 with Cosela before the chemotherapy and Imfinzi 1500 Mg IV on day 1 every 3 weeks.  Status post 9 cycles.   Status post 5 cycles.  Starting from cycle #5 the patient will be on maintenance treatment with Imfinzi 1500 Mg IV every 4 weeks.    INTERVAL HISTORY: Pedro Pope. 82 y.o. male returns visit today for a walk-in appointment accompanied by his wife.  The patient mentions that few weeks ago he had his port accessed 3-4 times in a short window with infusion, labs, and CT scan.  He states one of the times his port was accessed he experienced pain.  His port has been uncomfortable since that time.  He had a bandage over it and had not looked at the overlying skin over the course of the last 2 weeks with the onset of his symptoms.  He recently remove the bandage and the skin over the Port-A-Cath site is mild to moderately erythematous without any swelling, warmth, or drainage.  The skin is intact.  Overall this is a thin male and there is not a lot of skin/subcutaneous fat over the port-a-cath.  Patient denies rubbing, bumping, or falling on his port.  He denies any systemic symptoms such as fever, chills, or night sweats.  He is feeling well overall today.  He is allergic to clindamycin and penicillin.  Lifting his arms above his head exacerbates the pain such as putting his shirt on.  Otherwise the patient feels well and is not having any  new symptoms today.   MEDICAL HISTORY: Past Medical History:  Diagnosis Date   Anosmia    CAD (coronary artery disease), native coronary artery    a. 10/29/2013 antlat STEMI s/p DES to pLAD; residual 80 % proximal and 60% mid LCx diease   Chronic headaches    Dressler's syndrome (HCC)    a. placed on colchicine    ED (erectile dysfunction)    Elevated TSH    Fatty liver    Gout    Ischemic cardiomyopathy    a. 2D ECHO 11/04/14 w/ EF 40-45% with LV WMA, G2DD, mild MR, mild LA dilation, mod TR.   PAF (paroxysmal atrial fibrillation) ([facility])    a. Dx on 11/03/14 admission. Not placed on longterm AC due to DAPT w/ receent DES. Will plan for outpt heart monitor    Pre-diabetes    a. HgA1c 6.1 in 10/2014   Prostate cancer [facility])    s/p prostatectomy and XRT   Rheumatic fever 1947    ALLERGIES:  is allergic to penicillins, clindamycin, corn syrup [glucose], and aspirin adult low [aspirin].  MEDICATIONS:  Current Outpatient Medications  Medication Sig Dispense Refill   acetaminophen (TYLENOL) 500 MG tablet Take 500 mg by mouth every 6 (six) hours as needed for mild pain or moderate pain.     ascorbic acid (VITAMIN C) 500 MG tablet Take 1,000 mg by mouth daily.  Blood Pressure Monitor KIT For daily blood pressure monitoring;  Arm Cuff 1 each 0   carvedilol (COREG) 3.125 MG tablet Take 1 tablet (3.125 mg total) by mouth daily. Take 3.125 mg daily     Cholecalciferol (VITAMIN D3) 25 MCG (1000 UT) CAPS Take 1 capsule by mouth daily.     colchicine 0.6 MG tablet Take 1 tablet twice a day by oral route.     doxycycline (VIBRA-TABS) 100 MG tablet Take 1 tablet (100 mg total) by mouth 2 (two) times daily. 20 tablet 0   escitalopram (LEXAPRO) 5 MG tablet Take 5 mg by mouth daily.     hydrOXYzine (ATARAX) 10 MG tablet Take 1 tablet (10 mg total) by mouth 3 (three) times daily as needed. 30 tablet 0   lidocaine-prilocaine (EMLA) cream Apply 1 Application topically as needed. 30 g 2   nystatin  (MYCOSTATIN/NYSTOP) powder Apply 1 Application topically 3 (three) times daily. 15 g 0   oxyCODONE (ROXICODONE) 5 MG immediate release tablet Take 0.5-1 tablets (2.5-5 mg total) by mouth every 4 (four) hours as needed. 30 tablet 0   PROCTO-MED HC 2.5 % rectal cream Apply topically.     triamcinolone cream (KENALOG) 0.1 % Apply 1 Application topically 2 (two) times daily. 80 g 0   umeclidinium bromide (INCRUSE ELLIPTA) 62.5 MCG/ACT AEPB Inhale 1 puff into the lungs daily. 30 each 5   No current facility-administered medications for this visit.    SURGICAL HISTORY:  Past Surgical History:  Procedure Laterality Date   CORONARY ANGIOPLASTY WITH STENT PLACEMENT  10/30/14   STEMI s/p DES to proximal LAD; residual 80 % proximal and 60% mid LCx diease   IR IMAGING GUIDED PORT INSERTION  03/12/2022   LEFT HEART CATHETERIZATION WITH CORONARY ANGIOGRAM N/A 10/30/2014   Procedure: LEFT HEART CATHETERIZATION WITH CORONARY ANGIOGRAM;  Surgeon: Lorretta Harp, MD;  Location[facility] CATH LAB;  Service: Cardiovascular;  Laterality: N/A;   PERCUTANEOUS CORONARY STENT INTERVENTION (PCI-S)  10/30/2014   Procedure: PERCUTANEOUS CORONARY STENT INTERVENTION (PCI-S);  Surgeon: Lorretta Harp, MD;  Location: [facility] CATH LAB;  Service: Cardiovascular;;   PROSTATECTOMY     THORACENTESIS Left 12/31/2021   Procedure: THORACENTESIS;  Surgeon: Candee Furbish, MD;  Location: [facility] ENDOSCOPY;  Service: Pulmonary;  Laterality: Left;   TONSILLECTOMY     VIDEO ASSISTED THORACOSCOPY (VATS)/DECORTICATION Left 01/21/2022   Procedure: VIDEO ASSISTED THORACOSCOPY (VATS)/DECORTICATION;  Surgeon: Lajuana Matte, MD;  Location: [facility] OR;  Service: Thoracic;  Laterality: Left;    REVIEW OF SYSTEMS:   Review of Systems  Constitutional: Negative for appetite change, chills, fatigue, fever and unexpected weight change.  HENT: Negative for mouth sores, nosebleeds, sore throat and trouble swallowing.   Eyes: Negative for eye problems and  icterus.  Respiratory: Positive for baseline dyspnea on exertion.  Negative for cough, hemoptysis, and wheezing.  Cardiovascular: Negative for chest pain and leg swelling.  Gastrointestinal: Negative for abdominal pain, constipation, diarrhea, nausea and vomiting.  Genitourinary: Negative for bladder incontinence, difficulty urinating, dysuria, frequency and hematuria.   Musculoskeletal: Negative for back pain, gait problem, neck pain and neck stiffness.  Skin: Positive for mild erythema over port-a-cath site with telangiectasias Neurological: Negative for dizziness, extremity weakness, gait problem, headaches, light-headedness and seizures.  Hematological: Negative for adenopathy. Does not bruise/bleed easily.  Psychiatric/Behavioral: Negative for confusion, depression and sleep disturbance. The patient is not nervous/anxious.     PHYSICAL EXAMINATION:  There were no vitals taken for this visit.  ECOG PERFORMANCE  STATUS: 1  Physical Exam  Constitutional: Oriented to person, place, and time and thin appearing male, and in no distress.  HENT:  Head: Normocephalic and atraumatic.  Mouth/Throat: Oropharynx is clear and moist. No oropharyngeal exudate.  Eyes: Conjunctivae are normal. Right eye exhibits no discharge. Left eye exhibits no discharge. No scleral icterus.  Neck: Normal range of motion. Neck supple.  Cardiovascular: Normal rate, regular rhythm, normal heart sounds and intact distal pulses.   Pulmonary/Chest: Effort normal. Decreased breath sounds in base of the left lung. No respiratory distress. No wheezes. No rales.  Abdominal:  Exhibits no distension  Musculoskeletal: Normal range of motion. Exhibits no edema.  Lymphadenopathy:    No cervical adenopathy.  Neurological: Alert and oriented to person, place, and time. Exhibits normal muscle tone. Coordination normal.  Skin: Skin is warm and dry. mild-moderate erythema and telangiectasia over port-a-cath. No warmth, swelling,  drainage. The skin is intact, although there is not a lot of subcutaneous fat over the port-a-cath. diaphoretic. No erythema. No pallor.  Psychiatric: Mood, memory and judgment normal.  Vitals reviewed.  LABORATORY DATA: Lab Results  Component Value Date   WBC 6.8 08/19/2022   HGB 11.8 (L) 08/19/2022   HCT 35.4 (L) 08/19/2022   MCV 88.3 08/19/2022   PLT 205 08/19/2022      Chemistry      Component Value Date/Time   NA 138 08/19/2022 0810   K 3.8 08/19/2022 0810   CL 105 08/19/2022 0810   CO2 28 08/19/2022 0810   BUN 18 08/19/2022 0810   CREATININE 0.77 08/19/2022 0810      Component Value Date/Time   CALCIUM 9.3 08/19/2022 0810   ALKPHOS 57 08/19/2022 0810   AST 16 08/19/2022 0810   ALT 8 08/19/2022 0810   BILITOT 0.3 08/19/2022 0810       RADIOGRAPHIC STUDIES:  CT Chest W Contrast  Result Date: 08/26/2022 CLINICAL DATA:  Non-small-cell lung cancer * Tracking Code: BO * EXAM: CT CHEST, ABDOMEN, AND PELVIS WITH CONTRAST TECHNIQUE: Multidetector CT imaging of the chest, abdomen and pelvis was performed following the standard protocol during bolus administration of intravenous contrast. RADIATION DOSE REDUCTION: This exam was performed according to the departmental dose-optimization program which includes automated exposure control, adjustment of the mA and/or kV according to patient size and/or use of iterative reconstruction technique. CONTRAST:  157mL OMNIPAQUE IOHEXOL 300 MG/ML  SOLN COMPARISON:  06/19/2022 FINDINGS: CT CHEST FINDINGS Cardiovascular: Right upper chest port. Heart is nonenlarged. No pericardial effusion. Normal caliber thoracic aorta with scattered vascular calcifications. Coronary artery calcifications are seen. Calcifications along the mitral valve annulus as well. Mediastinum/Nodes: There is no specific abnormal lymph node enlargement present in the axillary region. Small bilateral hilar lymph nodes are seen which are nonpathologic by size criteria. Small  mediastinal nodes are identified as well, unchanged from previous and not pathologically enlarged. Normal caliber thoracic aorta. Lungs/Pleura: Once again there are areas of peripheral interstitial septal thickening, honeycombing and traction bronchiectasis consistent with areas of fibrosis greatest along the lower lung zones greater than upper. Underlying centrilobular and paraseptal emphysematous changes are also identified. No pneumothorax or effusion. No new consolidation or dominant lung nodule. Musculoskeletal: Mild degenerative changes along the spine. CT ABDOMEN PELVIS FINDINGS Hepatobiliary: Fatty liver infiltration. Patent portal vein. No enhancing liver lesion. Gallbladder is nondilated. No biliary ductal dilatation. Pancreas: Preserved pancreatic parenchyma.  No enhancing mass. Spleen: The spleen is nonenlarged.  Preserved enhancement. Adrenals/Urinary Tract: Adrenal glands are preserved. Mild bilateral renal atrophy.  There is a nonobstructing stone along the lower portion of the right kidney measuring 6 mm. No collecting system dilatation. Preserved contours of the urinary bladder. Stomach/Bowel: On this non oral contrast exam the large bowel is of normal course and caliber with scattered stool. Normal appendix in the right lower quadrant. The stomach and small bowel are nondilated. Slightly atypical course of the duodenal but again no wall thickening, dilatation or obstruction. Vascular/Lymphatic: Diffuse atherosclerotic changes identified along the aorta and branch vessels. There are some linear areas of calcification along lumen of the infrarenal abdominal aorta, possible areas of short segment non flow-limiting dissection or penetrating ulcers. No aneurysm. IVC is preserved. There is a prominent lymph node along the gastrohepatic ligament region which is increasing in size today on series 2, image 59 measuring 12 by 9 mm. Previously this would have measured only 7 x 5 mm. This is a worrisome  finding. Reproductive: Again changes of prostatectomy. No soft tissue mass. The central area of enhancement along the surgical bed is stable on image 125 of series 2, nonspecific. Surgical clips are seen. Other: Scattered degenerative changes seen of the spine and pelvis. Musculoskeletal: Scattered degenerative changes of the spine. IMPRESSION: Interval development of an abnormal lymph node along the gastrohepatic ligament. This would have a differential including neoplasm or reactive. Please correlate with clinical findings. Further workup as clinically indicated such as a PET-CT. Tiny dome liver lesion in segment 7 on the prior is not well seen today. No new liver lesion. Again changes of pulmonary fibrosis and emphysematous changes. Nonobstructing right-sided renal stone. Surgical changes from prior prostatectomy. Electronically Signed   By: Jill Side M.D.   On: 08/26/2022 12:21   CT Abdomen Pelvis W Contrast  Result Date: 08/26/2022 CLINICAL DATA:  Non-small-cell lung cancer * Tracking Code: BO * EXAM: CT CHEST, ABDOMEN, AND PELVIS WITH CONTRAST TECHNIQUE: Multidetector CT imaging of the chest, abdomen and pelvis was performed following the standard protocol during bolus administration of intravenous contrast. RADIATION DOSE REDUCTION: This exam was performed according to the departmental dose-optimization program which includes automated exposure control, adjustment of the mA and/or kV according to patient size and/or use of iterative reconstruction technique. CONTRAST:  161mL OMNIPAQUE IOHEXOL 300 MG/ML  SOLN COMPARISON:  06/19/2022 FINDINGS: CT CHEST FINDINGS Cardiovascular: Right upper chest port. Heart is nonenlarged. No pericardial effusion. Normal caliber thoracic aorta with scattered vascular calcifications. Coronary artery calcifications are seen. Calcifications along the mitral valve annulus as well. Mediastinum/Nodes: There is no specific abnormal lymph node enlargement present in the axillary  region. Small bilateral hilar lymph nodes are seen which are nonpathologic by size criteria. Small mediastinal nodes are identified as well, unchanged from previous and not pathologically enlarged. Normal caliber thoracic aorta. Lungs/Pleura: Once again there are areas of peripheral interstitial septal thickening, honeycombing and traction bronchiectasis consistent with areas of fibrosis greatest along the lower lung zones greater than upper. Underlying centrilobular and paraseptal emphysematous changes are also identified. No pneumothorax or effusion. No new consolidation or dominant lung nodule. Musculoskeletal: Mild degenerative changes along the spine. CT ABDOMEN PELVIS FINDINGS Hepatobiliary: Fatty liver infiltration. Patent portal vein. No enhancing liver lesion. Gallbladder is nondilated. No biliary ductal dilatation. Pancreas: Preserved pancreatic parenchyma.  No enhancing mass. Spleen: The spleen is nonenlarged.  Preserved enhancement. Adrenals/Urinary Tract: Adrenal glands are preserved. Mild bilateral renal atrophy. There is a nonobstructing stone along the lower portion of the right kidney measuring 6 mm. No collecting system dilatation. Preserved contours of the urinary bladder.  Stomach/Bowel: On this non oral contrast exam the large bowel is of normal course and caliber with scattered stool. Normal appendix in the right lower quadrant. The stomach and small bowel are nondilated. Slightly atypical course of the duodenal but again no wall thickening, dilatation or obstruction. Vascular/Lymphatic: Diffuse atherosclerotic changes identified along the aorta and branch vessels. There are some linear areas of calcification along lumen of the infrarenal abdominal aorta, possible areas of short segment non flow-limiting dissection or penetrating ulcers. No aneurysm. IVC is preserved. There is a prominent lymph node along the gastrohepatic ligament region which is increasing in size today on series 2, image 59  measuring 12 by 9 mm. Previously this would have measured only 7 x 5 mm. This is a worrisome finding. Reproductive: Again changes of prostatectomy. No soft tissue mass. The central area of enhancement along the surgical bed is stable on image 125 of series 2, nonspecific. Surgical clips are seen. Other: Scattered degenerative changes seen of the spine and pelvis. Musculoskeletal: Scattered degenerative changes of the spine. IMPRESSION: Interval development of an abnormal lymph node along the gastrohepatic ligament. This would have a differential including neoplasm or reactive. Please correlate with clinical findings. Further workup as clinically indicated such as a PET-CT. Tiny dome liver lesion in segment 7 on the prior is not well seen today. No new liver lesion. Again changes of pulmonary fibrosis and emphysematous changes. Nonobstructing right-sided renal stone. Surgical changes from prior prostatectomy. Electronically Signed   By: Jill Side M.D.   On: 08/26/2022 12:21     ASSESSMENT/PLAN:  This is a very pleasant 82 year old Caucasian male diagnosed with extensive stage (T3, N2, M1 a) small cell lung cancer.  He presented with an obstructive left upper lobe lung mass in addition to mediastinal lymphadenopathy.  He also has a malignant left pleural effusion and pleural-based metastases.  He also has a suspicious liver metastasis.  He was diagnosed in June 2023.    The patient is currently undergoing slightly reduced dose systemic chemotherapy with carboplatin for an AUC of 4 on day 1, etoposide 80 mg per metered squared on days 1 2, and 3 with Imfinzi on day 1 every 3 weeks.  He is status post 9 cycles.  Starting from cycle #5, the patient started maintenance immunotherapy with Imfinzi IV every 4 weeks.   The patient is having some tenderness and erythema over his port site.  The patient is a very thin appearing male.  Denies any systemic symptoms of infection.  Aside from the area being erythematous,  there is no swelling, drainage, or warmth.  Unclear if this site is irritated due to some type of friction between the Port-A-Cath and his skin since he is so thin and the port is fairly superficial. There is no intact skin or skin breakdown. Unclear if there is some type of superficial skin infection after being accessed several times a few weeks ago.  The patient is allergic to penicillins.  Sent in a prescription for doxycycline to take 100 mg twice daily for the next 10 days.   For, I did discuss warning symptoms with the patient as we approach the weekend.  Should he develops any systemic symptoms such as fevers, chills, night sweats, worsening erythema, worsening pain, weakness, skin breakdown, warmth, or swelling, the patient should be seen immediately.  We will reassess the area on Monday for his next appointment.  If the patient continues to have similar symptoms, the neck step would involve referring him to  interventional radiology for their assessment.  The patient has an appointment to see Dr. Julien Nordmann on Monday.  I would recommend if the site continues to be erythematous on Monday to do his lab work via phlebotomy. I have sent a scheduling message for Monday to change his lab/flush to a lab visit to avoid delays Monday morning. I called the patient to let him know this and left a voicemail.   The patient is feeling well overall today and is afebrile.  Will see him back for follow-up visit on Monday 09/15/22 for his next cycle of treatment.  The patient was advised to call immediately if he has any concerning symptoms in the interval. The patient voices understanding of current disease status and treatment options and is in agreement with the current care plan. All questions were answered. The patient knows to call the clinic with any problems, questions or concerns. We can certainly see the patient much sooner if necessary     No orders of the defined types were placed in this  encounter.    The total time spent in the appointment was 20-29 minutes  Esdras Delair L Chardonay [place], PA-C 09/12/22

## 2022-09-15 ENCOUNTER — Inpatient Hospital Stay: Payer: Medicare Other

## 2022-09-15 ENCOUNTER — Encounter: Payer: Self-pay | Admitting: Internal Medicine

## 2022-09-15 ENCOUNTER — Other Ambulatory Visit: Payer: Medicare Other

## 2022-09-15 ENCOUNTER — Inpatient Hospital Stay (HOSPITAL_BASED_OUTPATIENT_CLINIC_OR_DEPARTMENT_OTHER): Payer: Medicare Other | Admitting: Internal Medicine

## 2022-09-15 VITALS — BP 120/52 | HR 62 | Temp 97.8°F | Resp 16 | Ht 68.0 in | Wt 136.4 lb

## 2022-09-15 VITALS — BP 119/54 | HR 58 | Temp 98.1°F | Resp 16

## 2022-09-15 DIAGNOSIS — C3482 Malignant neoplasm of overlapping sites of left bronchus and lung: Secondary | ICD-10-CM

## 2022-09-15 DIAGNOSIS — Z5112 Encounter for antineoplastic immunotherapy: Secondary | ICD-10-CM | POA: Diagnosis not present

## 2022-09-15 DIAGNOSIS — Z79899 Other long term (current) drug therapy: Secondary | ICD-10-CM | POA: Diagnosis not present

## 2022-09-15 DIAGNOSIS — C787 Secondary malignant neoplasm of liver and intrahepatic bile duct: Secondary | ICD-10-CM | POA: Diagnosis not present

## 2022-09-15 DIAGNOSIS — C782 Secondary malignant neoplasm of pleura: Secondary | ICD-10-CM | POA: Diagnosis not present

## 2022-09-15 LAB — CBC WITH DIFFERENTIAL (CANCER CENTER ONLY)
Abs Immature Granulocytes: 0.01 10*3/uL (ref 0.00–0.07)
Basophils Absolute: 0 10*3/uL (ref 0.0–0.1)
Basophils Relative: 1 %
Eosinophils Absolute: 0.4 10*3/uL (ref 0.0–0.5)
Eosinophils Relative: 7 %
HCT: 38.8 % — ABNORMAL LOW (ref 39.0–52.0)
Hemoglobin: 12.6 g/dL — ABNORMAL LOW (ref 13.0–17.0)
Immature Granulocytes: 0 %
Lymphocytes Relative: 32 %
Lymphs Abs: 2 10*3/uL (ref 0.7–4.0)
MCH: 28.8 pg (ref 26.0–34.0)
MCHC: 32.5 g/dL (ref 30.0–36.0)
MCV: 88.8 fL (ref 80.0–100.0)
Monocytes Absolute: 0.7 10*3/uL (ref 0.1–1.0)
Monocytes Relative: 10 %
Neutro Abs: 3.2 10*3/uL (ref 1.7–7.7)
Neutrophils Relative %: 50 %
Platelet Count: 162 10*3/uL (ref 150–400)
RBC: 4.37 MIL/uL (ref 4.22–5.81)
RDW: 15.2 % (ref 11.5–15.5)
WBC Count: 6.4 10*3/uL (ref 4.0–10.5)
nRBC: 0 % (ref 0.0–0.2)

## 2022-09-15 LAB — CMP (CANCER CENTER ONLY)
ALT: 7 U/L (ref 0–44)
AST: 17 U/L (ref 15–41)
Albumin: 3.5 g/dL (ref 3.5–5.0)
Alkaline Phosphatase: 66 U/L (ref 38–126)
Anion gap: 4 — ABNORMAL LOW (ref 5–15)
BUN: 19 mg/dL (ref 8–23)
CO2: 28 mmol/L (ref 22–32)
Calcium: 9.2 mg/dL (ref 8.9–10.3)
Chloride: 109 mmol/L (ref 98–111)
Creatinine: 0.9 mg/dL (ref 0.61–1.24)
GFR, Estimated: >60 mL/min (ref >60)
Glucose, Bld: 90 mg/dL (ref 70–99)
Potassium: 3.9 mmol/L (ref 3.5–5.1)
Sodium: 141 mmol/L (ref 135–145)
Total Bilirubin: 0.4 mg/dL (ref 0.3–1.2)
Total Protein: 6.3 g/dL — ABNORMAL LOW (ref 6.5–8.1)

## 2022-09-15 MED ORDER — SODIUM CHLORIDE 0.9 % IV SOLN: Freq: Once | INTRAVENOUS | Status: AC

## 2022-09-15 MED ORDER — SODIUM CHLORIDE 0.9 % IV SOLN
1500.0000 mg | Freq: Once | INTRAVENOUS | Status: AC
  Administered 2022-09-15: 1500 mg via INTRAVENOUS
  Filled 2022-09-15: qty 30

## 2022-09-15 NOTE — Progress Notes (Signed)
[facility] [phone]   [phone]  PROGRESS NOTE FOR TELEMEDICINE VISITS  [place], [place] [address]  I connected withNAME@ on 09/15/22 at 10:15 AM EST by video enabled telemedicine visit and verified that I am speaking with the correct person using two identifiers.   I discussed the limitations, risks, security and privacy concerns of performing an evaluation and management service by telemedicine and the availability of in-person appointments. I also discussed with the patient that there may be a patient responsible charge related to this service. The patient expressed understanding and agreed to proceed.  Other persons participating in the visit and their role in the encounter:  wife and son  Patient's location: [facility] exam room Provider's location: [facility] office.  DIAGNOSIS: Extensive stage (T3, N2, M1b) small cell lung cancer presented with obstructive left upper lobe lung mass in addition to mediastinal lymphadenopathy and malignant left pleural effusion as well as pleural-based metastasis as well as suspicious liver metastasis diagnosed in June 2023.   PRIOR THERAPY: None   CURRENT THERAPY: Palliative systemic chemotherapy with carboplatin initiated for AUC of 4 on day 1 and etoposide 80 Mg/M2 on days 1, 2 and 3 with Cosela before the chemotherapy and Imfinzi 1500 Mg IV on day 1 every 3 weeks.  Status post 9 cycles.  Starting from cycle #5 the patient will be on maintenance treatment with Imfinzi 1500 Mg IV every 4 weeks.  INTERVAL HISTORY: Pedro Pope. 82 y.o. male returns to the clinic today for follow-up visit accompanied by his son and wife.  The patient is feeling fine today with no concerning complaints except for some itching and irritation around the Port-A-Cath site.  He was seen by Rubin Payor, the physician assistant few days ago and started treatment with doxycycline.   He has some improvement in that area.  He denied having any current chest pain, shortness of breath, cough or hemoptysis.  He has no nausea, vomiting, diarrhea or constipation.  He has no headache or visual changes.  He denied having any recent weight loss or night sweats.  He has no fever or chills.  He continues to tolerate his treatment with immunotherapy fairly well.  He is here today for evaluation with repeat CT scan of the chest, abdomen and pelvis for restaging of his disease.   MEDICAL HISTORY: Past Medical History:  Diagnosis Date   Anosmia    CAD (coronary artery disease), native coronary artery    a. 10/29/2013 antlat STEMI s/p DES to pLAD; residual 80 % proximal and 60% mid LCx diease   Chronic headaches    Dressler's syndrome (HCC)    a. placed on colchicine    ED (erectile dysfunction)    Elevated TSH    Fatty liver    Gout    Ischemic cardiomyopathy    a. 2D ECHO 11/04/14 w/ EF 40-45% with LV WMA, G2DD, mild MR, mild LA dilation, mod TR.   PAF (paroxysmal atrial fibrillation) ([place])    a. Dx on 11/03/14 admission. Not placed on longterm AC due to DAPT w/ receent DES. Will plan for outpt heart monitor    Pre-diabetes    a. HgA1c 6.1 in 10/2014   Prostate cancer [facility])    s/p prostatectomy and XRT   Rheumatic fever 1947    ALLERGIES:  is allergic to penicillins, clindamycin, corn syrup [glucose], and aspirin adult low [aspirin].  MEDICATIONS:  Current Outpatient Medications  Medication Sig Dispense Refill   acetaminophen (TYLENOL) 500 MG tablet Take 500 mg by mouth every 6 (six) hours as needed for mild pain or moderate pain.     ascorbic acid (VITAMIN C) 500 MG tablet Take 1,000 mg by mouth daily.     Blood Pressure Monitor KIT For daily blood pressure monitoring;  Arm Cuff 1 each 0   carvedilol (COREG) 3.125 MG tablet Take 1 tablet (3.125 mg total) by mouth daily. Take 3.125 mg daily     Cholecalciferol (VITAMIN D3) 25 MCG (1000 UT) CAPS Take 1 capsule by mouth daily.      colchicine 0.6 MG tablet Take 1 tablet twice a day by oral route.     doxycycline (VIBRA-TABS) 100 MG tablet Take 1 tablet (100 mg total) by mouth 2 (two) times daily. 20 tablet 0   escitalopram (LEXAPRO) 5 MG tablet Take 5 mg by mouth daily.     hydrOXYzine (ATARAX) 10 MG tablet Take 1 tablet (10 mg total) by mouth 3 (three) times daily as needed. 30 tablet 0   lidocaine-prilocaine (EMLA) cream Apply 1 Application topically as needed. 30 g 2   nystatin (MYCOSTATIN/NYSTOP) powder Apply 1 Application topically 3 (three) times daily. 15 g 0   oxyCODONE (ROXICODONE) 5 MG immediate release tablet Take 0.5-1 tablets (2.5-5 mg total) by mouth every 4 (four) hours as needed. 30 tablet 0   PROCTO-MED HC 2.5 % rectal cream Apply topically.     triamcinolone cream (KENALOG) 0.1 % Apply 1 Application topically 2 (two) times daily. 80 g 0   umeclidinium bromide (INCRUSE ELLIPTA) 62.5 MCG/ACT AEPB Inhale 1 puff into the lungs daily. 30 each 5   No current facility-administered medications for this visit.    SURGICAL HISTORY:  Past Surgical History:  Procedure Laterality Date   CORONARY ANGIOPLASTY WITH STENT PLACEMENT  10/30/14   STEMI s/p DES to proximal LAD; residual 80 % proximal and 60% mid LCx diease   IR IMAGING GUIDED PORT INSERTION  03/12/2022   LEFT HEART CATHETERIZATION WITH CORONARY ANGIOGRAM N/A 10/30/2014   Procedure: LEFT HEART CATHETERIZATION WITH CORONARY ANGIOGRAM;  Surgeon: Lorretta Harp, MD;  Location: [facility] CATH LAB;  Service: Cardiovascular;  Laterality: N/A;   PERCUTANEOUS CORONARY STENT INTERVENTION (PCI-S)  10/30/2014   Procedure: PERCUTANEOUS CORONARY STENT INTERVENTION (PCI-S);  Surgeon: Lorretta Harp, MD;  Location: [facility] CATH LAB;  Service: Cardiovascular;;   PROSTATECTOMY     THORACENTESIS Left 12/31/2021   Procedure: THORACENTESIS;  Surgeon: Candee Furbish, MD;  Location: [facility] ENDOSCOPY;  Service: Pulmonary;  Laterality: Left;   TONSILLECTOMY     VIDEO ASSISTED THORACOSCOPY  (VATS)/DECORTICATION Left 01/21/2022   Procedure: VIDEO ASSISTED THORACOSCOPY (VATS)/DECORTICATION;  Surgeon: Lajuana Matte, MD;  Location: [facility];  Service: Thoracic;  Laterality: Left;    REVIEW OF SYSTEMS:  Constitutional: negative Eyes: negative Ears, nose, mouth, throat, and face: negative Respiratory: negative Cardiovascular: negative Gastrointestinal: negative Genitourinary:negative Integument/breast: negative Hematologic/lymphatic: negative Musculoskeletal:negative Neurological: negative Behavioral/Psych: negative Endocrine: negative Allergic/Immunologic: negative   ECOG PERFORMANCE STATUS: 1 - Symptomatic but completely ambulatory  Blood pressure (!) 120/52, pulse 62, temperature 97.8 F (36.6 C), temperature source Temporal, resp. rate 16, height 5\' 8"  (1.727 m), weight 136 lb 6.4 oz (61.9 kg), SpO2 100 %.  LABORATORY DATA: Lab Results  Component Value Date   WBC 6.4 09/15/2022   HGB 12.6 (L) 09/15/2022   HCT 38.8 (L) 09/15/2022   MCV 88.8 09/15/2022   PLT 162 09/15/2022  Chemistry      Component Value Date/Time   NA 141 09/15/2022 0951   K 3.9 09/15/2022 0951   CL 109 09/15/2022 0951   CO2 28 09/15/2022 0951   BUN 19 09/15/2022 0951   CREATININE 0.90 09/15/2022 0951      Component Value Date/Time   CALCIUM 9.2 09/15/2022 0951   ALKPHOS 66 09/15/2022 0951   AST 17 09/15/2022 0951   ALT 7 09/15/2022 0951   BILITOT 0.4 09/15/2022 0951       RADIOGRAPHIC STUDIES: CT Chest W Contrast  Result Date: 08/26/2022 CLINICAL DATA:  Non-small-cell lung cancer * Tracking Code: BO * EXAM: CT CHEST, ABDOMEN, AND PELVIS WITH CONTRAST TECHNIQUE: Multidetector CT imaging of the chest, abdomen and pelvis was performed following the standard protocol during bolus administration of intravenous contrast. RADIATION DOSE REDUCTION: This exam was performed according to the departmental dose-optimization program which includes automated exposure control, adjustment of  the mA and/or kV according to patient size and/or use of iterative reconstruction technique. CONTRAST:  173mL OMNIPAQUE IOHEXOL 300 MG/ML  SOLN COMPARISON:  06/19/2022 FINDINGS: CT CHEST FINDINGS Cardiovascular: Right upper chest port. Heart is nonenlarged. No pericardial effusion. Normal caliber thoracic aorta with scattered vascular calcifications. Coronary artery calcifications are seen. Calcifications along the mitral valve annulus as well. Mediastinum/Nodes: There is no specific abnormal lymph node enlargement present in the axillary region. Small bilateral hilar lymph nodes are seen which are nonpathologic by size criteria. Small mediastinal nodes are identified as well, unchanged from previous and not pathologically enlarged. Normal caliber thoracic aorta. Lungs/Pleura: Once again there are areas of peripheral interstitial septal thickening, honeycombing and traction bronchiectasis consistent with areas of fibrosis greatest along the lower lung zones greater than upper. Underlying centrilobular and paraseptal emphysematous changes are also identified. No pneumothorax or effusion. No new consolidation or dominant lung nodule. Musculoskeletal: Mild degenerative changes along the spine. CT ABDOMEN PELVIS FINDINGS Hepatobiliary: Fatty liver infiltration. Patent portal vein. No enhancing liver lesion. Gallbladder is nondilated. No biliary ductal dilatation. Pancreas: Preserved pancreatic parenchyma.  No enhancing mass. Spleen: The spleen is nonenlarged.  Preserved enhancement. Adrenals/Urinary Tract: Adrenal glands are preserved. Mild bilateral renal atrophy. There is a nonobstructing stone along the lower portion of the right kidney measuring 6 mm. No collecting system dilatation. Preserved contours of the urinary bladder. Stomach/Bowel: On this non oral contrast exam the large bowel is of normal course and caliber with scattered stool. Normal appendix in the right lower quadrant. The stomach and small bowel are  nondilated. Slightly atypical course of the duodenal but again no wall thickening, dilatation or obstruction. Vascular/Lymphatic: Diffuse atherosclerotic changes identified along the aorta and branch vessels. There are some linear areas of calcification along lumen of the infrarenal abdominal aorta, possible areas of short segment non flow-limiting dissection or penetrating ulcers. No aneurysm. IVC is preserved. There is a prominent lymph node along the gastrohepatic ligament region which is increasing in size today on series 2, image 59 measuring 12 by 9 mm. Previously this would have measured only 7 x 5 mm. This is a worrisome finding. Reproductive: Again changes of prostatectomy. No soft tissue mass. The central area of enhancement along the surgical bed is stable on image 125 of series 2, nonspecific. Surgical clips are seen. Other: Scattered degenerative changes seen of the spine and pelvis. Musculoskeletal: Scattered degenerative changes of the spine. IMPRESSION: Interval development of an abnormal lymph node along the gastrohepatic ligament. This would have a differential including neoplasm or reactive. Please correlate  with clinical findings. Further workup as clinically indicated such as a PET-CT. Tiny dome liver lesion in segment 7 on the prior is not well seen today. No new liver lesion. Again changes of pulmonary fibrosis and emphysematous changes. Nonobstructing right-sided renal stone. Surgical changes from prior prostatectomy. Electronically Signed   By: Jill Side M.D.   On: 08/26/2022 12:21   CT Abdomen Pelvis W Contrast  Result Date: 08/26/2022 CLINICAL DATA:  Non-small-cell lung cancer * Tracking Code: BO * EXAM: CT CHEST, ABDOMEN, AND PELVIS WITH CONTRAST TECHNIQUE: Multidetector CT imaging of the chest, abdomen and pelvis was performed following the standard protocol during bolus administration of intravenous contrast. RADIATION DOSE REDUCTION: This exam was performed according to the  departmental dose-optimization program which includes automated exposure control, adjustment of the mA and/or kV according to patient size and/or use of iterative reconstruction technique. CONTRAST:  148mL OMNIPAQUE IOHEXOL 300 MG/ML  SOLN COMPARISON:  06/19/2022 FINDINGS: CT CHEST FINDINGS Cardiovascular: Right upper chest port. Heart is nonenlarged. No pericardial effusion. Normal caliber thoracic aorta with scattered vascular calcifications. Coronary artery calcifications are seen. Calcifications along the mitral valve annulus as well. Mediastinum/Nodes: There is no specific abnormal lymph node enlargement present in the axillary region. Small bilateral hilar lymph nodes are seen which are nonpathologic by size criteria. Small mediastinal nodes are identified as well, unchanged from previous and not pathologically enlarged. Normal caliber thoracic aorta. Lungs/Pleura: Once again there are areas of peripheral interstitial septal thickening, honeycombing and traction bronchiectasis consistent with areas of fibrosis greatest along the lower lung zones greater than upper. Underlying centrilobular and paraseptal emphysematous changes are also identified. No pneumothorax or effusion. No new consolidation or dominant lung nodule. Musculoskeletal: Mild degenerative changes along the spine. CT ABDOMEN PELVIS FINDINGS Hepatobiliary: Fatty liver infiltration. Patent portal vein. No enhancing liver lesion. Gallbladder is nondilated. No biliary ductal dilatation. Pancreas: Preserved pancreatic parenchyma.  No enhancing mass. Spleen: The spleen is nonenlarged.  Preserved enhancement. Adrenals/Urinary Tract: Adrenal glands are preserved. Mild bilateral renal atrophy. There is a nonobstructing stone along the lower portion of the right kidney measuring 6 mm. No collecting system dilatation. Preserved contours of the urinary bladder. Stomach/Bowel: On this non oral contrast exam the large bowel is of normal course and caliber  with scattered stool. Normal appendix in the right lower quadrant. The stomach and small bowel are nondilated. Slightly atypical course of the duodenal but again no wall thickening, dilatation or obstruction. Vascular/Lymphatic: Diffuse atherosclerotic changes identified along the aorta and branch vessels. There are some linear areas of calcification along lumen of the infrarenal abdominal aorta, possible areas of short segment non flow-limiting dissection or penetrating ulcers. No aneurysm. IVC is preserved. There is a prominent lymph node along the gastrohepatic ligament region which is increasing in size today on series 2, image 59 measuring 12 by 9 mm. Previously this would have measured only 7 x 5 mm. This is a worrisome finding. Reproductive: Again changes of prostatectomy. No soft tissue mass. The central area of enhancement along the surgical bed is stable on image 125 of series 2, nonspecific. Surgical clips are seen. Other: Scattered degenerative changes seen of the spine and pelvis. Musculoskeletal: Scattered degenerative changes of the spine. IMPRESSION: Interval development of an abnormal lymph node along the gastrohepatic ligament. This would have a differential including neoplasm or reactive. Please correlate with clinical findings. Further workup as clinically indicated such as a PET-CT. Tiny dome liver lesion in segment 7 on the prior is not well seen  today. No new liver lesion. Again changes of pulmonary fibrosis and emphysematous changes. Nonobstructing right-sided renal stone. Surgical changes from prior prostatectomy. Electronically Signed   By: Jill Side M.D.   On: 08/26/2022 12:21    ASSESSMENT AND PLAN: This is a very pleasant 82 years old white male with Extensive stage (T3, N2, M1 a) small cell lung cancer presented with obstructive left upper lobe lung mass in addition to mediastinal lymphadenopathy and malignant left pleural effusion as well as pleural-based metastasis and suspicious  liver metastasis diagnosed in June 2023. The patient is currently undergoing systemic chemotherapy with carboplatin for AUC of 4 on day 1, etoposide 80 Mg/M2 on days 1, 2 and 3 with Imfinzi 240 Mg/M2 on the days before chemotherapy and Imfinzi 1500 Mg IV on day 1 every 3 weeks.  Status post 9 cycles.  Starting from cycle #5 he will be on maintenance treatment with immunotherapy with Imfinzi 1500 Mg IV every 4 weeks. He has been tolerating this treatment well with no concerning adverse effect except for some itching and irritation around the Port-A-Cath site. He is currently undergoing treatment with doxycycline for the suspicious inflammation around the Port-A-Cath site and he is feeling little bit better. He had repeat CT scan of the chest, abdomen and pelvis performed recently.  I personally and independently reviewed the scans and discussed results with the patient and his family. His scan showed stable disease except for interval enlargement of abnormal lymph node along the gastrohepatic ligament that currently measures 1.2 x 0.9 cm compared to 0.7 x 0.5 cm on previous scan and still concerning for disease progression. I recommended for the patient to continue his current treatment with immunotherapy and will continue to monitor this lymph node closely and consider him for palliative radiation if needed in the absence of any other areas of progression. The patient has question about a clinical trial for small cell lung cancer at [facility] and I encouraged him to see Dr. Vashti Hey to see if he is eligible for this clinical trial. Regarding the vaccination, the patient was advised to proceed with his RSV as well as COVID vaccination but 2 weeks after his treatment. He will proceed with cycle #10 today. I will see him back for follow-up visit in 4 weeks for evaluation before starting cycle #11. The patient was advised to call immediately if he has any concerning symptoms in the interval. I  discussed the assessment and treatment plan with the patient. The patient was provided an opportunity to ask questions and all were answered. The patient agreed with the plan and demonstrated an understanding of the instructions.   The patient was advised to call back or seek an in-person evaluation if the symptoms worsen or if the condition fails to improve as anticipated.  I provided 30 minutes of face-to-face video visit time during this encounter, and > 50% was spent counseling as documented under my assessment & plan.  Eilleen Kempf, MD 09/15/2022 10:21 AM  Disclaimer: This note was dictated with voice recognition software. Similar sounding words can inadvertently be transcribed and may not be corrected upon review.

## 2022-09-15 NOTE — Patient Instructions (Signed)
Agenda  Discharge Instructions: Thank you for choosing [facility] to provide your oncology and hematology care.   If you have a lab appointment with the [facility], please go directly to the [facility] and check in at the registration area.   Wear comfortable clothing and clothing appropriate for easy access to any Portacath or PICC line.   We strive to give you quality time with your provider. You may need to reschedule your appointment if you arrive late (15 or more minutes).  Arriving late affects you and other patients whose appointments are after yours.  Also, if you miss three or more appointments without notifying the office, you may be dismissed from the clinic at the provider's discretion.      For prescription refill requests, have your pharmacy contact our office and allow 72 hours for refills to be completed.    Today you received the following chemotherapy and/or immunotherapy agents: durvalumab       To help prevent nausea and vomiting after your treatment, we encourage you to take your nausea medication as directed.  BELOW ARE SYMPTOMS THAT SHOULD BE REPORTED IMMEDIATELY: *FEVER GREATER THAN 100.4 F (38 C) OR HIGHER *CHILLS OR SWEATING *NAUSEA AND VOMITING THAT IS NOT CONTROLLED WITH YOUR NAUSEA MEDICATION *UNUSUAL SHORTNESS OF BREATH *UNUSUAL BRUISING OR BLEEDING *URINARY PROBLEMS (pain or burning when urinating, or frequent urination) *BOWEL PROBLEMS (unusual diarrhea, constipation, pain near the anus) TENDERNESS IN MOUTH AND THROAT WITH OR WITHOUT PRESENCE OF ULCERS (sore throat, sores in mouth, or a toothache) UNUSUAL RASH, SWELLING OR PAIN  UNUSUAL VAGINAL DISCHARGE OR ITCHING   Items with * indicate a potential emergency and should be followed up as soon as possible or go to the Emergency Department if any problems should occur.  Please show the CHEMOTHERAPY ALERT CARD or IMMUNOTHERAPY ALERT CARD at  check-in to the Emergency Department and triage nurse.  Should you have questions after your visit or need to cancel or reschedule your appointment, please contact [facility]  Dept: [phone]  and follow the prompts.  Office hours are 8:00 a.m. to 4:30 p.m. Monday - Friday. Please note that voicemails left after 4:00 p.m. may not be returned until the following business day.  We are closed weekends and major holidays. You have access to a nurse at all times for urgent questions. Please call the main number to the clinic Dept: [phone] and follow the prompts.   For any non-urgent questions, you may also contact your provider using MyChart. We now offer e-Visits for anyone 66 and older to request care online for non-urgent symptoms. For details visit [email or web address].   Also download the MyChart app! Go to the app store, search "MyChart", open the app, select [facility], and log in with your MyChart username and password.

## 2022-09-16 ENCOUNTER — Other Ambulatory Visit: Payer: Self-pay

## 2022-09-16 ENCOUNTER — Telehealth: Payer: Medicare Other | Admitting: Physician Assistant

## 2022-09-22 ENCOUNTER — Telehealth: Payer: Self-pay | Admitting: Medical Oncology

## 2022-09-22 ENCOUNTER — Other Ambulatory Visit: Payer: Self-pay | Admitting: Physician Assistant

## 2022-09-22 ENCOUNTER — Telehealth: Payer: Self-pay

## 2022-09-22 DIAGNOSIS — Z452 Encounter for adjustment and management of vascular access device: Secondary | ICD-10-CM

## 2022-09-22 NOTE — Telephone Encounter (Signed)
This nurse received a call from this patient stating that he has completed the round of antibiotics that were prescribed to him on 09/12/22 for port a cath.  Patient states that the port does not appear to be any better. Patient would like to know what can be done at this time.  This information has been forwarded to provider for recommendation.  No further questions or concerns noted at this time.

## 2022-09-22 NOTE — Telephone Encounter (Signed)
Refaxed records for pt referral .

## 2022-09-23 ENCOUNTER — Encounter: Payer: Self-pay | Admitting: Internal Medicine

## 2022-09-26 ENCOUNTER — Other Ambulatory Visit: Payer: Self-pay

## 2022-09-29 ENCOUNTER — Telehealth: Payer: Self-pay | Admitting: *Deleted

## 2022-09-29 NOTE — Telephone Encounter (Signed)
ATC Brad ([facility]) regarding results of ONO ordered in November 2023.  Requested that the results be faxed to [phone].  Will await fax.

## 2022-09-30 ENCOUNTER — Ambulatory Visit (INDEPENDENT_AMBULATORY_CARE_PROVIDER_SITE_OTHER): Payer: Medicare Other | Admitting: Adult Health

## 2022-09-30 ENCOUNTER — Encounter: Payer: Self-pay | Admitting: Adult Health

## 2022-09-30 ENCOUNTER — Telehealth: Payer: Self-pay | Admitting: *Deleted

## 2022-09-30 ENCOUNTER — Ambulatory Visit (INDEPENDENT_AMBULATORY_CARE_PROVIDER_SITE_OTHER): Payer: Medicare Other | Admitting: Internal Medicine

## 2022-09-30 VITALS — BP 112/78 | HR 67 | Ht 68.0 in | Wt 139.8 lb

## 2022-09-30 DIAGNOSIS — C3482 Malignant neoplasm of overlapping sites of left bronchus and lung: Secondary | ICD-10-CM

## 2022-09-30 DIAGNOSIS — J8410 Pulmonary fibrosis, unspecified: Secondary | ICD-10-CM

## 2022-09-30 DIAGNOSIS — J9611 Chronic respiratory failure with hypoxia: Secondary | ICD-10-CM | POA: Diagnosis not present

## 2022-09-30 DIAGNOSIS — J849 Interstitial pulmonary disease, unspecified: Secondary | ICD-10-CM

## 2022-09-30 DIAGNOSIS — J84112 Idiopathic pulmonary fibrosis: Secondary | ICD-10-CM

## 2022-09-30 DIAGNOSIS — J432 Centrilobular emphysema: Secondary | ICD-10-CM

## 2022-09-30 MED ORDER — ALBUTEROL SULFATE HFA 108 (90 BASE) MCG/ACT IN AERS: 1.0000 | INHALATION_SPRAY | Freq: Four times a day (QID) | RESPIRATORY_TRACT | 2 refills | Status: DC | PRN

## 2022-09-30 NOTE — Patient Instructions (Addendum)
Will check with Pharmacy team regarding Esbriet status  Activity as tolerated.  Will call once Overnight oximetry test results are back.  Albuterol inhaler 1-2 puffs every 6hrs as needed for shortness of breath.  Follow up with Dr. Chase Caller in 6-8 weeks and As needed

## 2022-09-30 NOTE — Progress Notes (Unsigned)
@Patient  ID: Pedro Pope., male    DOB: July 22, 1941, 82 y.o.   MRN: [id number]  Chief Complaint  Patient presents with   Follow-up    Referring provider: Shon Baton, MD  HPI: 82 year old male seen for evaluation of  Interstitial lung disease July 17, 2022 with Dr. Chase Caller  Diagnosed with extensive stage small cell lung cancer diagnosed in June 2023 with obstructive left upper lobe lung mass in addition to mediastinal lymphadenopathy and malignant left pleural effusion as well as pleural-based metastasis and suspected liver metastasis. S/p Pleurx catheter (removed Fall 2023) followed by oncology on systemic chemo with carboplatin and immunotherapy with Imfinzi.    TEST/EVENTS :  ILD risk factors/IPF risk factors -Former smoker -Age greater than 58 and Caucasian ethnicity -Progression with classic UIP -He gives a history of Raynaud's but no autoimmune disease -No serology available -Ongoing immunotherapy for lung cancer -Associated emphysema  CT chest June 19, 2022 showed a moderate pulmonary fibrosis with apical to basal gradient, irregular peripheral interstitial opacity and traction bronchiectasis along with honeycombing at the bases.  Moderate emphysema.  Decreased left pleural effusion with associated pleural thickening  CT chest August 25, 2022 showed interval development of abnormal lymph node in the gastrohepatic ligament.  Previous liver lesion not well-seen.  No new liver lesions.  Stable pulmonary fibrosis and emphysematous changes  09/30/2022 Follow up : ILD , Extensive stage Small cell lung cancer  Patient returns for a 37-month follow-up.  Patient was seen last visit for an evaluation of interstitial lung disease.  He is undergoing treatment for extensive stage small cell lung cancer.  A CT chest has shown pulmonary fibrosis.  Felt to have IPF, CT chest shows a UIP pattern.  As above he has several risk factors for IPF.  Patient was recommended to begin  Esbriet.  He is currently awaiting insurance approval.  Patient was also tried on Spiriva and Incruse in the past but says it really did not make much difference in his breathing and they are not very affordable.  Patient is on oxygen 2 L with activity and at bedtime.  Patient says he is not been using his oxygen during the daytime feels that he really does not need it at this time.  Walk test in the office showed no significant desaturations on room air.  He had an overnight oximetry test results are pending.. Patient says overall he is doing okay.  He gets winded with heavy activities.  Denies any hemoptysis, chest pain, orthopnea or increased edema. Patient is undergoing some treatment for Port-A-Cath infection.  He is being followed by oncology and  treated with antibiotics.    Allergies  Allergen Reactions   Penicillins Anaphylaxis   Clindamycin Other (See Comments)   Corn Syrup [Glucose] Other (See Comments)    High fructose corn syrup causes gout   Aspirin Adult Low [Aspirin] Itching and Rash    Dr. [name] suspects that long term caused a rash, short term use appears to be ok    Immunization History  Administered Date(s) Administered   Fluad Quad(high Dose 65+) 05/26/2022   Influenza Split 05/20/2012, 09/01/2013   Influenza, High Dose Seasonal PF 04/23/2017   Influenza, Quadrivalent, Recombinant, Inj, Pf 04/08/2018, 07/18/2020   Influenza,inj,Quad PF,6+ Mos 11/01/2014   PFIZER Comirnaty(Gray Top)Covid-19 Tri-Sucrose Vaccine 12/16/2019, 05/18/2020   PFIZER(Purple Top)SARS-COV-2 Vaccination 10/14/2019, 11/08/2019   Pneumococcal Conjugate-13 09/01/2013, 10/01/2017   Pneumococcal Polysaccharide-23 11/01/2014   Tdap 08/18/2008   Zoster, Live 09/01/2013    Past  Medical History:  Diagnosis Date   Anosmia    CAD (coronary artery disease), native coronary artery    a. 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 placed on colchicine    ED (erectile dysfunction)    Elevated TSH    Fatty liver    Gout    Ischemic cardiomyopathy    a. 2D ECHO 11/04/14 w/ EF 40-45% with LV WMA, G2DD, mild MR, mild LA dilation, mod TR.   PAF (paroxysmal atrial fibrillation) ([place])    a. Dx on 11/03/14 admission. Not placed on longterm AC due to DAPT w/ receent DES. Will plan for outpt heart monitor    Pre-diabetes    a. HgA1c 6.1 in 10/2014   Prostate cancer [facility])    s/p prostatectomy and XRT   Rheumatic fever 1947    Tobacco History: Social History   Tobacco Use  Smoking Status Former   Types: Cigarettes   Quit date: 08/18/1992   Years since quitting: 30.1  Smokeless Tobacco Never   Counseling given: Not Answered   Outpatient Medications Prior to Visit  Medication Sig Dispense Refill   acetaminophen (TYLENOL) 500 MG tablet Take 500 mg by mouth every 6 (six) hours as needed for mild pain or moderate pain.     ascorbic acid (VITAMIN C) 500 MG tablet Take 1,000 mg by mouth daily.     Blood Pressure Monitor KIT For daily blood pressure monitoring;  Arm Cuff 1 each 0   carvedilol (COREG) 3.125 MG tablet Take 1 tablet (3.125 mg total) by mouth daily. Take 3.125 mg daily     Cholecalciferol (VITAMIN D3) 25 MCG (1000 UT) CAPS Take 1 capsule by mouth daily.     colchicine 0.6 MG tablet Take 1 tablet twice a day by oral route.     escitalopram (LEXAPRO) 5 MG tablet Take 5 mg by mouth daily.     hydrOXYzine (ATARAX) 10 MG tablet Take 1 tablet (10 mg total) by mouth 3 (three) times daily as needed. 30 tablet 0   lidocaine-prilocaine (EMLA) cream Apply 1 Application topically as needed. 30 g 2   nystatin (MYCOSTATIN/NYSTOP) powder Apply 1 Application topically 3 (three) times daily. 15 g 0   oxyCODONE (ROXICODONE) 5 MG immediate release tablet Take 0.5-1 tablets (2.5-5 mg total) by mouth every 4 (four) hours as needed. 30 tablet 0   PROCTO-MED HC 2.5 % rectal cream Apply topically.      triamcinolone cream (KENALOG) 0.1 % Apply 1 Application topically 2 (two) times daily. 80 g 0   umeclidinium bromide (INCRUSE ELLIPTA) 62.5 MCG/ACT AEPB Inhale 1 puff into the lungs daily. 30 each 5   doxycycline (VIBRA-TABS) 100 MG tablet Take 1 tablet (100 mg total) by mouth 2 (two) times daily. 20 tablet 0   No facility-administered medications prior to visit.     Review of Systems:   Constitutional:   No  weight loss, night sweats,  Fevers, chills,  +fatigue, or  lassitude.  HEENT:   No headaches,  Difficulty swallowing,  Tooth/dental problems, or  Sore throat,                No sneezing, itching, ear ache, nasal congestion, post nasal drip,   CV:  No chest pain,  Orthopnea, PND, swelling in lower extremities, anasarca, dizziness, palpitations, syncope.   GI  No heartburn, indigestion, abdominal  pain, nausea, vomiting, diarrhea, change in bowel habits, loss of appetite, bloody stools.   Resp:   No chest wall deformity  Skin: no rash or lesions.  GU: no dysuria, change in color of urine, no urgency or frequency.  No flank pain, no hematuria   MS:  No joint pain or swelling.  No decreased range of motion.  No back pain.    Physical Exam  BP 112/78   Pulse 67   Ht 5\' 8"  (1.727 m)   Wt 139 lb 12.8 oz (63.4 kg)   SpO2 95%   BMI 21.26 kg/m   GEN: A/Ox3; pleasant , NAD, well nourished    HEENT:  [place]/AT,  NOSE-clear, THROAT-clear, no lesions, no postnasal drip or exudate noted.   NECK:  Supple w/ fair ROM; no JVD; normal carotid impulses w/o bruits; no thyromegaly or nodules palpated; no lymphadenopathy.    RESP bibasilar crackles  no accessory muscle use, no dullness to percussion  CARD:  RRR, no m/r/g, no peripheral edema, pulses intact, no cyanosis or clubbing.  GI:   Soft & nt; nml bowel sounds; no organomegaly or masses detected.   Musco: Warm bil, no deformities or joint swelling noted.   Neuro: alert, no focal deficits noted.    Skin: Warm, no lesions or  rashes    Lab Results:  CBC     ProBNP No results found for: "PROBNP"  Imaging: No results found.  durvalumab (IMFINZI) 1,500 mg in sodium chloride 0.9 % 100 mL chemo infusion     Date Action Dose Route User   08/19/2022 1036 Infusion Verify (none) Intravenous Charleston Poot, RN   08/19/2022 1035 Rate/Dose Change (none) Intravenous Charleston Poot, RN   08/19/2022 1035 New Bag/Given 1,500 mg Intravenous Kem Kays, RN      durvalumab (IMFINZI) 1,500 mg in sodium chloride 0.9 % 100 mL chemo infusion     Date Action Dose Route User   09/15/2022 1242 Infusion Verify (none) Intravenous Kem Kays, RN   09/15/2022 1242 Rate/Dose Change (none) Intravenous Kem Kays, RN   09/15/2022 1242 Rate/Dose Change (none) Intravenous Kem Kays, RN   09/15/2022 1242 Infusion Verify (none) Intravenous Kem Kays, RN   09/15/2022 1241 Rate/Dose Change (none) Intravenous Kem Kays, RN      heparin lock flush 100 unit/mL     Date Action Dose Route User   08/19/2022 1157 Given 500 Units Intracatheter Kem Kays, RN      0.9 %  sodium chloride infusion     Date Action Dose Route User   08/19/2022 1145 Restarted (none) Intravenous Charleston Poot, RN   08/19/2022 1032 Infusion Verify (none) Intravenous Charleston Poot, RN   08/19/2022 1031 Infusion Verify (none) Intravenous Charleston Poot, RN   08/19/2022 [phone] New Bag/Given (none) Intravenous Kem Kays, RN      0.9 %  sodium chloride infusion     Date Action Dose Route User   09/15/2022 1351 Rate/Dose Change (none) Intravenous Kem Kays, RN   09/15/2022 1346 Rate/Dose Change (none) Intravenous Kem Kays, RN   09/15/2022 1135 New Bag/Given (none) Intravenous Kem Kays, RN      sodium chloride flush (NS) 0.9 % injection 10 mL     Date Action Dose Route User   08/19/2022 0848 Given 10 mL Intracatheter Anastasia Pall, RN      sodium chloride flush (NS) 0.9 % injection 10 mL  Date Action Dose Route User   08/19/2022 1156 Given 10 mL Intracatheter Kem Kays, RN           No data to display          No results found for: "NITRICOXIDE"      Assessment & Plan:   No problem-specific Assessment & Plan notes found for this encounter.     Rexene Edison, NP 09/30/2022

## 2022-09-30 NOTE — Telephone Encounter (Signed)
Pedro Pope, Patient cannot afford Esbriet, please advise on status of patient assistance.  Thank you.

## 2022-09-30 NOTE — Progress Notes (Signed)
Full PFT completed today 

## 2022-10-01 ENCOUNTER — Telehealth: Payer: Self-pay | Admitting: *Deleted

## 2022-10-01 NOTE — Telephone Encounter (Signed)
ONO received via fax and given to Rexene Edison NP for review.  Nothing further needed.

## 2022-10-01 NOTE — Telephone Encounter (Signed)
Per [employer] portal, the status of pt's application is "Closed, Patient Declined Service". Reached out to pt for more information and spoke with his son who tells me that the pt's Oncologist has asked him to "pause" (the [place]) until further notice, which is why they declined services through [facility] at that time.   I am unclear why the inability to afford the medication was of concern yesterday, assuming that the pt is in fact under the impression that he is NOT to be taking the Esbriet until given the green light from his Oncologist. Pt's son stated that he will make sure to f/u regarding this at the next appointment, and he has been given my direct office number to reach me again once he has an update.

## 2022-10-02 ENCOUNTER — Ambulatory Visit (HOSPITAL_COMMUNITY)
Admission: RE | Admit: 2022-10-02 | Discharge: 2022-10-02 | Disposition: A | Discharge status: Home / Self Care | Payer: Medicare Other | Source: Ambulatory Visit | Attending: Physician Assistant | Admitting: Physician Assistant

## 2022-10-02 DIAGNOSIS — Z452 Encounter for adjustment and management of vascular access device: Secondary | ICD-10-CM | POA: Diagnosis not present

## 2022-10-02 DIAGNOSIS — T80212A Local infection due to central venous catheter, initial encounter: Secondary | ICD-10-CM | POA: Diagnosis not present

## 2022-10-02 DIAGNOSIS — J849 Interstitial pulmonary disease, unspecified: Secondary | ICD-10-CM | POA: Insufficient documentation

## 2022-10-02 DIAGNOSIS — J432 Centrilobular emphysema: Secondary | ICD-10-CM | POA: Insufficient documentation

## 2022-10-02 DIAGNOSIS — J9611 Chronic respiratory failure with hypoxia: Secondary | ICD-10-CM | POA: Insufficient documentation

## 2022-10-02 HISTORY — PX: IR RADIOLOGIST EVAL & MGMT: IMG5224

## 2022-10-02 NOTE — Assessment & Plan Note (Signed)
Extensive stage small cell lung cancer.  Continue with treatment plan and follow-up with oncology

## 2022-10-02 NOTE — Assessment & Plan Note (Signed)
No perceived benefit on increased or Spiriva.  Not affordable via insurance.  For now we will continue on albuterol as needed.  If increased symptom burden could consider changing to Yupelri through the nebulizer.

## 2022-10-02 NOTE — Assessment & Plan Note (Signed)
Patient is on home oxygen 2 L with activity and at bedtime.  Walk test today in the office shows no significant desaturations with ambulation.  Overnight oximetry test is pending.  Pending these results we will decide if oxygen is to be continued

## 2022-10-02 NOTE — Assessment & Plan Note (Signed)
Interstitial lung disease with probable IPF.  CT with UIP changes.-Patient will begin Esbriet once approved by insurance.  He is awaiting pharmacy approval.  We have reached out to our pharmacy team to check on status.  Plan  Patient Instructions  Will check with Pharmacy team regarding Esbriet status  Activity as tolerated.  Will call once Overnight oximetry test results are back.  Albuterol inhaler 1-2 puffs every 6hrs as needed for shortness of breath.  Follow up with Dr. Chase Caller in 6-8 weeks and As needed

## 2022-10-02 NOTE — Progress Notes (Signed)
Patient presented to IR today for port evaluation. Please see full dictation under the imaging tab in Epic.  Soyla Dryer, [place] [phone] 10/02/2022, 4:10 PM

## 2022-10-03 ENCOUNTER — Telehealth: Payer: Self-pay | Admitting: Adult Health

## 2022-10-03 DIAGNOSIS — J9611 Chronic respiratory failure with hypoxia: Secondary | ICD-10-CM

## 2022-10-03 DIAGNOSIS — J849 Interstitial pulmonary disease, unspecified: Secondary | ICD-10-CM

## 2022-10-03 NOTE — Telephone Encounter (Signed)
Called and spoke with pt's son Pedro Pope letting him know the results of ONO and stated to him that pt needs to make sure he wears 2L O2 at night. Pedro Pope verbalized understanding. Nothing further needed.

## 2022-10-03 NOTE — Telephone Encounter (Signed)
Overnight oximetry test showed O2 saturations less than 88% 28 minutes.  Duration of test was around 9 hours.  Recommend continue oxygen 2 L at bedtime.

## 2022-10-05 ENCOUNTER — Other Ambulatory Visit: Payer: Self-pay

## 2022-10-06 DIAGNOSIS — C384 Malignant neoplasm of pleura: Secondary | ICD-10-CM | POA: Diagnosis not present

## 2022-10-10 ENCOUNTER — Encounter: Payer: Self-pay | Admitting: Internal Medicine

## 2022-10-10 ENCOUNTER — Other Ambulatory Visit: Payer: Self-pay

## 2022-10-10 DIAGNOSIS — C782 Secondary malignant neoplasm of pleura: Secondary | ICD-10-CM | POA: Diagnosis not present

## 2022-10-10 DIAGNOSIS — J910 Malignant pleural effusion: Secondary | ICD-10-CM | POA: Diagnosis not present

## 2022-10-10 DIAGNOSIS — L538 Other specified erythematous conditions: Secondary | ICD-10-CM | POA: Diagnosis not present

## 2022-10-10 DIAGNOSIS — C3490 Malignant neoplasm of unspecified part of unspecified bronchus or lung: Secondary | ICD-10-CM | POA: Diagnosis not present

## 2022-10-10 DIAGNOSIS — C3412 Malignant neoplasm of upper lobe, left bronchus or lung: Secondary | ICD-10-CM | POA: Diagnosis not present

## 2022-10-10 DIAGNOSIS — Z87891 Personal history of nicotine dependence: Secondary | ICD-10-CM | POA: Diagnosis not present

## 2022-10-10 LAB — PULMONARY FUNCTION TEST
DL/VA % pred: 49 %
DL/VA: 1.93 ml/min/mmHg/L
DLCO cor % pred: 42 %
DLCO cor: 9.73 ml/min/mmHg
DLCO unc % pred: 40 %
DLCO unc: 9.13 ml/min/mmHg
FEF 25-75 Post: 2.22 L/sec
FEF 25-75 Pre: 2.31 L/sec
FEF2575-%Change-Post: -3 %
FEF2575-%Pred-Post: 127 %
FEF2575-%Pred-Pre: 132 %
FEV1-%Change-Post: -2 %
FEV1-%Pred-Post: 105 %
FEV1-%Pred-Pre: 108 %
FEV1-Post: 2.72 L
FEV1-Pre: 2.79 L
FEV1FVC-%Change-Post: -1 %
FEV1FVC-%Pred-Pre: 103 %
FEV6-%Change-Post: 0 %
FEV6-%Pred-Post: 110 %
FEV6-%Pred-Pre: 111 %
FEV6-Post: 3.76 L
FEV6-Pre: 3.78 L
FEV6FVC-%Pred-Post: 107 %
FEV6FVC-%Pred-Pre: 107 %
FVC-%Change-Post: 0 %
FVC-%Pred-Post: 102 %
FVC-%Pred-Pre: 103 %
FVC-Post: 3.76 L
Post FEV1/FVC ratio: 72 %
Post FEV6/FVC ratio: 100 %
Pre FEV1/FVC ratio: 74 %
Pre FEV6/FVC Ratio: 100 %
RV % pred: 107 %
RV: 2.76 L
TLC % pred: 96 %
TLC: 6.45 L

## 2022-10-13 ENCOUNTER — Other Ambulatory Visit: Payer: Self-pay | Admitting: Medical Oncology

## 2022-10-13 ENCOUNTER — Inpatient Hospital Stay: Payer: Medicare Other

## 2022-10-13 ENCOUNTER — Other Ambulatory Visit (HOSPITAL_COMMUNITY): Payer: Self-pay | Admitting: Internal Medicine

## 2022-10-13 ENCOUNTER — Encounter: Payer: Self-pay | Admitting: Internal Medicine

## 2022-10-13 ENCOUNTER — Ambulatory Visit (HOSPITAL_COMMUNITY)
Admission: RE | Admit: 2022-10-13 | Discharge: 2022-10-13 | Disposition: A | Discharge status: Home / Self Care | Payer: Medicare Other | Source: Ambulatory Visit | Attending: Internal Medicine | Admitting: Internal Medicine

## 2022-10-13 ENCOUNTER — Other Ambulatory Visit: Payer: Self-pay | Admitting: Internal Medicine

## 2022-10-13 ENCOUNTER — Inpatient Hospital Stay (HOSPITAL_BASED_OUTPATIENT_CLINIC_OR_DEPARTMENT_OTHER): Payer: Medicare Other | Admitting: Internal Medicine

## 2022-10-13 ENCOUNTER — Other Ambulatory Visit: Payer: Medicare Other

## 2022-10-13 ENCOUNTER — Other Ambulatory Visit (HOSPITAL_COMMUNITY): Payer: Self-pay | Admitting: Interventional Radiology

## 2022-10-13 ENCOUNTER — Inpatient Hospital Stay: Payer: Medicare Other | Attending: Internal Medicine

## 2022-10-13 VITALS — BP 105/50 | HR 62 | Temp 98.2°F | Resp 16

## 2022-10-13 DIAGNOSIS — C3482 Malignant neoplasm of overlapping sites of left bronchus and lung: Secondary | ICD-10-CM

## 2022-10-13 DIAGNOSIS — Z5112 Encounter for antineoplastic immunotherapy: Secondary | ICD-10-CM | POA: Insufficient documentation

## 2022-10-13 DIAGNOSIS — Z95828 Presence of other vascular implants and grafts: Secondary | ICD-10-CM

## 2022-10-13 DIAGNOSIS — Z452 Encounter for adjustment and management of vascular access device: Secondary | ICD-10-CM | POA: Insufficient documentation

## 2022-10-13 DIAGNOSIS — C3490 Malignant neoplasm of unspecified part of unspecified bronchus or lung: Secondary | ICD-10-CM

## 2022-10-13 DIAGNOSIS — C782 Secondary malignant neoplasm of pleura: Secondary | ICD-10-CM | POA: Insufficient documentation

## 2022-10-13 DIAGNOSIS — C787 Secondary malignant neoplasm of liver and intrahepatic bile duct: Secondary | ICD-10-CM | POA: Insufficient documentation

## 2022-10-13 DIAGNOSIS — T82518A Breakdown (mechanical) of other cardiac and vascular devices and implants, initial encounter: Secondary | ICD-10-CM | POA: Diagnosis not present

## 2022-10-13 HISTORY — PX: IR RADIOLOGIST EVAL & MGMT: IMG5224

## 2022-10-13 HISTORY — PX: IR REMOVAL TUN ACCESS W/ PORT W/O FL MOD SED: IMG2290

## 2022-10-13 LAB — CMP (CANCER CENTER ONLY)
ALT: 9 U/L (ref 0–44)
AST: 18 U/L (ref 15–41)
Albumin: 3.8 g/dL (ref 3.5–5.0)
Alkaline Phosphatase: 69 U/L (ref 38–126)
Anion gap: 3 — ABNORMAL LOW (ref 5–15)
BUN: 21 mg/dL (ref 8–23)
CO2: 31 mmol/L (ref 22–32)
Calcium: 8.8 mg/dL — ABNORMAL LOW (ref 8.9–10.3)
Chloride: 108 mmol/L (ref 98–111)
Creatinine: 0.86 mg/dL (ref 0.61–1.24)
GFR, Estimated: >60 mL/min (ref >60)
Glucose, Bld: 69 mg/dL — ABNORMAL LOW (ref 70–99)
Potassium: 5 mmol/L (ref 3.5–5.1)
Sodium: 142 mmol/L (ref 135–145)
Total Bilirubin: 0.4 mg/dL (ref 0.3–1.2)
Total Protein: 6.4 g/dL — ABNORMAL LOW (ref 6.5–8.1)

## 2022-10-13 LAB — CBC WITH DIFFERENTIAL (CANCER CENTER ONLY)
Abs Immature Granulocytes: 0.02 10*3/uL (ref 0.00–0.07)
Basophils Absolute: 0 10*3/uL (ref 0.0–0.1)
Basophils Relative: 0 %
Eosinophils Absolute: 0.3 10*3/uL (ref 0.0–0.5)
Eosinophils Relative: 3 %
HCT: 40 % (ref 39.0–52.0)
Hemoglobin: 12.9 g/dL — ABNORMAL LOW (ref 13.0–17.0)
Immature Granulocytes: 0 %
Lymphocytes Relative: 27 %
Lymphs Abs: 2.2 10*3/uL (ref 0.7–4.0)
MCH: 29.5 pg (ref 26.0–34.0)
MCHC: 32.3 g/dL (ref 30.0–36.0)
MCV: 91.3 fL (ref 80.0–100.0)
Monocytes Absolute: 0.9 10*3/uL (ref 0.1–1.0)
Monocytes Relative: 11 %
Neutro Abs: 4.9 10*3/uL (ref 1.7–7.7)
Neutrophils Relative %: 59 %
Platelet Count: 185 10*3/uL (ref 150–400)
RBC: 4.38 MIL/uL (ref 4.22–5.81)
RDW: 15 % (ref 11.5–15.5)
WBC Count: 8.3 10*3/uL (ref 4.0–10.5)
nRBC: 0 % (ref 0.0–0.2)

## 2022-10-13 MED ORDER — DOXYCYCLINE HYCLATE 100 MG PO TABS: 100.0000 mg | ORAL_TABLET | Freq: Two times a day (BID) | ORAL | 0 refills | Status: DC

## 2022-10-13 MED ORDER — SODIUM CHLORIDE 0.9 % IV SOLN
1500.0000 mg | Freq: Once | INTRAVENOUS | Status: AC
  Administered 2022-10-13: 1500 mg via INTRAVENOUS
  Filled 2022-10-13: qty 30

## 2022-10-13 MED ORDER — LIDOCAINE-EPINEPHRINE 1 %-1:100000 IJ SOLN
INTRAMUSCULAR | Status: AC
  Administered 2022-10-13: 19 mL via INTRADERMAL
  Filled 2022-10-13: qty 1

## 2022-10-13 MED ORDER — SODIUM CHLORIDE 0.9 % IV SOLN: Freq: Once | INTRAVENOUS | Status: AC

## 2022-10-13 NOTE — Progress Notes (Signed)
Patients port is red, inflamed, with yellow discharge. Dr. Julien Nordmann aware. Lab appointment mad for peripheral lab draw.

## 2022-10-13 NOTE — Progress Notes (Signed)
[facility] Telephone:([phone]) [phone]   Fax:([phone]) [phone]  OFFICE PROGRESS NOTE  Pedro Baton, MD [place] [address]  DIAGNOSIS: Extensive stage (T3, N2, M1b) small cell lung cancer presented with obstructive left upper lobe lung mass in addition to mediastinal lymphadenopathy and malignant left pleural effusion as well as pleural-based metastasis as well as suspicious liver metastasis diagnosed in June 2023.  PRIOR THERAPY: None  CURRENT THERAPY: Palliative systemic chemotherapy with carboplatin initiated for AUC of 4 on day 1 and etoposide 80 Mg/M2 on days 1, 2 and 3 with Cosela before the chemotherapy and Imfinzi 1500 Mg IV on day 1 every 3 weeks.  Status post 10 cycles.  Starting from cycle #5 the patient will be on maintenance treatment with Imfinzi 1500 Mg IV every 4 weeks.   INTERVAL HISTORY: Pedro Pope. 82 y.o. male returns to the clinic today for follow-up visit accompanied by his wife.  The patient is feeling fine today with no concerning complaints except for the erythema around the Port-A-Cath site.  He was seen recently by Dr. Vashti Hey at [facility] for consideration of enrollment on small cell clinical trial but the patient was not a candidate for the trial since he does not have disease progression.  There was some concern about the Port-A-Cath site with possible infection.  He denied having any current chest pain, shortness of breath, cough or hemoptysis.  He has no nausea, vomiting, diarrhea or constipation.  He has no headache or visual changes.  He denied having any significant weight loss or night sweats.  MEDICAL HISTORY: Past Medical History:  Diagnosis Date   Anosmia    CAD (coronary artery disease), native coronary artery    a. 10/29/2013 antlat STEMI s/p DES to pLAD; residual 80 % proximal and 60% mid LCx diease   Chronic headaches    Dressler's syndrome ([facility])    a. placed on colchicine    ED (erectile  dysfunction)    Elevated TSH    Fatty liver    Gout    Ischemic cardiomyopathy    a. 2D ECHO 11/04/14 w/ EF 40-45% with LV WMA, G2DD, mild MR, mild LA dilation, mod TR.   PAF (paroxysmal atrial fibrillation) ([place])    a. Dx on 11/03/14 admission. Not placed on longterm AC due to DAPT w/ receent DES. Will plan for outpt heart monitor    Pre-diabetes    a. HgA1c 6.1 in 10/2014   Prostate cancer [facility])    s/p prostatectomy and XRT   Rheumatic fever 1947    ALLERGIES:  is allergic to penicillins, clindamycin, corn syrup [glucose], and aspirin adult low [aspirin].  MEDICATIONS:  Current Outpatient Medications  Medication Sig Dispense Refill   acetaminophen (TYLENOL) 500 MG tablet Take 500 mg by mouth every 6 (six) hours as needed for mild pain or moderate pain.     albuterol (VENTOLIN HFA) 108 (90 Base) MCG/ACT inhaler Inhale 1-2 puffs into the lungs every 6 (six) hours as needed. 8 g 2   ascorbic acid (VITAMIN C) 500 MG tablet Take 1,000 mg by mouth daily.     Blood Pressure Monitor KIT For daily blood pressure monitoring;  Arm Cuff 1 each 0   carvedilol (COREG) 3.125 MG tablet Take 1 tablet (3.125 mg total) by mouth daily. Take 3.125 mg daily     Cholecalciferol (VITAMIN D3) 25 MCG (1000 UT) CAPS Take 1 capsule by mouth daily.  colchicine 0.6 MG tablet Take 1 tablet twice a day by oral route.     escitalopram (LEXAPRO) 5 MG tablet Take 5 mg by mouth daily.     hydrOXYzine (ATARAX) 10 MG tablet Take 1 tablet (10 mg total) by mouth 3 (three) times daily as needed. 30 tablet 0   lidocaine-prilocaine (EMLA) cream Apply 1 Application topically as needed. 30 g 2   nystatin (MYCOSTATIN/NYSTOP) powder Apply 1 Application topically 3 (three) times daily. 15 g 0   oxyCODONE (ROXICODONE) 5 MG immediate release tablet Take 0.5-1 tablets (2.5-5 mg total) by mouth every 4 (four) hours as needed. 30 tablet 0   PROCTO-MED HC 2.5 % rectal cream Apply topically.     triamcinolone cream (KENALOG) 0.1 %  Apply 1 Application topically 2 (two) times daily. 80 g 0   umeclidinium bromide (INCRUSE ELLIPTA) 62.5 MCG/ACT AEPB Inhale 1 puff into the lungs daily. 30 each 5   No current facility-administered medications for this visit.    SURGICAL HISTORY:  Past Surgical History:  Procedure Laterality Date   CORONARY ANGIOPLASTY WITH STENT PLACEMENT  10/30/14   STEMI s/p DES to proximal LAD; residual 80 % proximal and 60% mid LCx diease   IR IMAGING GUIDED PORT INSERTION  03/12/2022   IR RADIOLOGIST EVAL & MGMT  10/02/2022   LEFT HEART CATHETERIZATION WITH CORONARY ANGIOGRAM N/A 10/30/2014   Procedure: LEFT HEART CATHETERIZATION WITH CORONARY ANGIOGRAM;  Surgeon: Lorretta Harp, MD;  Location: [facility] CATH LAB;  Service: Cardiovascular;  Laterality: N/A;   PERCUTANEOUS CORONARY STENT INTERVENTION (PCI-S)  10/30/2014   Procedure: PERCUTANEOUS CORONARY STENT INTERVENTION (PCI-S);  Surgeon: Lorretta Harp, MD;  Location: [facility] CATH LAB;  Service: Cardiovascular;;   PROSTATECTOMY     THORACENTESIS Left 12/31/2021   Procedure: THORACENTESIS;  Surgeon: Candee Furbish, MD;  Location: [facility] ENDOSCOPY;  Service: Pulmonary;  Laterality: Left;   TONSILLECTOMY     VIDEO ASSISTED THORACOSCOPY (VATS)/DECORTICATION Left 01/21/2022   Procedure: VIDEO ASSISTED THORACOSCOPY (VATS)/DECORTICATION;  Surgeon: Lajuana Matte, MD;  Location: [facility];  Service: Thoracic;  Laterality: Left;    REVIEW OF SYSTEMS:  Constitutional: positive for fatigue Eyes: negative Ears, nose, mouth, throat, and face: negative Respiratory: negative Cardiovascular: negative Gastrointestinal: negative Genitourinary:negative Integument/breast: negative Hematologic/lymphatic: negative Musculoskeletal:negative Neurological: negative Behavioral/Psych: negative Endocrine: negative Allergic/Immunologic: negative   PHYSICAL EXAMINATION: General appearance: alert, cooperative, fatigued, and no distress Head: Normocephalic, without obvious  abnormality, atraumatic Neck: no adenopathy, no JVD, supple, symmetrical, trachea midline, and thyroid not enlarged, symmetric, no tenderness/mass/nodules Lymph nodes: Cervical, supraclavicular, and axillary nodes normal. Resp: clear to auscultation bilaterally Back: symmetric, no curvature. ROM normal. No CVA tenderness. Cardio: regular rate and rhythm, S1, S2 normal, no murmur, click, rub or gallop GI: soft, non-tender; bowel sounds normal; no masses,  no organomegaly Extremities: extremities normal, atraumatic, no cyanosis or edema Neurologic: Alert and oriented X 3, normal strength and tone. Normal symmetric reflexes. Normal coordination and gait  ECOG PERFORMANCE STATUS: 1 - Symptomatic but completely ambulatory  Blood pressure (!) 131/46, pulse (!) 52, temperature 98 F (36.7 C), temperature source Oral, resp. rate 18, weight 140 lb 2 oz (63.6 kg), SpO2 97 %.  LABORATORY DATA: Lab Results  Component Value Date   WBC 8.3 10/13/2022   HGB 12.9 (L) 10/13/2022   HCT 40.0 10/13/2022   MCV 91.3 10/13/2022   PLT 185 10/13/2022      Chemistry      Component Value Date/Time   NA 142 10/13/2022 1029  K 5.0 10/13/2022 1029   CL 108 10/13/2022 1029   CO2 31 10/13/2022 1029   BUN 21 10/13/2022 1029   CREATININE 0.86 10/13/2022 1029      Component Value Date/Time   CALCIUM 8.8 (L) 10/13/2022 1029   ALKPHOS 69 10/13/2022 1029   AST 18 10/13/2022 1029   ALT 9 10/13/2022 1029   BILITOT 0.4 10/13/2022 1029       RADIOGRAPHIC STUDIES: IR Radiologist Eval & Mgmt  Result Date: 10/02/2022 EXAM: Patient with a history of small cell lung carcinoma with port-a-catheter placed in IR 03/12/2022. Patient now with tenderness and erythema at port site. Interventional Radiology asked to evaluate patient. CHIEF COMPLAINT: Pain and redness at port site. HISTORY OF PRESENT ILLNESS: Patient with port placed in IR 03/12/2022. Port site became tender and erythematous in early January and his  oncology team gave him a round of antibiotics. The antibiotics did not change the tenderness and erythema at port site. Interventional radiology asked to evaluate. REVIEW OF SYSTEMS: Patient endorses mild-to-moderate tenderness with palpation port site. He denies any systemic symptoms such as fevers, chills, nausea, diarrhea etc. PHYSICAL EXAMINATION: Skin over the port is intact but thin. The skin is erythematous and blanches with palpation. Small blood vessels are quite noticeable over the port site but the site does not appear infected. Site appears more irritated as if from friction. ASSESSMENT AND PLAN: IR recommends keeping the site covered with a soft bandage to provide cushioning and protection from clothes and other irritants. Patient advised to contact his oncology team if he develops any systemic symptoms such as fevers, chills, nausea, diarrhea etc. Port OK for continued use. Read by: Soyla Dryer, NP Electronically Signed   By: Ruthann Cancer M.D.   On: 10/02/2022 16:55    ASSESSMENT AND PLAN: This is a very pleasant 82 years old white male with Extensive stage (T3, N2, M1 a) small cell lung cancer presented with obstructive left upper lobe lung mass in addition to mediastinal lymphadenopathy and malignant left pleural effusion as well as pleural-based metastasis and suspicious liver metastasis diagnosed in June 2023. The patient is currently undergoing systemic chemotherapy with carboplatin for AUC of 4 on day 1, etoposide 80 Mg/M2 on days 1, 2 and 3 with Imfinzi 240 Mg/M2 on the days before chemotherapy and Imfinzi 1500 Mg IV on day 1 every 3 weeks.  Status post 10 cycles.  Starting from cycle #5 he will be on maintenance treatment with immunotherapy with Imfinzi 1500 Mg IV every 4 weeks. The patient has been tolerating this treatment well with no concerning adverse effects. He was seen at [facility] for consideration of clinical trial but the patient is not eligible  for the trial because he has no disease progression. I recommended for him to proceed with cycle #11 today as planned. For the suspicious Port-A-Cath inflammation, we will schedule him to see interventional etiology again for reevaluation of his Port-A-Cath.  I will start the patient empirically on doxycycline 100 mg p.o. twice daily for the next 7 days. We will proceed with his treatment through the peripheral IV for now. The patient will come back for follow-up visit in 4 weeks for evaluation before the next cycle of his treatment. He was advised to call immediately if he has any concerning symptoms in the interval. The patient voices understanding of current disease status and treatment options and is in agreement with the current care plan.  All questions were answered. The patient  knows to call the clinic with any problems, questions or concerns. We can certainly see the patient much sooner if necessary.  The total time spent in the appointment was 30 minutes.  Disclaimer: This note was dictated with voice recognition software. Similar sounding words can inadvertently be transcribed and may not be corrected upon review.

## 2022-10-13 NOTE — Progress Notes (Signed)
Vascular and Interventional Radiology  PRE PROCEDURE H&P  Assessment  Plan:   [name]. is a 82 y.o. year old male who will undergo PORT CATHETER REMOVAL in Interventional Radiology.  The procedure has been fully reviewed with the patient/patient's authorized representative. The risks, benefits and alternatives have been explained, and the patient/patient's authorized representative has consented to the procedure.  HPI: [name]. is a 82 y.o. year old male w PMHx signinifcant for LEFT SCLC. Pt on palliative systemic chemoRx via RIJV port catheter placed on 03/12/22. Oncologist noticed skin erythema at infusion appointment and Pt was re-referred for evaluation on 10/02/22. He was seen back in Clinic and it appeared as though the port had eroded through the skin. He presents for port removal.  Informed consent was obtained, witnessed and placed in the patient's chart.  Allergies:  Allergies  Allergen Reactions   Penicillins Anaphylaxis   Clindamycin Other (See Comments)   Corn Syrup [Glucose] Other (See Comments)    High fructose corn syrup causes gout   Aspirin Adult Low [Aspirin] Itching and Rash    Dr. [name] suspects that long term caused a rash, short term use appears to be ok    Medications:  Current Outpatient Medications on File Prior to Encounter  Medication Sig Dispense Refill   acetaminophen (TYLENOL) 500 MG tablet Take 500 mg by mouth every 6 (six) hours as needed for mild pain or moderate pain.     albuterol (VENTOLIN HFA) 108 (90 Base) MCG/ACT inhaler Inhale 1-2 puffs into the lungs every 6 (six) hours as needed. 8 g 2   ascorbic acid (VITAMIN C) 500 MG tablet Take 1,000 mg by mouth daily.     Blood Pressure Monitor KIT For daily blood pressure monitoring;  Arm Cuff 1 each 0   carvedilol (COREG) 3.125 MG tablet Take 1 tablet (3.125 mg total) by mouth daily. Take 3.125 mg daily     Cholecalciferol (VITAMIN D3) 25 MCG (1000 UT) CAPS Take 1 capsule by  mouth daily.     colchicine 0.6 MG tablet Take 1 tablet twice a day by oral route.     doxycycline (VIBRA-TABS) 100 MG tablet Take 1 tablet (100 mg total) by mouth 2 (two) times daily. 14 tablet 0   escitalopram (LEXAPRO) 5 MG tablet Take 5 mg by mouth daily.     hydrOXYzine (ATARAX) 10 MG tablet Take 1 tablet (10 mg total) by mouth 3 (three) times daily as needed. 30 tablet 0   lidocaine-prilocaine (EMLA) cream Apply 1 Application topically as needed. 30 g 2   nystatin (MYCOSTATIN/NYSTOP) powder Apply 1 Application topically 3 (three) times daily. 15 g 0   oxyCODONE (ROXICODONE) 5 MG immediate release tablet Take 0.5-1 tablets (2.5-5 mg total) by mouth every 4 (four) hours as needed. 30 tablet 0   PROCTO-MED HC 2.5 % rectal cream Apply topically.     triamcinolone cream (KENALOG) 0.1 % Apply 1 Application topically 2 (two) times daily. 80 g 0   umeclidinium bromide (INCRUSE ELLIPTA) 62.5 MCG/ACT AEPB Inhale 1 puff into the lungs daily. 30 each 5   No current facility-administered medications on file prior to encounter.    PSH:  Past Surgical History:  Procedure Laterality Date   CORONARY ANGIOPLASTY WITH STENT PLACEMENT  10/30/14   STEMI s/p DES to proximal LAD; residual 80 % proximal and 60% mid LCx diease   IR IMAGING GUIDED PORT INSERTION  03/12/2022   IR RADIOLOGIST EVAL & MGMT  10/02/2022   IR RADIOLOGIST EVAL & MGMT  10/13/2022   LEFT HEART CATHETERIZATION WITH CORONARY ANGIOGRAM N/A 10/30/2014   Procedure: LEFT HEART CATHETERIZATION WITH CORONARY ANGIOGRAM;  Surgeon: Lorretta Harp, MD;  Location: [facility] CATH LAB;  Service: Cardiovascular;  Laterality: N/A;   PERCUTANEOUS CORONARY STENT INTERVENTION (PCI-S)  10/30/2014   Procedure: PERCUTANEOUS CORONARY STENT INTERVENTION (PCI-S);  Surgeon: Lorretta Harp, MD;  Location: [facility] CATH LAB;  Service: Cardiovascular;;   PROSTATECTOMY     THORACENTESIS Left 12/31/2021   Procedure: THORACENTESIS;  Surgeon: Candee Furbish, MD;  Location: [facility]  ENDOSCOPY;  Service: Pulmonary;  Laterality: Left;   TONSILLECTOMY     VIDEO ASSISTED THORACOSCOPY (VATS)/DECORTICATION Left 01/21/2022   Procedure: VIDEO ASSISTED THORACOSCOPY (VATS)/DECORTICATION;  Surgeon: Lajuana Matte, MD;  Location: [facility] OR;  Service: Thoracic;  Laterality: Left;    PMH:  Past Medical History:  Diagnosis Date   Anosmia    CAD (coronary artery disease), native coronary artery    a. 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 placed on colchicine    ED (erectile dysfunction)    Elevated TSH    Fatty liver    Gout    Ischemic cardiomyopathy    a. 2D ECHO 11/04/14 w/ EF 40-45% with LV WMA, G2DD, mild MR, mild LA dilation, mod TR.   PAF (paroxysmal atrial fibrillation) ([place])    a. Dx on 11/03/14 admission. Not placed on longterm AC due to DAPT w/ receent DES. Will plan for outpt heart monitor    Pre-diabetes    a. HgA1c 6.1 in 10/2014   Prostate cancer [facility])    s/p prostatectomy and XRT   Rheumatic fever 1947    Brief Physical Examination: There were no vitals filed for this visit. General: WD, WN male in NAD HEENT: Normocephalic, atraumatic Lungs: Respirations non-labored Skin: R chest port. Erythematous skin with erosion.  ASA Grade: 3 Mallampati Class: 2   Michaelle Birks, MD Vascular and Interventional Radiology Specialists [facility] Radiology   Pager. [phone] Clinic. [phone]   I spent a total of 25 Minutes of face-to-face time in clinical consultation, greater than 50% of which was counseling/coordinating care for Mr Pedro Pope. evaluation for port site concern.

## 2022-10-13 NOTE — Procedures (Signed)
Vascular and Interventional Radiology Procedure Note  Patient: Pedro Pope. DOB: 1941/01/22 Medical Record Number: [id number] Note Date/Time: 10/13/22 3:57 PM   Performing Physician: Michaelle Birks, MD Assistant(s): None  Diagnosis: Lung cancer and Port site erosion  Procedure: PORT REMOVAL  Anesthesia: Local Anesthetic Complications: None Estimated Blood Loss: Minimal Specimens:  None  Findings:  Successful removal of a right-sided venous port. The incision was left open secondary to infection, and filled with Hydrogel.  See detailed procedure note with images in PACS. The patient tolerated the procedure well without incident or complication and was returned to Recovery in stable condition.    Michaelle Birks, MD Vascular and Interventional Radiology Specialists [facility] Radiology   Pager. [place]

## 2022-10-13 NOTE — Progress Notes (Signed)
Pt. with small redness possible sty noted to right lower eye lid, no drainage, no itching noted. MD notified and Doxycycline prescription sent today for infected port a cath and MD states if it also does not clear up the right eye issue pt. to follow up with his Eye Dr. [name] pt. and he and spouse both understand.

## 2022-10-13 NOTE — Patient Instructions (Signed)
Pedro Pope  Discharge Instructions: Thank you for choosing [facility] to provide your oncology and hematology care.   If you have a lab appointment with the [facility], please go directly to the [facility] and check in at the registration area.   Wear comfortable clothing and clothing appropriate for easy access to any Portacath or PICC line.   We strive to give you quality time with your provider. You may need to reschedule your appointment if you arrive late (15 or more minutes).  Arriving late affects you and other patients whose appointments are after yours.  Also, if you miss three or more appointments without notifying the office, you may be dismissed from the clinic at the provider's discretion.      For prescription refill requests, have your pharmacy contact our office and allow 72 hours for refills to be completed.    Today you received the following chemotherapy and/or immunotherapy agent: Durvalumab (Imfinzi)   To help prevent nausea and vomiting after your treatment, we encourage you to take your nausea medication as directed.  BELOW ARE SYMPTOMS THAT SHOULD BE REPORTED IMMEDIATELY: *FEVER GREATER THAN 100.4 F (38 C) OR HIGHER *CHILLS OR SWEATING *NAUSEA AND VOMITING THAT IS NOT CONTROLLED WITH YOUR NAUSEA MEDICATION *UNUSUAL SHORTNESS OF BREATH *UNUSUAL BRUISING OR BLEEDING *URINARY PROBLEMS (pain or burning when urinating, or frequent urination) *BOWEL PROBLEMS (unusual diarrhea, constipation, pain near the anus) TENDERNESS IN MOUTH AND THROAT WITH OR WITHOUT PRESENCE OF ULCERS (sore throat, sores in mouth, or a toothache) UNUSUAL RASH, SWELLING OR PAIN  UNUSUAL VAGINAL DISCHARGE OR ITCHING   Items with * indicate a potential emergency and should be followed up as soon as possible or go to the Emergency Department if any problems should occur.  Please show the CHEMOTHERAPY ALERT CARD or IMMUNOTHERAPY ALERT CARD  at check-in to the Emergency Department and triage nurse.  Should you have questions after your visit or need to cancel or reschedule your appointment, please contact [facility]  Dept: [phone]  and follow the prompts.  Office hours are 8:00 a.m. to 4:30 p.m. Monday - Friday. Please note that voicemails left after 4:00 p.m. may not be returned until the following business day.  We are closed weekends and major holidays. You have access to a nurse at all times for urgent questions. Please call the main number to the clinic Dept: [phone] and follow the prompts.   For any non-urgent questions, you may also contact your provider using MyChart. We now offer e-Visits for anyone 73 and older to request care online for non-urgent symptoms. For details visit [email or web address].   Also download the MyChart app! Go to the app store, search "MyChart", open the app, select [facility], and log in with your MyChart username and password.  Durvalumab Injection What is this medication? DURVALUMAB (dur VAL ue mab) treats some types of cancer. It works by helping your immune system slow or stop the spread of cancer cells. It is a monoclonal antibody. This medicine may be used for other purposes; ask your health care provider or pharmacist if you have questions. COMMON BRAND NAME(S): IMFINZI What should I tell my care team before I take this medication? They need to know if you have any of these conditions: Allogeneic stem cell transplant (uses someone else's stem cells) Autoimmune diseases, such as Crohn disease, ulcerative colitis, lupus History of chest radiation Nervous system problems, such  as Guillain-Barre syndrome, myasthenia gravis Organ transplant An unusual or allergic reaction to durvalumab, other medications, foods, dyes, or preservatives Pregnant or trying to get pregnant Breast-feeding How should I use this medication? This medication is  infused into a vein. It is given by your care team in a hospital or clinic setting. A special MedGuide will be given to you before each treatment. Be sure to read this information carefully each time. Talk to your care team about the use of this medication in children. Special care may be needed. Overdosage: If you think you have taken too much of this medicine contact a poison control center or emergency room at once. NOTE: This medicine is only for you. Do not share this medicine with others. What if I miss a dose? Keep appointments for follow-up doses. It is important not to miss your dose. Call your care team if you are unable to keep an appointment. What may interact with this medication? Interactions have not been studied. This list may not describe all possible interactions. Give your health care provider a list of all the medicines, herbs, non-prescription drugs, or dietary supplements you use. Also tell them if you smoke, drink alcohol, or use illegal drugs. Some items may interact with your medicine. What should I watch for while using this medication? Your condition will be monitored carefully while you are receiving this medication. You may need blood work while taking this medication. This medication may cause serious skin reactions. They can happen weeks to months after starting the medication. Contact your care team right away if you notice fevers or flu-like symptoms with a rash. The rash may be red or purple and then turn into blisters or peeling of the skin. You may also notice a red rash with swelling of the face, lips, or lymph nodes in your neck or under your arms. Tell your care team right away if you have any change in your eyesight. Talk to your care team if you may be pregnant. Serious birth defects can occur if you take this medication during pregnancy and for 3 months after the last dose. You will need a negative pregnancy test before starting this medication. Contraception  is recommended while taking this medication and for 3 months after the last dose. Your care team can help you find the option that works for you. Do not breastfeed while taking this medication and for 3 months after the last dose. What side effects may I notice from receiving this medication? Side effects that you should report to your care team as soon as possible: Allergic reactions--skin rash, itching, hives, swelling of the face, lips, tongue, or throat Dry cough, shortness of breath or trouble breathing Eye pain, redness, irritation, or discharge with blurry or decreased vision Heart muscle inflammation--unusual weakness or fatigue, shortness of breath, chest pain, fast or irregular heartbeat, dizziness, swelling of the ankles, feet, or hands Hormone gland problems--headache, sensitivity to light, unusual weakness or fatigue, dizziness, fast or irregular heartbeat, increased sensitivity to cold or heat, excessive sweating, constipation, hair loss, increased thirst or amount of urine, tremors or shaking, irritability Infusion reactions--chest pain, shortness of breath or trouble breathing, feeling faint or lightheaded Kidney injury (glomerulonephritis)--decrease in the amount of urine, red or dark brown urine, foamy or bubbly urine, swelling of the ankles, hands, or feet Liver injury--right upper belly pain, loss of appetite, nausea, light-colored stool, dark yellow or brown urine, yellowing skin or eyes, unusual weakness or fatigue Pain, tingling, or numbness in the  hands or feet, muscle weakness, change in vision, confusion or trouble speaking, loss of balance or coordination, trouble walking, seizures Rash, fever, and swollen lymph nodes Redness, blistering, peeling, or loosening of the skin, including inside the mouth Sudden or severe stomach pain, bloody diarrhea, fever, nausea, vomiting Side effects that usually do not require medical attention (report these to your care team if they  continue or are bothersome): Bone, joint, or muscle pain Diarrhea Fatigue Loss of appetite Nausea Skin rash This list may not describe all possible side effects. Call your doctor for medical advice about side effects. You may report side effects to FDA at 1-800-FDA-1088. Where should I keep my medication? This medication is given in a hospital or clinic. It will not be stored at home. NOTE: This sheet is a summary. It may not cover all possible information. If you have questions about this medicine, talk to your doctor, pharmacist, or health care provider.  2023 Elsevier/Gold Standard (2021-12-06 00:00:00)

## 2022-10-13 NOTE — Progress Notes (Addendum)
Patient seen by MD today  Vitals are within treatment parameters.  Labs reviewed: and are within treatment parameters.  Per physician team, patient is ready for treatment and there are NO modifications to the treatment plan. Please use peripheral IV -port procedure pending today at 2:30.

## 2022-10-14 ENCOUNTER — Emergency Department (HOSPITAL_COMMUNITY)
Admission: EM | Admit: 2022-10-14 | Discharge: 2022-10-14 | Disposition: A | Discharge status: Home / Self Care | Payer: Medicare Other | Attending: Emergency Medicine | Admitting: Emergency Medicine

## 2022-10-14 ENCOUNTER — Encounter (HOSPITAL_COMMUNITY): Payer: Self-pay

## 2022-10-14 ENCOUNTER — Other Ambulatory Visit (HOSPITAL_COMMUNITY): Payer: Self-pay | Admitting: Interventional Radiology

## 2022-10-14 ENCOUNTER — Ambulatory Visit (HOSPITAL_COMMUNITY)
Admission: RE | Admit: 2022-10-14 | Discharge: 2022-10-14 | Disposition: A | Discharge status: Home / Self Care | Payer: Medicare Other | Source: Ambulatory Visit | Attending: Interventional Radiology | Admitting: Interventional Radiology

## 2022-10-14 DIAGNOSIS — Y710 Diagnostic and monitoring cardiovascular devices associated with adverse incidents: Secondary | ICD-10-CM | POA: Diagnosis not present

## 2022-10-14 DIAGNOSIS — C3490 Malignant neoplasm of unspecified part of unspecified bronchus or lung: Secondary | ICD-10-CM | POA: Insufficient documentation

## 2022-10-14 DIAGNOSIS — I97618 Postprocedural hemorrhage and hematoma of a circulatory system organ or structure following other circulatory system procedure: Secondary | ICD-10-CM | POA: Diagnosis not present

## 2022-10-14 DIAGNOSIS — R58 Hemorrhage, not elsewhere classified: Secondary | ICD-10-CM

## 2022-10-14 DIAGNOSIS — L7622 Postprocedural hemorrhage and hematoma of skin and subcutaneous tissue following other procedure: Secondary | ICD-10-CM | POA: Diagnosis not present

## 2022-10-14 HISTORY — PX: IR RADIOLOGIST EVAL & MGMT: IMG5224

## 2022-10-14 MED ORDER — LIDOCAINE HCL (PF) 2 % IJ SOLN
INTRAMUSCULAR | Status: AC
  Filled 2022-10-14: qty 10

## 2022-10-14 MED ORDER — LIDOCAINE-EPINEPHRINE 1 %-1:100000 IJ SOLN
INTRAMUSCULAR | Status: AC
  Filled 2022-10-14: qty 1

## 2022-10-14 NOTE — ED Notes (Signed)
Pt d/c with instructions. All questions answered. NAD. Stable condition at time of d/c

## 2022-10-14 NOTE — ED Provider Notes (Signed)
[facility] EMERGENCY DEPARTMENT AT [facility] Provider Note   CSN: [id number] Arrival date & time: 10/14/22  [id number]     History  Chief Complaint  Patient presents with   Post-op Problem    Pedro Pope. is a 82 y.o. male.  82 yo M with a chief complaints of bleeding from his surgical site.  Patient had his port removed after it was thought to be infected.  He is on doxycycline.  Bleeding saturated through the dressing that they had put on after the removal.  He did not change the dressing at home.  Not feeling lightheaded or dizzy.  Not on blood thinners.        Home Medications Prior to Admission medications   Medication Sig Start Date End Date Taking? Authorizing Provider  acetaminophen (TYLENOL) 500 MG tablet Take 500 mg by mouth every 6 (six) hours as needed for mild pain or moderate pain.    [provider]  albuterol (VENTOLIN HFA) 108 (90 Base) MCG/ACT inhaler Inhale 1-2 puffs into the lungs every 6 (six) hours as needed. 09/30/22   Parrett, Fonnie Mu, NP  ascorbic acid (VITAMIN C) 500 MG tablet Take 1,000 mg by mouth daily. 11/01/14   [provider]  Blood Pressure Monitor KIT For daily blood pressure monitoring;  Arm Cuff 06/05/15   Lorretta Harp, MD  carvedilol (COREG) 3.125 MG tablet Take 1 tablet (3.125 mg total) by mouth daily. Take 3.125 mg daily 09/10/22   Lorretta Harp, MD  Cholecalciferol (VITAMIN D3) 25 MCG (1000 UT) CAPS Take 1 capsule by mouth daily.    [provider]  colchicine 0.6 MG tablet Take 1 tablet twice a day by oral route. 07/26/22   [provider]  doxycycline (VIBRA-TABS) 100 MG tablet Take 1 tablet (100 mg total) by mouth 2 (two) times daily. 10/13/22   Curt Bears, MD  escitalopram (LEXAPRO) 5 MG tablet Take 5 mg by mouth daily.    [provider]  hydrOXYzine (ATARAX) 10 MG tablet Take 1 tablet (10 mg total) by mouth 3 (three) times daily as needed. 08/19/22   Heilingoetter,  Cassandra L, PA-C  lidocaine-prilocaine (EMLA) cream Apply 1 Application topically as needed. 03/18/22   Heilingoetter, Cassandra L, PA-C  nystatin (MYCOSTATIN/NYSTOP) powder Apply 1 Application topically 3 (three) times daily. 08/19/22   Heilingoetter, Cassandra L, PA-C  oxyCODONE (ROXICODONE) 5 MG immediate release tablet Take 0.5-1 tablets (2.5-5 mg total) by mouth every 4 (four) hours as needed. 01/23/22 01/23/23  Dwyane Dee, MD  PROCTO-MED [facility] 2.5 % rectal cream Apply topically. 05/19/22   [provider]  triamcinolone cream (KENALOG) 0.1 % Apply 1 Application topically 2 (two) times daily. 08/19/22   Heilingoetter, Cassandra L, PA-C  umeclidinium bromide (INCRUSE ELLIPTA) 62.5 MCG/ACT AEPB Inhale 1 puff into the lungs daily. 07/30/22   Brand Males, MD      Allergies    Penicillins, Clindamycin, Corn syrup [glucose], and Aspirin adult low [aspirin]    Review of Systems   Review of Systems  Physical Exam Updated Vital Signs BP 136/76   Pulse (!) 58   Temp (!) 97.4 F (36.3 C) (Oral)   Resp 16   SpO2 98%  Physical Exam Vitals and nursing note reviewed.  Constitutional:      Appearance: He is well-developed.  HENT:     Head: Normocephalic and atraumatic.  Eyes:     Pupils: Pupils are equal, round, and reactive to light.  Neck:  Vascular: No JVD.  Cardiovascular:     Rate and Rhythm: Normal rate and regular rhythm.     Heart sounds: No murmur heard.    No friction rub. No gallop.  Pulmonary:     Effort: No respiratory distress.     Breath sounds: No wheezing.  Abdominal:     General: There is no distension.     Tenderness: There is no abdominal tenderness. There is no guarding or rebound.  Musculoskeletal:        General: Normal range of motion.     Cervical back: Normal range of motion and neck supple.     Comments: Right chest wound open, old clotted blood is removed externally.  No obvious bleeding from the wound.  He has some small amounts of purulent  appearing drainage.  Skin:    Coloration: Skin is not pale.     Findings: No rash.  Neurological:     Mental Status: He is alert and oriented to person, place, and time.  Psychiatric:        Behavior: Behavior normal.     ED Results / Procedures / Treatments   Labs (all labs ordered are listed, but only abnormal results are displayed) Labs Reviewed - No data to display  EKG None  Radiology IR REMOVAL TUN ACCESS W/ PORT W/O FL MOD SED  Result Date: 10/13/2022 CLINICAL DATA:  Port erosion. EXAM: REMOVAL OF IMPLANTED TUNNELED PORT-A-CATH MEDICATIONS: None. ANESTHESIA/SEDATION: Local anesthetic was administered. The patient was continuously monitored during the procedure by the interventional radiology nurse under my direct supervision. FLUOROSCOPY TIME:  None PROCEDURE: Informed written consent was obtained from the patient after a discussion of the risk, benefits and alternatives to the procedure. The patient was positioned supine on the fluoroscopy table and the RIGHT chest Port-A-Cath site was prepped with chlorhexidine. A sterile gown and gloves were worn during the procedure. Local anesthesia was provided with 1% lidocaine with epinephrine. A timeout was performed prior to the initiation of the procedure. An incision was made overlying the Port-A-Cath with a #15 scalpel. Utilizing sharp and blunt dissection, the Port-A-Cath was removed completely. The pocked was irrigated with sterile saline. The wound was LEFT open secondary infection, and filled with Hydrogel. Dressings were applied. The patient tolerated the procedure well without immediate post procedural complication. FINDINGS: Successful removal of implant Port-A-Cath without immediate post procedural complication. IMPRESSION: Successful removal of a RIGHT chest implanted Port-A-Cath secondary to erosion. PLAN: The patient will return to Vascular Interventional Radiology (VIR) for routine wound evaluation and Hydrogel application in 3  days. Michaelle Birks, MD Vascular and Interventional Radiology Specialists [facility] Radiology Electronically Signed   By: Michaelle Birks M.D.   On: 10/13/2022 16:39   IR Radiologist Eval & Mgmt  Result Date: 10/13/2022 EXAM: ESTABLISHED PATIENT VISIT CHIEF COMPLAINT: See below HISTORY OF PRESENT ILLNESS: See below REVIEW OF SYSTEMS: See below PHYSICAL EXAMINATION: See below ASSESSMENT AND PLAN: Please refer to completed note in the electronic medical record on [place], MD Vascular and Interventional Radiology Specialists [facility] Radiology Electronically Signed   By: Michaelle Birks M.D.   On: 10/13/2022 15:30    Procedures Procedures    Medications Ordered in ED Medications - No data to display  ED Course/ Medical Decision Making/ A&P                             Medical Decision Making  82 yo  M with a chief complaints of bleeding from the wound after having his port removed.  This was due to infection on my record review.  He is currently on doxycycline.  His dressing was removed and had no continued bleeding.  Redressed here.  Oncology follow-up.  7:39 AM:  I have discussed the diagnosis/risks/treatment options with the patient.  Evaluation and diagnostic testing in the emergency department does not suggest an emergent condition requiring admission or immediate intervention beyond what has been performed at this time.  They will follow up with PCP, oncology. We also discussed returning to the ED immediately if new or worsening sx occur. We discussed the sx which are most concerning (e.g., sudden worsening pain, fever, inability to tolerate by mouth) that necessitate immediate return. Medications administered to the patient during their visit and any new prescriptions provided to the patient are listed below.  Medications given during this visit Medications - No data to display   The patient appears reasonably screen and/or stabilized for discharge and I doubt any other  medical condition or other [facility] requiring further screening, evaluation, or treatment in the ED at this time prior to discharge.          Final Clinical Impression(s) / ED Diagnoses Final diagnoses:  Bleeding    Rx / DC Orders ED Discharge Orders     None         Deno Etienne, DO 10/14/22 [id number]

## 2022-10-14 NOTE — Procedures (Signed)
Patient was seen today with concerns for bloody drainage and bloody dressing after having portacath removed and hydrogel placed in port pocket on 10/13/2022.  Dressing was removed, site was cleaned, new Hydrogel was placed in pocket and new, clean dressing was placed on portacath removal site.

## 2022-10-14 NOTE — Discharge Instructions (Signed)
Please keep an eye on the dressing.  Follow-up as instructed.  Please return for fever worsening bleeding.

## 2022-10-14 NOTE — ED Triage Notes (Signed)
Pt states that he had his port removed from his R chest yesterday due to infection and woke up last night with bleeding to dressing. Dressing is saturated

## 2022-10-16 ENCOUNTER — Other Ambulatory Visit (HOSPITAL_COMMUNITY): Payer: Medicare Other

## 2022-10-17 ENCOUNTER — Ambulatory Visit (HOSPITAL_COMMUNITY)
Admission: RE | Admit: 2022-10-17 | Discharge: 2022-10-17 | Disposition: A | Discharge status: Home / Self Care | Payer: Medicare Other | Source: Ambulatory Visit | Attending: Interventional Radiology | Admitting: Interventional Radiology

## 2022-10-17 ENCOUNTER — Encounter (HOSPITAL_COMMUNITY): Payer: Self-pay | Admitting: Radiology

## 2022-10-17 DIAGNOSIS — T82518A Breakdown (mechanical) of other cardiac and vascular devices and implants, initial encounter: Secondary | ICD-10-CM | POA: Diagnosis not present

## 2022-10-17 DIAGNOSIS — C3490 Malignant neoplasm of unspecified part of unspecified bronchus or lung: Secondary | ICD-10-CM

## 2022-10-17 HISTORY — PX: IR RADIOLOGIST EVAL & MGMT: IMG5224

## 2022-10-17 NOTE — Procedures (Signed)
Pt seen for continued care of port removal site. He was seen by Rowe Robert, PA. See PA note; Asissted in removal of old bandages, surrounding skin was cleaned; pocket was flushed with NS. New Hydrogel packing was inserted and a new Tefla dressing with a tegaderm bandage was applied. Pt will call us to schedule another follow-up appointment for the middle of next week.

## 2022-10-20 ENCOUNTER — Ambulatory Visit (HOSPITAL_COMMUNITY)
Admission: RE | Admit: 2022-10-20 | Discharge: 2022-10-20 | Disposition: A | Discharge status: Home / Self Care | Payer: Medicare Other | Source: Ambulatory Visit | Attending: Interventional Radiology | Admitting: Interventional Radiology

## 2022-10-20 ENCOUNTER — Other Ambulatory Visit (HOSPITAL_COMMUNITY): Payer: Self-pay | Admitting: Interventional Radiology

## 2022-10-20 ENCOUNTER — Encounter (HOSPITAL_COMMUNITY): Payer: Self-pay | Admitting: Radiology

## 2022-10-20 ENCOUNTER — Telehealth: Payer: Self-pay

## 2022-10-20 DIAGNOSIS — C3490 Malignant neoplasm of unspecified part of unspecified bronchus or lung: Secondary | ICD-10-CM

## 2022-10-20 HISTORY — PX: IR RADIOLOGIST EVAL & MGMT: IMG5224

## 2022-10-20 HISTORY — PX: IR PATIENT EVAL TECH 0-60 MINS: IMG5564

## 2022-10-20 NOTE — Progress Notes (Signed)
Pt presents to IR for continued care of right port removal site wound care. Right side port was removed 10/13/22 due to port infection. He underwent wound site care in IR 10/17/22 where port pocket was flushed and hydrogel infused.  Today port site noted to be closed with no open pocket. The incision site has healing granular tissue with no redness, oozing, bleeding or warmth. Pt denies fever, chills, pain or tenderness. A thin layer of Hydrogel was spread over the incision site. Telfa dressing was placed and sealed with Tegaderm. Pt was advised to keep the area clean and dry. He was educated to remove if bandage becomes wet and replace dressing. Pt verbalized understanding of these instructions.  Image placed under media.    Narda Rutherford, AGNP-BC 10/20/2022, 12:29 PM

## 2022-10-20 NOTE — Procedures (Signed)
The patient was brought into the exam room. The patient then removed his shirt. I then removed the dressing from over the wound site Pt seen for continued care of port removal site. He was seen by Narda Rutherford, NP. See NP note; Asissted in removal of old bandages, surrounding skin was cleaned. New Hydrogel packing was inserted and a new Tefla dressing with a tegaderm bandage was applied. Pt is scheduled for another follow-up appointment for the Thursday, 10/23/2022.

## 2022-10-20 NOTE — Telephone Encounter (Signed)
     Patient  visit on 2/27 at [facility] you been able to follow up with your primary care physician? Yes   The patient was or was not able to obtain any needed medicine or equipment. Yes    Are there diet recommendations that you are having difficulty following? Na   Patient expresses understanding of discharge instructions and education provided has no other needs at this time.  Yes      [facility] [phone] [address], [place], [place] [address] Phone: [phone] Email: [email or web address]

## 2022-10-23 ENCOUNTER — Ambulatory Visit (HOSPITAL_COMMUNITY)
Admission: RE | Admit: 2022-10-23 | Discharge: 2022-10-23 | Disposition: A | Discharge status: Home / Self Care | Payer: Medicare Other | Source: Ambulatory Visit | Attending: Interventional Radiology | Admitting: Interventional Radiology

## 2022-10-23 ENCOUNTER — Other Ambulatory Visit (HOSPITAL_COMMUNITY): Payer: Self-pay | Admitting: Interventional Radiology

## 2022-10-23 DIAGNOSIS — S21101A Unspecified open wound of right front wall of thorax without penetration into thoracic cavity, initial encounter: Secondary | ICD-10-CM | POA: Insufficient documentation

## 2022-10-23 DIAGNOSIS — C3490 Malignant neoplasm of unspecified part of unspecified bronchus or lung: Secondary | ICD-10-CM

## 2022-10-23 DIAGNOSIS — Z95828 Presence of other vascular implants and grafts: Secondary | ICD-10-CM

## 2022-10-23 DIAGNOSIS — Z4800 Encounter for change or removal of nonsurgical wound dressing: Secondary | ICD-10-CM | POA: Diagnosis not present

## 2022-10-23 DIAGNOSIS — Y848 Other medical procedures as the cause of abnormal reaction of the patient, or of later complication, without mention of misadventure at the time of the procedure: Secondary | ICD-10-CM | POA: Insufficient documentation

## 2022-10-23 HISTORY — PX: IR PATIENT EVAL TECH 0-60 MINS: IMG5564

## 2022-10-23 NOTE — Progress Notes (Signed)
Patient was seen today for follow-up wound care following removal of port on 10/13/22 d/t erosion of skin at the port site. The patient reports feeling well on today's visit. He denies pain at the site, nausea, vomiting, fever, and chills. He does endorse some occasional mild itching at the site, but states that he does not scratch it.   On exam today, there is a small open wound in the upper right chest. There is no surrounding erythema, induration, or visible drainage from the wound. Area around the wound is non-tender. Wound was irrigated with sterile saline and hydrogel was placed into the wound. A dressing was placed over the wound. Patient was advised to call if he experiences fever, chills, drainage from the wound, pain at the wound site, or other concern for infection develops. Patient voiced his understanding.  The patient was discharged in good spirits. Lura Em, PA-C 10/23/2022

## 2022-10-27 ENCOUNTER — Other Ambulatory Visit (HOSPITAL_COMMUNITY): Payer: Self-pay | Admitting: Interventional Radiology

## 2022-10-27 ENCOUNTER — Other Ambulatory Visit (HOSPITAL_COMMUNITY): Payer: Self-pay | Admitting: Student

## 2022-10-27 ENCOUNTER — Ambulatory Visit (HOSPITAL_COMMUNITY)
Admission: RE | Admit: 2022-10-27 | Discharge: 2022-10-27 | Disposition: A | Discharge status: Home / Self Care | Payer: Medicare Other | Source: Ambulatory Visit | Attending: Student | Admitting: Student

## 2022-10-27 DIAGNOSIS — Z4801 Encounter for change or removal of surgical wound dressing: Secondary | ICD-10-CM | POA: Insufficient documentation

## 2022-10-27 DIAGNOSIS — Z95828 Presence of other vascular implants and grafts: Secondary | ICD-10-CM

## 2022-10-27 DIAGNOSIS — C3490 Malignant neoplasm of unspecified part of unspecified bronchus or lung: Secondary | ICD-10-CM | POA: Insufficient documentation

## 2022-10-27 HISTORY — PX: IR PATIENT EVAL TECH 0-60 MINS: IMG5564

## 2022-10-27 NOTE — Progress Notes (Signed)
  Pt presents to IR for continued care of right port removal site wound care. Right side port was removed 10/13/22 due to port infection. He underwent wound site care in IR 10/17/22, 10/20/22 and 10/23/22 with hydrogel.  Pocket is closed. Incision has not completely closed but is healing well. There is no redness, oozing, bleeding or warmth. Pt denies fever, chills, pain or tenderness. A thin layer of Hydrogel was spread over the incision site. Telfa dressing was placed and sealed with Tegaderm. Pt was advised to keep the area clean and dry. He was educated to remove if bandage becomes wet and replace dressing. Pt verbalized understanding of these instructions. He will return in 3 days to evaluate site    Narda Rutherford, AGNP-BC 10/27/2022, 12:03 PM

## 2022-10-28 DIAGNOSIS — C61 Malignant neoplasm of prostate: Secondary | ICD-10-CM | POA: Diagnosis not present

## 2022-10-28 DIAGNOSIS — N5231 Erectile dysfunction following radical prostatectomy: Secondary | ICD-10-CM | POA: Diagnosis not present

## 2022-10-28 DIAGNOSIS — R310 Gross hematuria: Secondary | ICD-10-CM | POA: Diagnosis not present

## 2022-10-28 DIAGNOSIS — R972 Elevated prostate specific antigen [PSA]: Secondary | ICD-10-CM | POA: Diagnosis not present

## 2022-10-29 ENCOUNTER — Encounter (HOSPITAL_COMMUNITY): Payer: Self-pay | Admitting: Radiology

## 2022-10-29 ENCOUNTER — Other Ambulatory Visit (HOSPITAL_COMMUNITY): Payer: Self-pay | Admitting: Interventional Radiology

## 2022-10-29 DIAGNOSIS — C3490 Malignant neoplasm of unspecified part of unspecified bronchus or lung: Secondary | ICD-10-CM

## 2022-10-29 NOTE — Procedures (Signed)
Pt presents to IR for continued care of right port removal site wound care. Right side port was removed 10/13/22 due to port infection. He underwent wound site care in IR 10/17/22, 10/20/22 and 10/23/22 with hydrogel.  Pocket is closed. Incision has not completely closed but is healing well. There is no redness, oozing, bleeding or warmth. Pt denies fever, chills, pain or tenderness. A thin layer of Hydrogel was spread over the incision site. Telfa dressing was placed and sealed with Tegaderm. Pt was advised to keep the area clean and dry. He was educated to remove if bandage becomes wet and replace dressing. Pt verbalized understanding of these instructions. He will return in 3 days to evaluate site Seen by Narda Rutherford NP

## 2022-10-30 ENCOUNTER — Encounter (HOSPITAL_COMMUNITY): Payer: Self-pay

## 2022-10-30 ENCOUNTER — Ambulatory Visit (HOSPITAL_COMMUNITY)
Admission: RE | Admit: 2022-10-30 | Discharge: 2022-10-30 | Disposition: A | Discharge status: Home / Self Care | Payer: Medicare Other | Source: Ambulatory Visit | Attending: Student | Admitting: Student

## 2022-10-30 ENCOUNTER — Other Ambulatory Visit (HOSPITAL_COMMUNITY): Payer: Self-pay | Admitting: Interventional Radiology

## 2022-10-30 DIAGNOSIS — Z4801 Encounter for change or removal of surgical wound dressing: Secondary | ICD-10-CM | POA: Insufficient documentation

## 2022-10-30 DIAGNOSIS — C3490 Malignant neoplasm of unspecified part of unspecified bronchus or lung: Secondary | ICD-10-CM

## 2022-10-30 DIAGNOSIS — Z95828 Presence of other vascular implants and grafts: Secondary | ICD-10-CM

## 2022-10-30 HISTORY — PX: IR PATIENT EVAL TECH 0-60 MINS: IMG5564

## 2022-10-30 NOTE — Progress Notes (Signed)
Patient presented for site check at his port removal site.  Site cleaned and exposed. Pocket is closed with existing wound bed intact .No open wounds or drainage noted. No erythema or warmth.  No evidence of ongoing infection.  Hydrogel no longer needed.  Clean, dry dressing applied.  Patient to return in 1 week for site check.   Brynda Greathouse, MS RD PA-C

## 2022-10-30 NOTE — Procedures (Deleted)
Patient was seen for port site check post removal, hydrogel was put in port pocket and sterile dressing was applied. Patient will return next Thursday, 11/06/22

## 2022-11-04 ENCOUNTER — Telehealth: Payer: Self-pay | Admitting: Internal Medicine

## 2022-11-04 ENCOUNTER — Encounter: Payer: Self-pay | Admitting: Internal Medicine

## 2022-11-04 ENCOUNTER — Ambulatory Visit (INDEPENDENT_AMBULATORY_CARE_PROVIDER_SITE_OTHER): Payer: Medicare Other | Admitting: Internal Medicine

## 2022-11-04 VITALS — BP 104/60 | HR 68 | Temp 97.8°F | Ht 68.5 in | Wt 142.2 lb

## 2022-11-04 DIAGNOSIS — J84112 Idiopathic pulmonary fibrosis: Secondary | ICD-10-CM | POA: Diagnosis not present

## 2022-11-04 DIAGNOSIS — J439 Emphysema, unspecified: Secondary | ICD-10-CM

## 2022-11-04 DIAGNOSIS — J8410 Pulmonary fibrosis, unspecified: Secondary | ICD-10-CM | POA: Diagnosis not present

## 2022-11-04 NOTE — Progress Notes (Signed)
OV 07/17/2022 -transfer of care to the ILD center wit Dr. Chase Caller.  Previous patient of Dr. Silas Flood  Subjective:  Patient ID: Pedro Pope., male , DOB: [birth year] , age 82 y.o. , MRN: [id number] , ADDRESS: [place] [id number] PCP Shon Baton, MD Patient Care Team: Shon Baton, MD as PCP - General (Internal Medicine) Lorretta Harp, MD as PCP - Cardiology (Cardiology) Hunsucker, Bonna Gains, MD as Consulting Physician (Pulmonary Disease)  This Provider for this visit: Treatment Team:  Attending Provider: Brand Males, MD    07/17/2022 -   Chief Complaint  Patient presents with   Follow-up    Pt states he has been doing okay since last visit.     HPI Dyami Pope. 82 y.o. -presents with his wife and son [name].  He is originally from [place].  He is still in [employer] but also [place] he is retired he lives in [place] to be near his son.  History is gained from review of the chart but also talking to him and his wife.  He is a former smoker.  He has a previous history of prostate cancer.  In the summer of 2023 had malignant pleural effusion on the left side from lung cancer and was status post Pleurx which was removed in the fall 2023.  He has been undergoing immunotherapy with Dr. Julien Nordmann.  He is tolerating it well.  The cancer is improved.  He does have some skin lesions on his right forearm.  He wants this gone.  We have advised him to talk to Dr. Julien Nordmann about it.  During the initial diagnose of cancer in the workup he was on 4 L of oxygen but currently he does not feel he needs it and does not using it.  His wife and son feel that physician needs to ascertain if he needs it.  He had failure to thrive and lost a lot of weight from baseline 145 pounds 210 pounds.  He is currently regained much of it and is at 135 pounds approximately.  Overall he feels good he is doing his activities of daily living.  He is able to  do stuff around the house but when he goes to [employer] with the son he gets quite short of breath.  Current walking desaturation test is normal without significant desaturation although he did drop 3 points   Review of his CT from 2 and half years ago CT abdomen lung image when he had hematuria showed some ILD in the lung bases.  There is no clear-cut honeycombing in my opinion but was subpleural bilateral bibasal with reticulation.  Current CT scan of the chest shows classic UIP features in my view.  There is also associated emphysema.  ILD risk factors/IPF risk factors -Former smoker -Age greater than 67 and Caucasian ethnicity -Progression with classic UIP -He gives a history of Raynaud's but no autoimmune disease -No serology available -Ongoing immunotherapy for lung cancer -Associated emphysema    CT Chest data - HRCT 06/19/22    IMPRESSION: 1. Diminished left pleural effusion, with a trace residual and some associated pleural thickening, consistent with treated pleural metastatic disease. 2. Hypodense lesion of the posterior liver dome, hepatic segment VII is diminished in size, measuring no greater than 0.4 cm, consistent with treatment response of a small hepatic metastasis. 3. Status post prostatectomy. Small focus of contrast enhancement in the prostatectomy bed, unchanged compared to prior examination  although of uncertain significance. Correlate with PSA. 4. Unchanged moderate UIP pattern pulmonary fibrosis. 5. Emphysema. 6. Coronary artery disease. 7. Cholelithiasis. 8. Nonobstructive right nephrolithiasis.   Aortic Atherosclerosis (ICD10-I70.0) and Emphysema (ICD10-J43.9).     Electronically Signed   By: Delanna Ahmadi M.D.   On: 06/22/2022 15:06    No results found.    PFT      No data to display         ONCOLYG HX Oct 2023 - Dr Bretta Bang  DIAGNOSIS: Extensive stage (T3, N2, M1b) small cell lung cancer presented with obstructive left upper lobe lung  mass in addition to mediastinal lymphadenopathy and malignant left pleural effusion as well as pleural-based metastasis as well as suspicious liver metastasis diagnosed in June 2023.   PRIOR THERAPY: None   CURRENT THERAPY: Palliative systemic chemotherapy with carboplatin initiated for AUC of 4 on day 1 and etoposide 80 Mg/M2 on days 1, 2 and 3 with Cosela before the chemotherapy and Imfinzi 1500 Mg IV on day 1 every 3 weeks.  Status post 5 cycles.  Starting from cycle #5 the patient will be on maintenance treatment with Imfinzi 1500 Mg IV every 4 weeks. will see him back for follow-up visit in 4 weeks for evaluation with repeat CT scan of the chest, abdomen and pelvis for restaging of his disease.      09/30/2022 Follow up : ILD , Extensive stage Small cell lung cancer    82 year old male seen for evaluation of  Interstitial lung disease July 17, 2022 with Dr. Chase Caller  Diagnosed with extensive stage small cell lung cancer diagnosed in June 2023 with obstructive left upper lobe lung mass in addition to mediastinal lymphadenopathy and malignant left pleural effusion as well as pleural-based metastasis and suspected liver metastasis. S/p Pleurx catheter (removed Fall 2023) followed by oncology on systemic chemo with carboplatin and immunotherapy with Imfinzi.    TEST/EVENTS :  ILD risk factors/IPF risk factors -Former smoker -Age greater than 34 and Caucasian ethnicity -Progression with classic UIP -He gives a history of Raynaud's but no autoimmune disease -No serology available -Ongoing immunotherapy for lung cancer -Associated emphysema  CT chest June 19, 2022 showed a moderate pulmonary fibrosis with apical to basal gradient, irregular peripheral interstitial opacity and traction bronchiectasis along with honeycombing at the bases.  Moderate emphysema.  Decreased left pleural effusion with associated pleural thickening  CT chest August 25, 2022 showed interval development of  abnormal lymph node in the gastrohepatic ligament.  Previous liver lesion not well-seen.  No new liver lesions.  Stable pulmonary fibrosis and emphysematous chang Patient returns for a 62-month follow-up.  Patient was seen last visit for an evaluation of interstitial lung disease.  He is undergoing treatment for extensive stage small cell lung cancer.  A CT chest has shown pulmonary fibrosis.  Felt to have IPF, CT chest shows a UIP pattern.  As above he has several risk factors for IPF.  Patient was recommended to begin Esbriet.  He is currently awaiting insurance approval.  Patient was also tried on Spiriva and Incruse in the past but says it really did not make much difference in his breathing and they are not very affordable.  Patient is on oxygen 2 L with activity and at bedtime.  Patient says he is not been using his oxygen during the daytime feels that he really does not need it at this time.  Walk test in the office showed no significant desaturations on room air.  He  had an overnight oximetry test results are pending.. Patient says overall he is doing okay.  He gets winded with heavy activities.  Denies any hemoptysis, chest pain, orthopnea or increased edema. Patient is undergoing some treatment for Port-A-Cath infection.  He is being followed by oncology and  treated with antibiotics.    TEST/EVENTS :  ILD risk factors/IPF risk factors -Former smoker -Age greater than 26 and Caucasian ethnicity -Progression with classic UIP -He gives a history of Raynaud's but no autoimmune disease -No serology available -Ongoing immunotherapy for lung cancer -Associated emphysema  CT chest June 19, 2022 showed a moderate pulmonary fibrosis with apical to basal gradient, irregular peripheral interstitial opacity and traction bronchiectasis along with honeycombing at the bases.  Moderate emphysema.  Decreased left pleural effusion with associated pleural thickening  CT chest August 25, 2022 showed  interval development of abnormal lymph node in the gastrohepatic ligament.  Previous liver lesion not well-seen.  No new liver lesions.  Stable pulmonary fibrosis and emphysematous chang  OV 11/04/2022  Subjective:  Patient ID: Pedro Pope., male , DOB: 12-29-1940 , age 87 y.o. , MRN: [id number] , ADDRESS: [place] [id number] PCP Shon Baton, MD Patient Care Team: Shon Baton, MD as PCP - General (Internal Medicine) Lorretta Harp, MD as PCP - Cardiology (Cardiology) Hunsucker, Bonna Gains, MD as Consulting Physician (Pulmonary Disease)  This Provider for this visit: Treatment Team:  Attending Provider: Brand Males, MD    11/04/2022 -   Chief Complaint  Patient presents with   Follow-up    F/u no concerns    #IPF #Combined emphysema #Extensive stage T3, N2, M1 B small cell lung cancer with obstructive left upper lobe lung mass and mediastinal adenopathy and malignant left pleural effusion and pleural mets and suspicious liver mets diagnosed in June 2023.  -On Imfinzi every 4 weeks by Dr. Julien Nordmann   HPI Pedro Pope. 82 y.o. -returns for follow-up.  He continues to be stable from a respiratory standpoint.  He came driving today.  He has got good functional status ECOG is 0.  He had an infected Port-A-Cath and this has been removed.  For his emphysema he is on Incruse.  He has seen nurse practitioner Tammy.  He was started on nighttime oxygen after which his shortness of breath is better according to his history.  He does not feel any worse there is no pulmonary function test today.  I did notice that he is yet to start his pirfenidone.  I spoke to him about this and he says it is too expensive and not affordable.  I did discuss about the inflation reduction act and the upfront expectation and patient payment of up to 3000- $3500.  But he says it is too expensive.  He is asking for charity programs.  I did indicate to him under the new inflation reduction  and charity programs can be hard to come by but I did indicate to him that I would reach out to our pharmacy team.  Simple office walk 185 feet x  3 laps goal with forehead probe 07/17/2022    O2 used ra   Number laps completed 3   Comments about pace avg   Resting Pulse Ox/HR 100% and 59/min   Final Pulse Ox/HR 97% and 88/min   Desaturated </= 88% no   Desaturated <= 3% points yes   Got Tachycardic >/= 90/min no   Symptoms at end of test Mild dyspnea  Miscellaneous comments x      PFT     Latest Ref Rng & Units 09/30/2022    5:04 PM  PFT Results  FVC-Predicted Pre % 103   FVC-Post L 3.76   FVC-Predicted Post % 102   Pre FEV1/FVC % % 74   Post FEV1/FCV % % 72   FEV1-Pre L 2.79   FEV1-Predicted Pre % 108   FEV1-Post L 2.72   DLCO uncorrected ml/min/mmHg 9.13   DLCO UNC% % 40   DLCO corrected ml/min/mmHg 9.73   DLCO COR %Predicted % 42   DLVA Predicted % 49   TLC L 6.45   TLC % Predicted % 96   RV % Predicted % 107        has a past medical history of Anosmia, CAD (coronary artery disease), native coronary artery, Chronic headaches, 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   reports that he quit smoking about 30 years ago. His smoking use included cigarettes. He has never used smokeless tobacco.  Past Surgical History:  Procedure Laterality Date   CORONARY ANGIOPLASTY WITH STENT PLACEMENT  10/30/14   STEMI s/p DES to proximal LAD; residual 80 % proximal and 60% mid LCx diease   IR IMAGING GUIDED PORT INSERTION  03/12/2022   IR PATIENT EVAL TECH 0-60 MINS  10/20/2022   IR PATIENT EVAL TECH 0-60 MINS  10/27/2022   IR PATIENT EVAL TECH 0-60 MINS  10/30/2022   IR RADIOLOGIST EVAL & MGMT  10/02/2022   IR RADIOLOGIST EVAL & MGMT  10/13/2022   IR RADIOLOGIST EVAL & MGMT  10/14/2022   IR RADIOLOGIST  EVAL & MGMT  10/17/2022   IR RADIOLOGIST EVAL & MGMT  10/20/2022   IR REMOVAL TUN ACCESS W/ PORT W/O FL MOD SED  10/13/2022   LEFT HEART CATHETERIZATION WITH CORONARY ANGIOGRAM N/A 10/30/2014   Procedure: LEFT HEART CATHETERIZATION WITH CORONARY ANGIOGRAM;  Surgeon: Lorretta Harp, MD;  Location: [facility] CATH LAB;  Service: Cardiovascular;  Laterality: N/A;   PERCUTANEOUS CORONARY STENT INTERVENTION (PCI-S)  10/30/2014   Procedure: PERCUTANEOUS CORONARY STENT INTERVENTION (PCI-S);  Surgeon: Lorretta Harp, MD;  Location[facility] C[facility] LAB;  Service: Cardiovascular;;   PROSTATECTOMY     THORACENTESIS Left 12/31/2021   Procedure: THORACENTESIS;  Surgeon: Candee Furbish, MD;  Location: [facility] ENDOSCOPY;  Service: Pulmonary;  Laterality: Left;   TONSILLECTOMY     VIDEO ASSISTED THORACOSCOPY (VATS)/DECORTICATION Left 01/21/2022   Procedure: VIDEO ASSISTED THORACOSCOPY (VATS)/DECORTICATION;  Surgeon: Lajuana Matte, MD;  Location: [facility];  Service: Thoracic;  Laterality: Left;    Allergies  Allergen Reactions   Penicillins Anaphylaxis   Clindamycin Other (See Comments)   Corn Syrup [Glucose] Other (See Comments)    High fructose corn syrup causes gout   Aspirin Adult Low [Aspirin] Itching and Rash    Dr. [name] suspects that long term caused a rash, short term use appears to be ok    Immunization History  Administered Date(s) Administered   Fluad Quad(high Dose 65+) 05/26/2022   Influenza Split 05/20/2012, 09/01/2013   Influenza, High Dose Seasonal PF 04/23/2017   Influenza, Quadrivalent, Recombinant, Inj, Pf 04/08/2018, 07/18/2020   Influenza,inj,Quad PF,6+ Mos 11/01/2014   PFIZER Comirnaty(Gray Top)Covid-19 Tri-Sucrose Vaccine 12/16/2019, 05/18/2020   PFIZER(Purple Top)SARS-COV-2 Vaccination 10/14/2019, 11/08/2019   Pneumococcal Conjugate-13 09/01/2013, 10/01/2017   Pneumococcal Polysaccharide-23 11/01/2014   Tdap 08/18/2008  Zoster, Live 09/01/2013    Family History  Problem Relation Age of  Onset   Cancer Other    Coronary artery disease Brother 34       s/p CABG   Diabetes Other    Hyperlipidemia Other    Hypertension Other    Gout Other      Current Outpatient Medications:    ascorbic acid (VITAMIN C) 500 MG tablet, Take 1,000 mg by mouth daily., Disp: , Rfl:    ascorbic acid (VITAMIN C) 500 MG tablet, Take 250 mg by mouth 2 (two) times daily., Disp: , Rfl:    Blood Pressure Monitor KIT, For daily blood pressure monitoring;  Arm Cuff, Disp: 1 each, Rfl: 0   carvedilol (COREG) 3.125 MG tablet, Take 1 tablet (3.125 mg total) by mouth daily. Take 3.125 mg daily, Disp: , Rfl:    Cholecalciferol (VITAMIN D3) 25 MCG (1000 UT) CAPS, Take 1 capsule by mouth daily., Disp: , Rfl:    colchicine 0.6 MG tablet, Take 1 tablet twice a day by oral route., Disp: , Rfl:    escitalopram (LEXAPRO) 5 MG tablet, Take 5 mg by mouth daily., Disp: , Rfl:    hydrOXYzine (ATARAX) 10 MG tablet, Take 1 tablet (10 mg total) by mouth 3 (three) times daily as needed., Disp: 30 tablet, Rfl: 0   lidocaine-prilocaine (EMLA) cream, Apply 1 Application topically as needed., Disp: 30 g, Rfl: 2   nystatin (MYCOSTATIN/NYSTOP) powder, Apply 1 Application topically 3 (three) times daily., Disp: 15 g, Rfl: 0   PROCTO-MED HC 2.5 % rectal cream, Apply topically., Disp: , Rfl:    triamcinolone cream (KENALOG) 0.1 %, Apply 1 Application topically 2 (two) times daily., Disp: 80 g, Rfl: 0   acetaminophen (TYLENOL) 500 MG tablet, Take 500 mg by mouth every 6 (six) hours as needed for mild pain or moderate pain. (Patient not taking: Reported on 11/04/2022), Disp: , Rfl:    albuterol (VENTOLIN HFA) 108 (90 Base) MCG/ACT inhaler, Inhale 1-2 puffs into the lungs every 6 (six) hours as needed. (Patient not taking: Reported on 11/04/2022), Disp: 8 g, Rfl: 2   doxycycline (VIBRA-TABS) 100 MG tablet, Take 1 tablet (100 mg total) by mouth 2 (two) times daily. (Patient not taking: Reported on 11/04/2022), Disp: 14 tablet, Rfl: 0    oxyCODONE (ROXICODONE) 5 MG immediate release tablet, Take 0.5-1 tablets (2.5-5 mg total) by mouth every 4 (four) hours as needed. (Patient not taking: Reported on 11/04/2022), Disp: 30 tablet, Rfl: 0   umeclidinium bromide (INCRUSE ELLIPTA) 62.5 MCG/ACT AEPB, Inhale 1 puff into the lungs daily. (Patient not taking: Reported on 11/04/2022), Disp: 30 each, Rfl: 5      Objective:   Vitals:   11/04/22 1405  BP: 104/60  Pulse: 68  Temp: 97.8 F (36.6 C)  SpO2: 98%  Weight: 142 lb 3.2 oz (64.5 kg)  Height: 5' 8.5" (1.74 m)    Estimated body mass index is 21.31 kg/m as calculated from the following:   Height as of this encounter: 5' 8.5" (1.74 m).   Weight as of this encounter: 142 lb 3.2 oz (64.5 kg).  @WEIGHTCHANGE @  [employer]   11/04/22 1405  Weight: 142 lb 3.2 oz (64.5 kg)     Physical Exam    General: No distress.  Looks well.  Neuro: Alert and Oriented x 3. GCS 15. Speech normal Psych: Pleasant Resp:  Barrel Chest - no.  Wheeze - no, Crackles - yes base, No overt respiratory distress  CVS: Normal heart sounds. Murmurs - no Ext: Stigmata of Connective Tissue Disease - no HEENT: Normal upper airway. PEERL +. No post nasal drip        Assessment:       ICD-10-CM   1. IPF (idiopathic pulmonary fibrosis) (HCC)  [id number] Pulmonary function test    2. Pulmonary emphysema, unspecified emphysema type ([place])  J43.9     3. Combined pulmonary fibrosis and emphysema (CPFE) ([place])  J43.9    J84.10          Plan:     Patient Instructions     ICD-10-CM   1. IPF (idiopathic pulmonary fibrosis) ([place])  J84.112     2. ILD (interstitial lung disease) ([place])  J84.9     3. Pulmonary emphysema, unspecified emphysema type ([place])  J43.9     4. Combined pulmonary fibrosis and emphysema (CPFE) (HCC)  J43.9    J84.10       # IPF  - clinically stable but too bad not on esbriet due to cost but seems o2 at night is helping you  Plan - wil reach out to pharmacy to see how  we can make esbriet more affordable but you might not have a choice on this with CMS  #emphysema  PLan - continue incruse daily; currently stable    # Combined emphysema and pulmonary fibrosis  PLAN -  - Do full spirometry and dlco in 3 months - get RSV vaccine ASAP  - continue night o2  #Followup  - 12  weeks with Dr. Chase Caller 30-minute visit but after PFT  -symptoms score and walking desaturation test at follow-up    SIGNATURE    Dr. Brand Males, M.D., F.C.C.P,  Pulmonary and Critical Care Medicine Staff Physician, [place] Director - Interstitial Lung Disease  Program  Pulmonary [place] at [place], [place], [id number]  Pager: [phone], If no answer or between  15:00h - 7:00h: call [phone] Telephone: [phone]  2:53 PM 11/04/2022

## 2022-11-04 NOTE — Telephone Encounter (Signed)
Rx team 0 he says esbriet is not affordable. I tried to counsel hm about inflation reduction act. Any other choices?

## 2022-11-04 NOTE — Patient Instructions (Addendum)
ICD-10-CM   1. IPF (idiopathic pulmonary fibrosis) ([place])  J84.112     2. ILD (interstitial lung disease) ([place])  J84.9     3. Pulmonary emphysema, unspecified emphysema type ([place])  J43.9     4. Combined pulmonary fibrosis and emphysema (CPFE) (HCC)  J43.9    J84.10       # IPF  - clinically stable but too bad not on esbriet due to cost but seems o2 at night is helping you  Plan - wil reach out to pharmacy to see how we can make esbriet more affordable but you might not have a choice on this with CMS  #emphysema  PLan - continue incruse daily; currently stable    # Combined emphysema and pulmonary fibrosis  PLAN -  - Do full spirometry and dlco in 3 months - get RSV vaccine ASAP  - continue night o2  #Followup  - 12  weeks with Dr. Chase Caller 30-minute visit but after PFT  -symptoms score and walking desaturation test at follow-up

## 2022-11-06 ENCOUNTER — Other Ambulatory Visit (HOSPITAL_COMMUNITY): Payer: Self-pay | Admitting: Interventional Radiology

## 2022-11-06 ENCOUNTER — Encounter (HOSPITAL_COMMUNITY): Payer: Self-pay | Admitting: Radiology

## 2022-11-06 ENCOUNTER — Other Ambulatory Visit (HOSPITAL_COMMUNITY): Payer: Medicare Other

## 2022-11-06 ENCOUNTER — Ambulatory Visit (HOSPITAL_COMMUNITY)
Admission: RE | Admit: 2022-11-06 | Discharge: 2022-11-06 | Disposition: A | Discharge status: Home / Self Care | Payer: Medicare Other | Source: Ambulatory Visit | Attending: Student | Admitting: Student

## 2022-11-06 DIAGNOSIS — D62 Acute posthemorrhagic anemia: Secondary | ICD-10-CM | POA: Diagnosis not present

## 2022-11-06 DIAGNOSIS — R739 Hyperglycemia, unspecified: Secondary | ICD-10-CM | POA: Diagnosis not present

## 2022-11-06 DIAGNOSIS — E781 Pure hyperglyceridemia: Secondary | ICD-10-CM | POA: Diagnosis not present

## 2022-11-06 DIAGNOSIS — R946 Abnormal results of thyroid function studies: Secondary | ICD-10-CM | POA: Diagnosis not present

## 2022-11-06 DIAGNOSIS — C3490 Malignant neoplasm of unspecified part of unspecified bronchus or lung: Secondary | ICD-10-CM

## 2022-11-06 DIAGNOSIS — R030 Elevated blood-pressure reading, without diagnosis of hypertension: Secondary | ICD-10-CM | POA: Diagnosis not present

## 2022-11-06 DIAGNOSIS — N529 Male erectile dysfunction, unspecified: Secondary | ICD-10-CM | POA: Diagnosis not present

## 2022-11-06 DIAGNOSIS — M109 Gout, unspecified: Secondary | ICD-10-CM | POA: Diagnosis not present

## 2022-11-06 HISTORY — PX: IR PATIENT EVAL TECH 0-60 MINS: IMG5564

## 2022-11-06 NOTE — Procedures (Signed)
gned      Patient was seen today for follow-up wound care following removal of port on 10/13/22 d/t erosion of skin at the port site. The patient reports feeling well on today's visit. He denies pain at the site, nausea, vomiting, fever, and chills. He does endorse some occasional mild itching at the site, but states that he does not scratch it.    On exam today, there is a small open wound in the upper right chest. There is no surrounding erythema, induration, or visible drainage from the wound. Area around the wound is non-tender. Wound was irrigated with sterile saline and hydrogel was placed into the wound. A dressing was placed over the wound. Patient was advised to call if he experiences fever, chills, drainage from the wound, pain at the wound site, or other concern for infection develops. Patient voiced his understanding.  The patient was discharged in good spirits. Lura Em, PA-C 10/23/2022

## 2022-11-07 NOTE — Telephone Encounter (Signed)
Per previous telephone encounter:    Approval for pt assistance was obtained in December of 2023, however the pt declined services during the initial welcome call. I contacted pt for more information and spoke with his son, who informed me that they declined services because the pt was asked to NOT begin treatment until he had been medically cleared by his Oncologist. Was assured by pt's son that he would obtain f/u during the next appointment, and my direct office phone number was provided to him so that we could quickly get his treatment back on track once the clearance had been granted.  We have received no updates since that time and after briefly reviewing the notes from the two Oncology visits between then and now it does not appear that the [place] treatment has been discussed. Please advise if you would like [place] to proceed with reattempting enrollment into [place] without clearance and if pt is in agreement with the decision to move forward.

## 2022-11-10 ENCOUNTER — Encounter: Payer: Self-pay | Admitting: Medical Oncology

## 2022-11-10 ENCOUNTER — Inpatient Hospital Stay: Payer: Medicare Other

## 2022-11-10 ENCOUNTER — Other Ambulatory Visit: Payer: Self-pay

## 2022-11-10 ENCOUNTER — Inpatient Hospital Stay: Payer: Medicare Other | Attending: Internal Medicine | Admitting: Internal Medicine

## 2022-11-10 VITALS — BP 126/52 | HR 60 | Temp 97.6°F | Resp 16 | Wt 144.6 lb

## 2022-11-10 VITALS — BP 117/53 | HR 60 | Resp 16

## 2022-11-10 DIAGNOSIS — C3482 Malignant neoplasm of overlapping sites of left bronchus and lung: Secondary | ICD-10-CM | POA: Insufficient documentation

## 2022-11-10 DIAGNOSIS — C782 Secondary malignant neoplasm of pleura: Secondary | ICD-10-CM | POA: Insufficient documentation

## 2022-11-10 DIAGNOSIS — C3490 Malignant neoplasm of unspecified part of unspecified bronchus or lung: Secondary | ICD-10-CM

## 2022-11-10 DIAGNOSIS — Z5112 Encounter for antineoplastic immunotherapy: Secondary | ICD-10-CM | POA: Diagnosis not present

## 2022-11-10 LAB — CBC WITH DIFFERENTIAL (CANCER CENTER ONLY)
Abs Immature Granulocytes: 0.03 10*3/uL (ref 0.00–0.07)
Basophils Absolute: 0 10*3/uL (ref 0.0–0.1)
Basophils Relative: 0 %
Eosinophils Absolute: 0.3 10*3/uL (ref 0.0–0.5)
Eosinophils Relative: 3 %
HCT: 37.9 % — ABNORMAL LOW (ref 39.0–52.0)
Hemoglobin: 12.5 g/dL — ABNORMAL LOW (ref 13.0–17.0)
Immature Granulocytes: 0 %
Lymphocytes Relative: 23 %
Lymphs Abs: 2.1 10*3/uL (ref 0.7–4.0)
MCH: 30 pg (ref 26.0–34.0)
MCHC: 33 g/dL (ref 30.0–36.0)
MCV: 91.1 fL (ref 80.0–100.0)
Monocytes Absolute: 1 10*3/uL (ref 0.1–1.0)
Monocytes Relative: 11 %
Neutro Abs: 5.6 10*3/uL (ref 1.7–7.7)
Neutrophils Relative %: 63 %
Platelet Count: 171 10*3/uL (ref 150–400)
RBC: 4.16 MIL/uL — ABNORMAL LOW (ref 4.22–5.81)
RDW: 14.6 % (ref 11.5–15.5)
WBC Count: 9 10*3/uL (ref 4.0–10.5)
nRBC: 0 % (ref 0.0–0.2)

## 2022-11-10 LAB — CMP (CANCER CENTER ONLY)
ALT: 9 U/L (ref 0–44)
AST: 18 U/L (ref 15–41)
Albumin: 3.8 g/dL (ref 3.5–5.0)
Alkaline Phosphatase: 64 U/L (ref 38–126)
Anion gap: 5 (ref 5–15)
BUN: 20 mg/dL (ref 8–23)
CO2: 29 mmol/L (ref 22–32)
Calcium: 8.8 mg/dL — ABNORMAL LOW (ref 8.9–10.3)
Chloride: 109 mmol/L (ref 98–111)
Creatinine: 0.92 mg/dL (ref 0.61–1.24)
GFR, Estimated: >60 mL/min (ref >60)
Glucose, Bld: 77 mg/dL (ref 70–99)
Potassium: 3.8 mmol/L (ref 3.5–5.1)
Sodium: 143 mmol/L (ref 135–145)
Total Bilirubin: 0.4 mg/dL (ref 0.3–1.2)
Total Protein: 6.1 g/dL — ABNORMAL LOW (ref 6.5–8.1)

## 2022-11-10 MED ORDER — SODIUM CHLORIDE 0.9 % IV SOLN: Freq: Once | INTRAVENOUS | Status: AC

## 2022-11-10 MED ORDER — SODIUM CHLORIDE 0.9 % IV SOLN
1500.0000 mg | Freq: Once | INTRAVENOUS | Status: AC
  Administered 2022-11-10: 1500 mg via INTRAVENOUS
  Filled 2022-11-10: qty 30

## 2022-11-10 NOTE — Patient Instructions (Signed)
[facility]   Discharge Instructions: Thank you for choosing [facility] to provide your oncology and hematology care.   If you have a lab appointment with the Cancer Center, please go directly to the Cancer Center and check in at the registration area.   Wear comfortable clothing and clothing appropriate for easy access to any Portacath or PICC line.   We strive to give you quality time with your provider. You may need to reschedule your appointment if you arrive late (15 or more minutes).  Arriving late affects you and other patients whose appointments are after yours.  Also, if you miss three or more appointments without notifying the office, you may be dismissed from the clinic at the provider's discretion.      For prescription refill requests, have your pharmacy contact our office and allow 72 hours for refills to be completed.    Today you received the following chemotherapy and/or immunotherapy agents: Durvalumab (Imfinzi)      To help prevent nausea and vomiting after your treatment, we encourage you to take your nausea medication as directed.  BELOW ARE SYMPTOMS THAT SHOULD BE REPORTED IMMEDIATELY: *FEVER GREATER THAN 100.4 F (38 C) OR HIGHER *CHILLS OR SWEATING *NAUSEA AND VOMITING THAT IS NOT CONTROLLED WITH YOUR NAUSEA MEDICATION *UNUSUAL SHORTNESS OF BREATH *UNUSUAL BRUISING OR BLEEDING *URINARY PROBLEMS (pain or burning when urinating, or frequent urination) *BOWEL PROBLEMS (unusual diarrhea, constipation, pain near the anus) TENDERNESS IN MOUTH AND THROAT WITH OR WITHOUT PRESENCE OF ULCERS (sore throat, sores in mouth, or a toothache) UNUSUAL RASH, SWELLING OR PAIN  UNUSUAL VAGINAL DISCHARGE OR ITCHING   Items with * indicate a potential emergency and should be followed up as soon as possible or go to the Emergency Department if any problems should occur.  Please show the CHEMOTHERAPY ALERT CARD or IMMUNOTHERAPY ALERT  CARD at check-in to the Emergency Department and triage nurse.  Should you have questions after your visit or need to cancel or reschedule your appointment, please contact [facility]  Dept: [phone]  and follow the prompts.  Office hours are 8:00 a.m. to 4:30 p.m. Monday - Friday. Please note that voicemails left after 4:00 p.m. may not be returned until the following business day.  We are closed weekends and major holidays. You have access to a nurse at all times for urgent questions. Please call the main number to the clinic Dept: [phone] and follow the prompts.   For any non-urgent questions, you may also contact your provider using MyChart. We now offer e-Visits for anyone 18 and older to request care online for non-urgent symptoms. For details visit [email or web address].   Also download the MyChart app! Go to the app store, search "MyChart", open the app, select [address], and log in with your MyChart username and password.   

## 2022-11-10 NOTE — Progress Notes (Signed)
[facility] [phone]   [phone]  OFFICE PROGRESS NOTE  Shon Baton, MD [place] [id number]  DIAGNOSIS: Extensive stage (T3, N2, M1b) small cell lung cancer presented with obstructive left upper lobe lung mass in addition to mediastinal lymphadenopathy and malignant left pleural effusion as well as pleural-based metastasis as well as suspicious liver metastasis diagnosed in June 2023.  PRIOR THERAPY: None  CURRENT THERAPY: Palliative systemic chemotherapy with carboplatin initiated for AUC of 4 on day 1 and etoposide 80 Mg/M2 on days 1, 2 and 3 with Cosela before the chemotherapy and Imfinzi 1500 Mg IV on day 1 every 3 weeks.  Status post 11 cycles.  Starting from cycle #5 the patient will be on maintenance treatment with Imfinzi 1500 Mg IV every 4 weeks.   INTERVAL HISTORY: Pedro Pope. 82 y.o. male returns to the clinic today for follow-up visit.  The patient is feeling fine today with no concerning complaints.  He has been tolerating his maintenance treatment with immunotherapy fairly well.  He denied having any current chest pain, shortness of breath, cough or hemoptysis.  He has no nausea, vomiting, diarrhea or constipation.  He has no headache or visual changes.  He denied having any significant weight loss or night sweats.  He is here today for evaluation before starting cycle #12 of his treatment.  MEDICAL HISTORY: Past Medical History:  Diagnosis Date   Anosmia    CAD (coronary artery disease), native coronary artery    a. 10/29/2013 antlat STEMI s/p DES to pLAD; residual 80 % proximal and 60% mid LCx diease   Chronic headaches    Dressler's syndrome (HCC)    a. placed on colchicine    ED (erectile dysfunction)    Elevated TSH    Fatty liver    Gout    Ischemic cardiomyopathy    a. 2D ECHO 11/04/14 w/ EF 40-45% with LV WMA, G2DD, mild MR, mild LA dilation, mod TR.   PAF (paroxysmal atrial fibrillation) ([place])     a. Dx on 11/03/14 admission. Not placed on longterm AC due to DAPT w/ receent DES. Will plan for outpt heart monitor    Pre-diabetes    a. HgA1c 6.1 in 10/2014   Prostate cancer [facility])    s/p prostatectomy and XRT   Rheumatic fever 1947    ALLERGIES:  is allergic to penicillins, clindamycin, corn syrup [glucose], and aspirin adult low [aspirin].  MEDICATIONS:  Current Outpatient Medications  Medication Sig Dispense Refill   ascorbic acid (VITAMIN C) 500 MG tablet Take 250 mg by mouth 2 (two) times daily.     Blood Pressure Monitor KIT For daily blood pressure monitoring;  Arm Cuff 1 each 0   carvedilol (COREG) 3.125 MG tablet Take 1 tablet (3.125 mg total) by mouth daily. Take 3.125 mg daily     escitalopram (LEXAPRO) 5 MG tablet Take 5 mg by mouth daily.     nystatin (MYCOSTATIN/NYSTOP) powder Apply 1 Application topically 3 (three) times daily. 15 g 0   triamcinolone cream (KENALOG) 0.1 % Apply 1 Application topically 2 (two) times daily. 80 g 0   acetaminophen (TYLENOL) 500 MG tablet Take 500 mg by mouth every 6 (six) hours as needed for mild pain or moderate pain. (Patient not taking: Reported on 11/04/2022)     albuterol (VENTOLIN HFA) 108 (90 Base) MCG/ACT inhaler Inhale 1-2 puffs into the lungs every 6 (six) hours as needed. (Patient not  taking: Reported on 11/04/2022) 8 g 2   Cholecalciferol (VITAMIN D3) 25 MCG (1000 UT) CAPS Take 1 capsule by mouth daily as needed (gout). (Patient not taking: Reported on 11/10/2022)     colchicine 0.6 MG tablet Take 1 tablet twice a day by oral route. (Patient not taking: Reported on 11/10/2022)     hydrOXYzine (ATARAX) 10 MG tablet Take 1 tablet (10 mg total) by mouth 3 (three) times daily as needed. (Patient not taking: Reported on 11/10/2022) 30 tablet 0   oxyCODONE (ROXICODONE) 5 MG immediate release tablet Take 0.5-1 tablets (2.5-5 mg total) by mouth every 4 (four) hours as needed. (Patient not taking: Reported on 11/04/2022) 30 tablet 0    PROCTO-MED HC 2.5 % rectal cream Apply topically. (Patient not taking: Reported on 11/10/2022)     No current facility-administered medications for this visit.    SURGICAL HISTORY:  Past Surgical History:  Procedure Laterality Date   CORONARY ANGIOPLASTY WITH STENT PLACEMENT  10/30/14   STEMI s/p DES to proximal LAD; residual 80 % proximal and 60% mid LCx diease   IR IMAGING GUIDED PORT INSERTION  03/12/2022   IR PATIENT EVAL TECH 0-60 MINS  10/20/2022   IR PATIENT EVAL TECH 0-60 MINS  10/27/2022   IR PATIENT EVAL TECH 0-60 MINS  10/30/2022   IR PATIENT EVAL TECH 0-60 MINS  10/23/2022   IR RADIOLOGIST EVAL & MGMT  10/02/2022   IR RADIOLOGIST EVAL & MGMT  10/13/2022   IR RADIOLOGIST EVAL & MGMT  10/14/2022   IR RADIOLOGIST EVAL & MGMT  10/17/2022   IR RADIOLOGIST EVAL & MGMT  10/20/2022   IR REMOVAL TUN ACCESS W/ PORT W/O FL MOD SED  10/13/2022   LEFT HEART CATHETERIZATION WITH CORONARY ANGIOGRAM N/A 10/30/2014   Procedure: LEFT HEART CATHETERIZATION WITH CORONARY ANGIOGRAM;  Surgeon: Lorretta Harp, MD;  Location: [facility] CATH LAB;  Service: Cardiovascular;  Laterality: N/A;   PERCUTANEOUS CORONARY STENT INTERVENTION (PCI-S)  10/30/2014   Procedure: PERCUTANEOUS CORONARY STENT INTERVENTION (PCI-S);  Surgeon: Lorretta Harp, MD;  Location: [facility] CATH LAB;  Service: Cardiovascular;;   PROSTATECTOMY     THORACENTESIS Left 12/31/2021   Procedure: THORACENTESIS;  Surgeon: Candee Furbish, MD;  Location: [facility] ENDOSCOPY;  Service: Pulmonary;  Laterality: Left;   TONSILLECTOMY     VIDEO ASSISTED THORACOSCOPY (VATS)/DECORTICATION Left 01/21/2022   Procedure: VIDEO ASSISTED THORACOSCOPY (VATS)/DECORTICATION;  Surgeon: Lajuana Matte, MD;  Location: [facility] OR;  Service: Thoracic;  Laterality: Left;    REVIEW OF SYSTEMS:  A comprehensive review of systems was negative except for: Neurological: positive for dizziness   PHYSICAL EXAMINATION: General appearance: alert, cooperative, fatigued, and no distress Head:  Normocephalic, without obvious abnormality, atraumatic Neck: no adenopathy, no JVD, supple, symmetrical, trachea midline, and thyroid not enlarged, symmetric, no tenderness/mass/nodules Lymph nodes: Cervical, supraclavicular, and axillary nodes normal. Resp: clear to auscultation bilaterally Back: symmetric, no curvature. ROM normal. No CVA tenderness. Cardio: regular rate and rhythm, S1, S2 normal, no murmur, click, rub or gallop GI: soft, non-tender; bowel sounds normal; no masses,  no organomegaly Extremities: extremities normal, atraumatic, no cyanosis or edema  ECOG PERFORMANCE STATUS: 1 - Symptomatic but completely ambulatory  Blood pressure (!) 126/52, pulse 60, temperature 97.6 F (36.4 C), temperature source Oral, resp. rate 16, weight 144 lb 9.6 oz (65.6 kg), SpO2 97 %.  LABORATORY DATA: Lab Results  Component Value Date   WBC 9.0 11/10/2022   HGB 12.5 (L) 11/10/2022   HCT 37.9 (L)  11/10/2022   MCV 91.1 11/10/2022   PLT 171 11/10/2022      Chemistry      Component Value Date/Time   NA 143 11/10/2022 1036   K 3.8 11/10/2022 1036   CL 109 11/10/2022 1036   CO2 29 11/10/2022 1036   BUN 20 11/10/2022 1036   CREATININE 0.92 11/10/2022 1036      Component Value Date/Time   CALCIUM 8.8 (L) 11/10/2022 1036   ALKPHOS 64 11/10/2022 1036   AST 18 11/10/2022 1036   ALT 9 11/10/2022 1036   BILITOT 0.4 11/10/2022 1036       RADIOGRAPHIC STUDIES: IR PATIENT EVAL TECH 0-60 MINS  Result Date: 10/27/2022 Wolfgang Phoenix     10/29/2022 11:12 AM Pt presents to IR for continued care of right port removal site wound care. Right side port was removed 10/13/22 due to port infection. He underwent wound site care in IR 10/17/22, 10/20/22 and 10/23/22 with hydrogel. Pocket is closed. Incision has not completely closed but is healing well. There is no redness, oozing, bleeding or warmth. Pt denies fever, chills, pain or tenderness. A thin layer of Hydrogel was spread over the incision site.  Telfa dressing was placed and sealed with Tegaderm. Pt was advised to keep the area clean and dry. He was educated to remove if bandage becomes wet and replace dressing. Pt verbalized understanding of these instructions. He will return in 3 days to evaluate site Seen by Narda Rutherford NP  IR PATIENT EVAL TECH 0-60 MINS  Result Date: 10/23/2022 Wolfgang Phoenix     11/06/2022  9:15 AM gned   Patient was seen today for follow-up wound care following removal of port on 10/13/22 d/t erosion of skin at the port site. The patient reports feeling well on today's visit. He denies pain at the site, nausea, vomiting, fever, and chills. He does endorse some occasional mild itching at the site, but states that he does not scratch it.  On exam today, there is a small open wound in the upper right chest. There is no surrounding erythema, induration, or visible drainage from the wound. Area around the wound is non-tender. Wound was irrigated with sterile saline and hydrogel was placed into the wound. A dressing was placed over the wound. Patient was advised to call if he experiences fever, chills, drainage from the wound, pain at the wound site, or other concern for infection develops. Patient voiced his understanding.  The patient was discharged in good spirits. Lura Em, PA-C 10/23/2022     IR Radiologist Eval & Mgmt  Result Date: 10/23/2022 EXAM: ESTABLISHED PATIENT OFFICE VISIT CHIEF COMPLAINT: See below HISTORY OF PRESENT ILLNESS: See below REVIEW OF SYSTEMS: See below PHYSICAL EXAMINATION: See below ASSESSMENT AND PLAN: Please refer to completed note in the electronic medical record on [place] Mugweru, MD [facility] Electronically Signed   By: Michaelle Birks M.D.   On: 10/23/2022 09:58   IR PATIENT EVAL TECH 0-60 MINS  Result Date: 10/20/2022 Monia Sabal     10/20/2022 11:59 AM The patient was brought into the exam room. The patient then  removed his shirt. I then removed the dressing from over the wound site Pt seen for continued care of port removal site. He was seen by Narda Rutherford, NP. See NP note; Asissted in removal of old bandages, surrounding skin was cleaned. New Hydrogel packing was inserted and a new Tefla dressing with a tegaderm  bandage was applied. Pt is scheduled for another follow-up appointment for the Thursday, 10/23/2022.   IR Radiologist Eval & Mgmt  Result Date: 10/17/2022 EXAM: ESTABLISHED PATIENT OFFICE VISIT CHIEF COMPLAINT: Recent port a cath removal due to skin erosion HISTORY OF PRESENT ILLNESS: 82 yo male with hx small cell lung cancer and right chest wall port placement on 03/12/22. Presented to IR dept on 10/13/22 for port evaluation due to concern for infection. Subsequently underwent port a cath removal due to port erosion through skin with application of hydrgel. Currently on doxycycline. Denies fever, chills, worsening pain, purulent drainage at previous port site. Presents today for port reevaluation and new hydrgel application. REVIEW OF SYSTEMS: See above PHYSICAL EXAMINATION: Rt chest wall port pocket clean and dry, small open pocket noted without worsening erythema or purulent drainage, nontender to palpation ASSESSMENT AND PLAN: 82 yo male with hx small cell lung cancer and right chest wall port placement on 03/12/22. Presented to IR dept on 10/13/22 for port evaluation due to concern for infection. Subsequently underwent port a cath removal due to port erosion through skin with application of hydrgel. Site currently appears stable; overlying skin cleansed with chlorhexidine, pocket irrigated with saline flush and new hydrogel application performed. Telfa/gauze dressing applied. Pt will return to IR dept next week for reevaluation of previous port site. Lindaann Pascal Electronically Signed   By: Ruthann Cancer M.D.   On: 10/17/2022 12:50   IR Radiologist Eval & Mgmt  Result Date: 10/14/2022 EXAM: ESTABLISHED  PATIENT VISIT CHIEF COMPLAINT: See below HISTORY OF PRESENT ILLNESS: See below REVIEW OF SYSTEMS: See below PHYSICAL EXAMINATION: See below ASSESSMENT AND PLAN: Please refer to completed note in the electronic medical record on [place] Mugweru, MD [facility] Radiology Electronically Signed   By: Michaelle Birks M.D.   On: 10/14/2022 12:04   IR REMOVAL TUN ACCESS W/ PORT W/O FL MOD SED  Result Date: 10/13/2022 CLINICAL DATA:  Port erosion. EXAM: REMOVAL OF IMPLANTED TUNNELED PORT-A-CATH MEDICATIONS: None. ANESTHESIA/SEDATION: Local anesthetic was administered. The patient was continuously monitored during the procedure by the interventional radiology nurse under my direct supervision. FLUOROSCOPY TIME:  None PROCEDURE: Informed written consent was obtained from the patient after a discussion of the risk, benefits and alternatives to the procedure. The patient was positioned supine on the fluoroscopy table and the RIGHT chest Port-A-Cath site was prepped with chlorhexidine. A sterile gown and gloves were worn during the procedure. Local anesthesia was provided with 1% lidocaine with epinephrine. A timeout was performed prior to the initiation of the procedure. An incision was made overlying the Port-A-Cath with a #15 scalpel. Utilizing sharp and blunt dissection, the Port-A-Cath was removed completely. The pocked was irrigated with sterile saline. The wound was LEFT open secondary infection, and filled with Hydrogel. Dressings were applied. The patient tolerated the procedure well without immediate post procedural complication. FINDINGS: Successful removal of implant Port-A-Cath without immediate post procedural complication. IMPRESSION: Successful removal of a RIGHT chest implanted Port-A-Cath secondary to erosion. PLAN: The patient will return to Vascular Interventional Radiology (VIR) for routine wound evaluation and Hydrogel application in 3 days. Michaelle Birks, MD [facility] Radiology Electronically Signed   By: Michaelle Birks M.D.   On: 10/13/2022 16:39   IR Radiologist Eval & Mgmt  Result Date: 10/13/2022 EXAM: ESTABLISHED PATIENT VISIT CHIEF COMPLAINT: See below HISTORY OF PRESENT ILLNESS: See below REVIEW OF SYSTEMS: See below PHYSICAL EXAMINATION: See  below ASSESSMENT AND PLAN: Please refer to completed note in the electronic medical record on [place] Mugweru, MD [facility] Radiology Electronically Signed   By: Michaelle Birks M.D.   On: 10/13/2022 15:30    ASSESSMENT AND PLAN: This is a very pleasant 82 years old white male with Extensive stage (T3, N2, M1a) small cell lung cancer presented with obstructive left upper lobe lung mass in addition to mediastinal lymphadenopathy and malignant left pleural effusion as well as pleural-based metastasis and suspicious liver metastasis diagnosed in June 2023. The patient is currently undergoing systemic chemotherapy with carboplatin for AUC of 4 on day 1, etoposide 80 Mg/M2 on days 1, 2 and 3 with Imfinzi 240 Mg/M2 on the days before chemotherapy and Imfinzi 1500 Mg IV on day 1 every 3 weeks.  Status post 11 cycles.  Starting from cycle #5 he will be on maintenance treatment with immunotherapy with Imfinzi 1500 Mg IV every 4 weeks. The patient has been tolerating this treatment well with no concerning adverse effects. He was seen at [facility] for consideration of clinical trial but the patient is not eligible for the trial because he has no disease progression. I recommended for him to proceed with cycle #12 today as planned. I will see him back for follow-up visit in 4 weeks for evaluation with repeat CT scan of the chest, abdomen and pelvis as well as MRI of the brain because of the recent dizzy spells. The patient was advised to call immediately if he has any other  concerning symptoms in the interval. The patient voices understanding of current disease status and treatment options and is in agreement with the current care plan.  All questions were answered. The patient knows to call the clinic with any problems, questions or concerns. We can certainly see the patient much sooner if necessary.  The total time spent in the appointment was 20 minutes.  Disclaimer: This note was dictated with voice recognition software. Similar sounding words can inadvertently be transcribed and may not be corrected upon review.

## 2022-11-10 NOTE — Progress Notes (Signed)
Patient seen by MD today  Vitals are within treatment parameters.  Labs reviewed: and are within treatment parameters.  Per physician team, patient is ready for treatment and there are NO modifications to the treatment plan.  

## 2022-11-13 ENCOUNTER — Other Ambulatory Visit (HOSPITAL_COMMUNITY): Payer: Self-pay | Admitting: Interventional Radiology

## 2022-11-13 ENCOUNTER — Ambulatory Visit (HOSPITAL_COMMUNITY)
Admission: RE | Admit: 2022-11-13 | Discharge: 2022-11-13 | Disposition: A | Discharge status: Home / Self Care | Payer: Medicare Other | Source: Ambulatory Visit | Attending: Student | Admitting: Student

## 2022-11-13 DIAGNOSIS — C3490 Malignant neoplasm of unspecified part of unspecified bronchus or lung: Secondary | ICD-10-CM

## 2022-11-13 HISTORY — PX: IR PATIENT EVAL TECH 0-60 MINS: IMG5564

## 2022-11-13 NOTE — Progress Notes (Signed)
Pedro Pope presented in IR for a port site check.  Port site looked looked, clean, dry and intact.  Port pocket was flushed and a thin layer of hydrogel was placed at incision site, a gauze dressing was put over site.  It is recommended for the patient to return in 2 weeks for site follow up.

## 2022-11-14 ENCOUNTER — Encounter (HOSPITAL_COMMUNITY): Payer: Self-pay | Admitting: Radiology

## 2022-11-14 NOTE — Procedures (Signed)
See Progress note from today.

## 2022-11-14 NOTE — Procedures (Signed)
Mr. Narum presented in IR for a port site check.  Port site looked looked, clean, dry and intact.  Port pocket was flushed and a thin layer of hydrogel was placed at incision site, a gauze dressing was put over site.  It is recommended for the patient to return in 1 weeks for site follow up.            Seen by: Mare Loan RT

## 2022-11-18 ENCOUNTER — Other Ambulatory Visit: Payer: Self-pay

## 2022-11-18 ENCOUNTER — Telehealth: Payer: Self-pay | Admitting: Cardiovascular Disease

## 2022-11-18 NOTE — Telephone Encounter (Signed)
*  STAT* If patient is at the pharmacy, call can be transferred to refill team.   1. Which medications need to be refilled? (please list name of each medication and dose if known) new prescription for Carvedilol- new pharmacy  2. Which pharmacy/location (including street and city if local pharmacy) is medication to be sent to? [facility] [address] , [place]  3. Do they need a 30 day or 90 day supply? 90 and refills

## 2022-11-18 NOTE — Telephone Encounter (Signed)
Follow Up:     Please call this in today, if possible. He is out of it

## 2022-11-20 ENCOUNTER — Other Ambulatory Visit: Payer: Self-pay

## 2022-11-20 DIAGNOSIS — I255 Ischemic cardiomyopathy: Secondary | ICD-10-CM | POA: Diagnosis not present

## 2022-11-20 DIAGNOSIS — G4734 Idiopathic sleep related nonobstructive alveolar hypoventilation: Secondary | ICD-10-CM | POA: Diagnosis not present

## 2022-11-20 DIAGNOSIS — C3482 Malignant neoplasm of overlapping sites of left bronchus and lung: Secondary | ICD-10-CM | POA: Diagnosis not present

## 2022-11-20 DIAGNOSIS — I700 Atherosclerosis of aorta: Secondary | ICD-10-CM | POA: Diagnosis not present

## 2022-11-20 DIAGNOSIS — I2510 Atherosclerotic heart disease of native coronary artery without angina pectoris: Secondary | ICD-10-CM | POA: Diagnosis not present

## 2022-11-20 DIAGNOSIS — E781 Pure hyperglyceridemia: Secondary | ICD-10-CM | POA: Diagnosis not present

## 2022-11-20 DIAGNOSIS — R030 Elevated blood-pressure reading, without diagnosis of hypertension: Secondary | ICD-10-CM | POA: Diagnosis not present

## 2022-11-20 DIAGNOSIS — I272 Pulmonary hypertension, unspecified: Secondary | ICD-10-CM | POA: Diagnosis not present

## 2022-11-20 DIAGNOSIS — C61 Malignant neoplasm of prostate: Secondary | ICD-10-CM | POA: Diagnosis not present

## 2022-11-20 DIAGNOSIS — J84112 Idiopathic pulmonary fibrosis: Secondary | ICD-10-CM | POA: Diagnosis not present

## 2022-11-20 DIAGNOSIS — D6869 Other thrombophilia: Secondary | ICD-10-CM | POA: Diagnosis not present

## 2022-11-20 DIAGNOSIS — J439 Emphysema, unspecified: Secondary | ICD-10-CM | POA: Diagnosis not present

## 2022-11-20 DIAGNOSIS — Z1339 Encounter for screening examination for other mental health and behavioral disorders: Secondary | ICD-10-CM | POA: Diagnosis not present

## 2022-11-20 DIAGNOSIS — Z0000 Encounter for general adult medical examination without abnormal findings: Secondary | ICD-10-CM | POA: Diagnosis not present

## 2022-11-20 DIAGNOSIS — I4891 Unspecified atrial fibrillation: Secondary | ICD-10-CM | POA: Diagnosis not present

## 2022-11-20 DIAGNOSIS — C3490 Malignant neoplasm of unspecified part of unspecified bronchus or lung: Secondary | ICD-10-CM | POA: Diagnosis not present

## 2022-11-20 DIAGNOSIS — Z1331 Encounter for screening for depression: Secondary | ICD-10-CM | POA: Diagnosis not present

## 2022-11-20 DIAGNOSIS — Z23 Encounter for immunization: Secondary | ICD-10-CM | POA: Diagnosis not present

## 2022-11-20 MED ORDER — CARVEDILOL 3.125 MG PO TABS: 3.1250 mg | ORAL_TABLET | Freq: Every day | ORAL | 3 refills | Status: DC

## 2022-11-21 ENCOUNTER — Other Ambulatory Visit: Payer: Self-pay

## 2022-11-21 MED ORDER — CARVEDILOL 3.125 MG PO TABS: 3.1250 mg | ORAL_TABLET | Freq: Every day | ORAL | 1 refills | Status: DC

## 2022-11-21 NOTE — Telephone Encounter (Signed)
Pt's medication was sent to pt's pharmacy as requested. Confirmation received.  °

## 2022-11-21 NOTE — Telephone Encounter (Signed)
  PleaseLase resend this  prescription.      *STAT* If patient is at the pharmacy, call can be transferred to refill team.   1. Which medications need to be refilled? (please list name of each medication and dose if known) Carvedilol- need it for 1 time a day  2. Which pharmacy/location (including street and city if local pharmacy) is medication to be sent to? [facility] [address], [place]  3. Do they need a 30 day or 90 day supply? #90 and refills- Please call today, patient is completely out

## 2022-11-26 NOTE — Telephone Encounter (Signed)
ATC patient's son Thayer Ohm for update on this as Dr. Marchelle Gearing requested Esbriet BIV be restarted after last OV on 11/04/22. I did talk to Dr. Marchelle Gearing after that visit and he said he was not aware of any oncology note to hold on starting. Left VM for Thayer Ohm with my direct number. I spoke with patient thereafter. He states that he will follow-up with his oncologist at next appt to make sure he is okay to start Esbriet.  I will route message to Dr. Arbutus Ped for clarification  Chesley Mires, PharmD, MPH, BCPS, CPP Clinical Pharmacist (Rheumatology and Pulmonology)

## 2022-12-01 ENCOUNTER — Encounter (HOSPITAL_COMMUNITY): Payer: Self-pay

## 2022-12-01 ENCOUNTER — Ambulatory Visit (HOSPITAL_COMMUNITY)
Admission: RE | Admit: 2022-12-01 | Discharge: 2022-12-01 | Disposition: A | Discharge status: Home / Self Care | Payer: Medicare Other | Source: Ambulatory Visit | Attending: Student | Admitting: Student

## 2022-12-01 DIAGNOSIS — Z4800 Encounter for change or removal of nonsurgical wound dressing: Secondary | ICD-10-CM | POA: Diagnosis not present

## 2022-12-01 DIAGNOSIS — C3490 Malignant neoplasm of unspecified part of unspecified bronchus or lung: Secondary | ICD-10-CM | POA: Insufficient documentation

## 2022-12-01 HISTORY — PX: IR PATIENT EVAL TECH 0-60 MINS: IMG5564

## 2022-12-01 NOTE — Procedures (Signed)
Patient was seen in IR on 12/01/2022 for an evaluation of port removal site.  Small amount of hydrogel was applied to site and new dressing was placed over site.  Patient was advised to call IR with any issues regarding port removal site.

## 2022-12-03 ENCOUNTER — Ambulatory Visit (HOSPITAL_COMMUNITY)
Admission: RE | Admit: 2022-12-03 | Discharge: 2022-12-03 | Disposition: A | Discharge status: Home / Self Care | Payer: Medicare Other | Source: Ambulatory Visit | Attending: Internal Medicine | Admitting: Internal Medicine

## 2022-12-03 DIAGNOSIS — K3189 Other diseases of stomach and duodenum: Secondary | ICD-10-CM | POA: Diagnosis not present

## 2022-12-03 DIAGNOSIS — J439 Emphysema, unspecified: Secondary | ICD-10-CM | POA: Diagnosis not present

## 2022-12-03 DIAGNOSIS — C3490 Malignant neoplasm of unspecified part of unspecified bronchus or lung: Secondary | ICD-10-CM | POA: Insufficient documentation

## 2022-12-03 DIAGNOSIS — N200 Calculus of kidney: Secondary | ICD-10-CM | POA: Diagnosis not present

## 2022-12-03 DIAGNOSIS — J479 Bronchiectasis, uncomplicated: Secondary | ICD-10-CM | POA: Diagnosis not present

## 2022-12-03 MED ORDER — SODIUM CHLORIDE (PF) 0.9 % IJ SOLN
INTRAMUSCULAR | Status: AC
  Filled 2022-12-03: qty 50

## 2022-12-03 MED ORDER — IOHEXOL 300 MG/ML  SOLN
100.0000 mL | Freq: Once | INTRAMUSCULAR | Status: AC | PRN
  Administered 2022-12-03: 100 mL via INTRAVENOUS

## 2022-12-03 MED ORDER — GADOBUTROL 1 MMOL/ML IV SOLN
6.0000 mL | Freq: Once | INTRAVENOUS | Status: AC | PRN
  Administered 2022-12-03: 6 mL via INTRAVENOUS

## 2022-12-08 ENCOUNTER — Encounter: Payer: Self-pay | Admitting: Internal Medicine

## 2022-12-08 ENCOUNTER — Inpatient Hospital Stay (HOSPITAL_BASED_OUTPATIENT_CLINIC_OR_DEPARTMENT_OTHER): Payer: Medicare Other | Admitting: Internal Medicine

## 2022-12-08 ENCOUNTER — Inpatient Hospital Stay: Payer: Medicare Other

## 2022-12-08 ENCOUNTER — Encounter: Payer: Self-pay | Admitting: Medical Oncology

## 2022-12-08 ENCOUNTER — Inpatient Hospital Stay: Payer: Medicare Other | Attending: Internal Medicine

## 2022-12-08 VITALS — BP 113/47 | HR 52

## 2022-12-08 DIAGNOSIS — C782 Secondary malignant neoplasm of pleura: Secondary | ICD-10-CM | POA: Diagnosis not present

## 2022-12-08 DIAGNOSIS — C3482 Malignant neoplasm of overlapping sites of left bronchus and lung: Secondary | ICD-10-CM

## 2022-12-08 DIAGNOSIS — J910 Malignant pleural effusion: Secondary | ICD-10-CM | POA: Diagnosis not present

## 2022-12-08 DIAGNOSIS — Z5112 Encounter for antineoplastic immunotherapy: Secondary | ICD-10-CM | POA: Diagnosis not present

## 2022-12-08 DIAGNOSIS — C787 Secondary malignant neoplasm of liver and intrahepatic bile duct: Secondary | ICD-10-CM | POA: Insufficient documentation

## 2022-12-08 LAB — CMP (CANCER CENTER ONLY)
ALT: 8 U/L (ref 0–44)
AST: 16 U/L (ref 15–41)
Albumin: 3.7 g/dL (ref 3.5–5.0)
Alkaline Phosphatase: 71 U/L (ref 38–126)
Anion gap: 8 (ref 5–15)
BUN: 17 mg/dL (ref 8–23)
CO2: 27 mmol/L (ref 22–32)
Calcium: 9.2 mg/dL (ref 8.9–10.3)
Chloride: 107 mmol/L (ref 98–111)
Creatinine: 1 mg/dL (ref 0.61–1.24)
GFR, Estimated: >60 mL/min (ref >60)
Glucose, Bld: 136 mg/dL — ABNORMAL HIGH (ref 70–99)
Potassium: 3.8 mmol/L (ref 3.5–5.1)
Sodium: 142 mmol/L (ref 135–145)
Total Bilirubin: 0.4 mg/dL (ref 0.3–1.2)
Total Protein: 6.3 g/dL — ABNORMAL LOW (ref 6.5–8.1)

## 2022-12-08 LAB — CBC WITH DIFFERENTIAL (CANCER CENTER ONLY)
Abs Immature Granulocytes: 0.02 10*3/uL (ref 0.00–0.07)
Basophils Absolute: 0 10*3/uL (ref 0.0–0.1)
Basophils Relative: 1 %
Eosinophils Absolute: 0.3 10*3/uL (ref 0.0–0.5)
Eosinophils Relative: 4 %
HCT: 40.1 % (ref 39.0–52.0)
Hemoglobin: 12.8 g/dL — ABNORMAL LOW (ref 13.0–17.0)
Immature Granulocytes: 0 %
Lymphocytes Relative: 26 %
Lymphs Abs: 1.7 10*3/uL (ref 0.7–4.0)
MCH: 29.6 pg (ref 26.0–34.0)
MCHC: 31.9 g/dL (ref 30.0–36.0)
MCV: 92.8 fL (ref 80.0–100.0)
Monocytes Absolute: 0.5 10*3/uL (ref 0.1–1.0)
Monocytes Relative: 8 %
Neutro Abs: 4.1 10*3/uL (ref 1.7–7.7)
Neutrophils Relative %: 61 %
Platelet Count: 162 10*3/uL (ref 150–400)
RBC: 4.32 MIL/uL (ref 4.22–5.81)
RDW: 14 % (ref 11.5–15.5)
WBC Count: 6.7 10*3/uL (ref 4.0–10.5)
nRBC: 0 % (ref 0.0–0.2)

## 2022-12-08 MED ORDER — SODIUM CHLORIDE 0.9 % IV SOLN: Freq: Once | INTRAVENOUS | Status: AC

## 2022-12-08 MED ORDER — SODIUM CHLORIDE 0.9 % IV SOLN
1500.0000 mg | Freq: Once | INTRAVENOUS | Status: AC
  Administered 2022-12-08: 1500 mg via INTRAVENOUS
  Filled 2022-12-08: qty 30

## 2022-12-08 NOTE — Patient Instructions (Signed)
[facility]  Discharge Instructions: Thank you for choosing [facility] to provide your oncology and hematology care.   If you have a lab appointment with the Cancer Center, please go directly to the Cancer Center and check in at the registration area.   Wear comfortable clothing and clothing appropriate for easy access to any Portacath or PICC line.   We strive to give you quality time with your provider. You may need to reschedule your appointment if you arrive late (15 or more minutes).  Arriving late affects you and other patients whose appointments are after yours.  Also, if you miss three or more appointments without notifying the office, you may be dismissed from the clinic at the provider's discretion.      For prescription refill requests, have your pharmacy contact our office and allow 72 hours for refills to be completed.    Today you received the following chemotherapy and/or immunotherapy agents: Imfinzi      To help prevent nausea and vomiting after your treatment, we encourage you to take your nausea medication as directed.  BELOW ARE SYMPTOMS THAT SHOULD BE REPORTED IMMEDIATELY: *FEVER GREATER THAN 100.4 F (38 C) OR HIGHER *CHILLS OR SWEATING *NAUSEA AND VOMITING THAT IS NOT CONTROLLED WITH YOUR NAUSEA MEDICATION *UNUSUAL SHORTNESS OF BREATH *UNUSUAL BRUISING OR BLEEDING *URINARY PROBLEMS (pain or burning when urinating, or frequent urination) *BOWEL PROBLEMS (unusual diarrhea, constipation, pain near the anus) TENDERNESS IN MOUTH AND THROAT WITH OR WITHOUT PRESENCE OF ULCERS (sore throat, sores in mouth, or a toothache) UNUSUAL RASH, SWELLING OR PAIN  UNUSUAL VAGINAL DISCHARGE OR ITCHING   Items with * indicate a potential emergency and should be followed up as soon as possible or go to the Emergency Department if any problems should occur.  Please show the CHEMOTHERAPY ALERT CARD or IMMUNOTHERAPY ALERT CARD at  check-in to the Emergency Department and triage nurse.  Should you have questions after your visit or need to cancel or reschedule your appointment, please contact [facility]  Dept: [phone]  and follow the prompts.  Office hours are 8:00 a.m. to 4:30 p.m. Monday - Friday. Please note that voicemails left after 4:00 p.m. may not be returned until the following business day.  We are closed weekends and major holidays. You have access to a nurse at all times for urgent questions. Please call the main number to the clinic Dept: [phone] and follow the prompts.   For any non-urgent questions, you may also contact your provider using MyChart. We now offer e-Visits for anyone 18 and older to request care online for non-urgent symptoms. For details visit [email or web address].   Also download the MyChart app! Go to the app store, search "MyChart", open the app, select [facility], and log in with your MyChart username and password.   

## 2022-12-08 NOTE — Progress Notes (Signed)
Patient seen by Dr. Mohamed  Vitals are within treatment parameters.  Labs reviewed: and are within treatment parameters.  Per physician team, patient is ready for treatment and there are NO modifications to the treatment plan.  

## 2022-12-08 NOTE — Progress Notes (Signed)
[facility] [phone]   [phone]  OFFICE PROGRESS NOTE  [name], MD [address] [place] [address]  DIAGNOSIS: Extensive stage (T3, N2, M1b) small cell lung cancer presented with obstructive left upper lobe lung mass in addition to mediastinal lymphadenopathy and malignant left pleural effusion as well as pleural-based metastasis as well as suspicious liver metastasis diagnosed in June 2023.  PRIOR THERAPY: None  CURRENT THERAPY: Palliative systemic chemotherapy with carboplatin initiated for AUC of 4 on day 1 and etoposide 80 Mg/M2 on days 1, 2 and 3 with Cosela before the chemotherapy and Imfinzi 1500 Mg IV on day 1 every 3 weeks.  Status post 12 cycles.  Starting from cycle #5 the patient will be on maintenance treatment with Imfinzi 1500 Mg IV every 4 weeks.   INTERVAL HISTORY: Pedro Pope. 82 y.o. male is to the clinic today for follow-up visit accompanied by his wife.  The patient is feeling fine today with no concerning complaints.  He denied having any chest pain, shortness of breath, cough or hemoptysis.  He has no nausea, vomiting, diarrhea or constipation.  He has no headache or visual changes.  He has been tolerating his treatment with immunotherapy fairly well.  The patient had repeat CT scan of the chest, abdomen pelvis as well as MRI of the brain performed recently and he is here for evaluation and discussion of his imaging studies.  MEDICAL HISTORY: Past Medical History:  Diagnosis Date   Anosmia    CAD (coronary artery disease), native coronary artery    a. 10/29/2013 antlat STEMI s/p DES to pLAD; residual 80 % proximal and 60% mid LCx diease   Chronic headaches    Dressler's syndrome (HCC)    a. placed on colchicine    ED (erectile dysfunction)    Elevated TSH    Fatty liver    Gout    Ischemic cardiomyopathy    a. 2D ECHO 11/04/14 w/ EF 40-45% with LV WMA, G2DD, mild MR, mild LA dilation, mod TR.   PAF  (paroxysmal atrial fibrillation) (HCC)    a. Dx on 11/03/14 admission. Not placed on longterm AC due to DAPT w/ receent DES. Will plan for outpt heart monitor    Pre-diabetes    a. HgA1c 6.1 in 10/2014   Prostate cancer [facility])    s/p prostatectomy and XRT   Rheumatic fever 1947    ALLERGIES:  is allergic to penicillins, clindamycin, corn syrup [glucose], and aspirin adult low [aspirin].  MEDICATIONS:  Current Outpatient Medications  Medication Sig Dispense Refill   ascorbic acid (VITAMIN C) 500 MG tablet Take 250 mg by mouth 2 (two) times daily.     Blood Pressure Monitor KIT For daily blood pressure monitoring;  Arm Cuff 1 each 0   carvedilol (COREG) 3.125 MG tablet Take 1 tablet (3.125 mg total) by mouth daily. Take 3.125 mg daily     escitalopram (LEXAPRO) 5 MG tablet Take 5 mg by mouth daily.     nystatin (MYCOSTATIN/NYSTOP) powder Apply 1 Application topically 3 (three) times daily. 15 g 0   triamcinolone cream (KENALOG) 0.1 % Apply 1 Application topically 2 (two) times daily. 80 g 0   acetaminophen (TYLENOL) 500 MG tablet Take 500 mg by mouth every 6 (six) hours as needed for mild pain or moderate pain. (Patient not taking: Reported on 11/04/2022)     albuterol (VENTOLIN HFA) 108 (90 Base) MCG/ACT inhaler Inhale 1-2 puffs into  the lungs every 6 (six) hours as needed. (Patient not taking: Reported on 11/04/2022) 8 g 2   Cholecalciferol (VITAMIN D3) 25 MCG (1000 UT) CAPS Take 1 capsule by mouth daily as needed (gout). (Patient not taking: Reported on 11/10/2022)     colchicine 0.6 MG tablet Take 1 tablet twice a day by oral route. (Patient not taking: Reported on 11/10/2022)     hydrOXYzine (ATARAX) 10 MG tablet Take 1 tablet (10 mg total) by mouth 3 (three) times daily as needed. (Patient not taking: Reported on 11/10/2022) 30 tablet 0   oxyCODONE (ROXICODONE) 5 MG immediate release tablet Take 0.5-1 tablets (2.5-5 mg total) by mouth every 4 (four) hours as needed. (Patient not taking:  Reported on 11/04/2022) 30 tablet 0   PROCTO-MED HC 2.5 % rectal cream Apply topically. (Patient not taking: Reported on 11/10/2022)     No current facility-administered medications for this visit.    SURGICAL HISTORY:  Past Surgical History:  Procedure Laterality Date   CORONARY ANGIOPLASTY WITH STENT PLACEMENT  10/30/14   STEMI s/p DES to proximal LAD; residual 80 % proximal and 60% mid LCx diease   IR IMAGING GUIDED PORT INSERTION  03/12/2022   IR PATIENT EVAL TECH 0-60 MINS  10/20/2022   IR PATIENT EVAL TECH 0-60 MINS  10/27/2022   IR PATIENT EVAL TECH 0-60 MINS  10/30/2022   IR PATIENT EVAL TECH 0-60 MINS  10/23/2022   IR RADIOLOGIST EVAL & MGMT  10/02/2022   IR RADIOLOGIST EVAL & MGMT  10/13/2022   IR RADIOLOGIST EVAL & MGMT  10/14/2022   IR RADIOLOGIST EVAL & MGMT  10/17/2022   IR RADIOLOGIST EVAL & MGMT  10/20/2022   IR REMOVAL TUN ACCESS W/ PORT W/O FL MOD SED  10/13/2022   LEFT HEART CATHETERIZATION WITH CORONARY ANGIOGRAM N/A 10/30/2014   Procedure: LEFT HEART CATHETERIZATION WITH CORONARY ANGIOGRAM;  Surgeon: Runell Gess, MD;  Location[facility] C[facility] LAB;  Service: Cardiovascular;  Laterality: N/A;   PERCUTANEOUS CORONARY STENT INTERVENTION (PCI-S)  10/30/2014   Procedure: PERCUTANEOUS CORONARY STENT INTERVENTION (PCI-S);  Surgeon: Runell Gess, MD;  Location: [facility] CATH LAB;  Service: Cardiovascular;;   PROSTATECTOMY     THORACENTESIS Left 12/31/2021   Procedure: THORACENTESIS;  Surgeon: Lorin Glass, MD;  Location: [facility] ENDOSCOPY;  Service: Pulmonary;  Laterality: Left;   TONSILLECTOMY     VIDEO ASSISTED THORACOSCOPY (VATS)/DECORTICATION Left 01/21/2022   Procedure: VIDEO ASSISTED THORACOSCOPY (VATS)/DECORTICATION;  Surgeon: Corliss Skains, MD;  Location: [facility] OR;  Service: Thoracic;  Laterality: Left;    REVIEW OF SYSTEMS:  Constitutional: negative Eyes: negative Ears, nose, mouth, throat, and face: negative Respiratory: negative Cardiovascular: negative Gastrointestinal:  negative Genitourinary:negative Integument/breast: negative Hematologic/lymphatic: negative Musculoskeletal:negative Neurological: negative Behavioral/Psych: negative Endocrine: negative Allergic/Immunologic: negative   PHYSICAL EXAMINATION: General appearance: alert, cooperative, and no distress Head: Normocephalic, without obvious abnormality, atraumatic Neck: no adenopathy, no JVD, supple, symmetrical, trachea midline, and thyroid not enlarged, symmetric, no tenderness/mass/nodules Lymph nodes: Cervical, supraclavicular, and axillary nodes normal. Resp: clear to auscultation bilaterally Back: symmetric, no curvature. ROM normal. No CVA tenderness. Cardio: regular rate and rhythm, S1, S2 normal, no murmur, click, rub or gallop GI: soft, non-tender; bowel sounds normal; no masses,  no organomegaly Extremities: extremities normal, atraumatic, no cyanosis or edema Neurologic: Alert and oriented X 3, normal strength and tone. Normal symmetric reflexes. Normal coordination and gait  ECOG PERFORMANCE STATUS: 1 - Symptomatic but completely ambulatory  Blood pressure (!) 126/52, pulse 60, temperature 97.6  F (36.4 C), temperature source Oral, resp. rate 16, weight 144 lb 9.6 oz (65.6 kg), SpO2 97 %.  LABORATORY DATA: Lab Results  Component Value Date   WBC 9.0 11/10/2022   HGB 12.5 (L) 11/10/2022   HCT 37.9 (L) 11/10/2022   MCV 91.1 11/10/2022   PLT 171 11/10/2022      Chemistry      Component Value Date/Time   NA 143 11/10/2022 1036   K 3.8 11/10/2022 1036   CL 109 11/10/2022 1036   CO2 29 11/10/2022 1036   BUN 20 11/10/2022 1036   CREATININE 0.92 11/10/2022 1036      Component Value Date/Time   CALCIUM 8.8 (L) 11/10/2022 1036   ALKPHOS 64 11/10/2022 1036   AST 18 11/10/2022 1036   ALT 9 11/10/2022 1036   BILITOT 0.4 11/10/2022 1036       RADIOGRAPHIC STUDIES: IR PATIENT EVAL TECH 0-60 MINS  Result Date: 10/27/2022 Arby Barrette     10/29/2022 11:12 AM Pt  presents to IR for continued care of right port removal site wound care. Right side port was removed 10/13/22 due to port infection. He underwent wound site care in IR 10/17/22, 10/20/22 and 10/23/22 with hydrogel. Pocket is closed. Incision has not completely closed but is healing well. There is no redness, oozing, bleeding or warmth. Pt denies fever, chills, pain or tenderness. A thin layer of Hydrogel was spread over the incision site. Telfa dressing was placed and sealed with Tegaderm. Pt was advised to keep the area clean and dry. He was educated to remove if bandage becomes wet and replace dressing. Pt verbalized understanding of these instructions. He will return in 3 days to evaluate site Seen by Alex Gardener NP  IR PATIENT EVAL TECH 0-60 MINS  Result Date: 10/23/2022 Arby Barrette     11/06/2022  9:15 AM gned   Patient was seen today for follow-up wound care following removal of port on 10/13/22 d/t erosion of skin at the port site. The patient reports feeling well on today's visit. He denies pain at the site, nausea, vomiting, fever, and chills. He does endorse some occasional mild itching at the site, but states that he does not scratch it.  On exam today, there is a small open wound in the upper right chest. There is no surrounding erythema, induration, or visible drainage from the wound. Area around the wound is non-tender. Wound was irrigated with sterile saline and hydrogel was placed into the wound. A dressing was placed over the wound. Patient was advised to call if he experiences fever, chills, drainage from the wound, pain at the wound site, or other concern for infection develops. Patient voiced his understanding.  The patient was discharged in good spirits. Kennieth Francois, PA-C 10/23/2022     IR Radiologist Eval & Mgmt  Result Date: 10/23/2022 EXAM: ESTABLISHED PATIENT OFFICE VISIT CHIEF COMPLAINT: See below HISTORY OF PRESENT ILLNESS: See below REVIEW OF SYSTEMS: See below PHYSICAL  EXAMINATION: See below ASSESSMENT AND PLAN: Please refer to completed note in the electronic medical record on [place] [facility] Roanna Banning, MD [facility] Radiology Electronically Signed   By: Roanna Banning M.D.   On: 10/23/2022 09:58   IR PATIENT EVAL TECH 0-60 MINS  Result Date: 10/20/2022 Melburn Popper     10/20/2022 11:59 AM The patient was brought into the exam room. The patient then removed his shirt. I then removed the dressing from over the  wound site Pt seen for continued care of port removal site. He was seen by Alex Gardener, NP. See NP note; Asissted in removal of old bandages, surrounding skin was cleaned. New Hydrogel packing was inserted and a new Tefla dressing with a tegaderm bandage was applied. Pt is scheduled for another follow-up appointment for the Thursday, 10/23/2022.   IR Radiologist Eval & Mgmt  Result Date: 10/17/2022 EXAM: ESTABLISHED PATIENT OFFICE VISIT CHIEF COMPLAINT: Recent port a cath removal due to skin erosion HISTORY OF PRESENT ILLNESS: 82 yo male with hx small cell lung cancer and right chest wall port placement on 03/12/22. Presented to IR dept on 10/13/22 for port evaluation due to concern for infection. Subsequently underwent port a cath removal due to port erosion through skin with application of hydrgel. Currently on doxycycline. Denies fever, chills, worsening pain, purulent drainage at previous port site. Presents today for port reevaluation and new hydrgel application. REVIEW OF SYSTEMS: See above PHYSICAL EXAMINATION: Rt chest wall port pocket clean and dry, small open pocket noted without worsening erythema or purulent drainage, nontender to palpation ASSESSMENT AND PLAN: 82 yo male with hx small cell lung cancer and right chest wall port placement on 03/12/22. Presented to IR dept on 10/13/22 for port evaluation due to concern for infection. Subsequently underwent port a cath removal due to port erosion through skin  with application of hydrgel. Site currently appears stable; overlying skin cleansed with chlorhexidine, pocket irrigated with saline flush and new hydrogel application performed. Telfa/gauze dressing applied. Pt will return to IR dept next week for reevaluation of previous port site. Artemio Aly Electronically Signed   By: Marliss Coots M.D.   On: 10/17/2022 12:50   IR Radiologist Eval & Mgmt  Result Date: 10/14/2022 EXAM: ESTABLISHED PATIENT VISIT CHIEF COMPLAINT: See below HISTORY OF PRESENT ILLNESS: See below REVIEW OF SYSTEMS: See below PHYSICAL EXAMINATION: See below ASSESSMENT AND PLAN: Please refer to completed note in the electronic medical record on [place] [employer] Roanna Banning, MD [facility] Radiology Electronically Signed   By: Roanna Banning M.D.   On: 10/14/2022 12:04   IR REMOVAL TUN ACCESS W/ PORT W/O FL MOD SED  Result Date: 10/13/2022 CLINICAL DATA:  Port erosion. EXAM: REMOVAL OF IMPLANTED TUNNELED PORT-A-CATH MEDICATIONS: None. ANESTHESIA/SEDATION: Local anesthetic was administered. The patient was continuously monitored during the procedure by the interventional radiology nurse under my direct supervision. FLUOROSCOPY TIME:  None PROCEDURE: Informed written consent was obtained from the patient after a discussion of the risk, benefits and alternatives to the procedure. The patient was positioned supine on the fluoroscopy table and the RIGHT chest Port-A-Cath site was prepped with chlorhexidine. A sterile gown and gloves were worn during the procedure. Local anesthesia was provided with 1% lidocaine with epinephrine. A timeout was performed prior to the initiation of the procedure. An incision was made overlying the Port-A-Cath with a #15 scalpel. Utilizing sharp and blunt dissection, the Port-A-Cath was removed completely. The pocked was irrigated with sterile saline. The wound was LEFT open secondary infection, and filled with  Hydrogel. Dressings were applied. The patient tolerated the procedure well without immediate post procedural complication. FINDINGS: Successful removal of implant Port-A-Cath without immediate post procedural complication. IMPRESSION: Successful removal of a RIGHT chest implanted Port-A-Cath secondary to erosion. PLAN: The patient will return to Vascular Interventional Radiology (VIR) for routine wound evaluation and Hydrogel application in 3 days. Roanna Banning, MD [facility] Radiology Electronically Signed  By: Jon  Mugweru M.D.   On: 10/13/2022 16:39 Roanna Banningologist Eval & Mgmt  Result Date: 10/13/2022 EXAM: ESTABLISHED PATIENT VISIT CHIEF COMPLAINT: See below HISTORY OF PRESENT ILLNESS: See below REVIEW OF SYSTEMS: See below PHYSICAL EXAMINATION: See below ASSESSMENT AND PLAN: Please refer to completed note in the electronic medical record on [place] [facility] Roanna Banning, MD [facility] Radiology Electronically Signed   By: Roanna Banning M.D.   On: 10/13/2022 15:30    ASSESSMENT AND PLAN: This is a very pleasant 82 years old white male with Extensive stage (T3, N2, M1a) small cell lung cancer presented with obstructive left upper lobe lung mass in addition to mediastinal lymphadenopathy and malignant left pleural effusion as well as pleural-based metastasis and suspicious liver metastasis diagnosed in June 2023. The patient is currently undergoing systemic chemotherapy with carboplatin for AUC of 4 on day 1, etoposide 80 Mg/M2 on days 1, 2 and 3 with Imfinzi 240 Mg/M2 on the days before chemotherapy and Imfinzi 1500 Mg IV on day 1 every 3 weeks.  Status post 12 cycles.  Starting from cycle #5 he will be on maintenance treatment with immunotherapy with Imfinzi 1500 Mg IV every 4 weeks. The patient has been tolerating this treatment well with no concerning adverse effects. He had repeat CT scan of the chest,  abdomen pelvis as well as MRI of the brain performed recently.  I personally and independently reviewed the scans and discussed the result with the patient and his wife. His scan showed no concerning findings for disease progression. I recommended for him to continue his current treatment with maintenance immunotherapy and he will proceed with cycle #13 today. He was seen at [facility] for consideration of clinical trial but the patient is not eligible for the trial because he has no disease progression. I will see him back for follow-up visit in 4 weeks for evaluation before the next cycle of his treatment. The patient was advised to call immediately if he has any other concerning symptoms in the interval. The patient voices understanding of current disease status and treatment options and is in agreement with the current care plan.  All questions were answered. The patient knows to call the clinic with any problems, questions or concerns. We can certainly see the patient much sooner if necessary.  The total time spent in the appointment was 30 minutes.  Disclaimer: This note was dictated with voice recognition software. Similar sounding words can inadvertently be transcribed and may not be corrected upon review.

## 2022-12-11 ENCOUNTER — Telehealth: Payer: Self-pay | Admitting: Internal Medicine

## 2022-12-11 NOTE — Telephone Encounter (Signed)
Called patient regarding upcoming May/June appointments, left a voicemail.  

## 2023-01-05 ENCOUNTER — Inpatient Hospital Stay: Payer: Medicare Other

## 2023-01-05 ENCOUNTER — Inpatient Hospital Stay: Payer: Medicare Other | Attending: Internal Medicine | Admitting: Internal Medicine

## 2023-01-05 ENCOUNTER — Encounter: Payer: Self-pay | Admitting: Internal Medicine

## 2023-01-05 ENCOUNTER — Encounter: Payer: Self-pay | Admitting: Medical Oncology

## 2023-01-05 VITALS — BP 119/66 | HR 54 | Temp 97.7°F | Resp 16 | Wt 145.5 lb

## 2023-01-05 VITALS — BP 107/57 | HR 75 | Resp 16

## 2023-01-05 DIAGNOSIS — C3482 Malignant neoplasm of overlapping sites of left bronchus and lung: Secondary | ICD-10-CM

## 2023-01-05 DIAGNOSIS — C3490 Malignant neoplasm of unspecified part of unspecified bronchus or lung: Secondary | ICD-10-CM | POA: Diagnosis not present

## 2023-01-05 DIAGNOSIS — C782 Secondary malignant neoplasm of pleura: Secondary | ICD-10-CM | POA: Diagnosis not present

## 2023-01-05 DIAGNOSIS — Z5112 Encounter for antineoplastic immunotherapy: Secondary | ICD-10-CM | POA: Insufficient documentation

## 2023-01-05 DIAGNOSIS — C3412 Malignant neoplasm of upper lobe, left bronchus or lung: Secondary | ICD-10-CM | POA: Insufficient documentation

## 2023-01-05 DIAGNOSIS — Z95828 Presence of other vascular implants and grafts: Secondary | ICD-10-CM

## 2023-01-05 LAB — CMP (CANCER CENTER ONLY)
ALT: 10 U/L (ref 0–44)
AST: 19 U/L (ref 15–41)
Albumin: 3.9 g/dL (ref 3.5–5.0)
Alkaline Phosphatase: 73 U/L (ref 38–126)
Anion gap: 4 — ABNORMAL LOW (ref 5–15)
BUN: 21 mg/dL (ref 8–23)
CO2: 30 mmol/L (ref 22–32)
Calcium: 8.8 mg/dL — ABNORMAL LOW (ref 8.9–10.3)
Chloride: 107 mmol/L (ref 98–111)
Creatinine: 0.94 mg/dL (ref 0.61–1.24)
GFR, Estimated: >60 mL/min (ref >60)
Glucose, Bld: 94 mg/dL (ref 70–99)
Potassium: 4.4 mmol/L (ref 3.5–5.1)
Sodium: 141 mmol/L (ref 135–145)
Total Bilirubin: 0.4 mg/dL (ref 0.3–1.2)
Total Protein: 6.4 g/dL — ABNORMAL LOW (ref 6.5–8.1)

## 2023-01-05 LAB — CBC WITH DIFFERENTIAL (CANCER CENTER ONLY)
Abs Immature Granulocytes: 0.03 10*3/uL (ref 0.00–0.07)
Basophils Absolute: 0 10*3/uL (ref 0.0–0.1)
Basophils Relative: 0 %
Eosinophils Absolute: 0.5 10*3/uL (ref 0.0–0.5)
Eosinophils Relative: 6 %
HCT: 39.5 % (ref 39.0–52.0)
Hemoglobin: 12.8 g/dL — ABNORMAL LOW (ref 13.0–17.0)
Immature Granulocytes: 0 %
Lymphocytes Relative: 23 %
Lymphs Abs: 1.9 10*3/uL (ref 0.7–4.0)
MCH: 30 pg (ref 26.0–34.0)
MCHC: 32.4 g/dL (ref 30.0–36.0)
MCV: 92.7 fL (ref 80.0–100.0)
Monocytes Absolute: 1 10*3/uL (ref 0.1–1.0)
Monocytes Relative: 12 %
Neutro Abs: 5 10*3/uL (ref 1.7–7.7)
Neutrophils Relative %: 59 %
Platelet Count: 187 10*3/uL (ref 150–400)
RBC: 4.26 MIL/uL (ref 4.22–5.81)
RDW: 14.1 % (ref 11.5–15.5)
WBC Count: 8.4 10*3/uL (ref 4.0–10.5)
nRBC: 0 % (ref 0.0–0.2)

## 2023-01-05 MED ORDER — SODIUM CHLORIDE 0.9 % IV SOLN
1500.0000 mg | Freq: Once | INTRAVENOUS | Status: AC
  Administered 2023-01-05: 1500 mg via INTRAVENOUS
  Filled 2023-01-05: qty 30

## 2023-01-05 MED ORDER — SODIUM CHLORIDE 0.9 % IV SOLN: Freq: Once | INTRAVENOUS | Status: AC

## 2023-01-05 MED ORDER — SODIUM CHLORIDE 0.9% FLUSH: 10.0000 mL | Freq: Once | INTRAVENOUS | Status: AC

## 2023-01-05 NOTE — Progress Notes (Signed)
[facility] [phone]   [phone]  OFFICE PROGRESS NOTE  Pedro Corn, Pedro Pope [address] [place] [address]  DIAGNOSIS: Extensive stage (T3, N2, M1b) small cell lung cancer presented with obstructive left upper lobe lung mass in addition to mediastinal lymphadenopathy and malignant left pleural effusion as well as pleural-based metastasis as well as suspicious liver metastasis diagnosed in June 2023.  PRIOR THERAPY: None  CURRENT THERAPY: Palliative systemic chemotherapy with carboplatin initiated for AUC of 4 on day 1 and etoposide 80 Mg/M2 on days 1, 2 and 3 with Cosela before the chemotherapy and Imfinzi 1500 Mg IV on day 1 every 3 weeks.  Status post 13 cycles.  Starting from cycle #5 the patient will be on maintenance treatment with Imfinzi 1500 Mg IV every 4 weeks.   INTERVAL HISTORY: Pedro Pope. 82 y.o. male returns to the clinic today for follow-up visit accompanied by his wife.  The patient is feeling fine today with no concerning complaints except for aching pain on the anterior left chest area that started few days ago.  He was traveling for several days recently.  He denied having any current shortness of breath, cough or hemoptysis.  He has no nausea, vomiting, diarrhea or constipation.  He has no headache or visual changes.  He denied having any fever or chills.  He is here today for evaluation before starting cycle #14 of his treatment with immunotherapy.  MEDICAL HISTORY: Past Medical History:  Diagnosis Date   Anosmia    CAD (coronary artery disease), native coronary artery    a. 10/29/2013 antlat STEMI s/p DES to pLAD; residual 80 % proximal and 60% mid LCx diease   Chronic headaches    Dressler's syndrome (HCC)    a. placed on colchicine    ED (erectile dysfunction)    Elevated TSH    Fatty liver    Gout    Ischemic cardiomyopathy    a. 2D ECHO 11/04/14 w/ EF 40-45% with LV WMA, G2DD, mild MR, mild LA dilation, mod  TR.   PAF (paroxysmal atrial fibrillation) (HCC)    a. Dx on 11/03/14 admission. Not placed on longterm AC due to DAPT w/ receent DES. Will plan for outpt heart monitor    Pre-diabetes    a. HgA1c 6.1 in 10/2014   Prostate cancer [facility])    s/p prostatectomy and XRT   Rheumatic fever 1947    ALLERGIES:  is allergic to penicillins, clindamycin, Pope syrup [glucose], and aspirin adult low [aspirin].  MEDICATIONS:  Current Outpatient Medications  Medication Sig Dispense Refill   acetaminophen (TYLENOL) 500 MG tablet Take 500 mg by mouth every 6 (six) hours as needed for mild pain or moderate pain. (Patient not taking: Reported on 11/04/2022)     albuterol (VENTOLIN HFA) 108 (90 Base) MCG/ACT inhaler Inhale 1-2 puffs into the lungs every 6 (six) hours as needed. (Patient not taking: Reported on 11/04/2022) 8 g 2   ascorbic acid (VITAMIN C) 500 MG tablet Take 250 mg by mouth 2 (two) times daily.     Blood Pressure Monitor KIT For daily blood pressure monitoring;  Arm Cuff 1 each 0   carvedilol (COREG) 3.125 MG tablet Take 1 tablet (3.125 mg total) by mouth daily. 90 tablet 1   Cholecalciferol (VITAMIN D3) 25 MCG (1000 UT) CAPS Take 1 capsule by mouth daily as needed (gout). (Patient not taking: Reported on 11/10/2022)     colchicine 0.6 MG tablet  Take 1 tablet twice a day by oral route. (Patient not taking: Reported on 11/10/2022)     escitalopram (LEXAPRO) 5 MG tablet Take 5 mg by mouth daily.     hydrOXYzine (ATARAX) 10 MG tablet Take 1 tablet (10 mg total) by mouth 3 (three) times daily as needed. (Patient not taking: Reported on 11/10/2022) 30 tablet 0   nystatin (MYCOSTATIN/NYSTOP) powder Apply 1 Application topically 3 (three) times daily. (Patient not taking: Reported on 12/08/2022) 15 g 0   oxyCODONE (ROXICODONE) 5 MG immediate release tablet Take 0.5-1 tablets (2.5-5 mg total) by mouth every 4 (four) hours as needed. (Patient not taking: Reported on 11/04/2022) 30 tablet 0   PROCTO-MED HC 2.5 %  rectal cream Apply topically. (Patient not taking: Reported on 11/10/2022)     triamcinolone cream (KENALOG) 0.1 % Apply 1 Application topically 2 (two) times daily. (Patient not taking: Reported on 12/08/2022) 80 g 0   No current facility-administered medications for this visit.   Facility-Administered Medications Ordered in Other Visits  Medication Dose Route Frequency Provider Last Rate Last Admin   sodium chloride flush (NS) 0.9 % injection 10 mL  10 mL Intracatheter Once Si Gaul, Pedro Pope        SURGICAL HISTORY:  Past Surgical History:  Procedure Laterality Date   CORONARY ANGIOPLASTY WITH STENT PLACEMENT  10/30/14   STEMI s/p DES to proximal LAD; residual 80 % proximal and 60% mid LCx diease   IR IMAGING GUIDED PORT INSERTION  03/12/2022   IR PATIENT EVAL TECH 0-60 MINS  10/20/2022   IR PATIENT EVAL TECH 0-60 MINS  10/27/2022   IR PATIENT EVAL TECH 0-60 MINS  10/30/2022   IR PATIENT EVAL TECH 0-60 MINS  10/23/2022   IR PATIENT EVAL TECH 0-60 MINS  11/06/2022   IR PATIENT EVAL TECH 0-60 MINS  11/13/2022   IR PATIENT EVAL TECH 0-60 MINS  12/01/2022   IR RADIOLOGIST EVAL & MGMT  10/02/2022   IR RADIOLOGIST EVAL & MGMT  10/13/2022   IR RADIOLOGIST EVAL & MGMT  10/14/2022   IR RADIOLOGIST EVAL & MGMT  10/17/2022   IR RADIOLOGIST EVAL & MGMT  10/20/2022   IR REMOVAL TUN ACCESS W/ PORT W/O FL MOD SED  10/13/2022   LEFT HEART CATHETERIZATION WITH CORONARY ANGIOGRAM N/A 10/30/2014   Procedure: LEFT HEART CATHETERIZATION WITH CORONARY ANGIOGRAM;  Surgeon: Runell Gess, Pedro Pope;  Location: [facility] CATH LAB;  Service: Cardiovascular;  Laterality: N/A;   PERCUTANEOUS CORONARY STENT INTERVENTION (PCI-S)  10/30/2014   Procedure: PERCUTANEOUS CORONARY STENT INTERVENTION (PCI-S);  Surgeon: Runell Gess, Pedro Pope;  Location: [facility] CATH LAB;  Service: Cardiovascular;;   PROSTATECTOMY     THORACENTESIS Left 12/31/2021   Procedure: THORACENTESIS;  Surgeon: Lorin Glass, Pedro Pope;  Location: [facility] ENDOSCOPY;  Service: Pulmonary;   Laterality: Left;   TONSILLECTOMY     VIDEO ASSISTED THORACOSCOPY (VATS)/DECORTICATION Left 01/21/2022   Procedure: VIDEO ASSISTED THORACOSCOPY (VATS)/DECORTICATION;  Surgeon: Corliss Skains, Pedro Pope;  Location: [facility] OR;  Service: Thoracic;  Laterality: Left;    REVIEW OF SYSTEMS:  A comprehensive review of systems was negative except for: Respiratory: positive for pleurisy/chest pain   PHYSICAL EXAMINATION: General appearance: alert, cooperative, and no distress Head: Normocephalic, without obvious abnormality, atraumatic Neck: no adenopathy, no JVD, supple, symmetrical, trachea midline, and thyroid not enlarged, symmetric, no tenderness/mass/nodules Lymph nodes: Cervical, supraclavicular, and axillary nodes normal. Resp: clear to auscultation bilaterally Back: symmetric, no curvature. ROM normal. No CVA tenderness. Cardio: regular rate and  rhythm, S1, S2 normal, no murmur, click, rub or gallop GI: soft, non-tender; bowel sounds normal; no masses,  no organomegaly Extremities: extremities normal, atraumatic, no cyanosis or edema  ECOG PERFORMANCE STATUS: 1 - Symptomatic but completely ambulatory  Blood pressure 119/66, pulse (!) 54, temperature 97.7 F (36.5 C), resp. rate 16, weight 145 lb 8 oz (66 kg), SpO2 98 %.  LABORATORY DATA: Lab Results  Component Value Date   WBC 6.7 12/08/2022   HGB 12.8 (L) 12/08/2022   HCT 40.1 12/08/2022   MCV 92.8 12/08/2022   PLT 162 12/08/2022      Chemistry      Component Value Date/Time   NA 142 12/08/2022 0755   K 3.8 12/08/2022 0755   CL 107 12/08/2022 0755   CO2 27 12/08/2022 0755   BUN 17 12/08/2022 0755   CREATININE 1.00 12/08/2022 0755      Component Value Date/Time   CALCIUM 9.2 12/08/2022 0755   ALKPHOS 71 12/08/2022 0755   AST 16 12/08/2022 0755   ALT 8 12/08/2022 0755   BILITOT 0.4 12/08/2022 0755       RADIOGRAPHIC STUDIES: No results found.  ASSESSMENT AND PLAN: This is a very pleasant 82 years old white male with  Extensive stage (T3, N2, M1a) small cell lung cancer presented with obstructive left upper lobe lung mass in addition to mediastinal lymphadenopathy and malignant left pleural effusion as well as pleural-based metastasis and suspicious liver metastasis diagnosed in June 2023. The patient is currently undergoing systemic chemotherapy with carboplatin for AUC of 4 on day 1, etoposide 80 Mg/M2 on days 1, 2 and 3 with Imfinzi 240 Mg/M2 on the days before chemotherapy and Imfinzi 1500 Mg IV on day 1 every 3 weeks.  Status post 13 cycles.  Starting from cycle #5 he will be on maintenance treatment with immunotherapy with Imfinzi 1500 Mg IV every 4 weeks. The patient has been tolerating this treatment well with no concerning adverse effects. I recommended for him to proceed with cycle #14 today as planned. I will see him back for follow-up visit in 4 weeks for evaluation with repeat CT scan of the chest, abdomen and pelvis for restaging of his disease. The patient was advised to call immediately if he has any concerning symptoms in the interval. The patient voices understanding of current disease status and treatment options and is in agreement with the current care plan.  All questions were answered. The patient knows to call the clinic with any problems, questions or concerns. We can certainly see the patient much sooner if necessary.  The total time spent in the appointment was 20 minutes.  Disclaimer: This note was dictated with voice recognition software. Similar sounding words can inadvertently be transcribed and may not be corrected upon review.

## 2023-01-05 NOTE — Patient Instructions (Signed)
[facility]  Discharge Instructions: Thank you for choosing [facility] to provide your oncology and hematology care.   If you have a lab appointment with the Cancer Center, please go directly to the Cancer Center and check in at the registration area.   Wear comfortable clothing and clothing appropriate for easy access to any Portacath or PICC line.   We strive to give you quality time with your provider. You may need to reschedule your appointment if you arrive late (15 or more minutes).  Arriving late affects you and other patients whose appointments are after yours.  Also, if you miss three or more appointments without notifying the office, you may be dismissed from the clinic at the provider's discretion.      For prescription refill requests, have your pharmacy contact our office and allow 72 hours for refills to be completed.    Today you received the following chemotherapy and/or immunotherapy agents: Imfinzi      To help prevent nausea and vomiting after your treatment, we encourage you to take your nausea medication as directed.  BELOW ARE SYMPTOMS THAT SHOULD BE REPORTED IMMEDIATELY: *FEVER GREATER THAN 100.4 F (38 C) OR HIGHER *CHILLS OR SWEATING *NAUSEA AND VOMITING THAT IS NOT CONTROLLED WITH YOUR NAUSEA MEDICATION *UNUSUAL SHORTNESS OF BREATH *UNUSUAL BRUISING OR BLEEDING *URINARY PROBLEMS (pain or burning when urinating, or frequent urination) *BOWEL PROBLEMS (unusual diarrhea, constipation, pain near the anus) TENDERNESS IN MOUTH AND THROAT WITH OR WITHOUT PRESENCE OF ULCERS (sore throat, sores in mouth, or a toothache) UNUSUAL RASH, SWELLING OR PAIN  UNUSUAL VAGINAL DISCHARGE OR ITCHING   Items with * indicate a potential emergency and should be followed up as soon as possible or go to the Emergency Department if any problems should occur.  Please show the CHEMOTHERAPY ALERT CARD or IMMUNOTHERAPY ALERT CARD at  check-in to the Emergency Department and triage nurse.  Should you have questions after your visit or need to cancel or reschedule your appointment, please contact [facility]  Dept: [phone]  and follow the prompts.  Office hours are 8:00 a.m. to 4:30 p.m. Monday - Friday. Please note that voicemails left after 4:00 p.m. may not be returned until the following business day.  We are closed weekends and major holidays. You have access to a nurse at all times for urgent questions. Please call the main number to the clinic Dept: [phone] and follow the prompts.   For any non-urgent questions, you may also contact your provider using MyChart. We now offer e-Visits for anyone 18 and older to request care online for non-urgent symptoms. For details visit [email or web address].   Also download the MyChart app! Go to the app store, search "MyChart", open the app, select , and log in with your MyChart username and password.   

## 2023-01-05 NOTE — Progress Notes (Signed)
Patient seen by Dr. Mohamed  Vitals are within treatment parameters.   Labs reviewed: and are within treatment parameters.  Per physician team, patient is ready for treatment and there are NO modifications to the treatment plan.  Durvalumab 

## 2023-01-13 ENCOUNTER — Other Ambulatory Visit: Payer: Self-pay

## 2023-01-29 ENCOUNTER — Encounter (HOSPITAL_COMMUNITY): Payer: Self-pay

## 2023-01-29 ENCOUNTER — Ambulatory Visit (HOSPITAL_COMMUNITY)
Admission: RE | Admit: 2023-01-29 | Discharge: 2023-01-29 | Disposition: A | Discharge status: Home / Self Care | Payer: Medicare Other | Source: Ambulatory Visit | Attending: Internal Medicine | Admitting: Internal Medicine

## 2023-01-29 DIAGNOSIS — C3490 Malignant neoplasm of unspecified part of unspecified bronchus or lung: Secondary | ICD-10-CM | POA: Insufficient documentation

## 2023-01-29 DIAGNOSIS — N200 Calculus of kidney: Secondary | ICD-10-CM | POA: Diagnosis not present

## 2023-01-29 DIAGNOSIS — J432 Centrilobular emphysema: Secondary | ICD-10-CM | POA: Diagnosis not present

## 2023-01-29 MED ORDER — SODIUM CHLORIDE (PF) 0.9 % IJ SOLN
INTRAMUSCULAR | Status: AC
  Filled 2023-01-29: qty 50

## 2023-01-29 MED ORDER — IOHEXOL 300 MG/ML  SOLN
75.0000 mL | Freq: Once | INTRAMUSCULAR | Status: AC | PRN
  Administered 2023-01-29: 75 mL via INTRAVENOUS

## 2023-01-30 ENCOUNTER — Other Ambulatory Visit: Payer: Self-pay

## 2023-02-02 ENCOUNTER — Other Ambulatory Visit: Payer: Medicare Other

## 2023-02-02 ENCOUNTER — Other Ambulatory Visit: Payer: Self-pay

## 2023-02-02 ENCOUNTER — Inpatient Hospital Stay: Payer: Medicare Other

## 2023-02-02 ENCOUNTER — Inpatient Hospital Stay (HOSPITAL_BASED_OUTPATIENT_CLINIC_OR_DEPARTMENT_OTHER): Payer: Medicare Other | Admitting: Internal Medicine

## 2023-02-02 ENCOUNTER — Inpatient Hospital Stay: Payer: Medicare Other | Attending: Internal Medicine

## 2023-02-02 VITALS — BP 120/51 | HR 54 | Temp 97.6°F | Resp 17 | Ht 68.0 in | Wt 145.2 lb

## 2023-02-02 VITALS — BP 116/49 | HR 54 | Resp 16

## 2023-02-02 DIAGNOSIS — Z5112 Encounter for antineoplastic immunotherapy: Secondary | ICD-10-CM | POA: Diagnosis not present

## 2023-02-02 DIAGNOSIS — C3412 Malignant neoplasm of upper lobe, left bronchus or lung: Secondary | ICD-10-CM | POA: Diagnosis not present

## 2023-02-02 DIAGNOSIS — J910 Malignant pleural effusion: Secondary | ICD-10-CM | POA: Diagnosis not present

## 2023-02-02 DIAGNOSIS — C787 Secondary malignant neoplasm of liver and intrahepatic bile duct: Secondary | ICD-10-CM | POA: Insufficient documentation

## 2023-02-02 DIAGNOSIS — C3482 Malignant neoplasm of overlapping sites of left bronchus and lung: Secondary | ICD-10-CM

## 2023-02-02 DIAGNOSIS — C782 Secondary malignant neoplasm of pleura: Secondary | ICD-10-CM | POA: Insufficient documentation

## 2023-02-02 LAB — CBC WITH DIFFERENTIAL (CANCER CENTER ONLY)
Abs Immature Granulocytes: 0.02 10*3/uL (ref 0.00–0.07)
Basophils Absolute: 0 10*3/uL (ref 0.0–0.1)
Basophils Relative: 1 %
Eosinophils Absolute: 0.3 10*3/uL (ref 0.0–0.5)
Eosinophils Relative: 4 %
HCT: 38.7 % — ABNORMAL LOW (ref 39.0–52.0)
Hemoglobin: 12.8 g/dL — ABNORMAL LOW (ref 13.0–17.0)
Immature Granulocytes: 0 %
Lymphocytes Relative: 21 %
Lymphs Abs: 1.7 10*3/uL (ref 0.7–4.0)
MCH: 30.5 pg (ref 26.0–34.0)
MCHC: 33.1 g/dL (ref 30.0–36.0)
MCV: 92.1 fL (ref 80.0–100.0)
Monocytes Absolute: 0.9 10*3/uL (ref 0.1–1.0)
Monocytes Relative: 12 %
Neutro Abs: 5 10*3/uL (ref 1.7–7.7)
Neutrophils Relative %: 62 %
Platelet Count: 176 10*3/uL (ref 150–400)
RBC: 4.2 MIL/uL — ABNORMAL LOW (ref 4.22–5.81)
RDW: 13.5 % (ref 11.5–15.5)
WBC Count: 7.9 10*3/uL (ref 4.0–10.5)
nRBC: 0 % (ref 0.0–0.2)

## 2023-02-02 LAB — CMP (CANCER CENTER ONLY)
ALT: 8 U/L (ref 0–44)
AST: 17 U/L (ref 15–41)
Albumin: 3.6 g/dL (ref 3.5–5.0)
Alkaline Phosphatase: 65 U/L (ref 38–126)
Anion gap: 3 — ABNORMAL LOW (ref 5–15)
BUN: 18 mg/dL (ref 8–23)
CO2: 30 mmol/L (ref 22–32)
Calcium: 9.1 mg/dL (ref 8.9–10.3)
Chloride: 107 mmol/L (ref 98–111)
Creatinine: 0.99 mg/dL (ref 0.61–1.24)
GFR, Estimated: >60 mL/min (ref >60)
Glucose, Bld: 92 mg/dL (ref 70–99)
Potassium: 4.4 mmol/L (ref 3.5–5.1)
Sodium: 140 mmol/L (ref 135–145)
Total Bilirubin: 0.4 mg/dL (ref 0.3–1.2)
Total Protein: 5.9 g/dL — ABNORMAL LOW (ref 6.5–8.1)

## 2023-02-02 MED ORDER — SODIUM CHLORIDE 0.9% FLUSH: 10.0000 mL | INTRAVENOUS | Status: DC | PRN

## 2023-02-02 MED ORDER — HYDROCORTISONE 1 % EX LOTN: 1.0000 | TOPICAL_LOTION | Freq: Two times a day (BID) | CUTANEOUS | 0 refills | Status: DC

## 2023-02-02 MED ORDER — SODIUM CHLORIDE 0.9 % IV SOLN
1500.0000 mg | Freq: Once | INTRAVENOUS | Status: AC
  Administered 2023-02-02: 1500 mg via INTRAVENOUS
  Filled 2023-02-02: qty 30

## 2023-02-02 MED ORDER — HEPARIN SOD (PORK) LOCK FLUSH 100 UNIT/ML IV SOLN: 500.0000 [IU] | Freq: Once | INTRAVENOUS | Status: DC | PRN

## 2023-02-02 MED ORDER — SODIUM CHLORIDE 0.9 % IV SOLN: Freq: Once | INTRAVENOUS | Status: AC

## 2023-02-02 NOTE — Progress Notes (Signed)
[facility] [phone]   [phone]  OFFICE PROGRESS NOTE  [name], MD [address] [place] [address]  DIAGNOSIS: Extensive stage (T3, N2, M1b) small cell lung cancer presented with obstructive left upper lobe lung mass in addition to mediastinal lymphadenopathy and malignant left pleural effusion as well as pleural-based metastasis as well as suspicious liver metastasis diagnosed in June 2023.  PRIOR THERAPY: None  CURRENT THERAPY: Palliative systemic chemotherapy with carboplatin initiated for AUC of 4 on day 1 and etoposide 80 Mg/M2 on days 1, 2 and 3 with Cosela before the chemotherapy and Imfinzi 1500 Mg IV on day 1 every 3 weeks.  Status post 14 cycles.  Starting from cycle #5 the patient will be on maintenance treatment with Imfinzi 1500 Mg IV every 4 weeks.   INTERVAL HISTORY: Pedro Pope. 82 y.o. male returns to the clinic today for follow-up visit accompanied by his wife.  The patient is feeling fine today with no concerning complaints except for few areas of skin rash on the chin as well as close to the left eye.  He denied having any current chest pain, shortness of breath except with exertion with no cough or hemoptysis.  He has no nausea, vomiting, diarrhea or constipation.  He has no headache or visual changes.  He denied having any recent weight loss or night sweats.  He has been tolerating his treatment with Imfinzi fairly well.  The patient had repeat CT scan of the chest, abdomen and pelvis performed recently and is here for evaluation and discussion of his scan results.  MEDICAL HISTORY: Past Medical History:  Diagnosis Date   Anosmia    CAD (coronary artery disease), native coronary artery    a. 10/29/2013 antlat STEMI s/p DES to pLAD; residual 80 % proximal and 60% mid LCx diease   Chronic headaches    Dressler's syndrome (HCC)    a. placed on colchicine    ED (erectile dysfunction)    Elevated TSH    Fatty  liver    Gout    Ischemic cardiomyopathy    a. 2D ECHO 11/04/14 w/ EF 40-45% with LV WMA, G2DD, mild MR, mild LA dilation, mod TR.   PAF (paroxysmal atrial fibrillation) (HCC)    a. Dx on 11/03/14 admission. Not placed on longterm AC due to DAPT w/ receent DES. Will plan for outpt heart monitor    Pre-diabetes    a. HgA1c 6.1 in 10/2014   Prostate cancer [facility])    s/p prostatectomy and XRT   Rheumatic fever 1947    ALLERGIES:  is allergic to penicillins, clindamycin, corn syrup [glucose], and aspirin adult low [aspirin].  MEDICATIONS:  Current Outpatient Medications  Medication Sig Dispense Refill   acetaminophen (TYLENOL) 500 MG tablet Take 500 mg by mouth every 6 (six) hours as needed for mild pain or moderate pain. (Patient not taking: Reported on 11/04/2022)     albuterol (VENTOLIN HFA) 108 (90 Base) MCG/ACT inhaler Inhale 1-2 puffs into the lungs every 6 (six) hours as needed. (Patient not taking: Reported on 11/04/2022) 8 g 2   ascorbic acid (VITAMIN C) 500 MG tablet Take 250 mg by mouth 2 (two) times daily. (Patient not taking: Reported on 01/05/2023)     Blood Pressure Monitor KIT For daily blood pressure monitoring;  Arm Cuff 1 each 0   carvedilol (COREG) 3.125 MG tablet Take 1 tablet (3.125 mg total) by mouth daily. 90 tablet 1  Cholecalciferol (VITAMIN D3) 25 MCG (1000 UT) CAPS Take 1 capsule by mouth daily as needed (gout). (Patient not taking: Reported on 11/10/2022)     colchicine 0.6 MG tablet Take 1 tablet twice a day by oral route. (Patient not taking: Reported on 11/10/2022)     escitalopram (LEXAPRO) 5 MG tablet Take 5 mg by mouth daily.     hydrOXYzine (ATARAX) 10 MG tablet Take 1 tablet (10 mg total) by mouth 3 (three) times daily as needed. (Patient not taking: Reported on 11/10/2022) 30 tablet 0   nystatin (MYCOSTATIN/NYSTOP) powder Apply 1 Application topically 3 (three) times daily. (Patient not taking: Reported on 12/08/2022) 15 g 0   PROCTO-MED HC 2.5 % rectal cream  Apply topically. (Patient not taking: Reported on 11/10/2022)     triamcinolone cream (KENALOG) 0.1 % Apply 1 Application topically 2 (two) times daily. (Patient not taking: Reported on 12/08/2022) 80 g 0   No current facility-administered medications for this visit.   Facility-Administered Medications Ordered in Other Visits  Medication Dose Route Frequency Provider Last Rate Last Admin   sodium chloride flush (NS) 0.9 % injection 10 mL  10 mL Intracatheter Once Si Gaul, MD        SURGICAL HISTORY:  Past Surgical History:  Procedure Laterality Date   CORONARY ANGIOPLASTY WITH STENT PLACEMENT  10/30/14   STEMI s/p DES to proximal LAD; residual 80 % proximal and 60% mid LCx diease   IR IMAGING GUIDED PORT INSERTION  03/12/2022   IR PATIENT EVAL TECH 0-60 MINS  10/20/2022   IR PATIENT EVAL TECH 0-60 MINS  10/27/2022   IR PATIENT EVAL TECH 0-60 MINS  10/30/2022   IR PATIENT EVAL TECH 0-60 MINS  10/23/2022   IR PATIENT EVAL TECH 0-60 MINS  11/06/2022   IR PATIENT EVAL TECH 0-60 MINS  11/13/2022   IR PATIENT EVAL TECH 0-60 MINS  12/01/2022   IR RADIOLOGIST EVAL & MGMT  10/02/2022   IR RADIOLOGIST EVAL & MGMT  10/13/2022   IR RADIOLOGIST EVAL & MGMT  10/14/2022   IR RADIOLOGIST EVAL & MGMT  10/17/2022   IR RADIOLOGIST EVAL & MGMT  10/20/2022   IR REMOVAL TUN ACCESS W/ PORT W/O FL MOD SED  10/13/2022   LEFT HEART CATHETERIZATION WITH CORONARY ANGIOGRAM N/A 10/30/2014   Procedure: LEFT HEART CATHETERIZATION WITH CORONARY ANGIOGRAM;  Surgeon: Runell Gess, MD;  Location[facility] CATH LAB;  Service: Cardiovascular;  Laterality: N/A;   PERCUTANEOUS CORONARY STENT INTERVENTION (PCI-S)  10/30/2014   Procedure: PERCUTANEOUS CORONARY STENT INTERVENTION (PCI-S);  Surgeon: Runell Gess, MD;  Location: [facility] CATH LAB;  Service: Cardiovascular;;   PROSTATECTOMY     THORACENTESIS Left 12/31/2021   Procedure: THORACENTESIS;  Surgeon: Lorin Glass, MD;  Location: [facility] ENDOSCOPY;  Service: Pulmonary;  Laterality:  Left;   TONSILLECTOMY     VIDEO ASSISTED THORACOSCOPY (VATS)/DECORTICATION Left 01/21/2022   Procedure: VIDEO ASSISTED THORACOSCOPY (VATS)/DECORTICATION;  Surgeon: Corliss Skains, MD;  Location: [facility] OR;  Service: Thoracic;  Laterality: Left;    REVIEW OF SYSTEMS:  Constitutional: positive for fatigue Eyes: negative Ears, nose, mouth, throat, and face: negative Respiratory: negative Cardiovascular: negative Gastrointestinal: negative Genitourinary:negative Integument/breast: positive for rash Hematologic/lymphatic: negative Musculoskeletal:negative Neurological: negative Behavioral/Psych: negative Endocrine: negative Allergic/Immunologic: negative   PHYSICAL EXAMINATION: General appearance: alert, cooperative, and no distress Head: Normocephalic, without obvious abnormality, atraumatic Neck: no adenopathy, no JVD, supple, symmetrical, trachea midline, and thyroid not enlarged, symmetric, no tenderness/mass/nodules Lymph nodes: Cervical, supraclavicular, and axillary  nodes normal. Resp: clear to auscultation bilaterally Back: symmetric, no curvature. ROM normal. No CVA tenderness. Cardio: regular rate and rhythm, S1, S2 normal, no murmur, click, rub or gallop GI: soft, non-tender; bowel sounds normal; no masses,  no organomegaly Extremities: extremities normal, atraumatic, no cyanosis or edema Neurologic: Alert and oriented X 3, normal strength and tone. Normal symmetric reflexes. Normal coordination and gait  ECOG PERFORMANCE STATUS: 1 - Symptomatic but completely ambulatory  Blood pressure (!) 120/51, pulse (!) 54, temperature 97.6 F (36.4 C), temperature source Oral, resp. rate 17, height 5\' 8"  (1.727 m), weight 145 lb 3.2 oz (65.9 kg).  LABORATORY DATA: Lab Results  Component Value Date   WBC 7.9 02/02/2023   HGB 12.8 (L) 02/02/2023   HCT 38.7 (L) 02/02/2023   MCV 92.1 02/02/2023   PLT 176 02/02/2023      Chemistry      Component Value Date/Time   NA 141  01/05/2023 1001   K 4.4 01/05/2023 1001   CL 107 01/05/2023 1001   CO2 30 01/05/2023 1001   BUN 21 01/05/2023 1001   CREATININE 0.94 01/05/2023 1001      Component Value Date/Time   CALCIUM 8.8 (L) 01/05/2023 1001   ALKPHOS 73 01/05/2023 1001   AST 19 01/05/2023 1001   ALT 10 01/05/2023 1001   BILITOT 0.4 01/05/2023 1001       RADIOGRAPHIC STUDIES: CT Chest W Contrast  Result Date: 02/02/2023 CLINICAL DATA:  Restaging small cell lung cancer. The patient reports some chest pain with deep inspiration. Undergoing immunotherapy. * Tracking Code: BO * EXAM: CT CHEST, ABDOMEN, AND PELVIS WITH CONTRAST TECHNIQUE: Multidetector CT imaging of the chest, abdomen and pelvis was performed following the standard protocol during bolus administration of intravenous contrast. RADIATION DOSE REDUCTION: This exam was performed according to the departmental dose-optimization program which includes automated exposure control, adjustment of the mA and/or kV according to patient size and/or use of iterative reconstruction technique. CONTRAST:  75mL OMNIPAQUE IOHEXOL 300 MG/ML  SOLN COMPARISON:  Prior CTs 12/03/2022 and 08/25/2022. FINDINGS: CT CHEST FINDINGS Cardiovascular: No acute vascular findings. Extensive atherosclerosis of the aorta, great vessels and coronary arteries again noted. There are calcifications of the aortic valve and mitral annulus. The heart size is normal. There is no pericardial effusion. Mediastinum/Nodes: There are no enlarged mediastinal, hilar or axillary lymph nodes. Small mediastinal and hilar lymph nodes appear unchanged. Stable small hiatal hernia. The thyroid gland and trachea appear unremarkable. Lungs/Pleura: No pleural effusion or pneumothorax. Stable chronic fibrotic lung disease with subpleural reticulation, honeycomb formation and traction bronchiectasis. Underlying mild to moderate centrilobular and paraseptal emphysema. No recurrent pulmonary nodule or confluent airspace  disease. Musculoskeletal/Chest wall: No chest wall mass or suspicious osseous findings. CT ABDOMEN AND PELVIS FINDINGS Hepatobiliary: The liver is normal in density without suspicious focal abnormality. No evidence of gallstones, gallbladder wall thickening or biliary dilatation. Pancreas: Unremarkable. No pancreatic ductal dilatation or surrounding inflammatory changes. Spleen: Normal in size without focal abnormality. Adrenals/Urinary Tract: Both adrenal glands appear normal. Stable nonobstructing calculus in the lower pole of the right kidney. No evidence of ureteral calculus, hydronephrosis or renal mass. Stable mild nonspecific bladder wall thickening. Stomach/Bowel: No enteric contrast administered. The stomach appears unremarkable for its degree of distension. No evidence of bowel wall thickening, distention or surrounding inflammatory change. The appendix appears normal. Vascular/Lymphatic: Stable 9 mm lymph node in the gastrohepatic ligament on image 60/2, unchanged from 08/25/2022. No progressively enlarged abdominopelvic lymph nodes are identified. There  is diffuse aortic and branch vessel atherosclerosis with chronic short segment occlusion of the left common iliac artery with distal reconstitution. No acute vascular findings or aneurysm demonstrated. Reproductive: Status post prostatectomy. Other: Surgical clips in the low pelvis. There is a small umbilical hernia containing only fat. No ascites, peritoneal nodularity or pneumoperitoneum. Musculoskeletal: No acute or significant osseous findings. Mild lumbar spondylosis. IMPRESSION: 1. Stable CTS of the chest, abdomen and pelvis status post prostatectomy. A mildly prominent lymph node in the gastrohepatic ligament is unchanged from 08/25/2022, stability favoring a reactive node. No progressive adenopathy. No evidence of local recurrence or distant metastatic disease. 2. Stable chronic fibrotic lung disease. 3. Stable nonobstructing right renal calculus.  4. Aortic Atherosclerosis (ICD10-I70.0) and Emphysema (ICD10-J43.9). 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 Electronically Signed   By: Carey Bullocks M.D.   On: 02/02/2023 11:07   CT Abdomen Pelvis W Contrast  Result Date: 02/02/2023 CLINICAL DATA:  Restaging small cell lung cancer. The patient reports some chest pain with deep inspiration. Undergoing immunotherapy. * Tracking Code: BO * EXAM: CT CHEST, ABDOMEN, AND PELVIS WITH CONTRAST TECHNIQUE: Multidetector CT imaging of the chest, abdomen and pelvis was performed following the standard protocol during bolus administration of intravenous contrast. RADIATION DOSE REDUCTION: This exam was performed according to the departmental dose-optimization program which includes automated exposure control, adjustment of the mA and/or kV according to patient size and/or use of iterative reconstruction technique. CONTRAST:  75mL OMNIPAQUE IOHEXOL 300 MG/ML  SOLN COMPARISON:  Prior CTs 12/03/2022 and 08/25/2022. FINDINGS: CT CHEST FINDINGS Cardiovascular: No acute vascular findings. Extensive atherosclerosis of the aorta, great vessels and coronary arteries again noted. There are calcifications of the aortic valve and mitral annulus. The heart size is normal. There is no pericardial effusion. Mediastinum/Nodes: There are no enlarged mediastinal, hilar or axillary lymph nodes. Small mediastinal and hilar lymph nodes appear unchanged. Stable small hiatal hernia. The thyroid gland and trachea appear unremarkable. Lungs/Pleura: No pleural effusion or pneumothorax. Stable chronic fibrotic lung disease with subpleural reticulation, honeycomb formation and traction bronchiectasis. Underlying mild to moderate centrilobular and paraseptal emphysema. No recurrent pulmonary nodule or confluent airspace disease. Musculoskeletal/Chest wall: No chest wall mass or suspicious osseous findings. CT ABDOMEN AND PELVIS FINDINGS Hepatobiliary: The liver is normal in  density without suspicious focal abnormality. No evidence of gallstones, gallbladder wall thickening or biliary dilatation. Pancreas: Unremarkable. No pancreatic ductal dilatation or surrounding inflammatory changes. Spleen: Normal in size without focal abnormality. Adrenals/Urinary Tract: Both adrenal glands appear normal. Stable nonobstructing calculus in the lower pole of the right kidney. No evidence of ureteral calculus, hydronephrosis or renal mass. Stable mild nonspecific bladder wall thickening. Stomach/Bowel: No enteric contrast administered. The stomach appears unremarkable for its degree of distension. No evidence of bowel wall thickening, distention or surrounding inflammatory change. The appendix appears normal. Vascular/Lymphatic: Stable 9 mm lymph node in the gastrohepatic ligament on image 60/2, unchanged from 08/25/2022. No progressively enlarged abdominopelvic lymph nodes are identified. There is diffuse aortic and branch vessel atherosclerosis with chronic short segment occlusion of the left common iliac artery with distal reconstitution. No acute vascular findings or aneurysm demonstrated. Reproductive: Status post prostatectomy. Other: Surgical clips in the low pelvis. There is a small umbilical hernia containing only fat. No ascites, peritoneal nodularity or pneumoperitoneum. Musculoskeletal: No acute or significant osseous findings. Mild lumbar spondylosis. IMPRESSION: 1. Stable CTS of the chest, abdomen and pelvis status post prostatectomy. A mildly prominent lymph node in the gastrohepatic ligament is  unchanged from 08/25/2022, stability favoring a reactive node. No progressive adenopathy. No evidence of local recurrence or distant metastatic disease. 2. Stable chronic fibrotic lung disease. 3. Stable nonobstructing right renal calculus. 4. Aortic Atherosclerosis (ICD10-I70.0) and Emphysema (ICD10-J43.9). 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 Electronically Signed    By: Carey Bullocks M.D.   On: 02/02/2023 11:07    ASSESSMENT AND PLAN: This is a very pleasant 82 years old white male with Extensive stage (T3, N2, M1a) small cell lung cancer presented with obstructive left upper lobe lung mass in addition to mediastinal lymphadenopathy and malignant left pleural effusion as well as pleural-based metastasis and suspicious liver metastasis diagnosed in June 2023. The patient is currently undergoing systemic chemotherapy with carboplatin for AUC of 4 on day 1, etoposide 80 Mg/M2 on days 1, 2 and 3 with Imfinzi 240 Mg/M2 on the days before chemotherapy and Imfinzi 1500 Mg IV on day 1 every 3 weeks.  Status post 14 cycles.  Starting from cycle #5 he will be on maintenance treatment with immunotherapy with Imfinzi 1500 Mg IV every 4 weeks. The patient has been tolerating this treatment well with no concerning adverse effects except for the mild skin rash on the face. He had repeat CT scan of the chest, abdomen and pelvis performed recently.  I personally and independently reviewed the scan and discussed the result with the patient and his wife. His scan showed no concerning findings for disease progression. I recommended for him to continue his current treatment with Imfinzi and he will proceed with cycle #15 today. He will come back for follow-up visit in 4 weeks for evaluation before the next cycle of his treatment. For the skin rash, I will start him on hydrocortisone lotion 1% 2-3 times a day as needed. The patient was advised to call immediately if he has any other concerning symptoms in the interval. The patient voices understanding of current disease status and treatment options and is in agreement with the current care plan.  All questions were answered. The patient knows to call the clinic with any problems, questions or concerns. We can certainly see the patient much sooner if necessary.  The total time spent in the appointment was 30 minutes.  Disclaimer:  This note was dictated with voice recognition software. Similar sounding words can inadvertently be transcribed and may not be corrected upon review.

## 2023-02-02 NOTE — Patient Instructions (Signed)
[facility]  Discharge Instructions: Thank you for choosing [facility] to provide your oncology and hematology care.   If you have a lab appointment with the Cancer Center, please go directly to the Cancer Center and check in at the registration area.   Wear comfortable clothing and clothing appropriate for easy access to any Portacath or PICC line.   We strive to give you quality time with your provider. You may need to reschedule your appointment if you arrive late (15 or more minutes).  Arriving late affects you and other patients whose appointments are after yours.  Also, if you miss three or more appointments without notifying the office, you may be dismissed from the clinic at the provider's discretion.      For prescription refill requests, have your pharmacy contact our office and allow 72 hours for refills to be completed.    Today you received the following chemotherapy and/or immunotherapy agents: Imfinzi      To help prevent nausea and vomiting after your treatment, we encourage you to take your nausea medication as directed.  BELOW ARE SYMPTOMS THAT SHOULD BE REPORTED IMMEDIATELY: *FEVER GREATER THAN 100.4 F (38 C) OR HIGHER *CHILLS OR SWEATING *NAUSEA AND VOMITING THAT IS NOT CONTROLLED WITH YOUR NAUSEA MEDICATION *UNUSUAL SHORTNESS OF BREATH *UNUSUAL BRUISING OR BLEEDING *URINARY PROBLEMS (pain or burning when urinating, or frequent urination) *BOWEL PROBLEMS (unusual diarrhea, constipation, pain near the anus) TENDERNESS IN MOUTH AND THROAT WITH OR WITHOUT PRESENCE OF ULCERS (sore throat, sores in mouth, or a toothache) UNUSUAL RASH, SWELLING OR PAIN  UNUSUAL VAGINAL DISCHARGE OR ITCHING   Items with * indicate a potential emergency and should be followed up as soon as possible or go to the Emergency Department if any problems should occur.  Please show the CHEMOTHERAPY ALERT CARD or IMMUNOTHERAPY ALERT CARD at  check-in to the Emergency Department and triage nurse.  Should you have questions after your visit or need to cancel or reschedule your appointment, please contact [facility]  Dept: [phone]  and follow the prompts.  Office hours are 8:00 a.m. to 4:30 p.m. Monday - Friday. Please note that voicemails left after 4:00 p.m. may not be returned until the following business day.  We are closed weekends and major holidays. You have access to a nurse at all times for urgent questions. Please call the main number to the clinic Dept: [phone] and follow the prompts.   For any non-urgent questions, you may also contact your provider using MyChart. We now offer e-Visits for anyone 18 and older to request care online for non-urgent symptoms. For details visit [email or web address].   Also download the MyChart app! Go to the app store, search "MyChart", open the app, select [facility], and log in with your MyChart username and password.   

## 2023-02-04 ENCOUNTER — Ambulatory Visit (INDEPENDENT_AMBULATORY_CARE_PROVIDER_SITE_OTHER): Payer: Medicare Other | Admitting: Internal Medicine

## 2023-02-04 DIAGNOSIS — J84112 Idiopathic pulmonary fibrosis: Secondary | ICD-10-CM

## 2023-02-04 LAB — PULMONARY FUNCTION TEST
DL/VA % pred: 58 %
DL/VA: 2.27 ml/min/mmHg/L
DLCO cor % pred: 44 %
DLCO cor: 10.41 ml/min/mmHg
DLCO unc % pred: 42 %
DLCO unc: 9.84 ml/min/mmHg
FEF 25-75 Pre: 1.41 L/sec
FEF2575-%Pred-Pre: 79 %
FEV1-%Pred-Pre: 80 %
FEV1-Pre: 2.13 L
FEV1FVC-%Pred-Pre: 87 %
FEV6-%Pred-Pre: 97 %
FEV6-Pre: 3.41 L
FEV6FVC-%Pred-Pre: 107 %
FVC-%Pred-Pre: 90 %
FVC-Pre: 3.41 L
Pre FEV1/FVC ratio: 62 %
Pre FEV6/FVC Ratio: 100 %

## 2023-02-04 NOTE — Patient Instructions (Signed)
Spiro/DLCO performed today.  

## 2023-02-04 NOTE — Progress Notes (Signed)
Spiro/DLCO performed today.  

## 2023-02-05 ENCOUNTER — Telehealth: Payer: Self-pay | Admitting: Internal Medicine

## 2023-02-05 DIAGNOSIS — J84112 Idiopathic pulmonary fibrosis: Secondary | ICD-10-CM

## 2023-02-05 NOTE — Telephone Encounter (Signed)
Patient would like the nurse or doctor to call patient regarding message he got from [employer] for his oxygen.  He stated that [employer] said they have reached out to the office to confirm that patient needed the oxygen and wanted to get a new script or they would pick up the tanks.  Please advise and call patient to discuss further.  CB# [phone] asap.

## 2023-02-05 NOTE — Telephone Encounter (Signed)
Spoke to a rep at [employer] and she informed me they are supposed to pick up pt's poc and not his home fill system. Somebody put it in their computer system incorrect and the rep is working on correcting the issue. Also I called pt and the pt states he has been taking his poc's out of town with him but not using it for daytime use but for night time use only. I informed him to call [employer] and speak to someone to let them know how he is using his poc's. Pt stated he wanted Dr Jane Canary recommendation. I informed pt I will send Dr Marchelle Gearing a message. I also placed new poc order just in case they need it with a detailed note attached. Pt verbalized understanding.

## 2023-02-18 ENCOUNTER — Other Ambulatory Visit: Payer: Self-pay | Admitting: Cardiovascular Disease

## 2023-02-27 ENCOUNTER — Telehealth: Payer: Self-pay | Admitting: Internal Medicine

## 2023-02-27 ENCOUNTER — Other Ambulatory Visit: Payer: Self-pay

## 2023-02-27 NOTE — Telephone Encounter (Signed)
Called patient regarding July/August and September appointments, patient is notified.

## 2023-03-02 ENCOUNTER — Inpatient Hospital Stay: Payer: Medicare Other | Attending: Internal Medicine

## 2023-03-02 ENCOUNTER — Inpatient Hospital Stay: Payer: Medicare Other | Admitting: Internal Medicine

## 2023-03-02 ENCOUNTER — Inpatient Hospital Stay: Payer: Medicare Other

## 2023-03-02 ENCOUNTER — Other Ambulatory Visit: Payer: Self-pay

## 2023-03-02 DIAGNOSIS — C3412 Malignant neoplasm of upper lobe, left bronchus or lung: Secondary | ICD-10-CM | POA: Insufficient documentation

## 2023-03-02 DIAGNOSIS — Z5112 Encounter for antineoplastic immunotherapy: Secondary | ICD-10-CM | POA: Diagnosis not present

## 2023-03-02 DIAGNOSIS — C3482 Malignant neoplasm of overlapping sites of left bronchus and lung: Secondary | ICD-10-CM

## 2023-03-02 DIAGNOSIS — J910 Malignant pleural effusion: Secondary | ICD-10-CM | POA: Insufficient documentation

## 2023-03-02 LAB — CMP (CANCER CENTER ONLY)
ALT: 10 U/L (ref 0–44)
AST: 17 U/L (ref 15–41)
Albumin: 3.8 g/dL (ref 3.5–5.0)
Alkaline Phosphatase: 59 U/L (ref 38–126)
Anion gap: 5 (ref 5–15)
BUN: 19 mg/dL (ref 8–23)
CO2: 29 mmol/L (ref 22–32)
Calcium: 9.1 mg/dL (ref 8.9–10.3)
Chloride: 107 mmol/L (ref 98–111)
Creatinine: 1 mg/dL (ref 0.61–1.24)
GFR, Estimated: >60 mL/min (ref >60)
Glucose, Bld: 93 mg/dL (ref 70–99)
Potassium: 4 mmol/L (ref 3.5–5.1)
Sodium: 141 mmol/L (ref 135–145)
Total Bilirubin: 0.5 mg/dL (ref 0.3–1.2)
Total Protein: 6.3 g/dL — ABNORMAL LOW (ref 6.5–8.1)

## 2023-03-02 LAB — CBC WITH DIFFERENTIAL (CANCER CENTER ONLY)
Abs Immature Granulocytes: 0.03 10*3/uL (ref 0.00–0.07)
Basophils Absolute: 0 10*3/uL (ref 0.0–0.1)
Basophils Relative: 0 %
Eosinophils Absolute: 0.3 10*3/uL (ref 0.0–0.5)
Eosinophils Relative: 3 %
HCT: 38.7 % — ABNORMAL LOW (ref 39.0–52.0)
Hemoglobin: 12.9 g/dL — ABNORMAL LOW (ref 13.0–17.0)
Immature Granulocytes: 0 %
Lymphocytes Relative: 24 %
Lymphs Abs: 2 10*3/uL (ref 0.7–4.0)
MCH: 30.4 pg (ref 26.0–34.0)
MCHC: 33.3 g/dL (ref 30.0–36.0)
MCV: 91.3 fL (ref 80.0–100.0)
Monocytes Absolute: 1 10*3/uL (ref 0.1–1.0)
Monocytes Relative: 13 %
Neutro Abs: 4.8 10*3/uL (ref 1.7–7.7)
Neutrophils Relative %: 60 %
Platelet Count: 185 10*3/uL (ref 150–400)
RBC: 4.24 MIL/uL (ref 4.22–5.81)
RDW: 13.3 % (ref 11.5–15.5)
WBC Count: 8.2 10*3/uL (ref 4.0–10.5)
nRBC: 0 % (ref 0.0–0.2)

## 2023-03-02 MED ORDER — SODIUM CHLORIDE 0.9 % IV SOLN: Freq: Once | INTRAVENOUS | Status: AC

## 2023-03-02 MED ORDER — SODIUM CHLORIDE 0.9 % IV SOLN
1500.0000 mg | Freq: Once | INTRAVENOUS | Status: AC
  Administered 2023-03-02: 1500 mg via INTRAVENOUS
  Filled 2023-03-02: qty 30

## 2023-03-02 NOTE — Progress Notes (Signed)
[facility] [phone]   [phone]  OFFICE PROGRESS NOTE  Pedro Corn, MD [address] [place] [address]  DIAGNOSIS: Extensive stage (T3, N2, M1b) small cell lung cancer presented with obstructive left upper lobe lung mass in addition to mediastinal lymphadenopathy and malignant left pleural effusion as well as pleural-based metastasis as well as suspicious liver metastasis diagnosed in June 2023.  PRIOR THERAPY: None  CURRENT THERAPY: Palliative systemic chemotherapy with carboplatin initiated for AUC of 4 on day 1 and etoposide 80 Mg/M2 on days 1, 2 and 3 with Cosela before the chemotherapy and Imfinzi 1500 Mg IV on day 1 every 3 weeks.  Status post 15 cycles.  Starting from cycle #5 the patient will be on maintenance treatment with Imfinzi 1500 Mg IV every 4 weeks.   INTERVAL HISTORY: Pedro Pope. 82 y.o. male returns to the clinic today for follow-up visit.  The patient is feeling fine today with no concerning complaints.  He denied having any current chest pain, shortness of breath except with exertion with no cough or hemoptysis.  He has no nausea, vomiting, diarrhea or constipation.  He has no headache or visual changes.  He denied having any significant weight loss or night sweats.  He is here today for evaluation before starting cycle #16 of his treatment.  MEDICAL HISTORY: Past Medical History:  Diagnosis Date   Anosmia    CAD (coronary artery disease), native coronary artery    a. 10/29/2013 antlat STEMI s/p DES to pLAD; residual 80 % proximal and 60% mid LCx diease   Chronic headaches    Dressler's syndrome (HCC)    a. placed on colchicine    ED (erectile dysfunction)    Elevated TSH    Fatty liver    Gout    Ischemic cardiomyopathy    a. 2D ECHO 11/04/14 w/ EF 40-45% with LV WMA, G2DD, mild MR, mild LA dilation, mod TR.   PAF (paroxysmal atrial fibrillation) (HCC)    a. Dx on 11/03/14 admission. Not placed on longterm  AC due to DAPT w/ receent DES. Will plan for outpt heart monitor    Pre-diabetes    a. HgA1c 6.1 in 10/2014   Prostate cancer [facility])    s/p prostatectomy and XRT   Rheumatic fever 1947    ALLERGIES:  is allergic to penicillins, clindamycin, Pope syrup [glucose], and aspirin adult low [aspirin].  MEDICATIONS:  Current Outpatient Medications  Medication Sig Dispense Refill   acetaminophen (TYLENOL) 500 MG tablet Take 500 mg by mouth every 6 (six) hours as needed for mild pain or moderate pain. (Patient not taking: Reported on 11/04/2022)     albuterol (VENTOLIN HFA) 108 (90 Base) MCG/ACT inhaler Inhale 1-2 puffs into the lungs every 6 (six) hours as needed. (Patient not taking: Reported on 11/04/2022) 8 g 2   ascorbic acid (VITAMIN C) 500 MG tablet Take 250 mg by mouth 2 (two) times daily. (Patient not taking: Reported on 01/05/2023)     Blood Pressure Monitor KIT For daily blood pressure monitoring;  Arm Cuff 1 each 0   carvedilol (COREG) 3.125 MG tablet TAKE 1 TABLET(3.125 MG) BY MOUTH DAILY 90 tablet 1   Cholecalciferol (VITAMIN D3) 25 MCG (1000 UT) CAPS Take 1 capsule by mouth daily as needed (gout). (Patient not taking: Reported on 11/10/2022)     escitalopram (LEXAPRO) 5 MG tablet Take 5 mg by mouth daily.     hydrocortisone 1 % lotion  Apply 1 Application topically 2 (two) times daily. 118 mL 0   hydrOXYzine (ATARAX) 10 MG tablet Take 1 tablet (10 mg total) by mouth 3 (three) times daily as needed. (Patient not taking: Reported on 11/10/2022) 30 tablet 0   PROCTO-MED HC 2.5 % rectal cream Apply topically. (Patient not taking: Reported on 11/10/2022)     triamcinolone cream (KENALOG) 0.1 % Apply 1 Application topically 2 (two) times daily. (Patient not taking: Reported on 12/08/2022) 80 g 0   No current facility-administered medications for this visit.   Facility-Administered Medications Ordered in Other Visits  Medication Dose Route Frequency Provider Last Rate Last Admin   sodium chloride  flush (NS) 0.9 % injection 10 mL  10 mL Intracatheter Once Si Gaul, MD        SURGICAL HISTORY:  Past Surgical History:  Procedure Laterality Date   CORONARY ANGIOPLASTY WITH STENT PLACEMENT  10/30/14   STEMI s/p DES to proximal LAD; residual 80 % proximal and 60% mid LCx diease   IR IMAGING GUIDED PORT INSERTION  03/12/2022   IR PATIENT EVAL TECH 0-60 MINS  10/20/2022   IR PATIENT EVAL TECH 0-60 MINS  10/27/2022   IR PATIENT EVAL TECH 0-60 MINS  10/30/2022   IR PATIENT EVAL TECH 0-60 MINS  10/23/2022   IR PATIENT EVAL TECH 0-60 MINS  11/06/2022   IR PATIENT EVAL TECH 0-60 MINS  11/13/2022   IR PATIENT EVAL TECH 0-60 MINS  12/01/2022   IR RADIOLOGIST EVAL & MGMT  10/02/2022   IR RADIOLOGIST EVAL & MGMT  10/13/2022   IR RADIOLOGIST EVAL & MGMT  10/14/2022   IR RADIOLOGIST EVAL & MGMT  10/17/2022   IR RADIOLOGIST EVAL & MGMT  10/20/2022   IR REMOVAL TUN ACCESS W/ PORT W/O FL MOD SED  10/13/2022   LEFT HEART CATHETERIZATION WITH CORONARY ANGIOGRAM N/A 10/30/2014   Procedure: LEFT HEART CATHETERIZATION WITH CORONARY ANGIOGRAM;  Surgeon: Runell Gess, MD;  Location: [facility] CATH LAB;  Service: Cardiovascular;  Laterality: N/A;   PERCUTANEOUS CORONARY STENT INTERVENTION (PCI-S)  10/30/2014   Procedure: PERCUTANEOUS CORONARY STENT INTERVENTION (PCI-S);  Surgeon: Runell Gess, MD;  Location: [facility] CATH LAB;  Service: Cardiovascular;;   PROSTATECTOMY     THORACENTESIS Left 12/31/2021   Procedure: THORACENTESIS;  Surgeon: Lorin Glass, MD;  Location: [facility] ENDOSCOPY;  Service: Pulmonary;  Laterality: Left;   TONSILLECTOMY     VIDEO ASSISTED THORACOSCOPY (VATS)/DECORTICATION Left 01/21/2022   Procedure: VIDEO ASSISTED THORACOSCOPY (VATS)/DECORTICATION;  Surgeon: Corliss Skains, MD;  Location: [facility] OR;  Service: Thoracic;  Laterality: Left;    REVIEW OF SYSTEMS:  A comprehensive review of systems was negative except for: Respiratory: positive for dyspnea on exertion   PHYSICAL EXAMINATION: General  appearance: alert, cooperative, and no distress Head: Normocephalic, without obvious abnormality, atraumatic Neck: no adenopathy, no JVD, supple, symmetrical, trachea midline, and thyroid not enlarged, symmetric, no tenderness/mass/nodules Lymph nodes: Cervical, supraclavicular, and axillary nodes normal. Resp: clear to auscultation bilaterally Back: symmetric, no curvature. ROM normal. No CVA tenderness. Cardio: regular rate and rhythm, S1, S2 normal, no murmur, click, rub or gallop GI: soft, non-tender; bowel sounds normal; no masses,  no organomegaly Extremities: extremities normal, atraumatic, no cyanosis or edema  ECOG PERFORMANCE STATUS: 1 - Symptomatic but completely ambulatory  Blood pressure (!) 118/53, pulse 60, temperature (!) 97.5 F (36.4 C), temperature source Oral, resp. rate 16, height 5\' 8"  (1.727 m), weight 146 lb 9 oz (66.5 kg), SpO2 96%.  LABORATORY  DATA: Lab Results  Component Value Date   WBC 8.2 03/02/2023   HGB 12.9 (L) 03/02/2023   HCT 38.7 (L) 03/02/2023   MCV 91.3 03/02/2023   PLT 185 03/02/2023      Chemistry      Component Value Date/Time   NA 140 02/02/2023 1055   K 4.4 02/02/2023 1055   CL 107 02/02/2023 1055   CO2 30 02/02/2023 1055   BUN 18 02/02/2023 1055   CREATININE 0.99 02/02/2023 1055      Component Value Date/Time   CALCIUM 9.1 02/02/2023 1055   ALKPHOS 65 02/02/2023 1055   AST 17 02/02/2023 1055   ALT 8 02/02/2023 1055   BILITOT 0.4 02/02/2023 1055       RADIOGRAPHIC STUDIES: No results found.  ASSESSMENT AND PLAN: This is a very pleasant 82 years old white male with Extensive stage (T3, N2, M1a) small cell lung cancer presented with obstructive left upper lobe lung mass in addition to mediastinal lymphadenopathy and malignant left pleural effusion as well as pleural-based metastasis and suspicious liver metastasis diagnosed in June 2023. The patient is currently undergoing systemic chemotherapy with carboplatin for AUC of 4 on  day 1, etoposide 80 Mg/M2 on days 1, 2 and 3 with Imfinzi 240 Mg/M2 on the days before chemotherapy and Imfinzi 1500 Mg IV on day 1 every 3 weeks.  Status post 15 cycles.  Starting from cycle #5 he will be on maintenance treatment with immunotherapy with Imfinzi 1500 Mg IV every 4 weeks. The patient continues to tolerate his treatment with maintenance Imfinzi fairly well. I recommended for him to proceed with cycle #16 today as planned. I will see him back for follow-up visit in 4 weeks for evaluation before starting the next cycle of his treatment. We will consider repeating his imaging studies after the next cycle of his treatment. He was advised to call immediately if he has any other concerning symptoms in the interval. The patient voices understanding of current disease status and treatment options and is in agreement with the current care plan.  All questions were answered. The patient knows to call the clinic with any problems, questions or concerns. We can certainly see the patient much sooner if necessary.  The total time spent in the appointment was 20 minutes.  Disclaimer: This note was dictated with voice recognition software. Similar sounding words can inadvertently be transcribed and may not be corrected upon review.

## 2023-03-02 NOTE — Patient Instructions (Signed)

## 2023-03-13 ENCOUNTER — Other Ambulatory Visit: Payer: Self-pay

## 2023-03-21 ENCOUNTER — Other Ambulatory Visit: Payer: Self-pay

## 2023-03-25 NOTE — Progress Notes (Signed)
[facility] OFFICE PROGRESS NOTE  Pedro Corn, MD [address] [place] [address]  DIAGNOSIS: Extensive stage (T3, N2, M1b) small cell lung cancer presented with obstructive left upper lobe lung mass in addition to mediastinal lymphadenopathy and malignant left pleural effusion as well as pleural-based metastasis as well as suspicious liver metastasis diagnosed in June 2023.   PRIOR THERAPY: None   CURRENT THERAPY: Palliative systemic chemotherapy with carboplatin initiated for AUC of 4 on day 1 and etoposide 80 Mg/M2 on days 1, 2 and 3 with Cosela before the chemotherapy and Imfinzi 1500 Mg IV on day 1 every 3 weeks.  Status post 16 cycles.  Starting from cycle #5 the patient will be on maintenance treatment with Imfinzi 1500 Mg IV every 4 weeks.   INTERVAL HISTORY: Pedro Pope. 82 y.o. male returns to the clinic for a follow up visit accompanied by his wife. The patient is feeling well today without any concerning complaints. The patient continues to tolerate treatment with immunotherapy well without any adverse effects except for some rash on his face and neck. He uses hydrocortisone cream. He has an old prescription for kenalog cream but he did not know he can use this. Denies any fever, chills, night sweats, or weight loss. Denies any chest pain, shortness of breath, cough, or hemoptysis. He uses supplemental oxygen at night. Denies any nausea, vomiting, diarrhea, or constipation. Denies any headache or visual changes. A few months ago, he had a brain MRI to evaluate headaches which was negative for any metastatic disease and showed some age related volume loss. He mentions that his short term memory is not as good and started after chemotherapy last year. However, he has been on immunotherapy maintenance for several months now and they are wondering if this is a long term side effect of the chemotherapy. He mentions his memory has not been as good for the last 2 months  or so. He never has had brain radiation. The patient is here today for evaluation prior to starting cycle # 17   MEDICAL HISTORY: Past Medical History:  Diagnosis Date   Anosmia    CAD (coronary artery disease), native coronary artery    a. 10/29/2013 antlat STEMI s/p DES to pLAD; residual 80 % proximal and 60% mid LCx diease   Chronic headaches    Dressler's syndrome (HCC)    a. placed on colchicine    ED (erectile dysfunction)    Elevated TSH    Fatty liver    Gout    Ischemic cardiomyopathy    a. 2D ECHO 11/04/14 w/ EF 40-45% with LV WMA, G2DD, mild MR, mild LA dilation, mod TR.   PAF (paroxysmal atrial fibrillation) (HCC)    a. Dx on 11/03/14 admission. Not placed on longterm AC due to DAPT w/ receent DES. Will plan for outpt heart monitor    Pre-diabetes    a. HgA1c 6.1 in 10/2014   Prostate cancer [facility])    s/p prostatectomy and XRT   Rheumatic fever 1947    ALLERGIES:  is allergic to penicillins, clindamycin, Pope syrup [glucose], and aspirin adult low [aspirin].  MEDICATIONS:  Current Outpatient Medications  Medication Sig Dispense Refill   acetaminophen (TYLENOL) 500 MG tablet Take 500 mg by mouth every 6 (six) hours as needed for mild pain or moderate pain. (Patient not taking: Reported on 11/04/2022)     albuterol (VENTOLIN HFA) 108 (90 Base) MCG/ACT inhaler Inhale 1-2 puffs into the lungs every 6 (six)  hours as needed. (Patient not taking: Reported on 11/04/2022) 8 g 2   ascorbic acid (VITAMIN C) 500 MG tablet Take 250 mg by mouth 2 (two) times daily. (Patient not taking: Reported on 01/05/2023)     Blood Pressure Monitor KIT For daily blood pressure monitoring;  Arm Cuff 1 each 0   carvedilol (COREG) 3.125 MG tablet TAKE 1 TABLET(3.125 MG) BY MOUTH DAILY 90 tablet 1   Cholecalciferol (VITAMIN D3) 25 MCG (1000 UT) CAPS Take 1 capsule by mouth daily as needed (gout). (Patient not taking: Reported on 11/10/2022)     escitalopram (LEXAPRO) 5 MG tablet Take 5 mg by mouth daily.      hydrocortisone 1 % lotion Apply 1 Application topically 2 (two) times daily. 118 mL 0   hydrOXYzine (ATARAX) 10 MG tablet Take 1 tablet (10 mg total) by mouth 3 (three) times daily as needed. (Patient not taking: Reported on 11/10/2022) 30 tablet 0   PROCTO-MED HC 2.5 % rectal cream Apply topically. (Patient not taking: Reported on 11/10/2022)     triamcinolone cream (KENALOG) 0.1 % Apply 1 Application topically 2 (two) times daily. (Patient not taking: Reported on 12/08/2022) 80 g 0   No current facility-administered medications for this visit.   Facility-Administered Medications Ordered in Other Visits  Medication Dose Route Frequency Provider Last Rate Last Admin   sodium chloride flush (NS) 0.9 % injection 10 mL  10 mL Intracatheter Once Si Gaul, MD        SURGICAL HISTORY:  Past Surgical History:  Procedure Laterality Date   CORONARY ANGIOPLASTY WITH STENT PLACEMENT  10/30/14   STEMI s/p DES to proximal LAD; residual 80 % proximal and 60% mid LCx diease   IR IMAGING GUIDED PORT INSERTION  03/12/2022   IR PATIENT EVAL TECH 0-60 MINS  10/20/2022   IR PATIENT EVAL TECH 0-60 MINS  10/27/2022   IR PATIENT EVAL TECH 0-60 MINS  10/30/2022   IR PATIENT EVAL TECH 0-60 MINS  10/23/2022   IR PATIENT EVAL TECH 0-60 MINS  11/06/2022   IR PATIENT EVAL TECH 0-60 MINS  11/13/2022   IR PATIENT EVAL TECH 0-60 MINS  12/01/2022   IR RADIOLOGIST EVAL & MGMT  10/02/2022   IR RADIOLOGIST EVAL & MGMT  10/13/2022   IR RADIOLOGIST EVAL & MGMT  10/14/2022   IR RADIOLOGIST EVAL & MGMT  10/17/2022   IR RADIOLOGIST EVAL & MGMT  10/20/2022   IR REMOVAL TUN ACCESS W/ PORT W/O FL MOD SED  10/13/2022   LEFT HEART CATHETERIZATION WITH CORONARY ANGIOGRAM N/A 10/30/2014   Procedure: LEFT HEART CATHETERIZATION WITH CORONARY ANGIOGRAM;  Surgeon: Runell Gess, MD;  Location: [facility] CATH LAB;  Service: Cardiovascular;  Laterality: N/A;   PERCUTANEOUS CORONARY STENT INTERVENTION (PCI-S)  10/30/2014   Procedure: PERCUTANEOUS  CORONARY STENT INTERVENTION (PCI-S);  Surgeon: Runell Gess, MD;  Location: [facility] CATH LAB;  Service: Cardiovascular;;   PROSTATECTOMY     THORACENTESIS Left 12/31/2021   Procedure: THORACENTESIS;  Surgeon: Lorin Glass, MD;  Location: [facility] ENDOSCOPY;  Service: Pulmonary;  Laterality: Left;   TONSILLECTOMY     VIDEO ASSISTED THORACOSCOPY (VATS)/DECORTICATION Left 01/21/2022   Procedure: VIDEO ASSISTED THORACOSCOPY (VATS)/DECORTICATION;  Surgeon: Corliss Skains, MD;  Location: [facility] OR;  Service: Thoracic;  Laterality: Left;    REVIEW OF SYSTEMS:   Review of Systems  Constitutional: Positive for stable fatigue. Negative for appetite change, chills,  fever and unexpected weight change.  HENT: Negative for mouth sores, nosebleeds,  sore throat and trouble swallowing.   Eyes: Negative for eye problems and icterus.  Respiratory: Negative for cough, hemoptysis, shortness of breath and wheezing.   Cardiovascular: Negative for chest pain and leg swelling.  Gastrointestinal: Negative for abdominal pain, constipation, diarrhea, nausea and vomiting.  Genitourinary: Negative for bladder incontinence, difficulty urinating, dysuria, frequency and hematuria.   Musculoskeletal: Negative for back pain, gait problem, neck pain and neck stiffness.  Skin: Positive for rash on right forehead and right neck.  Neurological: Negative for dizziness, extremity weakness, gait problem, headaches, light-headedness and seizures.  Hematological: Negative for adenopathy. Does not bruise/bleed easily.  Psychiatric/Behavioral: Positive for short term memory loss. Negative for confusion, depression and sleep disturbance. The patient is not nervous/anxious.     PHYSICAL EXAMINATION:  Blood pressure (!) 122/98, pulse (!) 54, temperature 97.9 F (36.6 C), temperature source Oral, resp. rate 16, weight 148 lb 12.8 oz (67.5 kg), SpO2 97%.  ECOG PERFORMANCE STATUS: 1  Physical Exam  Constitutional: Oriented to person,  place, and time and thin appearing male, and in no distress.  HENT:  Head: Normocephalic and atraumatic.  Mouth/Throat: Oropharynx is clear and moist. No oropharyngeal exudate.  Eyes: Conjunctivae are normal. Right eye exhibits no discharge. Left eye exhibits no discharge. No scleral icterus.  Neck: Normal range of motion. Neck supple.  Cardiovascular: Normal rate, regular rhythm, normal heart sounds and intact distal pulses.   Pulmonary/Chest: Effort normal. Decreased breath sounds in base of the left lung. No respiratory distress. No wheezes. No rales.  Abdominal:  Exhibits no distension  Musculoskeletal: Normal range of motion. Exhibits no edema.  Lymphadenopathy:    No cervical adenopathy.  Neurological: Alert and oriented to person, place, and time. Exhibits muscle wasting Coordination normal.  Skin: Skin is warm and dry. Rash on right forehead and neck. Not diaphoretic. No erythema. No pallor.  Psychiatric: Mood, memory and judgment normal.  Vitals reviewed.  LABORATORY DATA: Lab Results  Component Value Date   WBC 8.0 03/30/2023   HGB 12.8 (L) 03/30/2023   HCT 37.4 (L) 03/30/2023   MCV 90.6 03/30/2023   PLT 178 03/30/2023      Chemistry      Component Value Date/Time   NA 142 03/30/2023 1003   K 3.9 03/30/2023 1003   CL 109 03/30/2023 1003   CO2 29 03/30/2023 1003   BUN 18 03/30/2023 1003   CREATININE 0.94 03/30/2023 1003      Component Value Date/Time   CALCIUM 8.7 (L) 03/30/2023 1003   ALKPHOS 61 03/30/2023 1003   AST 17 03/30/2023 1003   ALT 9 03/30/2023 1003   BILITOT 0.5 03/30/2023 1003       RADIOGRAPHIC STUDIES:  No results found.   ASSESSMENT/PLAN:  This is a very pleasant 82 year old Caucasian male diagnosed with extensive stage (T3, N2, M1 a) small cell lung cancer.  He presented with an obstructive left upper lobe lung mass in addition to mediastinal lymphadenopathy.  He also has a malignant left pleural effusion and pleural-based metastases.  He  also has a suspicious liver metastasis.  He was diagnosed in June 2023.   The patient is currently undergoing slightly reduced dose systemic chemotherapy with carboplatin for an AUC of 4 on day 1, etoposide 80 mg per metered squared on days 1 2, and 3 with Imfinzi on day 1 every 3 weeks.  He is status post 16 cycles.  Starting from cycle #5, the patient started maintenance immunotherapy with Imfinzi IV every 4 weeks.  Labs were reviewed. Recommend he proceed with cycle #17 today as scheduled.   I will arrange for a restaging CT scan prior to his next infusion.   We will see him in 4 weeks for evaluation and repeat blood work before undergoing cycle #18.   For his rash, this could be secondary to his immunotherapy.  I let him know that he can try using the Kenalog cream which typically most patients report better success than hydrocortisone cream.  I also advised him to use a antihistamine such as Claritin or Zyrtec.  Regarding his memory, I think this would be unusual to be residual from his chemotherapy which has been completed since August 2023.  His last brain MRI from April 2024 showed some age-related volume loss.  I recommended a repeat brain MRI to rule out metastatic disease to the brain given he has small cell lung cancer and immunotherapy does not offer a lot of protection for metastatic disease to the brain.  If this is negative, then I would recommend seeing his family doctor for dementia workup.  I can recheck his B12 at his next appointment.  The patient is wife would like to hold off on a brain MRI at this time since he had one recently.  We reviewed signs and symptoms that would certainly warrant repeat neuroevaluation such as headaches, balance changes, speech changes, extremity weakness, confusion, etc.  They also asked that I talk to Dr. Arbutus Ped to get his opinion.   The patient also does not have a Port-A-Cath.  This was ultimately moved several months ago.  I will reach out to  scheduling to change all his lab and flush appointments to regular lab visits.  The patient was advised to call immediately if he has any concerning symptoms in the interval. The patient voices understanding of current disease status and treatment options and is in agreement with the current care plan. All questions were answered. The patient knows to call the clinic with any problems, questions or concerns. We can certainly see the patient much sooner if necessary     Orders Placed This Encounter  Procedures   CT CHEST ABDOMEN PELVIS W CONTRAST    Standing Status:   Future    Standing Expiration Date:   03/29/2024    Order Specific Question:   If indicated for the ordered procedure, I authorize the administration of contrast media per Radiology protocol    Answer:   Yes    Order Specific Question:   Does the patient have a contrast media/X-ray dye allergy?    Answer:   No    Order Specific Question:   Preferred imaging location?    Answer:   [facility]    Order Specific Question:   If indicated for the ordered procedure, I authorize the administration of oral contrast media per Radiology protocol    Answer:   Yes     The total time spent in the appointment was 20-29 minutes   L , PA-C 03/30/23

## 2023-03-30 ENCOUNTER — Other Ambulatory Visit: Payer: Self-pay

## 2023-03-30 ENCOUNTER — Inpatient Hospital Stay: Payer: Medicare Other

## 2023-03-30 ENCOUNTER — Inpatient Hospital Stay (HOSPITAL_BASED_OUTPATIENT_CLINIC_OR_DEPARTMENT_OTHER): Payer: Medicare Other | Admitting: Physician Assistant

## 2023-03-30 ENCOUNTER — Other Ambulatory Visit: Payer: Self-pay | Admitting: Physician Assistant

## 2023-03-30 ENCOUNTER — Inpatient Hospital Stay: Payer: Medicare Other | Attending: Internal Medicine

## 2023-03-30 VITALS — BP 116/50 | HR 51 | Resp 16

## 2023-03-30 VITALS — BP 122/98 | HR 54 | Temp 97.9°F | Resp 16 | Wt 148.8 lb

## 2023-03-30 DIAGNOSIS — R21 Rash and other nonspecific skin eruption: Secondary | ICD-10-CM | POA: Diagnosis not present

## 2023-03-30 DIAGNOSIS — Z5112 Encounter for antineoplastic immunotherapy: Secondary | ICD-10-CM | POA: Insufficient documentation

## 2023-03-30 DIAGNOSIS — C787 Secondary malignant neoplasm of liver and intrahepatic bile duct: Secondary | ICD-10-CM | POA: Insufficient documentation

## 2023-03-30 DIAGNOSIS — C3482 Malignant neoplasm of overlapping sites of left bronchus and lung: Secondary | ICD-10-CM

## 2023-03-30 DIAGNOSIS — R413 Other amnesia: Secondary | ICD-10-CM

## 2023-03-30 DIAGNOSIS — J910 Malignant pleural effusion: Secondary | ICD-10-CM | POA: Insufficient documentation

## 2023-03-30 DIAGNOSIS — C3412 Malignant neoplasm of upper lobe, left bronchus or lung: Secondary | ICD-10-CM | POA: Diagnosis not present

## 2023-03-30 LAB — CMP (CANCER CENTER ONLY)
ALT: 9 U/L (ref 0–44)
AST: 17 U/L (ref 15–41)
Albumin: 3.7 g/dL (ref 3.5–5.0)
Alkaline Phosphatase: 61 U/L (ref 38–126)
Anion gap: 4 — ABNORMAL LOW (ref 5–15)
BUN: 18 mg/dL (ref 8–23)
CO2: 29 mmol/L (ref 22–32)
Calcium: 8.7 mg/dL — ABNORMAL LOW (ref 8.9–10.3)
Chloride: 109 mmol/L (ref 98–111)
Creatinine: 0.94 mg/dL (ref 0.61–1.24)
GFR, Estimated: >60 mL/min (ref >60)
Glucose, Bld: 100 mg/dL — ABNORMAL HIGH (ref 70–99)
Potassium: 3.9 mmol/L (ref 3.5–5.1)
Sodium: 142 mmol/L (ref 135–145)
Total Bilirubin: 0.5 mg/dL (ref 0.3–1.2)
Total Protein: 6.1 g/dL — ABNORMAL LOW (ref 6.5–8.1)

## 2023-03-30 LAB — CBC WITH DIFFERENTIAL (CANCER CENTER ONLY)
Abs Immature Granulocytes: 0.02 10*3/uL (ref 0.00–0.07)
Basophils Absolute: 0 10*3/uL (ref 0.0–0.1)
Basophils Relative: 0 %
Eosinophils Absolute: 0.3 10*3/uL (ref 0.0–0.5)
Eosinophils Relative: 4 %
HCT: 37.4 % — ABNORMAL LOW (ref 39.0–52.0)
Hemoglobin: 12.8 g/dL — ABNORMAL LOW (ref 13.0–17.0)
Immature Granulocytes: 0 %
Lymphocytes Relative: 20 %
Lymphs Abs: 1.6 10*3/uL (ref 0.7–4.0)
MCH: 31 pg (ref 26.0–34.0)
MCHC: 34.2 g/dL (ref 30.0–36.0)
MCV: 90.6 fL (ref 80.0–100.0)
Monocytes Absolute: 0.9 10*3/uL (ref 0.1–1.0)
Monocytes Relative: 11 %
Neutro Abs: 5.1 10*3/uL (ref 1.7–7.7)
Neutrophils Relative %: 65 %
Platelet Count: 178 10*3/uL (ref 150–400)
RBC: 4.13 MIL/uL — ABNORMAL LOW (ref 4.22–5.81)
RDW: 13.5 % (ref 11.5–15.5)
WBC Count: 8 10*3/uL (ref 4.0–10.5)
nRBC: 0 % (ref 0.0–0.2)

## 2023-03-30 MED ORDER — SODIUM CHLORIDE 0.9 % IV SOLN
1500.0000 mg | Freq: Once | INTRAVENOUS | Status: AC
  Administered 2023-03-30: 1500 mg via INTRAVENOUS
  Filled 2023-03-30: qty 30

## 2023-03-30 MED ORDER — SODIUM CHLORIDE 0.9 % IV SOLN: Freq: Once | INTRAVENOUS | Status: AC

## 2023-03-30 NOTE — Patient Instructions (Signed)

## 2023-04-06 ENCOUNTER — Ambulatory Visit (HOSPITAL_COMMUNITY)
Admission: RE | Admit: 2023-04-06 | Discharge: 2023-04-06 | Disposition: A | Discharge status: Home / Self Care | Payer: Medicare Other | Source: Ambulatory Visit | Attending: Physician Assistant | Admitting: Physician Assistant

## 2023-04-06 DIAGNOSIS — C3482 Malignant neoplasm of overlapping sites of left bronchus and lung: Secondary | ICD-10-CM | POA: Diagnosis not present

## 2023-04-06 DIAGNOSIS — R413 Other amnesia: Secondary | ICD-10-CM | POA: Insufficient documentation

## 2023-04-06 DIAGNOSIS — C3490 Malignant neoplasm of unspecified part of unspecified bronchus or lung: Secondary | ICD-10-CM | POA: Diagnosis not present

## 2023-04-06 MED ORDER — GADOBUTROL 1 MMOL/ML IV SOLN
6.0000 mL | Freq: Once | INTRAVENOUS | Status: AC | PRN
  Administered 2023-04-06: 6 mL via INTRAVENOUS

## 2023-04-21 ENCOUNTER — Ambulatory Visit (HOSPITAL_COMMUNITY)
Admission: RE | Admit: 2023-04-21 | Discharge: 2023-04-21 | Disposition: A | Discharge status: Home / Self Care | Payer: Medicare Other | Source: Ambulatory Visit | Attending: Physician Assistant | Admitting: Physician Assistant

## 2023-04-21 DIAGNOSIS — K5730 Diverticulosis of large intestine without perforation or abscess without bleeding: Secondary | ICD-10-CM | POA: Diagnosis not present

## 2023-04-21 DIAGNOSIS — N200 Calculus of kidney: Secondary | ICD-10-CM | POA: Diagnosis not present

## 2023-04-21 DIAGNOSIS — C3482 Malignant neoplasm of overlapping sites of left bronchus and lung: Secondary | ICD-10-CM | POA: Diagnosis not present

## 2023-04-21 DIAGNOSIS — I2510 Atherosclerotic heart disease of native coronary artery without angina pectoris: Secondary | ICD-10-CM | POA: Diagnosis not present

## 2023-04-21 DIAGNOSIS — K429 Umbilical hernia without obstruction or gangrene: Secondary | ICD-10-CM | POA: Diagnosis not present

## 2023-04-21 MED ORDER — IOHEXOL 300 MG/ML  SOLN
100.0000 mL | Freq: Once | INTRAMUSCULAR | Status: AC | PRN
  Administered 2023-04-21: 100 mL via INTRAVENOUS

## 2023-04-23 NOTE — Progress Notes (Signed)
[facility] OFFICE PROGRESS NOTE  [name], MD [address] [place] [address]  DIAGNOSIS:  Extensive stage (T3, N2, M1b) small cell lung cancer presented with obstructive left upper lobe lung mass in addition to mediastinal lymphadenopathy and malignant left pleural effusion as well as pleural-based metastasis as well as suspicious liver metastasis diagnosed in June 2023.   PRIOR THERAPY: None  CURRENT THERAPY: Palliative systemic chemotherapy with carboplatin initiated for AUC of 4 on day 1 and etoposide 80 Mg/M2 on days 1, 2 and 3 with Cosela before the chemotherapy and Imfinzi 1500 Mg IV on day 1 every 3 weeks.  Status post 17 cycles.  Starting from cycle #5 the patient will be on maintenance treatment with Imfinzi 1500 Mg IV every 4 weeks.   INTERVAL HISTORY: Pedro Pope. 82 y.o. male returns to the clinic today for a follow-up visit accompanied by his wife.  The patient was last seen by myself 4 weeks ago.  He is feeling well today without any concerning complaints.  At his last appointment, he was endorsing some rash on his face and neck. He started using Kenalog cream which has helped and the rash is almost gone.  He also was mentioning that he has been having some memory changes since starting chemotherapy.  We arrange for brain MRI to rule out any metastatic disease to the brain which was negative for metastatic disease.  We previously recommended that if this is negative, to follow-up with his PCP.  Otherwise he denies any major changes in his health since he was last seen.  He denies any fever, chills, night sweats, or unexplained weight loss.  He mentions he sometimes gets a sharp/dull chest discomfort that lasts a only a few seconds before resolving spontaneously. He is wondering if this is from his lung cancer. Denies any shortness of breath, cough, or hemoptysis.  He uses supplemental oxygen at night if needed. Denies any nausea, vomiting, diarrhea, or  constipation.  Denies any headache or visual changes.  He is here today for evaluation and to review his restaging CT scan results before starting cycle #18.   MEDICAL HISTORY: Past Medical History:  Diagnosis Date   Anosmia    CAD (coronary artery disease), native coronary artery    a. 10/29/2013 antlat STEMI s/p DES to pLAD; residual 80 % proximal and 60% mid LCx diease   Chronic headaches    Dressler's syndrome (HCC)    a. placed on colchicine    ED (erectile dysfunction)    Elevated TSH    Fatty liver    Gout    Ischemic cardiomyopathy    a. 2D ECHO 11/04/14 w/ EF 40-45% with LV WMA, G2DD, mild MR, mild LA dilation, mod TR.   PAF (paroxysmal atrial fibrillation) (HCC)    a. Dx on 11/03/14 admission. Not placed on longterm AC due to DAPT w/ receent DES. Will plan for outpt heart monitor    Pre-diabetes    a. HgA1c 6.1 in 10/2014   Prostate cancer [facility])    s/p prostatectomy and XRT   Rheumatic fever 1947    ALLERGIES:  is allergic to penicillins, clindamycin, corn syrup [glucose], and aspirin adult low [aspirin].  MEDICATIONS:  Current Outpatient Medications  Medication Sig Dispense Refill   acetaminophen (TYLENOL) 500 MG tablet Take 500 mg by mouth every 6 (six) hours as needed for mild pain or moderate pain.     albuterol (VENTOLIN HFA) 108 (90 Base) MCG/ACT inhaler Inhale 1-2  puffs into the lungs every 6 (six) hours as needed. 8 g 2   ascorbic acid (VITAMIN C) 500 MG tablet Take 250 mg by mouth 2 (two) times daily.     Blood Pressure Monitor KIT For daily blood pressure monitoring;  Arm Cuff 1 each 0   carvedilol (COREG) 3.125 MG tablet TAKE 1 TABLET(3.125 MG) BY MOUTH DAILY 90 tablet 1   escitalopram (LEXAPRO) 5 MG tablet Take 5 mg by mouth daily.     hydrocortisone 1 % lotion Apply 1 Application topically 2 (two) times daily. 118 mL 0   hydrOXYzine (ATARAX) 10 MG tablet Take 1 tablet (10 mg total) by mouth 3 (three) times daily as needed. 30 tablet 0   PROCTO-MED HC 2.5 %  rectal cream Apply topically.     triamcinolone cream (KENALOG) 0.1 % Apply 1 Application topically 2 (two) times daily. 80 g 0   Cholecalciferol (VITAMIN D3) 25 MCG (1000 UT) CAPS Take 1 capsule by mouth daily as needed (gout). (Patient not taking: Reported on 11/10/2022)     No current facility-administered medications for this visit.   Facility-Administered Medications Ordered in Other Visits  Medication Dose Route Frequency Provider Last Rate Last Admin   sodium chloride flush (NS) 0.9 % injection 10 mL  10 mL Intracatheter Once Si Gaul, MD        SURGICAL HISTORY:  Past Surgical History:  Procedure Laterality Date   CORONARY ANGIOPLASTY WITH STENT PLACEMENT  10/30/14   STEMI s/p DES to proximal LAD; residual 80 % proximal and 60% mid LCx diease   IR IMAGING GUIDED PORT INSERTION  03/12/2022   IR PATIENT EVAL TECH 0-60 MINS  10/20/2022   IR PATIENT EVAL TECH 0-60 MINS  10/27/2022   IR PATIENT EVAL TECH 0-60 MINS  10/30/2022   IR PATIENT EVAL TECH 0-60 MINS  10/23/2022   IR PATIENT EVAL TECH 0-60 MINS  11/06/2022   IR PATIENT EVAL TECH 0-60 MINS  11/13/2022   IR PATIENT EVAL TECH 0-60 MINS  12/01/2022   IR RADIOLOGIST EVAL & MGMT  10/02/2022   IR RADIOLOGIST EVAL & MGMT  10/13/2022   IR RADIOLOGIST EVAL & MGMT  10/14/2022   IR RADIOLOGIST EVAL & MGMT  10/17/2022   IR RADIOLOGIST EVAL & MGMT  10/20/2022   IR REMOVAL TUN ACCESS W/ PORT W/O FL MOD SED  10/13/2022   LEFT HEART CATHETERIZATION WITH CORONARY ANGIOGRAM N/A 10/30/2014   Procedure: LEFT HEART CATHETERIZATION WITH CORONARY ANGIOGRAM;  Surgeon: Runell Gess, MD;  Location: [facility] CATH LAB;  Service: Cardiovascular;  Laterality: N/A;   PERCUTANEOUS CORONARY STENT INTERVENTION (PCI-S)  10/30/2014   Procedure: PERCUTANEOUS CORONARY STENT INTERVENTION (PCI-S);  Surgeon: Runell Gess, MD;  Location: [facility] CATH LAB;  Service: Cardiovascular;;   PROSTATECTOMY     THORACENTESIS Left 12/31/2021   Procedure: THORACENTESIS;  Surgeon: Lorin Glass, MD;  Location: [facility] ENDOSCOPY;  Service: Pulmonary;  Laterality: Left;   TONSILLECTOMY     VIDEO ASSISTED THORACOSCOPY (VATS)/DECORTICATION Left 01/21/2022   Procedure: VIDEO ASSISTED THORACOSCOPY (VATS)/DECORTICATION;  Surgeon: Corliss Skains, MD;  Location: [facility] OR;  Service: Thoracic;  Laterality: Left;    REVIEW OF SYSTEMS:   Constitutional: Positive for stable fatigue. Negative for appetite change, chills,  fever and unexpected weight change.  HENT: Negative for mouth sores, nosebleeds, sore throat and trouble swallowing.   Eyes: Negative for eye problems and icterus.  Respiratory: Negative for cough, hemoptysis, shortness of breath and wheezing.  Cardiovascular: Negative for chest pain and leg swelling.  Gastrointestinal: Negative for abdominal pain, constipation, diarrhea, nausea and vomiting.  Genitourinary: Negative for bladder incontinence, difficulty urinating, dysuria, frequency and hematuria.   Musculoskeletal: Negative for back pain, gait problem, neck pain and neck stiffness.  Skin: Positive for improved rash on right forehead and right neck.  Neurological: Negative for dizziness, extremity weakness, gait problem, headaches, light-headedness and seizures.  Hematological: Negative for adenopathy. Does not bruise/bleed easily.  Psychiatric/Behavioral:  Negative for confusion, depression and sleep disturbance. The patient is not nervous/anxious.   PHYSICAL EXAMINATION:  Blood pressure (!) 103/52, pulse (!) 55, temperature (!) 97.5 F (36.4 C), temperature source Oral, resp. rate 16, weight 147 lb 1 oz (66.7 kg), SpO2 97%.  ECOG PERFORMANCE STATUS: 1  Physical Exam  Constitutional: Oriented to person, place, and time and thin appearing male, and in no distress.  HENT:  Head: Normocephalic and atraumatic.  Mouth/Throat: Oropharynx is clear and moist. No oropharyngeal exudate.  Eyes: Conjunctivae are normal. Right eye exhibits no discharge. Left eye exhibits no  discharge. No scleral icterus.  Neck: Normal range of motion. Neck supple.  Cardiovascular: Normal rate, regular rhythm, normal heart sounds and intact distal pulses.   Pulmonary/Chest: Effort normal. Decreased breath sounds in base of the left lung. No respiratory distress. No wheezes. No rales.  Abdominal:  Exhibits no distension  Musculoskeletal: Normal range of motion. Exhibits no edema.  Lymphadenopathy:    No cervical adenopathy.  Neurological: Alert and oriented to person, place, and time. Exhibits muscle wasting Coordination normal.  Skin: Skin is warm and dry. Improved rash on right forehead and neck. Not diaphoretic. No erythema. No pallor.  Psychiatric: Mood, memory and judgment normal.  Vitals reviewed.  LABORATORY DATA: Lab Results  Component Value Date   WBC 7.7 04/27/2023   HGB 12.7 (L) 04/27/2023   HCT 39.4 04/27/2023   MCV 93.1 04/27/2023   PLT 192 04/27/2023      Chemistry      Component Value Date/Time   NA 141 04/27/2023 1055   K 4.6 04/27/2023 1055   CL 108 04/27/2023 1055   CO2 30 04/27/2023 1055   BUN 22 04/27/2023 1055   CREATININE 1.04 04/27/2023 1055      Component Value Date/Time   CALCIUM 9.3 04/27/2023 1055   ALKPHOS 65 04/27/2023 1055   AST 17 04/27/2023 1055   ALT 8 04/27/2023 1055   BILITOT 0.5 04/27/2023 1055       RADIOGRAPHIC STUDIES:  CT CHEST ABDOMEN PELVIS W CONTRAST  Result Date: 04/24/2023 CLINICAL DATA:  Lung cancer restaging * Tracking Code: BO * EXAM: CT CHEST, ABDOMEN, AND PELVIS WITH CONTRAST TECHNIQUE: Multidetector CT imaging of the chest, abdomen and pelvis was performed following the standard protocol during bolus administration of intravenous contrast. RADIATION DOSE REDUCTION: This exam was performed according to the departmental dose-optimization program which includes automated exposure control, adjustment of the mA and/or kV according to patient size and/or use of iterative reconstruction technique. CONTRAST:   OMNIPAQUE IOHEXOL 300 MG/ML  SOLN COMPARISON:  01/29/2023 FINDINGS: CT CHEST FINDINGS Cardiovascular: Aortic atherosclerosis. Normal heart size. Three-vessel coronary artery calcifications. No pericardial effusion. Mediastinum/Nodes: No enlarged mediastinal, hilar, or axillary lymph nodes. Thyroid gland, trachea, and esophagus demonstrate no significant findings. Lungs/Pleura: Severe emphysema. Mild diffuse bilateral bronchial wall thickening. Moderate underlying pulmonary fibrosis with a bibasilar predominant distribution featuring traction bronchiectasis, subpleural bronchiolectasis, and honeycombing. Unchanged subpleural nodule of the inferior lingula measuring 0.6 cm (series 4, image  134). No pleural effusion or pneumothorax. Musculoskeletal: No chest wall abnormality. No acute osseous findings. CT ABDOMEN PELVIS FINDINGS Hepatobiliary: No focal liver abnormality is seen. Contracted gallbladder. No gallbladder wall thickening or biliary dilatation. Pancreas: Unremarkable. No pancreatic ductal dilatation or surrounding inflammatory changes. Spleen: Normal in size without significant abnormality. Adrenals/Urinary Tract: Adrenal glands are unremarkable. Nonobstructive right renal calculus. No left-sided calculi, ureteral calculi, or hydronephrosis. Bladder is unremarkable. Stomach/Bowel: Stomach is within normal limits. Appendix appears normal. No evidence of bowel wall thickening, distention, or inflammatory changes. Sigmoid diverticulosis. Vascular/Lymphatic: Severe aortic atherosclerosis. 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 Unchanged prominent gastrohepatic ligament lymph node measuring 1.0 x 0.8 cm (series 2, image 60) Reproductive: Status post prostatectomy. Other: Fat containing umbilical hernia.  No ascites. Musculoskeletal: No acute osseous findings. IMPRESSION: 1. No definite evidence of recurrent or metastatic disease in the chest, abdomen, or  pelvis. 2. Unchanged subpleural nodule of the inferior lingula measuring 0.6 cm. Continued attention on follow-up. 3. Severe emphysema with moderate underlying UIP pattern pulmonary fibrosis. 4. Coronary artery disease. 5. Unchanged prominent gastrohepatic ligament lymph node. No other enlarged lymph nodes in the chest, abdomen, or pelvis. Attention on follow-up. 6. Nonobstructive right nephrolithiasis. 7. Status post prostatectomy. 8. 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 Aortic Atherosclerosis (ICD10-I70.0) and Emphysema (ICD10-J43.9). Electronically Signed   By: Jearld Lesch M.D.   On: 04/24/2023 11:59   MR BRAIN W WO CONTRAST  Result Date: 04/22/2023 CLINICAL DATA:  Small-cell lung cancer, metastatic disease EXAM: MRI HEAD WITHOUT AND WITH CONTRAST TECHNIQUE: Multiplanar, multiecho pulse sequences of the brain and surrounding structures were obtained without and with intravenous contrast. CONTRAST:  6mL GADAVIST GADOBUTROL 1 MMOL/ML IV SOLN COMPARISON:  Brain MRI 12/03/2022 FINDINGS: Brain: There is no evidence of acute intracranial hemorrhage, extra-axial fluid collection, or acute infarct Parenchymal volume is stable. The ventricles are stable in size. Parenchymal signal is essentially normal, with no significant burden of underlying white matter disease The pituitary and suprasellar region are normal. There is no mass lesion or abnormal enhancement. There is no mass effect or midline shift. Vascular: Normal flow voids. Skull and upper cervical spine: Normal marrow signal. Sinuses/Orbits: Moderate mucosal thickening in the right frontal sinus is unchanged. Bilateral lens implants are in place. The globes and orbits are otherwise unremarkable. Other: The mastoid air cells and middle ear cavities are clear. IMPRESSION: No evidence of intracranial metastatic disease. Electronically Signed   By: Lesia Hausen M.D.   On: 04/22/2023 14:01      ASSESSMENT/PLAN:  This is a very pleasant 82 year old Caucasian male diagnosed with extensive stage (T3, N2, M1 a) small cell lung cancer.  He presented with an obstructive left upper lobe lung mass in addition to mediastinal lymphadenopathy.  He also has a malignant left pleural effusion and pleural-based metastases.  He also has a suspicious liver metastasis.  He was diagnosed in June 2023.    The patient is currently undergoing slightly reduced dose systemic chemotherapy with carboplatin for an AUC of 4 on day 1, etoposide 80 mg per metered squared on days 1 2, and 3 with Imfinzi on day 1 every 3 weeks.  He is status post 17 cycles.  Starting from cycle #5, the patient started maintenance immunotherapy with Imfinzi IV every 4 weeks.  The patient was seen with Dr. Arbutus Ped today.  Dr. Arbutus Ped personally and independently reviewed the scan and discussed results  with the patient today.  The scan showed no evidence of disease .  Dr. Arbutus Ped recommends he continue with his current treatment at the same dose.     Labs were reviewed. Recommend he proceed with cycle #18 today as scheduled.   We will see him back in 4 weeks for evaluation repeat blood work before starting cycle #19.  Will continue using Kenalog cream if needed for his rash.  Given his small cell lung cancer, Dr. Arbutus Ped discussed that he would recommend surveillance brain MRIs.  His most recent brain MRI was negative for any metastatic disease to the brain.  Regarding his memory concerns, he will follow-up with his PCP.  The patient mentions that he occasionally gets a focal sharp chest discomfort lasting a few seconds before resolving spontaneously.  This is not similar to his prior myocardial infarction symptoms.  The patient knows what symptoms to watch for for concern for heart attack.  No orders of the defined types were placed in this encounter.    Vaishnav Demartin L Sun Wilensky, PA-C 04/27/23  ADDENDUM: Hematology/Oncology  Attending: I had a face-to-face encounter with the patient today.  I reviewed his record, lab, scan and recommended his care plan.  This is a very pleasant 82 years old white male with extensive stage small cell lung cancer diagnosed in June 2023.  He is status post a course of palliative systemic chemotherapy with induction carboplatin, etoposide and Imfinzi for 4 cycles followed by maintenance treatment with single agent Imfinzi status post 13 more cycles.  The patient has been tolerating this treatment well with no concerning adverse effects. He had repeat CT scan of the chest, abdomen and pelvis performed recently.  I personally and independently reviewed the scan and discussed the result with the patient and his wife. His scan showed no concerning findings for disease progression. I recommended for him to continue his current treatment with Imfinzi and he will proceed with cycle #18 today. He will come back for follow-up visit in 4 weeks for evaluation before the next cycle of his treatment. The patient was advised to call immediately if he has any other concerning symptoms in the interval. The total time spent in the appointment was 30 minutes. Disclaimer: This note was dictated with voice recognition software. Similar sounding words can inadvertently be transcribed and may be missed upon review. Lajuana Matte, MD

## 2023-04-27 ENCOUNTER — Inpatient Hospital Stay: Payer: Medicare Other | Attending: Internal Medicine

## 2023-04-27 ENCOUNTER — Other Ambulatory Visit: Payer: Medicare Other

## 2023-04-27 ENCOUNTER — Inpatient Hospital Stay: Payer: Medicare Other

## 2023-04-27 ENCOUNTER — Inpatient Hospital Stay (HOSPITAL_BASED_OUTPATIENT_CLINIC_OR_DEPARTMENT_OTHER): Payer: Medicare Other | Admitting: Physician Assistant

## 2023-04-27 VITALS — BP 103/52 | HR 55 | Temp 97.5°F | Resp 16 | Wt 147.1 lb

## 2023-04-27 VITALS — BP 111/52 | HR 54 | Resp 16

## 2023-04-27 DIAGNOSIS — C782 Secondary malignant neoplasm of pleura: Secondary | ICD-10-CM | POA: Insufficient documentation

## 2023-04-27 DIAGNOSIS — C3412 Malignant neoplasm of upper lobe, left bronchus or lung: Secondary | ICD-10-CM | POA: Insufficient documentation

## 2023-04-27 DIAGNOSIS — C3482 Malignant neoplasm of overlapping sites of left bronchus and lung: Secondary | ICD-10-CM | POA: Diagnosis not present

## 2023-04-27 DIAGNOSIS — Z5112 Encounter for antineoplastic immunotherapy: Secondary | ICD-10-CM | POA: Insufficient documentation

## 2023-04-27 DIAGNOSIS — Z79899 Other long term (current) drug therapy: Secondary | ICD-10-CM | POA: Diagnosis not present

## 2023-04-27 DIAGNOSIS — R21 Rash and other nonspecific skin eruption: Secondary | ICD-10-CM | POA: Diagnosis not present

## 2023-04-27 LAB — CMP (CANCER CENTER ONLY)
ALT: 8 U/L (ref 0–44)
AST: 17 U/L (ref 15–41)
Albumin: 3.7 g/dL (ref 3.5–5.0)
Alkaline Phosphatase: 65 U/L (ref 38–126)
Anion gap: 3 — ABNORMAL LOW (ref 5–15)
BUN: 22 mg/dL (ref 8–23)
CO2: 30 mmol/L (ref 22–32)
Calcium: 9.3 mg/dL (ref 8.9–10.3)
Chloride: 108 mmol/L (ref 98–111)
Creatinine: 1.04 mg/dL (ref 0.61–1.24)
GFR, Estimated: >60 mL/min (ref >60)
Glucose, Bld: 84 mg/dL (ref 70–99)
Potassium: 4.6 mmol/L (ref 3.5–5.1)
Sodium: 141 mmol/L (ref 135–145)
Total Bilirubin: 0.5 mg/dL (ref 0.3–1.2)
Total Protein: 6.3 g/dL — ABNORMAL LOW (ref 6.5–8.1)

## 2023-04-27 LAB — CBC WITH DIFFERENTIAL (CANCER CENTER ONLY)
Abs Immature Granulocytes: 0.01 10*3/uL (ref 0.00–0.07)
Basophils Absolute: 0 10*3/uL (ref 0.0–0.1)
Basophils Relative: 0 %
Eosinophils Absolute: 0.7 10*3/uL — ABNORMAL HIGH (ref 0.0–0.5)
Eosinophils Relative: 9 %
HCT: 39.4 % (ref 39.0–52.0)
Hemoglobin: 12.7 g/dL — ABNORMAL LOW (ref 13.0–17.0)
Immature Granulocytes: 0 %
Lymphocytes Relative: 25 %
Lymphs Abs: 1.9 10*3/uL (ref 0.7–4.0)
MCH: 30 pg (ref 26.0–34.0)
MCHC: 32.2 g/dL (ref 30.0–36.0)
MCV: 93.1 fL (ref 80.0–100.0)
Monocytes Absolute: 0.9 10*3/uL (ref 0.1–1.0)
Monocytes Relative: 12 %
Neutro Abs: 4.1 10*3/uL (ref 1.7–7.7)
Neutrophils Relative %: 54 %
Platelet Count: 192 10*3/uL (ref 150–400)
RBC: 4.23 MIL/uL (ref 4.22–5.81)
RDW: 13.4 % (ref 11.5–15.5)
WBC Count: 7.7 10*3/uL (ref 4.0–10.5)
nRBC: 0 % (ref 0.0–0.2)

## 2023-04-27 MED ORDER — SODIUM CHLORIDE 0.9 % IV SOLN: Freq: Once | INTRAVENOUS | Status: AC

## 2023-04-27 MED ORDER — SODIUM CHLORIDE 0.9 % IV SOLN
1500.0000 mg | Freq: Once | INTRAVENOUS | Status: AC
  Administered 2023-04-27: 1500 mg via INTRAVENOUS
  Filled 2023-04-27: qty 30

## 2023-04-27 NOTE — Patient Instructions (Signed)

## 2023-05-13 ENCOUNTER — Telehealth: Payer: Self-pay | Admitting: Cardiovascular Disease

## 2023-05-13 MED ORDER — CARVEDILOL 3.125 MG PO TABS: 3.1250 mg | ORAL_TABLET | Freq: Two times a day (BID) | ORAL | 0 refills | Status: DC

## 2023-05-13 NOTE — Telephone Encounter (Signed)
*  STAT* If patient is at the pharmacy, call can be transferred to refill team.   1. Which medications need to be refilled? (please list name of each medication and dose if known) carvedilol (COREG) 3.125 MG tablet     4. Which pharmacy/location (including street and city if local pharmacy) is medication to be sent to? [id number] - [place], [place] - [address] AT [facility] [address]     5. Do they need a 30 day or 90 day supply? 90

## 2023-05-13 NOTE — Telephone Encounter (Signed)
Pt's medication was sent to pt's pharmacy as requested. Confirmation received.  °

## 2023-05-20 ENCOUNTER — Other Ambulatory Visit: Payer: Self-pay

## 2023-05-25 ENCOUNTER — Inpatient Hospital Stay: Payer: Medicare Other

## 2023-05-25 ENCOUNTER — Other Ambulatory Visit: Payer: Self-pay | Admitting: Medical Oncology

## 2023-05-25 ENCOUNTER — Other Ambulatory Visit: Payer: Medicare Other

## 2023-05-25 ENCOUNTER — Inpatient Hospital Stay: Payer: Medicare Other | Attending: Internal Medicine | Admitting: Internal Medicine

## 2023-05-25 DIAGNOSIS — C782 Secondary malignant neoplasm of pleura: Secondary | ICD-10-CM | POA: Diagnosis not present

## 2023-05-25 DIAGNOSIS — C3412 Malignant neoplasm of upper lobe, left bronchus or lung: Secondary | ICD-10-CM | POA: Insufficient documentation

## 2023-05-25 DIAGNOSIS — C3482 Malignant neoplasm of overlapping sites of left bronchus and lung: Secondary | ICD-10-CM

## 2023-05-25 DIAGNOSIS — R21 Rash and other nonspecific skin eruption: Secondary | ICD-10-CM | POA: Diagnosis not present

## 2023-05-25 DIAGNOSIS — Z5112 Encounter for antineoplastic immunotherapy: Secondary | ICD-10-CM | POA: Diagnosis not present

## 2023-05-25 DIAGNOSIS — Z23 Encounter for immunization: Secondary | ICD-10-CM

## 2023-05-25 LAB — CBC WITH DIFFERENTIAL (CANCER CENTER ONLY)
Abs Immature Granulocytes: 0.02 10*3/uL (ref 0.00–0.07)
Basophils Absolute: 0 10*3/uL (ref 0.0–0.1)
Basophils Relative: 0 %
Eosinophils Absolute: 0.4 10*3/uL (ref 0.0–0.5)
Eosinophils Relative: 5 %
HCT: 39.4 % (ref 39.0–52.0)
Hemoglobin: 12.8 g/dL — ABNORMAL LOW (ref 13.0–17.0)
Immature Granulocytes: 0 %
Lymphocytes Relative: 24 %
Lymphs Abs: 1.7 10*3/uL (ref 0.7–4.0)
MCH: 30 pg (ref 26.0–34.0)
MCHC: 32.5 g/dL (ref 30.0–36.0)
MCV: 92.5 fL (ref 80.0–100.0)
Monocytes Absolute: 0.8 10*3/uL (ref 0.1–1.0)
Monocytes Relative: 12 %
Neutro Abs: 4.1 10*3/uL (ref 1.7–7.7)
Neutrophils Relative %: 59 %
Platelet Count: 174 10*3/uL (ref 150–400)
RBC: 4.26 MIL/uL (ref 4.22–5.81)
RDW: 13.4 % (ref 11.5–15.5)
WBC Count: 7 10*3/uL (ref 4.0–10.5)
nRBC: 0 % (ref 0.0–0.2)

## 2023-05-25 LAB — CMP (CANCER CENTER ONLY)
ALT: 7 U/L (ref 0–44)
AST: 16 U/L (ref 15–41)
Albumin: 3.6 g/dL (ref 3.5–5.0)
Alkaline Phosphatase: 63 U/L (ref 38–126)
Anion gap: 4 — ABNORMAL LOW (ref 5–15)
BUN: 16 mg/dL (ref 8–23)
CO2: 28 mmol/L (ref 22–32)
Calcium: 8.8 mg/dL — ABNORMAL LOW (ref 8.9–10.3)
Chloride: 109 mmol/L (ref 98–111)
Creatinine: 0.91 mg/dL (ref 0.61–1.24)
GFR, Estimated: >60 mL/min (ref >60)
Glucose, Bld: 95 mg/dL (ref 70–99)
Potassium: 3.9 mmol/L (ref 3.5–5.1)
Sodium: 141 mmol/L (ref 135–145)
Total Bilirubin: 0.5 mg/dL (ref 0.3–1.2)
Total Protein: 5.9 g/dL — ABNORMAL LOW (ref 6.5–8.1)

## 2023-05-25 MED ORDER — INFLUENZA VAC A&B SURF ANT ADJ 0.5 ML IM SUSY
0.5000 mL | PREFILLED_SYRINGE | Freq: Once | INTRAMUSCULAR | Status: AC
  Administered 2023-05-25: 0.5 mL via INTRAMUSCULAR
  Filled 2023-05-25: qty 0.5

## 2023-05-25 MED ORDER — SODIUM CHLORIDE 0.9 % IV SOLN
1500.0000 mg | Freq: Once | INTRAVENOUS | Status: AC
  Administered 2023-05-25: 1500 mg via INTRAVENOUS
  Filled 2023-05-25: qty 30

## 2023-05-25 MED ORDER — SODIUM CHLORIDE 0.9 % IV SOLN: Freq: Once | INTRAVENOUS | Status: AC

## 2023-05-25 NOTE — Progress Notes (Signed)
Influenza vaccine ordered

## 2023-05-25 NOTE — Progress Notes (Signed)
[facility] [phone]   [phone]  OFFICE PROGRESS NOTE  [name], MD [address] [place] [address]  DIAGNOSIS: Extensive stage (T3, N2, M1b) small cell lung cancer presented with obstructive left upper lobe lung mass in addition to mediastinal lymphadenopathy and malignant left pleural effusion as well as pleural-based metastasis as well as suspicious liver metastasis diagnosed in June 2023.  PRIOR THERAPY: None  CURRENT THERAPY: Palliative systemic chemotherapy with carboplatin initiated for AUC of 4 on day 1 and etoposide 80 Mg/M2 on days 1, 2 and 3 with Cosela before the chemotherapy and Imfinzi 1500 Mg IV on day 1 every 3 weeks.  Status post 18 cycles.  Starting from cycle #5 the patient will be on maintenance treatment with Imfinzi 1500 Mg IV every 4 weeks.   INTERVAL HISTORY: Pedro Pope. 82 y.o. male returns to the clinic today for follow-up visit accompanied by his wife.Discussed the use of AI scribe software for clinical note transcription with the patient, who gave verbal consent to proceed.  History of Present Illness   Pedro Pope, an 82 year old individual with a diagnosis of extensive stage small cell lung cancer, was initially diagnosed in June 2023. He underwent four rounds of chemotherapy and immunotherapy, including carboplatin, etoposide, and Imfinzi. Since then, he has been on Imfinzi every four weeks s/p 18 cycles. He reports no significant side effects since the last visit, denying nausea, vomiting, and diarrhea. He did, however, experience a mild rash, which is managed with hydrocortisone cream.  Occasionally, he feels something when taking a deep breath, but denies any chest pain or significant breathing issues. He has not experienced any recent weight loss and has actually gained one and a half pounds since the last visit. He also raised a concern about the potential effects of almond milk on his stomach,  but reports no issues with regular milk.       MEDICAL HISTORY: Past Medical History:  Diagnosis Date   Anosmia    CAD (coronary artery disease), native coronary artery    a. 10/29/2013 antlat STEMI s/p DES to pLAD; residual 80 % proximal and 60% mid LCx diease   Chronic headaches    Dressler's syndrome (HCC)    a. placed on colchicine    ED (erectile dysfunction)    Elevated TSH    Fatty liver    Gout    Ischemic cardiomyopathy    a. 2D ECHO 11/04/14 w/ EF 40-45% with LV WMA, G2DD, mild MR, mild LA dilation, mod TR.   PAF (paroxysmal atrial fibrillation) (HCC)    a. Dx on 11/03/14 admission. Not placed on longterm AC due to DAPT w/ receent DES. Will plan for outpt heart monitor    Pre-diabetes    a. HgA1c 6.1 in 10/2014   Prostate cancer [facility])    s/p prostatectomy and XRT   Rheumatic fever 1947    ALLERGIES:  is allergic to penicillins, clindamycin, corn syrup [glucose], and aspirin adult low [aspirin].  MEDICATIONS:  Current Outpatient Medications  Medication Sig Dispense Refill   acetaminophen (TYLENOL) 500 MG tablet Take 500 mg by mouth every 6 (six) hours as needed for mild pain or moderate pain.     albuterol (VENTOLIN HFA) 108 (90 Base) MCG/ACT inhaler Inhale 1-2 puffs into the lungs every 6 (six) hours as needed. 8 g 2   ascorbic acid (VITAMIN C) 500 MG tablet Take 250 mg by mouth 2 (two) times daily.  Blood Pressure Monitor KIT For daily blood pressure monitoring;  Arm Cuff 1 each 0   carvedilol (COREG) 3.125 MG tablet Take 1 tablet (3.125 mg total) by mouth 2 (two) times daily with a meal. 180 tablet 0   Cholecalciferol (VITAMIN D3) 25 MCG (1000 UT) CAPS Take 1 capsule by mouth daily as needed (gout). (Patient not taking: Reported on 11/10/2022)     escitalopram (LEXAPRO) 5 MG tablet Take 5 mg by mouth daily.     hydrocortisone 1 % lotion Apply 1 Application topically 2 (two) times daily. 118 mL 0   hydrOXYzine (ATARAX) 10 MG tablet Take 1 tablet (10 mg total) by  mouth 3 (three) times daily as needed. 30 tablet 0   PROCTO-MED HC 2.5 % rectal cream Apply topically.     triamcinolone cream (KENALOG) 0.1 % Apply 1 Application topically 2 (two) times daily. 80 g 0   No current facility-administered medications for this visit.   Facility-Administered Medications Ordered in Other Visits  Medication Dose Route Frequency Provider Last Rate Last Admin   sodium chloride flush (NS) 0.9 % injection 10 mL  10 mL Intracatheter Once Si Gaul, MD        SURGICAL HISTORY:  Past Surgical History:  Procedure Laterality Date   CORONARY ANGIOPLASTY WITH STENT PLACEMENT  10/30/14   STEMI s/p DES to proximal LAD; residual 80 % proximal and 60% mid LCx diease   IR IMAGING GUIDED PORT INSERTION  03/12/2022   IR PATIENT EVAL TECH 0-60 MINS  10/20/2022   IR PATIENT EVAL TECH 0-60 MINS  10/27/2022   IR PATIENT EVAL TECH 0-60 MINS  10/30/2022   IR PATIENT EVAL TECH 0-60 MINS  10/23/2022   IR PATIENT EVAL TECH 0-60 MINS  11/06/2022   IR PATIENT EVAL TECH 0-60 MINS  11/13/2022   IR PATIENT EVAL TECH 0-60 MINS  12/01/2022   IR RADIOLOGIST EVAL & MGMT  10/02/2022   IR RADIOLOGIST EVAL & MGMT  10/13/2022   IR RADIOLOGIST EVAL & MGMT  10/14/2022   IR RADIOLOGIST EVAL & MGMT  10/17/2022   IR RADIOLOGIST EVAL & MGMT  10/20/2022   IR REMOVAL TUN ACCESS W/ PORT W/O FL MOD SED  10/13/2022   LEFT HEART CATHETERIZATION WITH CORONARY ANGIOGRAM N/A 10/30/2014   Procedure: LEFT HEART CATHETERIZATION WITH CORONARY ANGIOGRAM;  Surgeon: Runell Gess, MD;  Location: [facility] C[facility] LAB;  Service: Cardiovascular;  Laterality: N/A;   PERCUTANEOUS CORONARY STENT INTERVENTION (PCI-S)  10/30/2014   Procedure: PERCUTANEOUS CORONARY STENT INTERVENTION (PCI-S);  Surgeon: Runell Gess, MD;  Location: [facility] CATH LAB;  Service: Cardiovascular;;   PROSTATECTOMY     THORACENTESIS Left 12/31/2021   Procedure: THORACENTESIS;  Surgeon: Lorin Glass, MD;  Location: [facility] ENDOSCOPY;  Service: Pulmonary;  Laterality: Left;    TONSILLECTOMY     VIDEO ASSISTED THORACOSCOPY (VATS)/DECORTICATION Left 01/21/2022   Procedure: VIDEO ASSISTED THORACOSCOPY (VATS)/DECORTICATION;  Surgeon: Corliss Skains, MD;  Location: [facility] OR;  Service: Thoracic;  Laterality: Left;    REVIEW OF SYSTEMS:  A comprehensive review of systems was negative except for: Respiratory: positive for dyspnea on exertion Integument/breast: positive for rash   PHYSICAL EXAMINATION: General appearance: alert, cooperative, and no distress Head: Normocephalic, without obvious abnormality, atraumatic Neck: no adenopathy, no JVD, supple, symmetrical, trachea midline, and thyroid not enlarged, symmetric, no tenderness/mass/nodules Lymph nodes: Cervical, supraclavicular, and axillary nodes normal. Resp: clear to auscultation bilaterally Back: symmetric, no curvature. ROM normal. No CVA tenderness. Cardio: regular rate and  rhythm, S1, S2 normal, no murmur, click, rub or gallop GI: soft, non-tender; bowel sounds normal; no masses,  no organomegaly Extremities: extremities normal, atraumatic, no cyanosis or edema  ECOG PERFORMANCE STATUS: 1 - Symptomatic but completely ambulatory  Blood pressure (!) 113/51, pulse (!) 51, temperature (!) 97.5 F (36.4 C), temperature source Oral, resp. rate 17, height 5\' 8"  (1.727 m), weight 148 lb 6.4 oz (67.3 kg), SpO2 96%.  LABORATORY DATA: Lab Results  Component Value Date   WBC 7.0 05/25/2023   HGB 12.8 (L) 05/25/2023   HCT 39.4 05/25/2023   MCV 92.5 05/25/2023   PLT 174 05/25/2023      Chemistry      Component Value Date/Time   NA 141 05/25/2023 1033   K 3.9 05/25/2023 1033   CL 109 05/25/2023 1033   CO2 28 05/25/2023 1033   BUN 16 05/25/2023 1033   CREATININE 0.91 05/25/2023 1033      Component Value Date/Time   CALCIUM 8.8 (L) 05/25/2023 1033   ALKPHOS 63 05/25/2023 1033   AST 16 05/25/2023 1033   ALT 7 05/25/2023 1033   BILITOT 0.5 05/25/2023 1033       RADIOGRAPHIC STUDIES: No results  found.  ASSESSMENT AND PLAN: This is a very pleasant 82 years old white male with Extensive stage (T3, N2, M1a) small cell lung cancer presented with obstructive left upper lobe lung mass in addition to mediastinal lymphadenopathy and malignant left pleural effusion as well as pleural-based metastasis and suspicious liver metastasis diagnosed in June 2023. The patient is currently undergoing systemic chemotherapy with carboplatin for AUC of 4 on day 1, etoposide 80 Mg/M2 on days 1, 2 and 3 with Imfinzi 240 Mg/M2 on the days before chemotherapy and Imfinzi 1500 Mg IV on day 1 every 3 weeks.  Status post 18 cycles.  Starting from cycle #5 he will be on maintenance treatment with immunotherapy with Imfinzi 1500 Mg IV every 4 weeks. The patient has been tolerating this treatment fairly well.    Extensive Stage Small Cell Lung Cancer Diagnosed in June 2023, completed four rounds of carboplatin, etoposide, and Imfinzi. Currently on maintenance Imfinzi every four weeks s/p 18 cycles of treatment. No significant side effects reported except for a mild rash. No new symptoms of concern. -Continue Imfinzi every four weeks with cycle #19 today. -Manage rash with hydrocortisone cream as needed.  Dietary Concerns Patient inquired about potential gastrointestinal irritation from lectins in almond milk. No known lactose intolerance or issues with regular milk. -Advised to continue regular milk unless lactose intolerance or other issues arise.  Follow-up in four weeks for the next round of treatment.   He was advised to call immediately if he has any other concerning symptoms in the interval. The patient voices understanding of current disease status and treatment options and is in agreement with the current care plan.  All questions were answered. The patient knows to call the clinic with any problems, questions or concerns. We can certainly see the patient much sooner if necessary.  The total time spent in the  appointment was 20 minutes.  Disclaimer: This note was dictated with voice recognition software. Similar sounding words can inadvertently be transcribed and may not be corrected upon review.

## 2023-05-25 NOTE — Patient Instructions (Signed)
[place] [facility]  Discharge Instructions: Thank you for choosing [facility] to provide your oncology and hematology care.   If you have a lab appointment with the Cancer Center, please go directly to the Cancer Center and check in at the registration area.   Wear comfortable clothing and clothing appropriate for easy access to any Portacath or PICC line.   We strive to give you quality time with your provider. You may need to reschedule your appointment if you arrive late (15 or more minutes).  Arriving late affects you and other patients whose appointments are after yours.  Also, if you miss three or more appointments without notifying the office, you may be dismissed from the clinic at the provider's discretion.      For prescription refill requests, have your pharmacy contact our office and allow 72 hours for refills to be completed.    Today you received the following chemotherapy and/or immunotherapy agents: Imfinzi      To help prevent nausea and vomiting after your treatment, we encourage you to take your nausea medication as directed.  BELOW ARE SYMPTOMS THAT SHOULD BE REPORTED IMMEDIATELY: *FEVER GREATER THAN 100.4 F (38 C) OR HIGHER *CHILLS OR SWEATING *NAUSEA AND VOMITING THAT IS NOT CONTROLLED WITH YOUR NAUSEA MEDICATION *UNUSUAL SHORTNESS OF BREATH *UNUSUAL BRUISING OR BLEEDING *URINARY PROBLEMS (pain or burning when urinating, or frequent urination) *BOWEL PROBLEMS (unusual diarrhea, constipation, pain near the anus) TENDERNESS IN MOUTH AND THROAT WITH OR WITHOUT PRESENCE OF ULCERS (sore throat, sores in mouth, or a toothache) UNUSUAL RASH, SWELLING OR PAIN  UNUSUAL VAGINAL DISCHARGE OR ITCHING   Items with * indicate a potential emergency and should be followed up as soon as possible or go to the Emergency Department if any problems should occur.  Please show the CHEMOTHERAPY ALERT CARD or IMMUNOTHERAPY ALERT CARD at  check-in to the Emergency Department and triage nurse.  Should you have questions after your visit or need to cancel or reschedule your appointment, please contact [place] [facility]  Dept: [phone]  and follow the prompts.  Office hours are 8:00 a.m. to 4:30 p.m. Monday - Friday. Please note that voicemails left after 4:00 p.m. may not be returned until the following business day.  We are closed weekends and major holidays. You have access to a nurse at all times for urgent questions. Please call the main number to the clinic Dept: [phone] and follow the prompts.   For any non-urgent questions, you may also contact your provider using MyChart. We now offer e-Visits for anyone 32 and older to request care online for non-urgent symptoms. For details visit [email or web address].   Also download the MyChart app! Go to the app store, search "MyChart", open the app, select [place], and log in with your MyChart username and password.a

## 2023-05-26 DIAGNOSIS — E781 Pure hyperglyceridemia: Secondary | ICD-10-CM | POA: Diagnosis not present

## 2023-05-26 DIAGNOSIS — C3482 Malignant neoplasm of overlapping sites of left bronchus and lung: Secondary | ICD-10-CM | POA: Diagnosis not present

## 2023-05-26 DIAGNOSIS — J84112 Idiopathic pulmonary fibrosis: Secondary | ICD-10-CM | POA: Diagnosis not present

## 2023-05-26 DIAGNOSIS — G4734 Idiopathic sleep related nonobstructive alveolar hypoventilation: Secondary | ICD-10-CM | POA: Diagnosis not present

## 2023-05-26 DIAGNOSIS — J439 Emphysema, unspecified: Secondary | ICD-10-CM | POA: Diagnosis not present

## 2023-05-26 DIAGNOSIS — I700 Atherosclerosis of aorta: Secondary | ICD-10-CM | POA: Diagnosis not present

## 2023-05-26 DIAGNOSIS — I4891 Unspecified atrial fibrillation: Secondary | ICD-10-CM | POA: Diagnosis not present

## 2023-05-26 DIAGNOSIS — I2510 Atherosclerotic heart disease of native coronary artery without angina pectoris: Secondary | ICD-10-CM | POA: Diagnosis not present

## 2023-05-26 DIAGNOSIS — C3490 Malignant neoplasm of unspecified part of unspecified bronchus or lung: Secondary | ICD-10-CM | POA: Diagnosis not present

## 2023-06-22 ENCOUNTER — Inpatient Hospital Stay (HOSPITAL_BASED_OUTPATIENT_CLINIC_OR_DEPARTMENT_OTHER): Payer: Medicare Other | Admitting: Internal Medicine

## 2023-06-22 ENCOUNTER — Inpatient Hospital Stay: Payer: Medicare Other

## 2023-06-22 ENCOUNTER — Inpatient Hospital Stay: Payer: Medicare Other | Attending: Internal Medicine

## 2023-06-22 VITALS — BP 111/62 | HR 56 | Temp 97.8°F | Resp 17 | Ht 68.0 in | Wt 145.2 lb

## 2023-06-22 DIAGNOSIS — C3490 Malignant neoplasm of unspecified part of unspecified bronchus or lung: Secondary | ICD-10-CM

## 2023-06-22 DIAGNOSIS — C3482 Malignant neoplasm of overlapping sites of left bronchus and lung: Secondary | ICD-10-CM

## 2023-06-22 DIAGNOSIS — R0989 Other specified symptoms and signs involving the circulatory and respiratory systems: Secondary | ICD-10-CM | POA: Insufficient documentation

## 2023-06-22 DIAGNOSIS — Z5112 Encounter for antineoplastic immunotherapy: Secondary | ICD-10-CM | POA: Diagnosis not present

## 2023-06-22 DIAGNOSIS — C3412 Malignant neoplasm of upper lobe, left bronchus or lung: Secondary | ICD-10-CM | POA: Insufficient documentation

## 2023-06-22 DIAGNOSIS — J910 Malignant pleural effusion: Secondary | ICD-10-CM | POA: Insufficient documentation

## 2023-06-22 LAB — CMP (CANCER CENTER ONLY)
ALT: 7 U/L (ref 0–44)
AST: 16 U/L (ref 15–41)
Albumin: 3.8 g/dL (ref 3.5–5.0)
Alkaline Phosphatase: 68 U/L (ref 38–126)
Anion gap: 3 — ABNORMAL LOW (ref 5–15)
BUN: 21 mg/dL (ref 8–23)
CO2: 30 mmol/L (ref 22–32)
Calcium: 9.2 mg/dL (ref 8.9–10.3)
Chloride: 107 mmol/L (ref 98–111)
Creatinine: 0.97 mg/dL (ref 0.61–1.24)
GFR, Estimated: >60 mL/min (ref >60)
Glucose, Bld: 81 mg/dL (ref 70–99)
Potassium: 4.8 mmol/L (ref 3.5–5.1)
Sodium: 140 mmol/L (ref 135–145)
Total Bilirubin: 0.4 mg/dL (ref <1.2)
Total Protein: 6.2 g/dL — ABNORMAL LOW (ref 6.5–8.1)

## 2023-06-22 LAB — CBC WITH DIFFERENTIAL (CANCER CENTER ONLY)
Abs Immature Granulocytes: 0.04 10*3/uL (ref 0.00–0.07)
Basophils Absolute: 0 10*3/uL (ref 0.0–0.1)
Basophils Relative: 1 %
Eosinophils Absolute: 0.3 10*3/uL (ref 0.0–0.5)
Eosinophils Relative: 3 %
HCT: 39.3 % (ref 39.0–52.0)
Hemoglobin: 13.1 g/dL (ref 13.0–17.0)
Immature Granulocytes: 1 %
Lymphocytes Relative: 22 %
Lymphs Abs: 1.9 10*3/uL (ref 0.7–4.0)
MCH: 30.7 pg (ref 26.0–34.0)
MCHC: 33.3 g/dL (ref 30.0–36.0)
MCV: 92 fL (ref 80.0–100.0)
Monocytes Absolute: 0.9 10*3/uL (ref 0.1–1.0)
Monocytes Relative: 10 %
Neutro Abs: 5.5 10*3/uL (ref 1.7–7.7)
Neutrophils Relative %: 63 %
Platelet Count: 174 10*3/uL (ref 150–400)
RBC: 4.27 MIL/uL (ref 4.22–5.81)
RDW: 13.2 % (ref 11.5–15.5)
WBC Count: 8.6 10*3/uL (ref 4.0–10.5)
nRBC: 0 % (ref 0.0–0.2)

## 2023-06-22 MED ORDER — SODIUM CHLORIDE 0.9 % IV SOLN
1500.0000 mg | Freq: Once | INTRAVENOUS | Status: AC
  Administered 2023-06-22: 1500 mg via INTRAVENOUS
  Filled 2023-06-22: qty 30

## 2023-06-22 MED ORDER — SODIUM CHLORIDE 0.9 % IV SOLN: Freq: Once | INTRAVENOUS | Status: AC

## 2023-06-22 NOTE — Patient Instructions (Signed)
[place] [facility]  Discharge Instructions: Thank you for choosing [facility] to provide your oncology and hematology care.   If you have a lab appointment with the Cancer Center, please go directly to the Cancer Center and check in at the registration area.   Wear comfortable clothing and clothing appropriate for easy access to any Portacath or PICC line.   We strive to give you quality time with your provider. You may need to reschedule your appointment if you arrive late (15 or more minutes).  Arriving late affects you and other patients whose appointments are after yours.  Also, if you miss three or more appointments without notifying the office, you may be dismissed from the clinic at the provider's discretion.      For prescription refill requests, have your pharmacy contact our office and allow 72 hours for refills to be completed.    Today you received the following chemotherapy and/or immunotherapy agent: Durvalumab (imfinzi)      To help prevent nausea and vomiting after your treatment, we encourage you to take your nausea medication as directed.  BELOW ARE SYMPTOMS THAT SHOULD BE REPORTED IMMEDIATELY: *FEVER GREATER THAN 100.4 F (38 C) OR HIGHER *CHILLS OR SWEATING *NAUSEA AND VOMITING THAT IS NOT CONTROLLED WITH YOUR NAUSEA MEDICATION *UNUSUAL SHORTNESS OF BREATH *UNUSUAL BRUISING OR BLEEDING *URINARY PROBLEMS (pain or burning when urinating, or frequent urination) *BOWEL PROBLEMS (unusual diarrhea, constipation, pain near the anus) TENDERNESS IN MOUTH AND THROAT WITH OR WITHOUT PRESENCE OF ULCERS (sore throat, sores in mouth, or a toothache) UNUSUAL RASH, SWELLING OR PAIN  UNUSUAL VAGINAL DISCHARGE OR ITCHING   Items with * indicate a potential emergency and should be followed up as soon as possible or go to the Emergency Department if any problems should occur.  Please show the CHEMOTHERAPY ALERT CARD or IMMUNOTHERAPY ALERT  CARD at check-in to the Emergency Department and triage nurse.  Should you have questions after your visit or need to cancel or reschedule your appointment, please contact [place] [facility]  Dept: [phone]  and follow the prompts.  Office hours are 8:00 a.m. to 4:30 p.m. Monday - Friday. Please note that voicemails left after 4:00 p.m. may not be returned until the following business day.  We are closed weekends and major holidays. You have access to a nurse at all times for urgent questions. Please call the main number to the clinic Dept: [phone] and follow the prompts.   For any non-urgent questions, you may also contact your provider using MyChart. We now offer e-Visits for anyone 54 and older to request care online for non-urgent symptoms. For details visit [email or web address].   Also download the MyChart app! Go to the app store, search "MyChart", open the app, select [place], and log in with your MyChart username and password.  Durvalumab Injection What is this medication? DURVALUMAB (dur VAL ue mab) treats some types of cancer. It works by helping your immune system slow or stop the spread of cancer cells. It is a monoclonal antibody. This medicine may be used for other purposes; ask your health care provider or pharmacist if you have questions. COMMON BRAND NAME(S): IMFINZI What should I tell my care team before I take this medication? They need to know if you have any of these conditions: Allogeneic stem cell transplant (uses someone else's stem cells) Autoimmune diseases, such as Crohn disease, ulcerative colitis, lupus History of chest radiation Nervous  system problems, such as Guillain-Barre syndrome, myasthenia gravis Organ transplant An unusual or allergic reaction to durvalumab, other medications, foods, dyes, or preservatives Pregnant or trying to get pregnant Breast-feeding How should I use this medication? This medication is  infused into a vein. It is given by your care team in a hospital or clinic setting. A special MedGuide will be given to you before each treatment. Be sure to read this information carefully each time. Talk to your care team about the use of this medication in children. Special care may be needed. Overdosage: If you think you have taken too much of this medicine contact a poison control center or emergency room at once. NOTE: This medicine is only for you. Do not share this medicine with others. What if I miss a dose? Keep appointments for follow-up doses. It is important not to miss your dose. Call your care team if you are unable to keep an appointment. What may interact with this medication? Interactions have not been studied. This list may not describe all possible interactions. Give your health care provider a list of all the medicines, herbs, non-prescription drugs, or dietary supplements you use. Also tell them if you smoke, drink alcohol, or use illegal drugs. Some items may interact with your medicine. What should I watch for while using this medication? Your condition will be monitored carefully while you are receiving this medication. You may need blood work while taking this medication. This medication may cause serious skin reactions. They can happen weeks to months after starting the medication. Contact your care team right away if you notice fevers or flu-like symptoms with a rash. The rash may be red or purple and then turn into blisters or peeling of the skin. You may also notice a red rash with swelling of the face, lips, or lymph nodes in your neck or under your arms. Tell your care team right away if you have any change in your eyesight. Talk to your care team if you may be pregnant. Serious birth defects can occur if you take this medication during pregnancy and for 3 months after the last dose. You will need a negative pregnancy test before starting this medication. Contraception  is recommended while taking this medication and for 3 months after the last dose. Your care team can help you find the option that works for you. Do not breastfeed while taking this medication and for 3 months after the last dose. What side effects may I notice from receiving this medication? Side effects that you should report to your care team as soon as possible: Allergic reactions--skin rash, itching, hives, swelling of the face, lips, tongue, or throat Dry cough, shortness of breath or trouble breathing Eye pain, redness, irritation, or discharge with blurry or decreased vision Heart muscle inflammation--unusual weakness or fatigue, shortness of breath, chest pain, fast or irregular heartbeat, dizziness, swelling of the ankles, feet, or hands Hormone gland problems--headache, sensitivity to light, unusual weakness or fatigue, dizziness, fast or irregular heartbeat, increased sensitivity to cold or heat, excessive sweating, constipation, hair loss, increased thirst or amount of urine, tremors or shaking, irritability Infusion reactions--chest pain, shortness of breath or trouble breathing, feeling faint or lightheaded Kidney injury (glomerulonephritis)--decrease in the amount of urine, red or dark brown urine, foamy or bubbly urine, swelling of the ankles, hands, or feet Liver injury--right upper belly pain, loss of appetite, nausea, light-colored stool, dark yellow or brown urine, yellowing skin or eyes, unusual weakness or fatigue Pain, tingling, or  numbness in the hands or feet, muscle weakness, change in vision, confusion or trouble speaking, loss of balance or coordination, trouble walking, seizures Rash, fever, and swollen lymph nodes Redness, blistering, peeling, or loosening of the skin, including inside the mouth Sudden or severe stomach pain, bloody diarrhea, fever, nausea, vomiting Side effects that usually do not require medical attention (report these to your care team if they  continue or are bothersome): Bone, joint, or muscle pain Diarrhea Fatigue Loss of appetite Nausea Skin rash This list may not describe all possible side effects. Call your doctor for medical advice about side effects. You may report side effects to FDA at 1-800-FDA-1088. Where should I keep my medication? This medication is given in a hospital or clinic. It will not be stored at home. NOTE: This sheet is a summary. It may not cover all possible information. If you have questions about this medicine, talk to your doctor, pharmacist, or health care provider.  2024 Elsevier/Gold Standard (2021-12-17 00:00:00)

## 2023-06-22 NOTE — Progress Notes (Signed)
[facility] Telephone:([phone]) [phone]   [phone]  OFFICE PROGRESS NOTE  [name], MD [address] [place] [address]  DIAGNOSIS: Extensive stage (T3, N2, M1b) small cell lung cancer presented with obstructive left upper lobe lung mass in addition to mediastinal lymphadenopathy and malignant left pleural effusion as well as pleural-based metastasis as well as suspicious liver metastasis diagnosed in June 2023.  PRIOR THERAPY: None  CURRENT THERAPY: Palliative systemic chemotherapy with carboplatin initiated for AUC of 4 on day 1 and etoposide 80 Mg/M2 on days 1, 2 and 3 with Cosela before the chemotherapy and Imfinzi 1500 Mg IV on day 1 every 3 weeks.  Status post 18 cycles.  Starting from cycle #5 the patient will be on maintenance treatment with Imfinzi 1500 Mg IV every 4 weeks.   INTERVAL HISTORY: Pedro Pope. 82 y.o. male returns to the clinic today for follow-up visit accompanied by his wife.Discussed the use of AI scribe software for clinical note transcription with the patient, who gave verbal consent to proceed.  History of Present Illness   The patient, who has been undergoing  maintenance treatment with Imfinzi every 4 weeks for extensive stage small cell lung cancer that was initially diagnosed in June 2023.  He started with 4 cycles of systemic chemotherapy with carboplatin, etoposide and Imfinzi followed by the maintenance treatment., He reports no new complaints since the last visit four weeks ago. He denies experiencing chest pain, breathing issues, nausea, vomiting, or diarrhea. There has been a slight weight loss of approximately three pounds since October, which the patient attributes to a recent change in diet towards healthier food options.  The patient has not noticed any swelling in his legs. However, he reports an increase in chest congestion, which he believes is due to the cold weather. He has not taken any allergy medication  for this symptom.      MEDICAL HISTORY: Past Medical History:  Diagnosis Date   Anosmia    CAD (coronary artery disease), native coronary artery    a. 10/29/2013 antlat STEMI s/p DES to pLAD; residual 80 % proximal and 60% mid LCx diease   Chronic headaches    Dressler's syndrome (HCC)    a. placed on colchicine    ED (erectile dysfunction)    Elevated TSH    Fatty liver    Gout    Ischemic cardiomyopathy    a. 2D ECHO 11/04/14 w/ EF 40-45% with LV WMA, G2DD, mild MR, mild LA dilation, mod TR.   PAF (paroxysmal atrial fibrillation) (HCC)    a. Dx on 11/03/14 admission. Not placed on longterm AC due to DAPT w/ receent DES. Will plan for outpt heart monitor    Pre-diabetes    a. HgA1c 6.1 in 10/2014   Prostate cancer [facility])    s/p prostatectomy and XRT   Rheumatic fever 1947    ALLERGIES:  is allergic to penicillins, clindamycin, corn syrup [glucose], and aspirin adult low [aspirin].  MEDICATIONS:  Current Outpatient Medications  Medication Sig Dispense Refill   acetaminophen (TYLENOL) 500 MG tablet Take 500 mg by mouth every 6 (six) hours as needed for mild pain or moderate pain.     albuterol (VENTOLIN HFA) 108 (90 Base) MCG/ACT inhaler Inhale 1-2 puffs into the lungs every 6 (six) hours as needed. 8 g 2   ascorbic acid (VITAMIN C) 500 MG tablet Take 250 mg by mouth 2 (two) times daily.     Blood  Pressure Monitor KIT For daily blood pressure monitoring;  Arm Cuff 1 each 0   carvedilol (COREG) 3.125 MG tablet Take 1 tablet (3.125 mg total) by mouth 2 (two) times daily with a meal. 180 tablet 0   Cholecalciferol (VITAMIN D3) 25 MCG (1000 UT) CAPS Take 1 capsule by mouth daily as needed (gout). (Patient not taking: Reported on 11/10/2022)     escitalopram (LEXAPRO) 5 MG tablet Take 5 mg by mouth daily.     hydrocortisone 1 % lotion Apply 1 Application topically 2 (two) times daily. 118 mL 0   hydrOXYzine (ATARAX) 10 MG tablet Take 1 tablet (10 mg total) by mouth 3 (three) times daily  as needed. 30 tablet 0   PROCTO-MED HC 2.5 % rectal cream Apply topically.     triamcinolone cream (KENALOG) 0.1 % Apply 1 Application topically 2 (two) times daily. 80 g 0   No current facility-administered medications for this visit.   Facility-Administered Medications Ordered in Other Visits  Medication Dose Route Frequency Provider Last Rate Last Admin   sodium chloride flush (NS) 0.9 % injection 10 mL  10 mL Intracatheter Once Si Gaul, MD        SURGICAL HISTORY:  Past Surgical History:  Procedure Laterality Date   CORONARY ANGIOPLASTY WITH STENT PLACEMENT  10/30/14   STEMI s/p DES to proximal LAD; residual 80 % proximal and 60% mid LCx diease   IR IMAGING GUIDED PORT INSERTION  03/12/2022   IR PATIENT EVAL TECH 0-60 MINS  10/20/2022   IR PATIENT EVAL TECH 0-60 MINS  10/27/2022   IR PATIENT EVAL TECH 0-60 MINS  10/30/2022   IR PATIENT EVAL TECH 0-60 MINS  10/23/2022   IR PATIENT EVAL TECH 0-60 MINS  11/06/2022   IR PATIENT EVAL TECH 0-60 MINS  11/13/2022   IR PATIENT EVAL TECH 0-60 MINS  12/01/2022   IR RADIOLOGIST EVAL & MGMT  10/02/2022   IR RADIOLOGIST EVAL & MGMT  10/13/2022   IR RADIOLOGIST EVAL & MGMT  10/14/2022   IR RADIOLOGIST EVAL & MGMT  10/17/2022   IR RADIOLOGIST EVAL & MGMT  10/20/2022   IR REMOVAL TUN ACCESS W/ PORT W/O FL MOD SED  10/13/2022   LEFT HEART CATHETERIZATION WITH CORONARY ANGIOGRAM N/A 10/30/2014   Procedure: LEFT HEART CATHETERIZATION WITH CORONARY ANGIOGRAM;  Surgeon: Runell Gess, MD;  Location: [facility] C[facility] LAB;  Service: Cardiovascular;  Laterality: N/A;   PERCUTANEOUS CORONARY STENT INTERVENTION (PCI-S)  10/30/2014   Procedure: PERCUTANEOUS CORONARY STENT INTERVENTION (PCI-S);  Surgeon: Runell Gess, MD;  Location: [facility] CATH LAB;  Service: Cardiovascular;;   PROSTATECTOMY     THORACENTESIS Left 12/31/2021   Procedure: THORACENTESIS;  Surgeon: Lorin Glass, MD;  Location: [facility] ENDOSCOPY;  Service: Pulmonary;  Laterality: Left;   TONSILLECTOMY     VIDEO  ASSISTED THORACOSCOPY (VATS)/DECORTICATION Left 01/21/2022   Procedure: VIDEO ASSISTED THORACOSCOPY (VATS)/DECORTICATION;  Surgeon: Corliss Skains, MD;  Location: [facility] OR;  Service: Thoracic;  Laterality: Left;    REVIEW OF SYSTEMS:  A comprehensive review of systems was negative.   PHYSICAL EXAMINATION: General appearance: alert, cooperative, and no distress Head: Normocephalic, without obvious abnormality, atraumatic Neck: no adenopathy, no JVD, supple, symmetrical, trachea midline, and thyroid not enlarged, symmetric, no tenderness/mass/nodules Lymph nodes: Cervical, supraclavicular, and axillary nodes normal. Resp: clear to auscultation bilaterally Back: symmetric, no curvature. ROM normal. No CVA tenderness. Cardio: regular rate and rhythm, S1, S2 normal, no murmur, click, rub or gallop GI: soft, non-tender;  bowel sounds normal; no masses,  no organomegaly Extremities: extremities normal, atraumatic, no cyanosis or edema  ECOG PERFORMANCE STATUS: 1 - Symptomatic but completely ambulatory  Blood pressure 111/62, pulse (!) 56, temperature 97.8 F (36.6 C), temperature source Oral, resp. rate 17, height 5\' 8"  (1.727 m), weight 145 lb 3.2 oz (65.9 kg), SpO2 100%.  LABORATORY DATA: Lab Results  Component Value Date   WBC 7.0 05/25/2023   HGB 12.8 (L) 05/25/2023   HCT 39.4 05/25/2023   MCV 92.5 05/25/2023   PLT 174 05/25/2023      Chemistry      Component Value Date/Time   NA 141 05/25/2023 1033   K 3.9 05/25/2023 1033   CL 109 05/25/2023 1033   CO2 28 05/25/2023 1033   BUN 16 05/25/2023 1033   CREATININE 0.91 05/25/2023 1033      Component Value Date/Time   CALCIUM 8.8 (L) 05/25/2023 1033   ALKPHOS 63 05/25/2023 1033   AST 16 05/25/2023 1033   ALT 7 05/25/2023 1033   BILITOT 0.5 05/25/2023 1033       RADIOGRAPHIC STUDIES: No results found.  ASSESSMENT AND PLAN: This is a very pleasant 82 years old white male with Extensive stage (T3, N2, M1a) small cell lung  cancer presented with obstructive left upper lobe lung mass in addition to mediastinal lymphadenopathy and malignant left pleural effusion as well as pleural-based metastasis and suspicious liver metastasis diagnosed in June 2023. The patient is currently undergoing systemic chemotherapy with carboplatin for AUC of 4 on day 1, etoposide 80 Mg/M2 on days 1, 2 and 3 with Imfinzi 240 Mg/M2 on the days before chemotherapy and Imfinzi 1500 Mg IV on day 1 every 3 weeks.  Status post 19 cycles.  Starting from cycle #5 he will be on maintenance treatment with immunotherapy with Imfinzi 1500 Mg IV every 4 weeks.  He has been tolerating this treatment fairly well.    Extensive stage (T3, N2, M1a) small cell lung cancer presented with obstructive left upper lobe lung mass in addition to mediastinal lymphadenopathy and malignant left pleural effusion as well as pleural-based metastasis and suspicious liver metastasis diagnosed in June 2023. The patient is currently undergoing systemic chemotherapy with carboplatin for AUC of 4 on day 1, etoposide 80 Mg/M2 on days 1, 2 and 3 with Imfinzi 240 Mg/M2 on the days before chemotherapy and Imfinzi 1500 Mg IV on day 1 every 3 weeks.  Status post 19 cycles.  Stable with no new complaints. Currently on cycle 20 of chemotherapy. No new symptoms of nausea, vomiting, diarrhea, or weight loss. -Continue current chemotherapy regimen. -Order CT scan of chest, abdomen, and pelvis to be done 1 week prior to next appointment.  Chest Congestion Increased chest congestion, possibly due to cold weather. -Consider over-the-counter allergy medication to help with symptoms.  Weight Loss Minor weight loss noted, possibly due to dietary changes at home. -Monitor weight and maintain healthy diet.  Follow-up in 4 weeks with CT scan results.   He was advised to call immediately if he has any concerning symptoms in the interval. The patient voices understanding of current disease status and  treatment options and is in agreement with the current care plan.  All questions were answered. The patient knows to call the clinic with any problems, questions or concerns. We can certainly see the patient much sooner if necessary.  The total time spent in the appointment was 20 minutes.  Disclaimer: This note was dictated with voice recognition software.  Similar sounding words can inadvertently be transcribed and may not be corrected upon review.

## 2023-06-23 ENCOUNTER — Telehealth: Payer: Self-pay | Admitting: Medical Oncology

## 2023-06-23 ENCOUNTER — Encounter: Payer: Self-pay | Admitting: Medical Oncology

## 2023-06-23 NOTE — Telephone Encounter (Signed)
Pt notified of CT exam expected date.

## 2023-06-25 ENCOUNTER — Other Ambulatory Visit: Payer: Self-pay

## 2023-06-29 ENCOUNTER — Ambulatory Visit: Payer: Medicare Other | Attending: Cardiovascular Disease | Admitting: Cardiovascular Disease

## 2023-06-29 ENCOUNTER — Encounter: Payer: Self-pay | Admitting: Cardiovascular Disease

## 2023-06-29 VITALS — BP 108/57 | HR 55 | Ht 69.0 in | Wt 145.2 lb

## 2023-06-29 DIAGNOSIS — I2109 ST elevation (STEMI) myocardial infarction involving other coronary artery of anterior wall: Secondary | ICD-10-CM | POA: Diagnosis not present

## 2023-06-29 DIAGNOSIS — I2510 Atherosclerotic heart disease of native coronary artery without angina pectoris: Secondary | ICD-10-CM | POA: Diagnosis not present

## 2023-06-29 DIAGNOSIS — I255 Ischemic cardiomyopathy: Secondary | ICD-10-CM | POA: Diagnosis not present

## 2023-06-29 DIAGNOSIS — I480 Paroxysmal atrial fibrillation: Secondary | ICD-10-CM | POA: Insufficient documentation

## 2023-06-29 DIAGNOSIS — E782 Mixed hyperlipidemia: Secondary | ICD-10-CM | POA: Diagnosis not present

## 2023-06-29 NOTE — Progress Notes (Signed)
06/29/2023 Pedro Pope.   01/14/1941  [id number]  Primary Physician [name], MD Primary Cardiologist: Runell Gess MD FACP, [place], [place], [place]  HPI:  Pedro Pope. is a 82 y.o.   thin appearing married Caucasian male father of 3, grandfather 7 grandchildren and great grandfather of 8 great grandchildren who I last saw in the office 06/06/2022. He  is a patient of Dr. Jonny Ruiz Russo's. 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 He basically had no risk factors prior to his presentation. He had an acute anterolateral infarction.  He had intervention by myself 10/30/14 with a drug-eluting stent in his proximal LAD. He had a left dominant system and had moderate proximal AV groove circumflex disease with high-grade nondominant RCA disease. Ejection fraction was in the 40-45% range with moderate anteroapical hypokinesia. He was discharged home 2 days after admission and was admitted several days later with A. Fib with RVR which she was symptomatic. His EKG showed changes and he has converted to sinus rhythm. Consideration was given to oral anticoagulation over the risks of bleeding were thought to outweigh the benefits. He is wearing an event monitor and has noticed several episodes of brief PAF. His beta blocker was discontinued because of bradycardia and hypotension. Recent Myoview showed dense scar in the LAD territory and 2-D echo revealed an EF of 30-35%. He participated  in cardiac rehabilitation and is relatively asymptomatic. His most recent 2-D echo performed 04/19/15 showed improvement in his LV function up to the 40-45% range. I did send him to see Dr. Johney Frame for consideration of ICD therapy and to discuss his A. Fib. He was ultimately placed on Xarelto oral anti-coagulation and his Esperanza Heir was transitioned to Plavix to mitigate his bleeding risk.   He unfortunately has developed lung cancer stage IV.  He has had chemotherapy and immunotherapy.  He  does have a Port-A-Cath in place.  He has had a Pleurx tube placed as well for pleural effusion which was managed by Dr. Cliffton Asters.  He has gained the weight back to the last a year ago.  Since I saw him a year ago he is remained stable.  He denies chest pain but does get some shortness of breath.  His most recent 2D echo performed 02/25/2022 revealed EF of 45 to 50%.  EKG today shows anterolateral T inversion, new since his last EKG although he is relatively asymptomatic.   Current Meds  Medication Sig   acetaminophen (TYLENOL) 500 MG tablet Take 500 mg by mouth every 6 (six) hours as needed for mild pain or moderate pain.   albuterol (VENTOLIN HFA) 108 (90 Base) MCG/ACT inhaler Inhale 1-2 puffs into the lungs every 6 (six) hours as needed.   ascorbic acid (VITAMIN C) 500 MG tablet Take 500 mg by mouth 2 (two) times daily.   Blood Pressure Monitor KIT For daily blood pressure monitoring;  Arm Cuff   carvedilol (COREG) 3.125 MG tablet Take 1 tablet (3.125 mg total) by mouth 2 (two) times daily with a meal. (Patient taking differently: Take 3.125 mg by mouth daily.)   escitalopram (LEXAPRO) 5 MG tablet Take 5 mg by mouth daily.   hydrocortisone 1 % lotion Apply 1 Application topically 2 (two) times daily.   hydrOXYzine (ATARAX) 10 MG tablet Take 1 tablet (10 mg total) by mouth 3 (three) times daily as needed.   PROCTO-MED HC 2.5 % rectal cream Apply topically.   triamcinolone cream (KENALOG)  0.1 % Apply 1 Application topically 2 (two) times daily.     Allergies  Allergen Reactions   Penicillins Anaphylaxis   Clindamycin Other (See Comments)   Corn Syrup [Glucose] Other (See Comments)    High fructose corn syrup causes gout   Aspirin Adult Low [Aspirin] Itching and Rash    Dr. Timothy Lasso suspects that long term caused a rash, short term use appears to be ok    Social History   Socioeconomic History   Marital status: Married    Spouse name: Not on file   Number of children: Not on file    Years of education: Not on file   Highest education level: Not on file  Occupational History   Not on file  Tobacco Use   Smoking status: Former    Current packs/day: 0.00    Types: Cigarettes    Quit date: 08/18/1992    Years since quitting: 30.8   Smokeless tobacco: Never  Vaping Use   Vaping status: Not on file  Substance and Sexual Activity   Alcohol use: Not Currently    Comment: social   Drug use: No   Sexual activity: Not Currently  Other Topics Concern   Not on file  Social History Narrative   Pt lives in [place] with spouse.  Realtor.  Retired Hydrographic surveyor   Social Determinants of Corporate investment banker Strain: Not on [employer] Insecurity: Not on file  Transportation Needs: No Transportation Needs (04/15/2021)   Received from [facility], [facility]   PRAPARE - Transportation    In the past 12 months, has lack of transportation kept you from medical appointments or from getting medications?: No    Lack of Transportation (Non-Medical): No  Physical Activity: Not on file  Stress: Not on file  Social Connections: Not on file  Intimate Partner Violence: Not on file     Review of Systems: General: negative for chills, fever, night sweats or weight changes.  Cardiovascular: negative for chest pain, dyspnea on exertion, edema, orthopnea, palpitations, paroxysmal nocturnal dyspnea or shortness of breath Dermatological: negative for rash Respiratory: negative for cough or wheezing Urologic: negative for hematuria Abdominal: negative for nausea, vomiting, diarrhea, bright red blood per rectum, melena, or hematemesis Neurologic: negative for visual changes, syncope, or dizziness All other systems reviewed and are otherwise negative except as noted above.    Blood pressure (!) 108/57, pulse (!) 55, height 5\' 9"  (1.753 m), weight 145 lb 3.2 oz (65.9 kg).  General appearance: alert and no distress Neck: no adenopathy, no carotid  bruit, no JVD, supple, symmetrical, trachea midline, and thyroid not enlarged, symmetric, no tenderness/mass/nodules Lungs: clear to auscultation bilaterally Heart: regular rate and rhythm, S1, S2 normal, no murmur, click, rub or gallop Extremities: extremities normal, atraumatic, no cyanosis or edema Pulses: 2+ and symmetric Skin: Skin color, texture, turgor normal. No rashes or lesions Neurologic: Grossly normal  EKG EKG Interpretation Date/Time:  Monday June 29 2023 15:17:34 EST Ventricular Rate:  56 PR Interval:  220 QRS Duration:  90 QT Interval:  458 QTC Calculation: 441 R Axis:   35  Text Interpretation: Sinus bradycardia with 1st degree A-V block Inferior infarct (cited on or before 19-Jan-2022) Anterior infarct , age undetermined T wave abnormality, consider lateral ischemia When compared with ECG of 19-Jan-2022 09:45, Significant changes have occurred Confirmed by [name] [phone]) on 06/29/2023 3:51:58 PM    ASSESSMENT AND PLAN:   ST elevation myocardial infarction (STEMI)  involving other coronary artery of anterior wall - STEMI s/p DES to proximal LAD; residual 80 % proximal and 60% mid LCx diease History of anterior STEMI 10/30/2014.  I put in a drug-eluting stent in the proximal LAD.  He had a left dominant system with moderate proximal AV groove circumflex disease and high-grade nondominant RCA disease.  His EF at that time was 40 to 40 to 45%.  His most recent 2D echo performed 02/25/2022 revealed EF of 45 to 50%.  He denies chest pain but does get some shortness of breath.  Hyperlipidemia History of hypokalemia statin therapy with lipid profile performed 11/06/2022 revealing a total cholesterol 172, LDL of 108 and HDL 49.  This is considerably higher than his lipid profile performed 10/30/2021 at which time total cholesterol is 87, LDL was 39 and HDL was 39.  Ischemic cardiomyopathy History of ischemic cardiomyopathy with an EF in the 45 to 50% range.  He is on  carvedilol.  PAF post MI History of PAF post MI currently not on oral anticoagulation.     Runell Gess MD FACP,FACC,FAHA, [facility] 06/29/2023 4:09 PM

## 2023-06-29 NOTE — Assessment & Plan Note (Signed)
History of hypokalemia statin therapy with lipid profile performed 11/06/2022 revealing a total cholesterol 172, LDL of 108 and HDL 49.  This is considerably higher than his lipid profile performed 10/30/2021 at which time total cholesterol is 87, LDL was 39 and HDL was 39.

## 2023-06-29 NOTE — Patient Instructions (Signed)
Medication Instructions:  No medication changes today. *If you need a refill on your cardiac medications before your next appointment, please call your pharmacy*    Testing/Procedures: Your physician has requested that you have an echocardiogram. Echocardiography is a painless test that uses sound waves to create images of your heart. It provides your doctor with information about the size and shape of your heart and how well your heart's chambers and valves are working. This procedure takes approximately one hour. There are no restrictions for this procedure. Please do NOT wear cologne, perfume, aftershave, or lotions (deodorant is allowed).  [address]. Please arrive 15 minutes prior to your appointment time.  Please note: We ask at that you not bring children with you during ultrasound (echo/ vascular) testing. Due to room size and safety concerns, children are not allowed in the ultrasound rooms during exams. Our front office staff cannot provide observation of children in our lobby area while testing is being conducted. An adult accompanying a patient to their appointment will only be allowed in the ultrasound room at the discretion of the ultrasound technician under special circumstances. We apologize for any inconvenience.    Follow-Up: At [facility], you and your health needs are our priority.  As part of our continuing mission to provide you with exceptional heart care, we have created designated Provider Care Teams.  These Care Teams include your primary Cardiologist (physician) and Advanced Practice Providers (APPs -  Physician Assistants and Nurse Practitioners) who all work together to provide you with the care you need, when you need it.  We recommend signing up for the patient portal called "MyChart".  Sign up information is provided on this After Visit Summary.  MyChart is used to connect with patients for Virtual Visits (Telemedicine).  Patients are able to view  lab/test results, encounter notes, upcoming appointments, etc.  Non-urgent messages can be sent to your provider as well.   To learn more about what you can do with MyChart, go to [email or web address].    Your next appointment:   6 month(s)  Provider:   [name], NP      Then, [name], MD will plan to see you again in 12 month(s).

## 2023-06-29 NOTE — Assessment & Plan Note (Signed)
History of anterior STEMI 10/30/2014.  I put in a drug-eluting stent in the proximal LAD.  He had a left dominant system with moderate proximal AV groove circumflex disease and high-grade nondominant RCA disease.  His EF at that time was 40 to 40 to 45%.  His most recent 2D echo performed 02/25/2022 revealed EF of 45 to 50%.  He denies chest pain but does get some shortness of breath.

## 2023-06-29 NOTE — Assessment & Plan Note (Signed)
History of ischemic cardiomyopathy with an EF in the 45 to 50% range.  He is on carvedilol.

## 2023-06-29 NOTE — Assessment & Plan Note (Signed)
History of PAF post MI currently not on oral anticoagulation.

## 2023-07-07 ENCOUNTER — Encounter: Payer: Self-pay | Admitting: Internal Medicine

## 2023-07-10 ENCOUNTER — Ambulatory Visit (HOSPITAL_COMMUNITY): Payer: Medicare Other

## 2023-07-20 ENCOUNTER — Ambulatory Visit (HOSPITAL_COMMUNITY)
Admission: RE | Admit: 2023-07-20 | Discharge: 2023-07-20 | Disposition: A | Discharge status: Home / Self Care | Payer: Medicare Other | Source: Ambulatory Visit | Attending: Internal Medicine | Admitting: Internal Medicine

## 2023-07-20 ENCOUNTER — Encounter (HOSPITAL_COMMUNITY): Payer: Self-pay

## 2023-07-20 ENCOUNTER — Other Ambulatory Visit: Payer: Self-pay | Admitting: Internal Medicine

## 2023-07-20 DIAGNOSIS — C3490 Malignant neoplasm of unspecified part of unspecified bronchus or lung: Secondary | ICD-10-CM

## 2023-07-20 DIAGNOSIS — N200 Calculus of kidney: Secondary | ICD-10-CM | POA: Diagnosis not present

## 2023-07-20 DIAGNOSIS — C3482 Malignant neoplasm of overlapping sites of left bronchus and lung: Secondary | ICD-10-CM | POA: Diagnosis not present

## 2023-07-20 DIAGNOSIS — I7102 Dissection of abdominal aorta: Secondary | ICD-10-CM | POA: Diagnosis not present

## 2023-07-20 DIAGNOSIS — R590 Localized enlarged lymph nodes: Secondary | ICD-10-CM | POA: Diagnosis not present

## 2023-07-20 MED ORDER — IOHEXOL 300 MG/ML  SOLN
100.0000 mL | Freq: Once | INTRAMUSCULAR | Status: AC | PRN
  Administered 2023-07-20: 100 mL via INTRAVENOUS

## 2023-07-23 ENCOUNTER — Inpatient Hospital Stay: Payer: Medicare Other

## 2023-07-23 ENCOUNTER — Encounter: Payer: Self-pay | Admitting: Medical Oncology

## 2023-07-23 ENCOUNTER — Inpatient Hospital Stay: Payer: Medicare Other | Attending: Internal Medicine | Admitting: Internal Medicine

## 2023-07-23 VITALS — BP 113/46 | HR 63 | Temp 97.8°F | Resp 17 | Ht 69.0 in | Wt 147.4 lb

## 2023-07-23 VITALS — BP 110/58 | HR 65 | Resp 17

## 2023-07-23 DIAGNOSIS — Z5112 Encounter for antineoplastic immunotherapy: Secondary | ICD-10-CM | POA: Diagnosis not present

## 2023-07-23 DIAGNOSIS — C3482 Malignant neoplasm of overlapping sites of left bronchus and lung: Secondary | ICD-10-CM

## 2023-07-23 DIAGNOSIS — C782 Secondary malignant neoplasm of pleura: Secondary | ICD-10-CM | POA: Insufficient documentation

## 2023-07-23 DIAGNOSIS — C787 Secondary malignant neoplasm of liver and intrahepatic bile duct: Secondary | ICD-10-CM | POA: Diagnosis not present

## 2023-07-23 DIAGNOSIS — C3412 Malignant neoplasm of upper lobe, left bronchus or lung: Secondary | ICD-10-CM | POA: Insufficient documentation

## 2023-07-23 LAB — CBC WITH DIFFERENTIAL (CANCER CENTER ONLY)
Abs Immature Granulocytes: 0.03 10*3/uL (ref 0.00–0.07)
Basophils Absolute: 0 10*3/uL (ref 0.0–0.1)
Basophils Relative: 1 %
Eosinophils Absolute: 0.4 10*3/uL (ref 0.0–0.5)
Eosinophils Relative: 4 %
HCT: 40.2 % (ref 39.0–52.0)
Hemoglobin: 13 g/dL (ref 13.0–17.0)
Immature Granulocytes: 0 %
Lymphocytes Relative: 20 %
Lymphs Abs: 1.6 10*3/uL (ref 0.7–4.0)
MCH: 30.2 pg (ref 26.0–34.0)
MCHC: 32.3 g/dL (ref 30.0–36.0)
MCV: 93.3 fL (ref 80.0–100.0)
Monocytes Absolute: 1 10*3/uL (ref 0.1–1.0)
Monocytes Relative: 12 %
Neutro Abs: 5 10*3/uL (ref 1.7–7.7)
Neutrophils Relative %: 63 %
Platelet Count: 190 10*3/uL (ref 150–400)
RBC: 4.31 MIL/uL (ref 4.22–5.81)
RDW: 13.6 % (ref 11.5–15.5)
WBC Count: 8 10*3/uL (ref 4.0–10.5)
nRBC: 0 % (ref 0.0–0.2)

## 2023-07-23 LAB — CMP (CANCER CENTER ONLY)
ALT: 9 U/L (ref 0–44)
AST: 18 U/L (ref 15–41)
Albumin: 3.9 g/dL (ref 3.5–5.0)
Alkaline Phosphatase: 63 U/L (ref 38–126)
Anion gap: 4 — ABNORMAL LOW (ref 5–15)
BUN: 28 mg/dL — ABNORMAL HIGH (ref 8–23)
CO2: 31 mmol/L (ref 22–32)
Calcium: 9.2 mg/dL (ref 8.9–10.3)
Chloride: 109 mmol/L (ref 98–111)
Creatinine: 1 mg/dL (ref 0.61–1.24)
GFR, Estimated: >60 mL/min (ref >60)
Glucose, Bld: 80 mg/dL (ref 70–99)
Potassium: 4.2 mmol/L (ref 3.5–5.1)
Sodium: 144 mmol/L (ref 135–145)
Total Bilirubin: 0.4 mg/dL (ref <1.2)
Total Protein: 6.3 g/dL — ABNORMAL LOW (ref 6.5–8.1)

## 2023-07-23 MED ORDER — SODIUM CHLORIDE 0.9 % IV SOLN
1500.0000 mg | Freq: Once | INTRAVENOUS | Status: AC
  Administered 2023-07-23: 1500 mg via INTRAVENOUS
  Filled 2023-07-23: qty 30

## 2023-07-23 MED ORDER — SODIUM CHLORIDE 0.9 % IV SOLN: Freq: Once | INTRAVENOUS | Status: AC

## 2023-07-23 NOTE — Progress Notes (Signed)
Patient seen by Dr. Mohamed  Vitals are within treatment parameters.  Labs reviewed: and are within treatment parameters.  Per physician team, patient is ready for treatment and there are NO modifications to the treatment plan.  

## 2023-07-23 NOTE — Progress Notes (Signed)
[facility] Telephone:([phone]   [phone]  OFFICE PROGRESS NOTE  [name], MD [address] [place] [address]  DIAGNOSIS: Extensive stage (T3, N2, M1b) small cell lung cancer presented with obstructive left upper lobe lung mass in addition to mediastinal lymphadenopathy and malignant left pleural effusion as well as pleural-based metastasis as well as suspicious liver metastasis diagnosed in June 2023.  PRIOR THERAPY: None  CURRENT THERAPY: Palliative systemic chemotherapy with carboplatin initiated for AUC of 4 on day 1 and etoposide 80 Mg/M2 on days 1, 2 and 3 with Cosela before the chemotherapy and Imfinzi 1500 Mg IV on day 1 every 3 weeks.  Status post 18 cycles.  Starting from cycle #5 the patient will be on maintenance treatment with Imfinzi 1500 Mg IV every 4 weeks.   INTERVAL HISTORY: Pedro Pope. 82 y.o. male returns to the clinic today for follow-up visit accompanied by his son.Discussed the use of AI scribe software for clinical note transcription with the patient, who gave verbal consent to proceed.  History of Present Illness   Pedro Pope, an 82 year old patient with extensive stage small cell lung cancer, has been undergoing treatment since June 2023. The initial treatment involved four cycles of a combination of chemotherapy and immunotherapy, including carboplatin, etoposide, and Imfinzi. From the fifth cycle onwards, the patient has been receiving only immunotherapy with Imfinzi every four weeks, totaling twenty cycles to date.  Over the past four weeks, the patient has been experiencing increased pain on the left side of the chest. This pain was reported to his cardiologist, who suggested that the heart, rather than the lung, might be the source of the discomfort. The patient reports shortness of breath only during periods of high activity.  In addition to the chest pain, the patient has been experiencing intermittent  pain in the left lower quadrant of the abdomen. There has been no significant weight loss since the last visit; in fact, the patient has gained two pounds, possibly due to increased food intake.  The most recent scan showed a lymph node near the liver area that has increased in size by a few millimeters since the last scan. This growth is suspected to be cancerous. However, the rest of the scan results were satisfactory. The patient has not had any radiation treatment at this facility before.       MEDICAL HISTORY: Past Medical History:  Diagnosis Date   Anosmia    CAD (coronary artery disease), native coronary artery    a. 10/29/2013 antlat STEMI s/p DES to pLAD; residual 80 % proximal and 60% mid LCx diease   Chronic headaches    Dressler's syndrome (HCC)    a. placed on colchicine    ED (erectile dysfunction)    Elevated TSH    Fatty liver    Gout    Ischemic cardiomyopathy    a. 2D ECHO 11/04/14 w/ EF 40-45% with LV WMA, G2DD, mild MR, mild LA dilation, mod TR.   PAF (paroxysmal atrial fibrillation) (HCC)    a. Dx on 11/03/14 admission. Not placed on longterm AC due to DAPT w/ receent DES. Will plan for outpt heart monitor    Pre-diabetes    a. HgA1c 6.1 in 10/2014   Prostate cancer [facility])    s/p prostatectomy and XRT   Rheumatic fever 08/18/1945   Small cell lung cancer (HCC) 01/2022    ALLERGIES:  is allergic to penicillins, clindamycin, corn syrup [glucose], and  aspirin adult low [aspirin].  MEDICATIONS:  Current Outpatient Medications  Medication Sig Dispense Refill   acetaminophen (TYLENOL) 500 MG tablet Take 500 mg by mouth every 6 (six) hours as needed for mild pain or moderate pain.     albuterol (VENTOLIN HFA) 108 (90 Base) MCG/ACT inhaler Inhale 1-2 puffs into the lungs every 6 (six) hours as needed. 8 g 2   ascorbic acid (VITAMIN C) 500 MG tablet Take 500 mg by mouth 2 (two) times daily.     Blood Pressure Monitor KIT For daily blood pressure monitoring;  Arm Cuff 1  each 0   carvedilol (COREG) 3.125 MG tablet Take 1 tablet (3.125 mg total) by mouth 2 (two) times daily with a meal. (Patient taking differently: Take 3.125 mg by mouth daily.) 180 tablet 0   escitalopram (LEXAPRO) 5 MG tablet Take 5 mg by mouth daily.     hydrocortisone 1 % lotion Apply 1 Application topically 2 (two) times daily. 118 mL 0   hydrOXYzine (ATARAX) 10 MG tablet Take 1 tablet (10 mg total) by mouth 3 (three) times daily as needed. 30 tablet 0   PROCTO-MED HC 2.5 % rectal cream Apply topically.     triamcinolone cream (KENALOG) 0.1 % Apply 1 Application topically 2 (two) times daily. 80 g 0   No current facility-administered medications for this visit.   Facility-Administered Medications Ordered in Other Visits  Medication Dose Route Frequency Provider Last Rate Last Admin   sodium chloride flush (NS) 0.9 % injection 10 mL  10 mL Intracatheter Once Si Gaul, MD        SURGICAL HISTORY:  Past Surgical History:  Procedure Laterality Date   CORONARY ANGIOPLASTY WITH STENT PLACEMENT  10/30/14   STEMI s/p DES to proximal LAD; residual 80 % proximal and 60% mid LCx diease   IR IMAGING GUIDED PORT INSERTION  03/12/2022   IR PATIENT EVAL TECH 0-60 MINS  10/20/2022   IR PATIENT EVAL TECH 0-60 MINS  10/27/2022   IR PATIENT EVAL TECH 0-60 MINS  10/30/2022   IR PATIENT EVAL TECH 0-60 MINS  10/23/2022   IR PATIENT EVAL TECH 0-60 MINS  11/06/2022   IR PATIENT EVAL TECH 0-60 MINS  11/13/2022   IR PATIENT EVAL TECH 0-60 MINS  12/01/2022   IR RADIOLOGIST EVAL & MGMT  10/02/2022   IR RADIOLOGIST EVAL & MGMT  10/13/2022   IR RADIOLOGIST EVAL & MGMT  10/14/2022   IR RADIOLOGIST EVAL & MGMT  10/17/2022   IR RADIOLOGIST EVAL & MGMT  10/20/2022   IR REMOVAL TUN ACCESS W/ PORT W/O FL MOD SED  10/13/2022   LEFT HEART CATHETERIZATION WITH CORONARY ANGIOGRAM N/A 10/30/2014   Procedure: LEFT HEART CATHETERIZATION WITH CORONARY ANGIOGRAM;  Surgeon: Runell Gess, MD;  Location: [facility] CATH LAB;  Service:  Cardiovascular;  Laterality: N/A;   PERCUTANEOUS CORONARY STENT INTERVENTION (PCI-S)  10/30/2014   Procedure: PERCUTANEOUS CORONARY STENT INTERVENTION (PCI-S);  Surgeon: Runell Gess, MD;  Location: [facility] CATH LAB;  Service: Cardiovascular;;   PROSTATECTOMY     THORACENTESIS Left 12/31/2021   Procedure: THORACENTESIS;  Surgeon: Lorin Glass, MD;  Location: [facility] ENDOSCOPY;  Service: Pulmonary;  Laterality: Left;   TONSILLECTOMY     VIDEO ASSISTED THORACOSCOPY (VATS)/DECORTICATION Left 01/21/2022   Procedure: VIDEO ASSISTED THORACOSCOPY (VATS)/DECORTICATION;  Surgeon: Corliss Skains, MD;  Location: [facility] OR;  Service: Thoracic;  Laterality: Left;    REVIEW OF SYSTEMS:  Constitutional: negative Eyes: negative Ears, nose, mouth, throat,  and face: negative Respiratory: positive for pleurisy/chest pain Cardiovascular: negative Gastrointestinal: negative Genitourinary:negative Integument/breast: negative Hematologic/lymphatic: negative Musculoskeletal:negative Neurological: negative Behavioral/Psych: negative Endocrine: negative Allergic/Immunologic: negative   PHYSICAL EXAMINATION: General appearance: alert, cooperative, and no distress Head: Normocephalic, without obvious abnormality, atraumatic Neck: no adenopathy, no JVD, supple, symmetrical, trachea midline, and thyroid not enlarged, symmetric, no tenderness/mass/nodules Lymph nodes: Cervical, supraclavicular, and axillary nodes normal. Resp: clear to auscultation bilaterally Back: symmetric, no curvature. ROM normal. No CVA tenderness. Cardio: regular rate and rhythm, S1, S2 normal, no murmur, click, rub or gallop GI: soft, non-tender; bowel sounds normal; no masses,  no organomegaly Extremities: extremities normal, atraumatic, no cyanosis or edema Neurologic: Alert and oriented X 3, normal strength and tone. Normal symmetric reflexes. Normal coordination and gait  ECOG PERFORMANCE STATUS: 1 - Symptomatic but completely  ambulatory  Blood pressure (!) 113/46, pulse 63, temperature 97.8 F (36.6 C), temperature source Temporal, resp. rate 17, height 5\' 9"  (1.753 m), weight 147 lb 6.4 oz (66.9 kg), SpO2 99%.  LABORATORY DATA: Lab Results  Component Value Date   WBC 8.6 06/22/2023   HGB 13.1 06/22/2023   HCT 39.3 06/22/2023   MCV 92.0 06/22/2023   PLT 174 06/22/2023      Chemistry      Component Value Date/Time   NA 140 06/22/2023 1140   K 4.8 06/22/2023 1140   CL 107 06/22/2023 1140   CO2 30 06/22/2023 1140   BUN 21 06/22/2023 1140   CREATININE 0.97 06/22/2023 1140      Component Value Date/Time   CALCIUM 9.2 06/22/2023 1140   ALKPHOS 68 06/22/2023 1140   AST 16 06/22/2023 1140   ALT 7 06/22/2023 1140   BILITOT 0.4 06/22/2023 1140       RADIOGRAPHIC STUDIES: CT CHEST ABDOMEN PELVIS W CONTRAST  Result Date: 07/22/2023 CLINICAL DATA:  Small-cell lung cancer staging, possible liver metastases, chemotherapy and immunotherapy complete, ongoing maintenance chemotherapy * Tracking Code: BO * EXAM: CT CHEST, ABDOMEN, AND PELVIS WITH CONTRAST TECHNIQUE: Multidetector CT imaging of the chest, abdomen and pelvis was performed following the standard protocol during bolus administration of intravenous contrast. RADIATION DOSE REDUCTION: This exam was performed according to the departmental dose-optimization program which includes automated exposure control, adjustment of the mA and/or kV according to patient size and/or use of iterative reconstruction technique. CONTRAST:  OMNIPAQUE IOHEXOL 300 MG/ML  SOLN COMPARISON:  04/21/2023 FINDINGS: CT CHEST FINDINGS Cardiovascular: Aortic atherosclerosis. Normal heart size. Three-vessel coronary artery calcifications. No pericardial effusion. Mediastinum/Nodes: Unchanged prominent mediastinal lymph node measuring up to 1.6 x 0.8 cm (series 2, image 20). Thyroid gland, trachea, and esophagus demonstrate no significant findings. Lungs/Pleura: Severe emphysema.  Moderate underlying pulmonary fibrosis in a pattern with apical to basal gradient, featuring irregular peripheral interstitial opacity, septal thickening, subpleural bronchiolectasis and areas of honeycombing at the lung bases. Unchanged subpleural nodule of the lingula measuring 0.6 cm (series 4, image 128). No pleural effusion or pneumothorax. Musculoskeletal: No chest wall abnormality. No acute osseous findings. CT ABDOMEN PELVIS FINDINGS Hepatobiliary: No solid liver abnormality is seen. No gallstones, gallbladder wall thickening, or biliary dilatation. Pancreas: Unremarkable. No pancreatic ductal dilatation or surrounding inflammatory changes. Spleen: Normal in size without significant abnormality. Adrenals/Urinary Tract: Adrenal glands are unremarkable. Nonobstructive calculus of the inferior pole of the right kidney. No left-sided calculi, ureteral calculi, or hydronephrosis. Bladder is unremarkable. Stomach/Bowel: Stomach is within normal limits. Appendix appears normal. No evidence of bowel wall thickening, distention, or inflammatory changes. 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 Interval enlargement of a gastrohepatic ligament lymph node measuring 1.5 x 1.1 cm, previously 1.0 x 0.8 cm (series 2, image 59). Reproductive: Status post prostatectomy. Other: No abdominal wall hernia or abnormality. No ascites. Musculoskeletal: No acute osseous findings. IMPRESSION: 1. Interval enlargement of a gastrohepatic ligament lymph node measuring 1.5 x 1.1 cm, previously 1.0 x 0.8 cm, concerning for nodal metastatic disease. 2. Unchanged prominent pretracheal lymph node without other evidence of mediastinal lymphadenopathy 3. No other evidence of lymphadenopathy or metastatic disease in the chest, abdomen, or pelvis. 4. Unchanged  subpleural nodule of the lingula measuring 0.6 cm, nonspecific. Continued attention on follow-up. 5. Severe emphysema and moderate underlying UIP pattern pulmonary fibrosis. 6. Status post prostatectomy. 7. Nonobstructive right nephrolithiasis. 8. 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 9. Coronary artery disease. Aortic Atherosclerosis (ICD10-I70.0) and Emphysema (ICD10-J43.9). Electronically Signed   By: Jearld Lesch M.D.   On: 07/22/2023 17:07    ASSESSMENT AND PLAN: This is a very pleasant 82 years old white male with Extensive stage (T3, N2, M1a) small cell lung cancer presented with obstructive left upper lobe lung mass in addition to mediastinal lymphadenopathy and malignant left pleural effusion as well as pleural-based metastasis and suspicious liver metastasis diagnosed in June 2023. The patient is currently undergoing systemic chemotherapy with carboplatin for AUC of 4 on day 1, etoposide 80 Mg/M2 on days 1, 2 and 3 with Imfinzi 240 Mg/M2 on the days before chemotherapy and Imfinzi 1500 Mg IV on day 1 every 3 weeks.  Status post 20 cycles.  Starting from cycle #5 he will be on maintenance treatment with immunotherapy with Imfinzi 1500 Mg IV every 4 weeks.  He has been tolerating this treatment fairly well. The patient had repeat CT scan of the chest, abdomen and pelvis performed recently.  I personally and independently reviewed the scan images and discussed the result and showed the images to the patient and his son. His scan showed interval enlargement of a gastrohepatic ligament lymph node measuring 1.5 x 1.1 cm compared to 1.0 x 0.8 cm concerning for nodal metastatic disease.    Extensive Stage Small Cell Lung Cancer Diagnosed in June 2023. Currently on maintenance immunotherapy with Imfinzi (durvalumab) every four weeks. Recent scan shows a suspicious lymph  node near the liver, increased in size from 1.0 x 0.8 cm to 1.5 x 1.1 cm, suggesting potential cancer growth. No new symptoms of dyspnea, hemoptysis, or new cough. Mild intermittent left lower quadrant abdominal pain noted. Weight increased from 145 lbs to 147.4 lbs. Plan to continue Imfinzi and add targeted radiation to the suspicious lymph node. Discussed the option of switching to chemotherapy if more spots develop, but currently, the plan is to continue with Imfinzi and add radiation. Radiation is expected to target the resistant clone while maintaining control over the rest of the body. - Continue Imfinzi (durvalumab) every four weeks - Refer to radiation oncology for targeted radiation to the suspicious lymph node - Schedule next scan in two months to monitor response to radiation - Administer Imfinzi today or schedule for next week - Follow-up in four weeks if Imfinzi is administered today, otherwise follow-up four weeks after next week's dose  Chest Pain Intermittent left-sided chest  pain. Cardiology evaluation in progress with an echocardiogram scheduled to assess for cardiac etiology. No immediate changes to oncology treatment based on this symptom. - Await results of echocardiogram from cardiology  Emphysema No current exacerbation. Radiation planned for abdominal lymph node, not expected to affect lung function. - Monitor for any respiratory symptoms during treatment  Follow-up - Schedule follow-up appointment in four weeks if Imfinzi is administered today, otherwise in five weeks - Monitor for new symptoms or changes in condition.   The patient was advised to call immediately if he has any other concerning symptoms in the interval. The patient voices understanding of current disease status and treatment options and is in agreement with the current care plan.  All questions were answered. The patient knows to call the clinic with any problems, questions or concerns. We can certainly  see the patient much sooner if necessary.  The total time spent in the appointment was 30 minutes.  Disclaimer: This note was dictated with voice recognition software. Similar sounding words can inadvertently be transcribed and may not be corrected upon review.

## 2023-07-23 NOTE — Patient Instructions (Signed)
[facility] - A DEPT OF [facility]  Discharge Instructions: Thank you for choosing [facility] to provide your oncology and hematology care.   If you have a lab appointment with the Cancer Center, please go directly to the Cancer Center and check in at the registration area.   Wear comfortable clothing and clothing appropriate for easy access to any Portacath or PICC line.   We strive to give you quality time with your provider. You may need to reschedule your appointment if you arrive late (15 or more minutes).  Arriving late affects you and other patients whose appointments are after yours.  Also, if you miss three or more appointments without notifying the office, you may be dismissed from the clinic at the provider's discretion.      For prescription refill requests, have your pharmacy contact our office and allow 72 hours for refills to be completed.    Today you received the following chemotherapy and/or immunotherapy agents: Imfinzi      To help prevent nausea and vomiting after your treatment, we encourage you to take your nausea medication as directed.  BELOW ARE SYMPTOMS THAT SHOULD BE REPORTED IMMEDIATELY: *FEVER GREATER THAN 100.4 F (38 C) OR HIGHER *CHILLS OR SWEATING *NAUSEA AND VOMITING THAT IS NOT CONTROLLED WITH YOUR NAUSEA MEDICATION *UNUSUAL SHORTNESS OF BREATH *UNUSUAL BRUISING OR BLEEDING *URINARY PROBLEMS (pain or burning when urinating, or frequent urination) *BOWEL PROBLEMS (unusual diarrhea, constipation, pain near the anus) TENDERNESS IN MOUTH AND THROAT WITH OR WITHOUT PRESENCE OF ULCERS (sore throat, sores in mouth, or a toothache) UNUSUAL RASH, SWELLING OR PAIN  UNUSUAL VAGINAL DISCHARGE OR ITCHING   Items with * indicate a potential emergency and should be followed up as soon as possible or go to the Emergency Department if any problems should occur.  Please show the CHEMOTHERAPY ALERT CARD or IMMUNOTHERAPY  ALERT CARD at check-in to the Emergency Department and triage nurse.  Should you have questions after your visit or need to cancel or reschedule your appointment, please contact [facility] - A DEPT OF [facility]  Dept: [phone]  and follow the prompts.  Office hours are 8:00 a.m. to 4:30 p.m. Monday - Friday. Please note that voicemails left after 4:00 p.m. may not be returned until the following business day.  We are closed weekends and major holidays. You have access to a nurse at all times for urgent questions. Please call the main number to the clinic Dept: [phone] and follow the prompts.   For any non-urgent questions, you may also contact your provider using MyChart. We now offer e-Visits for anyone 80 and older to request care online for non-urgent symptoms. For details visit [email or web address].   Also download the MyChart app! Go to the app store, search "MyChart", open the app, select , and log in with your MyChart username and password.

## 2023-07-26 NOTE — Progress Notes (Signed)
Radiation Oncology         [phone]) [phone] ________________________________  Initial Outpatient Consultation  Name: Pedro Pope. MRN: [id number]  Date: 07/27/2023  DOB: 13-Sep-1940  IO:NGEXB, Jonny Ruiz, MD  Si Gaul, MD   REFERRING PHYSICIAN: Si Gaul, MD  DIAGNOSIS: {There were no encounter diagnoses. (Refresh or delete this SmartLink)}  Extensive stage (T3, N2, M1b) small cell lung cancer presented with obstructive left upper lobe lung mass in addition to mediastinal lymphadenopathy and malignant left pleural effusion as well as pleural-based metastasis as well as suspicious liver metastasis diagnosed in June 2023: s/p chemotherapy now on maintenance immunotherapy with imfinzi   HISTORY OF PRESENT ILLNESS::Pedro Pope. is a 82 y.o. male who is accompanied by ***. he is seen as a courtesy of Dr. Arbutus Ped for an opinion concerning radiation therapy as part of management for his recently diagnosed nodal metastasis near the liver from a left lung cancer primary .   The patient was initially diagnosed with small cell lung cancer of the left upper lobe in June of 2023 after presenting with potential PNA in May of 2023. A chest CT performed in this setting on 12/23/21 showed evidence severe LUL PNA with mucoid impaction and nodular areas also noted in the LUL, and evidence of likely reactive mediastinal and left hilar lymphadenopathy.   A follow-up chest CT was subsequently performed on 01/15/22 which showed: an interval increase in volume of the left pleural effusion, and a new area of extensive pleural thickening and nodularity throughout the let hemithorax. CT also showed complete consolidation and or atelectasis of the left upper lobe sans lingula, with proximal obstruction of the left upper lobe bronchus most consistent with underlying primary lung malignancy, and interval enlargement of pretracheal, AP window, and left hilar lymph nodes suspicious for nodal metastatic  disease.  He was then hospitalized on 01/22/23 for management of his recurrent left sided pleural effusion. He previously underwent thoracentesis on 12/31/21 which was negative for any malignancy, but due to concerns of pleural disease, he underwent pleural biopsies with left pleurx catheter placement for drainage of the left pleural effusion while inpatient on 01/22/23.  Pleural biopsies collected at that time showed malignant cells consistent with small cell carcinoma   He was then referred to Dr. Arbutus Ped and began chemotherapy consisting of carboplatin, durvalumab, and etoposide on 02/05/23. He also had staging work-up including and MRI of the brain and PET scan performed prior to initiating systemic treatment which were negative for intracranial metastatic disease and distant metastatic disease. He completed his 4th and final cycle of chemotherapy on 04/08/22 and transitioned to maintenance immunotherapy consisting of imfinzi on 04/28/22.   Restaging CT CAP on 04/24/22 showed a positive response to therapy with a trace residual left pleural effusion with associated pleural nodularity noted, consistent with metastatic disease, and slight interval improvement of the subpleural consolidations in the left upper and left lower lobes. However, recurrence of a 8 mm lesion in the right hepatic lobe was demonstrated, concerning for metastatic disease.   Despite recurrence of the hepatic lesion, the patient continued on maintenance immunotherapy throughout much of 2024 which he tolerated relatively well.   Restaging CT CAP on 04/21/23 showed no definite evidence of recurrent or metastatic disease in the chest, abdomen, or pelvis, and a stable inferior subpleural lingular nodule measuring 0.6 cm. A stable prominent gastrohepatic ligament lymph node was also demonstrated. No other enlarged lymph nodes in the chest, abdomen or pelvis were demonstrated.    His most  recent restaging CT CAP on 07/20/23 however  demonstrated interval enlargement of a gastrohepatic ligament lymph node measuring 1.5 x 1.1 cm, previously 1.0 x 0.8 cm, concerning for nodal metastatic disease. CT otherwise showed a stable appearing prominent pretracheal lymph node without other evidence of mediastinal lymphadenopathy, and no other evidence of lymphadenopathy or metastatic disease in the chest, abdomen, or pelvis. Stability of the subpleural lingular nodule measuring 6 mm was also demonstrated, which remains nonspecific.   During his most recent visit with Dr. Arbutus Ped on 07/23/23, the patient denied any new symptoms other than mild intermittent left lower quadrant abdominal pain. He has also had a recent weight gain from 145 to 147 lbs.  At this time, Dr. Arbutus Ped recommends that he continue on maintenance imfinzi every [redacted] weeks along with targeted radiation to the enlarging gastrohepatic ligament lymph nod.    PREVIOUS RADIATION THERAPY: Yes   History of prostate cancer diagnosed in 1996 s/p surgery followed by XRT completed in 2007, with radiotherapy induced cystitis and hemorrhage.   PAST MEDICAL HISTORY:  Past Medical History:  Diagnosis Date   Anosmia    CAD (coronary artery disease), native coronary artery    a. 10/29/2013 antlat STEMI s/p DES to pLAD; residual 80 % proximal and 60% mid LCx diease   Chronic headaches    Dressler's syndrome (HCC)    a. placed on colchicine    ED (erectile dysfunction)    Elevated TSH    Fatty liver    Gout    Ischemic cardiomyopathy    a. 2D ECHO 11/04/14 w/ EF 40-45% with LV WMA, G2DD, mild MR, mild LA dilation, mod TR.   PAF (paroxysmal atrial fibrillation) (HCC)    a. Dx on 11/03/14 admission. Not placed on longterm AC due to DAPT w/ receent DES. Will plan for outpt heart monitor    Pre-diabetes    a. HgA1c 6.1 in 10/2014   Prostate cancer [facility])    s/p prostatectomy and XRT   Rheumatic fever 08/18/1945   Small cell lung cancer (HCC) 01/2022    PAST SURGICAL HISTORY: Past  Surgical History:  Procedure Laterality Date   CORONARY ANGIOPLASTY WITH STENT PLACEMENT  10/30/14   STEMI s/p DES to proximal LAD; residual 80 % proximal and 60% mid LCx diease   IR IMAGING GUIDED PORT INSERTION  03/12/2022   IR PATIENT EVAL TECH 0-60 MINS  10/20/2022   IR PATIENT EVAL TECH 0-60 MINS  10/27/2022   IR PATIENT EVAL TECH 0-60 MINS  10/30/2022   IR PATIENT EVAL TECH 0-60 MINS  10/23/2022   IR PATIENT EVAL TECH 0-60 MINS  11/06/2022   IR PATIENT EVAL TECH 0-60 MINS  11/13/2022   IR PATIENT EVAL TECH 0-60 MINS  12/01/2022   IR RADIOLOGIST EVAL & MGMT  10/02/2022   IR RADIOLOGIST EVAL & MGMT  10/13/2022   IR RADIOLOGIST EVAL & MGMT  10/14/2022   IR RADIOLOGIST EVAL & MGMT  10/17/2022   IR RADIOLOGIST EVAL & MGMT  10/20/2022   IR REMOVAL TUN ACCESS W/ PORT W/O FL MOD SED  10/13/2022   LEFT HEART CATHETERIZATION WITH CORONARY ANGIOGRAM N/A 10/30/2014   Procedure: LEFT HEART CATHETERIZATION WITH CORONARY ANGIOGRAM;  Surgeon: Runell Gess, MD;  Location: [facility] CATH LAB;  Service: Cardiovascular;  Laterality: N/A;   PERCUTANEOUS CORONARY STENT INTERVENTION (PCI-S)  10/30/2014   Procedure: PERCUTANEOUS CORONARY STENT INTERVENTION (PCI-S);  Surgeon: Runell Gess, MD;  Location: [facility] CATH LAB;  Service: Cardiovascular;;   PROSTATECTOMY  THORACENTESIS Left 12/31/2021   Procedure: THORACENTESIS;  Surgeon: Lorin Glass, MD;  Location: [facility] ENDOSCOPY;  Service: Pulmonary;  Laterality: Left;   TONSILLECTOMY     VIDEO ASSISTED THORACOSCOPY (VATS)/DECORTICATION Left 01/21/2022   Procedure: VIDEO ASSISTED THORACOSCOPY (VATS)/DECORTICATION;  Surgeon: Corliss Skains, MD;  Location: [facility] OR;  Service: Thoracic;  Laterality: Left;    FAMILY HISTORY:  Family History  Problem Relation Age of Onset   Cancer Other    Coronary artery disease Brother 84       s/p CABG   Diabetes Other    Hyperlipidemia Other    Hypertension Other    Gout Other     SOCIAL HISTORY:  Social History   Tobacco Use    Smoking status: Former    Current packs/day: 0.00    Types: Cigarettes    Quit date: 08/18/1992    Years since quitting: 30.9   Smokeless tobacco: Never  Substance Use Topics   Alcohol use: Not Currently    Comment: social   Drug use: No    ALLERGIES:  Allergies  Allergen Reactions   Penicillins Anaphylaxis   Clindamycin Other (See Comments)   Corn Syrup [Glucose] Other (See Comments)    High fructose corn syrup causes gout   Aspirin Adult Low [Aspirin] Itching and Rash    Dr. Timothy Lasso suspects that long term caused a rash, short term use appears to be ok    MEDICATIONS:  Current Outpatient Medications  Medication Sig Dispense Refill   acetaminophen (TYLENOL) 500 MG tablet Take 500 mg by mouth every 6 (six) hours as needed for mild pain or moderate pain.     albuterol (VENTOLIN HFA) 108 (90 Base) MCG/ACT inhaler Inhale 1-2 puffs into the lungs every 6 (six) hours as needed. 8 g 2   ascorbic acid (VITAMIN C) 500 MG tablet Take 500 mg by mouth 2 (two) times daily.     Blood Pressure Monitor KIT For daily blood pressure monitoring;  Arm Cuff 1 each 0   carvedilol (COREG) 3.125 MG tablet Take 1 tablet (3.125 mg total) by mouth 2 (two) times daily with a meal. (Patient taking differently: Take 3.125 mg by mouth daily.) 180 tablet 0   escitalopram (LEXAPRO) 5 MG tablet Take 5 mg by mouth daily.     hydrocortisone 1 % lotion Apply 1 Application topically 2 (two) times daily. 118 mL 0   hydrOXYzine (ATARAX) 10 MG tablet Take 1 tablet (10 mg total) by mouth 3 (three) times daily as needed. 30 tablet 0   PROCTO-MED HC 2.5 % rectal cream Apply topically.     triamcinolone cream (KENALOG) 0.1 % Apply 1 Application topically 2 (two) times daily. 80 g 0   No current facility-administered medications for this encounter.   Facility-Administered Medications Ordered in Other Encounters  Medication Dose Route Frequency Provider Last Rate Last Admin   sodium chloride flush (NS) 0.9 % injection 10 mL   10 mL Intracatheter Once Si Gaul, MD        REVIEW OF SYSTEMS:  A 10+ POINT REVIEW OF SYSTEMS WAS OBTAINED including neurology, dermatology, psychiatry, cardiac, respiratory, lymph, extremities, GI, GU, musculoskeletal, constitutional, reproductive, HEENT. ***   PHYSICAL EXAM:  vitals were not taken for this visit.   General: Alert and oriented, in no acute distress HEENT: Head is normocephalic. Extraocular movements are intact. Oropharynx is clear. Neck: Neck is supple, no palpable cervical or supraclavicular lymphadenopathy. Heart: Regular in rate and rhythm with no murmurs, rubs, or  gallops. Chest: Clear to auscultation bilaterally, with no rhonchi, wheezes, or rales. Abdomen: Soft, nontender, nondistended, with no rigidity or guarding. Extremities: No cyanosis or edema. Lymphatics: see Neck Exam Skin: No concerning lesions. Musculoskeletal: symmetric strength and muscle tone throughout. Neurologic: Cranial nerves II through XII are grossly intact. No obvious focalities. Speech is fluent. Coordination is intact. Psychiatric: Judgment and insight are intact. Affect is appropriate. ***  ECOG = ***  0 - Asymptomatic (Fully active, able to carry on all predisease activities without restriction)  1 - Symptomatic but completely ambulatory (Restricted in physically strenuous activity but ambulatory and able to carry out work of a light or sedentary nature. For example, light housework, office work)  2 - Symptomatic, <50% in bed during the day (Ambulatory and capable of all self care but unable to carry out any work activities. Up and about more than 50% of waking hours)  3 - Symptomatic, >50% in bed, but not bedbound (Capable of only limited self-care, confined to bed or chair 50% or more of waking hours)  4 - Bedbound (Completely disabled. Cannot carry on any self-care. Totally confined to bed or chair)  5 - Death   Santiago Glad MM, Creech RH, Tormey DC, et al. [phone]). "Toxicity and  response criteria of the [facility]". Am. Evlyn Clines. Oncol. 5 (6): 649-55  LABORATORY DATA:  Lab Results  Component Value Date   WBC 8.0 07/23/2023   HGB 13.0 07/23/2023   HCT 40.2 07/23/2023   MCV 93.3 07/23/2023   PLT 190 07/23/2023   NEUTROABS 5.0 07/23/2023   Lab Results  Component Value Date   NA 144 07/23/2023   K 4.2 07/23/2023   CL 109 07/23/2023   CO2 31 07/23/2023   GLUCOSE 80 07/23/2023   BUN 28 (H) 07/23/2023   CREATININE 1.00 07/23/2023   CALCIUM 9.2 07/23/2023      RADIOGRAPHY: CT CHEST ABDOMEN PELVIS W CONTRAST  Result Date: 07/22/2023 CLINICAL DATA:  Small-cell lung cancer staging, possible liver metastases, chemotherapy and immunotherapy complete, ongoing maintenance chemotherapy * Tracking Code: BO * EXAM: CT CHEST, ABDOMEN, AND PELVIS WITH CONTRAST TECHNIQUE: Multidetector CT imaging of the chest, abdomen and pelvis was performed following the standard protocol during bolus administration of intravenous contrast. RADIATION DOSE REDUCTION: This exam was performed according to the departmental dose-optimization program which includes automated exposure control, adjustment of the mA and/or kV according to patient size and/or use of iterative reconstruction technique. CONTRAST:  OMNIPAQUE IOHEXOL 300 MG/ML  SOLN COMPARISON:  04/21/2023 FINDINGS: CT CHEST FINDINGS Cardiovascular: Aortic atherosclerosis. Normal heart size. Three-vessel coronary artery calcifications. No pericardial effusion. Mediastinum/Nodes: Unchanged prominent mediastinal lymph node measuring up to 1.6 x 0.8 cm (series 2, image 20). Thyroid gland, trachea, and esophagus demonstrate no significant findings. Lungs/Pleura: Severe emphysema. Moderate underlying pulmonary fibrosis in a pattern with apical to basal gradient, featuring irregular peripheral interstitial opacity, septal thickening, subpleural bronchiolectasis and areas of honeycombing at the lung bases. Unchanged  subpleural nodule of the lingula measuring 0.6 cm (series 4, image 128). No pleural effusion or pneumothorax. Musculoskeletal: No chest wall abnormality. No acute osseous findings. CT ABDOMEN PELVIS FINDINGS Hepatobiliary: No solid liver abnormality is seen. No gallstones, gallbladder wall thickening, or biliary dilatation. Pancreas: Unremarkable. No pancreatic ductal dilatation or surrounding inflammatory changes. Spleen: Normal in size without significant abnormality. Adrenals/Urinary Tract: Adrenal glands are unremarkable. Nonobstructive calculus of the inferior pole of the right kidney. No left-sided calculi, ureteral calculi, or hydronephrosis. Bladder is unremarkable. Stomach/Bowel: Stomach  is within normal limits. Appendix appears normal. No evidence of bowel wall thickening, distention, or inflammatory changes. 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 Interval enlargement of a gastrohepatic ligament lymph node measuring 1.5 x 1.1 cm, previously 1.0 x 0.8 cm (series 2, image 59). Reproductive: Status post prostatectomy. Other: No abdominal wall hernia or abnormality. No ascites. Musculoskeletal: No acute osseous findings. IMPRESSION: 1. Interval enlargement of a gastrohepatic ligament lymph node measuring 1.5 x 1.1 cm, previously 1.0 x 0.8 cm, concerning for nodal metastatic disease. 2. Unchanged prominent pretracheal lymph node without other evidence of mediastinal lymphadenopathy 3. No other evidence of lymphadenopathy or metastatic disease in the chest, abdomen, or pelvis. 4. Unchanged subpleural nodule of the lingula measuring 0.6 cm, nonspecific. Continued attention on follow-up. 5. Severe emphysema and moderate underlying UIP pattern pulmonary fibrosis. 6. Status post prostatectomy. 7. Nonobstructive right nephrolithiasis. 8.  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 9. Coronary artery disease. Aortic Atherosclerosis (ICD10-I70.0) and Emphysema (ICD10-J43.9). Electronically Signed   By: Jearld Lesch M.D.   On: 07/22/2023 17:07      IMPRESSION: Extensive stage (T3, N2, M1b) small cell lung cancer presented with obstructive left upper lobe lung mass in addition to mediastinal lymphadenopathy and malignant left pleural effusion as well as pleural-based metastasis as well as suspicious liver metastasis diagnosed in June 2023: s/p chemotherapy now on maintenance immunotherapy with imfinzi   ***  Today, I talked to the patient and family about the findings and work-up thus far.  We discussed the natural history of *** and general treatment, highlighting the role of radiotherapy in the management.  We discussed the available radiation techniques, and focused on the details of logistics and delivery.  We reviewed the anticipated acute and late sequelae associated with radiation in this setting.  The patient was encouraged to ask questions that I answered to the best of my ability. *** A patient consent form was discussed and signed.  We retained a copy for our records.  The patient would like to proceed with radiation and will be scheduled for CT simulation.  PLAN: ***    *** minutes of total time was spent for this patient encounter, including preparation, face-to-face counseling with the patient and coordination of care, physical exam, and documentation of the encounter.   ------------------------------------------------  Billie Lade, PhD, MD  This document serves as a record of services personally performed by Antony Blackbird, MD. It was created on his behalf by Neena Rhymes, a trained medical scribe. The creation of this record is based on the scribe's personal observations and the provider's  statements to them. This document has been checked and approved by the attending provider.

## 2023-07-27 ENCOUNTER — Ambulatory Visit
Admission: RE | Admit: 2023-07-27 | Discharge: 2023-07-27 | Disposition: A | Discharge status: Home / Self Care | Payer: Medicare Other | Source: Ambulatory Visit | Attending: Radiation Oncology | Admitting: Radiation Oncology

## 2023-07-27 ENCOUNTER — Encounter: Payer: Self-pay | Admitting: Radiation Oncology

## 2023-07-27 VITALS — BP 113/56 | HR 57 | Temp 97.7°F | Resp 18 | Ht 69.0 in | Wt 147.5 lb

## 2023-07-27 DIAGNOSIS — I2510 Atherosclerotic heart disease of native coronary artery without angina pectoris: Secondary | ICD-10-CM | POA: Diagnosis not present

## 2023-07-27 DIAGNOSIS — I7102 Dissection of abdominal aorta: Secondary | ICD-10-CM | POA: Insufficient documentation

## 2023-07-27 DIAGNOSIS — I480 Paroxysmal atrial fibrillation: Secondary | ICD-10-CM | POA: Insufficient documentation

## 2023-07-27 DIAGNOSIS — Z923 Personal history of irradiation: Secondary | ICD-10-CM | POA: Insufficient documentation

## 2023-07-27 DIAGNOSIS — C3482 Malignant neoplasm of overlapping sites of left bronchus and lung: Secondary | ICD-10-CM

## 2023-07-27 DIAGNOSIS — I241 Dressler's syndrome: Secondary | ICD-10-CM | POA: Diagnosis not present

## 2023-07-27 DIAGNOSIS — Z79899 Other long term (current) drug therapy: Secondary | ICD-10-CM | POA: Insufficient documentation

## 2023-07-27 DIAGNOSIS — Z87891 Personal history of nicotine dependence: Secondary | ICD-10-CM | POA: Insufficient documentation

## 2023-07-27 DIAGNOSIS — I700 Atherosclerosis of aorta: Secondary | ICD-10-CM | POA: Insufficient documentation

## 2023-07-27 DIAGNOSIS — J910 Malignant pleural effusion: Secondary | ICD-10-CM | POA: Diagnosis not present

## 2023-07-27 DIAGNOSIS — C787 Secondary malignant neoplasm of liver and intrahepatic bile duct: Secondary | ICD-10-CM | POA: Insufficient documentation

## 2023-07-27 DIAGNOSIS — N200 Calculus of kidney: Secondary | ICD-10-CM | POA: Insufficient documentation

## 2023-07-27 DIAGNOSIS — Z8546 Personal history of malignant neoplasm of prostate: Secondary | ICD-10-CM | POA: Diagnosis not present

## 2023-07-27 DIAGNOSIS — I255 Ischemic cardiomyopathy: Secondary | ICD-10-CM | POA: Diagnosis not present

## 2023-07-27 DIAGNOSIS — C782 Secondary malignant neoplasm of pleura: Secondary | ICD-10-CM | POA: Diagnosis not present

## 2023-07-27 DIAGNOSIS — C772 Secondary and unspecified malignant neoplasm of intra-abdominal lymph nodes: Secondary | ICD-10-CM | POA: Diagnosis not present

## 2023-07-27 DIAGNOSIS — J439 Emphysema, unspecified: Secondary | ICD-10-CM | POA: Diagnosis not present

## 2023-07-27 DIAGNOSIS — N529 Male erectile dysfunction, unspecified: Secondary | ICD-10-CM | POA: Insufficient documentation

## 2023-07-27 DIAGNOSIS — K760 Fatty (change of) liver, not elsewhere classified: Secondary | ICD-10-CM | POA: Insufficient documentation

## 2023-07-27 DIAGNOSIS — J84112 Idiopathic pulmonary fibrosis: Secondary | ICD-10-CM | POA: Insufficient documentation

## 2023-07-27 NOTE — Progress Notes (Incomplete)
Location of tumor and Histology per Pathology Report: ***  Biopsy:   FINAL MICROSCOPIC DIAGNOSIS:  A. PLEURA, LEFT, BIOPSY: - Small cell carcinoma, see comment   COMMENT:  Dr. Kenyon Ana reviewed the case and concurs with the above diagnosis.  INTRAOPERATIVE DIAGNOSIS:  A.  Left pleural biopsy: "Histologically undifferentiated malignancy. Material held for flow cytometry." Intraoperative diagnosis rendered by Dr. Unknown Foley at 2:50 PM on 01/21/2022.  GROSS DESCRIPTION:  The specimen is received fresh for rapid intraoperative consultation and consists of a 3.3 x 2.1 x 1.0 cm aggregate of tan, glistening soft tissue.  Representative portion is submitted for frozen section analysis, and additional tissue is placed in RPMI for possible flow cytometry.  The remaining tissue is entirely submitted in 4 cassettes. 1 = frozen section remnant 2-4 = remainder of tissue Lovey Newcomer 01/21/2022)   Final Diagnosis performed by Holley Bouche, MD.   Electronically signed 01/22/2022 Technical component performed at [facility]. [facility], [address] [place], [place] [address].  Professional component performed at [facility], [address], [place], [place] [address].  Immunohistochemistry Technical component (if applicable) was performed at [facility]. [address], [address], [place], [place] [address].   IMMUNOHISTOCHEMISTRY DISCLAIMER (if applicable): Some of these immunohistochemical stains may have been developed and the performance characteristics determine by [facility]. Some may not have been cleared or approved by the U.S. Food and Drug Administration. The FDA has determined that such clearance or approval is not necessary. This test is used for clinical purposes. It should not be regarded as investigational or for research. This laboratory is certified under the Clinical Laboratory Improvement Amendments of 1988 (CLIA-88) as  qualified to perform high complexity clinical laboratory testing.  The controls stained appropriately.  Past/Anticipated interventions by surgeon, if any:   Past/Anticipated interventions by medical oncology, if any: Chemotherapy   - Continue Imfinzi (durvalumab) every four weeks - Refer to radiation oncology for targeted radiation to the suspicious lymph node - Schedule next scan in two months to monitor response to radiation - Administer Imfinzi today or schedule for next week - Follow-up in four weeks if Imfinzi is administered today, otherwise follow-up four weeks after next week's dose  Follow-up - Schedule follow-up appointment in four weeks if Imfinzi is administered today, otherwise in five weeks - Monitor for new symptoms or changes in condition.   The patient was advised to call immediately if he has any other concerning symptoms in the interval. The patient voices understanding of current disease status and treatment options and is in agreement with the current care plan.    Pain issues, if any:  {:18581} {PAIN DESCRIPTION:[id number]}  SAFETY ISSUES: Prior radiation? {:18581} Pacemaker/ICD? {:18581} Possible current pregnancy? no Is the patient on methotrexate? {:18581}  Current Complaints / other details:  ***     ***

## 2023-07-27 NOTE — Progress Notes (Addendum)
Location of tumor and Histology per Pathology Report: Left Lung   Biopsy:    FINAL MICROSCOPIC DIAGNOSIS:  A. PLEURA, LEFT, BIOPSY: - Small cell carcinoma, see comment   COMMENT:  Dr. Kenyon Ana reviewed the case and concurs with the above diagnosis.  INTRAOPERATIVE DIAGNOSIS:  A.  Left pleural biopsy: "Histologically undifferentiated malignancy. Material held for flow cytometry." Intraoperative diagnosis rendered by Dr. Unknown Foley at 2:50 PM on 01/21/2022.  GROSS DESCRIPTION:  The specimen is received fresh for rapid intraoperative consultation and consists of a 3.3 x 2.1 x 1.0 cm aggregate of tan, glistening soft tissue.  Representative portion is submitted for frozen section analysis, and additional tissue is placed in RPMI for possible flow cytometry.  The remaining tissue is entirely submitted in 4 cassettes. 1 = frozen section remnant 2-4 = remainder of tissue Lovey Newcomer 01/21/2022)   Final Diagnosis performed by Holley Bouche, MD.   Electronically signed 01/22/2022 Technical component performed at [facility]. [facility], [address], [place], [place] [address].  Professional component performed at [facility], [address], [place], [place] [address].  Immunohistochemistry Technical component (if applicable) was performed at [facility]. [address], [address], [place], [place] [address].   IMMUNOHISTOCHEMISTRY DISCLAIMER (if applicable): Some of these immunohistochemical stains may have been developed and the performance characteristics determine by [facility]. Some may not have been cleared or approved by the U.S. Food and Drug Administration. The FDA has determined that such clearance or approval is not necessary. This test is used for clinical purposes. It should not be regarded as investigational or for research. This laboratory is certified under the Clinical Laboratory Improvement Amendments of  1988 (CLIA-88) as qualified to perform high complexity clinical laboratory testing.  The controls stained appropriately.  Past/Anticipated interventions by surgeon, if any:    Past/Anticipated interventions by medical oncology, if any: Chemotherapy    - Continue Imfinzi (durvalumab) every four weeks - Refer to radiation oncology for targeted radiation to the suspicious lymph node - Schedule next scan in two months to monitor response to radiation - Administer Imfinzi today or schedule for next week - Follow-up in four weeks if Imfinzi is administered today, otherwise follow-up four weeks after next week's dose   Follow-up - Schedule follow-up appointment in four weeks if Imfinzi is administered today, otherwise in five weeks - Monitor for new symptoms or changes in condition.   The patient was advised to call immediately if he has any other concerning symptoms in the interval. The patient voices understanding of current disease status and treatment options and is in agreement with the current care plan.     Pain issues, if any:  None    SAFETY ISSUES: Prior radiation? Yes, he reports having radiation at [facility] in 2007. Pacemaker/ICD? No Possible current pregnancy? N/a Is the patient on methotrexate? No       BP (!) 113/56 (BP Location: Right Arm, Patient Position: Sitting)   Pulse (!) 57   Temp 97.7 F (36.5 C) (Temporal)   Resp 18   Ht 5\' 9"  (1.753 m)   Wt 147 lb 8 oz (66.9 kg)   SpO2 99%   BMI 21.78 kg/m

## 2023-07-30 ENCOUNTER — Ambulatory Visit
Admission: RE | Admit: 2023-07-30 | Discharge: 2023-07-30 | Disposition: A | Discharge status: Home / Self Care | Payer: Medicare Other | Source: Ambulatory Visit | Attending: Radiation Oncology | Admitting: Radiation Oncology

## 2023-07-30 DIAGNOSIS — Z87891 Personal history of nicotine dependence: Secondary | ICD-10-CM | POA: Diagnosis not present

## 2023-07-30 DIAGNOSIS — C3482 Malignant neoplasm of overlapping sites of left bronchus and lung: Secondary | ICD-10-CM | POA: Diagnosis not present

## 2023-07-30 DIAGNOSIS — Z510 Encounter for antineoplastic radiation therapy: Secondary | ICD-10-CM | POA: Insufficient documentation

## 2023-07-30 DIAGNOSIS — C772 Secondary and unspecified malignant neoplasm of intra-abdominal lymph nodes: Secondary | ICD-10-CM | POA: Diagnosis not present

## 2023-07-31 ENCOUNTER — Ambulatory Visit: Payer: Medicare Other | Admitting: Internal Medicine

## 2023-08-04 ENCOUNTER — Ambulatory Visit (HOSPITAL_COMMUNITY): Payer: Medicare Other | Attending: Cardiovascular Disease

## 2023-08-04 DIAGNOSIS — I2109 ST elevation (STEMI) myocardial infarction involving other coronary artery of anterior wall: Secondary | ICD-10-CM | POA: Diagnosis not present

## 2023-08-04 DIAGNOSIS — I2510 Atherosclerotic heart disease of native coronary artery without angina pectoris: Secondary | ICD-10-CM | POA: Insufficient documentation

## 2023-08-04 MED ORDER — PERFLUTREN LIPID MICROSPHERE
1.0000 mL | INTRAVENOUS | Status: AC | PRN
  Administered 2023-08-04: 1 mL via INTRAVENOUS

## 2023-08-05 LAB — ECHOCARDIOGRAM COMPLETE
Area-P 1/2: 2.59 cm2
S' Lateral: 3.1 cm

## 2023-08-06 ENCOUNTER — Ambulatory Visit: Payer: Medicare Other | Admitting: Radiation Oncology

## 2023-08-06 DIAGNOSIS — Z510 Encounter for antineoplastic radiation therapy: Secondary | ICD-10-CM | POA: Diagnosis not present

## 2023-08-06 DIAGNOSIS — Z87891 Personal history of nicotine dependence: Secondary | ICD-10-CM | POA: Diagnosis not present

## 2023-08-06 DIAGNOSIS — C772 Secondary and unspecified malignant neoplasm of intra-abdominal lymph nodes: Secondary | ICD-10-CM | POA: Diagnosis not present

## 2023-08-06 DIAGNOSIS — C3482 Malignant neoplasm of overlapping sites of left bronchus and lung: Secondary | ICD-10-CM | POA: Diagnosis not present

## 2023-08-07 ENCOUNTER — Ambulatory Visit: Payer: Medicare Other

## 2023-08-07 ENCOUNTER — Other Ambulatory Visit: Payer: Self-pay | Admitting: Cardiovascular Disease

## 2023-08-09 ENCOUNTER — Other Ambulatory Visit: Payer: Self-pay

## 2023-08-10 ENCOUNTER — Ambulatory Visit: Payer: Medicare Other | Admitting: Radiation Oncology

## 2023-08-10 ENCOUNTER — Ambulatory Visit: Payer: Medicare Other

## 2023-08-11 ENCOUNTER — Ambulatory Visit: Payer: Medicare Other | Admitting: Radiation Oncology

## 2023-08-11 ENCOUNTER — Ambulatory Visit: Payer: Medicare Other

## 2023-08-13 ENCOUNTER — Ambulatory Visit: Payer: Medicare Other | Admitting: Radiation Oncology

## 2023-08-13 ENCOUNTER — Ambulatory Visit: Payer: Medicare Other

## 2023-08-14 ENCOUNTER — Other Ambulatory Visit: Payer: Self-pay

## 2023-08-14 ENCOUNTER — Ambulatory Visit
Admission: RE | Admit: 2023-08-14 | Discharge: 2023-08-14 | Disposition: A | Discharge status: Home / Self Care | Payer: Medicare Other | Source: Ambulatory Visit | Attending: Radiation Oncology | Admitting: Radiation Oncology

## 2023-08-14 ENCOUNTER — Ambulatory Visit: Payer: Medicare Other

## 2023-08-14 ENCOUNTER — Ambulatory Visit: Payer: Medicare Other | Admitting: Radiation Oncology

## 2023-08-14 DIAGNOSIS — C3482 Malignant neoplasm of overlapping sites of left bronchus and lung: Secondary | ICD-10-CM | POA: Diagnosis not present

## 2023-08-14 DIAGNOSIS — Z510 Encounter for antineoplastic radiation therapy: Secondary | ICD-10-CM | POA: Diagnosis not present

## 2023-08-14 LAB — RAD ONC ARIA SESSION SUMMARY
Course Elapsed Days: 0
Plan Fractions Treated to Date: 1
Plan Prescribed Dose Per Fraction: 8 Gy
Plan Total Fractions Prescribed: 5
Plan Total Prescribed Dose: 40 Gy
Reference Point Dosage Given to Date: 8 Gy
Reference Point Session Dosage Given: 8 Gy
Session Number: 1

## 2023-08-17 ENCOUNTER — Ambulatory Visit
Admission: RE | Admit: 2023-08-17 | Discharge: 2023-08-17 | Disposition: A | Discharge status: Home / Self Care | Payer: Medicare Other | Source: Ambulatory Visit | Attending: Radiation Oncology | Admitting: Radiation Oncology

## 2023-08-17 ENCOUNTER — Ambulatory Visit: Payer: Medicare Other

## 2023-08-17 ENCOUNTER — Other Ambulatory Visit: Payer: Self-pay

## 2023-08-17 DIAGNOSIS — C3482 Malignant neoplasm of overlapping sites of left bronchus and lung: Secondary | ICD-10-CM | POA: Diagnosis not present

## 2023-08-17 DIAGNOSIS — Z510 Encounter for antineoplastic radiation therapy: Secondary | ICD-10-CM | POA: Diagnosis not present

## 2023-08-17 LAB — RAD ONC ARIA SESSION SUMMARY
Course Elapsed Days: 3
Plan Fractions Treated to Date: 2
Plan Prescribed Dose Per Fraction: 8 Gy
Plan Total Fractions Prescribed: 5
Plan Total Prescribed Dose: 40 Gy
Reference Point Dosage Given to Date: 16 Gy
Reference Point Session Dosage Given: 8 Gy
Session Number: 2

## 2023-08-18 ENCOUNTER — Ambulatory Visit: Payer: Medicare Other

## 2023-08-18 ENCOUNTER — Inpatient Hospital Stay: Payer: Medicare Other

## 2023-08-18 ENCOUNTER — Inpatient Hospital Stay (HOSPITAL_BASED_OUTPATIENT_CLINIC_OR_DEPARTMENT_OTHER): Payer: Medicare Other | Admitting: Internal Medicine

## 2023-08-18 VITALS — BP 129/56 | HR 56 | Temp 98.0°F | Resp 16 | Wt 148.0 lb

## 2023-08-18 VITALS — BP 122/68 | HR 58 | Resp 16

## 2023-08-18 DIAGNOSIS — C3482 Malignant neoplasm of overlapping sites of left bronchus and lung: Secondary | ICD-10-CM

## 2023-08-18 DIAGNOSIS — C787 Secondary malignant neoplasm of liver and intrahepatic bile duct: Secondary | ICD-10-CM | POA: Diagnosis not present

## 2023-08-18 DIAGNOSIS — C3412 Malignant neoplasm of upper lobe, left bronchus or lung: Secondary | ICD-10-CM | POA: Diagnosis not present

## 2023-08-18 DIAGNOSIS — Z5112 Encounter for antineoplastic immunotherapy: Secondary | ICD-10-CM | POA: Diagnosis not present

## 2023-08-18 DIAGNOSIS — C782 Secondary malignant neoplasm of pleura: Secondary | ICD-10-CM | POA: Diagnosis not present

## 2023-08-18 LAB — CMP (CANCER CENTER ONLY)
ALT: 7 U/L (ref 0–44)
AST: 17 U/L (ref 15–41)
Albumin: 3.9 g/dL (ref 3.5–5.0)
Alkaline Phosphatase: 69 U/L (ref 38–126)
Anion gap: 4 — ABNORMAL LOW (ref 5–15)
BUN: 21 mg/dL (ref 8–23)
CO2: 30 mmol/L (ref 22–32)
Calcium: 9.3 mg/dL (ref 8.9–10.3)
Chloride: 106 mmol/L (ref 98–111)
Creatinine: 0.94 mg/dL (ref 0.61–1.24)
GFR, Estimated: >60 mL/min (ref >60)
Glucose, Bld: 82 mg/dL (ref 70–99)
Potassium: 4.9 mmol/L (ref 3.5–5.1)
Sodium: 140 mmol/L (ref 135–145)
Total Bilirubin: 0.5 mg/dL (ref 0.0–1.2)
Total Protein: 6.7 g/dL (ref 6.5–8.1)

## 2023-08-18 LAB — CBC WITH DIFFERENTIAL (CANCER CENTER ONLY)
Abs Immature Granulocytes: 0.03 10*3/uL (ref 0.00–0.07)
Basophils Absolute: 0 10*3/uL (ref 0.0–0.1)
Basophils Relative: 0 %
Eosinophils Absolute: 0.4 10*3/uL (ref 0.0–0.5)
Eosinophils Relative: 5 %
HCT: 40.8 % (ref 39.0–52.0)
Hemoglobin: 13.1 g/dL (ref 13.0–17.0)
Immature Granulocytes: 0 %
Lymphocytes Relative: 16 %
Lymphs Abs: 1.4 10*3/uL (ref 0.7–4.0)
MCH: 30 pg (ref 26.0–34.0)
MCHC: 32.1 g/dL (ref 30.0–36.0)
MCV: 93.4 fL (ref 80.0–100.0)
Monocytes Absolute: 1.1 10*3/uL — ABNORMAL HIGH (ref 0.1–1.0)
Monocytes Relative: 13 %
Neutro Abs: 5.5 10*3/uL (ref 1.7–7.7)
Neutrophils Relative %: 66 %
Platelet Count: 153 10*3/uL (ref 150–400)
RBC: 4.37 MIL/uL (ref 4.22–5.81)
RDW: 13.7 % (ref 11.5–15.5)
WBC Count: 8.4 10*3/uL (ref 4.0–10.5)
nRBC: 0 % (ref 0.0–0.2)

## 2023-08-18 MED ORDER — SODIUM CHLORIDE 0.9% FLUSH: 10.0000 mL | INTRAVENOUS | Status: DC | PRN

## 2023-08-18 MED ORDER — DURVALUMAB 500 MG/10ML IV SOLN
1500.0000 mg | Freq: Once | INTRAVENOUS | Status: AC
  Administered 2023-08-18: 1500 mg via INTRAVENOUS
  Filled 2023-08-18: qty 30

## 2023-08-18 MED ORDER — HEPARIN SOD (PORK) LOCK FLUSH 100 UNIT/ML IV SOLN: 500.0000 [IU] | Freq: Once | INTRAVENOUS | Status: DC | PRN

## 2023-08-18 MED ORDER — SODIUM CHLORIDE 0.9 % IV SOLN: Freq: Once | INTRAVENOUS | Status: AC

## 2023-08-18 NOTE — Patient Instructions (Signed)

## 2023-08-18 NOTE — Progress Notes (Signed)
 [facility] [phone]   Fax:([phone]  OFFICE PROGRESS NOTE  [name], MD [address] [place] [address]  DIAGNOSIS: Extensive stage (T3, N2, M1b) small cell lung cancer presented with obstructive left upper lobe lung mass in addition to mediastinal lymphadenopathy and malignant left pleural effusion as well as pleural-based metastasis as well as suspicious liver metastasis diagnosed in June 2023.  PRIOR THERAPY: None  CURRENT THERAPY:  1) Palliative systemic chemotherapy with carboplatin  initiated for AUC of 4 on day 1 and etoposide  80 Mg/M2 on days 1, 2 and 3 with Cosela  before the chemotherapy and Imfinzi  1500 Mg IV on day 1 every 3 weeks.  Status post 21 cycles.  Starting from cycle #5 the patient will be on maintenance treatment with Imfinzi  1500 Mg IV every 4 weeks.  2) palliative radiotherapy to enlarging gastrohepatic ligament lymph node under the care of Dr. Shannon expected to be completed on August 26, 2023.  INTERVAL HISTORY: Cong Hightower. 82 y.o. male returns to the clinic today for follow-up visit accompanied by his wife.Discussed the use of AI scribe software for clinical note transcription with the patient, who gave verbal consent to proceed.  History of Present Illness   The patient, an 82 year old diagnosed with extensive stage small cell lung cancer in June 2023, has been on a regimen of carboplatin , etoposide , and Imfinzi  for the initial four cycles. Since the fifth cycle, he has been on maintenance Imfinzi , totaling twenty-two cycles to date.  Recently, an enlarged lymph node was detected in the gastrohepatic ligament, for which he has received two sessions of stereotactic body radiation therapy (SBRT) out of a planned five. The patient reports experiencing fatigue following each radiation session.  In terms of the ongoing immunotherapy, the patient reports no new issues. However, he has developed a rash on the right  frontal area of his face and neck, which he has been managing with hydrocortisone  cream, albeit inconsistently.  The patient has also been experiencing a persistent cough, producing mucus that varies in color from yellow to white. Despite this, he denies having a fever.       MEDICAL HISTORY: Past Medical History:  Diagnosis Date   Anosmia    CAD (coronary artery disease), native coronary artery    a. 10/29/2013 antlat STEMI s/p DES to pLAD; residual 80 % proximal and 60% mid LCx diease   Chronic headaches    Dressler's syndrome (HCC)    a. placed on colchicine     ED (erectile dysfunction)    Elevated TSH    Fatty liver    Gout    Ischemic cardiomyopathy    a. 2D ECHO 11/04/14 w/ EF 40-45% with LV WMA, G2DD, mild MR, mild LA dilation, mod TR.   PAF (paroxysmal atrial fibrillation) (HCC)    a. Dx on 11/03/14 admission. Not placed on longterm AC due to DAPT w/ receent DES. Will plan for outpt heart monitor    Pre-diabetes    a. HgA1c 6.1 in 10/2014   Prostate cancer [facility])    s/p prostatectomy and XRT   Rheumatic fever [birth year]/1945   Small cell lung cancer (HCC) 01/2022    ALLERGIES:  is allergic to penicillins, clindamycin, corn syrup [glucose], and aspirin  adult low [aspirin ].  MEDICATIONS:  Current Outpatient Medications  Medication Sig Dispense Refill   acetaminophen  (TYLENOL ) 500 MG tablet Take 500 mg by mouth every 6 (six) hours as needed for mild pain or moderate pain.  albuterol  (VENTOLIN  HFA) 108 (90 Base) MCG/ACT inhaler Inhale 1-2 puffs into the lungs every 6 (six) hours as needed. (Patient not taking: Reported on 07/27/2023) 8 g 2   ascorbic acid  (VITAMIN C ) 500 MG tablet Take 500 mg by mouth 2 (two) times daily.     Blood Pressure Monitor KIT For daily blood pressure monitoring;  Arm Cuff 1 each 0   carvedilol  (COREG ) 3.125 MG tablet TAKE 1 TABLET(3.125 MG) BY MOUTH TWICE DAILY WITH A MEAL 180 tablet 3   escitalopram (LEXAPRO) 5 MG tablet Take 5 mg by mouth daily.      hydrocortisone  1 % lotion Apply 1 Application topically 2 (two) times daily. 118 mL 0   hydrOXYzine  (ATARAX ) 10 MG tablet Take 1 tablet (10 mg total) by mouth 3 (three) times daily as needed. (Patient not taking: Reported on 07/27/2023) 30 tablet 0   PROCTO-MED HC  2.5 % rectal cream Apply topically.     triamcinolone  cream (KENALOG ) 0.1 % Apply 1 Application topically 2 (two) times daily. 80 g 0   No current facility-administered medications for this visit.   Facility-Administered Medications Ordered in Other Visits  Medication Dose Route Frequency Provider Last Rate Last Admin   sodium chloride  flush (NS) 0.9 % injection 10 mL  10 mL Intracatheter Once Sherrod Sherrod, MD        SURGICAL HISTORY:  Past Surgical History:  Procedure Laterality Date   CORONARY ANGIOPLASTY WITH STENT PLACEMENT  10/30/14   STEMI s/p DES to proximal LAD; residual 80 % proximal and 60% mid LCx diease   IR IMAGING GUIDED PORT INSERTION  03/12/2022   IR PATIENT EVAL TECH 0-60 MINS  10/20/2022   IR PATIENT EVAL TECH 0-60 MINS  10/27/2022   IR PATIENT EVAL TECH 0-60 MINS  10/30/2022   IR PATIENT EVAL TECH 0-60 MINS  10/23/2022   IR PATIENT EVAL TECH 0-60 MINS  11/06/2022   IR PATIENT EVAL TECH 0-60 MINS  11/13/2022   IR PATIENT EVAL TECH 0-60 MINS  12/01/2022   IR RADIOLOGIST EVAL & MGMT  10/02/2022   IR RADIOLOGIST EVAL & MGMT  10/13/2022   IR RADIOLOGIST EVAL & MGMT  10/14/2022   IR RADIOLOGIST EVAL & MGMT  10/17/2022   IR RADIOLOGIST EVAL & MGMT  10/20/2022   IR REMOVAL TUN ACCESS W/ PORT W/O FL MOD SED  10/13/2022   LEFT HEART CATHETERIZATION WITH CORONARY ANGIOGRAM N/A 10/30/2014   Procedure: LEFT HEART CATHETERIZATION WITH CORONARY ANGIOGRAM;  Surgeon: Dorn JINNY Lesches, MD;  Location: [facility] CATH LAB;  Service: Cardiovascular;  Laterality: N/A;   PERCUTANEOUS CORONARY STENT INTERVENTION (PCI-S)  10/30/2014   Procedure: PERCUTANEOUS CORONARY STENT INTERVENTION (PCI-S);  Surgeon: Dorn JINNY Lesches, MD;  Location: [facility] CATH LAB;   Service: Cardiovascular;;   PROSTATECTOMY     THORACENTESIS Left 12/31/2021   Procedure: THORACENTESIS;  Surgeon: Claudene Toribio BROCKS, MD;  Location: [facility] ENDOSCOPY;  Service: Pulmonary;  Laterality: Left;   TONSILLECTOMY     VIDEO ASSISTED THORACOSCOPY (VATS)/DECORTICATION Left 01/21/2022   Procedure: VIDEO ASSISTED THORACOSCOPY (VATS)/DECORTICATION;  Surgeon: Shyrl Linnie KIDD, MD;  Location: [facility] OR;  Service: Thoracic;  Laterality: Left;    REVIEW OF SYSTEMS:  Constitutional: negative Eyes: negative Ears, nose, mouth, throat, and face: negative Respiratory: positive for cough and sputum Cardiovascular: negative Gastrointestinal: negative Genitourinary:negative Integument/breast: positive for rash Hematologic/lymphatic: negative Musculoskeletal:negative Neurological: negative Behavioral/Psych: negative Endocrine: negative Allergic/Immunologic: negative   PHYSICAL EXAMINATION: General appearance: alert, cooperative, and no distress Head: Normocephalic, without obvious  abnormality, atraumatic Neck: no adenopathy, no JVD, supple, symmetrical, trachea midline, and thyroid  not enlarged, symmetric, no tenderness/mass/nodules Lymph nodes: Cervical, supraclavicular, and axillary nodes normal. Resp: clear to auscultation bilaterally Back: symmetric, no curvature. ROM normal. No CVA tenderness. Cardio: regular rate and rhythm, S1, S2 normal, no murmur, click, rub or gallop GI: soft, non-tender; bowel sounds normal; no masses,  no organomegaly Extremities: extremities normal, atraumatic, no cyanosis or edema  ECOG PERFORMANCE STATUS: 1 - Symptomatic but completely ambulatory  Blood pressure (!) 129/56, pulse (!) 56, temperature 98 F (36.7 C), temperature source Tympanic, resp. rate 16, weight 148 lb (67.1 kg), SpO2 96%.  LABORATORY DATA: Lab Results  Component Value Date   WBC 8.4 08/18/2023   HGB 13.1 08/18/2023   HCT 40.8 08/18/2023   MCV 93.4 08/18/2023   PLT 153 08/18/2023       Chemistry      Component Value Date/Time   NA 144 07/23/2023 0931   K 4.2 07/23/2023 0931   CL 109 07/23/2023 0931   CO2 31 07/23/2023 0931   BUN 28 (H) 07/23/2023 0931   CREATININE 1.00 07/23/2023 0931      Component Value Date/Time   CALCIUM  9.2 07/23/2023 0931   ALKPHOS 63 07/23/2023 0931   AST 18 07/23/2023 0931   ALT 9 07/23/2023 0931   BILITOT 0.4 07/23/2023 0931       RADIOGRAPHIC STUDIES: ECHOCARDIOGRAM COMPLETE Result Date: 08/05/2023    ECHOCARDIOGRAM REPORT   Patient Name:   Jakarri Lesko. Date of Exam: 08/04/2023 Medical Rec #:  [id number]          Height:       69.0 in Accession #:    [id number]         Weight:       147.5 lb Date of Birth:  [birth year]          BSA:          1.815 m Patient Age:    82 years           BP:           118/57 mmHg Patient Gender: M                  HR:           60 bpm. Exam Location:  [address] Procedure: 2D Echo and 3D Echo Indications:    I48.91 Atrial Fibrillation  History:        Patient has prior history of Echocardiogram examinations, most                 recent 02/25/2022. Ischemic Cardiomyopathy, STEMI, Lung cancer/                 Chemotherapy; Risk Factors:HLD.  Sonographer:    Waldo Guadalajara RCS Referring Phys: [id number] JONATHAN J BERRY IMPRESSIONS  1. Left ventricular ejection fraction, by estimation, is 45 to 50%. Left ventricular ejection fraction by 3D volume is 58 %. The left ventricle has mildly decreased function. The left ventricle demonstrates regional wall motion abnormalities (see scoring diagram/findings for description). Left ventricular diastolic parameters are consistent with Grade I diastolic dysfunction (impaired relaxation).  2. Right ventricular systolic function is normal. The right ventricular size is normal.  3. The mitral valve is normal in structure. No evidence of mitral valve regurgitation. No evidence of mitral stenosis.  4. The aortic valve is tricuspid. There is severe calcifcation of the aortic valve. There is  moderate thickening  of the aortic valve. Aortic valve regurgitation is not visualized. Aortic valve sclerosis/calcification is present, without any evidence of aortic stenosis.  5. The inferior vena cava is normal in size with greater than 50% respiratory variability, suggesting right atrial pressure of 3 mmHg. Comparison(s): No significant change from prior study. Prior images reviewed side by side. 02/25/22: GLS: - 14.8, EF 45-50%. FINDINGS  Left Ventricle: Left ventricular ejection fraction, by estimation, is 45 to 50%. Left ventricular ejection fraction by 3D volume is 58 %. The left ventricle has mildly decreased function. The left ventricle demonstrates regional wall motion abnormalities. The left ventricular internal cavity size was normal in size. There is no left ventricular hypertrophy. Left ventricular diastolic parameters are consistent with Grade I diastolic dysfunction (impaired relaxation).  LV Wall Scoring: The mid inferoseptal segment and mid inferior segment are akinetic. Right Ventricle: The right ventricular size is normal. No increase in right ventricular wall thickness. Right ventricular systolic function is normal. Left Atrium: Left atrial size was normal in size. Right Atrium: Right atrial size was normal in size. Pericardium: There is no evidence of pericardial effusion. Mitral Valve: The mitral valve is normal in structure. No evidence of mitral valve regurgitation. No evidence of mitral valve stenosis. Tricuspid Valve: The tricuspid valve is normal in structure. Tricuspid valve regurgitation is not demonstrated. No evidence of tricuspid stenosis. Aortic Valve: The aortic valve is tricuspid. There is severe calcifcation of the aortic valve. There is moderate thickening of the aortic valve. Aortic valve regurgitation is not visualized. Aortic valve sclerosis/calcification is present, without any evidence of aortic stenosis. Pulmonic Valve: The pulmonic valve was normal in structure. Pulmonic  valve regurgitation is not visualized. No evidence of pulmonic stenosis. Aorta: The aortic root is normal in size and structure. Venous: The inferior vena cava is normal in size with greater than 50% respiratory variability, suggesting right atrial pressure of 3 mmHg. IAS/Shunts: No atrial level shunt detected by color flow Doppler.  LEFT VENTRICLE PLAX 2D LVIDd:         4.40 cm         Diastology LVIDs:         3.10 cm         LV e' medial:   5.44 cm/s LV PW:         0.90 cm         LV E/e' medial: 15.3 LV IVS:        0.90 cm LVOT diam:     1.90 cm LV SV:         76              3D Volume EF LV SV Index:   42              LV 3D EF:    Left LVOT Area:     2.84 cm                     ventricul                                             ar  ejection                                             fraction                                             by 3D                                             volume is                                             58 %.                                 3D Volume EF:                                3D EF:        58 %                                LV EDV:       125 ml                                LV ESV:       52 ml                                LV SV:        73 ml RIGHT VENTRICLE RV Basal diam:  3.50 cm RV S prime:     15.20 cm/s TAPSE (M-mode): 2.2 cm LEFT ATRIUM             Index        RIGHT ATRIUM           Index LA diam:        3.20 cm 1.76 cm/m   RA Area:     15.20 cm LA Vol (A2C):   47.0 ml 25.89 ml/m  RA Volume:   38.50 ml  21.21 ml/m LA Vol (A4C):   42.0 ml 23.14 ml/m LA Biplane Vol: 45.2 ml 24.90 ml/m  AORTIC VALVE LVOT Vmax:   120.00 cm/s LVOT Vmean:  87.200 cm/s LVOT VTI:    0.269 m  AORTA Ao Root diam: 2.20 cm Ao Asc diam:  3.20 cm MITRAL VALVE MV Area (PHT):              SHUNTS MV Decel Time:              Systemic VTI:  0.27 m MV E velocity: 83.30 cm/s   Systemic Diam: 1.90 cm MV A velocity: 145.00 cm/s MV E/A ratio:   0.57 Oneil Parchment MD Electronically signed  by Oneil Parchment MD Signature Date/Time: 08/05/2023/11:51:06 AM    Final    CT CHEST ABDOMEN PELVIS W CONTRAST Result Date: 07/22/2023 CLINICAL DATA:  Small-cell lung cancer staging, possible liver metastases, chemotherapy and immunotherapy complete, ongoing maintenance chemotherapy * Tracking Code: BO * EXAM: CT CHEST, ABDOMEN, AND PELVIS WITH CONTRAST TECHNIQUE: Multidetector CT imaging of the chest, abdomen and pelvis was performed following the standard protocol during bolus administration of intravenous contrast. RADIATION DOSE REDUCTION: This exam was performed according to the departmental dose-optimization program which includes automated exposure control, adjustment of the mA and/or kV according to patient size and/or use of iterative reconstruction technique. CONTRAST:  OMNIPAQUE  IOHEXOL  300 MG/ML  SOLN COMPARISON:  04/21/2023 FINDINGS: CT CHEST FINDINGS Cardiovascular: Aortic atherosclerosis. Normal heart size. Three-vessel coronary artery calcifications. No pericardial effusion. Mediastinum/Nodes: Unchanged prominent mediastinal lymph node measuring up to 1.6 x 0.8 cm (series 2, image 20). Thyroid  gland, trachea, and esophagus demonstrate no significant findings. Lungs/Pleura: Severe emphysema. Moderate underlying pulmonary fibrosis in a pattern with apical to basal gradient, featuring irregular peripheral interstitial opacity, septal thickening, subpleural bronchiolectasis and areas of honeycombing at the lung bases. Unchanged subpleural nodule of the lingula measuring 0.6 cm (series 4, image 128). No pleural effusion or pneumothorax. Musculoskeletal: No chest wall abnormality. No acute osseous findings. CT ABDOMEN PELVIS FINDINGS Hepatobiliary: No solid liver abnormality is seen. No gallstones, gallbladder wall thickening, or biliary dilatation. Pancreas: Unremarkable. No pancreatic ductal dilatation or surrounding inflammatory changes. Spleen: Normal in  size without significant abnormality. Adrenals/Urinary Tract: Adrenal glands are unremarkable. Nonobstructive calculus of the inferior pole of the right kidney. No left-sided calculi, ureteral calculi, or hydronephrosis. Bladder is unremarkable. Stomach/Bowel: Stomach is within normal limits. Appendix appears normal. No evidence of bowel wall thickening, distention, or inflammatory changes. 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 Interval enlargement of a gastrohepatic ligament lymph node measuring 1.5 x 1.1 cm, previously 1.0 x 0.8 cm (series 2, image 59). Reproductive: Status post prostatectomy. Other: No abdominal wall hernia or abnormality. No ascites. Musculoskeletal: No acute osseous findings. IMPRESSION: 1. Interval enlargement of a gastrohepatic ligament lymph node measuring 1.5 x 1.1 cm, previously 1.0 x 0.8 cm, concerning for nodal metastatic disease. 2. Unchanged prominent pretracheal lymph node without other evidence of mediastinal lymphadenopathy 3. No other evidence of lymphadenopathy or metastatic disease in the chest, abdomen, or pelvis. 4. Unchanged subpleural nodule of the lingula measuring 0.6 cm, nonspecific. Continued attention on follow-up. 5. Severe emphysema and moderate underlying UIP pattern pulmonary fibrosis. 6. Status post prostatectomy. 7. Nonobstructive right nephrolithiasis. 8. 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 9. Coronary artery disease. Aortic Atherosclerosis (ICD10-I70.0) and Emphysema (ICD10-J43.9). Electronically Signed   By: Marolyn JONETTA Jaksch M.D.   On: 07/22/2023 17:07    ASSESSMENT AND PLAN: This is a very pleasant 82 years old white male with Extensive stage  (T3, N2, M1a) small cell lung cancer presented with obstructive left upper lobe lung mass in addition to mediastinal lymphadenopathy and malignant left pleural effusion as well as pleural-based metastasis and suspicious liver metastasis diagnosed in June 2023. The patient is currently undergoing systemic chemotherapy with carboplatin  for AUC of 4 on day 1, etoposide  80 Mg/M2 on days 1, 2 and 3 with Imfinzi  240  Mg/M2 on the days before chemotherapy and Imfinzi  1500 Mg IV on day 1 every 3 weeks.  Status post 21 cycles.  Starting from cycle #5 he will be on maintenance treatment with immunotherapy with Imfinzi  1500 Mg IV every 4 weeks.  He has been tolerating this treatment fairly well. He is also undergoing palliative radiotherapy to enlarging gastrohepatic ligament lymph node under the care of Dr. Shannon.  He is expected to complete this palliative radiation on August 26, 2023.    Extensive Stage Small Cell Lung Cancer Diagnosed June 2023. Completed four cycles of carboplatin , etoposide , and Imfinzi , followed by 22 cycles of maintenance Imfinzi . Recent scan showed an enlarged lymph node in the gastrohepatic ligament, currently receiving SBRT. Completed two of five planned treatments. Experiencing fatigue likely due to radiation therapy. No new issues with immune therapy, but has a rash on the neck and right frontal area of the face. Protein levels improved to 6.7, within normal range. - Continue maintenance Imfinzi  - Complete remaining three SBRT treatments - Apply hydrocortisone  cream 3-4 times daily for rash - Monitor and manage fatigue symptoms  Cough with Sputum Production Reports coughing with yellow to whitish sputum, no fever. Likely related to cold weather irritation. - Monitor symptoms - Advise on supportive care for cough  General Health Maintenance Labs within normal range, including protein levels improved to 6.7. - Continue monitoring lab results  Follow-up - Follow-up appointment  next year.  January of 2025  The patient was advised to call immediately if he has any concerning symptoms in the interval. The patient voices understanding of current disease status and treatment options and is in agreement with the current care plan.  All questions were answered. The patient knows to call the clinic with any problems, questions or concerns. We can certainly see the patient much sooner if necessary.  The total time spent in the appointment was 20 minutes.  Disclaimer: This note was dictated with voice recognition software. Similar sounding words can inadvertently be transcribed and may not be corrected upon review.

## 2023-08-20 ENCOUNTER — Other Ambulatory Visit: Payer: Self-pay

## 2023-08-20 ENCOUNTER — Ambulatory Visit: Payer: Medicare Other

## 2023-08-20 ENCOUNTER — Ambulatory Visit
Admission: RE | Admit: 2023-08-20 | Discharge: 2023-08-20 | Disposition: A | Discharge status: Home / Self Care | Payer: Medicare Other | Source: Ambulatory Visit | Attending: Radiation Oncology | Admitting: Radiation Oncology

## 2023-08-20 DIAGNOSIS — C3482 Malignant neoplasm of overlapping sites of left bronchus and lung: Secondary | ICD-10-CM

## 2023-08-20 LAB — RAD ONC ARIA SESSION SUMMARY
Course Elapsed Days: 6
Plan Fractions Treated to Date: 3
Plan Prescribed Dose Per Fraction: 8 Gy
Plan Total Fractions Prescribed: 5
Plan Total Prescribed Dose: 40 Gy
Reference Point Dosage Given to Date: 24 Gy
Reference Point Session Dosage Given: 8 Gy
Session Number: 3

## 2023-08-21 ENCOUNTER — Ambulatory Visit: Payer: Medicare Other

## 2023-08-21 ENCOUNTER — Ambulatory Visit: Payer: Medicare Other | Admitting: Radiation Oncology

## 2023-08-24 ENCOUNTER — Other Ambulatory Visit: Payer: Self-pay

## 2023-08-24 ENCOUNTER — Ambulatory Visit
Admission: RE | Admit: 2023-08-24 | Discharge: 2023-08-24 | Disposition: A | Discharge status: Home / Self Care | Payer: Medicare Other | Source: Ambulatory Visit | Attending: Radiation Oncology | Admitting: Radiation Oncology

## 2023-08-24 DIAGNOSIS — C3482 Malignant neoplasm of overlapping sites of left bronchus and lung: Secondary | ICD-10-CM

## 2023-08-24 LAB — RAD ONC ARIA SESSION SUMMARY
Course Elapsed Days: 10
Plan Fractions Treated to Date: 4
Plan Prescribed Dose Per Fraction: 8 Gy
Plan Total Fractions Prescribed: 5
Plan Total Prescribed Dose: 40 Gy
Reference Point Dosage Given to Date: 32 Gy
Reference Point Session Dosage Given: 8 Gy
Session Number: 4

## 2023-08-26 ENCOUNTER — Ambulatory Visit
Admission: RE | Admit: 2023-08-26 | Discharge: 2023-08-26 | Disposition: A | Discharge status: Home / Self Care | Payer: Medicare Other | Source: Ambulatory Visit | Attending: Radiation Oncology | Admitting: Radiation Oncology

## 2023-08-26 ENCOUNTER — Ambulatory Visit: Payer: Medicare Other

## 2023-08-26 ENCOUNTER — Other Ambulatory Visit: Payer: Self-pay

## 2023-08-26 DIAGNOSIS — C772 Secondary and unspecified malignant neoplasm of intra-abdominal lymph nodes: Secondary | ICD-10-CM | POA: Diagnosis not present

## 2023-08-26 DIAGNOSIS — Z87891 Personal history of nicotine dependence: Secondary | ICD-10-CM | POA: Diagnosis not present

## 2023-08-26 DIAGNOSIS — C3482 Malignant neoplasm of overlapping sites of left bronchus and lung: Secondary | ICD-10-CM

## 2023-08-26 LAB — RAD ONC ARIA SESSION SUMMARY
Course Elapsed Days: 12
Plan Fractions Treated to Date: 5
Plan Prescribed Dose Per Fraction: 8 Gy
Plan Total Fractions Prescribed: 5
Plan Total Prescribed Dose: 40 Gy
Reference Point Dosage Given to Date: 40 Gy
Reference Point Session Dosage Given: 8 Gy
Session Number: 5

## 2023-08-27 NOTE — Radiation Completion Notes (Addendum)
  Radiation Oncology         [phone]) [phone] ________________________________  Name: Pedro Pope. MRN: [id number]  Date of Service: 08/26/2023  DOB: 1940-12-10  End of Treatment Note    Diagnosis: Extensive stage small cell lung cancer of the LUL with gastrohepatic adenopathy  Intent: Curative     ==========DELIVERED PLANS==========  First Treatment Date: 2023-08-14 Last Treatment Date: 2023-08-26   Plan Name: Abd_SBRT Site: Abdomen Technique: SBRT/SRT-IMRT Mode: Photon Dose Per Fraction: 8 Gy Prescribed Dose (Delivered / Prescribed): 40 Gy / 40 Gy Prescribed Fxs (Delivered / Prescribed): 5 / 5     ==========ON TREATMENT VISIT DATES========== 2023-08-14, 2023-08-17, 2023-08-20, 2023-08-24, 2023-08-26, 2023-08-26   See weekly On Treatment Notes in Epic for details in the Media tab (listed as Progress notes on the On Treatment Visit Dates listed above).  The patient tolerated radiation.    The patient will follow up in one month from the radiation oncology department. He will continue follow up with Dr. Sherrod as well.      Donald KYM Husband, PAC

## 2023-08-29 ENCOUNTER — Other Ambulatory Visit: Payer: Self-pay

## 2023-09-07 NOTE — Progress Notes (Addendum)
OV 07/17/2022 -transfer of care to the ILD center wit Dr. Marchelle Gearing.  Previous patient of Dr. Judeth Horn  Subjective:  Patient ID: Pedro Simmonds., male , DOB: [birth year] , age 83 y.o. , MRN: [id number] , ADDRESS: [address] [place] [address] PCP [name], MD Patient Care Team: [name], MD as PCP - General (Internal Medicine) Runell Gess, MD as PCP - Cardiology (Cardiology) Hunsucker, Lesia Sago, MD as Consulting Physician (Pulmonary Disease)  This Provider for this visit: Treatment Team:  Attending Provider: Kalman Shan, MD    07/17/2022 -   Chief Complaint  Patient presents with   Follow-up    Pt states he has been doing okay since last visit.     HPI Pedro Pope. 83 y.o. -presents with his wife and son Pedro Pope.  He is originally from [place].  He is still in [employer] but also [employer] he is retired he lives in [place] to be near his son.  History is gained from review of the chart but also talking to him and his wife.  He is a former smoker.  He has a previous history of prostate cancer.  In the summer of 2023 had malignant pleural effusion on the left side from lung cancer and was status post Pleurx which was removed in the fall 2023.  He has been undergoing immunotherapy with Dr. Arbutus Ped.  He is tolerating it well.  The cancer is improved.  He does have some skin lesions on his right forearm.  He wants this gone.  We have advised him to talk to Dr. Arbutus Ped about it.  During the initial diagnose of cancer in the workup he was on 4 L of oxygen but currently he does not feel he needs it and does not using it.  His wife and son feel that physician needs to ascertain if he needs it.  He had failure to thrive and lost a lot of weight from baseline 145 pounds 210 pounds.  He is currently regained much of it and is at 135 pounds approximately.  Overall he feels good he is doing his activities of daily living.  He is able to do  stuff around the house but when he goes to [employer] with the son he gets quite short of breath.  Current walking desaturation test is normal without significant desaturation although he did drop 3 points   Review of his CT from 2 and half years ago CT abdomen lung image when he had hematuria showed some ILD in the lung bases.  There is no clear-cut honeycombing in my opinion but was subpleural bilateral bibasal with reticulation.  Current CT scan of the chest shows classic UIP features in my view.  There is also associated emphysema.  ILD risk factors/IPF risk factors -Former smoker -Age greater than 5 and Caucasian ethnicity -Progression with classic UIP -He gives a history of Raynaud's but no autoimmune disease -No serology available -Ongoing immunotherapy for lung cancer -Associated emphysema    CT Chest data - HRCT 06/19/22    IMPRESSION: 1. Diminished left pleural effusion, with a trace residual and some associated pleural thickening, consistent with treated pleural metastatic disease. 2. Hypodense lesion of the posterior liver dome, hepatic segment VII is diminished in size, measuring no greater than 0.4 cm, consistent with treatment response of a small hepatic metastasis. 3. Status post prostatectomy. Small focus of contrast enhancement in the prostatectomy bed, unchanged compared to prior examination although of uncertain  significance. Correlate with PSA. 4. Unchanged moderate UIP pattern pulmonary fibrosis. 5. Emphysema. 6. Coronary artery disease. 7. Cholelithiasis. 8. Nonobstructive right nephrolithiasis.   Aortic Atherosclerosis (ICD10-I70.0) and Emphysema (ICD10-J43.9).     Electronically Signed   By: Jearld Lesch M.D.   On: 06/22/2022 15:06    No results found.    PFT      No data to display         ONCOLYG HX Oct 2023 - Dr Harvie Bridge  DIAGNOSIS: Extensive stage (T3, N2, M1b) small cell lung cancer presented with obstructive left upper lobe lung mass  in addition to mediastinal lymphadenopathy and malignant left pleural effusion as well as pleural-based metastasis as well as suspicious liver metastasis diagnosed in June 2023.   PRIOR THERAPY: None   CURRENT THERAPY: Palliative systemic chemotherapy with carboplatin initiated for AUC of 4 on day 1 and etoposide 80 Mg/M2 on days 1, 2 and 3 with Cosela before the chemotherapy and Imfinzi 1500 Mg IV on day 1 every 3 weeks.  Status post 5 cycles.  Starting from cycle #5 the patient will be on maintenance treatment with Imfinzi 1500 Mg IV every 4 weeks. will see him back for follow-up visit in 4 weeks for evaluation with repeat CT scan of the chest, abdomen and pelvis for restaging of his disease.      09/30/2022 Follow up : ILD , Extensive stage Small cell lung cancer    83 year old male seen for evaluation of  Interstitial lung disease July 17, 2022 with Dr. Marchelle Gearing  Diagnosed with extensive stage small cell lung cancer diagnosed in June 2023 with obstructive left upper lobe lung mass in addition to mediastinal lymphadenopathy and malignant left pleural effusion as well as pleural-based metastasis and suspected liver metastasis. S/p Pleurx catheter (removed Fall 2023) followed by oncology on systemic chemo with carboplatin and immunotherapy with Imfinzi.    TEST/EVENTS :  ILD risk factors/IPF risk factors -Former smoker -Age greater than 83 and Caucasian ethnicity -Progression with classic UIP -He gives a history of Raynaud's but no autoimmune disease -No serology available -Ongoing immunotherapy for lung cancer -Associated emphysema  CT chest June 19, 2022 showed a moderate pulmonary fibrosis with apical to basal gradient, irregular peripheral interstitial opacity and traction bronchiectasis along with honeycombing at the bases.  Moderate emphysema.  Decreased left pleural effusion with associated pleural thickening  CT chest August 25, 2022 showed interval development of abnormal  lymph node in the gastrohepatic ligament.  Previous liver lesion not well-seen.  No new liver lesions.  Stable pulmonary fibrosis and emphysematous chang Patient returns for a 83-month follow-up.  Patient was seen last visit for an evaluation of interstitial lung disease.  He is undergoing treatment for extensive stage small cell lung cancer.  A CT chest has shown pulmonary fibrosis.  Felt to have IPF, CT chest shows a UIP pattern.  As above he has several risk factors for IPF.  Patient was recommended to begin Esbriet.  He is currently awaiting insurance approval.  Patient was also tried on Spiriva and Incruse in the past but says it really did not make much difference in his breathing and they are not very affordable.  Patient is on oxygen 2 L with activity and at bedtime.  Patient says he is not been using his oxygen during the daytime feels that he really does not need it at this time.  Walk test in the office showed no significant desaturations on room air.  He had an overnight  oximetry test results are pending.. Patient says overall he is doing okay.  He gets winded with heavy activities.  Denies any hemoptysis, chest pain, orthopnea or increased edema. Patient is undergoing some treatment for Port-A-Cath infection.  He is being followed by oncology and  treated with antibiotics.    TEST/EVENTS :  ILD risk factors/IPF risk factors -Former smoker -Age greater than 61 and Caucasian ethnicity -Progression with classic UIP -He gives a history of Raynaud's but no autoimmune disease -No serology available -Ongoing immunotherapy for lung cancer -Associated emphysema  CT chest June 19, 2022 showed a moderate pulmonary fibrosis with apical to basal gradient, irregular peripheral interstitial opacity and traction bronchiectasis along with honeycombing at the bases.  Moderate emphysema.  Decreased left pleural effusion with associated pleural thickening  CT chest August 25, 2022 showed interval  development of abnormal lymph node in the gastrohepatic ligament.  Previous liver lesion not well-seen.  No new liver lesions.  Stable pulmonary fibrosis and emphysematous chang  OV 11/04/2022  Subjective:  Patient ID: Pedro Simmonds., male , DOB: 24-May-1941 , age 62 y.o. , MRN: [id number] , ADDRESS: [address] [place] [address] PCP [name], MD Patient Care Team: [name], MD as PCP - General (Internal Medicine) Runell Gess, MD as PCP - Cardiology (Cardiology) Hunsucker, Lesia Sago, MD as Consulting Physician (Pulmonary Disease)  This Provider for this visit: Treatment Team:  Attending Provider: Kalman Shan, MD    11/04/2022 -   Chief Complaint  Patient presents with   Follow-up    F/u no concerns     HPI Pedro Simmonds. 83 y.o. -returns for follow-up.  He continues to be stable from a respiratory standpoint.  He came driving today.  He has got good functional status ECOG is 0.  He had an infected Port-A-Cath and this has been removed.  For his emphysema he is on Incruse.  He has seen nurse practitioner Tammy.  He was started on nighttime oxygen after which his shortness of breath is better according to his history.  He does not feel any worse there is no pulmonary function test today.  I did notice that he is yet to start his pirfenidone.  I spoke to him about this and he says it is too expensive and not affordable.  I did discuss about the inflation reduction act and the upfront expectation and patient payment of up to 3000- $3500.  But he says it is too expensive.  He is asking for charity programs.  I did indicate to him under the new inflation reduction and charity programs can be hard to come by but I did indicate to him that I would reach out to our pharmacy team.     OV 09/08/2023  Subjective:  Patient ID: Pedro Simmonds., male , DOB: [birth year] , age 11 y.o. , MRN: [id number] , ADDRESS: [address] [place] [address] PCP [name], MD Patient Care Team: [name], MD as PCP - General (Internal Medicine) Runell Gess, MD as PCP - Cardiology (Cardiology) Hunsucker, Lesia Sago, MD as Consulting Physician (Pulmonary Disease)  This Provider for this visit: Treatment Team:  Attending Provider: Kalman Shan, MD   #IPF #Combined emphysema #Extensive stage T3, N2, M1 B small cell lung cancer with obstructive left upper lobe lung mass and mediastinal adenopathy and malignant left pleural effusion and pleural mets and suspicious liver mets diagnosed in June 2023.  -On Imfinzi every 4 weeks by Dr. Arbutus Ped  -  Completed 22 cycles as of 08/18/2023.  09/08/2023 -   Chief Complaint  Patient presents with   Follow-up     HPI Pedro Simmonds. 83 y.o. -I personally not seen Mr. Munir Victorian for almost a year [10 months].  Review of his medical record indicates on 08/18/2023 he followed up with Dr. Si Gaul oncologist.  He has spread of his malignancy.  He has received 2 sessions of SBRT for enlarged lymph node in the gastrohepatic ligament on the recent CT chest..  This was as of 08/18/2023.  It appears he has had more SBRT as of 08/26/2023.  He remains on immunotherapy.  He says he is overall doing well.  From a pulmonary standpoint he says he is actually feeling better but I question him he admitted to shortness of breath on stairs and wrote a symptom question as below.  But he says it is stable as of 1 year ago but he does admit his cough is worse.  When we exercised him he did desaturate.  He was not aware of the desaturations.  He uses nocturnal oxygen which in the past he said had helped him but today he says it is helping him but is causing crustiness in the right nostril and therefore he stopped using it 2 days ago.  He is willing to use it if it is not hindrance to his right nostril.  I did indicate to him that he qualifies for portable oxygen but is not ready for this.  I also expressed to him that he not  only has emphysema but also with pulmonary fibrosis.  He could not remember the diagnose of pulmonary fibrosis.  I also reminded him that we talked antifibrotic's in the past but he could not recall this.  However he agreed to have a spirometry and DLCO in 8 weeks and reassess his exercise hypoxemia test in 8 weeks and response to Spiriva.  If there is progression of ILD D based on PFTs or if there is persistent exercise hypoxemia then we will have to entertain antifibrotic's and also portable oxygen.  This can be done at follow-up.  SYMPTOM SCALE - ILD 09/08/2023  Current weight   O2 use ra  Shortness of Breath 0 -> 5 scale with 5 being worst (score 6 If unable to do)  At rest 1  Simple tasks - showers, clothes change, eating, shaving 1  Household (dishes, doing bed, laundry) 2  Shopping 3  Walking level at own pace 2  Walking up Stairs 4  Total (30-36) Dyspnea Score 13  How bad is your cough? 2  How bad is your fatigue 2  How bad is nausea 0  How bad is vomiting?  0  How bad is diarrhea? 0  How bad is anxiety? 1  How bad is depression 1  Any chronic pain - if so where and how bad x           Simple office walk 185 feet x  3 laps goal with forehead probe 07/17/2022  09/08/2023   O2 used ra ra  Number laps completed 3 Sit and stand x 15  Comments about pace avg   Resting Pulse Ox/HR 100% and 59/min 94% and HR 70  Final Pulse Ox/HR 97% and 88/min 84% and HR 76  Desaturated </= 88% no   Desaturated <= 3% points yes   Got Tachycardic >/= 90/min no   Symptoms at end of test Mild dyspnea    Miscellaneous comments  x    CT Chest data from date: 07/20/23  - personally visualized and independently interpreted : yes - my findings are: as below  IMPRESSION: 1. Interval enlargement of a gastrohepatic ligament lymph node measuring 1.5 x 1.1 cm, previously 1.0 x 0.8 cm, concerning for nodal metastatic disease. 2. Unchanged prominent pretracheal lymph node without other  evidence of mediastinal lymphadenopathy 3. No other evidence of lymphadenopathy or metastatic disease in the chest, abdomen, or pelvis. 4. Unchanged subpleural nodule of the lingula measuring 0.6 cm, nonspecific. Continued attention on follow-up. 5. Severe emphysema and moderate underlying UIP pattern pulmonary fibrosis. 6. Status post prostatectomy. 7. Nonobstructive right nephrolithiasis. 8. 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 9. Coronary artery disease.   Aortic Atherosclerosis (ICD10-I70.0) and Emphysema (ICD10-J43.9).     Electronically Signed   By: Jearld Lesch M.D.   On: 07/22/2023 17:07  PFT    Latest Ref Rng & Units 02/04/2023    1:40 PM 09/30/2022    5:04 PM  PFT Results  FVC-Pre L 3.41    FVC-Predicted Pre % 90  103   FVC-Post L  3.76   FVC-Predicted Post %  102   Pre FEV1/FVC % % 62  74   Post FEV1/FCV % %  72   FEV1-Pre L 2.13  2.79   FEV1-Predicted Pre % 80  108   FEV1-Post L  2.72   DLCO uncorrected ml/min/mmHg 9.84  9.13   DLCO UNC% % 42  40   DLCO corrected ml/min/mmHg 10.41  9.73   DLCO COR %Predicted % 44  42   DLVA Predicted % 58  49   TLC L  6.45   TLC % Predicted %  96   RV % Predicted %  107       LAB RESULTS last 96 hours No results found.  LAB RESULTS last 90 days Recent Results (from the past 2160 hours)  CMP (Cancer Center only)     Status: Abnormal   Collection Time: 06/22/23 11:40 AM  Result Value Ref Range   Sodium 140 135 - 145 mmol/L   Potassium 4.8 3.5 - 5.1 mmol/L   Chloride 107 98 - 111 mmol/L   CO2 30 22 - 32 mmol/L   Glucose, Bld 81 70 - 99 mg/dL    Comment: Glucose reference range applies only to samples taken after fasting for at least 8 hours.   BUN 21 8 - 23 mg/dL   Creatinine 9.14 7.82 - 1.24 mg/dL   Calcium 9.2 8.9 - 95.6 mg/dL   Total Protein 6.2 (L) 6.5 - 8.1 g/dL    Albumin 3.8 3.5 - 5.0 g/dL   AST 16 15 - 41 U/L   ALT 7 0 - 44 U/L   Alkaline Phosphatase 68 38 - 126 U/L   Total Bilirubin 0.4 <1.2 mg/dL   GFR, Estimated >21 >30 mL/min    Comment: (NOTE) Calculated using the CKD-EPI Creatinine Equation (2021)    Anion gap 3 (L) 5 - 15    Comment: Performed at [facility], [address], [place], [place] [address]  CBC with Differential (Cancer Center Only)     Status: None   Collection Time: 06/22/23 11:40 AM  Result Value Ref Range   WBC Count 8.6 4.0 - 10.5 K/uL   RBC 4.27 4.22 - 5.81 MIL/uL   Hemoglobin 13.1 13.0 -  17.0 g/dL   HCT 16.1 09.6 - 04.5 %   MCV 92.0 80.0 - 100.0 fL   MCH 30.7 26.0 - 34.0 pg   MCHC 33.3 30.0 - 36.0 g/dL   RDW 40.9 81.1 - 91.4 %   Platelet Count 174 150 - 400 K/uL   nRBC 0.0 0.0 - 0.2 %   Neutrophils Relative % 63 %   Neutro Abs 5.5 1.7 - 7.7 K/uL   Lymphocytes Relative 22 %   Lymphs Abs 1.9 0.7 - 4.0 K/uL   Monocytes Relative 10 %   Monocytes Absolute 0.9 0.1 - 1.0 K/uL   Eosinophils Relative 3 %   Eosinophils Absolute 0.3 0.0 - 0.5 K/uL   Basophils Relative 1 %   Basophils Absolute 0.0 0.0 - 0.1 K/uL   Immature Granulocytes 1 %   Abs Immature Granulocytes 0.04 0.00 - 0.07 K/uL    Comment: Performed at [facility], [address] [place], [place] [address]  CMP (Cancer Center only)     Status: Abnormal   Collection Time: 07/23/23  9:31 AM  Result Value Ref Range   Sodium 144 135 - 145 mmol/L   Potassium 4.2 3.5 - 5.1 mmol/L   Chloride 109 98 - 111 mmol/L   CO2 31 22 - 32 mmol/L   Glucose, Bld 80 70 - 99 mg/dL    Comment: Glucose reference range applies only to samples taken after fasting for at least 8 hours.   BUN 28 (H) 8 - 23 mg/dL   Creatinine 6.21 3.08 - 1.24 mg/dL   Calcium 9.2 8.9 - 65.7 mg/dL   Total Protein 6.3 (L) 6.5 - 8.1 g/dL   Albumin 3.9 3.5 - 5.0 g/dL   AST 18 15 - 41 U/L   ALT 9 0 - 44 U/L   Alkaline Phosphatase 63 38 - 126 U/L    Total Bilirubin 0.4 <1.2 mg/dL   GFR, Estimated >84 >69 mL/min    Comment: (NOTE) Calculated using the CKD-EPI Creatinine Equation (2021)    Anion gap 4 (L) 5 - 15    Comment: Performed at [facility], [address], [place], [place] [address]  CBC with Differential (Cancer Center Only)     Status: None   Collection Time: 07/23/23  9:31 AM  Result Value Ref Range   WBC Count 8.0 4.0 - 10.5 K/uL   RBC 4.31 4.22 - 5.81 MIL/uL   Hemoglobin 13.0 13.0 - 17.0 g/dL   HCT 84.1 32.4 - 40.1 %   MCV 93.3 80.0 - 100.0 fL   MCH 30.2 26.0 - 34.0 pg   MCHC 32.3 30.0 - 36.0 g/dL   RDW 02.7 25.3 - 66.4 %   Platelet Count 190 150 - 400 K/uL   nRBC 0.0 0.0 - 0.2 %   Neutrophils Relative % 63 %   Neutro Abs 5.0 1.7 - 7.7 K/uL   Lymphocytes Relative 20 %   Lymphs Abs 1.6 0.7 - 4.0 K/uL   Monocytes Relative 12 %   Monocytes Absolute 1.0 0.1 - 1.0 K/uL   Eosinophils Relative 4 %   Eosinophils Absolute 0.4 0.0 - 0.5 K/uL   Basophils Relative 1 %   Basophils Absolute 0.0 0.0 - 0.1 K/uL   Immature Granulocytes 0 %   Abs Immature Granulocytes 0.03 0.00 - 0.07 K/uL    Comment: Performed at [facility], [address], [place], [place] [address]  ECHOCARDIOGRAM COMPLETE  Status: None   Collection Time: 08/04/23  4:13 PM  Result Value Ref Range   Area-P 1/2 2.59 cm2   S' Lateral 3.10 cm   Est EF 45 - 50%   Rad Onc Aria Session Summary     Status: None   Collection Time: 08/14/23  1:42 PM  Result Value Ref Range   Course ID [email or web address]    Course Intent Palliative    Course Start Date 07/30/2023    Session Number 1    Course First Treatment Date 08/14/2023  1:37 PM    Course Last Treatment Date 08/14/2023  1:40 PM    Course Elapsed Days 0    Reference Point ID Abd DP    Reference Point Dosage Given to Date 8.00000001 Gy   Reference Point Session Dosage Given 8.00000001 Gy   Plan ID [email or web address]    Plan Name simsbrtabd    Plan Fractions Treated  to Date 1    Plan Total Fractions Prescribed 5    Plan Prescribed Dose Per Fraction 8 Gy   Plan Total Prescribed Dose 40.000000 Gy   Plan Primary Reference Point Abd DP   Rad Onc Aria Session Summary     Status: None   Collection Time: 08/17/23  1:09 PM  Result Value Ref Range   Course ID [email or web address]    Course Intent Palliative    Course Start Date 07/30/2023    Session Number 2    Course First Treatment Date 08/14/2023  1:37 PM    Course Last Treatment Date 08/17/2023  1:07 PM    Course Elapsed Days 3    Reference Point ID Abd DP    Reference Point Dosage Given to Date 16.00000002 Gy   Reference Point Session Dosage Given 8.00000001 Gy   Plan ID [email or web address]    Plan Name simsbrtabd    Plan Fractions Treated to Date 2    Plan Total Fractions Prescribed 5    Plan Prescribed Dose Per Fraction 8 Gy   Plan Total Prescribed Dose 40.000000 Gy   Plan Primary Reference Point Abd DP   CMP (Cancer Center only)     Status: Abnormal   Collection Time: 08/18/23  1:44 PM  Result Value Ref Range   Sodium 140 135 - 145 mmol/L   Potassium 4.9 3.5 - 5.1 mmol/L   Chloride 106 98 - 111 mmol/L   CO2 30 22 - 32 mmol/L   Glucose, Bld 82 70 - 99 mg/dL    Comment: Glucose reference range applies only to samples taken after fasting for at least 8 hours.   BUN 21 8 - 23 mg/dL   Creatinine 8.11 9.14 - 1.24 mg/dL   Calcium 9.3 8.9 - 78.2 mg/dL   Total Protein 6.7 6.5 - 8.1 g/dL   Albumin 3.9 3.5 - 5.0 g/dL   AST 17 15 - 41 U/L   ALT 7 0 - 44 U/L   Alkaline Phosphatase 69 38 - 126 U/L   Total Bilirubin 0.5 0.0 - 1.2 mg/dL   GFR, Estimated >95 >62 mL/min    Comment: (NOTE) Calculated using the CKD-EPI Creatinine Equation (2021)    Anion gap 4 (L) 5 - 15    Comment: Performed at [facility], [address], [place], [place] [address]  CBC with Differential (Cancer Center Only)     Status: Abnormal   Collection Time: 08/18/23  1:44 PM  Result Value Ref Range   WBC Count 8.4 4.0 -  10.5 K/uL  RBC 4.37 4.22 - 5.81 MIL/uL   Hemoglobin 13.1 13.0 - 17.0 g/dL   HCT 16.1 09.6 - 04.5 %   MCV 93.4 80.0 - 100.0 fL   MCH 30.0 26.0 - 34.0 pg   MCHC 32.1 30.0 - 36.0 g/dL   RDW 40.9 81.1 - 91.4 %   Platelet Count 153 150 - 400 K/uL   nRBC 0.0 0.0 - 0.2 %   Neutrophils Relative % 66 %   Neutro Abs 5.5 1.7 - 7.7 K/uL   Lymphocytes Relative 16 %   Lymphs Abs 1.4 0.7 - 4.0 K/uL   Monocytes Relative 13 %   Monocytes Absolute 1.1 (H) 0.1 - 1.0 K/uL   Eosinophils Relative 5 %   Eosinophils Absolute 0.4 0.0 - 0.5 K/uL   Basophils Relative 0 %   Basophils Absolute 0.0 0.0 - 0.1 K/uL   Immature Granulocytes 0 %   Abs Immature Granulocytes 0.03 0.00 - 0.07 K/uL    Comment: Performed at [facility], [address] [place], [place] [address]  Rad Jeralene Huff Session Summary     Status: None   Collection Time: 08/20/23  1:33 PM  Result Value Ref Range   Course ID [email or web address]    Course Intent Palliative    Course Start Date 07/30/2023    Session Number 3    Course First Treatment Date 08/14/2023  1:37 PM    Course Last Treatment Date 08/20/2023  1:31 PM    Course Elapsed Days 6    Reference Point ID Abd DP    Reference Point Dosage Given to Date 24.00000003 Gy   Reference Point Session Dosage Given 8.00000001 Gy   Plan ID [email or web address]    Plan Name simsbrtabd    Plan Fractions Treated to Date 3    Plan Total Fractions Prescribed 5    Plan Prescribed Dose Per Fraction 8 Gy   Plan Total Prescribed Dose 40.000000 Gy   Plan Primary Reference Point Abd DP   Rad Onc Aria Session Summary     Status: None   Collection Time: 08/24/23  1:53 PM  Result Value Ref Range   Course ID [email or web address]    Course Intent Palliative    Course Start Date 07/30/2023    Session Number 4    Course First Treatment Date 08/14/2023  1:37 PM    Course Last Treatment Date 08/24/2023  1:51 PM    Course Elapsed Days 10    Reference Point ID Abd DP    Reference Point Dosage Given to Date  32.00000004 Gy   Reference Point Session Dosage Given 8.00000001 Gy   Plan ID [email or web address]    Plan Name simsbrtabd    Plan Fractions Treated to Date 4    Plan Total Fractions Prescribed 5    Plan Prescribed Dose Per Fraction 8 Gy   Plan Total Prescribed Dose 40.000000 Gy   Plan Primary Reference Point Abd DP   Rad Onc Aria Session Summary     Status: None   Collection Time: 08/26/23  1:50 PM  Result Value Ref Range   Course ID [email or web address]    Course Intent Palliative    Course Start Date 07/30/2023    Session Number 5    Course First Treatment Date 08/14/2023  1:37 PM    Course Last Treatment Date 08/26/2023  1:48 PM    Course Elapsed Days 12    Reference Point ID Abd DP    Reference Point Dosage Given to Date  40.00000005 Gy   Reference Point Session Dosage Given 8.00000001 Gy   Plan ID [email or web address]    Plan Name simsbrtabd    Plan Fractions Treated to Date 5    Plan Total Fractions Prescribed 5    Plan Prescribed Dose Per Fraction 8 Gy   Plan Total Prescribed Dose 40.000000 Gy   Plan Primary Reference Point Abd DP          has a past medical history of Anosmia, CAD (coronary artery disease), native coronary artery, Chronic headaches, Dressler's syndrome (HCC), ED (erectile dysfunction), Elevated TSH, Fatty liver, Gout, Ischemic cardiomyopathy, PAF (paroxysmal atrial fibrillation) (HCC), Pre-diabetes, Prostate cancer (HCC), Rheumatic fever (08/18[birth year]1945), and Small cell lung cancer (HCC) (01/2022).   reports that he quit smoking about 31 years ago. His smoking use included cigarettes. He has never used smokeless tobacco.  Past Surgical History:  Procedure Laterality Date   CORONARY ANGIOPLASTY WITH STENT PLACEMENT  10/30/14   STEMI s/p DES to proximal LAD; residual 80 % proximal and 60% mid LCx diease   IR IMAGING GUIDED PORT INSERTION  03/12/2022   IR PATIENT EVAL TECH 0-60 MINS  10/20/2022   IR PATIENT EVAL TECH 0-60 MINS  10/27/2022   IR PATIENT EVAL TECH 0-60 MINS  10/30/2022   IR PATIENT  EVAL TECH 0-60 MINS  10/23/2022   IR PATIENT EVAL TECH 0-60 MINS  11/06/2022   IR PATIENT EVAL TECH 0-60 MINS  11/13/2022   IR PATIENT EVAL TECH 0-60 MINS  12/01/2022   IR RADIOLOGIST EVAL & MGMT  10/02/2022   IR RADIOLOGIST EVAL & MGMT  10/13/2022   IR RADIOLOGIST EVAL & MGMT  10/14/2022   IR RADIOLOGIST EVAL & MGMT  10/17/2022   IR RADIOLOGIST EVAL & MGMT  10/20/2022   IR REMOVAL TUN ACCESS W/ PORT W/O FL MOD SED  10/13/2022   LEFT HEART CATHETERIZATION WITH CORONARY ANGIOGRAM N/A 10/30/2014   Procedure: LEFT HEART CATHETERIZATION WITH CORONARY ANGIOGRAM;  Surgeon: Runell Gess, MD;  Location: [facility] CATH LAB;  Service: Cardiovascular;  Laterality: N/A;   PERCUTANEOUS CORONARY STENT INTERVENTION (PCI-S)  10/30/2014   Procedure: PERCUTANEOUS CORONARY STENT INTERVENTION (PCI-S);  Surgeon: Runell Gess, MD;  Location: [facility] CATH LAB;  Service: Cardiovascular;;   PROSTATECTOMY     THORACENTESIS Left 12/31/2021   Procedure: THORACENTESIS;  Surgeon: Lorin Glass, MD;  Location: [facility] ENDOSCOPY;  Service: Pulmonary;  Laterality: Left;   TONSILLECTOMY     VIDEO ASSISTED THORACOSCOPY (VATS)/DECORTICATION Left 01/21/2022   Procedure: VIDEO ASSISTED THORACOSCOPY (VATS)/DECORTICATION;  Surgeon: Corliss Skains, MD;  Location: [facility] OR;  Service: Thoracic;  Laterality: Left;    Allergies  Allergen Reactions   Penicillins Anaphylaxis   Clindamycin Other (See Comments)   Corn Syrup [Glucose] Other (See Comments)    High fructose corn syrup causes gout   Aspirin Adult Low [Aspirin] Itching and Rash    Dr. Timothy Lasso suspects that long term caused a rash, short term use appears to be ok    Immunization History  Administered Date(s) Administered   Fluad Quad(high Dose 65+) 05/26/2022   Fluad Trivalent(High Dose 65+) 05/25/2023   Influenza Split 05/20/2012, 09/01/2013   Influenza, High Dose Seasonal PF 04/23/2017   Influenza, Quadrivalent, Recombinant, Inj, Pf 04/08/2018, 07/18/2020   Influenza,inj,Quad PF,6+ Mos  11/01/2014   PFIZER Comirnaty(Gray Top)Covid-19 Tri-Sucrose Vaccine 12/16/2019, 05/18/2020   PFIZER(Purple Top)SARS-COV-2 Vaccination 10/14/2019, 11/08/2019   Pneumococcal Conjugate-13 09/01/2013, 10/01/2017   Pneumococcal Polysaccharide-23 11/01/2014   Tdap 08/18/2008   Zoster, Live  09/01/2013    Family History  Problem Relation Age of Onset   Cancer Mother    Colon cancer Father    Coronary artery disease Brother 7       s/p CABG   Cancer Other    Diabetes Other    Hyperlipidemia Other    Hypertension Other    Gout Other      Current Outpatient Medications:    acetaminophen (TYLENOL) 500 MG tablet, Take 500 mg by mouth every 6 (six) hours as needed for mild pain or moderate pain., Disp: , Rfl:    albuterol (VENTOLIN HFA) 108 (90 Base) MCG/ACT inhaler, Inhale 1-2 puffs into the lungs every 6 (six) hours as needed., Disp: 8 g, Rfl: 2   ascorbic acid (VITAMIN C) 500 MG tablet, Take 500 mg by mouth 2 (two) times daily., Disp: , Rfl:    Blood Pressure Monitor KIT, For daily blood pressure monitoring;  Arm Cuff, Disp: 1 each, Rfl: 0   carvedilol (COREG) 3.125 MG tablet, TAKE 1 TABLET(3.125 MG) BY MOUTH TWICE DAILY WITH A MEAL (Patient taking differently: once.), Disp: 180 tablet, Rfl: 3   escitalopram (LEXAPRO) 5 MG tablet, Take 5 mg by mouth daily., Disp: , Rfl:    hydrocortisone 1 % lotion, Apply 1 Application topically 2 (two) times daily., Disp: 118 mL, Rfl: 0   hydrOXYzine (ATARAX) 10 MG tablet, Take 1 tablet (10 mg total) by mouth 3 (three) times daily as needed., Disp: 30 tablet, Rfl: 0   PROCTO-MED HC 2.5 % rectal cream, Apply topically., Disp: , Rfl:    triamcinolone cream (KENALOG) 0.1 %, Apply 1 Application topically 2 (two) times daily., Disp: 80 g, Rfl: 0 No current facility-administered medications for this visit.  Facility-Administered Medications Ordered in Other Visits:    sodium chloride flush (NS) 0.9 % injection 10 mL, 10 mL, Intracatheter, Once, Si Gaul, MD      Objective:   Vitals:   09/08/23 1133  BP: (!) 115/52  Pulse: 67  SpO2: 93%  Weight: 148 lb 6.4 oz (67.3 kg)  Height: 5\' 9"  (1.753 m)    Estimated body mass index is 21.91 kg/m as calculated from the following:   Height as of this encounter: 5\' 9"  (1.753 m).   Weight as of this encounter: 148 lb 6.4 oz (67.3 kg).  @WEIGHTCHANGE @  [employer]   09/08/23 1133  Weight: 148 lb 6.4 oz (67.3 kg)     Physical Exam   General: No distress. Looks well O2 at rest: no Cane present: no Sitting in wheel chair: no Frail: no Obese: no Neuro: Alert and Oriented x 3. GCS 15. Speech normal Psych: Pleasant Resp:  Barrel Chest - no.  Wheeze - no, Crackles - yes, No overt respiratory distress CVS: Normal heart sounds. Murmurs - no Ext: Stigmata of Connective Tissue Disease - no SKIN: SMall rash on righ temple - DR Arbutus Ped awe HEENT: Normal upper airway. PEERL +. No post nasal drip        Assessment:       ICD-10-CM   1. IPF (idiopathic pulmonary fibrosis) (HCC)  Z61.096 Pulmonary function test    2. Pulmonary emphysema, unspecified emphysema type (HCC)  J43.9 Pulmonary function test    3. Combined pulmonary fibrosis and emphysema (CPFE) (HCC)  J43.9 Pulmonary function test   J84.10     4. Exercise hypoxemia  R09.02 Pulmonary function test    5. Nocturnal hypoxemia  G47.34 Pulmonary function test    6. COPD  with chronic bronchitis and emphysema (HCC)  J44.89    J43.9          Plan:     Patient Instructions     ICD-10-CM   1. IPF (idiopathic pulmonary fibrosis) (HCC)  J84.112     2. Pulmonary emphysema, unspecified emphysema type (HCC)  J43.9     3. Combined pulmonary fibrosis and emphysema (CPFE) (HCC)  J43.9    J84.10     4. Exercise hypoxemia  R09.02       Concerns of pulmonary fibrosis/emphysema might be slowly getting worse because you have new onset of exercise hypoxemia Understood that nasal oxygen is helping you but it is causing  crusting in the right nostril  Plan - Start Spiriva Respimat 2 puffs once daily [take 4-week samples] -Use saline nasal gel or spray for the nose on a daily basis and see if this will help you tolerate the night oxygen better -Do spirometry and DLCO in 8 weeks   #Followup  -  8 weeks with nurse practitioner but after spirometry and DLCO;  - if the fibrosis is getting worse then re-entertain conversation on antifibrotic's -If exercise hypoxemia test shows desaturation again entertain conversation on portable oxygen system   FOLLOWUP No follow-ups on file.    SIGNATURE    Dr. Kalman Shan, M.D., F.C.C.P,  Pulmonary and Critical Care Medicine Staff Physician, [facility] Director - Interstitial Lung Disease  Program  Pulmonary Fibrosis [facility] at [facility] [place], [place], [address]  Pager: [phone], If no answer or between  15:00h - 7:00h: call [phone] Telephone: [phone]  11:37 AM 09/08/2023

## 2023-09-07 NOTE — Patient Instructions (Signed)
ICD-10-CM   1. IPF (idiopathic pulmonary fibrosis) ([place])  J84.112     2. ILD (interstitial lung disease) ([place])  J84.9     3. Pulmonary emphysema, unspecified emphysema type ([place])  J43.9     4. Combined pulmonary fibrosis and emphysema (CPFE) (HCC)  J43.9    J84.10       # IPF  - clinically stable but too bad not on esbriet due to cost but seems o2 at night is helping you  Plan - wil reach out to pharmacy to see how we can make esbriet more affordable but you might not have a choice on this with CMS  #emphysema  PLan - continue incruse daily; currently stable    # Combined emphysema and pulmonary fibrosis  PLAN -  - Do full spirometry and dlco in 3 months - get RSV vaccine ASAP  - continue night o2  #Followup  - 12  weeks with Dr. Chase Caller 30-minute visit but after PFT  -symptoms score and walking desaturation test at follow-up

## 2023-09-08 ENCOUNTER — Ambulatory Visit (INDEPENDENT_AMBULATORY_CARE_PROVIDER_SITE_OTHER): Payer: Medicare Other | Admitting: Internal Medicine

## 2023-09-08 ENCOUNTER — Encounter: Payer: Self-pay | Admitting: Internal Medicine

## 2023-09-08 VITALS — BP 115/52 | HR 67 | Ht 69.0 in | Wt 148.4 lb

## 2023-09-08 DIAGNOSIS — G4734 Idiopathic sleep related nonobstructive alveolar hypoventilation: Secondary | ICD-10-CM

## 2023-09-08 DIAGNOSIS — R0902 Hypoxemia: Secondary | ICD-10-CM | POA: Diagnosis not present

## 2023-09-08 DIAGNOSIS — J439 Emphysema, unspecified: Secondary | ICD-10-CM

## 2023-09-08 DIAGNOSIS — J84112 Idiopathic pulmonary fibrosis: Secondary | ICD-10-CM | POA: Diagnosis not present

## 2023-09-08 DIAGNOSIS — J8410 Pulmonary fibrosis, unspecified: Secondary | ICD-10-CM

## 2023-09-08 DIAGNOSIS — J4489 Other specified chronic obstructive pulmonary disease: Secondary | ICD-10-CM

## 2023-09-08 MED ORDER — SPIRIVA RESPIMAT 1.25 MCG/ACT IN AERS: 2.0000 | INHALATION_SPRAY | Freq: Every day | RESPIRATORY_TRACT | Status: DC

## 2023-09-08 MED ORDER — SPIRIVA RESPIMAT 1.25 MCG/ACT IN AERS: 2.0000 | INHALATION_SPRAY | Freq: Every day | RESPIRATORY_TRACT | 3 refills | Status: DC

## 2023-09-08 NOTE — Addendum Note (Signed)
Addended by: Unk Pinto R on: 09/08/2023 04:51 PM   Modules accepted: Orders

## 2023-09-10 ENCOUNTER — Other Ambulatory Visit: Payer: Self-pay

## 2023-09-14 ENCOUNTER — Telehealth: Payer: Self-pay

## 2023-09-14 ENCOUNTER — Inpatient Hospital Stay: Payer: Medicare Other | Attending: Internal Medicine

## 2023-09-14 ENCOUNTER — Inpatient Hospital Stay: Payer: Medicare Other

## 2023-09-14 ENCOUNTER — Inpatient Hospital Stay (HOSPITAL_BASED_OUTPATIENT_CLINIC_OR_DEPARTMENT_OTHER): Payer: Medicare Other | Admitting: Internal Medicine

## 2023-09-14 VITALS — BP 108/54 | HR 63 | Resp 18

## 2023-09-14 VITALS — BP 104/81 | HR 60 | Temp 97.6°F | Resp 17 | Ht 69.0 in | Wt 146.0 lb

## 2023-09-14 DIAGNOSIS — D649 Anemia, unspecified: Secondary | ICD-10-CM | POA: Insufficient documentation

## 2023-09-14 DIAGNOSIS — C3482 Malignant neoplasm of overlapping sites of left bronchus and lung: Secondary | ICD-10-CM | POA: Diagnosis not present

## 2023-09-14 DIAGNOSIS — C3412 Malignant neoplasm of upper lobe, left bronchus or lung: Secondary | ICD-10-CM | POA: Diagnosis not present

## 2023-09-14 DIAGNOSIS — Z5112 Encounter for antineoplastic immunotherapy: Secondary | ICD-10-CM | POA: Diagnosis not present

## 2023-09-14 DIAGNOSIS — J910 Malignant pleural effusion: Secondary | ICD-10-CM | POA: Diagnosis not present

## 2023-09-14 DIAGNOSIS — J439 Emphysema, unspecified: Secondary | ICD-10-CM | POA: Insufficient documentation

## 2023-09-14 LAB — CMP (CANCER CENTER ONLY)
ALT: 14 U/L (ref 0–44)
AST: 21 U/L (ref 15–41)
Albumin: 3.9 g/dL (ref 3.5–5.0)
Alkaline Phosphatase: 65 U/L (ref 38–126)
Anion gap: 5 (ref 5–15)
BUN: 25 mg/dL — ABNORMAL HIGH (ref 8–23)
CO2: 27 mmol/L (ref 22–32)
Calcium: 9 mg/dL (ref 8.9–10.3)
Chloride: 107 mmol/L (ref 98–111)
Creatinine: 0.97 mg/dL (ref 0.61–1.24)
GFR, Estimated: >60 mL/min (ref >60)
Glucose, Bld: 102 mg/dL — ABNORMAL HIGH (ref 70–99)
Potassium: 4.2 mmol/L (ref 3.5–5.1)
Sodium: 139 mmol/L (ref 135–145)
Total Bilirubin: 0.3 mg/dL (ref 0.0–1.2)
Total Protein: 6.5 g/dL (ref 6.5–8.1)

## 2023-09-14 LAB — CBC WITH DIFFERENTIAL (CANCER CENTER ONLY)
Abs Immature Granulocytes: 0.03 10*3/uL (ref 0.00–0.07)
Basophils Absolute: 0 10*3/uL (ref 0.0–0.1)
Basophils Relative: 0 %
Eosinophils Absolute: 0.4 10*3/uL (ref 0.0–0.5)
Eosinophils Relative: 6 %
HCT: 39.3 % (ref 39.0–52.0)
Hemoglobin: 12.8 g/dL — ABNORMAL LOW (ref 13.0–17.0)
Immature Granulocytes: 0 %
Lymphocytes Relative: 16 %
Lymphs Abs: 1.3 10*3/uL (ref 0.7–4.0)
MCH: 30.3 pg (ref 26.0–34.0)
MCHC: 32.6 g/dL (ref 30.0–36.0)
MCV: 93.1 fL (ref 80.0–100.0)
Monocytes Absolute: 0.9 10*3/uL (ref 0.1–1.0)
Monocytes Relative: 12 %
Neutro Abs: 5.3 10*3/uL (ref 1.7–7.7)
Neutrophils Relative %: 66 %
Platelet Count: 165 10*3/uL (ref 150–400)
RBC: 4.22 MIL/uL (ref 4.22–5.81)
RDW: 13.6 % (ref 11.5–15.5)
WBC Count: 8 10*3/uL (ref 4.0–10.5)
nRBC: 0 % (ref 0.0–0.2)

## 2023-09-14 MED ORDER — SODIUM CHLORIDE 0.9 % IV SOLN: Freq: Once | INTRAVENOUS | Status: AC

## 2023-09-14 MED ORDER — SODIUM CHLORIDE 0.9 % IV SOLN
1500.0000 mg | Freq: Once | INTRAVENOUS | Status: AC
  Administered 2023-09-14: 1500 mg via INTRAVENOUS
  Filled 2023-09-14: qty 30

## 2023-09-14 NOTE — Telephone Encounter (Signed)
*  Pulm  Pharmacy Patient Advocate Encounter   Received notification from CoverMyMeds that prior authorization for Spiriva Respimat 1.25MCG/ACT aerosol  is required/requested.   Insurance verification completed.   The patient is insured through [facility] .   Per test claim: PA required; PA submitted to above mentioned insurance via CoverMyMeds Key/confirmation #/EOC [facility] Status is pending

## 2023-09-14 NOTE — Progress Notes (Signed)
[facility] [phone]   Fax:([phone]  OFFICE PROGRESS NOTE  [name], MD [address] [place] [address]  DIAGNOSIS: Extensive stage (T3, N2, M1b) small cell lung cancer presented with obstructive left upper lobe lung mass in addition to mediastinal lymphadenopathy and malignant left pleural effusion as well as pleural-based metastasis as well as suspicious liver metastasis diagnosed in June 2023.  PRIOR THERAPY: None  CURRENT THERAPY:  1) Palliative systemic chemotherapy with carboplatin initiated for AUC of 4 on day 1 and etoposide 80 Mg/M2 on days 1, 2 and 3 with Cosela before the chemotherapy and Imfinzi 1500 Mg IV on day 1 every 3 weeks.  Status post 21 cycles.  Starting from cycle #5 the patient will be on maintenance treatment with Imfinzi 1500 Mg IV every 4 weeks.  2) palliative radiotherapy to enlarging gastrohepatic ligament lymph node under the care of Dr. Roselind Messier expected to be completed on August 26, 2023.  INTERVAL HISTORY: Pedro Pope. 83 y.o. male returns to the clinic today for follow-up visit accompanied by his wife and son.Discussed the use of AI scribe software for clinical note transcription with the patient, who gave verbal consent to proceed.  History of Present Illness   Pedro Pope, an 83 year old patient with a history of extensive stage small cell lung cancer diagnosed in June 2023, presents for a follow-up visit. The patient has been on chemotherapy since diagnosis. He reports an appointment with his pulmonologist, Dr. Marchelle Gearing, who suggested a new, expensive medication for his COPD. The patient expresses concern about the cost of this medication and plans to discuss more affordable options with his pulmonologist.  The patient's breathing is not severely impaired, despite a diagnosis of severe emphysema. He uses supplemental oxygen at night, a regimen prescribed by his pulmonologist. A recent test showed a  drop in oxygen levels after physical exertion, but the patient's resting oxygen level remains at 100%.  The patient's blood work shows mild anemia with a hemoglobin level of 12.8. The patient is currently in a maintenance phase of his cancer treatment.        MEDICAL HISTORY: Past Medical History:  Diagnosis Date   Anosmia    CAD (coronary artery disease), native coronary artery    a. 10/29/2013 antlat STEMI s/p DES to pLAD; residual 80 % proximal and 60% mid LCx diease   Chronic headaches    Dressler's syndrome (HCC)    a. placed on colchicine    ED (erectile dysfunction)    Elevated TSH    Fatty liver    Gout    Ischemic cardiomyopathy    a. 2D ECHO 11/04/14 w/ EF 40-45% with LV WMA, G2DD, mild MR, mild LA dilation, mod TR.   PAF (paroxysmal atrial fibrillation) (HCC)    a. Dx on 11/03/14 admission. Not placed on longterm AC due to DAPT w/ receent DES. Will plan for outpt heart monitor    Pre-diabetes    a. HgA1c 6.1 in 10/2014   Prostate cancer [facility])    s/p prostatectomy and XRT   Rheumatic fever [birth year]18[birth year]1945   Small cell lung cancer (HCC) 01/2022    ALLERGIES:  is allergic to penicillins, clindamycin, corn syrup [glucose], and aspirin adult low [aspirin].  MEDICATIONS:  Current Outpatient Medications  Medication Sig Dispense Refill   acetaminophen (TYLENOL) 500 MG tablet Take 500 mg by mouth every 6 (six) hours as needed for mild pain or moderate pain.     albuterol (  VENTOLIN HFA) 108 (90 Base) MCG/ACT inhaler Inhale 1-2 puffs into the lungs every 6 (six) hours as needed. 8 g 2   ascorbic acid (VITAMIN C) 500 MG tablet Take 500 mg by mouth 2 (two) times daily.     Blood Pressure Monitor KIT For daily blood pressure monitoring;  Arm Cuff 1 each 0   carvedilol (COREG) 3.125 MG tablet TAKE 1 TABLET(3.125 MG) BY MOUTH TWICE DAILY WITH A MEAL (Patient taking differently: once.) 180 tablet 3   escitalopram (LEXAPRO) 5 MG tablet Take 5 mg by mouth daily.     hydrocortisone 1 %  lotion Apply 1 Application topically 2 (two) times daily. 118 mL 0   hydrOXYzine (ATARAX) 10 MG tablet Take 1 tablet (10 mg total) by mouth 3 (three) times daily as needed. 30 tablet 0   PROCTO-MED HC 2.5 % rectal cream Apply topically.     Tiotropium Bromide Monohydrate (SPIRIVA RESPIMAT) 1.25 MCG/ACT AERS Inhale 2 puffs into the lungs daily. 1 each 3   Tiotropium Bromide Monohydrate (SPIRIVA RESPIMAT) 1.25 MCG/ACT AERS Inhale 2 puffs into the lungs daily.     triamcinolone cream (KENALOG) 0.1 % Apply 1 Application topically 2 (two) times daily. 80 g 0   No current facility-administered medications for this visit.   Facility-Administered Medications Ordered in Other Visits  Medication Dose Route Frequency Provider Last Rate Last Admin   sodium chloride flush (NS) 0.9 % injection 10 mL  10 mL Intracatheter Once Si Gaul, MD        SURGICAL HISTORY:  Past Surgical History:  Procedure Laterality Date   CORONARY ANGIOPLASTY WITH STENT PLACEMENT  10/30/14   STEMI s/p DES to proximal LAD; residual 80 % proximal and 60% mid LCx diease   IR IMAGING GUIDED PORT INSERTION  03/12/2022   IR PATIENT EVAL TECH 0-60 MINS  10/20/2022   IR PATIENT EVAL TECH 0-60 MINS  10/27/2022   IR PATIENT EVAL TECH 0-60 MINS  10/30/2022   IR PATIENT EVAL TECH 0-60 MINS  10/23/2022   IR PATIENT EVAL TECH 0-60 MINS  11/06/2022   IR PATIENT EVAL TECH 0-60 MINS  11/13/2022   IR PATIENT EVAL TECH 0-60 MINS  12/01/2022   IR RADIOLOGIST EVAL & MGMT  10/02/2022   IR RADIOLOGIST EVAL & MGMT  10/13/2022   IR RADIOLOGIST EVAL & MGMT  10/14/2022   IR RADIOLOGIST EVAL & MGMT  10/17/2022   IR RADIOLOGIST EVAL & MGMT  10/20/2022   IR REMOVAL TUN ACCESS W/ PORT W/O FL MOD SED  10/13/2022   LEFT HEART CATHETERIZATION WITH CORONARY ANGIOGRAM N/A 10/30/2014   Procedure: LEFT HEART CATHETERIZATION WITH CORONARY ANGIOGRAM;  Surgeon: Runell Gess, MD;  Location: [facility] CATH LAB;  Service: Cardiovascular;  Laterality: N/A;   PERCUTANEOUS  CORONARY STENT INTERVENTION (PCI-S)  10/30/2014   Procedure: PERCUTANEOUS CORONARY STENT INTERVENTION (PCI-S);  Surgeon: Runell Gess, MD;  Location: [facility] CATH LAB;  Service: Cardiovascular;;   PROSTATECTOMY     THORACENTESIS Left 12/31/2021   Procedure: THORACENTESIS;  Surgeon: Lorin Glass, MD;  Location: [facility] ENDOSCOPY;  Service: Pulmonary;  Laterality: Left;   TONSILLECTOMY     VIDEO ASSISTED THORACOSCOPY (VATS)/DECORTICATION Left 01/21/2022   Procedure: VIDEO ASSISTED THORACOSCOPY (VATS)/DECORTICATION;  Surgeon: Corliss Skains, MD;  Location: [facility] OR;  Service: Thoracic;  Laterality: Left;    REVIEW OF SYSTEMS:  A comprehensive review of systems was negative except for: Constitutional: positive for fatigue Respiratory: positive for dyspnea on exertion   PHYSICAL  EXAMINATION: General appearance: alert, cooperative, and no distress Head: Normocephalic, without obvious abnormality, atraumatic Neck: no adenopathy, no JVD, supple, symmetrical, trachea midline, and thyroid not enlarged, symmetric, no tenderness/mass/nodules Lymph nodes: Cervical, supraclavicular, and axillary nodes normal. Resp: clear to auscultation bilaterally Back: symmetric, no curvature. ROM normal. No CVA tenderness. Cardio: regular rate and rhythm, S1, S2 normal, no murmur, click, rub or gallop GI: soft, non-tender; bowel sounds normal; no masses,  no organomegaly Extremities: extremities normal, atraumatic, no cyanosis or edema  ECOG PERFORMANCE STATUS: 1 - Symptomatic but completely ambulatory  Blood pressure 104/81, pulse 60, temperature 97.6 F (36.4 C), temperature source Temporal, resp. rate 17, height 5\' 9"  (1.753 m), weight 146 lb (66.2 kg), SpO2 100%.  LABORATORY DATA: Lab Results  Component Value Date   WBC 8.0 09/14/2023   HGB 12.8 (L) 09/14/2023   HCT 39.3 09/14/2023   MCV 93.1 09/14/2023   PLT 165 09/14/2023      Chemistry      Component Value Date/Time   NA 140 08/18/2023 1344   K 4.9  08/18/2023 1344   CL 106 08/18/2023 1344   CO2 30 08/18/2023 1344   BUN 21 08/18/2023 1344   CREATININE 0.94 08/18/2023 1344      Component Value Date/Time   CALCIUM 9.3 08/18/2023 1344   ALKPHOS 69 08/18/2023 1344   AST 17 08/18/2023 1344   ALT 7 08/18/2023 1344   BILITOT 0.5 08/18/2023 1344       RADIOGRAPHIC STUDIES: No results found.   ASSESSMENT AND PLAN: This is a very pleasant 83 years old white male with Extensive stage (T3, N2, M1a) small cell lung cancer presented with obstructive left upper lobe lung mass in addition to mediastinal lymphadenopathy and malignant left pleural effusion as well as pleural-based metastasis and suspicious liver metastasis diagnosed in June 2023. The patient is currently undergoing systemic chemotherapy with carboplatin for AUC of 4 on day 1, etoposide 80 Mg/M2 on days 1, 2 and 3 with Imfinzi 240 Mg/M2 on the days before chemotherapy and Imfinzi 1500 Mg IV on day 1 every 3 weeks.  Status post 22 cycles.  Starting from cycle #5 he will be on maintenance treatment with immunotherapy with Imfinzi 1500 Mg IV every 4 weeks.  He has been tolerating this treatment fairly well. He is also undergoing palliative radiotherapy to enlarging gastrohepatic ligament lymph node under the care of Dr. Roselind Messier.  He is expected to complete this palliative radiation on August 26, 2023.     Extensive Stage Small Cell Lung Cancer Diagnosed in June 2023. S/P Palliative systemic chemotherapy with carboplatin initiated for AUC of 4 on day 1 and etoposide 80 Mg/M2 on days 1, 2 and 3 with Cosela before the chemotherapy and Imfinzi 1500 Mg IV on day 1 every 3 weeks.  Status post 22 cycles.  Starting from cycle #5 the patient will be on maintenance treatment with Imfinzi 1500 Mg IV every 4 weeks. Currently on effective maintenance therapy. Blood work shows mild anemia (hemoglobin 12.8). No new symptoms reported. - Continue current maintenance therapy - Order follow-up scan in two  months  Chronic Obstructive Pulmonary Disease (COPD) Severe emphysema. Reports good breathing and uses nocturnal oxygen. Oxygen levels dropped to 93% post-exertion. Pulmonologist prescribed unaffordable medication. Need to consult for affordable alternatives ensuring no interaction with current immunotherapy. - Consult pulmonologist for affordable COPD medication alternatives  General Health Maintenance Blood work shows mild anemia. - Monitor hemoglobin levels  Follow-up - Schedule follow-up appointment as per  usual protocol.   The patient was advised to call immediately if he has any other concerning symptoms in the interval. The patient voices understanding of current disease status and treatment options and is in agreement with the current care plan.  All questions were answered. The patient knows to call the clinic with any problems, questions or concerns. We can certainly see the patient much sooner if necessary.  The total time spent in the appointment was 20 minutes.  Disclaimer: This note was dictated with voice recognition software. Similar sounding words can inadvertently be transcribed and may not be corrected upon review.

## 2023-09-14 NOTE — Patient Instructions (Signed)
[facility] - A DEPT OF [facility]  Discharge Instructions: Thank you for choosing [facility] to provide your oncology and hematology care.   If you have a lab appointment with the Cancer Center, please go directly to the Cancer Center and check in at the registration area.   Wear comfortable clothing and clothing appropriate for easy access to any Portacath or PICC line.   We strive to give you quality time with your provider. You may need to reschedule your appointment if you arrive late (15 or more minutes).  Arriving late affects you and other patients whose appointments are after yours.  Also, if you miss three or more appointments without notifying the office, you may be dismissed from the clinic at the provider's discretion.      For prescription refill requests, have your pharmacy contact our office and allow 72 hours for refills to be completed.    Today you received the following chemotherapy and/or immunotherapy agents: Durvalumab       To help prevent nausea and vomiting after your treatment, we encourage you to take your nausea medication as directed.  BELOW ARE SYMPTOMS THAT SHOULD BE REPORTED IMMEDIATELY: *FEVER GREATER THAN 100.4 F (38 C) OR HIGHER *CHILLS OR SWEATING *NAUSEA AND VOMITING THAT IS NOT CONTROLLED WITH YOUR NAUSEA MEDICATION *UNUSUAL SHORTNESS OF BREATH *UNUSUAL BRUISING OR BLEEDING *URINARY PROBLEMS (pain or burning when urinating, or frequent urination) *BOWEL PROBLEMS (unusual diarrhea, constipation, pain near the anus) TENDERNESS IN MOUTH AND THROAT WITH OR WITHOUT PRESENCE OF ULCERS (sore throat, sores in mouth, or a toothache) UNUSUAL RASH, SWELLING OR PAIN  UNUSUAL VAGINAL DISCHARGE OR ITCHING   Items with * indicate a potential emergency and should be followed up as soon as possible or go to the Emergency Department if any problems should occur.  Please show the CHEMOTHERAPY ALERT CARD or  IMMUNOTHERAPY ALERT CARD at check-in to the Emergency Department and triage nurse.  Should you have questions after your visit or need to cancel or reschedule your appointment, please contact [facility] - A DEPT OF [facility]  Dept: [phone]  and follow the prompts.  Office hours are 8:00 a.m. to 4:30 p.m. Monday - Friday. Please note that voicemails left after 4:00 p.m. may not be returned until the following business day.  We are closed weekends and major holidays. You have access to a nurse at all times for urgent questions. Please call the main number to the clinic Dept: [phone] and follow the prompts.   For any non-urgent questions, you may also contact your provider using MyChart. We now offer e-Visits for anyone 24 and older to request care online for non-urgent symptoms. For details visit [email or web address].   Also download the MyChart app! Go to the app store, search "MyChart", open the app, select [place], and log in with your MyChart username and password.

## 2023-09-15 NOTE — Telephone Encounter (Signed)
Pharmacy Patient Advocate Encounter  Received notification from [facility] that Prior Authorization for Spiriva Respimat 1.25MCG/ACT aerosol  has been DENIED.  Full denial letter will be uploaded to the media tab. See denial reason below.   PA #/Case ID/Reference #: Denied. This drug used for CENTRILOBULAR EMPHYSEMA is not an approved use. Medicare Part D rules states the drug must be used for a "medically-accepted indication". A "medically-accepted indication" means a use that is approved by the Food and Drug Administration (FDA), or a use supported by specific resources. These are the [facility] Formulary Service Drug Information and DRUGDEX Information System. Therefore, this drug cannot be covered under your Medicare Part D benefit.

## 2023-09-20 NOTE — Addendum Note (Signed)
Encounter addended by: Ronny Bacon, PA-C on: 09/20/2023 8:00 AM  Actions taken: Clinical Note Signed

## 2023-09-23 ENCOUNTER — Other Ambulatory Visit: Payer: Self-pay

## 2023-09-29 ENCOUNTER — Telehealth: Payer: Self-pay | Admitting: Internal Medicine

## 2023-10-01 NOTE — Telephone Encounter (Signed)
I do not see a [facility] cares for him.  Instead of something from [facility] where they denied his Spiriva because of emphysema diagnosis.  I can change this in my note to COPD which have done.  Of left the paperwork for  [facility]

## 2023-10-07 NOTE — Progress Notes (Signed)
 [facility] OFFICE PROGRESS NOTE  [name], MD [address] [place] [address]  DIAGNOSIS: Extensive stage (T3, N2, M1b) small cell lung cancer presented with obstructive left upper lobe lung mass in addition to mediastinal lymphadenopathy and malignant left pleural effusion as well as pleural-based metastasis as well as suspicious liver metastasis diagnosed in June 2023.   PRIOR THERAPY: palliative radiotherapy to enlarging gastrohepatic ligament lymph node under the care of Dr. Roselind Messier completed early January 2025.   CURRENT THERAPY: Palliative systemic chemotherapy with carboplatin initiated for AUC of 4 on day 1 and etoposide 80 Mg/M2 on days 1, 2 and 3 with Cosela before the chemotherapy and Imfinzi 1500 Mg IV on day 1 every 3 weeks.  Status post 23 cycles.  Starting from cycle #5 the patient will be on maintenance treatment with Imfinzi 1500 Mg IV every 4 weeks.   INTERVAL HISTORY: Pedro Pope. 83 y.o. male returns to the clinic today for a follow-up visit accompanied by his wife.  The patient was last seen by Dr. Arbutus Ped 4 weeks ago.    Otherwise he denies any major changes in his health since he was last seen except sometimes at night he may get a right sided abdominal discomfort. He states the pain is severe. It occurs more when he lays down or sits up. He typically does not need to take anything for the pain but has taken tylenol before and it helps. He denies any associated symptoms such as changes in his urinary practices such as hematuria, malodorous urine, or dysuria.  His scans do typically show nonobstructing right-sided kidney stones.  He denies any GI symptoms such as nausea, vomiting, diarrhea, constipation, jaundice, or fevers.  His appetite is improving and he has gained weight.  He is not having any pain at this time. The patient did recently have radiation to the right abdomen last month.    He denies any fever, chills, r night sweats.  May get  mild dyspnea on exertion secondary to his emphysema.  The patient sometimes has cough secondary to nasal congestion.  He does not take an antihistamine or Mucinex.  He denies any hemoptysis.  He sometimes gets a rash on his right forehead and right side of his neck from his immunotherapy for which he uses hydrocortisone cream.  He is here today for evaluation repeat blood work before undergoing cycle #24.    MEDICAL HISTORY: Past Medical History:  Diagnosis Date   Anosmia    CAD (coronary artery disease), native coronary artery    a. 10/29/2013 antlat STEMI s/p DES to pLAD; residual 80 % proximal and 60% mid LCx diease   Chronic headaches    Dressler's syndrome (HCC)    a. placed on colchicine    ED (erectile dysfunction)    Elevated TSH    Fatty liver    Gout    Ischemic cardiomyopathy    a. 2D ECHO 11/04/14 w/ EF 40-45% with LV WMA, G2DD, mild MR, mild LA dilation, mod TR.   PAF (paroxysmal atrial fibrillation) (HCC)    a. Dx on 11/03/14 admission. Not placed on longterm AC due to DAPT w/ receent DES. Will plan for outpt heart monitor    Pre-diabetes    a. HgA1c 6.1 in 10/2014   Prostate cancer [facility])    s/p prostatectomy and XRT   Rheumatic fever 08[birth year]18[birth year]1945   Small cell lung cancer (HCC) 01/2022    ALLERGIES:  is allergic to penicillins, clindamycin, corn syrup [  glucose], and aspirin adult low [aspirin].  MEDICATIONS:  Current Outpatient Medications  Medication Sig Dispense Refill   acetaminophen (TYLENOL) 500 MG tablet Take 500 mg by mouth every 6 (six) hours as needed for mild pain or moderate pain.     albuterol (VENTOLIN HFA) 108 (90 Base) MCG/ACT inhaler Inhale 1-2 puffs into the lungs every 6 (six) hours as needed. 8 g 2   ascorbic acid (VITAMIN C) 500 MG tablet Take 500 mg by mouth 2 (two) times daily.     Blood Pressure Monitor KIT For daily blood pressure monitoring;  Arm Cuff 1 each 0   carvedilol (COREG) 3.125 MG tablet TAKE 1 TABLET(3.125 MG) BY MOUTH TWICE DAILY WITH  A MEAL (Patient taking differently: once.) 180 tablet 3   escitalopram (LEXAPRO) 5 MG tablet Take 5 mg by mouth daily.     hydrocortisone 1 % lotion Apply 1 Application topically 2 (two) times daily. 118 mL 0   hydrOXYzine (ATARAX) 10 MG tablet Take 1 tablet (10 mg total) by mouth 3 (three) times daily as needed. 30 tablet 0   PROCTO-MED HC 2.5 % rectal cream Apply topically.     Tiotropium Bromide Monohydrate (SPIRIVA RESPIMAT) 1.25 MCG/ACT AERS Inhale 2 puffs into the lungs daily. 1 each 3   Tiotropium Bromide Monohydrate (SPIRIVA RESPIMAT) 1.25 MCG/ACT AERS Inhale 2 puffs into the lungs daily.     triamcinolone cream (KENALOG) 0.1 % Apply 1 Application topically 2 (two) times daily. 80 g 0   No current facility-administered medications for this visit.   Facility-Administered Medications Ordered in Other Visits  Medication Dose Route Frequency Provider Last Rate Last Admin   sodium chloride flush (NS) 0.9 % injection 10 mL  10 mL Intracatheter Once Si Gaul, MD        SURGICAL HISTORY:  Past Surgical History:  Procedure Laterality Date   CORONARY ANGIOPLASTY WITH STENT PLACEMENT  10/30/14   STEMI s/p DES to proximal LAD; residual 80 % proximal and 60% mid LCx diease   IR IMAGING GUIDED PORT INSERTION  03/12/2022   IR PATIENT EVAL TECH 0-60 MINS  10/20/2022   IR PATIENT EVAL TECH 0-60 MINS  10/27/2022   IR PATIENT EVAL TECH 0-60 MINS  10/30/2022   IR PATIENT EVAL TECH 0-60 MINS  10/23/2022   IR PATIENT EVAL TECH 0-60 MINS  11/06/2022   IR PATIENT EVAL TECH 0-60 MINS  11/13/2022   IR PATIENT EVAL TECH 0-60 MINS  12/01/2022   IR RADIOLOGIST EVAL & MGMT  10/02/2022   IR RADIOLOGIST EVAL & MGMT  10/13/2022   IR RADIOLOGIST EVAL & MGMT  10/14/2022   IR RADIOLOGIST EVAL & MGMT  10/17/2022   IR RADIOLOGIST EVAL & MGMT  10/20/2022   IR REMOVAL TUN ACCESS W/ PORT W/O FL MOD SED  10/13/2022   LEFT HEART CATHETERIZATION WITH CORONARY ANGIOGRAM N/A 10/30/2014   Procedure: LEFT HEART CATHETERIZATION WITH  CORONARY ANGIOGRAM;  Surgeon: Runell Gess, MD;  Location: [facility] CATH LAB;  Service: Cardiovascular;  Laterality: N/A;   PERCUTANEOUS CORONARY STENT INTERVENTION (PCI-S)  10/30/2014   Procedure: PERCUTANEOUS CORONARY STENT INTERVENTION (PCI-S);  Surgeon: Runell Gess, MD;  Location: [facility] CATH LAB;  Service: Cardiovascular;;   PROSTATECTOMY     THORACENTESIS Left 12/31/2021   Procedure: THORACENTESIS;  Surgeon: Lorin Glass, MD;  Location: [facility] ENDOSCOPY;  Service: Pulmonary;  Laterality: Left;   TONSILLECTOMY     VIDEO ASSISTED THORACOSCOPY (VATS)/DECORTICATION Left 01/21/2022   Procedure: VIDEO ASSISTED THORACOSCOPY (  VATS)/DECORTICATION;  Surgeon: Corliss Skains, MD;  Location: [facility] OR;  Service: Thoracic;  Laterality: Left;    REVIEW OF SYSTEMS:   Constitutional: Positive for stable fatigue. Negative for appetite change, chills,  fever and unexpected weight change.  HENT: Negative for mouth sores, nosebleeds, sore throat and trouble swallowing.   Eyes: Negative for eye problems and icterus.  Respiratory: Positive for mild dyspnea on exertion. Negative for cough, hemoptysis, and wheezing.   Cardiovascular: Negative for chest pain and leg swelling.  Gastrointestinal: Positive for intermittent right lateral abdominal discomfort (none at this time). Negative for constipation, diarrhea, nausea and vomiting.  Genitourinary: Negative for bladder incontinence, difficulty urinating, dysuria, frequency and hematuria.   Musculoskeletal: Negative for back pain, gait problem, neck pain and neck stiffness.  Skin: Positive for improved rash on right forehead and right neck.  Neurological: Negative for dizziness, extremity weakness, gait problem, headaches, light-headedness and seizures.  Hematological: Negative for adenopathy. Does not bruise/bleed easily.  Psychiatric/Behavioral:  Negative for confusion, depression and sleep disturbance. The patient is not nervous/anxious.   PHYSICAL EXAMINATION:   Blood pressure (!) 113/53, pulse (!) 53, temperature 98.3 F (36.8 C), temperature source Tympanic, resp. rate 18, height 5\' 9"  (1.753 m), weight 150 lb 1.6 oz (68.1 kg), SpO2 98%.  ECOG PERFORMANCE STATUS: 1  Physical Exam  Constitutional: Oriented to person, place, and time and well-developed, well-nourished, and in no distress.  HENT:  Head: Normocephalic and atraumatic.  Mouth/Throat: Oropharynx is clear and moist. No oropharyngeal exudate.  Eyes: Conjunctivae are normal. Right eye exhibits no discharge. Left eye exhibits no discharge. No scleral icterus.  Neck: Normal range of motion. Neck supple.  Cardiovascular: Normal rate, regular rhythm, normal heart sounds and intact distal pulses.   Pulmonary/Chest: Effort normal and breath sounds normal. No respiratory distress. No wheezes. No rales.  Abdominal: Soft. Bowel sounds are normal. Exhibits no distension and no mass. There is no tenderness.  Musculoskeletal: Normal range of motion. Exhibits no edema. No CVA tenderness.  Lymphadenopathy:    No cervical adenopathy.  Neurological: Alert and oriented to person, place, and time. Exhibits normal muscle tone. Gait normal. Coordination normal.  Skin: Skin is warm and dry. No rash noted. Not diaphoretic. No erythema. No pallor.  Psychiatric: Mood, memory and judgment normal.  Vitals reviewed.  LABORATORY DATA: Lab Results  Component Value Date   WBC 6.9 10/12/2023   HGB 12.0 (L) 10/12/2023   HCT 36.1 (L) 10/12/2023   MCV 91.6 10/12/2023   PLT 170 10/12/2023      Chemistry      Component Value Date/Time   NA 141 10/12/2023 1138   K 3.9 10/12/2023 1138   CL 110 10/12/2023 1138   CO2 26 10/12/2023 1138   BUN 19 10/12/2023 1138   CREATININE 0.96 10/12/2023 1138      Component Value Date/Time   CALCIUM 8.7 (L) 10/12/2023 1138   ALKPHOS 68 10/12/2023 1138   AST 20 10/12/2023 1138   ALT 11 10/12/2023 1138   BILITOT 0.4 10/12/2023 1138       RADIOGRAPHIC STUDIES:  No  results found.   ASSESSMENT/PLAN:  This is a very pleasant 83 year old Caucasian male diagnosed with extensive stage (T3, N2, M1 a) small cell lung cancer.  He presented with an obstructive left upper lobe lung mass in addition to mediastinal lymphadenopathy.  He also has a malignant left pleural effusion and pleural-based metastases.  He also has a suspicious liver metastasis.  He was diagnosed in June 2023.  The patient is currently undergoing slightly reduced dose systemic chemotherapy with carboplatin for an AUC of 4 on day 1, etoposide 80 mg per metered squared on days 1 2, and 3 with Imfinzi on day 1 every 3 weeks.  He is status post 23 cycles.  Starting from cycle #5, the patient started maintenance immunotherapy with Imfinzi IV every 4 weeks.  Labs were reviewed. Recommend he proceed with cycle #24 today as scheduled.   I will arrange for a restaging CT scan prior to his next cycle of treatment.  The patient is not having any abdominal pain at this time and he is well-appearing today.  There was no tenderness to palpation and no CVA tenderness.  I told him to monitor his abdominal symptoms at this time and take Tylenol if needed, reposition, and drink plenty of fluid.  If his pain becomes more frequent and does not improve as anticipated, let him know he may schedule his CT scan at his earliest convenience which will include an abdomen and pelvis.  Should he develop any concerning symptoms such as fevers, abdominal pain that persist, increasing pain, GI symptoms, or urinary symptoms, he knows to seek evaluation sooner.  The patient was advised to call immediately if he has any concerning symptoms in the interval. The patient voices understanding of current disease status and treatment options and is in agreement with the current care plan. All questions were answered. The patient knows to call the clinic with any problems, questions or concerns. We can certainly see the patient much sooner if  necessary      Orders Placed This Encounter  Procedures   CT CHEST ABDOMEN PELVIS W CONTRAST    Standing Status:   Future    Expected Date:   10/29/2023    Expiration Date:   10/11/2024    If indicated for the ordered procedure, I authorize the administration of contrast media per Radiology protocol:   Yes    Does the patient have a contrast media/X-ray dye allergy?:   No    Preferred imaging location?:   [facility]    If indicated for the ordered procedure, I authorize the administration of oral contrast media per Radiology protocol:   Yes   CBC with Differential (Cancer Center Only)    Standing Status:   Future    Expected Date:   11/09/2023    Expiration Date:   11/08/2024   CMP (Cancer Center only)    Standing Status:   Future    Expected Date:   11/09/2023    Expiration Date:   11/08/2024   CBC with Differential (Cancer Center Only)    Standing Status:   Future    Expected Date:   12/07/2023    Expiration Date:   12/06/2024   CMP (Cancer Center only)    Standing Status:   Future    Expected Date:   12/07/2023    Expiration Date:   12/06/2024   CBC with Differential (Cancer Center Only)    Standing Status:   Future    Expected Date:   01/04/2024    Expiration Date:   01/03/2025   CMP (Cancer Center only)    Standing Status:   Future    Expected Date:   01/04/2024    Expiration Date:   01/03/2025   CBC with Differential (Cancer Center Only)    Standing Status:   Future    Expected Date:   02/01/2024    Expiration Date:  01/31/2025   CMP (Cancer Center only)    Standing Status:   Future    Expected Date:   02/01/2024    Expiration Date:   01/31/2025   CBC with Differential (Cancer Center Only)    Standing Status:   Future    Expected Date:   02/29/2024    Expiration Date:   02/28/2025   CMP (Cancer Center only)    Standing Status:   Future    Expected Date:   02/29/2024    Expiration Date:   02/28/2025   CBC with Differential (Cancer Center Only)    Standing Status:    Future    Expected Date:   03/28/2024    Expiration Date:   03/28/2025   CMP (Cancer Center only)    Standing Status:   Future    Expected Date:   03/28/2024    Expiration Date:   03/28/2025   The total time spent in the appointment was 20-29 minutes  Tyeshia Cornforth L Dafna Romo, PA-C 10/12/23

## 2023-10-12 ENCOUNTER — Ambulatory Visit: Payer: Medicare Other

## 2023-10-12 ENCOUNTER — Encounter: Payer: Self-pay | Admitting: Radiation Oncology

## 2023-10-12 ENCOUNTER — Inpatient Hospital Stay (HOSPITAL_BASED_OUTPATIENT_CLINIC_OR_DEPARTMENT_OTHER): Payer: Medicare Other | Admitting: Physician Assistant

## 2023-10-12 ENCOUNTER — Ambulatory Visit
Admission: RE | Admit: 2023-10-12 | Discharge: 2023-10-12 | Disposition: A | Discharge status: Home / Self Care | Payer: Medicare Other | Source: Ambulatory Visit | Attending: Radiation Oncology | Admitting: Radiation Oncology

## 2023-10-12 ENCOUNTER — Inpatient Hospital Stay: Payer: Medicare Other | Attending: Internal Medicine

## 2023-10-12 VITALS — BP 109/42 | HR 55 | Temp 97.7°F | Resp 16

## 2023-10-12 VITALS — BP 113/53 | HR 53 | Temp 98.3°F | Resp 18 | Ht 69.0 in | Wt 150.1 lb

## 2023-10-12 VITALS — BP 123/62 | HR 59 | Temp 97.8°F | Resp 18 | Ht 69.0 in | Wt 151.4 lb

## 2023-10-12 DIAGNOSIS — C3412 Malignant neoplasm of upper lobe, left bronchus or lung: Secondary | ICD-10-CM | POA: Diagnosis present

## 2023-10-12 DIAGNOSIS — C3482 Malignant neoplasm of overlapping sites of left bronchus and lung: Secondary | ICD-10-CM | POA: Insufficient documentation

## 2023-10-12 DIAGNOSIS — C782 Secondary malignant neoplasm of pleura: Secondary | ICD-10-CM | POA: Diagnosis not present

## 2023-10-12 DIAGNOSIS — C772 Secondary and unspecified malignant neoplasm of intra-abdominal lymph nodes: Secondary | ICD-10-CM | POA: Insufficient documentation

## 2023-10-12 DIAGNOSIS — C787 Secondary malignant neoplasm of liver and intrahepatic bile duct: Secondary | ICD-10-CM | POA: Diagnosis not present

## 2023-10-12 DIAGNOSIS — Z5112 Encounter for antineoplastic immunotherapy: Secondary | ICD-10-CM | POA: Diagnosis not present

## 2023-10-12 DIAGNOSIS — Z923 Personal history of irradiation: Secondary | ICD-10-CM | POA: Insufficient documentation

## 2023-10-12 LAB — CBC WITH DIFFERENTIAL (CANCER CENTER ONLY)
Abs Immature Granulocytes: 0.02 10*3/uL (ref 0.00–0.07)
Basophils Absolute: 0 10*3/uL (ref 0.0–0.1)
Basophils Relative: 1 %
Eosinophils Absolute: 0.4 10*3/uL (ref 0.0–0.5)
Eosinophils Relative: 6 %
HCT: 36.1 % — ABNORMAL LOW (ref 39.0–52.0)
Hemoglobin: 12 g/dL — ABNORMAL LOW (ref 13.0–17.0)
Immature Granulocytes: 0 %
Lymphocytes Relative: 20 %
Lymphs Abs: 1.4 10*3/uL (ref 0.7–4.0)
MCH: 30.5 pg (ref 26.0–34.0)
MCHC: 33.2 g/dL (ref 30.0–36.0)
MCV: 91.6 fL (ref 80.0–100.0)
Monocytes Absolute: 0.7 10*3/uL (ref 0.1–1.0)
Monocytes Relative: 10 %
Neutro Abs: 4.3 10*3/uL (ref 1.7–7.7)
Neutrophils Relative %: 63 %
Platelet Count: 170 10*3/uL (ref 150–400)
RBC: 3.94 MIL/uL — ABNORMAL LOW (ref 4.22–5.81)
RDW: 13.7 % (ref 11.5–15.5)
WBC Count: 6.9 10*3/uL (ref 4.0–10.5)
nRBC: 0 % (ref 0.0–0.2)

## 2023-10-12 LAB — CMP (CANCER CENTER ONLY)
ALT: 11 U/L (ref 0–44)
AST: 20 U/L (ref 15–41)
Albumin: 3.7 g/dL (ref 3.5–5.0)
Alkaline Phosphatase: 68 U/L (ref 38–126)
Anion gap: 5 (ref 5–15)
BUN: 19 mg/dL (ref 8–23)
CO2: 26 mmol/L (ref 22–32)
Calcium: 8.7 mg/dL — ABNORMAL LOW (ref 8.9–10.3)
Chloride: 110 mmol/L (ref 98–111)
Creatinine: 0.96 mg/dL (ref 0.61–1.24)
GFR, Estimated: >60 mL/min (ref >60)
Glucose, Bld: 101 mg/dL — ABNORMAL HIGH (ref 70–99)
Potassium: 3.9 mmol/L (ref 3.5–5.1)
Sodium: 141 mmol/L (ref 135–145)
Total Bilirubin: 0.4 mg/dL (ref 0.0–1.2)
Total Protein: 6.2 g/dL — ABNORMAL LOW (ref 6.5–8.1)

## 2023-10-12 MED ORDER — SODIUM CHLORIDE 0.9 % IV SOLN
1500.0000 mg | Freq: Once | INTRAVENOUS | Status: AC
  Administered 2023-10-12: 1500 mg via INTRAVENOUS
  Filled 2023-10-12: qty 30

## 2023-10-12 MED ORDER — SODIUM CHLORIDE 0.9 % IV SOLN: Freq: Once | INTRAVENOUS | Status: AC

## 2023-10-12 NOTE — Progress Notes (Signed)
 Radiation Oncology         [phone]) [phone] ________________________________  Name: Pedro Pope. MRN: [id number]  Date: 10/12/2023  DOB: 03-01-1941  Follow-Up Visit Note  CC: [name], MD  Si Gaul, MD  No diagnosis found.  Diagnosis:  The encounter diagnosis was Small cell lung cancer, overlapping sites of left lung (HCC).   Extensive stage (T3, N2, M1b) small cell lung cancer presented with obstructive left upper lobe lung mass in addition to mediastinal lymphadenopathy and malignant left pleural effusion as well as pleural-based metastasis as well as suspicious liver metastasis diagnosed in June 2023: s/p chemotherapy now on maintenance immunotherapy with imfinzi      Interval Since Last Radiation: 1 month and 16 days   Indication for treatment: Curative       Radiation treatment dates: First Treatment Date: 2023-08-14 - Last Treatment Date: 2023-08-26 Site/Dose/Technique/Mode:  Site: Abdomen Technique: SBRT/SRT-IMRT Mode: Photon Dose Per Fraction: 8 Gy Prescribed Dose (Delivered / Prescribed): 40 Gy / 40 Gy Prescribed Fxs (Delivered / Prescribed): 5 / 5  Narrative:  The patient returns today for routine follow-up. She tolerated radiation treatment relatively well without any significant side effects.   He has continued to receive maintenance immunotherapy consisting of imfinzi under the care of Dr. Arbutus Ped which he continues to tolerate well without any side effects. Per his most recent visit with Dr. Arbutus Ped on 09/14/23, the patient was noted to me mildly anemic which he is asymptomatic of.   He also followed up with his pulmonologist Dr. Marchelle Gearing on 01/21 for management of his COPD. He continues to use oxygen at night and reportedly has been breathing fine. He did however have an episode where his O2 sats dropped down to 93% post-exertion. Dr. Marchelle Gearing has apparently recommended a COPD medication to the patient which is out of his price range (not covered by his  insurance), and he is working with his insurance company and Dr. Marchelle Gearing to figure out a more affordable / reasonable alternative to this that will not interact with his immunotherapy.    No other significant oncologic interval history since the patient was last seen for follow-up.   ***                                Allergies:  is allergic to penicillins, clindamycin, corn syrup [glucose], and aspirin adult low [aspirin].  Meds: Current Outpatient Medications  Medication Sig Dispense Refill   acetaminophen (TYLENOL) 500 MG tablet Take 500 mg by mouth every 6 (six) hours as needed for mild pain or moderate pain.     albuterol (VENTOLIN HFA) 108 (90 Base) MCG/ACT inhaler Inhale 1-2 puffs into the lungs every 6 (six) hours as needed. 8 g 2   ascorbic acid (VITAMIN C) 500 MG tablet Take 500 mg by mouth 2 (two) times daily.     Blood Pressure Monitor KIT For daily blood pressure monitoring;  Arm Cuff 1 each 0   carvedilol (COREG) 3.125 MG tablet TAKE 1 TABLET(3.125 MG) BY MOUTH TWICE DAILY WITH A MEAL (Patient taking differently: once.) 180 tablet 3   escitalopram (LEXAPRO) 5 MG tablet Take 5 mg by mouth daily.     hydrocortisone 1 % lotion Apply 1 Application topically 2 (two) times daily. 118 mL 0   hydrOXYzine (ATARAX) 10 MG tablet Take 1 tablet (10 mg total) by mouth 3 (three) times daily as needed. 30 tablet 0   PROCTO-MED  HC 2.5 % rectal cream Apply topically.     Tiotropium Bromide Monohydrate (SPIRIVA RESPIMAT) 1.25 MCG/ACT AERS Inhale 2 puffs into the lungs daily. 1 each 3   Tiotropium Bromide Monohydrate (SPIRIVA RESPIMAT) 1.25 MCG/ACT AERS Inhale 2 puffs into the lungs daily.     triamcinolone cream (KENALOG) 0.1 % Apply 1 Application topically 2 (two) times daily. 80 g 0   No current facility-administered medications for this encounter.   Facility-Administered Medications Ordered in Other Encounters  Medication Dose Route Frequency Provider Last Rate Last Admin   sodium  chloride flush (NS) 0.9 % injection 10 mL  10 mL Intracatheter Once Si Gaul, MD        Physical Findings: The patient is in no acute distress. Patient is alert and oriented.  vitals were not taken for this visit. .  No significant changes. Lungs are clear to auscultation bilaterally. Heart has regular rate and rhythm. No palpable cervical, supraclavicular, or axillary adenopathy. Abdomen soft, non-tender, normal bowel sounds.   Lab Findings: Lab Results  Component Value Date   WBC 8.0 09/14/2023   HGB 12.8 (L) 09/14/2023   HCT 39.3 09/14/2023   MCV 93.1 09/14/2023   PLT 165 09/14/2023    Radiographic Findings: No results found.  Impression:  Extensive stage (T3, N2, M1b) small cell lung cancer presented with obstructive left upper lobe lung mass in addition to mediastinal lymphadenopathy and malignant left pleural effusion as well as pleural-based metastasis as well as suspicious liver metastasis diagnosed in June 2023: s/p chemotherapy now on maintenance immunotherapy with imfinzi      The patient is recovering from the effects of radiation.  ***  Plan:  ***   *** minutes of total time was spent for this patient encounter, including preparation, face-to-face counseling with the patient and coordination of care, physical exam, and documentation of the encounter. ____________________________________  Billie Lade, PhD, MD  This document serves as a record of services personally performed by Antony Blackbird, MD. It was created on his behalf by Neena Rhymes, a trained medical scribe. The creation of this record is based on the scribe's personal observations and the provider's statements to them. This document has been checked and approved by the attending provider.

## 2023-10-12 NOTE — Progress Notes (Signed)
  Radiation Oncology         [phone]) [phone] ________________________________  Name: Pedro Pope. MRN: [id number]  Date: 10/12/2023  DOB: [birth year]  End of Treatment Note  Diagnosis: The encounter diagnosis was Small cell lung cancer, overlapping sites of left lung (HCC).   Extensive stage (T3, N2, M1b) small cell lung cancer presented with obstructive left upper lobe lung mass in addition to mediastinal lymphadenopathy and malignant left pleural effusion as well as pleural-based metastasis as well as suspicious liver metastasis diagnosed in June 2023: s/p chemotherapy now on maintenance immunotherapy with imfinzi      Indication for treatment: Curative        Radiation treatment dates: First Treatment Date: 2023-08-14 - Last Treatment Date: 2023-08-26  Site/Dose/Technique/Mode:   Site: Abdomen Technique: SBRT/SRT-IMRT Mode: Photon Dose Per Fraction: 8 Gy Prescribed Dose (Delivered / Prescribed): 40 Gy / 40 Gy Prescribed Fxs (Delivered / Prescribed): 5 / 5  Narrative: The patient tolerated radiation treatment relatively well without any significant side effects.   Plan: The patient has completed radiation treatment. The patient will return to radiation oncology clinic for routine followup in one month. I advised them to call or return sooner if they have any questions or concerns related to their recovery or treatment.  -----------------------------------  Billie Lade, PhD, MD  This document serves as a record of services personally performed by Antony Blackbird, MD. It was created on his behalf by Neena Rhymes, a trained medical scribe. The creation of this record is based on the scribe's personal observations and the provider's statements to them. This document has been checked and approved by the attending provider.

## 2023-10-12 NOTE — Progress Notes (Signed)
 Pedro Pope. is here today for follow up post radiation to the abdomen.  They completed their radiation on: 08/26/23   Does the patient complain of any of the following:  Pain: Yes,right side abdominal pain. Rates 4/10. Patient was told he could potentially have kidney stones.  Abdominal bloating: Yes Diarrhea/Constipation: No Nausea/Vomiting: No Appetite: Good Energy Level: Fair Post radiation skin changes: No   Additional comments if applicable:   BP 123/62 (BP Location: Right Arm, Patient Position: Sitting, Cuff Size: Normal)   Pulse (!) 59   Temp 97.8 F (36.6 C)   Resp 18   Ht 5\' 9"  (1.753 m)   Wt 151 lb 6.4 oz (68.7 kg)   SpO2 95%   BMI 22.36 kg/m

## 2023-10-12 NOTE — Patient Instructions (Signed)
 [facility] - A DEPT OF [facility]  Discharge Instructions: Thank you for choosing [facility] to provide your oncology and hematology care.   If you have a lab appointment with the Cancer Center, please go directly to the Cancer Center and check in at the registration area.   Wear comfortable clothing and clothing appropriate for easy access to any Portacath or PICC line.   We strive to give you quality time with your provider. You may need to reschedule your appointment if you arrive late (15 or more minutes).  Arriving late affects you and other patients whose appointments are after yours.  Also, if you miss three or more appointments without notifying the office, you may be dismissed from the clinic at the provider's discretion.      For prescription refill requests, have your pharmacy contact our office and allow 72 hours for refills to be completed.    Today you received the following chemotherapy and/or immunotherapy agents: Durvalumab       To help prevent nausea and vomiting after your treatment, we encourage you to take your nausea medication as directed.  BELOW ARE SYMPTOMS THAT SHOULD BE REPORTED IMMEDIATELY: *FEVER GREATER THAN 100.4 F (38 C) OR HIGHER *CHILLS OR SWEATING *NAUSEA AND VOMITING THAT IS NOT CONTROLLED WITH YOUR NAUSEA MEDICATION *UNUSUAL SHORTNESS OF BREATH *UNUSUAL BRUISING OR BLEEDING *URINARY PROBLEMS (pain or burning when urinating, or frequent urination) *BOWEL PROBLEMS (unusual diarrhea, constipation, pain near the anus) TENDERNESS IN MOUTH AND THROAT WITH OR WITHOUT PRESENCE OF ULCERS (sore throat, sores in mouth, or a toothache) UNUSUAL RASH, SWELLING OR PAIN  UNUSUAL VAGINAL DISCHARGE OR ITCHING   Items with * indicate a potential emergency and should be followed up as soon as possible or go to the Emergency Department if any problems should occur.  Please show the CHEMOTHERAPY ALERT CARD or  IMMUNOTHERAPY ALERT CARD at check-in to the Emergency Department and triage nurse.  Should you have questions after your visit or need to cancel or reschedule your appointment, please contact [facility] - A DEPT OF [facility]  Dept: [phone]  and follow the prompts.  Office hours are 8:00 a.m. to 4:30 p.m. Monday - Friday. Please note that voicemails left after 4:00 p.m. may not be returned until the following business day.  We are closed weekends and major holidays. You have access to a nurse at all times for urgent questions. Please call the main number to the clinic Dept: [phone] and follow the prompts.   For any non-urgent questions, you may also contact your provider using MyChart. We now offer e-Visits for anyone 18 and older to request care online for non-urgent symptoms. For details visit [email or web address].   Also download the MyChart app! Go to the app store, search "MyChart", open the app, select [facility], and log in with your MyChart username and password.

## 2023-10-13 ENCOUNTER — Telehealth: Payer: Self-pay | Admitting: Internal Medicine

## 2023-10-13 ENCOUNTER — Encounter: Payer: Self-pay | Admitting: Internal Medicine

## 2023-10-13 NOTE — Telephone Encounter (Signed)
 Scheduled appointments and called the patient to verify appointment details. Patient stated he would call back if there were any issues with the scheduled date/times.

## 2023-10-14 ENCOUNTER — Other Ambulatory Visit: Payer: Self-pay

## 2023-10-14 NOTE — Telephone Encounter (Signed)
 Returning to [employer] as this application is for a non-specialty medication.   Please note that page 5 is missing and DOES require a signature from the provider. If page 5 is not completed then the prescription must be sent electronically to [facility] (refer to checkboxes at the end of section 6 of the application for further information).  Insurance denial letter has been printed and attached to application as it will be needed if the intention is to submit the pt as "insured without coverage".

## 2023-10-14 NOTE — Telephone Encounter (Signed)
 I believe I signed something yesterday and gave it to Vernie Murders

## 2023-10-15 ENCOUNTER — Other Ambulatory Visit: Payer: Self-pay

## 2023-10-15 NOTE — Telephone Encounter (Signed)
 Page 5 was signed and faxed to [facility] Placed in scan folder

## 2023-10-21 ENCOUNTER — Encounter: Payer: Self-pay | Admitting: Internal Medicine

## 2023-10-29 ENCOUNTER — Ambulatory Visit (HOSPITAL_COMMUNITY)
Admission: RE | Admit: 2023-10-29 | Discharge: 2023-10-29 | Disposition: A | Discharge status: Home / Self Care | Payer: Medicare Other | Source: Ambulatory Visit | Attending: Physician Assistant | Admitting: Physician Assistant

## 2023-10-29 DIAGNOSIS — C3482 Malignant neoplasm of overlapping sites of left bronchus and lung: Secondary | ICD-10-CM | POA: Insufficient documentation

## 2023-10-29 DIAGNOSIS — I7102 Dissection of abdominal aorta: Secondary | ICD-10-CM | POA: Diagnosis not present

## 2023-10-29 DIAGNOSIS — R590 Localized enlarged lymph nodes: Secondary | ICD-10-CM | POA: Diagnosis not present

## 2023-10-29 DIAGNOSIS — N200 Calculus of kidney: Secondary | ICD-10-CM | POA: Diagnosis not present

## 2023-10-29 DIAGNOSIS — C3490 Malignant neoplasm of unspecified part of unspecified bronchus or lung: Secondary | ICD-10-CM | POA: Diagnosis not present

## 2023-10-29 MED ORDER — IOHEXOL 300 MG/ML  SOLN
100.0000 mL | Freq: Once | INTRAMUSCULAR | Status: AC | PRN
  Administered 2023-10-29: 100 mL via INTRAVENOUS

## 2023-11-02 ENCOUNTER — Other Ambulatory Visit: Payer: Self-pay | Admitting: Internal Medicine

## 2023-11-09 ENCOUNTER — Inpatient Hospital Stay: Payer: Medicare Other | Attending: Internal Medicine

## 2023-11-09 ENCOUNTER — Inpatient Hospital Stay (HOSPITAL_BASED_OUTPATIENT_CLINIC_OR_DEPARTMENT_OTHER): Payer: Medicare Other | Admitting: Internal Medicine

## 2023-11-09 ENCOUNTER — Inpatient Hospital Stay

## 2023-11-09 VITALS — BP 119/57 | HR 57 | Temp 98.0°F | Resp 17 | Wt 147.7 lb

## 2023-11-09 DIAGNOSIS — Z79620 Long term (current) use of immunosuppressive biologic: Secondary | ICD-10-CM | POA: Insufficient documentation

## 2023-11-09 DIAGNOSIS — C787 Secondary malignant neoplasm of liver and intrahepatic bile duct: Secondary | ICD-10-CM | POA: Insufficient documentation

## 2023-11-09 DIAGNOSIS — J910 Malignant pleural effusion: Secondary | ICD-10-CM | POA: Insufficient documentation

## 2023-11-09 DIAGNOSIS — C3482 Malignant neoplasm of overlapping sites of left bronchus and lung: Secondary | ICD-10-CM

## 2023-11-09 DIAGNOSIS — J439 Emphysema, unspecified: Secondary | ICD-10-CM | POA: Insufficient documentation

## 2023-11-09 DIAGNOSIS — Z5112 Encounter for antineoplastic immunotherapy: Secondary | ICD-10-CM | POA: Insufficient documentation

## 2023-11-09 DIAGNOSIS — C3412 Malignant neoplasm of upper lobe, left bronchus or lung: Secondary | ICD-10-CM | POA: Diagnosis not present

## 2023-11-09 LAB — CMP (CANCER CENTER ONLY)
ALT: 14 U/L (ref 0–44)
AST: 20 U/L (ref 15–41)
Albumin: 3.9 g/dL (ref 3.5–5.0)
Alkaline Phosphatase: 59 U/L (ref 38–126)
Anion gap: 4 — ABNORMAL LOW (ref 5–15)
BUN: 24 mg/dL — ABNORMAL HIGH (ref 8–23)
CO2: 29 mmol/L (ref 22–32)
Calcium: 9 mg/dL (ref 8.9–10.3)
Chloride: 110 mmol/L (ref 98–111)
Creatinine: 0.91 mg/dL (ref 0.61–1.24)
GFR, Estimated: >60 mL/min (ref >60)
Glucose, Bld: 114 mg/dL — ABNORMAL HIGH (ref 70–99)
Potassium: 3.9 mmol/L (ref 3.5–5.1)
Sodium: 143 mmol/L (ref 135–145)
Total Bilirubin: 0.4 mg/dL (ref 0.0–1.2)
Total Protein: 6.5 g/dL (ref 6.5–8.1)

## 2023-11-09 LAB — CBC WITH DIFFERENTIAL (CANCER CENTER ONLY)
Abs Immature Granulocytes: 0.02 10*3/uL (ref 0.00–0.07)
Basophils Absolute: 0 10*3/uL (ref 0.0–0.1)
Basophils Relative: 0 %
Eosinophils Absolute: 0.5 10*3/uL (ref 0.0–0.5)
Eosinophils Relative: 6 %
HCT: 38.5 % — ABNORMAL LOW (ref 39.0–52.0)
Hemoglobin: 12.6 g/dL — ABNORMAL LOW (ref 13.0–17.0)
Immature Granulocytes: 0 %
Lymphocytes Relative: 19 %
Lymphs Abs: 1.4 10*3/uL (ref 0.7–4.0)
MCH: 30.7 pg (ref 26.0–34.0)
MCHC: 32.7 g/dL (ref 30.0–36.0)
MCV: 93.7 fL (ref 80.0–100.0)
Monocytes Absolute: 0.7 10*3/uL (ref 0.1–1.0)
Monocytes Relative: 9 %
Neutro Abs: 4.7 10*3/uL (ref 1.7–7.7)
Neutrophils Relative %: 66 %
Platelet Count: 179 10*3/uL (ref 150–400)
RBC: 4.11 MIL/uL — ABNORMAL LOW (ref 4.22–5.81)
RDW: 13.8 % (ref 11.5–15.5)
WBC Count: 7.2 10*3/uL (ref 4.0–10.5)
nRBC: 0 % (ref 0.0–0.2)

## 2023-11-09 MED ORDER — HEPARIN SOD (PORK) LOCK FLUSH 100 UNIT/ML IV SOLN
500.0000 [IU] | Freq: Once | INTRAVENOUS | Status: DC | PRN
Start: 2023-11-09 — End: 2023-11-09

## 2023-11-09 MED ORDER — SODIUM CHLORIDE 0.9 % IV SOLN
1500.0000 mg | Freq: Once | INTRAVENOUS | Status: AC
  Administered 2023-11-09: 1500 mg via INTRAVENOUS
  Filled 2023-11-09: qty 30

## 2023-11-09 MED ORDER — SODIUM CHLORIDE 0.9 % IV SOLN: Freq: Once | INTRAVENOUS | Status: AC

## 2023-11-09 MED ORDER — SODIUM CHLORIDE 0.9% FLUSH: 10.0000 mL | INTRAVENOUS | Status: DC | PRN

## 2023-11-09 NOTE — Patient Instructions (Signed)

## 2023-11-09 NOTE — Progress Notes (Signed)
 [facility] [phone]   [phone]  OFFICE PROGRESS NOTE  [name], MD [address] [place] [address]  DIAGNOSIS: Extensive stage (T3, N2, M1b) small cell lung cancer presented with obstructive left upper lobe lung mass in addition to mediastinal lymphadenopathy and malignant left pleural effusion as well as pleural-based metastasis as well as suspicious liver metastasis diagnosed in June 2023.  PRIOR THERAPY: None  CURRENT THERAPY:  1) Palliative systemic chemotherapy with carboplatin initiated for AUC of 4 on day 1 and etoposide 80 Mg/M2 on days 1, 2 and 3 with Cosela before the chemotherapy and Imfinzi 1500 Mg IV on day 1 every 3 weeks.  Status post 21 cycles.  Starting from cycle #5 the patient will be on maintenance treatment with Imfinzi 1500 Mg IV every 4 weeks.  2) palliative radiotherapy to enlarging gastrohepatic ligament lymph node under the care of Dr. Roselind Messier expected to be completed on August 26, 2023.  INTERVAL HISTORY: Pedro Pope. 83 y.o. male returns to the clinic today for follow-up visit accompanied by his wife. Discussed the use of AI scribe software for clinical note transcription with the patient, who gave verbal consent to proceed.  History of Present Illness   Pedro Pope. is an 83 year old male with extensive stage small cell lung cancer who presents for evaluation before starting cycle 25 of maintenance treatment. He is accompanied by his wife.  He was diagnosed with extensive stage small cell lung cancer in June 2023. Initially, he received palliative systemic chemotherapy with carboplatin, Etoposide, and Imfinzi for four cycles. Since then, he has been on maintenance treatment with Imfinzi every four weeks and is currently preparing for cycle 25. In January 2025, he underwent palliative radiotherapy for an enlarging gastrohepatic ligament lymph node.  He experiences shortness of breath, particularly  when walking, due to emphysema, which he describes as 'slowing me down a little bit.' He uses a portable oxygen tank during walks, although he does not have a pulse oximeter at home to monitor his oxygen levels.  He inquired about vitamin K, having read a study suggesting it might cure cancer, but he is aware of its blood clotting properties. He is not currently taking vitamin K.        MEDICAL HISTORY: Past Medical History:  Diagnosis Date   Anosmia    CAD (coronary artery disease), native coronary artery    a. 10/29/2013 antlat STEMI s/p DES to pLAD; residual 80 % proximal and 60% mid LCx diease   Chronic headaches    Dressler's syndrome (HCC)    a. placed on colchicine    ED (erectile dysfunction)    Elevated TSH    Fatty liver    Gout    Ischemic cardiomyopathy    a. 2D ECHO 11/04/14 w/ EF 40-45% with LV WMA, G2DD, mild MR, mild LA dilation, mod TR.   PAF (paroxysmal atrial fibrillation) (HCC)    a. Dx on 11/03/14 admission. Not placed on longterm AC due to DAPT w/ receent DES. Will plan for outpt heart monitor    Pre-diabetes    a. HgA1c 6.1 in 10/2014   Prostate cancer [facility])    s/p prostatectomy and XRT   Rheumatic fever 08/18/1945   Small cell lung cancer (HCC) 01/2022    ALLERGIES:  is allergic to penicillins, clindamycin, corn syrup [glucose], and aspirin adult low [aspirin].  MEDICATIONS:  Current Outpatient Medications  Medication Sig Dispense Refill  acetaminophen (TYLENOL) 500 MG tablet Take 500 mg by mouth every 6 (six) hours as needed for mild pain or moderate pain.     albuterol (VENTOLIN HFA) 108 (90 Base) MCG/ACT inhaler Inhale 1-2 puffs into the lungs every 6 (six) hours as needed. 8 g 2   ascorbic acid (VITAMIN C) 500 MG tablet Take 500 mg by mouth 2 (two) times daily.     Blood Pressure Monitor KIT For daily blood pressure monitoring;  Arm Cuff 1 each 0   carvedilol (COREG) 3.125 MG tablet TAKE 1 TABLET(3.125 MG) BY MOUTH TWICE DAILY WITH A MEAL (Patient  taking differently: once.) 180 tablet 3   escitalopram (LEXAPRO) 5 MG tablet Take 5 mg by mouth daily.     hydrocortisone 1 % lotion Apply 1 Application topically 2 (two) times daily. 118 mL 0   hydrOXYzine (ATARAX) 10 MG tablet Take 1 tablet (10 mg total) by mouth 3 (three) times daily as needed. 30 tablet 0   PROCTO-MED HC 2.5 % rectal cream Apply topically.     Tiotropium Bromide Monohydrate (SPIRIVA RESPIMAT) 1.25 MCG/ACT AERS Inhale 2 puffs into the lungs daily. 1 each 3   Tiotropium Bromide Monohydrate (SPIRIVA RESPIMAT) 1.25 MCG/ACT AERS Inhale 2 puffs into the lungs daily.     triamcinolone cream (KENALOG) 0.1 % Apply 1 Application topically 2 (two) times daily. 80 g 0   No current facility-administered medications for this visit.   Facility-Administered Medications Ordered in Other Visits  Medication Dose Route Frequency Provider Last Rate Last Admin   sodium chloride flush (NS) 0.9 % injection 10 mL  10 mL Intracatheter Once Si Gaul, MD        SURGICAL HISTORY:  Past Surgical History:  Procedure Laterality Date   CORONARY ANGIOPLASTY WITH STENT PLACEMENT  10/30/14   STEMI s/p DES to proximal LAD; residual 80 % proximal and 60% mid LCx diease   IR IMAGING GUIDED PORT INSERTION  03/12/2022   IR PATIENT EVAL TECH 0-60 MINS  10/20/2022   IR PATIENT EVAL TECH 0-60 MINS  10/27/2022   IR PATIENT EVAL TECH 0-60 MINS  10/30/2022   IR PATIENT EVAL TECH 0-60 MINS  10/23/2022   IR PATIENT EVAL TECH 0-60 MINS  11/06/2022   IR PATIENT EVAL TECH 0-60 MINS  11/13/2022   IR PATIENT EVAL TECH 0-60 MINS  12/01/2022   IR RADIOLOGIST EVAL & MGMT  10/02/2022   IR RADIOLOGIST EVAL & MGMT  10/13/2022   IR RADIOLOGIST EVAL & MGMT  10/14/2022   IR RADIOLOGIST EVAL & MGMT  10/17/2022   IR RADIOLOGIST EVAL & MGMT  10/20/2022   IR REMOVAL TUN ACCESS W/ PORT W/O FL MOD SED  10/13/2022   LEFT HEART CATHETERIZATION WITH CORONARY ANGIOGRAM N/A 10/30/2014   Procedure: LEFT HEART CATHETERIZATION WITH CORONARY  ANGIOGRAM;  Surgeon: Runell Gess, MD;  Location: [facility] CATH LAB;  Service: Cardiovascular;  Laterality: N/A;   PERCUTANEOUS CORONARY STENT INTERVENTION (PCI-S)  10/30/2014   Procedure: PERCUTANEOUS CORONARY STENT INTERVENTION (PCI-S);  Surgeon: Runell Gess, MD;  Location: [facility] CATH LAB;  Service: Cardiovascular;;   PROSTATECTOMY     THORACENTESIS Left 12/31/2021   Procedure: THORACENTESIS;  Surgeon: Lorin Glass, MD;  Location: [facility] ENDOSCOPY;  Service: Pulmonary;  Laterality: Left;   TONSILLECTOMY     VIDEO ASSISTED THORACOSCOPY (VATS)/DECORTICATION Left 01/21/2022   Procedure: VIDEO ASSISTED THORACOSCOPY (VATS)/DECORTICATION;  Surgeon: Corliss Skains, MD;  Location: [facility] OR;  Service: Thoracic;  Laterality: Left;  REVIEW OF SYSTEMS:  Constitutional: positive for fatigue Eyes: negative Ears, nose, mouth, throat, and face: negative Respiratory: positive for dyspnea on exertion Cardiovascular: negative Gastrointestinal: negative Genitourinary:negative Integument/breast: negative Hematologic/lymphatic: negative Musculoskeletal:negative Neurological: negative Behavioral/Psych: negative Endocrine: negative Allergic/Immunologic: negative   PHYSICAL EXAMINATION: General appearance: alert, cooperative, and no distress Head: Normocephalic, without obvious abnormality, atraumatic Neck: no adenopathy, no JVD, supple, symmetrical, trachea midline, and thyroid not enlarged, symmetric, no tenderness/mass/nodules Lymph nodes: Cervical, supraclavicular, and axillary nodes normal. Resp: clear to auscultation bilaterally Back: symmetric, no curvature. ROM normal. No CVA tenderness. Cardio: regular rate and rhythm, S1, S2 normal, no murmur, click, rub or gallop GI: soft, non-tender; bowel sounds normal; no masses,  no organomegaly Extremities: extremities normal, atraumatic, no cyanosis or edema Neurologic: Alert and oriented X 3, normal strength and tone. Normal symmetric reflexes. Normal  coordination and gait  ECOG PERFORMANCE STATUS: 1 - Symptomatic but completely ambulatory  Blood pressure (!) 119/57, pulse (!) 57, temperature 98 F (36.7 C), temperature source Temporal, resp. rate 17, weight 147 lb 11.2 oz (67 kg), SpO2 98%.  LABORATORY DATA: Lab Results  Component Value Date   WBC 7.2 11/09/2023   HGB 12.6 (L) 11/09/2023   HCT 38.5 (L) 11/09/2023   MCV 93.7 11/09/2023   PLT 179 11/09/2023      Chemistry      Component Value Date/Time   NA 141 10/12/2023 1138   K 3.9 10/12/2023 1138   CL 110 10/12/2023 1138   CO2 26 10/12/2023 1138   BUN 19 10/12/2023 1138   CREATININE 0.96 10/12/2023 1138      Component Value Date/Time   CALCIUM 8.7 (L) 10/12/2023 1138   ALKPHOS 68 10/12/2023 1138   AST 20 10/12/2023 1138   ALT 11 10/12/2023 1138   BILITOT 0.4 10/12/2023 1138       RADIOGRAPHIC STUDIES: CT CHEST ABDOMEN PELVIS W CONTRAST Result Date: 11/08/2023 CLINICAL DATA:  Metastatic disease evaluation, small-cell lung cancer * Tracking Code: BO * EXAM: CT CHEST, ABDOMEN, AND PELVIS WITH CONTRAST TECHNIQUE: Multidetector CT imaging of the chest, abdomen and pelvis was performed following the standard protocol during bolus administration of intravenous contrast. RADIATION DOSE REDUCTION: This exam was performed according to the departmental dose-optimization program which includes automated exposure control, adjustment of the mA and/or kV according to patient size and/or use of iterative reconstruction technique. CONTRAST:  OMNIPAQUE IOHEXOL 300 MG/ML  SOLN COMPARISON:  04/20/2023 FINDINGS: CT CHEST FINDINGS Cardiovascular: Aortic atherosclerosis. Normal heart size. Three-vessel coronary artery calcifications. No pericardial effusion. Mediastinum/Nodes: Unchanged prominent pretracheal lymph node measuring 1.4 x 0.8 cm (series 3, image 27). Thyroid gland, trachea, and esophagus demonstrate no significant findings. Lungs/Pleura: Severe emphysema. Unchanged underlying  bibasilar predominant UIP pattern fibrosis. Unchanged subpleural nodule of the lingula measuring 0.8 cm (series 8, image 29). No pleural effusion or pneumothorax. Musculoskeletal: No chest wall abnormality. No acute osseous findings. CT ABDOMEN PELVIS FINDINGS Hepatobiliary: No solid liver abnormality is seen. No gallstones, gallbladder wall thickening, or biliary dilatation. Pancreas: Unremarkable. No pancreatic ductal dilatation or surrounding inflammatory changes. Spleen: Normal in size without significant abnormality. Adrenals/Urinary Tract: Adrenal glands are unremarkable. Nonobstructive calculus of the inferior pole of the right kidney. No left-sided calculi, ureteral calculi, or hydronephrosis. Left kidney is normal, without renal calculi, solid lesion, or hydronephrosis. Bladder is unremarkable. Stomach/Bowel: Stomach is within normal limits. Appendix appears normal. No evidence of bowel wall thickening, distention, or inflammatory changes. Vascular/Lymphatic: Severe aortic atherosclerosis. Unchanged, chronic, calcified focal dissection of the infrarenal  abdominal aorta (series 3, image 61). Unchanged, chronic occlusion and narrowing of the origin of the left common iliac artery (series 3, image 70). Diminished size of a previously seen enlarged gastrohepatic ligament lymph node, measuring 0.6 cm, previously 1.5 x 1.1 cm (series 3, image 58). Reproductive: Status post prostatectomy Other: Small fat containing umbilical hernia.  No ascites. Musculoskeletal: No acute osseous findings. IMPRESSION: 1. Diminished size of a previously seen enlarged gastrohepatic ligament lymph node, consistent with treatment response. 2. Unchanged prominent pretracheal lymph node, nonspecific, possibly reactive to fibrosis. 3. Unchanged subpleural pulmonary nodule of the lingula measuring 0.8 cm. Attention on follow-up. 4. No new evidence of lymphadenopathy or metastatic disease in the chest, abdomen, or pelvis. 5. Severe  emphysema and underlying bibasilar predominant UIP pattern fibrosis. 6. Status post prostatectomy. 7. Nonobstructive right nephrolithiasis. 8. Coronary artery disease. Aortic Atherosclerosis (ICD10-I70.0) and Emphysema (ICD10-J43.9). Electronically Signed   By: Jearld Lesch M.D.   On: 11/08/2023 21:41     ASSESSMENT AND PLAN: This is a very pleasant 83 years old white male with Extensive stage (T3, N2, M1a) small cell lung cancer presented with obstructive left upper lobe lung mass in addition to mediastinal lymphadenopathy and malignant left pleural effusion as well as pleural-based metastasis and suspicious liver metastasis diagnosed in June 2023. The patient is currently undergoing systemic chemotherapy with carboplatin for AUC of 4 on day 1, etoposide 80 Mg/M2 on days 1, 2 and 3 with Imfinzi 240 Mg/M2 on the days before chemotherapy and Imfinzi 1500 Mg IV on day 1 every 3 weeks.  Status post 24 cycles.  Starting from cycle #5 he will be on maintenance treatment with immunotherapy with Imfinzi 1500 Mg IV every 4 weeks.  He has been tolerating this treatment fairly well. He is also undergoing palliative radiotherapy to enlarging gastrohepatic ligament lymph node under the care of Dr. Roselind Messier.  He is expected to complete this palliative radiation on August 26, 2023. The patient has been tolerating his treatment fairly well with no concerning adverse effect except for mild fatigue. He had repeat CT scan of the chest, abdomen and pelvis performed recently I personally and independently reviewed the scan and discussed the result with the patient today.    Extensive stage small cell lung cancer [age]-year-old male with extensive stage small cell lung cancer, diagnosed in June 2023. Initially received palliative systemic chemotherapy with carboplatin, Etoposide, and Imfinzi for four cycles. Since cycle five, he has been on maintenance treatment with Imfinzi every four weeks. Currently post a total of 24  cycles and here for evaluation before starting cycle 25. Recent imaging shows a reduction in the size of the previously enlarging gastrohepatic ligament lymph node, consistent with a positive response to palliative radiotherapy received in January 2025. Other findings, including paratracheal lymph nodes and subpleural pulmonary nodules, remain unchanged with no new findings, indicating stable disease. He inquired about vitamin K as a cancer treatment, but it was clarified that there is no evidence supporting its efficacy in cancer treatment. - Continue Imfinzi maintenance treatment every four weeks - Proceed with cycle 25 of treatment - Monitor disease status with regular imaging  Emphysema Emphysema is affecting his activity level, causing dyspnea, particularly when walking. Prescribed a handicapped parking permit by his primary care physician to assist with mobility. Oxygen saturation is currently 98%, and he uses a portable oxygen tank during activities such as walking in the park. Emphasized the importance of remaining active and suggested monitoring oxygen levels with a pulse oximeter  at home. - Encourage regular physical activity as tolerated - Purchase a pulse oximeter for home use to monitor oxygen saturation - Use portable oxygen tank as needed based on oxygen saturation levels   Patient was advised to call immediately if he has any concerning symptoms in the interval. The patient voices understanding of current disease status and treatment options and is in agreement with the current care plan.  All questions were answered. The patient knows to call the clinic with any problems, questions or concerns. We can certainly see the patient much sooner if necessary.  The total time spent in the appointment was 30 minutes.  Disclaimer: This note was dictated with voice recognition software. Similar sounding words can inadvertently be transcribed and may not be corrected upon review.

## 2023-11-10 LAB — T4: T4, Total: 3.9 ug/dL — ABNORMAL LOW (ref 4.5–12.0)

## 2023-11-10 LAB — TSH: TSH: 4.442 u[IU]/mL (ref 0.350–4.500)

## 2023-11-13 ENCOUNTER — Telehealth: Payer: Self-pay | Admitting: Adult Health

## 2023-11-13 NOTE — Telephone Encounter (Signed)
 PT seeks a less expensive option to his Spiriva. Please run and adv PT or his son alternative. TY

## 2023-11-14 ENCOUNTER — Other Ambulatory Visit: Payer: Self-pay

## 2023-11-16 ENCOUNTER — Other Ambulatory Visit (HOSPITAL_COMMUNITY): Payer: Self-pay

## 2023-11-16 ENCOUNTER — Ambulatory Visit: Payer: Medicare Other | Admitting: Adult Health

## 2023-11-16 ENCOUNTER — Encounter: Payer: Self-pay | Admitting: Internal Medicine

## 2023-11-16 ENCOUNTER — Telehealth: Payer: Self-pay

## 2023-11-16 NOTE — Telephone Encounter (Signed)
 Patient currently has a deductible affecting these prices at this time:  [employer]- $316.57/30 days

## 2023-11-16 NOTE — Telephone Encounter (Signed)
 Test claims in separate encounter 03/31

## 2023-11-17 NOTE — Telephone Encounter (Signed)
Routing to Leslie

## 2023-11-17 NOTE — Telephone Encounter (Signed)
 Pedro Pope, CPhT     11/16/23  8:10 AM Note Patient currently has a deductible affecting these prices at this time:   [employer] Ell[employer]a- $316.57/30 days     I called and spoke with the pt's son and notified of response from pharm team He says that they can not afford the high copay to meet deductible, so they are asking for other recommendations Please advise, thanks!

## 2023-11-18 NOTE — Telephone Encounter (Signed)
 I do not know the isnurance and pricing. Can they call their insurance or find out OR what is the computer saying?

## 2023-11-23 ENCOUNTER — Encounter: Payer: Self-pay | Admitting: *Deleted

## 2023-11-23 DIAGNOSIS — I255 Ischemic cardiomyopathy: Secondary | ICD-10-CM | POA: Diagnosis not present

## 2023-11-23 DIAGNOSIS — I4891 Unspecified atrial fibrillation: Secondary | ICD-10-CM | POA: Diagnosis not present

## 2023-11-23 DIAGNOSIS — I2510 Atherosclerotic heart disease of native coronary artery without angina pectoris: Secondary | ICD-10-CM | POA: Diagnosis not present

## 2023-11-23 DIAGNOSIS — C3482 Malignant neoplasm of overlapping sites of left bronchus and lung: Secondary | ICD-10-CM | POA: Diagnosis not present

## 2023-11-23 DIAGNOSIS — I700 Atherosclerosis of aorta: Secondary | ICD-10-CM | POA: Diagnosis not present

## 2023-11-23 DIAGNOSIS — Z0000 Encounter for general adult medical examination without abnormal findings: Secondary | ICD-10-CM | POA: Diagnosis not present

## 2023-11-23 DIAGNOSIS — C3490 Malignant neoplasm of unspecified part of unspecified bronchus or lung: Secondary | ICD-10-CM | POA: Diagnosis not present

## 2023-11-23 DIAGNOSIS — Z1331 Encounter for screening for depression: Secondary | ICD-10-CM | POA: Diagnosis not present

## 2023-11-23 DIAGNOSIS — I272 Pulmonary hypertension, unspecified: Secondary | ICD-10-CM | POA: Diagnosis not present

## 2023-11-23 DIAGNOSIS — R0789 Other chest pain: Secondary | ICD-10-CM | POA: Diagnosis not present

## 2023-11-23 DIAGNOSIS — Z1389 Encounter for screening for other disorder: Secondary | ICD-10-CM | POA: Diagnosis not present

## 2023-11-23 DIAGNOSIS — I509 Heart failure, unspecified: Secondary | ICD-10-CM | POA: Diagnosis not present

## 2023-11-23 DIAGNOSIS — G4734 Idiopathic sleep related nonobstructive alveolar hypoventilation: Secondary | ICD-10-CM | POA: Diagnosis not present

## 2023-11-23 DIAGNOSIS — J84112 Idiopathic pulmonary fibrosis: Secondary | ICD-10-CM | POA: Diagnosis not present

## 2023-11-23 NOTE — Telephone Encounter (Signed)
 I sent msg via mychart letting him know needs to meet deductible as stated by pharm team Closing this encounter

## 2023-11-25 NOTE — Telephone Encounter (Signed)
 FYI from pharmacy team.

## 2023-11-28 ENCOUNTER — Other Ambulatory Visit: Payer: Self-pay

## 2023-11-30 ENCOUNTER — Encounter

## 2023-11-30 NOTE — Progress Notes (Signed)
 [facility] OFFICE PROGRESS NOTE  Margarete Sharps, MD [address] [place] [address]  DIAGNOSIS: Extensive stage (T3, N2, M1b) small cell lung cancer presented with obstructive left upper lobe lung mass in addition to mediastinal lymphadenopathy and malignant left pleural effusion as well as pleural-based metastasis as well as suspicious liver metastasis diagnosed in June 2023.   PRIOR THERAPY: palliative radiotherapy to enlarging gastrohepatic ligament lymph node under the care of Dr. Eloise Hake completed January 8th, 2025.   CURRENT THERAPY: Palliative systemic chemotherapy with carboplatin  initiated for AUC of 4 on day 1 and etoposide  80 Mg/M2 on days 1, 2 and 3 with Cosela  before the chemotherapy and Imfinzi  1500 Mg IV on day 1 every 3 weeks.  Status post 25 cycles.  Starting from cycle #5 the patient will be on maintenance treatment with Imfinzi  1500 Mg IV every 4 weeks.   INTERVAL HISTORY: Pedro Pope. 83 y.o. male returns to the clinic today for a follow-up visit. The patient was last seen by Dr. Marguerita Shih 4 weeks ago.     Otherwise he denies any major changes in his health since he was last seen.  He denies any fever, chills, or night sweats. His appetite is good. His weight is stable.  He may get mild dyspnea on exertion secondary to his emphysema which he states is stable. The patient sometimes has cough secondary to nasal congestion/mucus which is also unchanged. He denies any hemoptysis.  He sometimes gets a rash on his right forehead and right side of his neck from his immunotherapy which does not bother him much. He knows he can use steroid cream for itching if needed but he does not need to use this often.  He is here today for evaluation repeat blood work before undergoing cycle #26   MEDICAL HISTORY: Past Medical History:  Diagnosis Date   Anosmia    CAD (coronary artery disease), native coronary artery    a. 10/29/2013 antlat STEMI s/p DES to pLAD; residual  80 % proximal and 60% mid LCx diease   Chronic headaches    Dressler's syndrome (HCC)    a. placed on colchicine     ED (erectile dysfunction)    Elevated TSH    Fatty liver    Gout    Ischemic cardiomyopathy    a. 2D ECHO 11/04/14 w/ EF 40-45% with LV WMA, G2DD, mild MR, mild LA dilation, mod TR.   PAF (paroxysmal atrial fibrillation) (HCC)    a. Dx on 11/03/14 admission. Not placed on longterm AC due to DAPT w/ receent DES. Will plan for outpt heart monitor    Pre-diabetes    a. HgA1c 6.1 in 10/2014   Prostate cancer [facility])    s/p prostatectomy and XRT   Rheumatic fever 08/18/1945   Small cell lung cancer (HCC) 01/2022    ALLERGIES:  is allergic to penicillins, clindamycin, corn syrup [glucose], and aspirin  adult low [aspirin ].  MEDICATIONS:  Current Outpatient Medications  Medication Sig Dispense Refill   acetaminophen  (TYLENOL ) 500 MG tablet Take 500 mg by mouth every 6 (six) hours as needed for mild pain or moderate pain.     albuterol  (VENTOLIN  HFA) 108 (90 Base) MCG/ACT inhaler Inhale 1-2 puffs into the lungs every 6 (six) hours as needed. 8 g 2   ascorbic acid  (VITAMIN C ) 500 MG tablet Take 500 mg by mouth 2 (two) times daily.     Blood Pressure Monitor KIT For daily blood pressure monitoring;  Arm Cuff 1  each 0   carvedilol  (COREG ) 3.125 MG tablet TAKE 1 TABLET(3.125 MG) BY MOUTH TWICE DAILY WITH A MEAL (Patient taking differently: once.) 180 tablet 3   escitalopram (LEXAPRO) 5 MG tablet Take 5 mg by mouth daily.     hydrocortisone  1 % lotion Apply 1 Application topically 2 (two) times daily. 118 mL 0   hydrOXYzine  (ATARAX ) 10 MG tablet Take 1 tablet (10 mg total) by mouth 3 (three) times daily as needed. 30 tablet 0   PROCTO-MED HC  2.5 % rectal cream Apply topically.     Tiotropium Bromide Monohydrate  (SPIRIVA  RESPIMAT) 1.25 MCG/ACT AERS Inhale 2 puffs into the lungs daily. 1 each 3   Tiotropium Bromide Monohydrate  (SPIRIVA  RESPIMAT) 1.25 MCG/ACT AERS Inhale 2 puffs into the  lungs daily.     triamcinolone  cream (KENALOG ) 0.1 % Apply 1 Application topically 2 (two) times daily. 80 g 0   No current facility-administered medications for this visit.   Facility-Administered Medications Ordered in Other Visits  Medication Dose Route Frequency Provider Last Rate Last Admin   sodium chloride  flush (NS) 0.9 % injection 10 mL  10 mL Intracatheter Once Marlene Simas, MD        SURGICAL HISTORY:  Past Surgical History:  Procedure Laterality Date   CORONARY ANGIOPLASTY WITH STENT PLACEMENT  10/30/14   STEMI s/p DES to proximal LAD; residual 80 % proximal and 60% mid LCx diease   IR IMAGING GUIDED PORT INSERTION  03/12/2022   IR PATIENT EVAL TECH 0-60 MINS  10/20/2022   IR PATIENT EVAL TECH 0-60 MINS  10/27/2022   IR PATIENT EVAL TECH 0-60 MINS  10/30/2022   IR PATIENT EVAL TECH 0-60 MINS  10/23/2022   IR PATIENT EVAL TECH 0-60 MINS  11/06/2022   IR PATIENT EVAL TECH 0-60 MINS  11/13/2022   IR PATIENT EVAL TECH 0-60 MINS  12/01/2022   IR RADIOLOGIST EVAL & MGMT  10/02/2022   IR RADIOLOGIST EVAL & MGMT  10/13/2022   IR RADIOLOGIST EVAL & MGMT  10/14/2022   IR RADIOLOGIST EVAL & MGMT  10/17/2022   IR RADIOLOGIST EVAL & MGMT  10/20/2022   IR REMOVAL TUN ACCESS W/ PORT W/O FL MOD SED  10/13/2022   LEFT HEART CATHETERIZATION WITH CORONARY ANGIOGRAM N/A 10/30/2014   Procedure: LEFT HEART CATHETERIZATION WITH CORONARY ANGIOGRAM;  Surgeon: Avanell Leigh, MD;  Location[facility] C[facility] LAB;  Service: Cardiovascular;  Laterality: N/A;   PERCUTANEOUS CORONARY STENT INTERVENTION (PCI-S)  10/30/2014   Procedure: PERCUTANEOUS CORONARY STENT INTERVENTION (PCI-S);  Surgeon: Avanell Leigh, MD;  [facility] C[facility] LAB;  Service: Cardiovascular;;   PROSTATECTOMY     THORACENTESIS Left 12/31/2021   Procedure: THORACENTESIS;  Surgeon: Josiah Nigh, MD;  Location: [facility] ENDOSCOPY;  Service: Pulmonary;  Laterality: Left;   TONSILLECTOMY     VIDEO ASSISTED THORACOSCOPY (VATS)/DECORTICATION Left 01/21/2022    Procedure: VIDEO ASSISTED THORACOSCOPY (VATS)/DECORTICATION;  Surgeon: Hilarie Lovely, MD;  Location: [facility] OR;  Service: Thoracic;  Laterality: Left;    REVIEW OF SYSTEMS:   Review of Systems  Constitutional: Positive for stable fatigue. Negative for appetite change, chills,  fever and unexpected weight change.  HENT: Negative for mouth sores, nosebleeds, sore throat and trouble swallowing.   Eyes: Negative for eye problems and icterus.  Respiratory: Positive for mild dyspnea on exertion. Negative for cough, hemoptysis, and wheezing.    Gastrointestinal: Positive for intermittent right lateral abdominal discomfort (none at this time). Negative for constipation, diarrhea, nausea and vomiting.  Genitourinary: Negative for bladder incontinence, difficulty urinating, dysuria, frequency and hematuria.   Musculoskeletal: Negative for back pain, gait problem, neck pain and neck stiffness.  Skin: Positive for improved rash on right forehead and right neck.  Neurological: Negative for dizziness, extremity weakness, gait problem, headaches, light-headedness and seizures.  Hematological: Negative for adenopathy. Does not bruise/bleed easily.  Psychiatric/Behavioral:  Negative for confusion, depression and sleep disturbance. The patient is not nervous/anxious.     PHYSICAL EXAMINATION:  There were no vitals taken for this visit.  ECOG PERFORMANCE STATUS: 1  Physical Exam  Constitutional: Oriented to person, place, and time and well-developed, well-nourished, and in no distress.  HENT:  Head: Normocephalic and atraumatic.  Mouth/Throat: Oropharynx is clear and moist. No oropharyngeal exudate.  Eyes: Conjunctivae are normal. Right eye exhibits no discharge. Left eye exhibits no discharge. No scleral icterus.  Neck: Normal range of motion. Neck supple.  Cardiovascular: Normal rate, regular rhythm, normal heart sounds and intact distal pulses.   Pulmonary/Chest: Effort normal and breath sounds  normal. No respiratory distress. No wheezes. No rales.  Abdominal: Soft. Bowel sounds are normal. Exhibits no distension and no mass. There is no tenderness.  Musculoskeletal: Normal range of motion. Exhibits no edema. No CVA tenderness.  Lymphadenopathy:    No cervical adenopathy.  Neurological: Alert and oriented to person, place, and time. Exhibits normal muscle tone. Gait normal. Coordination normal.  Skin: Skin is warm and dry. No rash noted. Not diaphoretic. No erythema. No pallor.  Psychiatric: Mood, memory and judgment normal.  Vitals reviewed.  LABORATORY DATA: Lab Results  Component Value Date   WBC 7.2 11/09/2023   HGB 12.6 (L) 11/09/2023   HCT 38.5 (L) 11/09/2023   MCV 93.7 11/09/2023   PLT 179 11/09/2023      Chemistry      Component Value Date/Time   NA 143 11/09/2023 1053   K 3.9 11/09/2023 1053   CL 110 11/09/2023 1053   CO2 29 11/09/2023 1053   BUN 24 (H) 11/09/2023 1053   CREATININE 0.91 11/09/2023 1053      Component Value Date/Time   CALCIUM  9.0 11/09/2023 1053   ALKPHOS 59 11/09/2023 1053   AST 20 11/09/2023 1053   ALT 14 11/09/2023 1053   BILITOT 0.4 11/09/2023 1053       RADIOGRAPHIC STUDIES:  No results found.   ASSESSMENT/PLAN:  This is a very pleasant 83 year old Caucasian male diagnosed with extensive stage (T3, N2, M1 a) small cell lung cancer.  He presented with an obstructive left upper lobe lung mass in addition to mediastinal lymphadenopathy.  He also has a malignant left pleural effusion and pleural-based metastases.  He also has a suspicious liver metastasis.  He was diagnosed in June 2023.    The patient is currently undergoing slightly reduced dose systemic chemotherapy with carboplatin  for an AUC of 4 on day 1, etoposide  80 mg per metered squared on days 1 2, and 3 with Imfinzi  on day 1 every 3 weeks.  He is status post 25 cycles.  Starting from cycle #5, the patient started maintenance immunotherapy with Imfinzi  IV every 4 weeks.    Labs were reviewed. Recommend he proceed with cycle #26 today as scheduled.    See him back for follow-up visit in 4 weeks for evaluation repeat blood work before undergoing cycle #27.  Rash on forehead Chronic rash stable. Advised on steroid cream use for symptom management. - Use steroid cream as needed for itching or bothersome symptoms.  The  patient was advised to call immediately if he has any concerning symptoms in the interval. The patient voices understanding of current disease status and treatment options and is in agreement with the current care plan. All questions were answered. The patient knows to call the clinic with any problems, questions or concerns. We can certainly see the patient much sooner if necessary   No orders of the defined types were placed in this encounter.    The total time spent in the appointment was 20-29 minutes  Gresham Caetano L Mertha Clyatt, PA-C 11/30/23

## 2023-12-02 NOTE — Telephone Encounter (Signed)
 Pharm team, can you please verify that his insurance covers inhalers? I believe the issue he is having is related to high deductible.

## 2023-12-03 NOTE — Telephone Encounter (Signed)
 Patients insurance does cover inhalers- there is a deductible that must be met before any prices will go down. Patient may contact plan for more information.

## 2023-12-07 ENCOUNTER — Inpatient Hospital Stay: Payer: Medicare Other | Attending: Internal Medicine | Admitting: Physician Assistant

## 2023-12-07 ENCOUNTER — Inpatient Hospital Stay: Payer: Medicare Other

## 2023-12-07 VITALS — BP 118/58 | HR 55 | Temp 97.6°F | Resp 16

## 2023-12-07 VITALS — BP 106/68 | HR 59 | Temp 98.5°F | Resp 16 | Wt 147.1 lb

## 2023-12-07 DIAGNOSIS — J910 Malignant pleural effusion: Secondary | ICD-10-CM | POA: Insufficient documentation

## 2023-12-07 DIAGNOSIS — C3412 Malignant neoplasm of upper lobe, left bronchus or lung: Secondary | ICD-10-CM | POA: Insufficient documentation

## 2023-12-07 DIAGNOSIS — R21 Rash and other nonspecific skin eruption: Secondary | ICD-10-CM | POA: Diagnosis not present

## 2023-12-07 DIAGNOSIS — Z5112 Encounter for antineoplastic immunotherapy: Secondary | ICD-10-CM | POA: Diagnosis not present

## 2023-12-07 DIAGNOSIS — C3482 Malignant neoplasm of overlapping sites of left bronchus and lung: Secondary | ICD-10-CM

## 2023-12-07 LAB — CMP (CANCER CENTER ONLY)
ALT: 15 U/L (ref 0–44)
AST: 21 U/L (ref 15–41)
Albumin: 3.9 g/dL (ref 3.5–5.0)
Alkaline Phosphatase: 69 U/L (ref 38–126)
Anion gap: 5 (ref 5–15)
BUN: 25 mg/dL — ABNORMAL HIGH (ref 8–23)
CO2: 28 mmol/L (ref 22–32)
Calcium: 9.2 mg/dL (ref 8.9–10.3)
Chloride: 107 mmol/L (ref 98–111)
Creatinine: 0.99 mg/dL (ref 0.61–1.24)
GFR, Estimated: >60 mL/min (ref >60)
Glucose, Bld: 176 mg/dL — ABNORMAL HIGH (ref 70–99)
Potassium: 4.2 mmol/L (ref 3.5–5.1)
Sodium: 140 mmol/L (ref 135–145)
Total Bilirubin: 0.5 mg/dL (ref 0.0–1.2)
Total Protein: 6.5 g/dL (ref 6.5–8.1)

## 2023-12-07 LAB — CBC WITH DIFFERENTIAL (CANCER CENTER ONLY)
Abs Immature Granulocytes: 0.02 10*3/uL (ref 0.00–0.07)
Basophils Absolute: 0 10*3/uL (ref 0.0–0.1)
Basophils Relative: 0 %
Eosinophils Absolute: 0.5 10*3/uL (ref 0.0–0.5)
Eosinophils Relative: 7 %
HCT: 39.7 % (ref 39.0–52.0)
Hemoglobin: 13.1 g/dL (ref 13.0–17.0)
Immature Granulocytes: 0 %
Lymphocytes Relative: 18 %
Lymphs Abs: 1.4 10*3/uL (ref 0.7–4.0)
MCH: 29.9 pg (ref 26.0–34.0)
MCHC: 33 g/dL (ref 30.0–36.0)
MCV: 90.6 fL (ref 80.0–100.0)
Monocytes Absolute: 0.7 10*3/uL (ref 0.1–1.0)
Monocytes Relative: 9 %
Neutro Abs: 5 10*3/uL (ref 1.7–7.7)
Neutrophils Relative %: 66 %
Platelet Count: 165 10*3/uL (ref 150–400)
RBC: 4.38 MIL/uL (ref 4.22–5.81)
RDW: 13.2 % (ref 11.5–15.5)
WBC Count: 7.7 10*3/uL (ref 4.0–10.5)
nRBC: 0 % (ref 0.0–0.2)

## 2023-12-07 MED ORDER — SODIUM CHLORIDE 0.9 % IV SOLN
Freq: Once | INTRAVENOUS | Status: AC
Start: 2023-12-07 — End: 2023-12-07

## 2023-12-07 MED ORDER — SODIUM CHLORIDE 0.9 % IV SOLN
1500.0000 mg | Freq: Once | INTRAVENOUS | Status: AC
  Administered 2023-12-07: 1500 mg via INTRAVENOUS
  Filled 2023-12-07: qty 30

## 2023-12-07 NOTE — Patient Instructions (Signed)
 [facility] - A DEPT OF [facility]  Discharge Instructions: Thank you for choosing [facility] to provide your oncology and hematology care.   If you have a lab appointment with the Cancer Center, please go directly to the Cancer Center and check in at the registration area.   Wear comfortable clothing and clothing appropriate for easy access to any Portacath or PICC line.   We strive to give you quality time with your provider. You may need to reschedule your appointment if you arrive late (15 or more minutes).  Arriving late affects you and other patients whose appointments are after yours.  Also, if you miss three or more appointments without notifying the office, you may be dismissed from the clinic at the provider's discretion.      For prescription refill requests, have your pharmacy contact our office and allow 72 hours for refills to be completed.    Today you received the following chemotherapy and/or immunotherapy agent: Pemetrexed (Alimta).      To help prevent nausea and vomiting after your treatment, we encourage you to take your nausea medication as directed.  BELOW ARE SYMPTOMS THAT SHOULD BE REPORTED IMMEDIATELY: *FEVER GREATER THAN 100.4 F (38 C) OR HIGHER *CHILLS OR SWEATING *NAUSEA AND VOMITING THAT IS NOT CONTROLLED WITH YOUR NAUSEA MEDICATION *UNUSUAL SHORTNESS OF BREATH *UNUSUAL BRUISING OR BLEEDING *URINARY PROBLEMS (pain or burning when urinating, or frequent urination) *BOWEL PROBLEMS (unusual diarrhea, constipation, pain near the anus) TENDERNESS IN MOUTH AND THROAT WITH OR WITHOUT PRESENCE OF ULCERS (sore throat, sores in mouth, or a toothache) UNUSUAL RASH, SWELLING OR PAIN  UNUSUAL VAGINAL DISCHARGE OR ITCHING   Items with * indicate a potential emergency and should be followed up as soon as possible or go to the Emergency Department if any problems should occur.  Please show the CHEMOTHERAPY ALERT CARD or  IMMUNOTHERAPY ALERT CARD at check-in to the Emergency Department and triage nurse.  Should you have questions after your visit or need to cancel or reschedule your appointment, please contact [facility] - A DEPT OF [facility]  Dept: [phone]  and follow the prompts.  Office hours are 8:00 a.m. to 4:30 p.m. Monday - Friday. Please note that voicemails left after 4:00 p.m. may not be returned until the following business day.  We are closed weekends and major holidays. You have access to a nurse at all times for urgent questions. Please call the main number to the clinic Dept: [phone] and follow the prompts.   For any non-urgent questions, you may also contact your provider using MyChart. We now offer e-Visits for anyone 32 and older to request care online for non-urgent symptoms. For details visit [email or web address].   Also download the MyChart app! Go to the app store, search "MyChart", open the app, select [facility], and log in with your MyChart username and password.

## 2023-12-08 ENCOUNTER — Other Ambulatory Visit: Payer: Self-pay

## 2023-12-22 ENCOUNTER — Ambulatory Visit (HOSPITAL_BASED_OUTPATIENT_CLINIC_OR_DEPARTMENT_OTHER): Admitting: Internal Medicine

## 2023-12-22 DIAGNOSIS — G4734 Idiopathic sleep related nonobstructive alveolar hypoventilation: Secondary | ICD-10-CM

## 2023-12-22 DIAGNOSIS — R0902 Hypoxemia: Secondary | ICD-10-CM

## 2023-12-22 DIAGNOSIS — J8410 Pulmonary fibrosis, unspecified: Secondary | ICD-10-CM

## 2023-12-22 DIAGNOSIS — J439 Emphysema, unspecified: Secondary | ICD-10-CM

## 2023-12-22 DIAGNOSIS — J84112 Idiopathic pulmonary fibrosis: Secondary | ICD-10-CM | POA: Diagnosis not present

## 2023-12-22 LAB — PULMONARY FUNCTION TEST
DL/VA % pred: 61 %
DL/VA: 2.4 ml/min/mmHg/L
DLCO cor % pred: 48 %
DLCO cor: 11.23 ml/min/mmHg
DLCO unc % pred: 45 %
DLCO unc: 10.73 ml/min/mmHg
FEF 25-75 Pre: 1.57 L/s
FEF2575-%Pred-Pre: 88 %
FEV1-%Pred-Pre: 86 %
FEV1-Pre: 2.3 L
FEV1FVC-%Pred-Pre: 93 %
FEV6-%Pred-Pre: 98 %
FEV6-Pre: 3.45 L
FEV6FVC-%Pred-Pre: 107 %
FVC-%Pred-Pre: 91 %
FVC-Pre: 3.45 L
Pre FEV1/FVC ratio: 67 %
Pre FEV6/FVC Ratio: 100 %

## 2023-12-22 NOTE — Patient Instructions (Signed)
Spiro and DLCO performed today. 

## 2023-12-22 NOTE — Progress Notes (Signed)
Spiro and DLCO performed today. 

## 2023-12-23 NOTE — Telephone Encounter (Signed)
 Okay any other cheaper options?

## 2023-12-24 NOTE — Telephone Encounter (Signed)
 Not at this time, patient has a deductible to meet before any of these prices go down.

## 2023-12-27 ENCOUNTER — Other Ambulatory Visit: Payer: Self-pay

## 2023-12-28 ENCOUNTER — Ambulatory Visit: Attending: Cardiovascular Disease | Admitting: Cardiovascular Disease

## 2023-12-28 ENCOUNTER — Encounter: Payer: Self-pay | Admitting: Cardiovascular Disease

## 2023-12-28 VITALS — BP 122/60 | HR 56 | Ht 69.0 in | Wt 147.0 lb

## 2023-12-28 DIAGNOSIS — E782 Mixed hyperlipidemia: Secondary | ICD-10-CM | POA: Insufficient documentation

## 2023-12-28 DIAGNOSIS — I2109 ST elevation (STEMI) myocardial infarction involving other coronary artery of anterior wall: Secondary | ICD-10-CM | POA: Insufficient documentation

## 2023-12-28 DIAGNOSIS — I480 Paroxysmal atrial fibrillation: Secondary | ICD-10-CM | POA: Diagnosis present

## 2023-12-28 DIAGNOSIS — I255 Ischemic cardiomyopathy: Secondary | ICD-10-CM | POA: Insufficient documentation

## 2023-12-28 NOTE — Assessment & Plan Note (Signed)
 History of hyperlipidemia on atorvastatin  in the past which was discontinued in 2023.  When he was on atorvastatin  his lipid profile was excellent with LDL as low as 39.  His most recent lipid profile performed 11/06/2022 revealed total cholesterol 172, LDL 108 and HDL of 49.

## 2023-12-28 NOTE — Assessment & Plan Note (Signed)
 History of ischemic cardiomyopathy with a EF of 45 to 50% on low-dose carvedilol .  This has remained stable.

## 2023-12-28 NOTE — Patient Instructions (Signed)

## 2023-12-28 NOTE — Progress Notes (Unsigned)
 12/28/2023 Pedro Pope.   July 29, 1941  [id number]  Primary Physician Pedro Sharps, MD Primary Cardiologist: Pedro Leigh MD FACP, [place], [place], [place]  HPI:  Pedro Pope. is a 83 y.o.    thin appearing married Caucasian male father of 3, grandfather 7 grandchildren and great grandfather of 8 great grandchildren who I last saw in the office 06/29/2023.Pedro Pope  is a patient of Dr. Autry Legions Pope's. 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 Pope basically had no risk factors prior to his presentation. Pope had an acute anterolateral infarction.  Pope had intervention by myself 10/30/14 with a drug-eluting stent in his proximal LAD. Pope had a left dominant system and had moderate proximal AV groove circumflex disease with high-grade nondominant RCA disease. Ejection fraction was in the 40-45% range with moderate anteroapical hypokinesia. Pope was discharged home 2 days after admission and was admitted several days later with A. Fib with RVR which she was symptomatic. His EKG showed changes and Pope has converted to sinus rhythm. Consideration was given to oral anticoagulation over the risks of bleeding were thought to outweigh the benefits. Pope is wearing an event monitor and has noticed several episodes of brief PAF. His beta blocker was discontinued because of bradycardia and hypotension. Recent Myoview showed dense scar in the LAD territory and 2-D echo revealed an EF of 30-35%. Pope participated  in cardiac rehabilitation and is relatively asymptomatic. His most recent 2-D echo performed 04/19/15 showed improvement in his LV function up to the 40-45% range. I did send him to see Dr. Nunzio Pope for consideration of ICD therapy and to discuss his A. Fib. Pope was ultimately placed on Xarelto  oral anti-coagulation and his Brilenta was transitioned to Plavix  to mitigate his bleeding risk.   Pope unfortunately has developed lung cancer stage IV.  Pope has had chemotherapy and immunotherapy.  Pope  does have a Port-A-Cath in place.  Pope has had a Pleurx tube placed as well for pleural effusion which was managed by Dr. Deloise Pope.  Pope has gained the weight back to the last a year ago.   Since I saw him in the office 6 months ago Pope is remained stable.  Pope does get immunotherapy on a monthly basis for his stage IV lung cancer.  His most recent echo performed 08/04/2023 revealed stable EF in the 45 to 50% range.  Pope denies chest pain but has chronic shortness of breath because of his lung cancer and COPD.  Pope no longer is on a statin drug as of 2 years ago.   Current Meds  Medication Sig   acetaminophen  (TYLENOL ) 500 MG tablet Take 500 mg by mouth every 6 (six) hours as needed for mild pain or moderate pain.   albuterol  (VENTOLIN  HFA) 108 (90 Base) MCG/ACT inhaler Inhale 1-2 puffs into the lungs every 6 (six) hours as needed.   ascorbic acid  (VITAMIN C ) 500 MG tablet Take 500 mg by mouth 2 (two) times daily.   Blood Pressure Monitor KIT For daily blood pressure monitoring;  Arm Cuff   carvedilol  (COREG ) 3.125 MG tablet TAKE 1 TABLET(3.125 MG) BY MOUTH TWICE DAILY WITH A MEAL (Patient taking differently: once.)   escitalopram (LEXAPRO) 5 MG tablet Take 5 mg by mouth daily.   hydrocortisone  1 % lotion Apply 1 Application topically 2 (two) times daily.   hydrOXYzine  (ATARAX ) 10 MG tablet Take 1 tablet (10 mg total) by mouth 3 (three) times daily as  needed.   PROCTO-MED HC  2.5 % rectal cream Apply topically.   triamcinolone  cream (KENALOG ) 0.1 % Apply 1 Application topically 2 (two) times daily.     Allergies  Allergen Reactions   Penicillins Anaphylaxis   Clindamycin Other (See Comments)   Corn Syrup [Glucose] Other (See Comments)    High fructose corn syrup causes gout   Aspirin  Adult Low [Aspirin ] Itching and Rash    Dr. Mamie Pope suspects that long term caused a rash, short term use appears to be ok    Social History   Socioeconomic History   Marital status: Married    Spouse name: Not  on file   Number of children: Not on file   Years of education: Not on file   Highest education level: Not on file  Occupational History   Not on file  Tobacco Use   Smoking status: Former    Current packs/day: 0.00    Types: Cigarettes    Quit date: 08/18/1992    Years since quitting: 31.3   Smokeless tobacco: Never  Vaping Use   Vaping status: Not on file  Substance and Sexual Activity   Alcohol  use: Not Currently    Comment: social   Drug use: No   Sexual activity: Not Currently  Other Topics Concern   Not on file  Social History Narrative   Pt lives in [place] with spouse.  Realtor.  Retired Hydrographic surveyor   Social Drivers of Corporate investment banker Strain: Not on [employer] Insecurity: No Food Insecurity (07/27/2023)   Hunger Vital Sign    Worried About Running Out of Food in the Last Year: Never true    Ran Out of Food in the Last Year: Never true  Transportation Needs: No Transportation Needs (07/27/2023)   PRAPARE - Administrator, Civil Service (Medical): No    Lack of Transportation (Non-Medical): No  Physical Activity: Not on file  Stress: Not on file  Social Connections: Not on file  Intimate Partner Violence: Not At Risk (07/27/2023)   Humiliation, Afraid, Rape, and Kick questionnaire    Fear of Current or Ex-Partner: No    Emotionally Abused: No    Physically Abused: No    Sexually Abused: No     Review of Systems: General: negative for chills, fever, night sweats or weight changes.  Cardiovascular: negative for chest pain, dyspnea on exertion, edema, orthopnea, palpitations, paroxysmal nocturnal dyspnea or shortness of breath Dermatological: negative for rash Respiratory: negative for cough or wheezing Urologic: negative for hematuria Abdominal: negative for nausea, vomiting, diarrhea, bright red blood per rectum, melena, or hematemesis Neurologic: negative for visual changes, syncope, or dizziness All other systems reviewed and are  otherwise negative except as noted above.    Blood pressure 122/60, pulse (!) 56, height 5\' 9"  (1.753 m), weight 147 lb (66.7 kg), SpO2 94%.  General appearance: alert and no distress Neck: no adenopathy, no carotid bruit, no JVD, supple, symmetrical, trachea midline, and thyroid  not enlarged, symmetric, no tenderness/mass/nodules Lungs: clear to auscultation bilaterally Heart: regular rate and rhythm, S1, S2 normal, no murmur, click, rub or gallop Extremities: extremities normal, atraumatic, no cyanosis or edema Pulses: 2+ and symmetric Skin: Skin color, texture, turgor normal. No rashes or lesions Neurologic: Grossly normal  EKG EKG Interpretation Date/Time:  Monday Dec 28 2023 15:17:07 EDT Ventricular Rate:  56 PR Interval:  220 QRS Duration:  92 QT Interval:  482 QTC Calculation: 465 R Axis:   46  Text  Interpretation: Sinus bradycardia with 1st degree A-V block Possible Inferior infarct (cited on or before 19-Jan-2022) Anterior infarct (cited on or before 19-Jan-2022) When compared with ECG of 29-Jun-2023 15:17, No significant change was found Confirmed by Lauro Portal [phone]) on 12/28/2023 3:35:25 PM    ASSESSMENT AND PLAN:   ST elevation myocardial infarction (STEMI) involving other coronary artery of anterior wall - STEMI s/p DES to proximal LAD; residual 80 % proximal and 60% mid LCx diease History of CAD status post anterior STEMI 10/30/2014.  I intervened and placed a stent in his proximal LAD.  Pope did have a left dominant system with moderate proximal AV groove circumflex disease and high-grade nondominant RCA disease.  These were treated medically.  Pope did have moderate LV dysfunction which has been stable.  Pope had a Myoview in the past that showed scar in the LAD territory without ischemia.  Pope denies chest pain but is chronically short of breath because of lung cancer and COPD.  Hyperlipidemia History of hyperlipidemia on atorvastatin  in the past which was discontinued  in 2023.  When Pope was on atorvastatin  his lipid profile was excellent with LDL as low as 39.  His most recent lipid profile performed 11/06/2022 revealed total cholesterol 172, LDL 108 and HDL of 49.  Ischemic cardiomyopathy History of ischemic cardiomyopathy with a EF of 45 to 50% on low-dose carvedilol .  This has remained stable.     Pedro Leigh MD FACP,FACC,FAHA, [facility] 12/28/2023 3:47 PM

## 2023-12-28 NOTE — Assessment & Plan Note (Signed)
 History of CAD status post anterior STEMI 10/30/2014.  I intervened and placed a stent in his proximal LAD.  He did have a left dominant system with moderate proximal AV groove circumflex disease and high-grade nondominant RCA disease.  These were treated medically.  He did have moderate LV dysfunction which has been stable.  He had a Myoview in the past that showed scar in the LAD territory without ischemia.  He denies chest pain but is chronically short of breath because of lung cancer and COPD.

## 2024-01-04 ENCOUNTER — Inpatient Hospital Stay: Payer: Medicare Other

## 2024-01-04 ENCOUNTER — Inpatient Hospital Stay: Payer: Medicare Other | Attending: Internal Medicine

## 2024-01-04 ENCOUNTER — Inpatient Hospital Stay (HOSPITAL_BASED_OUTPATIENT_CLINIC_OR_DEPARTMENT_OTHER): Payer: Medicare Other | Admitting: Internal Medicine

## 2024-01-04 VITALS — BP 126/88 | HR 57 | Temp 97.6°F | Resp 18

## 2024-01-04 VITALS — BP 117/57 | HR 58 | Temp 98.0°F | Resp 15 | Ht 69.0 in | Wt 149.5 lb

## 2024-01-04 DIAGNOSIS — C3490 Malignant neoplasm of unspecified part of unspecified bronchus or lung: Secondary | ICD-10-CM | POA: Diagnosis not present

## 2024-01-04 DIAGNOSIS — C3482 Malignant neoplasm of overlapping sites of left bronchus and lung: Secondary | ICD-10-CM

## 2024-01-04 DIAGNOSIS — J439 Emphysema, unspecified: Secondary | ICD-10-CM | POA: Insufficient documentation

## 2024-01-04 DIAGNOSIS — C3412 Malignant neoplasm of upper lobe, left bronchus or lung: Secondary | ICD-10-CM | POA: Insufficient documentation

## 2024-01-04 DIAGNOSIS — J910 Malignant pleural effusion: Secondary | ICD-10-CM | POA: Diagnosis not present

## 2024-01-04 DIAGNOSIS — C782 Secondary malignant neoplasm of pleura: Secondary | ICD-10-CM | POA: Insufficient documentation

## 2024-01-04 DIAGNOSIS — Z5112 Encounter for antineoplastic immunotherapy: Secondary | ICD-10-CM | POA: Diagnosis not present

## 2024-01-04 LAB — CMP (CANCER CENTER ONLY)
ALT: 10 U/L (ref 0–44)
AST: 20 U/L (ref 15–41)
Albumin: 3.9 g/dL (ref 3.5–5.0)
Alkaline Phosphatase: 61 U/L (ref 38–126)
Anion gap: 7 (ref 5–15)
BUN: 23 mg/dL (ref 8–23)
CO2: 26 mmol/L (ref 22–32)
Calcium: 9 mg/dL (ref 8.9–10.3)
Chloride: 108 mmol/L (ref 98–111)
Creatinine: 0.97 mg/dL (ref 0.61–1.24)
GFR, Estimated: >60 mL/min (ref >60)
Glucose, Bld: 107 mg/dL — ABNORMAL HIGH (ref 70–99)
Potassium: 4.2 mmol/L (ref 3.5–5.1)
Sodium: 141 mmol/L (ref 135–145)
Total Bilirubin: 0.3 mg/dL (ref 0.0–1.2)
Total Protein: 6.4 g/dL — ABNORMAL LOW (ref 6.5–8.1)

## 2024-01-04 LAB — CBC WITH DIFFERENTIAL (CANCER CENTER ONLY)
Abs Immature Granulocytes: 0.03 10*3/uL (ref 0.00–0.07)
Basophils Absolute: 0 10*3/uL (ref 0.0–0.1)
Basophils Relative: 0 %
Eosinophils Absolute: 0.5 10*3/uL (ref 0.0–0.5)
Eosinophils Relative: 6 %
HCT: 39.5 % (ref 39.0–52.0)
Hemoglobin: 13.2 g/dL (ref 13.0–17.0)
Immature Granulocytes: 0 %
Lymphocytes Relative: 18 %
Lymphs Abs: 1.4 10*3/uL (ref 0.7–4.0)
MCH: 30.2 pg (ref 26.0–34.0)
MCHC: 33.4 g/dL (ref 30.0–36.0)
MCV: 90.4 fL (ref 80.0–100.0)
Monocytes Absolute: 0.7 10*3/uL (ref 0.1–1.0)
Monocytes Relative: 9 %
Neutro Abs: 5.2 10*3/uL (ref 1.7–7.7)
Neutrophils Relative %: 67 %
Platelet Count: 151 10*3/uL (ref 150–400)
RBC: 4.37 MIL/uL (ref 4.22–5.81)
RDW: 13 % (ref 11.5–15.5)
WBC Count: 7.8 10*3/uL (ref 4.0–10.5)
nRBC: 0 % (ref 0.0–0.2)

## 2024-01-04 MED ORDER — SODIUM CHLORIDE 0.9 % IV SOLN: Freq: Once | INTRAVENOUS | Status: AC

## 2024-01-04 MED ORDER — DURVALUMAB 500 MG/10ML IV SOLN
1500.0000 mg | Freq: Once | INTRAVENOUS | Status: AC
  Administered 2024-01-04: 1500 mg via INTRAVENOUS
  Filled 2024-01-04: qty 30

## 2024-01-04 NOTE — Patient Instructions (Signed)
 [facility] - A DEPT OF [facility]  Discharge Instructions: Thank you for choosing [facility] to provide your oncology and hematology care.   If you have a lab appointment with the Cancer Center, please go directly to the Cancer Center and check in at the registration area.   Wear comfortable clothing and clothing appropriate for easy access to any Portacath or PICC line.   We strive to give you quality time with your provider. You may need to reschedule your appointment if you arrive late (15 or more minutes).  Arriving late affects you and other patients whose appointments are after yours.  Also, if you miss three or more appointments without notifying the office, you may be dismissed from the clinic at the provider's discretion.      For prescription refill requests, have your pharmacy contact our office and allow 72 hours for refills to be completed.    Today you received the following chemotherapy and/or immunotherapy agents: imfinzi   To help prevent nausea and vomiting after your treatment, we encourage you to take your nausea medication as directed.  BELOW ARE SYMPTOMS THAT SHOULD BE REPORTED IMMEDIATELY: *FEVER GREATER THAN 100.4 F (38 C) OR HIGHER *CHILLS OR SWEATING *NAUSEA AND VOMITING THAT IS NOT CONTROLLED WITH YOUR NAUSEA MEDICATION *UNUSUAL SHORTNESS OF BREATH *UNUSUAL BRUISING OR BLEEDING *URINARY PROBLEMS (pain or burning when urinating, or frequent urination) *BOWEL PROBLEMS (unusual diarrhea, constipation, pain near the anus) TENDERNESS IN MOUTH AND THROAT WITH OR WITHOUT PRESENCE OF ULCERS (sore throat, sores in mouth, or a toothache) UNUSUAL RASH, SWELLING OR PAIN  UNUSUAL VAGINAL DISCHARGE OR ITCHING   Items with * indicate a potential emergency and should be followed up as soon as possible or go to the Emergency Department if any problems should occur.  Please show the CHEMOTHERAPY ALERT CARD or IMMUNOTHERAPY ALERT  CARD at check-in to the Emergency Department and triage nurse.  Should you have questions after your visit or need to cancel or reschedule your appointment, please contact [facility] - A DEPT OF [facility]  Dept: [phone]  and follow the prompts.  Office hours are 8:00 a.m. to 4:30 p.m. Monday - Friday. Please note that voicemails left after 4:00 p.m. may not be returned until the following business day.  We are closed weekends and major holidays. You have access to a nurse at all times for urgent questions. Please call the main number to the clinic Dept: [phone] and follow the prompts.   For any non-urgent questions, you may also contact your provider using MyChart. We now offer e-Visits for anyone 65 and older to request care online for non-urgent symptoms. For details visit [email or web address].   Also download the MyChart app! Go to the app store, search "MyChart", open the app, select [facility], and log in with your MyChart username and password.

## 2024-01-04 NOTE — Progress Notes (Signed)
 [facility] [phone]   [phone]  OFFICE PROGRESS NOTE  Margarete Sharps, MD [address] [place] [address]  DIAGNOSIS: Extensive stage (T3, N2, M1b) small cell lung cancer presented with obstructive left upper lobe lung mass in addition to mediastinal lymphadenopathy and malignant left pleural effusion as well as pleural-based metastasis as well as suspicious liver metastasis diagnosed in June 2023.  PRIOR THERAPY: None  CURRENT THERAPY:  1) Palliative systemic chemotherapy with carboplatin  initiated for AUC of 4 on day 1 and etoposide  80 Mg/M2 on days 1, 2 and 3 with Cosela  before the chemotherapy and Imfinzi  1500 Mg IV on day 1 every 3 weeks.  Status post 26 cycles.  Starting from cycle #5 the patient will be on maintenance treatment with Imfinzi  1500 Mg IV every 4 weeks.  2) palliative radiotherapy to enlarging gastrohepatic ligament lymph node under the care of Dr. Eloise Hake expected to be completed on August 26, 2023.  INTERVAL HISTORY: Pedro Pope. 83 y.o. male returns to the clinic today for follow-up visit accompanied by his wife. Discussed the use of AI scribe software for clinical note transcription with the patient, who gave verbal consent to proceed.  History of Present Illness   Pedro Pope. is an 83 year old male with extensive stage small cell lung cancer who presents for evaluation before starting cycle number 27 of maintenance treatment. He is accompanied by his wife.  He was diagnosed with extensive stage small cell lung cancer in June 2023 and has undergone palliative systemic chemotherapy, initially with carboplatin , Etoposide , and Imfinzi  for four cycles. Since then, he has been on maintenance treatment with single-agent Imfinzi  every four weeks, completing a total of 26 cycles so far.  He feels 'pretty good' today. No chest pain, which he has not experienced in quite a while. He experiences shortness of breath on  exertion, attributed to his emphysema, and coughs up some mucus but denies hemoptysis. No nausea, vomiting, diarrhea, rash, or itching.  He mentions a weight gain, stating he is 'almost back up to one fifty' pounds, which has necessitated buying new jeans as his weight has fluctuated.       MEDICAL HISTORY: Past Medical History:  Diagnosis Date   Anosmia    CAD (coronary artery disease), native coronary artery    a. 10/29/2013 antlat STEMI s/p DES to pLAD; residual 80 % proximal and 60% mid LCx diease   Chronic headaches    Dressler's syndrome (HCC)    a. placed on colchicine     ED (erectile dysfunction)    Elevated TSH    Fatty liver    Gout    Ischemic cardiomyopathy    a. 2D ECHO 11/04/14 w/ EF 40-45% with LV WMA, G2DD, mild MR, mild LA dilation, mod TR.   PAF (paroxysmal atrial fibrillation) (HCC)    a. Dx on 11/03/14 admission. Not placed on longterm AC due to DAPT w/ receent DES. Will plan for outpt heart monitor    Pre-diabetes    a. HgA1c 6.1 in 10/2014   Prostate cancer [facility])    s/p prostatectomy and XRT   Rheumatic fever 08/18/1945   Small cell lung cancer (HCC) 01/2022    ALLERGIES:  is allergic to penicillins, clindamycin, corn syrup [glucose], and aspirin  adult low [aspirin ].  MEDICATIONS:  Current Outpatient Medications  Medication Sig Dispense Refill   acetaminophen  (TYLENOL ) 500 MG tablet Take 500 mg by mouth every 6 (six) hours as needed  for mild pain or moderate pain.     albuterol  (VENTOLIN  HFA) 108 (90 Base) MCG/ACT inhaler Inhale 1-2 puffs into the lungs every 6 (six) hours as needed. 8 g 2   ascorbic acid  (VITAMIN C ) 500 MG tablet Take 500 mg by mouth 2 (two) times daily.     Blood Pressure Monitor KIT For daily blood pressure monitoring;  Arm Cuff 1 each 0   carvedilol  (COREG ) 3.125 MG tablet TAKE 1 TABLET(3.125 MG) BY MOUTH TWICE DAILY WITH A MEAL (Patient taking differently: once.) 180 tablet 3   escitalopram (LEXAPRO) 5 MG tablet Take 5 mg by mouth  daily.     hydrocortisone  1 % lotion Apply 1 Application topically 2 (two) times daily. 118 mL 0   hydrOXYzine  (ATARAX ) 10 MG tablet Take 1 tablet (10 mg total) by mouth 3 (three) times daily as needed. 30 tablet 0   PROCTO-MED HC  2.5 % rectal cream Apply topically.     Tiotropium Bromide Monohydrate  (SPIRIVA  RESPIMAT) 1.25 MCG/ACT AERS Inhale 2 puffs into the lungs daily. 1 each 3   Tiotropium Bromide Monohydrate  (SPIRIVA  RESPIMAT) 1.25 MCG/ACT AERS Inhale 2 puffs into the lungs daily.     triamcinolone  cream (KENALOG ) 0.1 % Apply 1 Application topically 2 (two) times daily. 80 g 0   No current facility-administered medications for this visit.   Facility-Administered Medications Ordered in Other Visits  Medication Dose Route Frequency Provider Last Rate Last Admin   sodium chloride  flush (NS) 0.9 % injection 10 mL  10 mL Intracatheter Once Marlene Simas, MD        SURGICAL HISTORY:  Past Surgical History:  Procedure Laterality Date   CORONARY ANGIOPLASTY WITH STENT PLACEMENT  10/30/14   STEMI s/p DES to proximal LAD; residual 80 % proximal and 60% mid LCx diease   IR IMAGING GUIDED PORT INSERTION  03/12/2022   IR PATIENT EVAL TECH 0-60 MINS  10/20/2022   IR PATIENT EVAL TECH 0-60 MINS  10/27/2022   IR PATIENT EVAL TECH 0-60 MINS  10/30/2022   IR PATIENT EVAL TECH 0-60 MINS  10/23/2022   IR PATIENT EVAL TECH 0-60 MINS  11/06/2022   IR PATIENT EVAL TECH 0-60 MINS  11/13/2022   IR PATIENT EVAL TECH 0-60 MINS  12/01/2022   IR RADIOLOGIST EVAL & MGMT  10/02/2022   IR RADIOLOGIST EVAL & MGMT  10/13/2022   IR RADIOLOGIST EVAL & MGMT  10/14/2022   IR RADIOLOGIST EVAL & MGMT  10/17/2022   IR RADIOLOGIST EVAL & MGMT  10/20/2022   IR REMOVAL TUN ACCESS W/ PORT W/O FL MOD SED  10/13/2022   LEFT HEART CATHETERIZATION WITH CORONARY ANGIOGRAM N/A 10/30/2014   Procedure: LEFT HEART CATHETERIZATION WITH CORONARY ANGIOGRAM;  Surgeon: Avanell Leigh, MD;  Location: [facility] CATH LAB;  Service: Cardiovascular;   Laterality: N/A;   PERCUTANEOUS CORONARY STENT INTERVENTION (PCI-S)  10/30/2014   Procedure: PERCUTANEOUS CORONARY STENT INTERVENTION (PCI-S);  Surgeon: Avanell Leigh, MD;  Location: [facility] CATH LAB;  Service: Cardiovascular;;   PROSTATECTOMY     THORACENTESIS Left 12/31/2021   Procedure: THORACENTESIS;  Surgeon: Josiah Nigh, MD;  Location: [facility] ENDOSCOPY;  Service: Pulmonary;  Laterality: Left;   TONSILLECTOMY     VIDEO ASSISTED THORACOSCOPY (VATS)/DECORTICATION Left 01/21/2022   Procedure: VIDEO ASSISTED THORACOSCOPY (VATS)/DECORTICATION;  Surgeon: Hilarie Lovely, MD;  Location: [facility] OR;  Service: Thoracic;  Laterality: Left;    REVIEW OF SYSTEMS:  A comprehensive review of systems was negative.   PHYSICAL EXAMINATION:  General appearance: alert, cooperative, and no distress Head: Normocephalic, without obvious abnormality, atraumatic Neck: no adenopathy, no JVD, supple, symmetrical, trachea midline, and thyroid  not enlarged, symmetric, no tenderness/mass/nodules Lymph nodes: Cervical, supraclavicular, and axillary nodes normal. Resp: clear to auscultation bilaterally Back: symmetric, no curvature. ROM normal. No CVA tenderness. Cardio: regular rate and rhythm, S1, S2 normal, no murmur, click, rub or gallop GI: soft, non-tender; bowel sounds normal; no masses,  no organomegaly Extremities: extremities normal, atraumatic, no cyanosis or edema  ECOG PERFORMANCE STATUS: 1 - Symptomatic but completely ambulatory  Blood pressure (!) 117/57, pulse (!) 58, temperature 98 F (36.7 C), temperature source Temporal, resp. rate 15, height 5\' 9"  (1.753 m), weight 149 lb 8 oz (67.8 kg), SpO2 97%.  LABORATORY DATA: Lab Results  Component Value Date   WBC 7.8 01/04/2024   HGB 13.2 01/04/2024   HCT 39.5 01/04/2024   MCV 90.4 01/04/2024   PLT 151 01/04/2024      Chemistry      Component Value Date/Time   NA 140 12/07/2023 0900   K 4.2 12/07/2023 0900   CL 107 12/07/2023 0900   CO2 28  12/07/2023 0900   BUN 25 (H) 12/07/2023 0900   CREATININE 0.99 12/07/2023 0900      Component Value Date/Time   CALCIUM  9.2 12/07/2023 0900   ALKPHOS 69 12/07/2023 0900   AST 21 12/07/2023 0900   ALT 15 12/07/2023 0900   BILITOT 0.5 12/07/2023 0900       RADIOGRAPHIC STUDIES: No results found.    ASSESSMENT AND PLAN: This is a very pleasant 83 years old white male with Extensive stage (T3, N2, M1a) small cell lung cancer presented with obstructive left upper lobe lung mass in addition to mediastinal lymphadenopathy and malignant left pleural effusion as well as pleural-based metastasis and suspicious liver metastasis diagnosed in June 2023. The patient is currently undergoing systemic chemotherapy with carboplatin  for AUC of 4 on day 1, etoposide  80 Mg/M2 on days 1, 2 and 3 with Imfinzi  240 Mg/M2 on the days before chemotherapy and Imfinzi  1500 Mg IV on day 1 every 3 weeks.  Status post 26 cycles.  Starting from cycle #5 he will be on maintenance treatment with immunotherapy with Imfinzi  1500 Mg IV every 4 weeks.  He has been tolerating this treatment fairly well. He is also undergoing palliative radiotherapy to enlarging gastrohepatic ligament lymph node under the care of Dr. Eloise Hake.  He is expected to complete this palliative radiation on August 26, 2023.    Extensive stage small cell lung cancer Extensive stage small cell lung cancer, diagnosed in June 2023. Status post palliative systemic chemotherapy with carboplatin , Etoposide , and Imfinzi  for four cycles, followed by maintenance treatment with single agent Imfinzi  every four weeks. Currently post a total of 26 cycles, with no reported chest pain, cough, or other significant symptoms. CBC is normal, indicating good tolerance to treatment. - Proceed with cycle 27 of Imfinzi  today. - Order scan of chest, abdomen, and pelvis 7 to 10 days before the next visit to assess disease status.  Emphysema Emphysema with reported dyspnea on  exertion. No acute exacerbation, cough, hemoptysis, or other respiratory symptoms reported.   The patient was advised to call immediately if he has any concerning symptoms in the interval. The patient voices understanding of current disease status and treatment options and is in agreement with the current care plan.  All questions were answered. The patient knows to call the clinic with any problems, questions or  concerns. We can certainly see the patient much sooner if necessary.  The total time spent in the appointment was 20 minutes.  Disclaimer: This note was dictated with voice recognition software. Similar sounding words can inadvertently be transcribed and may not be corrected upon review.

## 2024-01-06 ENCOUNTER — Other Ambulatory Visit: Payer: Self-pay

## 2024-01-07 ENCOUNTER — Encounter (HOSPITAL_BASED_OUTPATIENT_CLINIC_OR_DEPARTMENT_OTHER)

## 2024-01-07 ENCOUNTER — Encounter: Payer: Self-pay | Admitting: Adult Health

## 2024-01-07 ENCOUNTER — Ambulatory Visit (INDEPENDENT_AMBULATORY_CARE_PROVIDER_SITE_OTHER): Admitting: Adult Health

## 2024-01-07 VITALS — BP 134/43 | HR 64 | Ht 69.0 in | Wt 149.4 lb

## 2024-01-07 DIAGNOSIS — J432 Centrilobular emphysema: Secondary | ICD-10-CM

## 2024-01-07 DIAGNOSIS — J849 Interstitial pulmonary disease, unspecified: Secondary | ICD-10-CM | POA: Diagnosis not present

## 2024-01-07 DIAGNOSIS — C3482 Malignant neoplasm of overlapping sites of left bronchus and lung: Secondary | ICD-10-CM | POA: Diagnosis not present

## 2024-01-07 DIAGNOSIS — Z87891 Personal history of nicotine dependence: Secondary | ICD-10-CM

## 2024-01-07 DIAGNOSIS — J9611 Chronic respiratory failure with hypoxia: Secondary | ICD-10-CM | POA: Diagnosis not present

## 2024-01-07 MED ORDER — ALBUTEROL SULFATE HFA 108 (90 BASE) MCG/ACT IN AERS: 1.0000 | INHALATION_SPRAY | Freq: Four times a day (QID) | RESPIRATORY_TRACT | 2 refills | Status: AC | PRN

## 2024-01-07 NOTE — Assessment & Plan Note (Addendum)
 Pulmonary fibrosis with UIP changes on CT imaging.  Most recent CT chest March 2025 showed stable changes.  Pulmonary function testing today shows stable lung function. Continue to monitor activity tolerance and symptom burden..  Plan  Patient Instructions  Continue on Oxygen  2l/m At bedtime   Activity as tolerated each day.  Continue with follow up with Oncology as planned.  Albuterol  inhaler As needed   Follow up with Dr. Bertrum Brodie in 6 months and As needed

## 2024-01-07 NOTE — Assessment & Plan Note (Signed)
 Extensive stage small cell lung cancer-status post chemotherapy.  Currently on immunotherapy.  Surveillance CT chest March 2025 showed stability.-Continue follow-up with oncology.

## 2024-01-07 NOTE — Progress Notes (Signed)
 @Patient  ID: Pedro Pope., male    DOB: [birth year], 83 y.o.   MRN: [id number]  Chief Complaint  Patient presents with   Follow-up    Referring provider: Margarete Sharps, MD  HPI: 83 year old male former smoker followed for COPD with emphysema, interstitial lung disease and extensive stage small cell lung cancer diagnosed in June 2023 with obstructive left upper lobe lung mass in addition to mediastinal lymphadenopathy and malignant left pleural effusion as well as pleural-based metastasis and suspected liver mets.  Status post pleural catheter removed fall 2023 followed by oncology status post systemic chemo with carboplatin .  Currently undergoing immunotherapy with Imfinzi .  TEST/EVENTS :  ILD risk factors/IPF risk factors -Former smoker -Age greater than 83 and Caucasian ethnicity -Progression with classic UIP -He gives a history of Raynaud's but no autoimmune disease -No serology available -Ongoing immunotherapy for lung cancer -Associated emphysema   CT chest June 19, 2022 showed a moderate pulmonary fibrosis with apical to basal gradient, irregular peripheral interstitial opacity and traction bronchiectasis along with honeycombing at the bases.  Moderate emphysema.  Decreased left pleural effusion with associated pleural thickening   CT chest August 25, 2022 showed interval development of abnormal lymph node in the gastrohepatic ligament.  Previous liver lesion not well-seen.  No new liver lesions.  Stable pulmonary fibrosis and emphysematous changes  CT chest October 29, 2023 showed diminished size of enlarged gastrohepatic lymph node, unchanged pretracheal lymph node, unchanged subpleural pulmonary nodule in the lingula measuring 0.8 cm, no evidence of new lymphadenopathy.  Severe emphysema and predominant UIP pattern fibrosis appeared stable.   01/07/2024 Follow up ; COPD, ILD, O2 RF, Lung cancer  Patient presents for a 59-month follow-up.  Patient has COPD with emphysema.   Had previously been on Incruse with no perceived benefit.  Was given Spiriva  samples but feels that really did not make much difference in his breathing.  Prescription was not filled as it was not affordable through his insurance.  Patient says overall his breathing is doing about the same.  He gets short of breath with really heavy activities.  But does not have any significant shortness of breath with light activities and at rest.  Has no significant cough.  Rarely uses albuterol .  Patient was set up for pulmonary function testing that showed stable lung function FEV1 at 86%, ratio 67, FVC 91%, DLCO 45%.   Patient has pulmonary fibrosis with UIP pattern on CT imaging.  Pulmonary function testing appears stable.  Recent CT chest in March also showed stable changes.  Patient denies any change in activity tolerance.  Does have oxygen  that he can use with activity but does not use it on a regular basis.  O2 saturations today on room air walking into the office to exam room was 99% on room air.  Patient says he is fairly active but does not exercise on a regular basis.  He remains on oxygen  2 L at bedtime.  Allergies  Allergen Reactions   Penicillins Anaphylaxis   Clindamycin Other (See Comments)   Corn Syrup [Glucose] Other (See Comments)    High fructose corn syrup causes gout   Aspirin  Adult Low [Aspirin ] Itching and Rash    Dr. Mamie Searles suspects that long term caused a rash, short term use appears to be ok    Immunization History  Administered Date(s) Administered   Fluad Quad(high Dose 65+) 05/26/2022   Fluad Trivalent(High Dose 65+) 05/25/2023   Influenza Split 05/20/2012, 09/01/2013   Influenza, High Dose  Seasonal PF 04/23/2017   Influenza, Quadrivalent, Recombinant, Inj, Pf 04/08/2018, 07/18/2020, 06/03/2021   Influenza,inj,Quad PF,6+ Mos 11/01/2014   PFIZER Comirnaty(Gray Top)Covid-19 Tri-Sucrose Vaccine 12/16/2019, 05/18/2020   PFIZER(Purple Top)SARS-COV-2 Vaccination 10/14/2019,  11/08/2019   PNEUMOCOCCAL CONJUGATE-20 11/20/2022   Pneumococcal Conjugate-13 09/01/2013, 10/01/2017   Pneumococcal Polysaccharide-23 11/01/2014   Tdap 08/18/2008   Zoster, Live 09/01/2013    Past Medical History:  Diagnosis Date   Anosmia    CAD (coronary artery disease), native coronary artery    a. 10/29/2013 antlat STEMI s/p DES to pLAD; residual 80 % proximal and 60% mid LCx diease   Chronic headaches    Dressler's syndrome (HCC)    a. placed on colchicine     ED (erectile dysfunction)    Elevated TSH    Fatty liver    Gout    Ischemic cardiomyopathy    a. 2D ECHO 11/04/14 w/ EF 40-45% with LV WMA, G2DD, mild MR, mild LA dilation, mod TR.   PAF (paroxysmal atrial fibrillation) (HCC)    a. Dx on 11/03/14 admission. Not placed on longterm AC due to DAPT w/ receent DES. Will plan for outpt heart monitor    Pre-diabetes    a. HgA1c 6.1 in 10/2014   Prostate cancer [facility])    s/p prostatectomy and XRT   Rheumatic fever 08/18/1945   Small cell lung cancer (HCC) 01/2022    Tobacco History: Social History   Tobacco Use  Smoking Status Former   Current packs/day: 0.00   Types: Cigarettes   Quit date: 08/18/1992   Years since quitting: 31.4  Smokeless Tobacco Never   Counseling given: Not Answered   Outpatient Medications Prior to Visit  Medication Sig Dispense Refill   acetaminophen  (TYLENOL ) 500 MG tablet Take 500 mg by mouth every 6 (six) hours as needed for mild pain or moderate pain.     ascorbic acid  (VITAMIN C ) 500 MG tablet Take 500 mg by mouth 2 (two) times daily.     Blood Pressure Monitor KIT For daily blood pressure monitoring;  Arm Cuff 1 each 0   carvedilol  (COREG ) 3.125 MG tablet TAKE 1 TABLET(3.125 MG) BY MOUTH TWICE DAILY WITH A MEAL (Patient taking differently: once.) 180 tablet 3   escitalopram (LEXAPRO) 5 MG tablet Take 5 mg by mouth daily.     PROCTO-MED HC  2.5 % rectal cream Apply topically.     triamcinolone  cream (KENALOG ) 0.1 % Apply 1 Application  topically 2 (two) times daily. 80 g 0   Facility-Administered Medications Prior to Visit  Medication Dose Route Frequency Provider Last Rate Last Admin   sodium chloride  flush (NS) 0.9 % injection 10 mL  10 mL Intracatheter Once Marlene Simas, MD         Review of Systems:   Constitutional:   No  weight loss, night sweats,  Fevers, chills, fatigue, or  lassitude.  HEENT:   No headaches,  Difficulty swallowing,  Tooth/dental problems, or  Sore throat,                No sneezing, itching, ear ache, nasal congestion, post nasal drip,   CV:  No chest pain,  Orthopnea, PND, swelling in lower extremities, anasarca, dizziness, palpitations, syncope.   GI  No heartburn, indigestion, abdominal pain, nausea, vomiting, diarrhea, change in bowel habits, loss of appetite, bloody stools.   Resp: No shortness of breath with exertion or at rest.  No excess mucus, no productive cough,  No non-productive cough,  No coughing up of blood.  No change in color of mucus.  No wheezing.  No chest wall deformity  Skin: no rash or lesions.  GU: no dysuria, change in color of urine, no urgency or frequency.  No flank pain, no hematuria   MS:  No joint pain or swelling.  No decreased range of motion.  No back pain.    Physical Exam  BP (!) 134/43 (BP Location: Left Arm, Patient Position: Sitting, Cuff Size: Normal)   Pulse 64   Ht 5\' 9"  (1.753 m)   Wt 149 lb 6.4 oz (67.8 kg)   SpO2 99%   BMI 22.06 kg/m   GEN: A/Ox3; pleasant , NAD, well nourished    HEENT:  [place]/AT,  EACs-clear, TMs-wnl, NOSE-clear, THROAT-clear, no lesions, no postnasal drip or exudate noted.   NECK:  Supple w/ fair ROM; no JVD; normal carotid impulses w/o bruits; no thyromegaly or nodules palpated; no lymphadenopathy.    RESP  Clear  P & A; w/o, wheezes/ rales/ or rhonchi. no accessory muscle use, no dullness to percussion  CARD:  RRR, no m/r/g, no peripheral edema, pulses intact, no cyanosis or clubbing.  GI:   Soft & nt; nml  bowel sounds; no organomegaly or masses detected.   Musco: Warm bil, no deformities or joint swelling noted.   Neuro: alert, no focal deficits noted.    Skin: Warm, no lesions or rashes    Lab Results:  CBC    Component Value Date/Time   WBC 7.8 01/04/2024 0935   WBC 22.2 (H) 02/07/2022 2022   RBC 4.37 01/04/2024 0935   HGB 13.2 01/04/2024 0935   HCT 39.5 01/04/2024 0935   PLT 151 01/04/2024 0935   MCV 90.4 01/04/2024 0935   MCH 30.2 01/04/2024 0935   MCHC 33.4 01/04/2024 0935   RDW 13.0 01/04/2024 0935   LYMPHSABS 1.4 01/04/2024 0935   MONOABS 0.7 01/04/2024 0935   EOSABS 0.5 01/04/2024 0935   BASOSABS 0.0 01/04/2024 0935    BMET    Component Value Date/Time   NA 141 01/04/2024 0935   K 4.2 01/04/2024 0935   CL 108 01/04/2024 0935   CO2 26 01/04/2024 0935   GLUCOSE 107 (H) 01/04/2024 0935   BUN 23 01/04/2024 0935   CREATININE 0.97 01/04/2024 0935   CALCIUM  9.0 01/04/2024 0935   GFRNONAA >60 01/04/2024 0935   GFRAA 75 (L) 11/04/2014 0415    BNP    Component Value Date/Time   BNP 192.2 (H) 01/17/2022 2035    ProBNP No results found for: "PROBNP"  Imaging: No results found.  durvalumab  (IMFINZI ) 1,500 mg in sodium chloride  0.9 % 100 mL chemo infusion     Date Action Dose Route User   11/09/2023 1354 Infusion Verify (none) Intravenous Terrie Fetter, RN   11/09/2023 1354 Rate/Dose Change (none) Intravenous Terrie Fetter, RN   11/09/2023 1354 New Bag/Given 1,500 mg Intravenous Edelmira Goodness, RN      durvalumab  (IMFINZI ) 1,500 mg in sodium chloride  0.9 % 100 mL chemo infusion     Date Action Dose Route User   12/07/2023 1225 Rate/Dose Change (none) Intravenous Rosetta Cons, RN   12/07/2023 1225 Rate/Dose Change (none) Intravenous Rosetta Cons, [place]   12/07/2023 1124 Rate/Dose Change (none) Intravenous Rosetta Cons, [place]   12/07/2023 1124 New Bag/Given 1,500 mg Intravenous Rosetta Cons, RN      durvalumab  (IMFINZI ) 1,500 mg in  sodium chloride  0.9 % 100 mL chemo infusion     Date Action  Dose Route User   01/04/2024 1137 Rate/Dose Change (none) Intravenous Thermon Flattery, RN   01/04/2024 1137 New Bag/Given 1,500 mg Intravenous Thermon Flattery, RN      0.9 %  sodium chloride  infusion     Date Action Dose Route User   11/09/2023 1458 Rate/Dose Change (none) Intravenous Terrie Fetter, RN   11/09/2023 1351 New Bag/Given (none) Intravenous Edelmira Goodness, RN      0.9 %  sodium chloride  infusion     Date Action Dose Route User   12/07/2023 1232 Rate/Dose Change (none) Intravenous Rosetta Cons, [place]   12/07/2023 1229 Rate/Dose Change (none) Intravenous Rosetta Cons, RN   12/07/2023 1102 New Bag/Given (none) Intravenous Rosetta Cons, RN      0.9 %  sodium chloride  infusion     Date Action Dose Route User   01/04/2024 1244 Rate/Dose Change (none) Intravenous Thermon Flattery, RN   01/04/2024 1244 Infusion Verify (none) Intravenous Thermon Flattery, RN   01/04/2024 1239 Rate/Dose Change (none) Intravenous Thermon Flattery, RN   01/04/2024 1228 Rate/Dose Change (none) Intravenous Thermon Flattery, RN   01/04/2024 1030 New Bag/Given (none) Intravenous Thermon Flattery, RN          Latest Ref Rng & Units 12/22/2023    2:11 PM 02/04/2023    1:40 PM 09/30/2022    5:04 PM  PFT Results  FVC-Pre L 3.45  P 3.41    FVC-Predicted Pre % 91  P 90  103   FVC-Post L   3.76   FVC-Predicted Post %   102   Pre FEV1/FVC % % 67  P 62  74   Post FEV1/FCV % %   72   FEV1-Pre L 2.30  P 2.13  2.79   FEV1-Predicted Pre % 86  P 80  108   FEV1-Post L   2.72   DLCO uncorrected ml/min/mmHg 10.73  P 9.84  9.13   DLCO UNC% % 45  P 42  40   DLCO corrected ml/min/mmHg 11.23  P 10.41  9.73   DLCO COR %Predicted % 48  P 44  42   DLVA Predicted % 61  P 58  49   TLC L   6.45   TLC % Predicted %   96   RV % Predicted %   107     P Preliminary result    No results found for: "NITRICOXIDE"      Assessment &  Plan:   Centrilobular emphysema (HCC) Mild COPD with emphysema.  Patient had no perceived benefit with Incruse or Spiriva .  Spiriva  is cost prohibitive.  Currently not having significant symptom burden.  May use albuterol  as needed.  Continue to monitor and can consider looking at GoodRx or alternative inhalers that may be more affordable if needed.  Plan  Patient Instructions  Continue on Oxygen  2l/m At bedtime   Activity as tolerated each day.  Continue with follow up with Oncology as planned.  Albuterol  inhaler As needed   Follow up with Dr. Bertrum Brodie in 6 months and As needed       Chronic respiratory failure with hypoxia (HCC) Continue on oxygen  2 L with activity and at bedtime to maintain O2 saturations greater than 88 to 90%.  ILD (interstitial lung disease) (HCC) Pulmonary fibrosis with UIP changes on CT imaging.  Most recent CT chest March 2025 showed stable changes.  Pulmonary function testing today shows stable lung function. Continue to  monitor activity tolerance and symptom burden..  Plan  Patient Instructions  Continue on Oxygen  2l/m At bedtime   Activity as tolerated each day.  Continue with follow up with Oncology as planned.  Albuterol  inhaler As needed   Follow up with Dr. Bertrum Brodie in 6 months and As needed       Small cell lung cancer, overlapping sites of left lung (HCC) Extensive stage small cell lung cancer-status post chemotherapy.  Currently on immunotherapy.  Surveillance CT chest March 2025 showed stability.-Continue follow-up with oncology.     Roena Clark, NP 01/07/2024

## 2024-01-07 NOTE — Assessment & Plan Note (Signed)
Continue on oxygen 2 L with activity and at bedtime to maintain O2 saturations greater than 88 to 90%

## 2024-01-07 NOTE — Assessment & Plan Note (Signed)
 Mild COPD with emphysema.  Patient had no perceived benefit with Incruse or Spiriva .  Spiriva  is cost prohibitive.  Currently not having significant symptom burden.  May use albuterol  as needed.  Continue to monitor and can consider looking at GoodRx or alternative inhalers that may be more affordable if needed.  Plan  Patient Instructions  Continue on Oxygen  2l/m At bedtime   Activity as tolerated each day.  Continue with follow up with Oncology as planned.  Albuterol  inhaler As needed   Follow up with Dr. Bertrum Brodie in 6 months and As needed

## 2024-01-07 NOTE — Patient Instructions (Addendum)
 Continue on Oxygen  2l/m At bedtime   Activity as tolerated each day.  Continue with follow up with Oncology as planned.  Albuterol  inhaler As needed   Follow up with Dr. Bertrum Brodie in 6 months and As needed

## 2024-01-22 ENCOUNTER — Other Ambulatory Visit: Payer: Self-pay | Admitting: Internal Medicine

## 2024-01-22 ENCOUNTER — Ambulatory Visit (HOSPITAL_COMMUNITY)
Admission: RE | Admit: 2024-01-22 | Discharge: 2024-01-22 | Disposition: A | Discharge status: Home / Self Care | Source: Ambulatory Visit | Attending: Internal Medicine | Admitting: Internal Medicine

## 2024-01-22 DIAGNOSIS — K429 Umbilical hernia without obstruction or gangrene: Secondary | ICD-10-CM | POA: Diagnosis not present

## 2024-01-22 DIAGNOSIS — C3490 Malignant neoplasm of unspecified part of unspecified bronchus or lung: Secondary | ICD-10-CM | POA: Diagnosis not present

## 2024-01-22 DIAGNOSIS — J439 Emphysema, unspecified: Secondary | ICD-10-CM | POA: Diagnosis not present

## 2024-01-22 DIAGNOSIS — K5730 Diverticulosis of large intestine without perforation or abscess without bleeding: Secondary | ICD-10-CM | POA: Diagnosis not present

## 2024-01-22 DIAGNOSIS — N200 Calculus of kidney: Secondary | ICD-10-CM | POA: Diagnosis not present

## 2024-01-22 MED ORDER — IOHEXOL 300 MG/ML  SOLN
100.0000 mL | Freq: Once | INTRAMUSCULAR | Status: AC | PRN
  Administered 2024-01-22: 100 mL via INTRAVENOUS

## 2024-01-25 ENCOUNTER — Other Ambulatory Visit: Payer: Self-pay | Admitting: Internal Medicine

## 2024-01-25 DIAGNOSIS — C3482 Malignant neoplasm of overlapping sites of left bronchus and lung: Secondary | ICD-10-CM

## 2024-01-27 NOTE — Progress Notes (Signed)
 [facility] OFFICE PROGRESS NOTE  Margarete Sharps, MD [address] [place] [address]  DIAGNOSIS: Extensive stage (T3, N2, M1b) small cell lung cancer presented with obstructive left upper lobe lung mass in addition to mediastinal lymphadenopathy and malignant left pleural effusion as well as pleural-based metastasis as well as suspicious liver metastasis diagnosed in June 2023   PRIOR THERAPY: Palliative radiotherapy to enlarging gastrohepatic ligament lymph node under the care of Dr. Eloise Hake completed January 8th, 2025.   CURRENT THERAPY:  Palliative systemic chemotherapy with carboplatin  initiated for AUC of 4 on day 1 and etoposide  80 Mg/M2 on days 1, 2 and 3 with Cosela  before the chemotherapy and Imfinzi  1500 Mg IV on day 1 every 3 weeks.  Status post 27 cycles.  Starting from cycle #5 the patient will be on maintenance treatment with Imfinzi  1500 Mg IV every 4 weeks.    INTERVAL HISTORY: Farron Watrous. 83 y.o. male returns  to the clinic today for a follow-up visit accompanied by his son. The patient was last seen by Dr. Marguerita Shih 4 weeks ago.     Otherwise he denies any major changes in his health since he was last seen.  He denies any fever, chills, or night sweats. His appetite is good. He lost a few pounds though. He may get mild dyspnea on exertion secondary to his emphysema which he states is stable. He saw his pulmonologist recently. The patient sometimes has cough secondary to nasal congestion/mucus which is also unchanged. He denies nausea, vomiting, diarrhea or constipation. He denies any hemoptysis.  He sometimes gets a rash on his right forehead and right side of his neck from his immunotherapy which does not bother him much. He knows he can use steroid cream for itching if needed but he does not need to use this often. Overall, his rash is better at this time. He recently had a restaging CT scan performed.  He is here today for evaluation and repeat blood work  before undergoing cycle #28   MEDICAL HISTORY: Past Medical History:  Diagnosis Date   Anosmia    CAD (coronary artery disease), native coronary artery    a. 10/29/2013 antlat STEMI s/p DES to pLAD; residual 80 % proximal and 60% mid LCx diease   Chronic headaches    Dressler's syndrome (HCC)    a. placed on colchicine     ED (erectile dysfunction)    Elevated TSH    Fatty liver    Gout    Ischemic cardiomyopathy    a. 2D ECHO 11/04/14 w/ EF 40-45% with LV WMA, G2DD, mild MR, mild LA dilation, mod TR.   PAF (paroxysmal atrial fibrillation) (HCC)    a. Dx on 11/03/14 admission. Not placed on longterm AC due to DAPT w/ receent DES. Will plan for outpt heart monitor    Pre-diabetes    a. HgA1c 6.1 in 10/2014   Prostate cancer [facility])    s/p prostatectomy and XRT   Rheumatic fever 08/18/1945   Small cell lung cancer (HCC) 01/2022    ALLERGIES:  is allergic to penicillins, clindamycin, corn syrup [glucose], and aspirin  adult low [aspirin ].  MEDICATIONS:  Current Outpatient Medications  Medication Sig Dispense Refill   acetaminophen  (TYLENOL ) 500 MG tablet Take 500 mg by mouth every 6 (six) hours as needed for mild pain or moderate pain.     albuterol  (VENTOLIN  HFA) 108 (90 Base) MCG/ACT inhaler Inhale 1-2 puffs into the lungs every 6 (six) hours as needed. 8  g 2   ascorbic acid  (VITAMIN C ) 500 MG tablet Take 500 mg by mouth 2 (two) times daily.     Blood Pressure Monitor KIT For daily blood pressure monitoring;  Arm Cuff 1 each 0   carvedilol  (COREG ) 3.125 MG tablet TAKE 1 TABLET(3.125 MG) BY MOUTH TWICE DAILY WITH A MEAL 180 tablet 3   escitalopram (LEXAPRO) 5 MG tablet Take 5 mg by mouth daily.     PROCTO-MED HC  2.5 % rectal cream Apply topically.     triamcinolone  cream (KENALOG ) 0.1 % Apply 1 Application topically 2 (two) times daily. 80 g 0   No current facility-administered medications for this visit.   Facility-Administered Medications Ordered in Other Visits  Medication Dose  Route Frequency Provider Last Rate Last Admin   sodium chloride  flush (NS) 0.9 % injection 10 mL  10 mL Intracatheter Once Marlene Simas, MD        SURGICAL HISTORY:  Past Surgical History:  Procedure Laterality Date   CORONARY ANGIOPLASTY WITH STENT PLACEMENT  10/30/14   STEMI s/p DES to proximal LAD; residual 80 % proximal and 60% mid LCx diease   IR IMAGING GUIDED PORT INSERTION  03/12/2022   IR PATIENT EVAL TECH 0-60 MINS  10/20/2022   IR PATIENT EVAL TECH 0-60 MINS  10/27/2022   IR PATIENT EVAL TECH 0-60 MINS  10/30/2022   IR PATIENT EVAL TECH 0-60 MINS  10/23/2022   IR PATIENT EVAL TECH 0-60 MINS  11/06/2022   IR PATIENT EVAL TECH 0-60 MINS  11/13/2022   IR PATIENT EVAL TECH 0-60 MINS  12/01/2022   IR RADIOLOGIST EVAL & MGMT  10/02/2022   IR RADIOLOGIST EVAL & MGMT  10/13/2022   IR RADIOLOGIST EVAL & MGMT  10/14/2022   IR RADIOLOGIST EVAL & MGMT  10/17/2022   IR RADIOLOGIST EVAL & MGMT  10/20/2022   IR REMOVAL TUN ACCESS W/ PORT W/O FL MOD SED  10/13/2022   LEFT HEART CATHETERIZATION WITH CORONARY ANGIOGRAM N/A 10/30/2014   Procedure: LEFT HEART CATHETERIZATION WITH CORONARY ANGIOGRAM;  Surgeon: Avanell Leigh, MD;  Location: [facility] CATH LAB;  Service: Cardiovascular;  Laterality: N/A;   PERCUTANEOUS CORONARY STENT INTERVENTION (PCI-S)  10/30/2014   Procedure: PERCUTANEOUS CORONARY STENT INTERVENTION (PCI-S);  Surgeon: Avanell Leigh, MD;  Location: [facility] CATH LAB;  Service: Cardiovascular;;   PROSTATECTOMY     THORACENTESIS Left 12/31/2021   Procedure: THORACENTESIS;  Surgeon: Josiah Nigh, MD;  Location: [facility] ENDOSCOPY;  Service: Pulmonary;  Laterality: Left;   TONSILLECTOMY     VIDEO ASSISTED THORACOSCOPY (VATS)/DECORTICATION Left 01/21/2022   Procedure: VIDEO ASSISTED THORACOSCOPY (VATS)/DECORTICATION;  Surgeon: Hilarie Lovely, MD;  Location: [facility] OR;  Service: Thoracic;  Laterality: Left;    REVIEW OF SYSTEMS:   Review of Systems  Constitutional: Positive for stable fatigue. Negative for  appetite change, chills,  fever and unexpected weight change.  HENT: Negative for mouth sores, nosebleeds, sore throat and trouble swallowing.   Eyes: Negative for eye problems and icterus.  Respiratory: Positive for mild dyspnea on exertion. Negative for cough, hemoptysis, and wheezing.    Gastrointestinal: Negative for constipation, diarrhea, nausea and vomiting.  Genitourinary: Negative for bladder incontinence, difficulty urinating, dysuria, frequency and hematuria.   Musculoskeletal: Negative for back pain, gait problem, neck pain and neck stiffness.  Skin: Positive for improved rash on right forehead and right neck.  Neurological: Negative for dizziness, extremity weakness, gait problem, headaches, light-headedness and seizures.  Hematological: Negative for adenopathy. Does not bruise/bleed  easily.  Psychiatric/Behavioral:  Negative for confusion, depression and sleep disturbance. The patient is not nervous/anxious.   PHYSICAL EXAMINATION:  Blood pressure 120/63, pulse (!) 55, temperature 98.1 F (36.7 C), temperature source Temporal, resp. rate 17, height 5' 9 (1.753 m), weight 146 lb 11.2 oz (66.5 kg), SpO2 97%.  ECOG PERFORMANCE STATUS: 1  Physical Exam  Constitutional: Oriented to person, place, and time and well-developed, well-nourished, and in no distress.  HENT:  Head: Normocephalic and atraumatic.  Mouth/Throat: Oropharynx is clear and moist. No oropharyngeal exudate.  Eyes: Conjunctivae are normal. Right eye exhibits no discharge. Left eye exhibits no discharge. No scleral icterus.  Neck: Normal range of motion. Neck supple.  Cardiovascular: Normal rate, regular rhythm, normal heart sounds and intact distal pulses.   Pulmonary/Chest: Effort normal and breath sounds normal. No respiratory distress. No wheezes. No rales.  Abdominal: Soft. Bowel sounds are normal. Exhibits no distension and no mass. There is no tenderness.  Musculoskeletal: Normal range of motion. Exhibits  no edema. tenderness.  Lymphadenopathy:    No cervical adenopathy.  Neurological: Alert and oriented to person, place, and time. Exhibits normal muscle tone. Gait normal. Coordination normal.  Skin: Skin is warm and dry. No rash noted. Not diaphoretic. No erythema. No pallor.  Psychiatric: Mood, memory and judgment normal.  Vitals reviewed.    LABORATORY DATA: Lab Results  Component Value Date   WBC 7.3 02/01/2024   HGB 13.9 02/01/2024   HCT 41.6 02/01/2024   MCV 89.3 02/01/2024   PLT 171 02/01/2024      Chemistry      Component Value Date/Time   NA 141 02/01/2024 0936   K 4.3 02/01/2024 0936   CL 109 02/01/2024 0936   CO2 25 02/01/2024 0936   BUN 21 02/01/2024 0936   CREATININE 1.04 02/01/2024 0936      Component Value Date/Time   CALCIUM  9.0 02/01/2024 0936   ALKPHOS 67 02/01/2024 0936   AST 19 02/01/2024 0936   ALT 9 02/01/2024 0936   BILITOT 0.5 02/01/2024 0936       RADIOGRAPHIC STUDIES:  CT CHEST ABDOMEN PELVIS W CONTRAST Result Date: 01/22/2024 CLINICAL DATA:  Small-cell lung cancer restaging * Tracking Code: BO * EXAM: CT CHEST, ABDOMEN, AND PELVIS WITH CONTRAST TECHNIQUE: Multidetector CT imaging of the chest, abdomen and pelvis was performed following the standard protocol during bolus administration of intravenous contrast. RADIATION DOSE REDUCTION: This exam was performed according to the departmental dose-optimization program which includes automated exposure control, adjustment of the mA and/or kV according to patient size and/or use of iterative reconstruction technique. CONTRAST:  OMNIPAQUE  IOHEXOL  300 MG/ML  SOLN COMPARISON:  10/29/2023 FINDINGS: CT CHEST FINDINGS Cardiovascular: Severe aortic atherosclerosis. Normal heart size. Three-vessel coronary artery calcifications. No pericardial effusion. Mediastinum/Nodes: Unchanged prominent pretracheal lymph node measuring 1.3 x 0.8 cm (series 2, image 30). No other enlarged mediastinal, hilar, or axillary  lymph nodes. Thyroid  gland, trachea, and esophagus demonstrate no significant findings. Lungs/Pleura: Severe emphysema and bibasilar predominant UIP pattern fibrosis, unchanged. Unchanged subpleural nodule of the peripheral lingula measuring 0.7 cm (series 4, image 138). No pleural effusion or pneumothorax. Musculoskeletal: No chest wall abnormality. No acute osseous findings. CT ABDOMEN PELVIS FINDINGS Hepatobiliary: No solid liver abnormality is seen. No gallstones, gallbladder wall thickening, or biliary dilatation. Pancreas: Unremarkable. No pancreatic ductal dilatation or surrounding inflammatory changes. Spleen: Normal in size without significant abnormality. Adrenals/Urinary Tract: Adrenal glands are unremarkable. Small nonobstructive calculus of the inferior pole of the right kidney.  No left-sided calculi, ureteral calculi, or hydronephrosis bladder is unremarkable. Stomach/Bowel: Stomach is within normal limits. Appendix appears normal. No evidence of bowel wall thickening, distention, or inflammatory changes. Sigmoid diverticulosis. Vascular/Lymphatic: Severe, irregular aortic atherosclerosis. Unchanged small treated gastrohepatic ligament lymph node measuring 0.5 cm (series 2, image 61). No enlarged abdominal or pelvic lymph nodes. Reproductive: Status post prostatectomy. Other: Small umbilical hernia.  No ascites. Musculoskeletal: No acute osseous findings. IMPRESSION: 1. Unchanged prominent pretracheal lymph node and small treated gastrohepatic ligament lymph node. No evidence of new metastatic disease in the chest, abdomen, or pelvis. 2. Unchanged subpleural nodule of the peripheral lingula measuring 0.7 cm. 3. Severe emphysema and bibasilar predominant UIP pattern fibrosis, unchanged. 4. Coronary artery disease. 5. Nonobstructive right nephrolithiasis. 6. Status post prostatectomy. Aortic Atherosclerosis (ICD10-I70.0) and Emphysema (ICD10-J43.9). Electronically Signed   By: Fredricka Jenny M.D.   On:  01/22/2024 16:33     ASSESSMENT/PLAN:  This is a very pleasant 83 year old Caucasian male diagnosed with extensive stage (T3, N2, M1 a) small cell lung cancer.  He presented with an obstructive left upper lobe lung mass in addition to mediastinal lymphadenopathy.  He also has a malignant left pleural effusion and pleural-based metastases.  He also has a suspicious liver metastasis.  He was diagnosed in June 2023.    The patient is currently undergoing slightly reduced dose systemic chemotherapy with carboplatin  for an AUC of 4 on day 1, etoposide  80 mg per metered squared on days 1 2, and 3 with Imfinzi  on day 1 every 3 weeks.  He is status post 27 cycles.  Starting from cycle #5, the patient started maintenance immunotherapy with Imfinzi  IV every 4 weeks.  The patient was seen with Dr. Marguerita Shih today.  Dr. Marguerita Shih personally and independently reviewed the scan and discussed results with the patient today.  The scan showed no evidence of disease progression.  Dr. Marguerita Shih recommends he continue on the same treatment at the same dose.   Labs were reviewed. Recommend he proceed with cycle #28 today as scheduled.    See him back for follow-up visit in 4 weeks for evaluation repeat blood work before undergoing cycle #29.   Rash on forehead Chronic rash stable. Advised on steroid cream use for symptom management. - Use steroid cream as needed for itching or bothersome symptoms.  He had questions about travel. There are no travel restrictions; however, we did discuss precautions with infection in public places such as airports.   The patient was advised to call immediately if he has any concerning symptoms in the interval. The patient voices understanding of current disease status and treatment options and is in agreement with the current care plan. All questions were answered. The patient knows to call the clinic with any problems, questions or concerns. We can certainly see the patient much sooner  if necessary     No orders of the defined types were placed in this encounter.    Shandra Szymborski L Mansoor Hillyard, PA-C 02/01/24  ADDENDUM: Hematology/Oncology Attending: I had a face-to-face encounter with the patient today.  I reviewed his record, lab, scan and recommended his care plan.  This is a very pleasant 83 years old white male with extensive stage small cell lung cancer diagnosed in June 2023.  He is status post palliative radiotherapy to enlarging gastrohepatic ligament lymph node in January 2025.  The patient started palliative systemic chemotherapy after his initial treatment with carboplatin , etoposide  and Imfinzi  for 4 cycles and starting from cycle #5 he has  been on maintenance treatment with single agent Imfinzi  every 4 weeks status post a total of 27 cycles. He has been tolerating his treatment fairly well with no concerning adverse effects. He had repeat CT scan of the chest, abdomen and pelvis performed recently.  I personally and independently reviewed the scan and discussed the result with the patient and his son.  His scan showed no concerning findings for disease progression. I recommended for him to continue his current treatment with Imfinzi  and he will proceed with cycle #28 today. He has a plan for a long trip to [place]  to visit several family members on the way there. The patient was advised to call immediately if he has any other concerning symptoms in the interval. The total time spent in the appointment was 30 minutes including review of chart and various tests results, discussions about plan of care and coordination of care plan . Disclaimer: This note was dictated with voice recognition software. Similar sounding words can inadvertently be transcribed and may be missed upon review.  Aurelio Blower, MD

## 2024-02-01 ENCOUNTER — Inpatient Hospital Stay: Attending: Internal Medicine

## 2024-02-01 ENCOUNTER — Inpatient Hospital Stay

## 2024-02-01 ENCOUNTER — Inpatient Hospital Stay: Attending: Internal Medicine | Admitting: Physician Assistant

## 2024-02-01 VITALS — BP 120/63 | HR 55 | Temp 98.1°F | Resp 17 | Ht 69.0 in | Wt 146.7 lb

## 2024-02-01 DIAGNOSIS — J910 Malignant pleural effusion: Secondary | ICD-10-CM | POA: Diagnosis not present

## 2024-02-01 DIAGNOSIS — Z79620 Long term (current) use of immunosuppressive biologic: Secondary | ICD-10-CM | POA: Insufficient documentation

## 2024-02-01 DIAGNOSIS — C3412 Malignant neoplasm of upper lobe, left bronchus or lung: Secondary | ICD-10-CM | POA: Diagnosis not present

## 2024-02-01 DIAGNOSIS — C3482 Malignant neoplasm of overlapping sites of left bronchus and lung: Secondary | ICD-10-CM

## 2024-02-01 DIAGNOSIS — R21 Rash and other nonspecific skin eruption: Secondary | ICD-10-CM | POA: Insufficient documentation

## 2024-02-01 DIAGNOSIS — Z5112 Encounter for antineoplastic immunotherapy: Secondary | ICD-10-CM | POA: Diagnosis not present

## 2024-02-01 DIAGNOSIS — C782 Secondary malignant neoplasm of pleura: Secondary | ICD-10-CM | POA: Diagnosis not present

## 2024-02-01 LAB — CBC WITH DIFFERENTIAL (CANCER CENTER ONLY)
Abs Immature Granulocytes: 0.02 10*3/uL (ref 0.00–0.07)
Basophils Absolute: 0 10*3/uL (ref 0.0–0.1)
Basophils Relative: 0 %
Eosinophils Absolute: 0.4 10*3/uL (ref 0.0–0.5)
Eosinophils Relative: 6 %
HCT: 41.6 % (ref 39.0–52.0)
Hemoglobin: 13.9 g/dL (ref 13.0–17.0)
Immature Granulocytes: 0 %
Lymphocytes Relative: 21 %
Lymphs Abs: 1.5 10*3/uL (ref 0.7–4.0)
MCH: 29.8 pg (ref 26.0–34.0)
MCHC: 33.4 g/dL (ref 30.0–36.0)
MCV: 89.3 fL (ref 80.0–100.0)
Monocytes Absolute: 0.8 10*3/uL (ref 0.1–1.0)
Monocytes Relative: 11 %
Neutro Abs: 4.5 10*3/uL (ref 1.7–7.7)
Neutrophils Relative %: 62 %
Platelet Count: 171 10*3/uL (ref 150–400)
RBC: 4.66 MIL/uL (ref 4.22–5.81)
RDW: 12.9 % (ref 11.5–15.5)
WBC Count: 7.3 10*3/uL (ref 4.0–10.5)
nRBC: 0 % (ref 0.0–0.2)

## 2024-02-01 LAB — CMP (CANCER CENTER ONLY)
ALT: 9 U/L (ref 0–44)
AST: 19 U/L (ref 15–41)
Albumin: 4 g/dL (ref 3.5–5.0)
Alkaline Phosphatase: 67 U/L (ref 38–126)
Anion gap: 7 (ref 5–15)
BUN: 21 mg/dL (ref 8–23)
CO2: 25 mmol/L (ref 22–32)
Calcium: 9 mg/dL (ref 8.9–10.3)
Chloride: 109 mmol/L (ref 98–111)
Creatinine: 1.04 mg/dL (ref 0.61–1.24)
GFR, Estimated: >60 mL/min (ref >60)
Glucose, Bld: 103 mg/dL — ABNORMAL HIGH (ref 70–99)
Potassium: 4.3 mmol/L (ref 3.5–5.1)
Sodium: 141 mmol/L (ref 135–145)
Total Bilirubin: 0.5 mg/dL (ref 0.0–1.2)
Total Protein: 6.8 g/dL (ref 6.5–8.1)

## 2024-02-01 LAB — TSH: TSH: 6.47 u[IU]/mL — ABNORMAL HIGH (ref 0.350–4.500)

## 2024-02-01 MED ORDER — SODIUM CHLORIDE 0.9 % IV SOLN: Freq: Once | INTRAVENOUS | Status: AC

## 2024-02-01 MED ORDER — SODIUM CHLORIDE 0.9 % IV SOLN
1500.0000 mg | Freq: Once | INTRAVENOUS | Status: AC
  Filled 2024-02-01: qty 30

## 2024-02-01 NOTE — Patient Instructions (Signed)

## 2024-02-02 ENCOUNTER — Telehealth: Payer: Self-pay | Admitting: Internal Medicine

## 2024-02-02 LAB — T4: T4, Total: 4.8 ug/dL (ref 4.5–12.0)

## 2024-02-02 NOTE — Telephone Encounter (Signed)
 Scheduled appointments per WQ. Talked with the patient and he is aware of the made appointments.

## 2024-02-03 ENCOUNTER — Other Ambulatory Visit: Payer: Self-pay

## 2024-02-12 ENCOUNTER — Other Ambulatory Visit: Payer: Self-pay

## 2024-02-29 ENCOUNTER — Inpatient Hospital Stay (HOSPITAL_BASED_OUTPATIENT_CLINIC_OR_DEPARTMENT_OTHER): Admitting: Internal Medicine

## 2024-02-29 ENCOUNTER — Inpatient Hospital Stay

## 2024-02-29 ENCOUNTER — Inpatient Hospital Stay: Attending: Internal Medicine

## 2024-02-29 VITALS — BP 108/70 | HR 54 | Temp 98.2°F | Resp 17 | Ht 69.0 in | Wt 147.0 lb

## 2024-02-29 DIAGNOSIS — C787 Secondary malignant neoplasm of liver and intrahepatic bile duct: Secondary | ICD-10-CM | POA: Diagnosis not present

## 2024-02-29 DIAGNOSIS — C3482 Malignant neoplasm of overlapping sites of left bronchus and lung: Secondary | ICD-10-CM

## 2024-02-29 DIAGNOSIS — J910 Malignant pleural effusion: Secondary | ICD-10-CM | POA: Insufficient documentation

## 2024-02-29 DIAGNOSIS — Z5112 Encounter for antineoplastic immunotherapy: Secondary | ICD-10-CM | POA: Insufficient documentation

## 2024-02-29 DIAGNOSIS — C3412 Malignant neoplasm of upper lobe, left bronchus or lung: Secondary | ICD-10-CM | POA: Diagnosis not present

## 2024-02-29 DIAGNOSIS — C782 Secondary malignant neoplasm of pleura: Secondary | ICD-10-CM | POA: Insufficient documentation

## 2024-02-29 LAB — CMP (CANCER CENTER ONLY)
ALT: 9 U/L (ref 0–44)
AST: 17 U/L (ref 15–41)
Albumin: 3.7 g/dL (ref 3.5–5.0)
Alkaline Phosphatase: 61 U/L (ref 38–126)
Anion gap: 4 — ABNORMAL LOW (ref 5–15)
BUN: 24 mg/dL — ABNORMAL HIGH (ref 8–23)
CO2: 28 mmol/L (ref 22–32)
Calcium: 8.6 mg/dL — ABNORMAL LOW (ref 8.9–10.3)
Chloride: 109 mmol/L (ref 98–111)
Creatinine: 0.97 mg/dL (ref 0.61–1.24)
GFR, Estimated: >60 mL/min (ref >60)
Glucose, Bld: 93 mg/dL (ref 70–99)
Potassium: 4.2 mmol/L (ref 3.5–5.1)
Sodium: 141 mmol/L (ref 135–145)
Total Bilirubin: 0.5 mg/dL (ref 0.0–1.2)
Total Protein: 6.1 g/dL — ABNORMAL LOW (ref 6.5–8.1)

## 2024-02-29 LAB — CBC WITH DIFFERENTIAL (CANCER CENTER ONLY)
Abs Immature Granulocytes: 0.01 K/uL (ref 0.00–0.07)
Basophils Absolute: 0 K/uL (ref 0.0–0.1)
Basophils Relative: 1 %
Eosinophils Absolute: 0.4 K/uL (ref 0.0–0.5)
Eosinophils Relative: 6 %
HCT: 39.3 % (ref 39.0–52.0)
Hemoglobin: 13.1 g/dL (ref 13.0–17.0)
Immature Granulocytes: 0 %
Lymphocytes Relative: 20 %
Lymphs Abs: 1.3 K/uL (ref 0.7–4.0)
MCH: 29.9 pg (ref 26.0–34.0)
MCHC: 33.3 g/dL (ref 30.0–36.0)
MCV: 89.7 fL (ref 80.0–100.0)
Monocytes Absolute: 0.6 K/uL (ref 0.1–1.0)
Monocytes Relative: 10 %
Neutro Abs: 4.1 K/uL (ref 1.7–7.7)
Neutrophils Relative %: 63 %
Platelet Count: 150 K/uL (ref 150–400)
RBC: 4.38 MIL/uL (ref 4.22–5.81)
RDW: 13.2 % (ref 11.5–15.5)
WBC Count: 6.5 K/uL (ref 4.0–10.5)
nRBC: 0 % (ref 0.0–0.2)

## 2024-02-29 MED ORDER — SODIUM CHLORIDE 0.9 % IV SOLN
1500.0000 mg | Freq: Once | INTRAVENOUS | Status: AC
  Administered 2024-02-29: 1500 mg via INTRAVENOUS
  Filled 2024-02-29: qty 30

## 2024-02-29 MED ORDER — SODIUM CHLORIDE 0.9 % IV SOLN: Freq: Once | INTRAVENOUS | Status: AC

## 2024-02-29 NOTE — Patient Instructions (Signed)
  VISIT SUMMARY: You had a follow-up appointment to evaluate your condition before starting the 29th cycle of your maintenance therapy for extensive stage small cell lung cancer. You reported feeling generally good with no major complaints, although you mentioned occasional pain in your right eye and a brief episode of pain in your left lower abdomen.  YOUR PLAN: -EXTENSIVE STAGE SMALL CELL LUNG CANCER: This is a type of lung cancer that has spread beyond the lungs. You have been on maintenance therapy with durvalumab  (Imfinzi ) every four weeks and have completed 28 cycles. We will proceed with the 29th cycle of your maintenance treatment today. Your lab work is satisfactory, and you reported no major complaints such as chest pain, breathing issues, nausea, vomiting, or diarrhea.  -RIGHT EYE PAIN: You mentioned occasional pain in your right eye without any changes in vision or blurred vision. We will monitor this, and you should report any changes in vision or if the pain persists.  -LEFT LOWER QUADRANT PAIN: You experienced some pain in your left lower abdomen yesterday but did not have any constipation, gas, or other gastrointestinal symptoms. We advise you to monitor this pain and report if it persists or worsens.  INSTRUCTIONS: Please proceed with the 29th cycle of your durvalumab  (Imfinzi ) maintenance treatment today. Monitor your right eye pain and report any changes in vision or if the pain persists. Also, keep an eye on the pain in your left lower abdomen and let us  know if it continues or gets worse.                      Contains text generated by [employer]bridge.                                 Contains text generated by Abridge.

## 2024-02-29 NOTE — Progress Notes (Signed)
 [facility] [phone]   [phone]  OFFICE PROGRESS NOTE  [name], MD [address] [place] [address]  DIAGNOSIS: Extensive stage (T3, N2, M1b) small cell lung cancer presented with obstructive left upper lobe lung mass in addition to mediastinal lymphadenopathy and malignant left pleural effusion as well as pleural-based metastasis as well as suspicious liver metastasis diagnosed in June 2023.  PRIOR THERAPY: None  CURRENT THERAPY:  1) Palliative systemic chemotherapy with carboplatin  initiated for AUC of 4 on day 1 and etoposide  80 Mg/M2 on days 1, 2 and 3 with Cosela  before the chemotherapy and Imfinzi  1500 Mg IV on day 1 every 3 weeks.  Status post 28 cycles.  Starting from cycle #5 the patient will be on maintenance treatment with Imfinzi  1500 Mg IV every 4 weeks.  2) palliative radiotherapy to enlarging gastrohepatic ligament lymph node under the care of Dr. Shannon expected to be completed on August 26, 2023.  INTERVAL HISTORY: Pedro Pope. 83 y.o. male returns to the clinic today for follow-up visit. Discussed the use of AI scribe software for clinical note transcription with the patient, who gave verbal consent to proceed.  History of Present Illness   An 83 year old male with extensive stage small cell lung cancer presents for evaluation before starting cycle number 29 of maintenance therapy.  Diagnosed with extensive stage small cell lung cancer in June 2023, he initially underwent palliative systemic chemotherapy with carboplatin , etoposide , and durvalumab  every three weeks for four cycles. Since then, he has been on maintenance therapy with durvalumab  every four weeks.  He feels generally good with no major complaints. He mentions occasional pain in his right eye but denies any visual changes, disturbances, or blurred vision associated with this pain. No chest pain, breathing issues, nausea, vomiting, or diarrhea.  He  experienced some pain in his left lower quadrant yesterday but denies any constipation, gas, or other gastrointestinal symptoms.        MEDICAL HISTORY: Past Medical History:  Diagnosis Date   Anosmia    CAD (coronary artery disease), native coronary artery    a. 10/29/2013 antlat STEMI s/p DES to pLAD; residual 80 % proximal and 60% mid LCx diease   Chronic headaches    Dressler's syndrome (HCC)    a. placed on colchicine     ED (erectile dysfunction)    Elevated TSH    Fatty liver    Gout    Ischemic cardiomyopathy    a. 2D ECHO 11/04/14 w/ EF 40-45% with LV WMA, G2DD, mild MR, mild LA dilation, mod TR.   PAF (paroxysmal atrial fibrillation) (HCC)    a. Dx on 11/03/14 admission. Not placed on longterm AC due to DAPT w/ receent DES. Will plan for outpt heart monitor    Pre-diabetes    a. HgA1c 6.1 in 10/2014   Prostate cancer [facility])    s/p prostatectomy and XRT   Rheumatic fever 08/18/1945   Small cell lung cancer (HCC) 01/2022    ALLERGIES:  is allergic to penicillins, clindamycin, corn syrup [glucose], and aspirin  adult low [aspirin ].  MEDICATIONS:  Current Outpatient Medications  Medication Sig Dispense Refill   acetaminophen  (TYLENOL ) 500 MG tablet Take 500 mg by mouth every 6 (six) hours as needed for mild pain or moderate pain.     albuterol  (VENTOLIN  HFA) 108 (90 Base) MCG/ACT inhaler Inhale 1-2 puffs into the lungs every 6 (six) hours as needed. 8 g 2  ascorbic acid  (VITAMIN C ) 500 MG tablet Take 500 mg by mouth 2 (two) times daily.     Blood Pressure Monitor KIT For daily blood pressure monitoring;  Arm Cuff 1 each 0   carvedilol  (COREG ) 3.125 MG tablet TAKE 1 TABLET(3.125 MG) BY MOUTH TWICE DAILY WITH A MEAL 180 tablet 3   escitalopram (LEXAPRO) 5 MG tablet Take 5 mg by mouth daily.     PROCTO-MED HC  2.5 % rectal cream Apply topically.     triamcinolone  cream (KENALOG ) 0.1 % Apply 1 Application topically 2 (two) times daily. 80 g 0   No current facility-administered  medications for this visit.   Facility-Administered Medications Ordered in Other Visits  Medication Dose Route Frequency Provider Last Rate Last Admin   sodium chloride  flush (NS) 0.9 % injection 10 mL  10 mL Intracatheter Once Sherrod Sherrod, MD        SURGICAL HISTORY:  Past Surgical History:  Procedure Laterality Date   CORONARY ANGIOPLASTY WITH STENT PLACEMENT  10/30/14   STEMI s/p DES to proximal LAD; residual 80 % proximal and 60% mid LCx diease   IR IMAGING GUIDED PORT INSERTION  03/12/2022   IR PATIENT EVAL TECH 0-60 MINS  10/20/2022   IR PATIENT EVAL TECH 0-60 MINS  10/27/2022   IR PATIENT EVAL TECH 0-60 MINS  10/30/2022   IR PATIENT EVAL TECH 0-60 MINS  10/23/2022   IR PATIENT EVAL TECH 0-60 MINS  11/06/2022   IR PATIENT EVAL TECH 0-60 MINS  11/13/2022   IR PATIENT EVAL TECH 0-60 MINS  12/01/2022   IR RADIOLOGIST EVAL & MGMT  10/02/2022   IR RADIOLOGIST EVAL & MGMT  10/13/2022   IR RADIOLOGIST EVAL & MGMT  10/14/2022   IR RADIOLOGIST EVAL & MGMT  10/17/2022   IR RADIOLOGIST EVAL & MGMT  10/20/2022   IR REMOVAL TUN ACCESS W/ PORT W/O FL MOD SED  10/13/2022   LEFT HEART CATHETERIZATION WITH CORONARY ANGIOGRAM N/A 10/30/2014   Procedure: LEFT HEART CATHETERIZATION WITH CORONARY ANGIOGRAM;  Surgeon: Dorn JINNY Lesches, MD;  Location: [facility] CATH LAB;  Service: Cardiovascular;  Laterality: N/A;   PERCUTANEOUS CORONARY STENT INTERVENTION (PCI-S)  10/30/2014   Procedure: PERCUTANEOUS CORONARY STENT INTERVENTION (PCI-S);  Surgeon: Dorn JINNY Lesches, MD;  Location: [facility] CATH LAB;  Service: Cardiovascular;;   PROSTATECTOMY     THORACENTESIS Left 12/31/2021   Procedure: THORACENTESIS;  Surgeon: Claudene Toribio BROCKS, MD;  Location: [facility] ENDOSCOPY;  Service: Pulmonary;  Laterality: Left;   TONSILLECTOMY     VIDEO ASSISTED THORACOSCOPY (VATS)/DECORTICATION Left 01/21/2022   Procedure: VIDEO ASSISTED THORACOSCOPY (VATS)/DECORTICATION;  Surgeon: Shyrl Linnie KIDD, MD;  Location: [facility] OR;  Service: Thoracic;  Laterality:  Left;    REVIEW OF SYSTEMS:  A comprehensive review of systems was negative.   PHYSICAL EXAMINATION: General appearance: alert, cooperative, and no distress Head: Normocephalic, without obvious abnormality, atraumatic Neck: no adenopathy, no JVD, supple, symmetrical, trachea midline, and thyroid  not enlarged, symmetric, no tenderness/mass/nodules Lymph nodes: Cervical, supraclavicular, and axillary nodes normal. Resp: clear to auscultation bilaterally Back: symmetric, no curvature. ROM normal. No CVA tenderness. Cardio: regular rate and rhythm, S1, S2 normal, no murmur, click, rub or gallop GI: soft, non-tender; bowel sounds normal; no masses,  no organomegaly Extremities: extremities normal, atraumatic, no cyanosis or edema  ECOG PERFORMANCE STATUS: 1 - Symptomatic but completely ambulatory  Blood pressure 108/70, pulse (!) 54, temperature 98.2 F (36.8 C), temperature source Temporal, resp. rate 17, height 5' 9 (1.753 m), weight 147  lb (66.7 kg), SpO2 98%.  LABORATORY DATA: Lab Results  Component Value Date   WBC 7.3 02/01/2024   HGB 13.9 02/01/2024   HCT 41.6 02/01/2024   MCV 89.3 02/01/2024   PLT 171 02/01/2024      Chemistry      Component Value Date/Time   NA 141 02/01/2024 0936   K 4.3 02/01/2024 0936   CL 109 02/01/2024 0936   CO2 25 02/01/2024 0936   BUN 21 02/01/2024 0936   CREATININE 1.04 02/01/2024 0936      Component Value Date/Time   CALCIUM  9.0 02/01/2024 0936   ALKPHOS 67 02/01/2024 0936   AST 19 02/01/2024 0936   ALT 9 02/01/2024 0936   BILITOT 0.5 02/01/2024 0936       RADIOGRAPHIC STUDIES: No results found.    ASSESSMENT AND PLAN: This is a very pleasant 83 years old white male with Extensive stage (T3, N2, M1a) small cell lung cancer presented with obstructive left upper lobe lung mass in addition to mediastinal lymphadenopathy and malignant left pleural effusion as well as pleural-based metastasis and suspicious liver metastasis diagnosed  in June 2023. The patient is currently undergoing systemic chemotherapy with carboplatin  for AUC of 4 on day 1, etoposide  80 Mg/M2 on days 1, 2 and 3 with Imfinzi  240 Mg/M2 on the days before chemotherapy and Imfinzi  1500 Mg IV on day 1 every 3 weeks.  Status post 26 cycles.  Starting from cycle #5 he will be on maintenance treatment with immunotherapy with Imfinzi  1500 Mg IV every 4 weeks.  He has been tolerating this treatment fairly well. He underwent palliative radiotherapy to enlarging gastrohepatic ligament lymph node under the care of Dr. Shannon completed on August 26, 2023. Assessment and Plan    Extensive stage small cell lung cancer Diagnosed in June 2023, currently on maintenance treatment with single-agent durvalumab  (Imfinzi ) every four weeks. He has completed 28 cycles and is here for evaluation before starting cycle 29. Reports feeling good with no complaints of chest pain, breathing issues, nausea, vomiting, or diarrhea. Lab work is satisfactory. Reports occasional pain in the right eye without visual changes or blurred vision. Also reports transient pain in the left lower quadrant without associated constipation or gas. - Proceed with cycle 29 of durvalumab  (Imfinzi ) maintenance treatment today. - Monitor right eye pain and advise to report any changes in vision or persistence of pain. - Advise to monitor left lower quadrant pain and report if it persists or worsens.   The patient was advised to call immediately if he has any other concerning symptoms in the interval.  The patient voices understanding of current disease status and treatment options and is in agreement with the current care plan.  All questions were answered. The patient knows to call the clinic with any problems, questions or concerns. We can certainly see the patient much sooner if necessary.  The total time spent in the appointment was 20 minutes.  Disclaimer: This note was dictated with voice recognition  software. Similar sounding words can inadvertently be transcribed and may not be corrected upon review.

## 2024-02-29 NOTE — Patient Instructions (Signed)

## 2024-03-12 ENCOUNTER — Other Ambulatory Visit: Payer: Self-pay

## 2024-03-29 ENCOUNTER — Inpatient Hospital Stay

## 2024-03-29 ENCOUNTER — Inpatient Hospital Stay: Attending: Internal Medicine | Admitting: Internal Medicine

## 2024-03-29 ENCOUNTER — Other Ambulatory Visit: Payer: Self-pay | Admitting: Medical Oncology

## 2024-03-29 VITALS — BP 110/84 | HR 54 | Temp 98.2°F | Resp 17 | Ht 69.0 in | Wt 146.0 lb

## 2024-03-29 DIAGNOSIS — I878 Other specified disorders of veins: Secondary | ICD-10-CM

## 2024-03-29 DIAGNOSIS — C3490 Malignant neoplasm of unspecified part of unspecified bronchus or lung: Secondary | ICD-10-CM

## 2024-03-29 DIAGNOSIS — Z5112 Encounter for antineoplastic immunotherapy: Secondary | ICD-10-CM | POA: Insufficient documentation

## 2024-03-29 DIAGNOSIS — C3482 Malignant neoplasm of overlapping sites of left bronchus and lung: Secondary | ICD-10-CM

## 2024-03-29 DIAGNOSIS — C3412 Malignant neoplasm of upper lobe, left bronchus or lung: Secondary | ICD-10-CM | POA: Insufficient documentation

## 2024-03-29 DIAGNOSIS — J910 Malignant pleural effusion: Secondary | ICD-10-CM | POA: Insufficient documentation

## 2024-03-29 LAB — CMP (CANCER CENTER ONLY)
ALT: 8 U/L (ref 0–44)
AST: 18 U/L (ref 15–41)
Albumin: 3.9 g/dL (ref 3.5–5.0)
Alkaline Phosphatase: 64 U/L (ref 38–126)
Anion gap: 3 — ABNORMAL LOW (ref 5–15)
BUN: 24 mg/dL — ABNORMAL HIGH (ref 8–23)
CO2: 30 mmol/L (ref 22–32)
Calcium: 8.9 mg/dL (ref 8.9–10.3)
Chloride: 109 mmol/L (ref 98–111)
Creatinine: 0.94 mg/dL (ref 0.61–1.24)
GFR, Estimated: >60 mL/min (ref >60)
Glucose, Bld: 81 mg/dL (ref 70–99)
Potassium: 4.5 mmol/L (ref 3.5–5.1)
Sodium: 142 mmol/L (ref 135–145)
Total Bilirubin: 0.5 mg/dL (ref 0.0–1.2)
Total Protein: 6.3 g/dL — ABNORMAL LOW (ref 6.5–8.1)

## 2024-03-29 LAB — CBC WITH DIFFERENTIAL (CANCER CENTER ONLY)
Abs Immature Granulocytes: 0.01 K/uL (ref 0.00–0.07)
Basophils Absolute: 0 K/uL (ref 0.0–0.1)
Basophils Relative: 1 %
Eosinophils Absolute: 0.4 K/uL (ref 0.0–0.5)
Eosinophils Relative: 5 %
HCT: 39 % (ref 39.0–52.0)
Hemoglobin: 12.8 g/dL — ABNORMAL LOW (ref 13.0–17.0)
Immature Granulocytes: 0 %
Lymphocytes Relative: 18 %
Lymphs Abs: 1.5 K/uL (ref 0.7–4.0)
MCH: 29.8 pg (ref 26.0–34.0)
MCHC: 32.8 g/dL (ref 30.0–36.0)
MCV: 90.9 fL (ref 80.0–100.0)
Monocytes Absolute: 0.9 K/uL (ref 0.1–1.0)
Monocytes Relative: 11 %
Neutro Abs: 5.4 K/uL (ref 1.7–7.7)
Neutrophils Relative %: 65 %
Platelet Count: 173 K/uL (ref 150–400)
RBC: 4.29 MIL/uL (ref 4.22–5.81)
RDW: 13.4 % (ref 11.5–15.5)
WBC Count: 8.3 K/uL (ref 4.0–10.5)
nRBC: 0 % (ref 0.0–0.2)

## 2024-03-29 MED ORDER — SODIUM CHLORIDE 0.9 % IV SOLN: Freq: Once | INTRAVENOUS | Status: AC

## 2024-03-29 MED ORDER — SODIUM CHLORIDE 0.9 % IV SOLN
1500.0000 mg | Freq: Once | INTRAVENOUS | Status: AC
  Administered 2024-03-29 (×2): 1500 mg via INTRAVENOUS
  Filled 2024-03-29: qty 30

## 2024-03-29 MED ORDER — SODIUM CHLORIDE 0.9% FLUSH
10.0000 mL | Freq: Once | INTRAVENOUS | Status: AC
Start: 2024-03-29 — End: 2024-03-29
  Administered 2024-03-29 (×2): 10 mL

## 2024-03-29 NOTE — Progress Notes (Signed)
 [facility] [phone]   Fax:(336) [phone]  OFFICE PROGRESS NOTE  [name], MD [address] [place] [address]  DIAGNOSIS: Extensive stage (T3, N2, M1b) small cell lung cancer presented with obstructive left upper lobe lung mass in addition to mediastinal lymphadenopathy and malignant left pleural effusion as well as pleural-based metastasis as well as suspicious liver metastasis diagnosed in June 2023.  PRIOR THERAPY: None  CURRENT THERAPY:  1) Palliative systemic chemotherapy with carboplatin  initiated for AUC of 4 on day 1 and etoposide  80 Mg/M2 on days 1, 2 and 3 with Cosela  before the chemotherapy and Imfinzi  1500 Mg IV on day 1 every 3 weeks.  Status post 29 cycles.  Starting from cycle #5 the patient will be on maintenance treatment with Imfinzi  1500 Mg IV every 4 weeks.  2) palliative radiotherapy to enlarging gastrohepatic ligament lymph node under the care of Dr. Shannon expected to be completed on August 26, 2023.  INTERVAL HISTORY: Pedro Pope. 83 y.o. male returns to the clinic today for follow-up visit. Discussed the use of AI scribe software for clinical note transcription with the patient, who gave verbal consent to proceed.  History of Present Illness Pedro Pope. is an 83 year old male with extensive stage small cell lung cancer who presents for evaluation before starting cycle number thirty of maintenance treatment. He is accompanied by his wife.  He was diagnosed with extensive stage small cell lung cancer in June 2023. He has undergone palliative radiotherapy to an enlarging gastrohepatic ligament lymph node and started palliative systemic chemo-immunotherapy with carboplatin , etoposide , and Imfinzi  every three weeks for four cycles. This was followed by maintenance treatment with Imfinzi  every four weeks, and he is now status post a total of twenty-nine cycles.  He experiences occasional discomfort in the left side,  described as feeling like a lymph node, but it has not persisted. He reports no current issues with breathing. He has emphysema and experiences shortness of breath if he walks too far. No headaches, changes in vision, or double vision.  He has a frequent cough that produces yellow sputum. No other respiratory symptoms are reported.  His last MRI of the brain was in August of the previous year.     MEDICAL HISTORY: Past Medical History:  Diagnosis Date   Anosmia    CAD (coronary artery disease), native coronary artery    a. 10/29/2013 antlat STEMI s/p DES to pLAD; residual 80 % proximal and 60% mid LCx diease   Chronic headaches    Dressler's syndrome (HCC)    a. placed on colchicine     ED (erectile dysfunction)    Elevated TSH    Fatty liver    Gout    Ischemic cardiomyopathy    a. 2D ECHO 11/04/14 w/ EF 40-45% with LV WMA, G2DD, mild MR, mild LA dilation, mod TR.   PAF (paroxysmal atrial fibrillation) (HCC)    a. Dx on 11/03/14 admission. Not placed on longterm AC due to DAPT w/ receent DES. Will plan for outpt heart monitor    Pre-diabetes    a. HgA1c 6.1 in 10/2014   Prostate cancer [facility])    s/p prostatectomy and XRT   Rheumatic fever 08/18/1945   Small cell lung cancer (HCC) 01/2022    ALLERGIES:  is allergic to penicillins, clindamycin, corn syrup [glucose], and aspirin  adult low [aspirin ].  MEDICATIONS:  Current Outpatient Medications  Medication Sig Dispense Refill   acetaminophen  (TYLENOL ) 500 MG tablet  Take 500 mg by mouth every 6 (six) hours as needed for mild pain or moderate pain.     albuterol  (VENTOLIN  HFA) 108 (90 Base) MCG/ACT inhaler Inhale 1-2 puffs into the lungs every 6 (six) hours as needed. 8 g 2   ascorbic acid  (VITAMIN C ) 500 MG tablet Take 500 mg by mouth 2 (two) times daily.     Blood Pressure Monitor KIT For daily blood pressure monitoring;  Arm Cuff 1 each 0   carvedilol  (COREG ) 3.125 MG tablet TAKE 1 TABLET(3.125 MG) BY MOUTH TWICE DAILY WITH A  MEAL 180 tablet 3   escitalopram (LEXAPRO) 5 MG tablet Take 5 mg by mouth daily.     PROCTO-MED HC  2.5 % rectal cream Apply topically.     triamcinolone  cream (KENALOG ) 0.1 % Apply 1 Application topically 2 (two) times daily. 80 g 0   No current facility-administered medications for this visit.   Facility-Administered Medications Ordered in Other Visits  Medication Dose Route Frequency Provider Last Rate Last Admin   sodium chloride  flush (NS) 0.9 % injection 10 mL  10 mL Intracatheter Once Sherrod Sherrod, MD        SURGICAL HISTORY:  Past Surgical History:  Procedure Laterality Date   CORONARY ANGIOPLASTY WITH STENT PLACEMENT  10/30/14   STEMI s/p DES to proximal LAD; residual 80 % proximal and 60% mid LCx diease   IR IMAGING GUIDED PORT INSERTION  03/12/2022   IR PATIENT EVAL TECH 0-60 MINS  10/20/2022   IR PATIENT EVAL TECH 0-60 MINS  10/27/2022   IR PATIENT EVAL TECH 0-60 MINS  10/30/2022   IR PATIENT EVAL TECH 0-60 MINS  10/23/2022   IR PATIENT EVAL TECH 0-60 MINS  11/06/2022   IR PATIENT EVAL TECH 0-60 MINS  11/13/2022   IR PATIENT EVAL TECH 0-60 MINS  12/01/2022   IR RADIOLOGIST EVAL & MGMT  10/02/2022   IR RADIOLOGIST EVAL & MGMT  10/13/2022   IR RADIOLOGIST EVAL & MGMT  10/14/2022   IR RADIOLOGIST EVAL & MGMT  10/17/2022   IR RADIOLOGIST EVAL & MGMT  10/20/2022   IR REMOVAL TUN ACCESS W/ PORT W/O FL MOD SED  10/13/2022   LEFT HEART CATHETERIZATION WITH CORONARY ANGIOGRAM N/A 10/30/2014   Procedure: LEFT HEART CATHETERIZATION WITH CORONARY ANGIOGRAM;  Surgeon: Dorn JINNY Lesches, MD;  Location[facility] CATH LAB;  Service: Cardiovascular;  Laterality: N/A;   PERCUTANEOUS CORONARY STENT INTERVENTION (PCI-S)  10/30/2014   Procedure: PERCUTANEOUS CORONARY STENT INTERVENTION (PCI-S);  Surgeon: Dorn JINNY Lesches, MD;  Location: [facility] CATH LAB;  Service: Cardiovascular;;   PROSTATECTOMY     THORACENTESIS Left 12/31/2021   Procedure: THORACENTESIS;  Surgeon: Claudene Toribio BROCKS, MD;  Location: [facility] ENDOSCOPY;   Service: Pulmonary;  Laterality: Left;   TONSILLECTOMY     VIDEO ASSISTED THORACOSCOPY (VATS)/DECORTICATION Left 01/21/2022   Procedure: VIDEO ASSISTED THORACOSCOPY (VATS)/DECORTICATION;  Surgeon: Shyrl Linnie KIDD, MD;  Location: [facility] OR;  Service: Thoracic;  Laterality: Left;    REVIEW OF SYSTEMS:  A comprehensive review of systems was negative except for: Respiratory: positive for cough   PHYSICAL EXAMINATION: General appearance: alert, cooperative, and no distress Head: Normocephalic, without obvious abnormality, atraumatic Neck: no adenopathy, no JVD, supple, symmetrical, trachea midline, and thyroid  not enlarged, symmetric, no tenderness/mass/nodules Lymph nodes: Cervical, supraclavicular, and axillary nodes normal. Resp: clear to auscultation bilaterally Back: symmetric, no curvature. ROM normal. No CVA tenderness. Cardio: regular rate and rhythm, S1, S2 normal, no murmur, click, rub or gallop GI: soft,  non-tender; bowel sounds normal; no masses,  no organomegaly Extremities: extremities normal, atraumatic, no cyanosis or edema  ECOG PERFORMANCE STATUS: 1 - Symptomatic but completely ambulatory  Blood pressure 110/84, pulse (!) 54, temperature 98.2 F (36.8 C), temperature source Temporal, resp. rate 17, height 5' 9 (1.753 m), weight 146 lb (66.2 kg), SpO2 97%.  LABORATORY DATA: Lab Results  Component Value Date   WBC 8.3 03/29/2024   HGB 12.8 (L) 03/29/2024   HCT 39.0 03/29/2024   MCV 90.9 03/29/2024   PLT 173 03/29/2024      Chemistry      Component Value Date/Time   NA 141 02/29/2024 0952   K 4.2 02/29/2024 0952   CL 109 02/29/2024 0952   CO2 28 02/29/2024 0952   BUN 24 (H) 02/29/2024 0952   CREATININE 0.97 02/29/2024 0952      Component Value Date/Time   CALCIUM  8.6 (L) 02/29/2024 0952   ALKPHOS 61 02/29/2024 0952   AST 17 02/29/2024 0952   ALT 9 02/29/2024 0952   BILITOT 0.5 02/29/2024 0952       RADIOGRAPHIC STUDIES: No results  found.    ASSESSMENT AND PLAN: This is a very pleasant 83 years old white male with Extensive stage (T3, N2, M1a) small cell lung cancer presented with obstructive left upper lobe lung mass in addition to mediastinal lymphadenopathy and malignant left pleural effusion as well as pleural-based metastasis and suspicious liver metastasis diagnosed in June 2023. The patient is currently undergoing systemic chemotherapy with carboplatin  for AUC of 4 on day 1, etoposide  80 Mg/M2 on days 1, 2 and 3 with Imfinzi  240 Mg/M2 on the days before chemotherapy and Imfinzi  1500 Mg IV on day 1 every 3 weeks.  Status post 29 cycles.  Starting from cycle #5 he will be on maintenance treatment with immunotherapy with Imfinzi  1500 Mg IV every 4 weeks.  He has been tolerating this treatment fairly well. He underwent palliative radiotherapy to enlarging gastrohepatic ligament lymph node under the care of Dr. Shannon completed on August 26, 2023. Assessment and Plan Assessment & Plan Extensive stage small cell lung cancer, status post palliative chemoimmunotherapy and radiotherapy Extensive stage small cell lung cancer, initially diagnosed in June 2023. Currently undergoing palliative systemic chemoimmunotherapy with carboplatin , etoposide , and Imfinzi . Completed 29 cycles and is here for evaluation before starting cycle 30. No new symptoms reported. Discussed the importance of regular brain MRI due to the propensity of small cell lung cancer to metastasize to the brain. - Order chest, abdomen, and pelvis CT scan in three weeks. - Order MRI of the brain to monitor for metastasis.  Emphysema Emphysema with exertional dyspnea. Breathing is well-managed, but exertion leads to increased symptoms.  Chronic cough with sputum production Chronic cough with yellow sputum production, likely related to underlying lung conditions. No acute changes reported. He was advised to call immediately if he has any concerning symptoms in the  interval.  The patient voices understanding of current disease status and treatment options and is in agreement with the current care plan.  All questions were answered. The patient knows to call the clinic with any problems, questions or concerns. We can certainly see the patient much sooner if necessary.  The total time spent in the appointment was 20 minutes.  Disclaimer: This note was dictated with voice recognition software. Similar sounding words can inadvertently be transcribed and may not be corrected upon review.

## 2024-03-29 NOTE — Patient Instructions (Signed)
 [facility] - A DEPT OF [place]  [facility]  Discharge Instructions: Thank you for choosing [facility] to provide your oncology and hematology care.   If you have a lab appointment with the Cancer Center, please go directly to the Cancer Center and check in at the registration area.   Wear comfortable clothing and clothing appropriate for easy access to any Portacath or PICC line.   We strive to give you quality time with your provider. You may need to reschedule your appointment if you arrive late (15 or more minutes).  Arriving late affects you and other patients whose appointments are after yours.  Also, if you miss three or more appointments without notifying the office, you may be dismissed from the clinic at the provider's discretion.      For prescription refill requests, have your pharmacy contact our office and allow 72 hours for refills to be completed.    Today you received the following chemotherapy and/or immunotherapy agents imfinzi       To help prevent nausea and vomiting after your treatment, we encourage you to take your nausea medication as directed.  BELOW ARE SYMPTOMS THAT SHOULD BE REPORTED IMMEDIATELY: *FEVER GREATER THAN 100.4 F (38 C) OR HIGHER *CHILLS OR SWEATING *NAUSEA AND VOMITING THAT IS NOT CONTROLLED WITH YOUR NAUSEA MEDICATION *UNUSUAL SHORTNESS OF BREATH *UNUSUAL BRUISING OR BLEEDING *URINARY PROBLEMS (pain or burning when urinating, or frequent urination) *BOWEL PROBLEMS (unusual diarrhea, constipation, pain near the anus) TENDERNESS IN MOUTH AND THROAT WITH OR WITHOUT PRESENCE OF ULCERS (sore throat, sores in mouth, or a toothache) UNUSUAL RASH, SWELLING OR PAIN  UNUSUAL VAGINAL DISCHARGE OR ITCHING   Items with * indicate a potential emergency and should be followed up as soon as possible or go to the Emergency Department if any problems should occur.  Please show the CHEMOTHERAPY ALERT CARD or IMMUNOTHERAPY  ALERT CARD at check-in to the Emergency Department and triage nurse.  Should you have questions after your visit or need to cancel or reschedule your appointment, please contact [facility] - A DEPT [facility]  Dept: [phone]  and follow the prompts.  Office hours are 8:00 a.m. to 4:30 p.m. Monday - Friday. Please note that voicemails left after 4:00 p.m. may not be returned until the following business day.  We are closed weekends and major holidays. You have access to a nurse at all times for urgent questions. Please call the main number to the clinic Dept: [phone] and follow the prompts.   For any non-urgent questions, you may also contact your provider using MyChart. We now offer e-Visits for anyone 34 and older to request care online for non-urgent symptoms. For details visit [email or web address].   Also download the MyChart app! Go to the app store, search MyChart, open the app, select [facility], and log in with your MyChart username and password.

## 2024-03-29 NOTE — Progress Notes (Signed)
 Patient would like a port. He understands about getting an infection in his first port, but care team has to call IV team with every stick and he would like to try for another one.

## 2024-03-29 NOTE — Progress Notes (Signed)
 Patient does NOT have PORT ACCESS. Lab apt only for blood work.  This concludes the interaction.  Rosaline Minerva, LPN

## 2024-03-31 ENCOUNTER — Other Ambulatory Visit: Payer: Self-pay

## 2024-04-15 ENCOUNTER — Other Ambulatory Visit: Payer: Self-pay | Admitting: Radiology

## 2024-04-15 NOTE — H&P (Signed)
 Chief Complaint: Small cell left lung cancer, poor venous access; referred for Port-A-Cath placement to assist with treatment  Referring Provider(s): Mohamed,M  Supervising Physician: Luverne Aran  Patient Status: [facility] - Out-pt  History of Present Illness: Pedro Pope. is an 83 y.o. male with past medical history significant for coronary artery disease with prior STEMI/stenting, chronic headaches, Dressler's syndrome, fatty liver, gout, nephrolithiasis, COPD, ischemic cardiomyopathy, paroxysmal atrial fibrillation, prediabetes, prostate cancer with prior prostatectomy and extensive stage small cell left lung cancer.  He has a history of an obstructive left upper lobe lung mass in addition to mediastinal lymphadenopathy and malignant left pleural effusion as well as pleural-based metastasis and suspicious liver mets which were diagnosed in 2023.  He has poor venous access and is scheduled today for Port-A-Cath placement to assist with palliative treatment.  He has a history of right chest wall Port-A-Cath placement by IR in 2023 which was removed in 2024 secondary to skin erosion.   Patient is Full Code  Past Medical History:  Diagnosis Date   Anosmia    CAD (coronary artery disease), native coronary artery    a. 10/29/2013 antlat STEMI s/p DES to pLAD; residual 80 % proximal and 60% mid LCx diease   Chronic headaches    Dressler's syndrome (HCC)    a. placed on colchicine     ED (erectile dysfunction)    Elevated TSH    Fatty liver    Gout    Ischemic cardiomyopathy    a. 2D ECHO 11/04/14 w/ EF 40-45% with LV WMA, G2DD, mild MR, mild LA dilation, mod TR.   PAF (paroxysmal atrial fibrillation) (HCC)    a. Dx on 11/03/14 admission. Not placed on longterm AC due to DAPT w/ receent DES. Will plan for outpt heart monitor    Pre-diabetes    a. HgA1c 6.1 in 10/2014   Prostate cancer [facility])    s/p prostatectomy and XRT   Rheumatic fever 08/18/1945   Small cell lung cancer (HCC)  01/2022    Past Surgical History:  Procedure Laterality Date   CORONARY ANGIOPLASTY WITH STENT PLACEMENT  10/30/14   STEMI s/p DES to proximal LAD; residual 80 % proximal and 60% mid LCx diease   IR IMAGING GUIDED PORT INSERTION  03/12/2022   IR PATIENT EVAL TECH 0-60 MINS  10/20/2022   IR PATIENT EVAL TECH 0-60 MINS  10/27/2022   IR PATIENT EVAL TECH 0-60 MINS  10/30/2022   IR PATIENT EVAL TECH 0-60 MINS  10/23/2022   IR PATIENT EVAL TECH 0-60 MINS  11/06/2022   IR PATIENT EVAL TECH 0-60 MINS  11/13/2022   IR PATIENT EVAL TECH 0-60 MINS  12/01/2022   IR RADIOLOGIST EVAL & MGMT  10/02/2022   IR RADIOLOGIST EVAL & MGMT  10/13/2022   IR RADIOLOGIST EVAL & MGMT  10/14/2022   IR RADIOLOGIST EVAL & MGMT  10/17/2022   IR RADIOLOGIST EVAL & MGMT  10/20/2022   IR REMOVAL TUN ACCESS W/ PORT W/O FL MOD SED  10/13/2022   LEFT HEART CATHETERIZATION WITH CORONARY ANGIOGRAM N/A 10/30/2014   Procedure: LEFT HEART CATHETERIZATION WITH CORONARY ANGIOGRAM;  Surgeon: Dorn JINNY Lesches, MD;  Location: [facility] CATH LAB;  Service: Cardiovascular;  Laterality: N/A;   PERCUTANEOUS CORONARY STENT INTERVENTION (PCI-S)  10/30/2014   Procedure: PERCUTANEOUS CORONARY STENT INTERVENTION (PCI-S);  Surgeon: Dorn JINNY Lesches, MD;  Location: [facility] CATH LAB;  Service: Cardiovascular;;   PROSTATECTOMY     THORACENTESIS Left 12/31/2021   Procedure:  THORACENTESIS;  Surgeon: Claudene Toribio BROCKS, MD;  Location: [facility] ENDOSCOPY;  Service: Pulmonary;  Laterality: Left;   TONSILLECTOMY     VIDEO ASSISTED THORACOSCOPY (VATS)/DECORTICATION Left 01/21/2022   Procedure: VIDEO ASSISTED THORACOSCOPY (VATS)/DECORTICATION;  Surgeon: Shyrl Linnie KIDD, MD;  Location: [facility] OR;  Service: Thoracic;  Laterality: Left;    Allergies: Penicillins, Clindamycin, Corn syrup [glucose], and Aspirin  adult low [aspirin ]  Medications: Prior to Admission medications   Medication Sig Start Date End Date Taking? Authorizing Provider  acetaminophen  (TYLENOL ) 500 MG tablet Take 500 mg  by mouth every 6 (six) hours as needed for mild pain or moderate pain.    [provider]  albuterol  (VENTOLIN  HFA) 108 (90 Base) MCG/ACT inhaler Inhale 1-2 puffs into the lungs every 6 (six) hours as needed. 01/07/24   Parrett, Madelin RAMAN, NP  ascorbic acid  (VITAMIN C ) 500 MG tablet Take 500 mg by mouth 2 (two) times daily.    [provider]  Blood Pressure Monitor KIT For daily blood pressure monitoring;  Arm Cuff 06/05/15   Court Dorn PARAS, MD  carvedilol  (COREG ) 3.125 MG tablet TAKE 1 TABLET(3.125 MG) BY MOUTH TWICE DAILY WITH A MEAL 08/07/23   Court Dorn PARAS, MD  escitalopram (LEXAPRO) 5 MG tablet Take 5 mg by mouth daily.    [provider]  PROCTO-MED HC  2.5 % rectal cream Apply topically. 05/19/22   [provider]  triamcinolone  cream (KENALOG ) 0.1 % Apply 1 Application topically 2 (two) times daily. 08/19/22   Heilingoetter, Cassandra L, PA-C     Family History  Problem Relation Age of Onset   Cancer Mother    Colon cancer Father    Coronary artery disease Brother 10       s/p CABG   Cancer Other    Diabetes Other    Hyperlipidemia Other    Hypertension Other    Gout Other     Social History   Socioeconomic History   Marital status: Married    Spouse name: Not on file   Number of children: Not on file   Years of education: Not on file   Highest education level: Not on file  Occupational History   Not on file  Tobacco Use   Smoking status: Former    Current packs/day: 0.00    Types: Cigarettes    Quit date: 08/18/1992    Years since quitting: 31.6   Smokeless tobacco: Never  Vaping Use   Vaping status: Not on file  Substance and Sexual Activity   Alcohol  use: Not Currently    Comment: social   Drug use: No   Sexual activity: Not Currently  Other Topics Concern   Not on file  Social History Narrative   Pt lives in [place] with spouse.  Realtor.  Retired Hydrographic surveyor   Social Drivers of Corporate investment banker Strain:  Not on [employer] Insecurity: No Food Insecurity (07/27/2023)   Hunger Vital Sign    Worried About Running Out of Food in the Last Year: Never true    Ran Out of Food in the Last Year: Never true  Transportation Needs: No Transportation Needs (07/27/2023)   PRAPARE - Administrator, Civil Service (Medical): No    Lack of Transportation (Non-Medical): No  Physical Activity: Not on file  Stress: Not on file  Social Connections: Not on file       Review of Systems: currently denies fever,HA,CP,abd/back pain,N/V ; he does have some dyspnea with  exertion/occ cough, occ rectal bleeding  Vital Signs:pending    Advance Care Plan: No documents on file.  Physical Exam: awake/alert; chest- few insp wheezes, distant BS bilat; heart- RRR; abd-soft,+BS,NT; 1+ pretibial edema bilat  Imaging: No results found.  Labs:  CBC: Recent Labs    01/04/24 0935 02/01/24 0936 02/29/24 0952 03/29/24 1027  WBC 7.8 7.3 6.5 8.3  HGB 13.2 13.9 13.1 12.8*  HCT 39.5 41.6 39.3 39.0  PLT 151 171 150 173    COAGS: No results for input(s): INR, APTT in the last 8760 hours.  BMP: Recent Labs    01/04/24 0935 02/01/24 0936 02/29/24 0952 03/29/24 1027  NA 141 141 141 142  K 4.2 4.3 4.2 4.5  CL 108 109 109 109  CO2 26 25 28 30   GLUCOSE 107* 103* 93 81  BUN 23 21 24* 24*  CALCIUM  9.0 9.0 8.6* 8.9  CREATININE 0.97 1.04 0.97 0.94  GFRNONAA >60 >60 >60 >60    LIVER FUNCTION TESTS: Recent Labs    01/04/24 0935 02/01/24 0936 02/29/24 0952 03/29/24 1027  BILITOT 0.3 0.5 0.5 0.5  AST 20 19 17 18   ALT 10 9 9 8   ALKPHOS 61 67 61 64  PROT 6.4* 6.8 6.1* 6.3*  ALBUMIN  3.9 4.0 3.7 3.9    TUMOR MARKERS: No results for input(s): AFPTM, CEA, CA199, CHROMGRNA in the last 8760 hours.  Assessment and Plan: 83 y.o. male with past medical history significant for coronary artery disease with prior STEMI/stenting, chronic headaches, Dressler's syndrome, fatty liver, gout,  nephrolithiasis, COPD, ischemic cardiomyopathy, paroxysmal atrial fibrillation, prediabetes, prostate cancer with prior prostatectomy and extensive stage small cell left lung cancer.  He has a history of an obstructive left upper lobe lung mass in addition to mediastinal lymphadenopathy and malignant left pleural effusion as well as pleural-based metastasis and suspicious liver mets which were diagnosed in 2023.  He has poor venous access and is scheduled today for Port-A-Cath placement to assist with palliative treatment.  He has a history of right chest wall Port-A-Cath placement by IR in 2023 which was removed in 2024 secondary to skin erosion.Risks and benefits of image guided port-a-catheter placement was discussed with the patient including, but not limited to bleeding, infection, pneumothorax, or fibrin sheath development and need for additional procedures.  All of the patient's questions were answered, patient is agreeable to proceed. Consent signed and in chart.    Thank you for allowing our service to participate in [place]. 's care.  Electronically Signed: D. Franky Rakers, PA-C   04/15/2024, 5:14 PM      I spent a total of  20 minutes   in face to face in clinical consultation, greater than 50% of which was counseling/coordinating care for port a cath placement

## 2024-04-19 ENCOUNTER — Other Ambulatory Visit: Payer: Self-pay

## 2024-04-19 ENCOUNTER — Ambulatory Visit (HOSPITAL_COMMUNITY)
Admission: RE | Admit: 2024-04-19 | Discharge: 2024-04-19 | Disposition: A | Discharge status: Home / Self Care | Source: Ambulatory Visit | Attending: Internal Medicine | Admitting: Internal Medicine

## 2024-04-19 ENCOUNTER — Encounter (HOSPITAL_COMMUNITY): Payer: Self-pay

## 2024-04-19 DIAGNOSIS — I255 Ischemic cardiomyopathy: Secondary | ICD-10-CM | POA: Insufficient documentation

## 2024-04-19 DIAGNOSIS — N200 Calculus of kidney: Secondary | ICD-10-CM | POA: Diagnosis not present

## 2024-04-19 DIAGNOSIS — J449 Chronic obstructive pulmonary disease, unspecified: Secondary | ICD-10-CM | POA: Insufficient documentation

## 2024-04-19 DIAGNOSIS — J321 Chronic frontal sinusitis: Secondary | ICD-10-CM | POA: Insufficient documentation

## 2024-04-19 DIAGNOSIS — I878 Other specified disorders of veins: Secondary | ICD-10-CM | POA: Insufficient documentation

## 2024-04-19 DIAGNOSIS — N3289 Other specified disorders of bladder: Secondary | ICD-10-CM | POA: Diagnosis not present

## 2024-04-19 DIAGNOSIS — J439 Emphysema, unspecified: Secondary | ICD-10-CM | POA: Diagnosis not present

## 2024-04-19 DIAGNOSIS — Z8546 Personal history of malignant neoplasm of prostate: Secondary | ICD-10-CM | POA: Insufficient documentation

## 2024-04-19 DIAGNOSIS — I252 Old myocardial infarction: Secondary | ICD-10-CM | POA: Diagnosis not present

## 2024-04-19 DIAGNOSIS — C3490 Malignant neoplasm of unspecified part of unspecified bronchus or lung: Secondary | ICD-10-CM | POA: Insufficient documentation

## 2024-04-19 DIAGNOSIS — G319 Degenerative disease of nervous system, unspecified: Secondary | ICD-10-CM | POA: Diagnosis not present

## 2024-04-19 DIAGNOSIS — I700 Atherosclerosis of aorta: Secondary | ICD-10-CM | POA: Insufficient documentation

## 2024-04-19 DIAGNOSIS — I2510 Atherosclerotic heart disease of native coronary artery without angina pectoris: Secondary | ICD-10-CM | POA: Diagnosis not present

## 2024-04-19 DIAGNOSIS — I480 Paroxysmal atrial fibrillation: Secondary | ICD-10-CM | POA: Insufficient documentation

## 2024-04-19 DIAGNOSIS — K760 Fatty (change of) liver, not elsewhere classified: Secondary | ICD-10-CM | POA: Insufficient documentation

## 2024-04-19 HISTORY — PX: IR IMAGING GUIDED PORT INSERTION: IMG5740

## 2024-04-19 MED ORDER — IOHEXOL 300 MG/ML  SOLN
100.0000 mL | Freq: Once | INTRAMUSCULAR | Status: AC | PRN
  Administered 2024-04-19: 100 mL via INTRAVENOUS

## 2024-04-19 MED ORDER — MIDAZOLAM HCL 2 MG/2ML IJ SOLN
INTRAMUSCULAR | Status: AC
  Filled 2024-04-19: qty 2

## 2024-04-19 MED ORDER — GADOBUTROL 1 MMOL/ML IV SOLN
6.0000 mL | Freq: Once | INTRAVENOUS | Status: AC | PRN
  Administered 2024-04-19: 6 mL via INTRAVENOUS

## 2024-04-19 MED ORDER — SODIUM CHLORIDE 0.9 % IV SOLN: INTRAVENOUS | Status: DC

## 2024-04-19 MED ORDER — HEPARIN SOD (PORK) LOCK FLUSH 100 UNIT/ML IV SOLN: 500.0000 [IU] | Freq: Once | INTRAVENOUS | Status: DC

## 2024-04-19 MED ORDER — FENTANYL CITRATE (PF) 100 MCG/2ML IJ SOLN
INTRAMUSCULAR | Status: AC
Start: 2024-04-19 — End: 2024-04-19
  Filled 2024-04-19: qty 2

## 2024-04-19 MED ORDER — LIDOCAINE HCL 1 % IJ SOLN
20.0000 mL | Freq: Once | INTRAMUSCULAR | Status: AC
  Administered 2024-04-19: 20 mL via INTRADERMAL

## 2024-04-19 MED ORDER — MIDAZOLAM HCL 2 MG/2ML IJ SOLN
INTRAMUSCULAR | Status: AC | PRN
  Administered 2024-04-19: 1 mg via INTRAVENOUS

## 2024-04-19 MED ORDER — FENTANYL CITRATE (PF) 100 MCG/2ML IJ SOLN
INTRAMUSCULAR | Status: AC | PRN
  Administered 2024-04-19: 50 ug via INTRAVENOUS

## 2024-04-19 MED ORDER — HEPARIN SOD (PORK) LOCK FLUSH 100 UNIT/ML IV SOLN
INTRAVENOUS | Status: AC
  Filled 2024-04-19: qty 5

## 2024-04-19 MED ORDER — LIDOCAINE HCL 1 % IJ SOLN
INTRAMUSCULAR | Status: AC
  Filled 2024-04-19: qty 20

## 2024-04-19 NOTE — Discharge Instructions (Signed)
Discharge Instructions:   Please call Interventional Radiology clinic [phone] with any questions or concerns.  You may remove your dressing and shower tomorrow.  Do not use EMLA / Lidocaine cream for 2 weeks post Port Insertion this will remove the surgical glue.  Moderate Conscious Sedation, Adult, Care After This sheet gives you information about how to care for yourself after your procedure. Your health care provider may also give you more specific instructions. If you have problems or questions, contact your health care provider. What can I expect after the procedure? After the procedure, it is common to have: Sleepiness for several hours. Impaired judgment for several hours. Difficulty with balance. Vomiting if you eat too soon. Follow these instructions at home: For the time period you were told by your health care provider: Rest. Do not participate in activities where you could fall or become injured. Do not drive or use machinery. Do not drink alcohol. Do not take sleeping pills or medicines that cause drowsiness. Do not make important decisions or sign legal documents. Do not take care of children on your own. Eating and drinking  Follow the diet recommended by your health care provider. Drink enough fluid to keep your urine pale yellow. If you vomit: Drink water, juice, or soup when you can drink without vomiting. Make sure you have little or no nausea before eating solid foods. General instructions Take over-the-counter and prescription medicines only as told by your health care provider. Have a responsible adult stay with you for the time you are told. It is important to have someone help care for you until you are awake and alert. Do not smoke. Keep all follow-up visits as told by your health care provider. This is important. Contact a health care provider if: You are still sleepy or having trouble with balance after 24 hours. You feel light-headed. You keep  feeling nauseous or you keep vomiting. You develop a rash. You have a fever. You have redness or swelling around the IV site. Get help right away if: You have trouble breathing. You have new-onset confusion at home. Summary After the procedure, it is common to feel sleepy, have impaired judgment, or feel nauseous if you eat too soon. Rest after you get home. Know the things you should not do after the procedure. Follow the diet recommended by your health care provider and drink enough fluid to keep your urine pale yellow. Get help right away if you have trouble breathing or new-onset confusion at home. This information is not intended to replace advice given to you by your health care provider. Make sure you discuss any questions you have with your health care provider. Document Revised: 12/02/2019 Document Reviewed: 06/30/2019 Elsevier Patient Education  2023 Elsevier Inc.  Implanted Port Insertion, Care After The following information offers guidance on how to care for yourself after your procedure. Your health care provider may also give you more specific instructions. If you have problems or questions, contact your health care provider. What can I expect after the procedure? After the procedure, it is common to have: Discomfort at the port insertion site. Bruising on the skin over the port. This should improve over 3-4 days. Follow these instructions at home: Port care After your port is placed, you will get a manufacturer's information card. The card has information about your port. Keep this card with you at all times. Take care of the port as told by your health care provider. Ask your health care provider if you or   a family member can get training for taking care of the port at home. A home health care nurse will be be available to help care for the port. Make sure to remember what type of port you have. Incision care     Follow instructions from your health care provider  about how to take care of your port insertion site. Make sure you: Wash your hands with soap and water for at least 20 seconds before and after you change your bandage (dressing). If soap and water are not available, use hand sanitizer. Change your dressing as told by your health care provider. Leave stitches (sutures), skin glue, or adhesive strips in place. These skin closures may need to stay in place for 2 weeks or longer. If adhesive strip edges start to loosen and curl up, you may trim the loose edges. Do not remove adhesive strips completely unless your health care provider tells you to do that. Check your port insertion site every day for signs of infection. Check for: Redness, swelling, or pain. Fluid or blood. Warmth. Pus or a bad smell. Activity Return to your normal activities as told by your health care provider. Ask your health care provider what activities are safe for you. You may have to avoid lifting. Ask your health care provider how much you can safely lift. General instructions Take over-the-counter and prescription medicines only as told by your health care provider. Do not take baths, swim, or use a hot tub until your health care provider approves. Ask your health care provider if you may take showers. You may only be allowed to take sponge baths. If you were given a sedative during the procedure, it can affect you for several hours. Do not drive or operate machinery until your health care provider says that it is safe. Wear a medical alert bracelet in case of an emergency. This will tell any health care providers that you have a port. Keep all follow-up visits. This is important. Contact a health care provider if: You cannot flush your port with saline as directed, or you cannot draw blood from the port. You have a fever or chills. You have redness, swelling, or pain around your port insertion site. You have fluid or blood coming from your port insertion site. Your port  insertion site feels warm to the touch. You have pus or a bad smell coming from the port insertion site. Get help right away if: You have chest pain or shortness of breath. You have bleeding from your port that you cannot control. These symptoms may be an emergency. Get help right away. Call 911. Do not wait to see if the symptoms will go away. Do not drive yourself to the hospital. Summary Take care of the port as told by your health care provider. Keep the manufacturer's information card with you at all times. Change your dressing as told by your health care provider. Contact a health care provider if you have a fever or chills or if you have redness, swelling, or pain around your port insertion site. Keep all follow-up visits. This information is not intended to replace advice given to you by your health care provider. Make sure you discuss any questions you have with your health care provider. Document Revised: 02/05/2021 Document Reviewed: 02/05/2021 Elsevier Patient Education  2023 Elsevier Inc.  

## 2024-04-19 NOTE — Progress Notes (Signed)
 1738 Ice bag given to use as needed to right neck and upper right chest for comfort as instructed.

## 2024-04-19 NOTE — Progress Notes (Signed)
 1738 To CT by stretcher by CT staff and CT staff to call MRI for the MRI procedure.  Vital signs stable, skin warm and dry.  Dressing to right neck and upper right chest.  Family to Radiology waiting area.

## 2024-04-19 NOTE — Procedures (Signed)
 Interventional Radiology Procedure Note  Procedure: Single Lumen Power Port Placement    Access:  Right IJ vein.  Findings: Catheter tip positioned at SVC/RA junction. Port is ready for immediate use.   Complications: None  EBL: < 10 mL  Recommendations:  - Ok to shower in 24 hours - Do not submerge for 7 days - Routine line care   Maxi Carreras T. Fredia Sorrow, M.D Pager:  [phone]

## 2024-04-20 NOTE — Progress Notes (Signed)
 [facility] OFFICE PROGRESS NOTE  [name], MD [address] [place] [address]  DIAGNOSIS: Extensive stage (T3, N2, M1b) small cell lung cancer presented with obstructive left upper lobe lung mass in addition to mediastinal lymphadenopathy and malignant left pleural effusion as well as pleural-based metastasis as well as suspicious liver metastasis diagnosed in June 2023   PRIOR THERAPY: Palliative radiotherapy to enlarging gastrohepatic ligament lymph node under the care of Dr. Shannon completed January 8th, 2025.   CURRENT THERAPY:  Palliative systemic chemotherapy with carboplatin  initiated for AUC of 4 on day 1 and etoposide  80 Mg/M2 on days 1, 2 and 3 with Cosela  before the chemotherapy and Imfinzi  1500 Mg IV on day 1 every 3 weeks.  Status post 30 cycles.  Starting from cycle #5 the patient will be on maintenance treatment with Imfinzi  1500 Mg IV every 4 weeks.   INTERVAL HISTORY: Pedro Pope. 83 y.o. male returns to the clinic today for a follow-up visit accompanied by his wife. The patient was last seen by Dr. Sherrod 4 weeks ago.    He had a port-a-cath placed again on 04/19/24. Dr. Sherrod also arranged for brain MRI for surveillance.   When his port was accessed today, he mentions some itching which has since subsided. Denies known allergies to adhesive or cleaning agents.   He denies any fever, chills, or night sweats. He had a hot flash while sitting on the couch recently.  There are no recent infections, including upper respiratory or skin infections, and no dysuria.   He has a history of emphysema, causing shortness of breath, particularly with exertion. He coughs up mucus but has not had any recent lung infections. He uses a homemade remedy of honey, sea salt, and lemon juice to help with the cough.  He has a rash on his forehead and neck that waxes and wanes, sometimes becoming red and itchy. He does not use any specific creams for it but finds a  'calming cream' effective.  No nausea, vomiting, diarrhea, constipation, or significant changes in appetite.  He recently had a restaging CT scan performed.  He is here today for evaluation and repeat blood work before undergoing cycle #31  MEDICAL HISTORY: Past Medical History:  Diagnosis Date   Anosmia    CAD (coronary artery disease), native coronary artery    a. 10/29/2013 antlat STEMI s/p DES to pLAD; residual 80 % proximal and 60% mid LCx diease   Chronic headaches    Dressler's syndrome (HCC)    a. placed on colchicine     ED (erectile dysfunction)    Elevated TSH    Fatty liver    Gout    Ischemic cardiomyopathy    a. 2D ECHO 11/04/14 w/ EF 40-45% with LV WMA, G2DD, mild MR, mild LA dilation, mod TR.   PAF (paroxysmal atrial fibrillation) (HCC)    a. Dx on 11/03/14 admission. Not placed on longterm AC due to DAPT w/ receent DES. Will plan for outpt heart monitor    Pre-diabetes    a. HgA1c 6.1 in 10/2014   Prostate cancer [facility])    s/p prostatectomy and XRT   Rheumatic fever 08[birth year]18[birth year]1945   Small cell lung cancer (HCC) 01/2022    ALLERGIES:  is allergic to penicillins, clindamycin, corn syrup [glucose], and aspirin  adult low [aspirin ].  MEDICATIONS:  Current Outpatient Medications  Medication Sig Dispense Refill   acetaminophen  (TYLENOL ) 500 MG tablet Take 500 mg by mouth every 6 (six) hours as  needed for mild pain or moderate pain.     albuterol  (VENTOLIN  HFA) 108 (90 Base) MCG/ACT inhaler Inhale 1-2 puffs into the lungs every 6 (six) hours as needed. 8 g 2   ascorbic acid  (VITAMIN C ) 500 MG tablet Take 500 mg by mouth 2 (two) times daily.     Blood Pressure Monitor KIT For daily blood pressure monitoring;  Arm Cuff 1 each 0   carvedilol  (COREG ) 3.125 MG tablet TAKE 1 TABLET(3.125 MG) BY MOUTH TWICE DAILY WITH A MEAL 180 tablet 3   escitalopram (LEXAPRO) 5 MG tablet Take 5 mg by mouth daily.     PROCTO-MED HC  2.5 % rectal cream Apply topically.     triamcinolone  cream  (KENALOG ) 0.1 % Apply 1 Application topically 2 (two) times daily. 80 g 0   No current facility-administered medications for this visit.   Facility-Administered Medications Ordered in Other Visits  Medication Dose Route Frequency Provider Last Rate Last Admin   sodium chloride  flush (NS) 0.9 % injection 10 mL  10 mL Intracatheter Once Sherrod Sherrod, MD        SURGICAL HISTORY:  Past Surgical History:  Procedure Laterality Date   CORONARY ANGIOPLASTY WITH STENT PLACEMENT  10/30/14   STEMI s/p DES to proximal LAD; residual 80 % proximal and 60% mid LCx diease   IR IMAGING GUIDED PORT INSERTION  03/12/2022   IR IMAGING GUIDED PORT INSERTION  04/19/2024   IR PATIENT EVAL TECH 0-60 MINS  10/20/2022   IR PATIENT EVAL TECH 0-60 MINS  10/27/2022   IR PATIENT EVAL TECH 0-60 MINS  10/30/2022   IR PATIENT EVAL TECH 0-60 MINS  10/23/2022   IR PATIENT EVAL TECH 0-60 MINS  11/06/2022   IR PATIENT EVAL TECH 0-60 MINS  11/13/2022   IR PATIENT EVAL TECH 0-60 MINS  12/01/2022   IR RADIOLOGIST EVAL & MGMT  10/02/2022   IR RADIOLOGIST EVAL & MGMT  10/13/2022   IR RADIOLOGIST EVAL & MGMT  10/14/2022   IR RADIOLOGIST EVAL & MGMT  10/17/2022   IR RADIOLOGIST EVAL & MGMT  10/20/2022   IR REMOVAL TUN ACCESS W/ PORT W/O FL MOD SED  10/13/2022   LEFT HEART CATHETERIZATION WITH CORONARY ANGIOGRAM N/A 10/30/2014   Procedure: LEFT HEART CATHETERIZATION WITH CORONARY ANGIOGRAM;  Surgeon: Dorn JINNY Lesches, MD;  Location: [facility] CATH LAB;  Service: Cardiovascular;  Laterality: N/A;   PERCUTANEOUS CORONARY STENT INTERVENTION (PCI-S)  10/30/2014   Procedure: PERCUTANEOUS CORONARY STENT INTERVENTION (PCI-S);  Surgeon: Dorn JINNY Lesches, MD;  Location: [facility] CATH LAB;  Service: Cardiovascular;;   PROSTATECTOMY     THORACENTESIS Left 12/31/2021   Procedure: THORACENTESIS;  Surgeon: Claudene Toribio BROCKS, MD;  Location: [facility] ENDOSCOPY;  Service: Pulmonary;  Laterality: Left;   TONSILLECTOMY     VIDEO ASSISTED THORACOSCOPY (VATS)/DECORTICATION Left  01/21/2022   Procedure: VIDEO ASSISTED THORACOSCOPY (VATS)/DECORTICATION;  Surgeon: Shyrl Linnie KIDD, MD;  Location: [facility] OR;  Service: Thoracic;  Laterality: Left;    REVIEW OF SYSTEMS:   Review of Systems  Constitutional: Stable fatigue. Negative for appetite change, chills, fever and unexpected weight change.  HENT: Positive for nasal congestion. Negative for mouth sores, nosebleeds, sore throat and trouble swallowing.   Eyes: Negative for eye problems and icterus.  Respiratory: Stable dyspnea on exertion and occasional cough. Negative for  hemoptysis and wheezing.   Cardiovascular: Negative for chest pain and leg swelling.  Gastrointestinal: Negative for abdominal pain, constipation, diarrhea, nausea and vomiting.  Genitourinary: Negative for bladder incontinence,  difficulty urinating, dysuria, frequency and hematuria.   Musculoskeletal: Negative for back pain, gait problem, neck pain and neck stiffness.  Skin: Positive for itching and rash on right forehead and neck.  Neurological: Negative for dizziness, extremity weakness, gait problem, headaches, light-headedness and seizures.  Hematological: Negative for adenopathy. Does not bruise/bleed easily.  Psychiatric/Behavioral: Negative for confusion, depression and sleep disturbance. The patient is not nervous/anxious.     PHYSICAL EXAMINATION:  There were no vitals taken for this visit.  ECOG PERFORMANCE STATUS: 1  Physical Exam  Constitutional: Oriented to person, place, and time and well-developed, well-nourished, and in no distress.  HENT:  Head: Normocephalic and atraumatic.  Mouth/Throat: Oropharynx is clear and moist. No oropharyngeal exudate.  Eyes: Conjunctivae are normal. Right eye exhibits no discharge. Left eye exhibits no discharge. No scleral icterus.  Neck: Normal range of motion. Neck supple.  Cardiovascular: Normal rate, regular rhythm, normal heart sounds and intact distal pulses.   Pulmonary/Chest: Effort normal.  Quiet breath sounds bilaterally. No respiratory distress. No wheezes. No rales.  Abdominal: Soft. Bowel sounds are normal. Exhibits no distension and no mass. There is no tenderness.  Musculoskeletal: Normal range of motion. Exhibits no edema. tenderness.  Lymphadenopathy:    No cervical adenopathy.  Neurological: Alert and oriented to person, place, and time. Exhibits normal muscle tone. Gait normal. Coordination normal.  Skin: Skin is warm and dry. Rash on forehead and right nec. Not diaphoretic. No erythema. No pallor.  Psychiatric: Mood, memory and judgment normal.  Vitals reviewed.  LABORATORY DATA: Lab Results  Component Value Date   WBC 8.3 03/29/2024   HGB 12.8 (L) 03/29/2024   HCT 39.0 03/29/2024   MCV 90.9 03/29/2024   PLT 173 03/29/2024      Chemistry      Component Value Date/Time   NA 142 03/29/2024 1027   K 4.5 03/29/2024 1027   CL 109 03/29/2024 1027   CO2 30 03/29/2024 1027   BUN 24 (H) 03/29/2024 1027   CREATININE 0.94 03/29/2024 1027      Component Value Date/Time   CALCIUM  8.9 03/29/2024 1027   ALKPHOS 64 03/29/2024 1027   AST 18 03/29/2024 1027   ALT 8 03/29/2024 1027   BILITOT 0.5 03/29/2024 1027       RADIOGRAPHIC STUDIES:  MR BRAIN W WO CONTRAST Result Date: 04/20/2024 CLINICAL DATA:  Provided history: Malignant neoplasm of unspecified part of unspecified bronchus or lung. Small cell lung cancer, staging. EXAM: MRI HEAD WITHOUT AND WITH CONTRAST TECHNIQUE: Multiplanar, multiecho pulse sequences of the brain and surrounding structures were obtained without and with intravenous contrast. CONTRAST:  6mL GADAVIST  GADOBUTROL  1 MMOL/ML IV SOLN COMPARISON:  Brain MRI 04/06/2023. FINDINGS: Brain: Mild-to-moderate generalized cerebral atrophy. No cortical encephalomalacia is identified. No significant cerebral white matter disease. There is no acute infarct. No evidence of an intracranial mass. No chronic intracranial blood products. No extra-axial fluid  collection. No midline shift. No pathologic intracranial enhancement identified. Vascular: Maintained flow voids within the proximal large arterial vessels. Skull and upper cervical spine: No focal worrisome marrow lesion. Incompletely assessed cervical spondylosis. Facet ankylosis on the right at C3-C4. Sinuses/Orbits: No mass or acute finding within the imaged orbits. Prior bilateral ocular lens replacement. Severe right frontal sinus disease (with complete sinus opacification). Mild mucosal thickening within bilateral ethmoid air cells. Mild mucosal thickening within the left maxillary sinus. Other: 1.8 cm focus of polypoid soft tissue within the posterior left nasal passage, extending into the nasopharynx (series 8, image 6).  IMPRESSION: 1. No evidence of intracranial metastatic disease. 2. Mild-to-moderate generalized cerebral atrophy. 3. Paranasal sinus disease as described (including severe right frontal sinusitis). 4. 1.8 cm focus of polypoid soft tissue within the posterior left nasal passage, extending into the nasopharynx. This is unchanged from the prior brain MRI of 04/06/2023. Consider ENT referral. Electronically Signed   By: Rockey Childs D.O.   On: 04/20/2024 08:30   IR IMAGING GUIDED PORT INSERTION Result Date: 04/19/2024 CLINICAL DATA:  Small cell lung carcinoma and need for porta cath for chemotherapy. EXAM: IMPLANTED PORT A CATH PLACEMENT WITH ULTRASOUND AND FLUOROSCOPIC GUIDANCE ANESTHESIA/SEDATION: Moderate (conscious) sedation was employed during this procedure. A total of Versed  1.0 mg and Fentanyl  50 mcg was administered intravenously. Moderate Sedation Time: 30 minutes. The patient's level of consciousness and vital signs were monitored continuously by radiology nursing throughout the procedure under my direct supervision. FLUOROSCOPY: Radiation Exposure Index: 1.0 mGy Kerma PROCEDURE: The procedure, risks, benefits, and alternatives were explained to the patient. Questions regarding the  procedure were encouraged and answered. The patient understands and consents to the procedure. A time-out was performed prior to initiating the procedure. Ultrasound was utilized to confirm patency of the right internal jugular vein. An ultrasound image was saved and recorded. The right neck and chest were prepped with chlorhexidine  in a sterile fashion, and a sterile drape was applied covering the operative field. Maximum barrier sterile technique with sterile gowns and gloves were used for the procedure. Local anesthesia was provided with 1% lidocaine . After creating a small venotomy incision, a 21 gauge needle was advanced into the right internal jugular vein under direct, real-time ultrasound guidance. Ultrasound image documentation was performed. After securing guidewire access, an 8 Fr dilator was placed. A J-wire was kinked to measure appropriate catheter length. A subcutaneous port pocket was then created along the upper chest wall utilizing sharp and blunt dissection. Portable cautery was utilized. The pocket was irrigated with sterile saline. A single lumen power injectable port was chosen for placement. The 8 Fr catheter was tunneled from the port pocket site to the venotomy incision. The port was placed in the pocket. External catheter was trimmed to appropriate length based on guidewire measurement. At the venotomy, an 8 Fr peel-away sheath was placed over a guidewire. The catheter was then placed through the sheath and the sheath removed. Final catheter positioning was confirmed and documented with a fluoroscopic spot image. The port was accessed with a needle and aspirated and flushed with heparinized saline. The access needle was removed. The venotomy and port pocket incisions were closed with subcutaneous 3-0 Monocryl and subcuticular 4-0 Vicryl. Dermabond was applied to both incisions. COMPLICATIONS: COMPLICATIONS None FINDINGS: After catheter placement, the tip lies at the cavo-atrial junction.  The catheter aspirates normally and is ready for immediate use. IMPRESSION: Placement of single lumen port a cath via right internal jugular vein. The catheter tip lies at the cavo-atrial junction. A power injectable port a cath was placed and is ready for immediate use. Electronically Signed   By: Marcey Moan M.D.   On: 04/19/2024 17:19     ASSESSMENT/PLAN:  This is a very pleasant 83 year old Caucasian male diagnosed with extensive stage (T3, N2, M1 a) small cell lung cancer.  He presented with an obstructive left upper lobe lung mass in addition to mediastinal lymphadenopathy.  He also has a malignant left pleural effusion and pleural-based metastases.  He also has a suspicious liver metastasis.  He was diagnosed in June 2023.  The patient is currently undergoing slightly reduced dose systemic chemotherapy with carboplatin  for an AUC of 4 on day 1, etoposide  80 mg per metered squared on days 1 2, and 3 with Imfinzi  on day 1 every 3 weeks.  He is status post 30 cycles.  Starting from cycle #5, the patient started maintenance immunotherapy with Imfinzi  IV every 4 weeks.   The patient was seen with Dr. Sherrod today.  Dr. Sherrod personally and independently reviewed the scan and discussed results with the patient today.  The scan showed no evidence of disease progression.  Dr. Sherrod recommends he continue on the same treatment at the same dose.   His brain MRI is negative for metastatic disease to the brain. It showed sinusitis. He is allergic to penicillins. Therefore, I have sent doxycycline  to the pharmacy instead.   Labs were reviewed. Recommend he proceed with cycle #31 today as scheduled.    See him back for follow-up visit in 4 weeks for evaluation repeat blood work before undergoing cycle #32.   Rash on forehead Chronic rash stable. Advised on steroid cream use for symptom management. - Use steroid cream as needed for itching or bothersome symptoms.   Allergic contact dermatitis  at port site Decreased itching at port site, likely adhesive or cleaner sensitivity. - Monitor port site for rash or allergic reaction. - Use different adhesive or cleaner if allergic reaction occurs. - Keep port site covered in the shower until Steri-Strips come off.  Chronic obstructive pulmonary disease with emphysema Chronic cough with mucus production and exertional dyspnea consistent with COPD. No infection on scan. - Recommend over-the-counter Mucinex for secretion management. - Consider cough suppressant if needed. - Advise sitting upright for at least an hour post-meal to prevent esophageal irritation as scan shows esophagitis. He can take OTC medications for GERD.  The patient was advised to call immediately if he has any concerning symptoms in the interval. The patient voices understanding of current disease status and treatment options and is in agreement with the current care plan. All questions were answered. The patient knows to call the clinic with any problems, questions or concerns. We can certainly see the patient much sooner if necessary    No orders of the defined types were placed in this encounter.    Tyquez Hollibaugh L Mancil Pfenning, PA-C 04/20/24  ADDENDUM: Hematology/oncology Attending: I had a face-to-face encounter with the patient today.  I reviewed his record, lab, scan and recommended his care plan.  This is a very pleasant 83 years old white male with extensive stage small cell lung cancer diagnosed in June 2023 and started palliative systemic chemoimmunotherapy initially with carboplatin , etoposide  and durvalumab  every 3 weeks status post 4 cycles and starting from cycle #5 he has been on maintenance treatment with nivolumab 1800 mg IV every 4 weeks status post a total of 30 cycles.  The patient has been tolerating his treatment with immunotherapy fairly well. He had repeat CT scan of the chest, abdomen and pelvis performed recently.  I personally and  independently reviewed the scan and discussed the result with the patient and his wife.  His scan showed no concerning findings for disease progression. I recommended for the patient to continue his current treatment with Durvalumab  with the same dose. I will see him back for follow-up visit in 4 weeks for evaluation before the next cycle of his treatment. The patient was advised to call immediately if he has any concerning symptoms in the interval. The total time  spent in the appointment was 30 minutes including review of chart and various tests results, discussions about plan of care and coordination of care plan . Disclaimer: This note was dictated with voice recognition software. Similar sounding words can inadvertently be transcribed and may be missed upon review. Sherrod MARLA Sherrod, MD

## 2024-04-25 ENCOUNTER — Other Ambulatory Visit: Payer: Self-pay

## 2024-04-25 ENCOUNTER — Inpatient Hospital Stay (HOSPITAL_BASED_OUTPATIENT_CLINIC_OR_DEPARTMENT_OTHER): Admitting: Physician Assistant

## 2024-04-25 ENCOUNTER — Inpatient Hospital Stay

## 2024-04-25 ENCOUNTER — Inpatient Hospital Stay: Attending: Internal Medicine

## 2024-04-25 ENCOUNTER — Other Ambulatory Visit

## 2024-04-25 VITALS — BP 120/56 | HR 52 | Temp 97.2°F | Resp 16 | Ht 69.0 in | Wt 146.0 lb

## 2024-04-25 DIAGNOSIS — C3482 Malignant neoplasm of overlapping sites of left bronchus and lung: Secondary | ICD-10-CM

## 2024-04-25 DIAGNOSIS — Z5112 Encounter for antineoplastic immunotherapy: Secondary | ICD-10-CM | POA: Diagnosis present

## 2024-04-25 DIAGNOSIS — J439 Emphysema, unspecified: Secondary | ICD-10-CM | POA: Diagnosis not present

## 2024-04-25 DIAGNOSIS — J329 Chronic sinusitis, unspecified: Secondary | ICD-10-CM

## 2024-04-25 DIAGNOSIS — C3412 Malignant neoplasm of upper lobe, left bronchus or lung: Secondary | ICD-10-CM | POA: Insufficient documentation

## 2024-04-25 DIAGNOSIS — L239 Allergic contact dermatitis, unspecified cause: Secondary | ICD-10-CM | POA: Insufficient documentation

## 2024-04-25 DIAGNOSIS — J910 Malignant pleural effusion: Secondary | ICD-10-CM | POA: Diagnosis not present

## 2024-04-25 LAB — CMP (CANCER CENTER ONLY)
ALT: 8 U/L (ref 0–44)
AST: 18 U/L (ref 15–41)
Albumin: 3.8 g/dL (ref 3.5–5.0)
Alkaline Phosphatase: 63 U/L (ref 38–126)
Anion gap: 5 (ref 5–15)
BUN: 17 mg/dL (ref 8–23)
CO2: 29 mmol/L (ref 22–32)
Calcium: 8.6 mg/dL — ABNORMAL LOW (ref 8.9–10.3)
Chloride: 108 mmol/L (ref 98–111)
Creatinine: 0.89 mg/dL (ref 0.61–1.24)
GFR, Estimated: >60 mL/min (ref >60)
Glucose, Bld: 89 mg/dL (ref 70–99)
Potassium: 4.1 mmol/L (ref 3.5–5.1)
Sodium: 142 mmol/L (ref 135–145)
Total Bilirubin: 0.7 mg/dL (ref 0.0–1.2)
Total Protein: 6.4 g/dL — ABNORMAL LOW (ref 6.5–8.1)

## 2024-04-25 LAB — CBC WITH DIFFERENTIAL (CANCER CENTER ONLY)
Abs Immature Granulocytes: 0.02 K/uL (ref 0.00–0.07)
Basophils Absolute: 0 K/uL (ref 0.0–0.1)
Basophils Relative: 0 %
Eosinophils Absolute: 0.6 K/uL — ABNORMAL HIGH (ref 0.0–0.5)
Eosinophils Relative: 7 %
HCT: 38.8 % — ABNORMAL LOW (ref 39.0–52.0)
Hemoglobin: 12.8 g/dL — ABNORMAL LOW (ref 13.0–17.0)
Immature Granulocytes: 0 %
Lymphocytes Relative: 19 %
Lymphs Abs: 1.5 K/uL (ref 0.7–4.0)
MCH: 29.5 pg (ref 26.0–34.0)
MCHC: 33 g/dL (ref 30.0–36.0)
MCV: 89.4 fL (ref 80.0–100.0)
Monocytes Absolute: 0.7 K/uL (ref 0.1–1.0)
Monocytes Relative: 9 %
Neutro Abs: 4.9 K/uL (ref 1.7–7.7)
Neutrophils Relative %: 65 %
Platelet Count: 169 K/uL (ref 150–400)
RBC: 4.34 MIL/uL (ref 4.22–5.81)
RDW: 13.5 % (ref 11.5–15.5)
WBC Count: 7.7 K/uL (ref 4.0–10.5)
nRBC: 0 % (ref 0.0–0.2)

## 2024-04-25 MED ORDER — SODIUM CHLORIDE 0.9 % IV SOLN: Freq: Once | INTRAVENOUS | Status: AC

## 2024-04-25 MED ORDER — DOXYCYCLINE HYCLATE 100 MG PO TABS: 100.0000 mg | ORAL_TABLET | Freq: Two times a day (BID) | ORAL | 0 refills | Status: AC

## 2024-04-25 MED ORDER — SODIUM CHLORIDE 0.9 % IV SOLN
1500.0000 mg | Freq: Once | INTRAVENOUS | Status: AC
  Administered 2024-04-25: 1500 mg via INTRAVENOUS
  Filled 2024-04-25: qty 30

## 2024-04-25 MED ORDER — LIDOCAINE-PRILOCAINE 2.5-2.5 % EX CREA: 1.0000 | TOPICAL_CREAM | CUTANEOUS | 2 refills | Status: AC | PRN

## 2024-04-25 NOTE — Patient Instructions (Signed)

## 2024-04-25 NOTE — Patient Instructions (Signed)
 [facility] - A DEPT OF [place]  [facility]  Discharge Instructions: Thank you for choosing [facility] to provide your oncology and hematology care.   If you have a lab appointment with the Cancer Center, please go directly to the Cancer Center and check in at the registration area.   Wear comfortable clothing and clothing appropriate for easy access to any Portacath or PICC line.   We strive to give you quality time with your provider. You may need to reschedule your appointment if you arrive late (15 or more minutes).  Arriving late affects you and other patients whose appointments are after yours.  Also, if you miss three or more appointments without notifying the office, you may be dismissed from the clinic at the provider's discretion.      For prescription refill requests, have your pharmacy contact our office and allow 72 hours for refills to be completed.    Today you received the following chemotherapy and/or immunotherapy agents imfinzi       To help prevent nausea and vomiting after your treatment, we encourage you to take your nausea medication as directed.  BELOW ARE SYMPTOMS THAT SHOULD BE REPORTED IMMEDIATELY: *FEVER GREATER THAN 100.4 F (38 C) OR HIGHER *CHILLS OR SWEATING *NAUSEA AND VOMITING THAT IS NOT CONTROLLED WITH YOUR NAUSEA MEDICATION *UNUSUAL SHORTNESS OF BREATH *UNUSUAL BRUISING OR BLEEDING *URINARY PROBLEMS (pain or burning when urinating, or frequent urination) *BOWEL PROBLEMS (unusual diarrhea, constipation, pain near the anus) TENDERNESS IN MOUTH AND THROAT WITH OR WITHOUT PRESENCE OF ULCERS (sore throat, sores in mouth, or a toothache) UNUSUAL RASH, SWELLING OR PAIN  UNUSUAL VAGINAL DISCHARGE OR ITCHING   Items with * indicate a potential emergency and should be followed up as soon as possible or go to the Emergency Department if any problems should occur.  Please show the CHEMOTHERAPY ALERT CARD or IMMUNOTHERAPY  ALERT CARD at check-in to the Emergency Department and triage nurse.  Should you have questions after your visit or need to cancel or reschedule your appointment, please contact [facility] - A DEPT [facility]  Dept: [phone]  and follow the prompts.  Office hours are 8:00 a.m. to 4:30 p.m. Monday - Friday. Please note that voicemails left after 4:00 p.m. may not be returned until the following business day.  We are closed weekends and major holidays. You have access to a nurse at all times for urgent questions. Please call the main number to the clinic Dept: [phone] and follow the prompts.   For any non-urgent questions, you may also contact your provider using MyChart. We now offer e-Visits for anyone 34 and older to request care online for non-urgent symptoms. For details visit [email or web address].   Also download the MyChart app! Go to the app store, search MyChart, open the app, select [facility], and log in with your MyChart username and password.

## 2024-04-26 ENCOUNTER — Telehealth: Payer: Self-pay | Admitting: Internal Medicine

## 2024-04-26 NOTE — Telephone Encounter (Signed)
 Scheduled appointments per WQ. Called and left a VM with appointment details for the patient.

## 2024-04-27 ENCOUNTER — Other Ambulatory Visit: Payer: Self-pay

## 2024-05-03 ENCOUNTER — Ambulatory Visit (HOSPITAL_COMMUNITY)
Admission: RE | Admit: 2024-05-03 | Discharge: 2024-05-03 | Disposition: A | Discharge status: Home / Self Care | Source: Ambulatory Visit | Attending: Radiology | Admitting: Radiology

## 2024-05-03 ENCOUNTER — Other Ambulatory Visit (HOSPITAL_COMMUNITY): Payer: Self-pay | Admitting: Radiology

## 2024-05-03 ENCOUNTER — Encounter (HOSPITAL_COMMUNITY): Payer: Self-pay | Admitting: Radiology

## 2024-05-03 DIAGNOSIS — C3482 Malignant neoplasm of overlapping sites of left bronchus and lung: Secondary | ICD-10-CM

## 2024-05-03 DIAGNOSIS — Z23 Encounter for immunization: Secondary | ICD-10-CM | POA: Diagnosis not present

## 2024-05-03 HISTORY — PX: IR PATIENT EVAL TECH 0-60 MINS: IMG5564

## 2024-05-03 NOTE — Procedures (Signed)
 Mr Pedro Pope came in today concerned about the red appearance of his port incision. After being seen by Franky Rakers PA and Dr Hughes, it was determined the site was healing well as expected. He was given instructions and release home.

## 2024-05-16 NOTE — Addendum Note (Signed)
 Encounter addended by: Janice Lynwood BROCKS on: 05/16/2024 10:59 AM  Actions taken: Imaging Exam ended

## 2024-05-23 ENCOUNTER — Other Ambulatory Visit

## 2024-05-23 ENCOUNTER — Inpatient Hospital Stay

## 2024-05-23 ENCOUNTER — Inpatient Hospital Stay: Attending: Internal Medicine | Admitting: Internal Medicine

## 2024-05-23 ENCOUNTER — Other Ambulatory Visit: Payer: Self-pay

## 2024-05-23 VITALS — BP 138/64 | HR 53 | Temp 97.9°F | Resp 17 | Ht 69.0 in | Wt 146.9 lb

## 2024-05-23 DIAGNOSIS — C3412 Malignant neoplasm of upper lobe, left bronchus or lung: Secondary | ICD-10-CM | POA: Insufficient documentation

## 2024-05-23 DIAGNOSIS — C3482 Malignant neoplasm of overlapping sites of left bronchus and lung: Secondary | ICD-10-CM

## 2024-05-23 DIAGNOSIS — Z5112 Encounter for antineoplastic immunotherapy: Secondary | ICD-10-CM | POA: Insufficient documentation

## 2024-05-23 LAB — CMP (CANCER CENTER ONLY)
ALT: 10 U/L (ref 0–44)
AST: 18 U/L (ref 15–41)
Albumin: 3.6 g/dL (ref 3.5–5.0)
Alkaline Phosphatase: 71 U/L (ref 38–126)
Anion gap: 3 — ABNORMAL LOW (ref 5–15)
BUN: 18 mg/dL (ref 8–23)
CO2: 30 mmol/L (ref 22–32)
Calcium: 9.3 mg/dL (ref 8.9–10.3)
Chloride: 108 mmol/L (ref 98–111)
Creatinine: 1.01 mg/dL (ref 0.61–1.24)
GFR, Estimated: >60 mL/min (ref >60)
Glucose, Bld: 101 mg/dL — ABNORMAL HIGH (ref 70–99)
Potassium: 4 mmol/L (ref 3.5–5.1)
Sodium: 141 mmol/L (ref 135–145)
Total Bilirubin: 0.5 mg/dL (ref 0.0–1.2)
Total Protein: 6.3 g/dL — ABNORMAL LOW (ref 6.5–8.1)

## 2024-05-23 LAB — CBC WITH DIFFERENTIAL (CANCER CENTER ONLY)
Abs Immature Granulocytes: 0.01 K/uL (ref 0.00–0.07)
Basophils Absolute: 0 K/uL (ref 0.0–0.1)
Basophils Relative: 0 %
Eosinophils Absolute: 0.4 K/uL (ref 0.0–0.5)
Eosinophils Relative: 6 %
HCT: 37.8 % — ABNORMAL LOW (ref 39.0–52.0)
Hemoglobin: 12.8 g/dL — ABNORMAL LOW (ref 13.0–17.0)
Immature Granulocytes: 0 %
Lymphocytes Relative: 17 %
Lymphs Abs: 1.2 K/uL (ref 0.7–4.0)
MCH: 30 pg (ref 26.0–34.0)
MCHC: 33.9 g/dL (ref 30.0–36.0)
MCV: 88.7 fL (ref 80.0–100.0)
Monocytes Absolute: 1 K/uL (ref 0.1–1.0)
Monocytes Relative: 14 %
Neutro Abs: 4.4 K/uL (ref 1.7–7.7)
Neutrophils Relative %: 63 %
Platelet Count: 174 K/uL (ref 150–400)
RBC: 4.26 MIL/uL (ref 4.22–5.81)
RDW: 13.7 % (ref 11.5–15.5)
WBC Count: 7 K/uL (ref 4.0–10.5)
nRBC: 0 % (ref 0.0–0.2)

## 2024-05-23 MED ORDER — SODIUM CHLORIDE 0.9 % IV SOLN: Freq: Once | INTRAVENOUS | Status: AC

## 2024-05-23 MED ORDER — SODIUM CHLORIDE 0.9 % IV SOLN
1500.0000 mg | Freq: Once | INTRAVENOUS | Status: AC
  Administered 2024-05-23: 1500 mg via INTRAVENOUS
  Filled 2024-05-23: qty 30

## 2024-05-23 NOTE — Progress Notes (Signed)
 [facility] [phone]   [phone]  OFFICE PROGRESS NOTE  Pedro Rush, MD [address] [place] [address]  DIAGNOSIS: Extensive stage (T3, N2, M1b) small cell lung cancer presented with obstructive left upper lobe lung mass in addition to mediastinal lymphadenopathy and malignant left pleural effusion as well as pleural-based metastasis as well as suspicious liver metastasis diagnosed in June 2023.  PRIOR THERAPY: None  CURRENT THERAPY:  1) Palliative systemic chemotherapy with carboplatin  initiated for AUC of 4 on day 1 and etoposide  80 Mg/M2 on days 1, 2 and 3 with Cosela  before the chemotherapy and Imfinzi  1500 Mg IV on day 1 every 3 weeks.  Status post 31 cycles.  Starting from cycle #5 the patient will be on maintenance treatment with Imfinzi  1500 Mg IV every 4 weeks.  2) palliative radiotherapy to enlarging gastrohepatic ligament lymph node under the care of Dr. Shannon expected to be completed on August 26, 2023.  INTERVAL HISTORY: Pedro Pope. 83 y.o. male returns to the clinic today for follow-up visit. Discussed the use of AI scribe software for clinical note transcription with the patient, who gave verbal consent to proceed.  History of Present Illness Pedro Pope. is an 83 year old male with extensive stage small cell lung cancer who presents for evaluation before starting cycle number 32 of maintenance treatment.  He was diagnosed with extensive stage small cell lung cancer in June 2023. Initially, he underwent palliative systemic chemo-immunotherapy with carboplatin , etoposide , and durvalumab  every three weeks for four cycles. Since cycle five, he has been on maintenance treatment with single-agent durvalumab  every four weeks, and he is now status post thirty-one cycles.  He feels 'pretty good' and has no new complaints since the last visit. No chest pain, breathing issues, nausea, vomiting, diarrhea, or rash, except  for a 'little spot' which was not further elaborated. He recently took a ten-day trip to the [place] states, indicating a level of physical activity and travel.    MEDICAL HISTORY: Past Medical History:  Diagnosis Date   Anosmia    CAD (coronary artery disease), native coronary artery    a. 10/29/2013 antlat STEMI s/p DES to pLAD; residual 80 % proximal and 60% mid LCx diease   Chronic headaches    Dressler's syndrome (HCC)    a. placed on colchicine     ED (erectile dysfunction)    Elevated TSH    Fatty liver    Gout    Ischemic cardiomyopathy    a. 2D ECHO 11/04/14 w/ EF 40-45% with LV WMA, G2DD, mild Pedro, mild LA dilation, mod TR.   PAF (paroxysmal atrial fibrillation) (HCC)    a. Dx on 11/03/14 admission. Not placed on longterm AC due to DAPT w/ receent DES. Will plan for outpt heart monitor    Pre-diabetes    a. HgA1c 6.1 in 10/2014   Prostate cancer [facility])    s/p prostatectomy and XRT   Rheumatic fever 08/18/1945   Small cell lung cancer (HCC) 01/2022    ALLERGIES:  is allergic to penicillins, clindamycin, corn syrup [glucose], and aspirin  adult low [aspirin ].  MEDICATIONS:  Current Outpatient Medications  Medication Sig Dispense Refill   acetaminophen  (TYLENOL ) 500 MG tablet Take 500 mg by mouth every 6 (six) hours as needed for mild pain or moderate pain.     albuterol  (VENTOLIN  HFA) 108 (90 Base) MCG/ACT inhaler Inhale 1-2 puffs into the lungs every 6 (six) hours as needed.  8 g 2   ascorbic acid  (VITAMIN C ) 500 MG tablet Take 500 mg by mouth 2 (two) times daily.     Blood Pressure Monitor KIT For daily blood pressure monitoring;  Arm Cuff 1 each 0   carvedilol  (COREG ) 3.125 MG tablet TAKE 1 TABLET(3.125 MG) BY MOUTH TWICE DAILY WITH A MEAL 180 tablet 3   doxycycline  (VIBRA -TABS) 100 MG tablet Take 1 tablet (100 mg total) by mouth 2 (two) times daily. 14 tablet 0   escitalopram (LEXAPRO) 5 MG tablet Take 5 mg by mouth daily.     lidocaine -prilocaine  (EMLA ) cream Apply 1  Application topically as needed (apply 1-1.5 hours prior to port flush with lab appointment.). 30 g 2   PROCTO-MED HC  2.5 % rectal cream Apply topically.     triamcinolone  cream (KENALOG ) 0.1 % Apply 1 Application topically 2 (two) times daily. 80 g 0   No current facility-administered medications for this visit.   Facility-Administered Medications Ordered in Other Visits  Medication Dose Route Frequency Provider Last Rate Last Admin   sodium chloride  flush (NS) 0.9 % injection 10 mL  10 mL Intracatheter Once Pedro Sherrod, MD        SURGICAL HISTORY:  Past Surgical History:  Procedure Laterality Date   CORONARY ANGIOPLASTY WITH STENT PLACEMENT  10/30/14   STEMI s/p DES to proximal LAD; residual 80 % proximal and 60% mid LCx diease   IR IMAGING GUIDED PORT INSERTION  03/12/2022   IR IMAGING GUIDED PORT INSERTION  04/19/2024   IR PATIENT EVAL TECH 0-60 MINS  10/20/2022   IR PATIENT EVAL TECH 0-60 MINS  10/27/2022   IR PATIENT EVAL TECH 0-60 MINS  10/30/2022   IR PATIENT EVAL TECH 0-60 MINS  10/23/2022   IR PATIENT EVAL TECH 0-60 MINS  11/06/2022   IR PATIENT EVAL TECH 0-60 MINS  11/13/2022   IR PATIENT EVAL TECH 0-60 MINS  12/01/2022   IR PATIENT EVAL TECH 0-60 MINS  05/03/2024   IR RADIOLOGIST EVAL & MGMT  10/02/2022   IR RADIOLOGIST EVAL & MGMT  10/13/2022   IR RADIOLOGIST EVAL & MGMT  10/14/2022   IR RADIOLOGIST EVAL & MGMT  10/17/2022   IR RADIOLOGIST EVAL & MGMT  10/20/2022   IR REMOVAL TUN ACCESS W/ PORT W/O FL MOD SED  10/13/2022   LEFT HEART CATHETERIZATION WITH CORONARY ANGIOGRAM N/A 10/30/2014   Procedure: LEFT HEART CATHETERIZATION WITH CORONARY ANGIOGRAM;  Surgeon: Pedro JINNY Lesches, MD;  Location[facility] LAB;  Service: Cardiovascular;  Laterality: N/A;   PERCUTANEOUS CORONARY STENT INTERVENTION (PCI-S)  10/30/2014   Procedure: PERCUTANEOUS CORONARY STENT INTERVENTION (PCI-S);  Surgeon: Pedro JINNY Lesches, MD;  Location: [facility] CATH LAB;  Service: Cardiovascular;;   PROSTATECTOMY      THORACENTESIS Left 12/31/2021   Procedure: THORACENTESIS;  Surgeon: Pedro Toribio BROCKS, MD;  Location: [facility] ENDOSCOPY;  Service: Pulmonary;  Laterality: Left;   TONSILLECTOMY     VIDEO ASSISTED THORACOSCOPY (VATS)/DECORTICATION Left 01/21/2022   Procedure: VIDEO ASSISTED THORACOSCOPY (VATS)/DECORTICATION;  Surgeon: Pedro Linnie KIDD, MD;  Location: [facility] OR;  Service: Thoracic;  Laterality: Left;    REVIEW OF SYSTEMS:  A comprehensive review of systems was negative.   PHYSICAL EXAMINATION: General appearance: alert, cooperative, and no distress Head: Normocephalic, without obvious abnormality, atraumatic Neck: no adenopathy, no JVD, supple, symmetrical, trachea midline, and thyroid  not enlarged, symmetric, no tenderness/mass/nodules Lymph nodes: Cervical, supraclavicular, and axillary nodes normal. Resp: clear to auscultation bilaterally Back: symmetric, no curvature. ROM normal. No  CVA tenderness. Cardio: regular rate and rhythm, S1, S2 normal, no murmur, click, rub or gallop GI: soft, non-tender; bowel sounds normal; no masses,  no organomegaly Extremities: extremities normal, atraumatic, no cyanosis or edema  ECOG PERFORMANCE STATUS: 1 - Symptomatic but completely ambulatory  Blood pressure 138/64, pulse (!) 53, temperature 97.9 F (36.6 C), resp. rate 17, height 5' 9 (1.753 m), weight 146 lb 14.4 oz (66.6 kg), SpO2 98%.  LABORATORY DATA: Lab Results  Component Value Date   WBC 7.0 05/23/2024   HGB 12.8 (L) 05/23/2024   HCT 37.8 (L) 05/23/2024   MCV 88.7 05/23/2024   PLT 174 05/23/2024      Chemistry      Component Value Date/Time   NA 142 04/25/2024 1037   K 4.1 04/25/2024 1037   CL 108 04/25/2024 1037   CO2 29 04/25/2024 1037   BUN 17 04/25/2024 1037   CREATININE 0.89 04/25/2024 1037      Component Value Date/Time   CALCIUM  8.6 (L) 04/25/2024 1037   ALKPHOS 63 04/25/2024 1037   AST 18 04/25/2024 1037   ALT 8 04/25/2024 1037   BILITOT 0.7 04/25/2024 1037        RADIOGRAPHIC STUDIES: IR PATIENT EVAL TECH 0-60 MINS Result Date: 05/03/2024 Pedro Pope POUR     05/03/2024  1:41 PM Pedro Pope came in today concerned about the red appearance of his port incision. After being seen by Franky Rakers PA and Dr Hughes, it was determined the site was healing well as expected. He was given instructions and release home.       ASSESSMENT AND PLAN: This is a very pleasant 83 years old white male with Extensive stage (T3, N2, M1a) small cell lung cancer presented with obstructive left upper lobe lung mass in addition to mediastinal lymphadenopathy and malignant left pleural effusion as well as pleural-based metastasis and suspicious liver metastasis diagnosed in June 2023. The patient is currently undergoing systemic chemotherapy with carboplatin  for AUC of 4 on day 1, etoposide  80 Mg/M2 on days 1, 2 and 3 with Imfinzi  240 Mg/M2 on the days before chemotherapy and Imfinzi  1500 Mg IV on day 1 every 3 weeks.  Status post 31 cycles.  Starting from cycle #5 he will be on maintenance treatment with immunotherapy with Imfinzi  1500 Mg IV every 4 weeks.  He has been tolerating this treatment fairly well. He underwent palliative radiotherapy to enlarging gastrohepatic ligament lymph node under the care of Dr. Shannon completed on August 26, 2023. He has been tolerating this treatment fairly well. Assessment and Plan Assessment & Plan Extensive stage small cell lung cancer Initially diagnosed in June 2023, currently on maintenance therapy with durvalumab , having completed 31 cycles. No new symptoms reported, and lab work is stable except for chronic mild anemia. He is tolerating the treatment well without significant side effects. - Administer cycle 32 of durvalumab  maintenance therapy - Schedule follow-up appointment in 4 weeks  Chronic mild anemia No new concerns or symptoms related to anemia. The patient was advised to call immediately if he has any concerning  symptoms in the interval. The patient voices understanding of current disease status and treatment options and is in agreement with the current care plan.  All questions were answered. The patient knows to call the clinic with any problems, questions or concerns. We can certainly see the patient much sooner if necessary.  The total time spent in the appointment was 20 minutes.  Disclaimer: This note was dictated with voice  recognition software. Similar sounding words can inadvertently be transcribed and may not be corrected upon review.

## 2024-05-23 NOTE — Patient Instructions (Signed)

## 2024-05-26 DIAGNOSIS — I4891 Unspecified atrial fibrillation: Secondary | ICD-10-CM | POA: Diagnosis not present

## 2024-05-26 DIAGNOSIS — J84112 Idiopathic pulmonary fibrosis: Secondary | ICD-10-CM | POA: Diagnosis not present

## 2024-05-26 DIAGNOSIS — I255 Ischemic cardiomyopathy: Secondary | ICD-10-CM | POA: Diagnosis not present

## 2024-05-26 DIAGNOSIS — C3482 Malignant neoplasm of overlapping sites of left bronchus and lung: Secondary | ICD-10-CM | POA: Diagnosis not present

## 2024-05-26 DIAGNOSIS — I700 Atherosclerosis of aorta: Secondary | ICD-10-CM | POA: Diagnosis not present

## 2024-05-26 DIAGNOSIS — G4734 Idiopathic sleep related nonobstructive alveolar hypoventilation: Secondary | ICD-10-CM | POA: Diagnosis not present

## 2024-05-26 DIAGNOSIS — J439 Emphysema, unspecified: Secondary | ICD-10-CM | POA: Diagnosis not present

## 2024-05-26 DIAGNOSIS — D6869 Other thrombophilia: Secondary | ICD-10-CM | POA: Diagnosis not present

## 2024-05-26 DIAGNOSIS — C3490 Malignant neoplasm of unspecified part of unspecified bronchus or lung: Secondary | ICD-10-CM | POA: Diagnosis not present

## 2024-05-26 DIAGNOSIS — I509 Heart failure, unspecified: Secondary | ICD-10-CM | POA: Diagnosis not present

## 2024-05-26 DIAGNOSIS — C61 Malignant neoplasm of prostate: Secondary | ICD-10-CM | POA: Diagnosis not present

## 2024-05-26 DIAGNOSIS — I272 Pulmonary hypertension, unspecified: Secondary | ICD-10-CM | POA: Diagnosis not present

## 2024-05-27 ENCOUNTER — Other Ambulatory Visit: Payer: Self-pay

## 2024-05-30 ENCOUNTER — Encounter: Payer: Self-pay | Admitting: Internal Medicine

## 2024-05-30 DIAGNOSIS — J189 Pneumonia, unspecified organism: Secondary | ICD-10-CM | POA: Diagnosis not present

## 2024-05-30 DIAGNOSIS — R5383 Other fatigue: Secondary | ICD-10-CM | POA: Diagnosis not present

## 2024-05-30 DIAGNOSIS — R0981 Nasal congestion: Secondary | ICD-10-CM | POA: Diagnosis not present

## 2024-05-30 DIAGNOSIS — C3490 Malignant neoplasm of unspecified part of unspecified bronchus or lung: Secondary | ICD-10-CM | POA: Diagnosis not present

## 2024-05-30 DIAGNOSIS — C3482 Malignant neoplasm of overlapping sites of left bronchus and lung: Secondary | ICD-10-CM | POA: Diagnosis not present

## 2024-05-30 DIAGNOSIS — R051 Acute cough: Secondary | ICD-10-CM | POA: Diagnosis not present

## 2024-05-30 DIAGNOSIS — Z1152 Encounter for screening for COVID-19: Secondary | ICD-10-CM | POA: Diagnosis not present

## 2024-05-30 DIAGNOSIS — J439 Emphysema, unspecified: Secondary | ICD-10-CM | POA: Diagnosis not present

## 2024-05-31 ENCOUNTER — Telehealth: Payer: Self-pay | Admitting: *Deleted

## 2024-05-31 NOTE — Telephone Encounter (Signed)
 Spoke with pt. and spouse Dagoberto. Pt. states he is taking antibiotics, Tylenol  as needed, and will start Mucinex today. He is feeling better. Instructed to call for any further issues or concerns.

## 2024-06-07 ENCOUNTER — Other Ambulatory Visit: Payer: Self-pay

## 2024-06-15 NOTE — Progress Notes (Signed)
 [facility] OFFICE PROGRESS NOTE  Pedro Rush, MD [address] [place] [address]  DIAGNOSIS: Extensive stage (T3, N2, M1b) small cell lung cancer presented with obstructive left upper lobe lung mass in addition to mediastinal lymphadenopathy and malignant left pleural effusion as well as pleural-based metastasis as well as suspicious liver metastasis diagnosed in June 2023   PRIOR THERAPY: Palliative radiotherapy to enlarging gastrohepatic ligament lymph node under the care of Dr. Shannon completed January 8th, 2025.   CURRENT THERAPY: Palliative systemic chemotherapy with carboplatin  initiated for AUC of 4 on day 1 and etoposide  80 Mg/M2 on days 1, 2 and 3 with Cosela  before the chemotherapy and Imfinzi  1500 Mg IV on day 1 every 3 weeks.  Status post 32 cycles.  Starting from cycle #5 the patient will be on maintenance treatment with Imfinzi  1500 Mg IV every 4 weeks.     INTERVAL HISTORY: Pedro Pope. 83 y.o. male returns to the clinic today for a follow-up visit accompanied by his wife. The patient was last seen by Dr. Sherrod 4 weeks ago.   In the interval since last being seen, Approximately two to three weeks ago, he was diagnosed with pneumonia, confirmed by a chest x-ray at his PCP's office. He experienced a persistent cough with mucus production, fever, and a slight temperature. He was treated with a five-day course of antibiotics. Since completing the antibiotics, he feels mostly back to normal, although recovery has been slow. No current fever, chills, night sweats, or significant weight loss.  He has a history of emphysema, which causes chronic shortness of breath, especially when walking. He continues to experience mucus production, described as yellow, and coughs it up intermittently. He uses honey and lemon to manage his cough, which is not constant. No blood in the mucus, chest pain, nausea, vomiting, diarrhea, or constipation.  He uses albuterol  sulfate  as a rescue inhaler for his emphysema, labeled for use as needed. He sleeps with oxygen  every night and has a home oxygen  monitor. He mentions a previous prescription for Spiriva , but it was not affordable due to insurance issues. He does not currently use a maintenance inhaler.  He reports a history of a rash on his forehead, which he states has improved.  He is here today for evaluation and repeat blood work before undergoing cycle #33   MEDICAL HISTORY: Past Medical History:  Diagnosis Date   Anosmia    CAD (coronary artery disease), native coronary artery    a. 10/29/2013 antlat STEMI s/p DES to pLAD; residual 80 % proximal and 60% mid LCx diease   Chronic headaches    Dressler's syndrome (HCC)    a. placed on colchicine     ED (erectile dysfunction)    Elevated TSH    Fatty liver    Gout    Ischemic cardiomyopathy    a. 2D ECHO 11/04/14 w/ EF 40-45% with LV WMA, G2DD, mild MR, mild LA dilation, mod TR.   PAF (paroxysmal atrial fibrillation) (HCC)    a. Dx on 11/03/14 admission. Not placed on longterm AC due to DAPT w/ receent DES. Will plan for outpt heart monitor    Pre-diabetes    a. HgA1c 6.1 in 10/2014   Prostate cancer [facility])    s/p prostatectomy and XRT   Rheumatic fever [birth year]1945   Small cell lung cancer (HCC) 01/2022    ALLERGIES:  is allergic to penicillins, clindamycin, corn syrup [glucose], and aspirin  adult low [aspirin ].  MEDICATIONS:  Current  Outpatient Medications  Medication Sig Dispense Refill   acetaminophen  (TYLENOL ) 500 MG tablet Take 500 mg by mouth every 6 (six) hours as needed for mild pain or moderate pain.     albuterol  (VENTOLIN  HFA) 108 (90 Base) MCG/ACT inhaler Inhale 1-2 puffs into the lungs every 6 (six) hours as needed. 8 g 2   ascorbic acid  (VITAMIN C ) 500 MG tablet Take 500 mg by mouth 2 (two) times daily.     Blood Pressure Monitor KIT For daily blood pressure monitoring;  Arm Cuff 1 each 0   carvedilol  (COREG ) 3.125 MG tablet TAKE 1  TABLET(3.125 MG) BY MOUTH TWICE DAILY WITH A MEAL 180 tablet 3   doxycycline  (VIBRA -TABS) 100 MG tablet Take 1 tablet (100 mg total) by mouth 2 (two) times daily. 14 tablet 0   escitalopram (LEXAPRO) 5 MG tablet Take 5 mg by mouth daily.     lidocaine -prilocaine  (EMLA ) cream Apply 1 Application topically as needed (apply 1-1.5 hours prior to port flush with lab appointment.). 30 g 2   PROCTO-MED HC  2.5 % rectal cream Apply topically.     triamcinolone  cream (KENALOG ) 0.1 % Apply 1 Application topically 2 (two) times daily. 80 g 0   No current facility-administered medications for this visit.   Facility-Administered Medications Ordered in Other Visits  Medication Dose Route Frequency Provider Last Rate Last Admin   sodium chloride  flush (NS) 0.9 % injection 10 mL  10 mL Intracatheter Once Pedro Pope Sherrod, MD        SURGICAL HISTORY:  Past Surgical History:  Procedure Laterality Date   CORONARY ANGIOPLASTY WITH STENT PLACEMENT  10/30/14   STEMI s/p DES to proximal LAD; residual 80 % proximal and 60% mid LCx diease   IR IMAGING GUIDED PORT INSERTION  03/12/2022   IR IMAGING GUIDED PORT INSERTION  04/19/2024   IR PATIENT EVAL TECH 0-60 MINS  10/20/2022   IR PATIENT EVAL TECH 0-60 MINS  10/27/2022   IR PATIENT EVAL TECH 0-60 MINS  10/30/2022   IR PATIENT EVAL TECH 0-60 MINS  10/23/2022   IR PATIENT EVAL TECH 0-60 MINS  11/06/2022   IR PATIENT EVAL TECH 0-60 MINS  11/13/2022   IR PATIENT EVAL TECH 0-60 MINS  12/01/2022   IR PATIENT EVAL TECH 0-60 MINS  05/03/2024   IR RADIOLOGIST EVAL & MGMT  10/02/2022   IR RADIOLOGIST EVAL & MGMT  10/13/2022   IR RADIOLOGIST EVAL & MGMT  10/14/2022   IR RADIOLOGIST EVAL & MGMT  10/17/2022   IR RADIOLOGIST EVAL & MGMT  10/20/2022   IR REMOVAL TUN ACCESS W/ PORT W/O FL MOD SED  10/13/2022   LEFT HEART CATHETERIZATION WITH CORONARY ANGIOGRAM N/A 10/30/2014   Procedure: LEFT HEART CATHETERIZATION WITH CORONARY ANGIOGRAM;  Surgeon: Dorn JINNY Lesches, MD;  Location: [facility] CATH LAB;   Service: Cardiovascular;  Laterality: N/A;   PERCUTANEOUS CORONARY STENT INTERVENTION (PCI-S)  10/30/2014   Procedure: PERCUTANEOUS CORONARY STENT INTERVENTION (PCI-S);  Surgeon: Dorn JINNY Lesches, MD;  Location: [facility] CATH LAB;  Service: Cardiovascular;;   PROSTATECTOMY     THORACENTESIS Left 12/31/2021   Procedure: THORACENTESIS;  Surgeon: Claudene Toribio BROCKS, MD;  Location: [facility] ENDOSCOPY;  Service: Pulmonary;  Laterality: Left;   TONSILLECTOMY     VIDEO ASSISTED THORACOSCOPY (VATS)/DECORTICATION Left 01/21/2022   Procedure: VIDEO ASSISTED THORACOSCOPY (VATS)/DECORTICATION;  Surgeon: Shyrl Linnie KIDD, MD;  Location: [facility] OR;  Service: Thoracic;  Laterality: Left;    REVIEW OF SYSTEMS:   Constitutional: Stable fatigue. Negative  for appetite change, chills, fever and unexpected weight change.  HENT: Negative for mouth sores, nosebleeds, sore throat and trouble swallowing.   Eyes: Negative for eye problems and icterus.  Respiratory: Stable dyspnea on exertion and occasional cough. Negative for  hemoptysis and wheezing.   Cardiovascular: Negative for chest pain and leg swelling.  Gastrointestinal: Negative for abdominal pain, constipation, diarrhea, nausea and vomiting.  Genitourinary: Negative for bladder incontinence, difficulty urinating, dysuria, frequency and hematuria.   Musculoskeletal: Negative for back pain, gait problem, neck pain and neck stiffness.  Skin: Improved rash and itching.  Neurological: Negative for dizziness, extremity weakness, gait problem, headaches, light-headedness and seizures.  Hematological: Negative for adenopathy. Does not bruise/bleed easily.  Psychiatric/Behavioral: Negative for confusion, depression and sleep disturbance. The patient is not nervous/anxious.     PHYSICAL EXAMINATION:  Blood pressure 116/66, pulse (!) 55, temperature 97.9 F (36.6 C), temperature source Tympanic, resp. rate 18, height 5' 9 (1.753 m), weight 144 lb 4.8 oz (65.5 kg), SpO2 99%.  ECOG  PERFORMANCE STATUS: 1  Physical Exam  Constitutional: Oriented to person, place, and time and well-developed, well-nourished, and in no distress.  HENT:  Head: Normocephalic and atraumatic.  Mouth/Throat: Oropharynx is clear and moist. No oropharyngeal exudate.  Eyes: Conjunctivae are normal. Right eye exhibits no discharge. Left eye exhibits no discharge. No scleral icterus.  Neck: Normal range of motion. Neck supple.  Cardiovascular: Normal rate, regular rhythm, normal heart sounds and intact distal pulses.   Pulmonary/Chest: Effort normal. Quiet breath sounds bilaterally. No respiratory distress. No wheezes. No rales.  Abdominal: Soft. Bowel sounds are normal. Exhibits no distension and no mass. There is no tenderness.  Musculoskeletal: Normal range of motion. Exhibits no edema. tenderness.  Lymphadenopathy:    No cervical adenopathy.  Neurological: Alert and oriented to person, place, and time. Exhibits normal muscle tone. Gait normal. Coordination normal.  Skin: Skin is warm and dry. Rash on forehead and right nec. Not diaphoretic. No erythema. No pallor.  Psychiatric: Mood, memory and judgment normal.  Vitals reviewed.  LABORATORY DATA: Lab Results  Component Value Date   WBC 8.4 06/20/2024   HGB 13.0 06/20/2024   HCT 39.4 06/20/2024   MCV 88.7 06/20/2024   PLT 183 06/20/2024      Chemistry      Component Value Date/Time   NA 141 06/20/2024 1055   K 4.0 06/20/2024 1055   CL 108 06/20/2024 1055   CO2 29 06/20/2024 1055   BUN 21 06/20/2024 1055   CREATININE 0.90 06/20/2024 1055      Component Value Date/Time   CALCIUM  9.1 06/20/2024 1055   ALKPHOS 74 06/20/2024 1055   AST 20 06/20/2024 1055   ALT 10 06/20/2024 1055   BILITOT 0.5 06/20/2024 1055       RADIOGRAPHIC STUDIES:  No results found.   ASSESSMENT/PLAN:  This is a very pleasant 83 year old Caucasian male diagnosed with extensive stage (T3, N2, M1 a) small cell lung cancer.  He presented with an  obstructive left upper lobe lung mass in addition to mediastinal lymphadenopathy.  He also has a malignant left pleural effusion and pleural-based metastases.  He also has a suspicious liver metastasis.  He was diagnosed in June 2023.    The patient is currently undergoing slightly reduced dose systemic chemotherapy with carboplatin  for an AUC of 4 on day 1, etoposide  80 mg per metered squared on days 1 2, and 3 with Imfinzi  on day 1 every 3 weeks.  He is status post  32 cycles.  Starting from cycle #5, the patient started maintenance immunotherapy with Imfinzi  IV every 4 weeks.    Labs were reviewed. Recommend he proceed with cycle #33 today as scheduled.    See him back for follow-up visit in 4 weeks for evaluation repeat blood work before undergoing cycle #34.    Resolved pneumonia Recent pneumonia treated with antibiotics. Symptoms resolved. Shortness of breath consistent with baseline emphysema. - Ordered scan before next appointment to monitor post-pneumonia changes. - Advised to seek earlier scan if recurrent symptoms occur.  - Continue albuterol  sulfate as needed. - Consider discussing with insurance for alternative maintenance inhalers.  The patient was advised to call immediately if he has any concerning symptoms in the interval. The patient voices understanding of current disease status and treatment options and is in agreement with the current care plan. All questions were answered. The patient knows to call the clinic with any problems, questions or concerns. We can certainly see the patient much sooner if necessary   Orders Placed This Encounter  Procedures   CT CHEST ABDOMEN PELVIS W CONTRAST    Standing Status:   Future    Expected Date:   07/11/2024    Expiration Date:   06/20/2025    If indicated for the ordered procedure, I authorize the administration of contrast media per Radiology protocol:   Yes    Does the patient have a contrast media/X-ray dye allergy?:   No     Preferred imaging location?:   [facility]    If indicated for the ordered procedure, I authorize the administration of oral contrast media per Radiology protocol:   Yes    The total time spent in the appointment was 20-29 minutes  Aubri Gathright L Ileana Chalupa, PA-C 06/20/24

## 2024-06-20 ENCOUNTER — Inpatient Hospital Stay

## 2024-06-20 ENCOUNTER — Inpatient Hospital Stay: Attending: Internal Medicine

## 2024-06-20 ENCOUNTER — Other Ambulatory Visit

## 2024-06-20 ENCOUNTER — Inpatient Hospital Stay: Attending: Internal Medicine | Admitting: Physician Assistant

## 2024-06-20 ENCOUNTER — Other Ambulatory Visit: Payer: Self-pay

## 2024-06-20 VITALS — BP 111/67 | HR 54 | Resp 16

## 2024-06-20 VITALS — BP 116/66 | HR 55 | Temp 97.9°F | Resp 18 | Ht 69.0 in | Wt 144.3 lb

## 2024-06-20 DIAGNOSIS — Z79620 Long term (current) use of immunosuppressive biologic: Secondary | ICD-10-CM | POA: Diagnosis not present

## 2024-06-20 DIAGNOSIS — C3482 Malignant neoplasm of overlapping sites of left bronchus and lung: Secondary | ICD-10-CM

## 2024-06-20 DIAGNOSIS — Z8701 Personal history of pneumonia (recurrent): Secondary | ICD-10-CM | POA: Insufficient documentation

## 2024-06-20 DIAGNOSIS — C782 Secondary malignant neoplasm of pleura: Secondary | ICD-10-CM | POA: Insufficient documentation

## 2024-06-20 DIAGNOSIS — C3412 Malignant neoplasm of upper lobe, left bronchus or lung: Secondary | ICD-10-CM | POA: Insufficient documentation

## 2024-06-20 DIAGNOSIS — E032 Hypothyroidism due to medicaments and other exogenous substances: Secondary | ICD-10-CM

## 2024-06-20 DIAGNOSIS — Z5112 Encounter for antineoplastic immunotherapy: Secondary | ICD-10-CM

## 2024-06-20 LAB — CBC WITH DIFFERENTIAL (CANCER CENTER ONLY)
Abs Immature Granulocytes: 0.02 K/uL (ref 0.00–0.07)
Basophils Absolute: 0.1 K/uL (ref 0.0–0.1)
Basophils Relative: 1 %
Eosinophils Absolute: 0.6 K/uL — ABNORMAL HIGH (ref 0.0–0.5)
Eosinophils Relative: 7 %
HCT: 39.4 % (ref 39.0–52.0)
Hemoglobin: 13 g/dL (ref 13.0–17.0)
Immature Granulocytes: 0 %
Lymphocytes Relative: 21 %
Lymphs Abs: 1.7 K/uL (ref 0.7–4.0)
MCH: 29.3 pg (ref 26.0–34.0)
MCHC: 33 g/dL (ref 30.0–36.0)
MCV: 88.7 fL (ref 80.0–100.0)
Monocytes Absolute: 0.9 K/uL (ref 0.1–1.0)
Monocytes Relative: 11 %
Neutro Abs: 5.1 K/uL (ref 1.7–7.7)
Neutrophils Relative %: 60 %
Platelet Count: 183 K/uL (ref 150–400)
RBC: 4.44 MIL/uL (ref 4.22–5.81)
RDW: 13.7 % (ref 11.5–15.5)
WBC Count: 8.4 K/uL (ref 4.0–10.5)
nRBC: 0 % (ref 0.0–0.2)

## 2024-06-20 LAB — CMP (CANCER CENTER ONLY)
ALT: 10 U/L (ref 0–44)
AST: 20 U/L (ref 15–41)
Albumin: 3.7 g/dL (ref 3.5–5.0)
Alkaline Phosphatase: 74 U/L (ref 38–126)
Anion gap: 4 — ABNORMAL LOW (ref 5–15)
BUN: 21 mg/dL (ref 8–23)
CO2: 29 mmol/L (ref 22–32)
Calcium: 9.1 mg/dL (ref 8.9–10.3)
Chloride: 108 mmol/L (ref 98–111)
Creatinine: 0.9 mg/dL (ref 0.61–1.24)
GFR, Estimated: >60 mL/min (ref >60)
Glucose, Bld: 79 mg/dL (ref 70–99)
Potassium: 4 mmol/L (ref 3.5–5.1)
Sodium: 141 mmol/L (ref 135–145)
Total Bilirubin: 0.5 mg/dL (ref 0.0–1.2)
Total Protein: 6.6 g/dL (ref 6.5–8.1)

## 2024-06-20 LAB — TSH: TSH: 5.78 u[IU]/mL — ABNORMAL HIGH (ref 0.350–4.500)

## 2024-06-20 MED ORDER — SODIUM CHLORIDE 0.9 % IV SOLN
1500.0000 mg | Freq: Once | INTRAVENOUS | Status: AC
  Administered 2024-06-20: 1500 mg via INTRAVENOUS
  Filled 2024-06-20: qty 30

## 2024-06-20 MED ORDER — SODIUM CHLORIDE 0.9% FLUSH: 10.0000 mL | INTRAVENOUS | Status: DC | PRN

## 2024-06-20 MED ORDER — SODIUM CHLORIDE 0.9 % IV SOLN: Freq: Once | INTRAVENOUS | Status: AC

## 2024-06-20 NOTE — Patient Instructions (Signed)

## 2024-06-28 ENCOUNTER — Telehealth: Payer: Self-pay | Admitting: Cardiovascular Disease

## 2024-06-28 NOTE — Telephone Encounter (Signed)
*  STAT* If patient is at the pharmacy, call can be transferred to refill team.   1. Which medications need to be refilled? (please list name of each medication and dose if known) carvedilol  (COREG ) 3.125 MG tablet   2. Which pharmacy/location (including street and city if local pharmacy) is medication to be sent to? [id number] - [place], [place] - [address] AT [facility] OF [address]   3. Do they need a 30 day or 90 day supply? 90

## 2024-06-29 MED ORDER — CARVEDILOL 3.125 MG PO TABS: 3.1250 mg | ORAL_TABLET | Freq: Two times a day (BID) | ORAL | 2 refills | Status: AC

## 2024-06-29 NOTE — Telephone Encounter (Signed)
 Refill sent.

## 2024-07-04 ENCOUNTER — Other Ambulatory Visit: Payer: Self-pay

## 2024-07-06 ENCOUNTER — Ambulatory Visit (HOSPITAL_BASED_OUTPATIENT_CLINIC_OR_DEPARTMENT_OTHER)
Admission: RE | Admit: 2024-07-06 | Discharge: 2024-07-06 | Disposition: A | Discharge status: Home / Self Care | Source: Ambulatory Visit | Attending: Physician Assistant | Admitting: Physician Assistant

## 2024-07-06 DIAGNOSIS — N200 Calculus of kidney: Secondary | ICD-10-CM | POA: Diagnosis not present

## 2024-07-06 DIAGNOSIS — C3490 Malignant neoplasm of unspecified part of unspecified bronchus or lung: Secondary | ICD-10-CM | POA: Diagnosis not present

## 2024-07-06 DIAGNOSIS — K429 Umbilical hernia without obstruction or gangrene: Secondary | ICD-10-CM | POA: Diagnosis not present

## 2024-07-06 DIAGNOSIS — C3482 Malignant neoplasm of overlapping sites of left bronchus and lung: Secondary | ICD-10-CM | POA: Insufficient documentation

## 2024-07-06 DIAGNOSIS — R918 Other nonspecific abnormal finding of lung field: Secondary | ICD-10-CM | POA: Diagnosis not present

## 2024-07-06 MED ORDER — HEPARIN SOD (PORK) LOCK FLUSH 100 UNIT/ML IV SOLN
500.0000 [IU] | Freq: Once | INTRAVENOUS | Status: AC
  Administered 2024-07-06: 500 [IU] via INTRAVENOUS

## 2024-07-06 MED ORDER — IOHEXOL 300 MG/ML  SOLN
100.0000 mL | Freq: Once | INTRAMUSCULAR | Status: AC | PRN
  Administered 2024-07-06: 100 mL via INTRAVENOUS

## 2024-07-11 NOTE — Progress Notes (Signed)
 [facility] OFFICE PROGRESS NOTE  Pedro Rush, MD [address] [place] [address]  DIAGNOSIS: Extensive stage (T3, N2, M1b) small cell lung cancer presented with obstructive left upper lobe lung mass in addition to mediastinal lymphadenopathy and malignant left pleural effusion as well as pleural-based metastasis as well as suspicious liver metastasis diagnosed in June 2023   PRIOR THERAPY:  Palliative radiotherapy to enlarging gastrohepatic ligament lymph node under Pedro care of Dr. Shannon completed January 8th, 2025.     CURRENT THERAPY: Palliative systemic chemotherapy with carboplatin  initiated for AUC of 4 on day 1 and etoposide  80 Mg/M2 on days 1, 2 and 3 with Cosela  before Pedro chemotherapy and Imfinzi  1500 Mg IV on day 1 every 3 weeks.  Status post 33 cycles.  Starting from cycle #5 Pedro patient will be on maintenance treatment with Imfinzi  1500 Mg IV every 4 weeks.   INTERVAL HISTORY: Pedro Pope. 83 y.o. male returns to Pedro clinic today for a follow-up visit accompanied by his wife. Pedro patient was last seen by myself 4 weeks ago.   His BP was low today at Pedro beginning of Pedro visit. He is asymptomatic. He did not eat breakfast today. He did have a few episodes of significant fatigue and needing to rest though. He is not currently experiencing this. He denies lightheadedness or dizziness. He takes coreg  once a day. He last saw cardiology in August and sees them annually. His last echo was in December 2024.     He occasionally experiences shortness of breath, sometimes requiring him to lay down. He uses an albuterol  inhaler as needed. He reports coughing up mucus, with certain foods like cereal with [employer] milk and fudge bars exacerbating Pedro cough. Switching to lactate milk has reduced Pedro coughing. Overall, he states this is not worse compared to his baseline.   He has had several recent episodes of epistaxis, which are atypical for him. He denies being on  blood thinners or aspirin . Blowing his nose sometimes triggers bleeding.  No fevers, chills, night sweats, nausea, vomiting, diarrhea, constipation, or chest pain. His appetite is good, and he ate well during Thanksgiving. No recent rashes or changes in his cough since Pedro last visit.  He recently had a restaging CT scan.    He is here today for evaluation and repeat blood work before undergoing cycle #34   MEDICAL HISTORY: Past Medical History:  Diagnosis Date   Anosmia    CAD (coronary artery disease), native coronary artery    a. 10/29/2013 antlat STEMI s/p DES to pLAD; residual 80 % proximal and 60% mid LCx diease   Chronic headaches    Dressler's syndrome (HCC)    a. placed on colchicine     ED (erectile dysfunction)    Elevated TSH    Fatty liver    Gout    Ischemic cardiomyopathy    a. 2D ECHO 11/04/14 w/ EF 40-45% with LV WMA, G2DD, mild MR, mild LA dilation, mod TR.   PAF (paroxysmal atrial fibrillation) (HCC)    a. Dx on 11/03/14 admission. Not placed on longterm AC due to DAPT w/ receent DES. Will plan for outpt heart monitor    Pre-diabetes    a. HgA1c 6.1 in 10/2014   Prostate cancer [facility])    s/p prostatectomy and XRT   Rheumatic fever [birth year]18/1945   Small cell lung cancer (HCC) 01/2022    ALLERGIES:  is allergic to penicillins, clindamycin, corn syrup [glucose], and aspirin  adult low [  aspirin ].  MEDICATIONS:  Current Outpatient Medications  Medication Sig Dispense Refill   omeprazole (PRILOSEC) 20 MG capsule Take 1 capsule (20 mg total) by mouth daily. 30 capsule 1   acetaminophen  (TYLENOL ) 500 MG tablet Take 500 mg by mouth every 6 (six) hours as needed for mild pain or moderate pain.     albuterol  (VENTOLIN  HFA) 108 (90 Base) MCG/ACT inhaler Inhale 1-2 puffs into Pedro lungs every 6 (six) hours as needed. 8 g 2   ascorbic acid  (VITAMIN C ) 500 MG tablet Take 500 mg by mouth 2 (two) times daily.     Blood Pressure Monitor KIT For daily blood pressure monitoring;  Arm  Cuff 1 each 0   carvedilol  (COREG ) 3.125 MG tablet Take 1 tablet (3.125 mg total) by mouth 2 (two) times daily with a meal. 180 tablet 2   doxycycline  (VIBRA -TABS) 100 MG tablet Take 1 tablet (100 mg total) by mouth 2 (two) times daily. 14 tablet 0   escitalopram (LEXAPRO) 5 MG tablet Take 5 mg by mouth daily.     lidocaine -prilocaine  (EMLA ) cream Apply 1 Application topically as needed (apply 1-1.5 hours prior to port flush with lab appointment.). 30 g 2   PROCTO-MED HC  2.5 % rectal cream Apply topically.     triamcinolone  cream (KENALOG ) 0.1 % Apply 1 Application topically 2 (two) times daily. 80 g 0   No current facility-administered medications for this visit.   Facility-Administered Medications Ordered in Other Visits  Medication Dose Route Frequency Provider Last Rate Last Admin   sodium chloride  flush (NS) 0.9 % injection 10 mL  10 mL Intracatheter Once Sherrod Sherrod, MD        SURGICAL HISTORY:  Past Surgical History:  Procedure Laterality Date   CORONARY ANGIOPLASTY WITH STENT PLACEMENT  10/30/14   STEMI s/p DES to proximal LAD; residual 80 % proximal and 60% mid LCx diease   IR IMAGING GUIDED PORT INSERTION  03/12/2022   IR IMAGING GUIDED PORT INSERTION  04/19/2024   IR PATIENT EVAL TECH 0-60 MINS  10/20/2022   IR PATIENT EVAL TECH 0-60 MINS  10/27/2022   IR PATIENT EVAL TECH 0-60 MINS  10/30/2022   IR PATIENT EVAL TECH 0-60 MINS  10/23/2022   IR PATIENT EVAL TECH 0-60 MINS  11/06/2022   IR PATIENT EVAL TECH 0-60 MINS  11/13/2022   IR PATIENT EVAL TECH 0-60 MINS  12/01/2022   IR PATIENT EVAL TECH 0-60 MINS  05/03/2024   IR RADIOLOGIST EVAL & MGMT  10/02/2022   IR RADIOLOGIST EVAL & MGMT  10/13/2022   IR RADIOLOGIST EVAL & MGMT  10/14/2022   IR RADIOLOGIST EVAL & MGMT  10/17/2022   IR RADIOLOGIST EVAL & MGMT  10/20/2022   IR REMOVAL TUN ACCESS W/ PORT W/O FL MOD SED  10/13/2022   LEFT HEART CATHETERIZATION WITH CORONARY ANGIOGRAM N/A 10/30/2014   Procedure: LEFT HEART CATHETERIZATION WITH  CORONARY ANGIOGRAM;  Surgeon: Dorn JINNY Lesches, MD;  Location: [facility] CATH LAB;  Service: Cardiovascular;  Laterality: N/A;   PERCUTANEOUS CORONARY STENT INTERVENTION (PCI-S)  10/30/2014   Procedure: PERCUTANEOUS CORONARY STENT INTERVENTION (PCI-S);  Surgeon: Dorn JINNY Lesches, MD;  Location: [facility] CATH LAB;  Service: Cardiovascular;;   PROSTATECTOMY     THORACENTESIS Left 12/31/2021   Procedure: THORACENTESIS;  Surgeon: Claudene Toribio BROCKS, MD;  Location: [facility] ENDOSCOPY;  Service: Pulmonary;  Laterality: Left;   TONSILLECTOMY     VIDEO ASSISTED THORACOSCOPY (VATS)/DECORTICATION Left 01/21/2022   Procedure: VIDEO ASSISTED THORACOSCOPY (VATS)/DECORTICATION;  Surgeon: Shyrl Linnie KIDD, MD;  Location: [facility] OR;  Service: Thoracic;  Laterality: Left;    REVIEW OF SYSTEMS:   Constitutional: Stable fatigue. Negative for appetite change, chills, fever and unexpected weight change.  HENT: Negative for mouth sores, nosebleeds, sore throat and trouble swallowing.   Eyes: Negative for eye problems and icterus.  Respiratory: Stable dyspnea on exertion and occasional cough. Negative for  hemoptysis and wheezing.   Cardiovascular: Negative for chest pain and leg swelling.  Gastrointestinal: Negative for abdominal pain, constipation, diarrhea, nausea and vomiting.  Genitourinary: Negative for bladder incontinence, difficulty urinating, dysuria, frequency and hematuria.   Musculoskeletal: Negative for back pain, gait problem, neck pain and neck stiffness.  Skin: Improved rash and itching.  Neurological: Negative for dizziness, extremity weakness, gait problem, headaches, light-headedness and seizures.  Hematological: Negative for adenopathy. Does not bruise/bleed easily.  Psychiatric/Behavioral: Negative for confusion, depression and sleep disturbance. Pedro patient is not nervous/anxious.     PHYSICAL EXAMINATION:  Blood pressure (!) 112/52, pulse 70, temperature 97.9 F (36.6 C), temperature source Temporal, resp. rate  17, height 5' 9 (1.753 m), weight 146 lb (66.2 kg), SpO2 95%.  ECOG PERFORMANCE STATUS: 1  Physical Exam  Constitutional: Oriented to person, place, and time and well-developed, well-nourished, and in no distress.  HENT:  Head: Normocephalic and atraumatic.  Mouth/Throat: Oropharynx is clear and moist. No oropharyngeal exudate.  Eyes: Conjunctivae are normal. Right eye exhibits no discharge. Left eye exhibits no discharge. No scleral icterus.  Neck: Normal range of motion. Neck supple.  Cardiovascular: Normal rate, regular rhythm, normal heart sounds and intact distal pulses.   Pulmonary/Chest: Effort normal. Quiet breath sounds bilaterally. No respiratory distress. No wheezes. No rales.  Abdominal: Soft. Bowel sounds are normal. Exhibits no distension and no mass. There is no tenderness.  Musculoskeletal: Normal range of motion. Exhibits no edema. tenderness.  Lymphadenopathy:    No cervical adenopathy.  Neurological: Alert and oriented to person, place, and time. Exhibits normal muscle tone. Gait normal. Coordination normal.  Skin: Skin is warm and dry. Rash on forehead and right nec. Not diaphoretic. No erythema. No pallor.  Psychiatric: Mood, memory and judgment normal.  Vitals reviewed.  LABORATORY DATA: Lab Results  Component Value Date   WBC 8.1 07/18/2024   HGB 12.7 (L) 07/18/2024   HCT 38.7 (L) 07/18/2024   MCV 89.6 07/18/2024   PLT 174 07/18/2024      Chemistry      Component Value Date/Time   NA 144 07/18/2024 0956   K 3.9 07/18/2024 0956   CL 110 07/18/2024 0956   CO2 26 07/18/2024 0956   BUN 19 07/18/2024 0956   CREATININE 0.85 07/18/2024 0956      Component Value Date/Time   CALCIUM  8.9 07/18/2024 0956   ALKPHOS 80 07/18/2024 0956   AST 23 07/18/2024 0956   ALT 11 07/18/2024 0956   BILITOT 0.4 07/18/2024 0956       RADIOGRAPHIC STUDIES:  CT CHEST ABDOMEN PELVIS W CONTRAST Result Date: 07/11/2024 CLINICAL DATA:  Small cell lung cancer, assess  treatment response. * Tracking Code: BO * EXAM: CT CHEST, ABDOMEN, AND PELVIS WITH CONTRAST TECHNIQUE: Multidetector CT imaging of Pedro chest, abdomen and pelvis was performed following Pedro standard protocol during bolus administration of intravenous contrast. RADIATION DOSE REDUCTION: This exam was performed according to Pedro departmental dose-optimization program which includes automated exposure control, adjustment of the mA and/or kV according to patient size and/or use of iterative reconstruction technique. CONTRAST:  100mL  OMNIPAQUE  IOHEXOL  300 MG/ML  SOLN COMPARISON:  Prior CTs 04/19/2024 and 01/22/2024 FINDINGS: CT CHEST FINDINGS Cardiovascular: No acute vascular findings. Right IJ Port-A-Cath extends to Pedro lower SVC level. Atherosclerosis of Pedro aorta, great vessels and coronary arteries with calcifications of Pedro aortic valve and mitral annulus. Pedro heart size is normal. There is no pericardial effusion. Mediastinum/Nodes: There are no enlarged mediastinal, hilar or axillary lymph nodes. Small mediastinal lymph nodes are unchanged. Stable small hiatal hernia. Lungs/Pleura: No pleural effusion or pneumothorax. Underlying chronic lung disease again demonstrated with reticulation, architectural distortion and diffuse central airway thickening consistent with UIP. Mild superimposed centrilobular emphysema. Scattered small pulmonary nodules appear unchanged, including a subpleural lingular nodule measuring 7 x 5 mm on image 127/3 and left lower lobe nodules measuring 4 and 6 mm respectively on image 120/3. No confluent airspace disease or enlarging nodules demonstrated. Musculoskeletal/Chest wall: No chest wall mass or suspicious osseous findings. Mild spondylosis. Asymmetric glenohumeral degenerative changes on Pedro right. CT ABDOMEN AND PELVIS FINDINGS Hepatobiliary: Pedro liver is normal in density without suspicious focal abnormality. No evidence of gallstones, gallbladder wall thickening or biliary dilatation.  Pancreas: Unremarkable. No pancreatic ductal dilatation or surrounding inflammatory changes. Spleen: Normal in size without focal abnormality. Adrenals/Urinary Tract: Both adrenal glands appear normal. Stable nonobstructing 5 mm calculus in Pedro lower pole of Pedro right kidney. No evidence of ureteral calculus, hydronephrosis or suspicious renal lesion. Mild nonspecific diffuse bladder wall thickening, similar to previous study. Stomach/Bowel: No enteric contrast administered. Pedro stomach appears unremarkable for its degree of distension. No evidence of bowel wall thickening, distention or surrounding inflammatory change. Pedro appendix appears normal. Vascular/Lymphatic: There are no enlarged abdominal or pelvic lymph nodes. Previous pelvic lymphadenectomy. Diffuse aortic and branch vessel atherosclerosis with chronically displaced intimal calcifications. No evidence of aneurysm or large vessel occlusion. Reproductive: Status post prostatectomy. Other: Stable small umbilical hernia containing only fat. No ascites, peritoneal nodularity or pneumoperitoneum. Musculoskeletal: No acute or significant osseous findings. Lower lumbar spondylosis. Mild degenerative changes of both hips. IMPRESSION: 1. Stable CT's of Pedro chest, abdomen and pelvis. No evidence of local recurrence or metastatic disease. 2. Stable small pulmonary nodules, likely benign. 3. Stable chronic lung disease consistent with UIP. 4. Nonobstructing right renal calculus. 5. Aortic Atherosclerosis (ICD10-I70.0) and Emphysema (ICD10-J43.9). Electronically Signed   By: Elsie Perone M.D.   On: 07/11/2024 10:20     ASSESSMENT/PLAN:  This is a very pleasant 83 year old Caucasian male diagnosed with extensive stage (T3, N2, M1 a) small cell lung cancer.  He presented with an obstructive left upper lobe lung mass in addition to mediastinal lymphadenopathy.  He also has a malignant left pleural effusion and pleural-based metastases.  He also has a suspicious  liver metastasis.  He was diagnosed in June 2023.    Pedro patient is currently undergoing slightly reduced dose systemic chemotherapy with carboplatin  for an AUC of 4 on day 1, etoposide  80 mg per metered squared on days 1 2, and 3 with Imfinzi  on day 1 every 3 weeks.  He is status post 33 cycles.  Starting from cycle #5, Pedro patient started maintenance immunotherapy with Imfinzi  IV every 4 weeks.    Labs were reviewed. Recommend he proceed with cycle #34 today as scheduled.   Pedro patient was seen with Dr. Sherrod today.  Dr. Sherrod personally and independently reviewed Pedro scan and discussed results with Pedro patient today.  Pedro scan showed no evidence of disease progression.  Dr. Sherrod recommends he continue on Pedro  same treatment at Pedro same dose.    See him back for follow-up visit in 4 weeks for evaluation repeat blood work before undergoing cycle #35.    Epistaxis Intermittent epistaxis, possibly due to dry nasal passages and blowing of Pedro nose. No blood thinners or aspirin  use. - Use saline spray in nasal passages twice daily. - Avoid forceful nose blowing. - Consider Afrin for active bleeding, but not for regular use. - Monitor for persistent epistaxis and consider ENT referral if it continues.  Gastroesophageal reflux disease Coughing episodes with certain foods, improved with lactose-free milk. Possible reflux-related cough. - Start trial of Prilosec (omeprazole) daily. - Avoid reclining for at least two hours after eating. - Consider using saline spray to keep nasal passages moist.   His BP initially was 74/37. He had not eaten today. His BP was rechecked after eating and improved to 112/52.   - Monitor blood pressure at home and keep a log.  - Ensure blood pressure remains above 100 systolic; seek medical attention if below 90 systolic.   - Encourage adequate hydration and nutrition. - Follow up with cardiologist in six months or sooner if concerns related to his BP and  heart.   Pedro patient was advised to call immediately if he has any concerning symptoms in Pedro interval. Pedro patient voices understanding of current disease status and treatment options and is in agreement with Pedro current care plan. All questions were answered. Pedro patient knows to call Pedro clinic with any problems, questions or concerns. We can certainly see Pedro patient much sooner if necessary   No orders of Pedro defined types were placed in this encounter.    Westin Knotts L Azel Gumina, PA-C 07/18/24  ADDENDUM: Hematology/oncology Attending: I had a face-to-face encounter with Pedro patient today.  I reviewed his record, lab, scan and recommended his care plan.  This is a very pleasant 83 years old white male with extensive stage small cell lung cancer diagnosed in June 2023 and currently undergoing systemic treatment initially with carboplatin , etoposide  and durvalumab  every 3 weeks for 4 cycles and currently on maintenance treatment with single agent durvalumab  every 4 weeks status post 33 cycles.  Pedro patient has been tolerating his treatment well with no concerning adverse effects.  He is not feeling well today because he did not eat his breakfast and he took his blood pressure medication.  Initial blood pressure reading was 74/37 but on repeat after hydration and drinking some fluid was up to 112/52. Pedro patient had repeat CT scan of Pedro chest, abdomen and pelvis performed recently.  I personally independently reviewed Pedro scan and discussed Pedro result with Pedro patient and his wife today.  His scan showed no concerning findings for disease progression. I recommended for him to continue his current treatment with maintenance durvalumab  and he will proceed with cycle #34 today. Pedro patient will come back for follow-up visit in 4 weeks for evaluation before Pedro next cycle of his treatment. He was advised to call immediately if he has any other concerning symptoms in Pedro interval. Disclaimer:  This note was dictated with voice recognition software. Similar sounding words can inadvertently be transcribed and may be missed upon review. Sherrod MARLA Sherrod, MD

## 2024-07-13 ENCOUNTER — Other Ambulatory Visit: Payer: Self-pay

## 2024-07-14 ENCOUNTER — Other Ambulatory Visit: Payer: Self-pay

## 2024-07-18 ENCOUNTER — Other Ambulatory Visit: Payer: Self-pay

## 2024-07-18 ENCOUNTER — Ambulatory Visit: Admitting: Hematology and Oncology

## 2024-07-18 ENCOUNTER — Inpatient Hospital Stay: Attending: Internal Medicine

## 2024-07-18 ENCOUNTER — Inpatient Hospital Stay

## 2024-07-18 ENCOUNTER — Other Ambulatory Visit: Payer: Self-pay | Admitting: Physician Assistant

## 2024-07-18 ENCOUNTER — Other Ambulatory Visit

## 2024-07-18 ENCOUNTER — Inpatient Hospital Stay: Attending: Internal Medicine | Admitting: Physician Assistant

## 2024-07-18 VITALS — BP 119/59 | HR 53 | Temp 97.7°F | Resp 17

## 2024-07-18 VITALS — BP 112/52 | HR 70 | Temp 97.9°F | Resp 17 | Ht 69.0 in | Wt 146.0 lb

## 2024-07-18 DIAGNOSIS — K219 Gastro-esophageal reflux disease without esophagitis: Secondary | ICD-10-CM | POA: Diagnosis not present

## 2024-07-18 DIAGNOSIS — C3482 Malignant neoplasm of overlapping sites of left bronchus and lung: Secondary | ICD-10-CM

## 2024-07-18 DIAGNOSIS — C3412 Malignant neoplasm of upper lobe, left bronchus or lung: Secondary | ICD-10-CM | POA: Diagnosis present

## 2024-07-18 DIAGNOSIS — E039 Hypothyroidism, unspecified: Secondary | ICD-10-CM

## 2024-07-18 DIAGNOSIS — Z79620 Long term (current) use of immunosuppressive biologic: Secondary | ICD-10-CM | POA: Diagnosis not present

## 2024-07-18 DIAGNOSIS — Z5112 Encounter for antineoplastic immunotherapy: Secondary | ICD-10-CM | POA: Insufficient documentation

## 2024-07-18 DIAGNOSIS — R040 Epistaxis: Secondary | ICD-10-CM | POA: Diagnosis not present

## 2024-07-18 DIAGNOSIS — E032 Hypothyroidism due to medicaments and other exogenous substances: Secondary | ICD-10-CM

## 2024-07-18 DIAGNOSIS — J910 Malignant pleural effusion: Secondary | ICD-10-CM | POA: Diagnosis not present

## 2024-07-18 DIAGNOSIS — I959 Hypotension, unspecified: Secondary | ICD-10-CM

## 2024-07-18 LAB — CBC WITH DIFFERENTIAL (CANCER CENTER ONLY)
Abs Immature Granulocytes: 0.02 K/uL (ref 0.00–0.07)
Basophils Absolute: 0 K/uL (ref 0.0–0.1)
Basophils Relative: 0 %
Eosinophils Absolute: 0.5 K/uL (ref 0.0–0.5)
Eosinophils Relative: 6 %
HCT: 38.7 % — ABNORMAL LOW (ref 39.0–52.0)
Hemoglobin: 12.7 g/dL — ABNORMAL LOW (ref 13.0–17.0)
Immature Granulocytes: 0 %
Lymphocytes Relative: 17 %
Lymphs Abs: 1.4 K/uL (ref 0.7–4.0)
MCH: 29.4 pg (ref 26.0–34.0)
MCHC: 32.8 g/dL (ref 30.0–36.0)
MCV: 89.6 fL (ref 80.0–100.0)
Monocytes Absolute: 0.8 K/uL (ref 0.1–1.0)
Monocytes Relative: 10 %
Neutro Abs: 5.3 K/uL (ref 1.7–7.7)
Neutrophils Relative %: 67 %
Platelet Count: 174 K/uL (ref 150–400)
RBC: 4.32 MIL/uL (ref 4.22–5.81)
RDW: 14.3 % (ref 11.5–15.5)
WBC Count: 8.1 K/uL (ref 4.0–10.5)
nRBC: 0 % (ref 0.0–0.2)

## 2024-07-18 LAB — CMP (CANCER CENTER ONLY)
ALT: 11 U/L (ref 0–44)
AST: 23 U/L (ref 15–41)
Albumin: 3.9 g/dL (ref 3.5–5.0)
Alkaline Phosphatase: 80 U/L (ref 38–126)
Anion gap: 9 (ref 5–15)
BUN: 19 mg/dL (ref 8–23)
CO2: 26 mmol/L (ref 22–32)
Calcium: 8.9 mg/dL (ref 8.9–10.3)
Chloride: 110 mmol/L (ref 98–111)
Creatinine: 0.85 mg/dL (ref 0.61–1.24)
GFR, Estimated: >60 mL/min (ref >60)
Glucose, Bld: 101 mg/dL — ABNORMAL HIGH (ref 70–99)
Potassium: 3.9 mmol/L (ref 3.5–5.1)
Sodium: 144 mmol/L (ref 135–145)
Total Bilirubin: 0.4 mg/dL (ref 0.0–1.2)
Total Protein: 6.6 g/dL (ref 6.5–8.1)

## 2024-07-18 LAB — TSH: TSH: 6.17 u[IU]/mL — ABNORMAL HIGH (ref 0.350–4.500)

## 2024-07-18 MED ORDER — SODIUM CHLORIDE 0.9 % IV SOLN
1500.0000 mg | Freq: Once | INTRAVENOUS | Status: AC
  Administered 2024-07-18: 1500 mg via INTRAVENOUS
  Filled 2024-07-18: qty 30

## 2024-07-18 MED ORDER — LEVOTHYROXINE SODIUM 25 MCG PO TABS: 25.0000 ug | ORAL_TABLET | Freq: Every day | ORAL | 2 refills | Status: DC

## 2024-07-18 MED ORDER — OMEPRAZOLE 20 MG PO CPDR: 20.0000 mg | DELAYED_RELEASE_CAPSULE | Freq: Every day | ORAL | 1 refills | Status: DC

## 2024-07-18 MED ORDER — SODIUM CHLORIDE 0.9 % IV SOLN: Freq: Once | INTRAVENOUS | Status: AC

## 2024-07-18 NOTE — Patient Instructions (Signed)
 [facility] - A DEPT OF [facility]  Discharge Instructions: Thank you for choosing [facility] to provide your oncology and hematology care.   If you have a lab appointment with the Cancer Center, please go directly to the Cancer Center and check in at the registration area.   Wear comfortable clothing and clothing appropriate for easy access to any Portacath or PICC line.   We strive to give you quality time with your provider. You may need to reschedule your appointment if you arrive late (15 or more minutes).  Arriving late affects you and other patients whose appointments are after yours.  Also, if you miss three or more appointments without notifying the office, you may be dismissed from the clinic at the provider's discretion.      For prescription refill requests, have your pharmacy contact our office and allow 72 hours for refills to be completed.    Today you received the following chemotherapy and/or immunotherapy agents: Durvalumab       To help prevent nausea and vomiting after your treatment, we encourage you to take your nausea medication as directed.  BELOW ARE SYMPTOMS THAT SHOULD BE REPORTED IMMEDIATELY: *FEVER GREATER THAN 100.4 F (38 C) OR HIGHER *CHILLS OR SWEATING *NAUSEA AND VOMITING THAT IS NOT CONTROLLED WITH YOUR NAUSEA MEDICATION *UNUSUAL SHORTNESS OF BREATH *UNUSUAL BRUISING OR BLEEDING *URINARY PROBLEMS (pain or burning when urinating, or frequent urination) *BOWEL PROBLEMS (unusual diarrhea, constipation, pain near the anus) TENDERNESS IN MOUTH AND THROAT WITH OR WITHOUT PRESENCE OF ULCERS (sore throat, sores in mouth, or a toothache) UNUSUAL RASH, SWELLING OR PAIN  UNUSUAL VAGINAL DISCHARGE OR ITCHING   Items with * indicate a potential emergency and should be followed up as soon as possible or go to the Emergency Department if any problems should occur.  Please show the CHEMOTHERAPY ALERT CARD or  IMMUNOTHERAPY ALERT CARD at check-in to the Emergency Department and triage nurse.  Should you have questions after your visit or need to cancel or reschedule your appointment, please contact [facility] - A DEPT OF [facility]  Dept: [phone]  and follow the prompts.  Office hours are 8:00 a.m. to 4:30 p.m. Monday - Friday. Please note that voicemails left after 4:00 p.m. may not be returned until the following business day.  We are closed weekends and major holidays. You have access to a nurse at all times for urgent questions. Please call the main number to the clinic Dept: [phone] and follow the prompts.   For any non-urgent questions, you may also contact your provider using MyChart. We now offer e-Visits for anyone 18 and older to request care online for non-urgent symptoms. For details visit [email or web address].   Also download the MyChart app! Go to the app store, search "MyChart", open the app, select [facility], and log in with your MyChart username and password.

## 2024-07-19 ENCOUNTER — Telehealth: Payer: Self-pay

## 2024-07-19 ENCOUNTER — Other Ambulatory Visit: Payer: Self-pay | Admitting: Physician Assistant

## 2024-07-19 ENCOUNTER — Encounter: Payer: Self-pay | Admitting: Internal Medicine

## 2024-07-19 DIAGNOSIS — C3482 Malignant neoplasm of overlapping sites of left bronchus and lung: Secondary | ICD-10-CM

## 2024-07-19 NOTE — Telephone Encounter (Signed)
 Spoke with patient in regards to lab results.  Per Cassie, PA, patients TSH level is high.  Synthroid sent to pharmacy.  Patient is aware.

## 2024-07-22 ENCOUNTER — Other Ambulatory Visit: Payer: Self-pay

## 2024-07-27 ENCOUNTER — Telehealth: Payer: Self-pay | Admitting: Cardiovascular Disease

## 2024-07-27 NOTE — Telephone Encounter (Signed)
 Pt c/o medication issue:  1. Name of Medication: nitroGLYCERIN  (NITROSTAT ) 0.4 MG SL tablet   2. How are you currently taking this medication (dosage and times per day)?    3. Are you having a reaction (difficulty breathing--STAT)? no  4. What is your medication issue? Patient was calling to get as refill but medication is not listed under current medication. Please advise

## 2024-07-28 MED ORDER — NITROGLYCERIN 0.4 MG SL SUBL: 0.4000 mg | SUBLINGUAL_TABLET | SUBLINGUAL | 3 refills | Status: AC | PRN

## 2024-07-28 NOTE — Telephone Encounter (Signed)
 NTG removed from med list in 2024 (stating patient preference). Patient denies any recent episodes of chest pain, but would like to have SL NTG on hand if he were ever to have an episode of chest pain.  Will forward to Dr. Court for new Rx to be sent to [facility] pharmacy.

## 2024-07-28 NOTE — Telephone Encounter (Signed)
 Court Dorn PARAS, MD to [facility] Triage (Selected Message)   07/28/24  3:47 PM Fine with me   Rx sent to [facility]

## 2024-08-08 NOTE — Progress Notes (Signed)
 [facility] OFFICE PROGRESS NOTE  [name], MD [address] [place] [address]  DIAGNOSIS: Extensive stage (T3, N2, M1b) small cell lung cancer presented with obstructive left upper lobe lung mass in addition to mediastinal lymphadenopathy and malignant left pleural effusion as well as pleural-based metastasis as well as suspicious liver metastasis diagnosed in June 2023   PRIOR THERAPY:  Palliative radiotherapy to enlarging gastrohepatic ligament lymph node under the care of Dr. Shannon completed January 8th, 2025.     CURRENT THERAPY: Palliative systemic chemotherapy with carboplatin  initiated for AUC of 4 on day 1 and etoposide  80 Mg/M2 on days 1, 2 and 3 with Cosela  before the chemotherapy and Imfinzi  1500 Mg IV on day 1 every 3 weeks.  Status post 34 cycles.  Starting from cycle #5 the patient will be on maintenance treatment with Imfinzi  1500 Mg IV every 4 weeks.   INTERVAL HISTORY: Kindrick Lankford. 83 y.o. male returns to the clinic today for a follow-up visit accompanied by his wife. The patient was last seen by myself 4 weeks ago.   He reports no new symptoms since his last visit. He denies fever, chills, night sweats, cough, gastrointestinal symptoms, rashes, headaches, or visual disturbances. He has not experienced edema or lightheadedness.  He describes a single episode of transient dyspnea yesterday, which he associates with overeating and gastroesophageal reflux. This was accompanied by chest discomfort and burning, both of which resolved with omeprazole . He finds omeprazole  effective for reflux symptoms.  He has been on low-dose thyroid  replacement for one month and notes improved concentration and cognitive function since initiation.   His blood pressure tends to run low, but he has not reported symptoms related to this. He remains active in social settings and is vigilant about infection prevention, consistently wearing a mask in public.  He is here  today for evaluation and repeat blood work before undergoing cycle #35   MEDICAL HISTORY: Past Medical History:  Diagnosis Date   Anosmia    CAD (coronary artery disease), native coronary artery    a. 10/29/2013 antlat STEMI s/p DES to pLAD; residual 80 % proximal and 60% mid LCx diease   Chronic headaches    Dressler's syndrome (HCC)    a. placed on colchicine     ED (erectile dysfunction)    Elevated TSH    Fatty liver    Gout    Ischemic cardiomyopathy    a. 2D ECHO 11/04/14 w/ EF 40-45% with LV WMA, G2DD, mild MR, mild LA dilation, mod TR.   PAF (paroxysmal atrial fibrillation) (HCC)    a. Dx on 11/03/14 admission. Not placed on longterm AC due to DAPT w/ receent DES. Will plan for outpt heart monitor    Pre-diabetes    a. HgA1c 6.1 in 10/2014   Prostate cancer [facility])    s/p prostatectomy and XRT   Rheumatic fever 08/18/1945   Small cell lung cancer (HCC) 01/2022    ALLERGIES:  is allergic to penicillins, clindamycin, corn syrup [glucose], and aspirin  adult low [aspirin ].  MEDICATIONS:  Current Outpatient Medications  Medication Sig Dispense Refill   acetaminophen  (TYLENOL ) 500 MG tablet Take 500 mg by mouth every 6 (six) hours as needed for mild pain or moderate pain.     albuterol  (VENTOLIN  HFA) 108 (90 Base) MCG/ACT inhaler Inhale 1-2 puffs into the lungs every 6 (six) hours as needed. 8 g 2   ascorbic acid  (VITAMIN C ) 500 MG tablet Take 500 mg by mouth 2 (  two) times daily.     Blood Pressure Monitor KIT For daily blood pressure monitoring;  Arm Cuff 1 each 0   carvedilol  (COREG ) 3.125 MG tablet Take 1 tablet (3.125 mg total) by mouth 2 (two) times daily with a meal. 180 tablet 2   doxycycline  (VIBRA -TABS) 100 MG tablet Take 1 tablet (100 mg total) by mouth 2 (two) times daily. 14 tablet 0   escitalopram (LEXAPRO) 5 MG tablet Take 5 mg by mouth daily.     levothyroxine  (SYNTHROID ) 25 MCG tablet Take 1 tablet (25 mcg total) by mouth daily before breakfast. 30 tablet 2    lidocaine -prilocaine  (EMLA ) cream Apply 1 Application topically as needed (apply 1-1.5 hours prior to port flush with lab appointment.). 30 g 2   nitroGLYCERIN  (NITROSTAT ) 0.4 MG SL tablet Place 1 tablet (0.4 mg total) under the tongue every 5 (five) minutes as needed for chest pain. 25 tablet 3   omeprazole  (PRILOSEC) 20 MG capsule Take 1 capsule (20 mg total) by mouth daily. 30 capsule 1   PROCTO-MED HC  2.5 % rectal cream Apply topically.     triamcinolone  cream (KENALOG ) 0.1 % Apply 1 Application topically 2 (two) times daily. 80 g 0   No current facility-administered medications for this visit.   Facility-Administered Medications Ordered in Other Visits  Medication Dose Route Frequency Provider Last Rate Last Admin   sodium chloride  flush (NS) 0.9 % injection 10 mL  10 mL Intracatheter Once Sherrod Sherrod, MD        SURGICAL HISTORY:  Past Surgical History:  Procedure Laterality Date   CORONARY ANGIOPLASTY WITH STENT PLACEMENT  10/30/14   STEMI s/p DES to proximal LAD; residual 80 % proximal and 60% mid LCx diease   IR IMAGING GUIDED PORT INSERTION  03/12/2022   IR IMAGING GUIDED PORT INSERTION  04/19/2024   IR PATIENT EVAL TECH 0-60 MINS  10/20/2022   IR PATIENT EVAL TECH 0-60 MINS  10/27/2022   IR PATIENT EVAL TECH 0-60 MINS  10/30/2022   IR PATIENT EVAL TECH 0-60 MINS  10/23/2022   IR PATIENT EVAL TECH 0-60 MINS  11/06/2022   IR PATIENT EVAL TECH 0-60 MINS  11/13/2022   IR PATIENT EVAL TECH 0-60 MINS  12/01/2022   IR PATIENT EVAL TECH 0-60 MINS  05/03/2024   IR RADIOLOGIST EVAL & MGMT  10/02/2022   IR RADIOLOGIST EVAL & MGMT  10/13/2022   IR RADIOLOGIST EVAL & MGMT  10/14/2022   IR RADIOLOGIST EVAL & MGMT  10/17/2022   IR RADIOLOGIST EVAL & MGMT  10/20/2022   IR REMOVAL TUN ACCESS W/ PORT W/O FL MOD SED  10/13/2022   LEFT HEART CATHETERIZATION WITH CORONARY ANGIOGRAM N/A 10/30/2014   Procedure: LEFT HEART CATHETERIZATION WITH CORONARY ANGIOGRAM;  Surgeon: Dorn JINNY Lesches, MD;  Location: [facility] CATH  LAB;  Service: Cardiovascular;  Laterality: N/A;   PERCUTANEOUS CORONARY STENT INTERVENTION (PCI-S)  10/30/2014   Procedure: PERCUTANEOUS CORONARY STENT INTERVENTION (PCI-S);  Surgeon: Dorn JINNY Lesches, MD;  Location: [facility] CATH LAB;  Service: Cardiovascular;;   PROSTATECTOMY     THORACENTESIS Left 12/31/2021   Procedure: THORACENTESIS;  Surgeon: Claudene Toribio BROCKS, MD;  Location: [facility] ENDOSCOPY;  Service: Pulmonary;  Laterality: Left;   TONSILLECTOMY     VIDEO ASSISTED THORACOSCOPY (VATS)/DECORTICATION Left 01/21/2022   Procedure: VIDEO ASSISTED THORACOSCOPY (VATS)/DECORTICATION;  Surgeon: Shyrl Linnie KIDD, MD;  Location: [facility] OR;  Service: Thoracic;  Laterality: Left;    REVIEW OF SYSTEMS:   Review of Systems  Constitutional: Stable fatigue. Negative for appetite change, chills, fever and unexpected weight change.  HENT: Negative for mouth sores, nosebleeds, sore throat and trouble swallowing.   Eyes: Negative for eye problems and icterus.  Respiratory: Stable intermittent dyspnea. Negative for cough, hemoptysis,  and wheezing.   Cardiovascular: Negative for chest pain and leg swelling.  Gastrointestinal: Negative for abdominal pain, constipation, diarrhea, nausea and vomiting.  Genitourinary: Negative for bladder incontinence, difficulty urinating, dysuria, frequency and hematuria.   Musculoskeletal: Negative for back pain, gait problem, neck pain and neck stiffness.  Skin: Negative for itching and rash.  Neurological: Negative for dizziness, extremity weakness, gait problem, headaches, light-headedness and seizures.  Hematological: Negative for adenopathy. Does not bruise/bleed easily.  Psychiatric/Behavioral: Negative for confusion, depression and sleep disturbance. The patient is not nervous/anxious.     PHYSICAL EXAMINATION:  There were no vitals taken for this visit.  ECOG PERFORMANCE STATUS: 1  Physical Exam  Constitutional: Oriented to person, place, and time and well-developed,  well-nourished, and in no distress.  HENT:  Head: Normocephalic and atraumatic.  Mouth/Throat: Oropharynx is clear and moist. No oropharyngeal exudate.  Eyes: Conjunctivae are normal. Right eye exhibits no discharge. Left eye exhibits no discharge. No scleral icterus.  Neck: Normal range of motion. Neck supple.  Cardiovascular: Normal rate, regular rhythm, normal heart sounds and intact distal pulses.   Pulmonary/Chest: Effort normal. Quiet breath sounds bilaterally. No respiratory distress. No wheezes. No rales.  Abdominal: Soft. Bowel sounds are normal. Exhibits no distension and no mass. There is no tenderness.  Musculoskeletal: Normal range of motion. Exhibits no edema. tenderness.  Lymphadenopathy:    No cervical adenopathy.  Neurological: Alert and oriented to person, place, and time. Exhibits normal muscle tone. Gait normal. Coordination normal.  Skin: Skin is warm and dry. Not diaphoretic. No erythema. No pallor.  Psychiatric: Mood, memory and judgment normal.  Vitals reviewed.  LABORATORY DATA: Lab Results  Component Value Date   WBC 8.1 07/18/2024   HGB 12.7 (L) 07/18/2024   HCT 38.7 (L) 07/18/2024   MCV 89.6 07/18/2024   PLT 174 07/18/2024      Chemistry      Component Value Date/Time   NA 144 07/18/2024 0956   K 3.9 07/18/2024 0956   CL 110 07/18/2024 0956   CO2 26 07/18/2024 0956   BUN 19 07/18/2024 0956   CREATININE 0.85 07/18/2024 0956      Component Value Date/Time   CALCIUM  8.9 07/18/2024 0956   ALKPHOS 80 07/18/2024 0956   AST 23 07/18/2024 0956   ALT 11 07/18/2024 0956   BILITOT 0.4 07/18/2024 0956       RADIOGRAPHIC STUDIES:  No results found.   ASSESSMENT/PLAN:  This is a very pleasant 83 year old Caucasian male diagnosed with extensive stage (T3, N2, M1 a) small cell lung cancer.  He presented with an obstructive left upper lobe lung mass in addition to mediastinal lymphadenopathy.  He also has a malignant left pleural effusion and  pleural-based metastases.  He also has a suspicious liver metastasis.  He was diagnosed in June 2023.    The patient is currently undergoing slightly reduced dose systemic chemotherapy with carboplatin  for an AUC of 4 on day 1, etoposide  80 mg per metered squared on days 1 2, and 3 with Imfinzi  on day 1 every 3 weeks.  He is status post 343 cycles.  Starting from cycle #5, the patient started maintenance immunotherapy with Imfinzi  IV every 4 weeks.    Labs were reviewed. Recommend  he proceed with cycle #35 today as scheduled.   See him back for follow-up visit in 4 weeks for evaluation repeat blood work before undergoing cycle #36.    He will continue prilosec for GERD.   His TSH is pending. We will adjust his dose if needed.     The patient was advised to call immediately if he has any concerning symptoms in the interval. The patient voices understanding of current disease status and treatment options and is in agreement with the current care plan. All questions were answered. The patient knows to call the clinic with any problems, questions or concerns. We can certainly see the patient much sooner if necessary    No orders of the defined types were placed in this encounter.   The total time spent in the appointment was 20-29 minutes  Nakira Litzau L Seward Coran, PA-C 08/08/2024

## 2024-08-11 ENCOUNTER — Other Ambulatory Visit: Payer: Self-pay

## 2024-08-13 ENCOUNTER — Other Ambulatory Visit: Payer: Self-pay | Admitting: Physician Assistant

## 2024-08-13 DIAGNOSIS — K219 Gastro-esophageal reflux disease without esophagitis: Secondary | ICD-10-CM

## 2024-08-13 DIAGNOSIS — E039 Hypothyroidism, unspecified: Secondary | ICD-10-CM

## 2024-08-13 DIAGNOSIS — C3482 Malignant neoplasm of overlapping sites of left bronchus and lung: Secondary | ICD-10-CM

## 2024-08-15 ENCOUNTER — Inpatient Hospital Stay

## 2024-08-15 ENCOUNTER — Inpatient Hospital Stay: Admitting: Physician Assistant

## 2024-08-15 VITALS — BP 108/41 | HR 57 | Temp 97.8°F | Resp 18 | Wt 145.5 lb

## 2024-08-15 DIAGNOSIS — Z5112 Encounter for antineoplastic immunotherapy: Secondary | ICD-10-CM | POA: Diagnosis not present

## 2024-08-15 DIAGNOSIS — C3482 Malignant neoplasm of overlapping sites of left bronchus and lung: Secondary | ICD-10-CM

## 2024-08-15 DIAGNOSIS — E032 Hypothyroidism due to medicaments and other exogenous substances: Secondary | ICD-10-CM

## 2024-08-15 LAB — CMP (CANCER CENTER ONLY)
ALT: 8 U/L (ref 0–44)
AST: 22 U/L (ref 15–41)
Albumin: 3.9 g/dL (ref 3.5–5.0)
Alkaline Phosphatase: 78 U/L (ref 38–126)
Anion gap: 8 (ref 5–15)
BUN: 18 mg/dL (ref 8–23)
CO2: 25 mmol/L (ref 22–32)
Calcium: 8.9 mg/dL (ref 8.9–10.3)
Chloride: 109 mmol/L (ref 98–111)
Creatinine: 0.92 mg/dL (ref 0.61–1.24)
GFR, Estimated: >60 mL/min
Glucose, Bld: 131 mg/dL — ABNORMAL HIGH (ref 70–99)
Potassium: 3.6 mmol/L (ref 3.5–5.1)
Sodium: 143 mmol/L (ref 135–145)
Total Bilirubin: 0.3 mg/dL (ref 0.0–1.2)
Total Protein: 6.4 g/dL — ABNORMAL LOW (ref 6.5–8.1)

## 2024-08-15 LAB — TSH: TSH: 2.22 u[IU]/mL (ref 0.350–4.500)

## 2024-08-15 LAB — CBC WITH DIFFERENTIAL (CANCER CENTER ONLY)
Abs Immature Granulocytes: 0.04 K/uL (ref 0.00–0.07)
Basophils Absolute: 0 K/uL (ref 0.0–0.1)
Basophils Relative: 0 %
Eosinophils Absolute: 0.5 K/uL (ref 0.0–0.5)
Eosinophils Relative: 5 %
HCT: 37.9 % — ABNORMAL LOW (ref 39.0–52.0)
Hemoglobin: 12.5 g/dL — ABNORMAL LOW (ref 13.0–17.0)
Immature Granulocytes: 0 %
Lymphocytes Relative: 16 %
Lymphs Abs: 1.5 K/uL (ref 0.7–4.0)
MCH: 29.1 pg (ref 26.0–34.0)
MCHC: 33 g/dL (ref 30.0–36.0)
MCV: 88.1 fL (ref 80.0–100.0)
Monocytes Absolute: 1 K/uL (ref 0.1–1.0)
Monocytes Relative: 10 %
Neutro Abs: 6.3 K/uL (ref 1.7–7.7)
Neutrophils Relative %: 69 %
Platelet Count: 179 K/uL (ref 150–400)
RBC: 4.3 MIL/uL (ref 4.22–5.81)
RDW: 14.2 % (ref 11.5–15.5)
WBC Count: 9.3 K/uL (ref 4.0–10.5)
nRBC: 0 % (ref 0.0–0.2)

## 2024-08-15 MED ORDER — SODIUM CHLORIDE 0.9 % IV SOLN: Freq: Once | INTRAVENOUS | Status: AC

## 2024-08-15 MED ORDER — SODIUM CHLORIDE 0.9 % IV SOLN
1500.0000 mg | Freq: Once | INTRAVENOUS | Status: AC
  Administered 2024-08-15: 1500 mg via INTRAVENOUS
  Filled 2024-08-15: qty 30

## 2024-08-15 NOTE — Patient Instructions (Signed)
 [facility] - A DEPT OF [facility]  Discharge Instructions: Thank you for choosing [facility] to provide your oncology and hematology care.   If you have a lab appointment with the Cancer Center, please go directly to the Cancer Center and check in at the registration area.   Wear comfortable clothing and clothing appropriate for easy access to any Portacath or PICC line.   We strive to give you quality time with your provider. You may need to reschedule your appointment if you arrive late (15 or more minutes).  Arriving late affects you and other patients whose appointments are after yours.  Also, if you miss three or more appointments without notifying the office, you may be dismissed from the clinic at the provider's discretion.      For prescription refill requests, have your pharmacy contact our office and allow 72 hours for refills to be completed.    Today you received the following chemotherapy and/or immunotherapy agents: durvalumab (IMFINZI)       To help prevent nausea and vomiting after your treatment, we encourage you to take your nausea medication as directed.  BELOW ARE SYMPTOMS THAT SHOULD BE REPORTED IMMEDIATELY: *FEVER GREATER THAN 100.4 F (38 C) OR HIGHER *CHILLS OR SWEATING *NAUSEA AND VOMITING THAT IS NOT CONTROLLED WITH YOUR NAUSEA MEDICATION *UNUSUAL SHORTNESS OF BREATH *UNUSUAL BRUISING OR BLEEDING *URINARY PROBLEMS (pain or burning when urinating, or frequent urination) *BOWEL PROBLEMS (unusual diarrhea, constipation, pain near the anus) TENDERNESS IN MOUTH AND THROAT WITH OR WITHOUT PRESENCE OF ULCERS (sore throat, sores in mouth, or a toothache) UNUSUAL RASH, SWELLING OR PAIN  UNUSUAL VAGINAL DISCHARGE OR ITCHING   Items with * indicate a potential emergency and should be followed up as soon as possible or go to the Emergency Department if any problems should occur.  Please show the CHEMOTHERAPY ALERT CARD or  IMMUNOTHERAPY ALERT CARD at check-in to the Emergency Department and triage nurse.  Should you have questions after your visit or need to cancel or reschedule your appointment, please contact [facility] - A DE[facility]  Dept: [phone]  and follow the prompts.  Office hours are 8:00 a.m. to 4:30 p.m. Monday - Friday. Please note that voicemails left after 4:00 p.m. may not be returned until the following business day.  We are closed weekends and major holidays. You have access to a nurse at all times for urgent questions. Please call the main number to the clinic Dept: [phone] and follow the prompts.   For any non-urgent questions, you may also contact your provider using MyChart. We now offer e-Visits for anyone 39 and older to request care online for non-urgent symptoms. For details visit [email or web address].   Also download the MyChart app! Go to the app store, search "MyChart", open the app, select [place], and log in with your MyChart username and password.

## 2024-08-16 ENCOUNTER — Other Ambulatory Visit: Payer: Self-pay

## 2024-09-05 ENCOUNTER — Other Ambulatory Visit: Payer: Self-pay | Admitting: Internal Medicine

## 2024-09-05 DIAGNOSIS — C3482 Malignant neoplasm of overlapping sites of left bronchus and lung: Secondary | ICD-10-CM

## 2024-09-09 ENCOUNTER — Other Ambulatory Visit: Payer: Self-pay

## 2024-09-09 NOTE — Progress Notes (Signed)
 [facility] OFFICE PROGRESS NOTE  [name], MD [address] [place] [address]  DIAGNOSIS: Extensive stage (T3, N2, M1b) small cell lung cancer presented with obstructive left upper lobe lung mass in addition to mediastinal lymphadenopathy and malignant left pleural effusion as well as pleural-based metastasis as well as suspicious liver metastasis diagnosed in June 2023   PRIOR THERAPY: Palliative radiotherapy to enlarging gastrohepatic ligament lymph node under the care of Dr. Shannon completed January 8th, 2025.   CURRENT THERAPY: Palliative systemic chemotherapy with carboplatin  initiated for AUC of 4 on day 1 and etoposide  80 Mg/M2 on days 1, 2 and 3 with Cosela  before the chemotherapy and Imfinzi  1500 Mg IV on day 1 every 3 weeks.  Status post 35 cycles.  Starting from cycle #5 the patient will be on maintenance treatment with Imfinzi  1500 Mg IV every 4 weeks.   INTERVAL HISTORY: Pedro Pope. 84 y.o. male returns to the clinic today for a follow-up visit accompanied by his wife and son.  The patient was last seen by myself 4 weeks ago.   He reports he had bronchitis in the interval. He was treated with two antibiotics and steroids. He reports this lasted about 10 days.   He denies fever, nausea, vomiting, or hemoptysis. He reports intermittent pain below the right rib cage, exacerbated by coughing but not related to eating. He describes choking episodes, most recently a significant event at his granddaughter's house. He has a diagnosis of gastroesophageal reflux disease and takes Prilosec as prescribed. He does not use antihistamines or decongestants and prefers to avoid guaifenesin. He uses sugarless cough drops as needed. He reports chronic nasal congestion, frequent nose blowing, and increased yellow mucus production Wearing a mask appears to reduce his nasal symptoms, possibly by limiting allergen exposure. He did not experience chest pain, palpitations, or  syncope during this illness. Shortness of breath during the recent infection is improving, and oxygen  saturation remains stable.   He is here today for evaluation and repeat blood work before undergoing cycle #36   MEDICAL HISTORY: Past Medical History:  Diagnosis Date   Anosmia    CAD (coronary artery disease), native coronary artery    a. 10/29/2013 antlat STEMI s/p DES to pLAD; residual 80 % proximal and 60% mid LCx diease   Chronic headaches    Dressler's syndrome (HCC)    a. placed on colchicine     ED (erectile dysfunction)    Elevated TSH    Fatty liver    Gout    Ischemic cardiomyopathy    a. 2D ECHO 11/04/14 w/ EF 40-45% with LV WMA, G2DD, mild MR, mild LA dilation, mod TR.   PAF (paroxysmal atrial fibrillation) (HCC)    a. Dx on 11/03/14 admission. Not placed on longterm AC due to DAPT w/ receent DES. Will plan for outpt heart monitor    Pre-diabetes    a. HgA1c 6.1 in 10/2014   Prostate cancer [facility])    s/p prostatectomy and XRT   Rheumatic fever 08/18/1945   Small cell lung cancer (HCC) 01/2022    ALLERGIES:  is allergic to penicillins, clindamycin, corn syrup [glucose], and aspirin  adult low [aspirin ].  MEDICATIONS:  Current Outpatient Medications  Medication Sig Dispense Refill   loratadine  (CLARITIN ) 10 MG tablet Take 1 tablet (10 mg total) by mouth daily. 30 tablet 2   acetaminophen  (TYLENOL ) 500 MG tablet Take 500 mg by mouth every 6 (six) hours as needed for mild pain or moderate pain.  albuterol  (VENTOLIN  HFA) 108 (90 Base) MCG/ACT inhaler Inhale 1-2 puffs into the lungs every 6 (six) hours as needed. 8 g 2   ascorbic acid  (VITAMIN C ) 500 MG tablet Take 500 mg by mouth 2 (two) times daily.     Blood Pressure Monitor KIT For daily blood pressure monitoring;  Arm Cuff 1 each 0   carvedilol  (COREG ) 3.125 MG tablet Take 1 tablet (3.125 mg total) by mouth 2 (two) times daily with a meal. 180 tablet 2   doxycycline  (VIBRA -TABS) 100 MG tablet Take 1 tablet (100 mg  total) by mouth 2 (two) times daily. 14 tablet 0   escitalopram (LEXAPRO) 5 MG tablet Take 5 mg by mouth daily.     levothyroxine  (SYNTHROID ) 25 MCG tablet TAKE 1 TABLET(25 MCG) BY MOUTH DAILY BEFORE BREAKFAST 90 tablet 1   lidocaine -prilocaine  (EMLA ) cream Apply 1 Application topically as needed (apply 1-1.5 hours prior to port flush with lab appointment.). 30 g 2   nitroGLYCERIN  (NITROSTAT ) 0.4 MG SL tablet Place 1 tablet (0.4 mg total) under the tongue every 5 (five) minutes as needed for chest pain. 25 tablet 3   omeprazole  (PRILOSEC) 20 MG capsule TAKE 1 CAPSULE(20 MG) BY MOUTH DAILY 90 capsule 1   PROCTO-MED HC  2.5 % rectal cream Apply topically.     triamcinolone  cream (KENALOG ) 0.1 % Apply 1 Application topically 2 (two) times daily. 80 g 0   No current facility-administered medications for this visit.   Facility-Administered Medications Ordered in Other Visits  Medication Dose Route Frequency Provider Last Rate Last Admin   durvalumab  (IMFINZI ) 1,500 mg in sodium chloride  0.9 % 100 mL chemo infusion  1,500 mg Intravenous Once Sherrod Sherrod, MD       sodium chloride  flush (NS) 0.9 % injection 10 mL  10 mL Intracatheter Once Sherrod Sherrod, MD        SURGICAL HISTORY:  Past Surgical History:  Procedure Laterality Date   CORONARY ANGIOPLASTY WITH STENT PLACEMENT  10/30/14   STEMI s/p DES to proximal LAD; residual 80 % proximal and 60% mid LCx diease   IR IMAGING GUIDED PORT INSERTION  03/12/2022   IR IMAGING GUIDED PORT INSERTION  04/19/2024   IR PATIENT EVAL TECH 0-60 MINS  10/20/2022   IR PATIENT EVAL TECH 0-60 MINS  10/27/2022   IR PATIENT EVAL TECH 0-60 MINS  10/30/2022   IR PATIENT EVAL TECH 0-60 MINS  10/23/2022   IR PATIENT EVAL TECH 0-60 MINS  11/06/2022   IR PATIENT EVAL TECH 0-60 MINS  11/13/2022   IR PATIENT EVAL TECH 0-60 MINS  12/01/2022   IR PATIENT EVAL TECH 0-60 MINS  05/03/2024   IR RADIOLOGIST EVAL & MGMT  10/02/2022   IR RADIOLOGIST EVAL & MGMT  10/13/2022   IR  RADIOLOGIST EVAL & MGMT  10/14/2022   IR RADIOLOGIST EVAL & MGMT  10/17/2022   IR RADIOLOGIST EVAL & MGMT  10/20/2022   IR REMOVAL TUN ACCESS W/ PORT W/O FL MOD SED  10/13/2022   LEFT HEART CATHETERIZATION WITH CORONARY ANGIOGRAM N/A 10/30/2014   Procedure: LEFT HEART CATHETERIZATION WITH CORONARY ANGIOGRAM;  Surgeon: Dorn JINNY Lesches, MD;  Location: [facility] CATH LAB;  Service: Cardiovascular;  Laterality: N/A;   PERCUTANEOUS CORONARY STENT INTERVENTION (PCI-S)  10/30/2014   Procedure: PERCUTANEOUS CORONARY STENT INTERVENTION (PCI-S);  Surgeon: Dorn JINNY Lesches, MD;  Location: [facility] CATH LAB;  Service: Cardiovascular;;   PROSTATECTOMY     THORACENTESIS Left 12/31/2021   Procedure: THORACENTESIS;  Surgeon: Claudene,  Toribio BROCKS, MD;  Location: [facility] ENDOSCOPY;  Service: Pulmonary;  Laterality: Left;   TONSILLECTOMY     VIDEO ASSISTED THORACOSCOPY (VATS)/DECORTICATION Left 01/21/2022   Procedure: VIDEO ASSISTED THORACOSCOPY (VATS)/DECORTICATION;  Surgeon: Shyrl Linnie KIDD, MD;  Location: [facility] OR;  Service: Thoracic;  Laterality: Left;    REVIEW OF SYSTEMS:   Review of Systems  Constitutional: Improved fatigue. Negative for appetite change, chills, fever and unexpected weight change.  HENT: Positive for chronic nasal congestion. Negative for mouth sores, nosebleeds, sore throat and trouble swallowing.   Eyes: Negative for eye problems and icterus.  Respiratory: Positive for cough. Stable dyspnea. Negative for hemoptysis and wheezing.   Cardiovascular: Negative for chest pain and leg swelling.  Gastrointestinal: Negative for abdominal pain, constipation, diarrhea, nausea and vomiting.  Genitourinary: Negative for bladder incontinence, difficulty urinating, dysuria, frequency and hematuria.   Musculoskeletal: Negative for back pain, gait problem, neck pain and neck stiffness.  Skin: Negative for itching and rash.  Neurological: Negative for dizziness, extremity weakness, gait problem, headaches, light-headedness and  seizures.  Hematological: Negative for adenopathy. Does not bruise/bleed easily.  Psychiatric/Behavioral: Negative for confusion, depression and sleep disturbance. The patient is not nervous/anxious.     PHYSICAL EXAMINATION:  Blood pressure 96/62, pulse 71, temperature 98.4 F (36.9 C), temperature source Temporal, resp. rate 17, height 5' 9 (1.753 m), weight 144 lb (65.3 kg), SpO2 100%.  ECOG PERFORMANCE STATUS: 1  Physical Exam  Constitutional: Oriented to person, place, and time and well-developed, well-nourished, and in no distress.  HENT:  Head: Normocephalic and atraumatic.  Mouth/Throat: Oropharynx is clear and moist. No oropharyngeal exudate.  Eyes: Conjunctivae are normal. Right eye exhibits no discharge. Left eye exhibits no discharge. No scleral icterus.  Neck: Normal range of motion. Neck supple.  Cardiovascular: Normal rate, regular rhythm, murmur noted and intact distal pulses.   Pulmonary/Chest: Effort normal. Quiet breath sounds bilaterally. No respiratory distress. No wheezes. No rales.  Abdominal: Soft. Bowel sounds are normal. Exhibits no distension and no mass. There is no tenderness.  Musculoskeletal: Normal range of motion. Exhibits no edema. tenderness.  Lymphadenopathy:    No cervical adenopathy.  Neurological: Alert and oriented to person, place, and time. Exhibits normal muscle tone. Gait normal. Coordination normal.  Skin: Skin is warm and dry. Not diaphoretic. No erythema. No pallor.  Psychiatric: Mood, memory and judgment normal.  Vitals reviewed.  LABORATORY DATA: Lab Results  Component Value Date   WBC 12.1 (H) 09/14/2024   HGB 13.4 09/14/2024   HCT 40.9 09/14/2024   MCV 87.6 09/14/2024   PLT 196 09/14/2024      Chemistry      Component Value Date/Time   NA 141 09/14/2024 1306   K 4.0 09/14/2024 1306   CL 105 09/14/2024 1306   CO2 26 09/14/2024 1306   BUN 24 (H) 09/14/2024 1306   CREATININE 1.00 09/14/2024 1306      Component Value  Date/Time   CALCIUM  9.2 09/14/2024 1306   ALKPHOS 81 09/14/2024 1306   AST 24 09/14/2024 1306   ALT 12 09/14/2024 1306   BILITOT 0.4 09/14/2024 1306       RADIOGRAPHIC STUDIES:  No results found.   ASSESSMENT/PLAN:  This is a very pleasant 84 year old Caucasian male diagnosed with extensive stage (T3, N2, M1 a) small cell lung cancer.  He presented with an obstructive left upper lobe lung mass in addition to mediastinal lymphadenopathy.  He also has a malignant left pleural effusion and pleural-based metastases.  He also  has a suspicious liver metastasis.  He was diagnosed in June 2023.    The patient is currently undergoing slightly reduced dose systemic chemotherapy with carboplatin  for an AUC of 4 on day 1, etoposide  80 mg per metered squared on days 1 2, and 3 with Imfinzi  on day 1 every 3 weeks.  He is status post 34 cycles.  Starting from cycle #5, the patient started maintenance immunotherapy with Imfinzi  IV every 4 weeks.    Labs were reviewed. Recommend he proceed with cycle #36 today as scheduled.  I will arrange for a repeat CT CAP prior to the next cycle of treatment.    See him back for follow-up visit in 4 weeks for evaluation repeat blood work before undergoing cycle #37.    He will continue prilosec for GERD.   Symptoms improving from his bronchitis. No fever, hemoptysis, or significant dyspnea. Oxygen  100%. He is well appearing today. - Suggested over-the-counter antihistamines (loratadine  or cetirizine) for nasal symptoms. - Discussed use of cough suppressants (dextromethorphan or cough drops) as needed. - Reinforced hydration for mucus management.  He will continue his synthroid .   The patient was advised to call immediately if she has any concerning symptoms in the interval. The patient voices understanding of current disease status and treatment options and is in agreement with the current care plan. All questions were answered. The patient knows to call the  clinic with any problems, questions or concerns. We can certainly see the patient much sooner if necessary    Orders Placed This Encounter  Procedures   CT CHEST ABDOMEN PELVIS W CONTRAST    Standing Status:   Future    Expected Date:   09/28/2024    Expiration Date:   09/14/2025    If indicated for the ordered procedure, I authorize the administration of contrast media per Radiology protocol:   Yes    Does the patient have a contrast media/X-ray dye allergy?:   No    Preferred imaging location?:   [facility]    If indicated for the ordered procedure, I authorize the administration of oral contrast media per Radiology protocol:   Yes     The total time spent in the appointment was 20-29 minutes  Sandralee Tarkington L Allisson Schindel, PA-C 09/14/24

## 2024-09-12 ENCOUNTER — Inpatient Hospital Stay: Admitting: Internal Medicine

## 2024-09-12 ENCOUNTER — Inpatient Hospital Stay

## 2024-09-12 ENCOUNTER — Other Ambulatory Visit: Payer: Self-pay

## 2024-09-12 ENCOUNTER — Inpatient Hospital Stay: Attending: Internal Medicine

## 2024-09-14 ENCOUNTER — Inpatient Hospital Stay: Attending: Internal Medicine

## 2024-09-14 ENCOUNTER — Inpatient Hospital Stay: Admitting: Physician Assistant

## 2024-09-14 ENCOUNTER — Encounter: Payer: Self-pay | Admitting: Internal Medicine

## 2024-09-14 ENCOUNTER — Other Ambulatory Visit (HOSPITAL_COMMUNITY): Payer: Self-pay

## 2024-09-14 ENCOUNTER — Inpatient Hospital Stay

## 2024-09-14 VITALS — BP 96/62 | HR 71 | Temp 98.4°F | Resp 17 | Ht 69.0 in | Wt 144.0 lb

## 2024-09-14 VITALS — BP 112/58 | HR 57 | Resp 17

## 2024-09-14 DIAGNOSIS — Z5112 Encounter for antineoplastic immunotherapy: Secondary | ICD-10-CM

## 2024-09-14 DIAGNOSIS — K219 Gastro-esophageal reflux disease without esophagitis: Secondary | ICD-10-CM | POA: Diagnosis not present

## 2024-09-14 DIAGNOSIS — C3482 Malignant neoplasm of overlapping sites of left bronchus and lung: Secondary | ICD-10-CM | POA: Diagnosis not present

## 2024-09-14 DIAGNOSIS — R0602 Shortness of breath: Secondary | ICD-10-CM | POA: Insufficient documentation

## 2024-09-14 DIAGNOSIS — J40 Bronchitis, not specified as acute or chronic: Secondary | ICD-10-CM | POA: Diagnosis not present

## 2024-09-14 DIAGNOSIS — J910 Malignant pleural effusion: Secondary | ICD-10-CM | POA: Diagnosis not present

## 2024-09-14 DIAGNOSIS — C3412 Malignant neoplasm of upper lobe, left bronchus or lung: Secondary | ICD-10-CM | POA: Insufficient documentation

## 2024-09-14 DIAGNOSIS — C782 Secondary malignant neoplasm of pleura: Secondary | ICD-10-CM | POA: Diagnosis not present

## 2024-09-14 LAB — CBC WITH DIFFERENTIAL (CANCER CENTER ONLY)
Abs Immature Granulocytes: 0.08 10*3/uL — ABNORMAL HIGH (ref 0.00–0.07)
Basophils Absolute: 0 10*3/uL (ref 0.0–0.1)
Basophils Relative: 0 %
Eosinophils Absolute: 0.4 10*3/uL (ref 0.0–0.5)
Eosinophils Relative: 3 %
HCT: 40.9 % (ref 39.0–52.0)
Hemoglobin: 13.4 g/dL (ref 13.0–17.0)
Immature Granulocytes: 1 %
Lymphocytes Relative: 18 %
Lymphs Abs: 2.1 10*3/uL (ref 0.7–4.0)
MCH: 28.7 pg (ref 26.0–34.0)
MCHC: 32.8 g/dL (ref 30.0–36.0)
MCV: 87.6 fL (ref 80.0–100.0)
Monocytes Absolute: 1 10*3/uL (ref 0.1–1.0)
Monocytes Relative: 9 %
Neutro Abs: 8.4 10*3/uL — ABNORMAL HIGH (ref 1.7–7.7)
Neutrophils Relative %: 69 %
Platelet Count: 196 10*3/uL (ref 150–400)
RBC: 4.67 MIL/uL (ref 4.22–5.81)
RDW: 14.2 % (ref 11.5–15.5)
WBC Count: 12.1 10*3/uL — ABNORMAL HIGH (ref 4.0–10.5)
nRBC: 0 % (ref 0.0–0.2)

## 2024-09-14 LAB — CMP (CANCER CENTER ONLY)
ALT: 12 U/L (ref 0–44)
AST: 24 U/L (ref 15–41)
Albumin: 3.9 g/dL (ref 3.5–5.0)
Alkaline Phosphatase: 81 U/L (ref 38–126)
Anion gap: 10 (ref 5–15)
BUN: 24 mg/dL — ABNORMAL HIGH (ref 8–23)
CO2: 26 mmol/L (ref 22–32)
Calcium: 9.2 mg/dL (ref 8.9–10.3)
Chloride: 105 mmol/L (ref 98–111)
Creatinine: 1 mg/dL (ref 0.61–1.24)
GFR, Estimated: >60 mL/min
Glucose, Bld: 96 mg/dL (ref 70–99)
Potassium: 4 mmol/L (ref 3.5–5.1)
Sodium: 141 mmol/L (ref 135–145)
Total Bilirubin: 0.4 mg/dL (ref 0.0–1.2)
Total Protein: 6.5 g/dL (ref 6.5–8.1)

## 2024-09-14 MED ORDER — SODIUM CHLORIDE 0.9 % IV SOLN
1500.0000 mg | Freq: Once | INTRAVENOUS | Status: AC
  Administered 2024-09-14: 1500 mg via INTRAVENOUS
  Filled 2024-09-14: qty 30

## 2024-09-14 MED ORDER — LORATADINE 10 MG PO TABS
10.0000 mg | ORAL_TABLET | Freq: Every day | ORAL | 2 refills | Status: AC
  Filled 2024-09-14: qty 30, 30d supply, fill #0

## 2024-09-14 MED ORDER — SODIUM CHLORIDE 0.9 % IV SOLN: Freq: Once | INTRAVENOUS | Status: AC

## 2024-09-14 NOTE — Patient Instructions (Signed)
 [facility] - A DEPT OF [place]  [facility]  Discharge Instructions: Thank you for choosing [facility] to provide your oncology and hematology care.   If you have a lab appointment with the Cancer Center, please go directly to the Cancer Center and check in at the registration area.   Wear comfortable clothing and clothing appropriate for easy access to any Portacath or PICC line.   We strive to give you quality time with your provider. You may need to reschedule your appointment if you arrive late (15 or more minutes).  Arriving late affects you and other patients whose appointments are after yours.  Also, if you miss three or more appointments without notifying the office, you may be dismissed from the clinic at the provider's discretion.      For prescription refill requests, have your pharmacy contact our office and allow 72 hours for refills to be completed.    Today you received the following chemotherapy and/or immunotherapy agents imfinzi       To help prevent nausea and vomiting after your treatment, we encourage you to take your nausea medication as directed.  BELOW ARE SYMPTOMS THAT SHOULD BE REPORTED IMMEDIATELY: *FEVER GREATER THAN 100.4 F (38 C) OR HIGHER *CHILLS OR SWEATING *NAUSEA AND VOMITING THAT IS NOT CONTROLLED WITH YOUR NAUSEA MEDICATION *UNUSUAL SHORTNESS OF BREATH *UNUSUAL BRUISING OR BLEEDING *URINARY PROBLEMS (pain or burning when urinating, or frequent urination) *BOWEL PROBLEMS (unusual diarrhea, constipation, pain near the anus) TENDERNESS IN MOUTH AND THROAT WITH OR WITHOUT PRESENCE OF ULCERS (sore throat, sores in mouth, or a toothache) UNUSUAL RASH, SWELLING OR PAIN  UNUSUAL VAGINAL DISCHARGE OR ITCHING   Items with * indicate a potential emergency and should be followed up as soon as possible or go to the Emergency Department if any problems should occur.  Please show the CHEMOTHERAPY ALERT CARD or IMMUNOTHERAPY  ALERT CARD at check-in to the Emergency Department and triage nurse.  Should you have questions after your visit or need to cancel or reschedule your appointment, please contact [facility] - A DEPT [facility]  Dept: [phone]  and follow the prompts.  Office hours are 8:00 a.m. to 4:30 p.m. Monday - Friday. Please note that voicemails left after 4:00 p.m. may not be returned until the following business day.  We are closed weekends and major holidays. You have access to a nurse at all times for urgent questions. Please call the main number to the clinic Dept: [phone] and follow the prompts.   For any non-urgent questions, you may also contact your provider using MyChart. We now offer e-Visits for anyone 34 and older to request care online for non-urgent symptoms. For details visit [email or web address].   Also download the MyChart app! Go to the app store, search MyChart, open the app, select [facility], and log in with your MyChart username and password.

## 2024-10-06 ENCOUNTER — Ambulatory Visit (HOSPITAL_COMMUNITY)

## 2024-10-10 ENCOUNTER — Inpatient Hospital Stay

## 2024-10-10 ENCOUNTER — Inpatient Hospital Stay: Admitting: Internal Medicine

## 2024-10-13 ENCOUNTER — Inpatient Hospital Stay: Admitting: Physician Assistant

## 2024-10-13 ENCOUNTER — Inpatient Hospital Stay

## 2024-10-13 ENCOUNTER — Inpatient Hospital Stay: Attending: Internal Medicine

## 2024-11-10 ENCOUNTER — Inpatient Hospital Stay

## 2024-11-10 ENCOUNTER — Inpatient Hospital Stay: Attending: Internal Medicine | Admitting: Internal Medicine

## 2024-12-07 ENCOUNTER — Inpatient Hospital Stay

## 2024-12-07 ENCOUNTER — Inpatient Hospital Stay: Admitting: Physician Assistant

## 2024-12-08 ENCOUNTER — Inpatient Hospital Stay

## 2024-12-08 ENCOUNTER — Inpatient Hospital Stay: Attending: Internal Medicine

## 2024-12-08 ENCOUNTER — Inpatient Hospital Stay: Admitting: Physician Assistant
